# Patient Record
Sex: Female | Born: 1945 | Race: Asian | Hispanic: No | Smoking: Never smoker
Health system: Southern US, Community
[De-identification: ages and names within clinical notes are randomized; demographics above are authoritative.]

## PROBLEM LIST (undated history)

## (undated) DIAGNOSIS — R338 Other retention of urine: Secondary | ICD-10-CM

## (undated) DIAGNOSIS — Z794 Long term (current) use of insulin: Secondary | ICD-10-CM

## (undated) DIAGNOSIS — I10 Essential (primary) hypertension: Secondary | ICD-10-CM

## (undated) DIAGNOSIS — E119 Type 2 diabetes mellitus without complications: Secondary | ICD-10-CM

## (undated) DIAGNOSIS — E669 Obesity, unspecified: Secondary | ICD-10-CM

## (undated) DIAGNOSIS — J302 Other seasonal allergic rhinitis: Secondary | ICD-10-CM

## (undated) DIAGNOSIS — S12690A Other displaced fracture of seventh cervical vertebra, initial encounter for closed fracture: Secondary | ICD-10-CM

## (undated) DIAGNOSIS — K5792 Diverticulitis of intestine, part unspecified, without perforation or abscess without bleeding: Secondary | ICD-10-CM

## (undated) DIAGNOSIS — J45909 Unspecified asthma, uncomplicated: Secondary | ICD-10-CM

## (undated) DIAGNOSIS — E1140 Type 2 diabetes mellitus with diabetic neuropathy, unspecified: Secondary | ICD-10-CM

## (undated) DIAGNOSIS — M1990 Unspecified osteoarthritis, unspecified site: Secondary | ICD-10-CM

## (undated) HISTORY — PX: NO PAST SURGERIES: SHX2092

---

## 1998-10-23 ENCOUNTER — Ambulatory Visit (HOSPITAL_COMMUNITY): Admission: RE | Admit: 1998-10-23 | Discharge: 1998-10-23 | Payer: Self-pay | Admitting: Internal Medicine

## 2003-10-31 ENCOUNTER — Ambulatory Visit: Payer: Self-pay | Admitting: *Deleted

## 2003-10-31 ENCOUNTER — Ambulatory Visit: Payer: Self-pay | Admitting: Nurse Practitioner

## 2004-12-03 ENCOUNTER — Ambulatory Visit: Payer: Self-pay | Admitting: Nurse Practitioner

## 2005-03-06 ENCOUNTER — Ambulatory Visit: Payer: Self-pay | Admitting: Nurse Practitioner

## 2005-03-11 ENCOUNTER — Ambulatory Visit: Payer: Self-pay | Admitting: Nurse Practitioner

## 2005-03-20 ENCOUNTER — Ambulatory Visit (HOSPITAL_COMMUNITY): Admission: RE | Admit: 2005-03-20 | Discharge: 2005-03-20 | Payer: Self-pay | Admitting: Internal Medicine

## 2005-03-25 ENCOUNTER — Ambulatory Visit: Payer: Self-pay | Admitting: Nurse Practitioner

## 2005-04-03 ENCOUNTER — Ambulatory Visit (HOSPITAL_COMMUNITY): Admission: RE | Admit: 2005-04-03 | Discharge: 2005-04-03 | Payer: Self-pay | Admitting: Family Medicine

## 2005-10-14 ENCOUNTER — Ambulatory Visit: Payer: Self-pay | Admitting: Nurse Practitioner

## 2005-11-07 ENCOUNTER — Encounter (INDEPENDENT_AMBULATORY_CARE_PROVIDER_SITE_OTHER): Payer: Self-pay | Admitting: *Deleted

## 2005-11-07 ENCOUNTER — Other Ambulatory Visit: Admission: RE | Admit: 2005-11-07 | Discharge: 2005-11-07 | Payer: Self-pay | Admitting: Nurse Practitioner

## 2005-11-07 ENCOUNTER — Ambulatory Visit: Payer: Self-pay | Admitting: Nurse Practitioner

## 2005-11-10 ENCOUNTER — Ambulatory Visit (HOSPITAL_COMMUNITY): Admission: RE | Admit: 2005-11-10 | Discharge: 2005-11-10 | Payer: Self-pay | Admitting: Internal Medicine

## 2005-11-19 ENCOUNTER — Ambulatory Visit: Payer: Self-pay | Admitting: Nurse Practitioner

## 2006-01-23 ENCOUNTER — Encounter: Admission: RE | Admit: 2006-01-23 | Discharge: 2006-01-23 | Payer: Self-pay | Admitting: Surgery

## 2006-02-02 ENCOUNTER — Ambulatory Visit (HOSPITAL_COMMUNITY): Admission: RE | Admit: 2006-02-02 | Discharge: 2006-02-02 | Payer: Self-pay | Admitting: Urology

## 2006-02-05 ENCOUNTER — Ambulatory Visit (HOSPITAL_COMMUNITY): Admission: RE | Admit: 2006-02-05 | Discharge: 2006-02-05 | Payer: Self-pay | Admitting: Urology

## 2006-05-15 ENCOUNTER — Ambulatory Visit: Payer: Self-pay | Admitting: Nurse Practitioner

## 2007-01-29 ENCOUNTER — Ambulatory Visit: Payer: Self-pay | Admitting: Nurse Practitioner

## 2007-01-29 LAB — CONVERTED CEMR LAB
ALT: 18 units/L (ref 0–35)
AST: 23 units/L (ref 0–37)
Alkaline Phosphatase: 74 units/L (ref 39–117)
BUN: 26 mg/dL — ABNORMAL HIGH (ref 6–23)
CO2: 28 meq/L (ref 19–32)
Calcium: 9.7 mg/dL (ref 8.4–10.5)
Chloride: 98 meq/L (ref 96–112)
Cholesterol: 193 mg/dL (ref 0–200)
Creatinine, Ser: 1.06 mg/dL (ref 0.40–1.20)
Eosinophils Relative: 15 % — ABNORMAL HIGH (ref 0–5)
MCV: 88.8 fL (ref 78.0–100.0)
Neutro Abs: 6 10*3/uL (ref 1.7–7.7)
Platelets: 268 10*3/uL (ref 150–400)
Total CHOL/HDL Ratio: 4
Total Protein: 9.2 g/dL — ABNORMAL HIGH (ref 6.0–8.3)
Triglycerides: 204 mg/dL — ABNORMAL HIGH (ref <150)

## 2007-05-28 ENCOUNTER — Emergency Department (HOSPITAL_COMMUNITY): Admission: EM | Admit: 2007-05-28 | Discharge: 2007-05-28 | Payer: Self-pay | Admitting: Emergency Medicine

## 2007-06-13 ENCOUNTER — Ambulatory Visit: Payer: Self-pay | Admitting: Internal Medicine

## 2007-06-13 ENCOUNTER — Inpatient Hospital Stay (HOSPITAL_COMMUNITY): Admission: EM | Admit: 2007-06-13 | Discharge: 2007-06-17 | Payer: Self-pay | Admitting: Emergency Medicine

## 2007-07-21 ENCOUNTER — Ambulatory Visit: Payer: Self-pay | Admitting: Internal Medicine

## 2007-08-22 ENCOUNTER — Emergency Department (HOSPITAL_COMMUNITY): Admission: EM | Admit: 2007-08-22 | Discharge: 2007-08-22 | Payer: Self-pay | Admitting: Emergency Medicine

## 2007-09-17 ENCOUNTER — Ambulatory Visit: Payer: Self-pay | Admitting: Internal Medicine

## 2007-09-17 DIAGNOSIS — J45909 Unspecified asthma, uncomplicated: Secondary | ICD-10-CM | POA: Insufficient documentation

## 2007-09-17 DIAGNOSIS — I10 Essential (primary) hypertension: Secondary | ICD-10-CM | POA: Insufficient documentation

## 2007-11-24 ENCOUNTER — Encounter (INDEPENDENT_AMBULATORY_CARE_PROVIDER_SITE_OTHER): Payer: Self-pay | Admitting: Internal Medicine

## 2007-11-24 ENCOUNTER — Ambulatory Visit: Payer: Self-pay | Admitting: Internal Medicine

## 2007-11-24 LAB — CONVERTED CEMR LAB
ALT: 14 units/L (ref 0–35)
Alkaline Phosphatase: 87 units/L (ref 39–117)
BUN: 25 mg/dL — ABNORMAL HIGH (ref 6–23)
CO2: 24 meq/L (ref 19–32)
Calcium: 9.5 mg/dL (ref 8.4–10.5)
Chloride: 99 meq/L (ref 96–112)
Creatinine, Ser: 0.95 mg/dL (ref 0.40–1.20)
Glucose, Bld: 277 mg/dL — ABNORMAL HIGH (ref 70–99)
Potassium: 3.9 meq/L (ref 3.5–5.3)
Sodium: 138 meq/L (ref 135–145)
Total Bilirubin: 0.4 mg/dL (ref 0.3–1.2)
Total Protein: 8.2 g/dL (ref 6.0–8.3)

## 2007-12-28 ENCOUNTER — Ambulatory Visit: Payer: Self-pay | Admitting: Internal Medicine

## 2008-03-16 ENCOUNTER — Emergency Department (HOSPITAL_COMMUNITY): Admission: EM | Admit: 2008-03-16 | Discharge: 2008-03-17 | Payer: Self-pay | Admitting: Emergency Medicine

## 2008-03-30 ENCOUNTER — Ambulatory Visit: Payer: Self-pay | Admitting: Internal Medicine

## 2008-04-26 ENCOUNTER — Ambulatory Visit: Payer: Self-pay | Admitting: Internal Medicine

## 2008-06-26 ENCOUNTER — Ambulatory Visit: Payer: Self-pay | Admitting: Internal Medicine

## 2008-06-26 ENCOUNTER — Encounter (INDEPENDENT_AMBULATORY_CARE_PROVIDER_SITE_OTHER): Payer: Self-pay | Admitting: Internal Medicine

## 2008-06-26 LAB — CONVERTED CEMR LAB
HDL: 40 mg/dL (ref >39)
Triglycerides: 415 mg/dL — ABNORMAL HIGH (ref <150)

## 2008-08-05 ENCOUNTER — Emergency Department (HOSPITAL_COMMUNITY): Admission: EM | Admit: 2008-08-05 | Discharge: 2008-08-05 | Payer: Self-pay | Admitting: Emergency Medicine

## 2008-10-06 ENCOUNTER — Encounter (INDEPENDENT_AMBULATORY_CARE_PROVIDER_SITE_OTHER): Payer: Self-pay | Admitting: Internal Medicine

## 2008-10-06 ENCOUNTER — Ambulatory Visit: Payer: Self-pay | Admitting: Internal Medicine

## 2008-10-06 LAB — CONVERTED CEMR LAB
AST: 20 units/L (ref 0–37)
Albumin: 4.1 g/dL (ref 3.5–5.2)
Chloride: 107 meq/L (ref 96–112)
Creatinine, Ser: 1.12 mg/dL (ref 0.40–1.20)
Hemoglobin: 11.9 g/dL — ABNORMAL LOW (ref 12.0–15.0)
Lymphs Abs: 2.9 10*3/uL (ref 0.7–4.0)
MCV: 90.1 fL (ref 78.0–100.0)
Neutrophils Relative %: 52 % (ref 43–77)

## 2008-11-27 ENCOUNTER — Ambulatory Visit: Payer: Self-pay | Admitting: Family Medicine

## 2009-01-04 ENCOUNTER — Ambulatory Visit: Payer: Self-pay | Admitting: Internal Medicine

## 2009-04-02 ENCOUNTER — Ambulatory Visit: Payer: Self-pay | Admitting: Internal Medicine

## 2009-05-31 ENCOUNTER — Ambulatory Visit: Payer: Self-pay | Admitting: Internal Medicine

## 2009-09-20 ENCOUNTER — Emergency Department (HOSPITAL_COMMUNITY): Admission: EM | Admit: 2009-09-20 | Discharge: 2009-09-20 | Payer: Self-pay | Admitting: Emergency Medicine

## 2009-10-01 ENCOUNTER — Emergency Department (HOSPITAL_COMMUNITY): Admission: EM | Admit: 2009-10-01 | Discharge: 2009-10-01 | Payer: Self-pay | Admitting: Emergency Medicine

## 2010-01-10 ENCOUNTER — Encounter (INDEPENDENT_AMBULATORY_CARE_PROVIDER_SITE_OTHER): Payer: Self-pay | Admitting: *Deleted

## 2010-01-10 LAB — CONVERTED CEMR LAB
AST: 20 units/L (ref 0–37)
Albumin: 4.9 g/dL (ref 3.5–5.2)
Alkaline Phosphatase: 59 units/L (ref 39–117)
BUN: 31 mg/dL — ABNORMAL HIGH (ref 6–23)
CO2: 27 meq/L (ref 19–32)
Creatinine, Ser: 1.17 mg/dL (ref 0.40–1.20)
Glucose, Bld: 114 mg/dL — ABNORMAL HIGH (ref 70–99)
Sodium: 140 meq/L (ref 135–145)
Total Bilirubin: 0.4 mg/dL (ref 0.3–1.2)
Total Protein: 8.3 g/dL (ref 6.0–8.3)

## 2010-01-27 ENCOUNTER — Encounter: Payer: Self-pay | Admitting: Internal Medicine

## 2010-03-20 ENCOUNTER — Emergency Department (HOSPITAL_COMMUNITY)
Admission: EM | Admit: 2010-03-20 | Discharge: 2010-03-20 | Disposition: A | Discharge status: Home / Self Care | Payer: Medicare Other | Attending: Emergency Medicine | Admitting: Emergency Medicine

## 2010-03-20 ENCOUNTER — Emergency Department (HOSPITAL_COMMUNITY): Payer: Medicare Other

## 2010-03-20 DIAGNOSIS — J45909 Unspecified asthma, uncomplicated: Secondary | ICD-10-CM | POA: Insufficient documentation

## 2010-03-20 DIAGNOSIS — R0789 Other chest pain: Secondary | ICD-10-CM | POA: Insufficient documentation

## 2010-03-20 DIAGNOSIS — R0989 Other specified symptoms and signs involving the circulatory and respiratory systems: Secondary | ICD-10-CM | POA: Insufficient documentation

## 2010-03-20 DIAGNOSIS — R05 Cough: Secondary | ICD-10-CM | POA: Insufficient documentation

## 2010-03-20 DIAGNOSIS — K219 Gastro-esophageal reflux disease without esophagitis: Secondary | ICD-10-CM | POA: Insufficient documentation

## 2010-03-20 DIAGNOSIS — J40 Bronchitis, not specified as acute or chronic: Secondary | ICD-10-CM | POA: Insufficient documentation

## 2010-03-20 DIAGNOSIS — E119 Type 2 diabetes mellitus without complications: Secondary | ICD-10-CM | POA: Insufficient documentation

## 2010-03-20 DIAGNOSIS — I498 Other specified cardiac arrhythmias: Secondary | ICD-10-CM | POA: Insufficient documentation

## 2010-03-20 DIAGNOSIS — R0609 Other forms of dyspnea: Secondary | ICD-10-CM | POA: Insufficient documentation

## 2010-03-20 DIAGNOSIS — R059 Cough, unspecified: Secondary | ICD-10-CM | POA: Insufficient documentation

## 2010-03-20 DIAGNOSIS — R5383 Other fatigue: Secondary | ICD-10-CM | POA: Insufficient documentation

## 2010-03-20 DIAGNOSIS — J3489 Other specified disorders of nose and nasal sinuses: Secondary | ICD-10-CM | POA: Insufficient documentation

## 2010-03-20 DIAGNOSIS — I10 Essential (primary) hypertension: Secondary | ICD-10-CM | POA: Insufficient documentation

## 2010-03-20 DIAGNOSIS — R5381 Other malaise: Secondary | ICD-10-CM | POA: Insufficient documentation

## 2010-03-20 LAB — CBC
Hemoglobin: 12.1 g/dL (ref 12.0–15.0)
MCH: 29.4 pg (ref 26.0–34.0)
MCHC: 33.9 g/dL (ref 30.0–36.0)

## 2010-03-20 LAB — POCT CARDIAC MARKERS
CKMB, poc: 1.6 ng/mL (ref 1.0–8.0)
Myoglobin, poc: 231 ng/mL (ref 12–200)

## 2010-03-20 LAB — POCT I-STAT 3, VENOUS BLOOD GAS (G3P V): pCO2, Ven: 49.9 mmHg (ref 45.0–50.0)

## 2010-03-20 LAB — BRAIN NATRIURETIC PEPTIDE: Pro B Natriuretic peptide (BNP): <30 pg/mL (ref 0.0–100.0)

## 2010-03-20 LAB — POCT I-STAT, CHEM 8
BUN: 38 mg/dL — ABNORMAL HIGH (ref 6–23)
Calcium, Ion: 1.11 mmol/L — ABNORMAL LOW (ref 1.12–1.32)
Chloride: 103 mEq/L (ref 96–112)
HCT: 38 % (ref 36.0–46.0)
Hemoglobin: 12.9 g/dL (ref 12.0–15.0)
Potassium: 4.4 mEq/L (ref 3.5–5.1)
Sodium: 139 mEq/L (ref 135–145)

## 2010-03-20 LAB — DIFFERENTIAL
Eosinophils Relative: 14 % — ABNORMAL HIGH (ref 0–5)
Monocytes Absolute: 0.7 10*3/uL (ref 0.1–1.0)
Monocytes Relative: 6 % (ref 3–12)
Neutro Abs: 4.8 10*3/uL (ref 1.7–7.7)

## 2010-03-21 LAB — BASIC METABOLIC PANEL
BUN: 19 mg/dL (ref 6–23)
Calcium: 9.3 mg/dL (ref 8.4–10.5)
Creatinine, Ser: 1.25 mg/dL — ABNORMAL HIGH (ref 0.4–1.2)
GFR calc non Af Amer: 43 mL/min — ABNORMAL LOW (ref >60)
Glucose, Bld: 171 mg/dL — ABNORMAL HIGH (ref 70–99)
Sodium: 139 mEq/L (ref 135–145)

## 2010-03-21 LAB — CBC
MCH: 30.2 pg (ref 26.0–34.0)
MCHC: 34.9 g/dL (ref 30.0–36.0)
Platelets: 209 10*3/uL (ref 150–400)
RDW: 11.9 % (ref 11.5–15.5)

## 2010-03-21 LAB — URINE CULTURE
Colony Count: 9000
Culture  Setup Time: 201109151420

## 2010-03-21 LAB — POCT I-STAT 3, VENOUS BLOOD GAS (G3P V)
Bicarbonate: 30.1 mEq/L — ABNORMAL HIGH (ref 20.0–24.0)
TCO2: 32 mmol/L (ref 0–100)
pH, Ven: 7.375 — ABNORMAL HIGH (ref 7.250–7.300)

## 2010-03-21 LAB — URINALYSIS, ROUTINE W REFLEX MICROSCOPIC
Bilirubin Urine: NEGATIVE
Glucose, UA: 100 mg/dL — AB
Hgb urine dipstick: NEGATIVE
Specific Gravity, Urine: 1.013 (ref 1.005–1.030)
Urobilinogen, UA: 0.2 mg/dL (ref 0.0–1.0)

## 2010-03-21 LAB — POCT CARDIAC MARKERS

## 2010-03-21 LAB — DIFFERENTIAL
Basophils Absolute: 0.1 10*3/uL (ref 0.0–0.1)
Basophils Relative: 1 % (ref 0–1)
Eosinophils Absolute: 1.3 10*3/uL — ABNORMAL HIGH (ref 0.0–0.7)
Neutro Abs: 5.2 10*3/uL (ref 1.7–7.7)
Neutrophils Relative %: 51 % (ref 43–77)

## 2010-03-21 LAB — LACTIC ACID, PLASMA: Lactic Acid, Venous: 1.5 mmol/L (ref 0.5–2.2)

## 2010-03-24 ENCOUNTER — Emergency Department (HOSPITAL_COMMUNITY): Payer: Medicare Other

## 2010-03-24 ENCOUNTER — Emergency Department (HOSPITAL_COMMUNITY)
Admission: EM | Admit: 2010-03-24 | Discharge: 2010-03-24 | Disposition: A | Discharge status: Home / Self Care | Payer: Medicare Other | Attending: Emergency Medicine | Admitting: Emergency Medicine

## 2010-03-24 DIAGNOSIS — J45909 Unspecified asthma, uncomplicated: Secondary | ICD-10-CM | POA: Insufficient documentation

## 2010-03-24 DIAGNOSIS — E119 Type 2 diabetes mellitus without complications: Secondary | ICD-10-CM | POA: Insufficient documentation

## 2010-03-24 DIAGNOSIS — R05 Cough: Secondary | ICD-10-CM | POA: Insufficient documentation

## 2010-03-24 DIAGNOSIS — I10 Essential (primary) hypertension: Secondary | ICD-10-CM | POA: Insufficient documentation

## 2010-03-24 DIAGNOSIS — R059 Cough, unspecified: Secondary | ICD-10-CM | POA: Insufficient documentation

## 2010-03-24 DIAGNOSIS — J40 Bronchitis, not specified as acute or chronic: Secondary | ICD-10-CM | POA: Insufficient documentation

## 2010-03-24 DIAGNOSIS — J189 Pneumonia, unspecified organism: Secondary | ICD-10-CM | POA: Insufficient documentation

## 2010-03-24 DIAGNOSIS — K219 Gastro-esophageal reflux disease without esophagitis: Secondary | ICD-10-CM | POA: Insufficient documentation

## 2010-03-24 DIAGNOSIS — R0602 Shortness of breath: Secondary | ICD-10-CM | POA: Insufficient documentation

## 2010-03-31 ENCOUNTER — Emergency Department (HOSPITAL_COMMUNITY)
Admission: EM | Admit: 2010-03-31 | Discharge: 2010-03-31 | Disposition: A | Discharge status: Home / Self Care | Payer: Medicare Other | Attending: Emergency Medicine | Admitting: Emergency Medicine

## 2010-03-31 ENCOUNTER — Emergency Department (HOSPITAL_COMMUNITY): Payer: Medicare Other

## 2010-03-31 DIAGNOSIS — R42 Dizziness and giddiness: Secondary | ICD-10-CM | POA: Insufficient documentation

## 2010-03-31 DIAGNOSIS — K219 Gastro-esophageal reflux disease without esophagitis: Secondary | ICD-10-CM | POA: Insufficient documentation

## 2010-03-31 DIAGNOSIS — R413 Other amnesia: Secondary | ICD-10-CM | POA: Insufficient documentation

## 2010-03-31 DIAGNOSIS — Z79899 Other long term (current) drug therapy: Secondary | ICD-10-CM | POA: Insufficient documentation

## 2010-03-31 DIAGNOSIS — R0602 Shortness of breath: Secondary | ICD-10-CM | POA: Insufficient documentation

## 2010-03-31 DIAGNOSIS — G8929 Other chronic pain: Secondary | ICD-10-CM | POA: Insufficient documentation

## 2010-03-31 DIAGNOSIS — F0390 Unspecified dementia without behavioral disturbance: Secondary | ICD-10-CM | POA: Insufficient documentation

## 2010-03-31 DIAGNOSIS — R05 Cough: Secondary | ICD-10-CM | POA: Insufficient documentation

## 2010-03-31 DIAGNOSIS — F29 Unspecified psychosis not due to a substance or known physiological condition: Secondary | ICD-10-CM | POA: Insufficient documentation

## 2010-03-31 DIAGNOSIS — J45909 Unspecified asthma, uncomplicated: Secondary | ICD-10-CM | POA: Insufficient documentation

## 2010-03-31 DIAGNOSIS — I10 Essential (primary) hypertension: Secondary | ICD-10-CM | POA: Insufficient documentation

## 2010-03-31 DIAGNOSIS — N289 Disorder of kidney and ureter, unspecified: Secondary | ICD-10-CM | POA: Insufficient documentation

## 2010-03-31 DIAGNOSIS — R0609 Other forms of dyspnea: Secondary | ICD-10-CM | POA: Insufficient documentation

## 2010-03-31 DIAGNOSIS — E119 Type 2 diabetes mellitus without complications: Secondary | ICD-10-CM | POA: Insufficient documentation

## 2010-03-31 DIAGNOSIS — R0989 Other specified symptoms and signs involving the circulatory and respiratory systems: Secondary | ICD-10-CM | POA: Insufficient documentation

## 2010-03-31 DIAGNOSIS — R059 Cough, unspecified: Secondary | ICD-10-CM | POA: Insufficient documentation

## 2010-03-31 LAB — BASIC METABOLIC PANEL
BUN: 21 mg/dL (ref 6–23)
CO2: 25 mEq/L (ref 19–32)
GFR calc non Af Amer: 28 mL/min — ABNORMAL LOW (ref >60)
Glucose, Bld: 390 mg/dL — ABNORMAL HIGH (ref 70–99)
Potassium: 4.4 mEq/L (ref 3.5–5.1)
Sodium: 134 mEq/L — ABNORMAL LOW (ref 135–145)

## 2010-03-31 LAB — DIFFERENTIAL
Basophils Absolute: 0.1 10*3/uL (ref 0.0–0.1)
Eosinophils Relative: 8 % — ABNORMAL HIGH (ref 0–5)
Lymphocytes Relative: 25 % (ref 12–46)
Lymphs Abs: 2.6 10*3/uL (ref 0.7–4.0)
Monocytes Absolute: 0.6 10*3/uL (ref 0.1–1.0)
Neutro Abs: 6.3 10*3/uL (ref 1.7–7.7)

## 2010-03-31 LAB — CBC
HCT: 37.8 % (ref 36.0–46.0)
Hemoglobin: 13.5 g/dL (ref 12.0–15.0)
MCV: 84.4 fL (ref 78.0–100.0)
RDW: 11.6 % (ref 11.5–15.5)
WBC: 10.6 10*3/uL — ABNORMAL HIGH (ref 4.0–10.5)

## 2010-04-14 LAB — URINALYSIS, ROUTINE W REFLEX MICROSCOPIC
Bilirubin Urine: NEGATIVE
Glucose, UA: NEGATIVE mg/dL
Hgb urine dipstick: NEGATIVE
Ketones, ur: NEGATIVE mg/dL
Protein, ur: NEGATIVE mg/dL
Urobilinogen, UA: 0.2 mg/dL (ref 0.0–1.0)

## 2010-04-14 LAB — CBC
HCT: 32.8 % — ABNORMAL LOW (ref 36.0–46.0)
Hemoglobin: 11.4 g/dL — ABNORMAL LOW (ref 12.0–15.0)
MCHC: 34.7 g/dL (ref 30.0–36.0)
MCV: 89.4 fL (ref 78.0–100.0)
RBC: 3.66 MIL/uL — ABNORMAL LOW (ref 3.87–5.11)
WBC: 7.5 10*3/uL (ref 4.0–10.5)

## 2010-04-14 LAB — POCT I-STAT, CHEM 8
BUN: 47 mg/dL — ABNORMAL HIGH (ref 6–23)
Calcium, Ion: 1.13 mmol/L (ref 1.12–1.32)
Creatinine, Ser: 2.5 mg/dL — ABNORMAL HIGH (ref 0.4–1.2)
Glucose, Bld: 182 mg/dL — ABNORMAL HIGH (ref 70–99)
Hemoglobin: 11.2 g/dL — ABNORMAL LOW (ref 12.0–15.0)
TCO2: 19 mmol/L (ref 0–100)

## 2010-04-14 LAB — URINE MICROSCOPIC-ADD ON

## 2010-04-14 LAB — CALCIUM: Calcium: 8.9 mg/dL (ref 8.4–10.5)

## 2010-04-18 LAB — POCT CARDIAC MARKERS
CKMB, poc: 1.5 ng/mL (ref 1.0–8.0)
Myoglobin, poc: 159 ng/mL (ref 12–200)
Myoglobin, poc: 210 ng/mL (ref 12–200)

## 2010-04-18 LAB — COMPREHENSIVE METABOLIC PANEL
BUN: 26 mg/dL — ABNORMAL HIGH (ref 6–23)
Calcium: 9.6 mg/dL (ref 8.4–10.5)
Glucose, Bld: 100 mg/dL — ABNORMAL HIGH (ref 70–99)
Sodium: 138 mEq/L (ref 135–145)
Total Protein: 8.5 g/dL — ABNORMAL HIGH (ref 6.0–8.3)

## 2010-04-18 LAB — POCT I-STAT, CHEM 8
BUN: 28 mg/dL — ABNORMAL HIGH (ref 6–23)
Creatinine, Ser: 1.2 mg/dL (ref 0.4–1.2)
Potassium: 3.7 mEq/L (ref 3.5–5.1)
Sodium: 141 mEq/L (ref 135–145)

## 2010-04-18 LAB — DIFFERENTIAL
Lymphocytes Relative: 33 % (ref 12–46)
Lymphs Abs: 3.4 10*3/uL (ref 0.7–4.0)
Monocytes Relative: 5 % (ref 3–12)
Neutro Abs: 5.7 10*3/uL (ref 1.7–7.7)
Neutrophils Relative %: 54 % (ref 43–77)

## 2010-04-18 LAB — CBC
Hemoglobin: 12.9 g/dL (ref 12.0–15.0)
MCHC: 35.2 g/dL (ref 30.0–36.0)
RDW: 12.6 % (ref 11.5–15.5)

## 2010-04-18 LAB — GLUCOSE, CAPILLARY

## 2010-05-13 ENCOUNTER — Observation Stay (HOSPITAL_COMMUNITY)
Admission: EM | Admit: 2010-05-13 | Discharge: 2010-05-13 | Disposition: A | Discharge status: Home / Self Care | Payer: Medicare Other | Attending: Emergency Medicine | Admitting: Emergency Medicine

## 2010-05-13 ENCOUNTER — Emergency Department (HOSPITAL_COMMUNITY): Payer: Medicare Other

## 2010-05-13 DIAGNOSIS — R7309 Other abnormal glucose: Secondary | ICD-10-CM | POA: Insufficient documentation

## 2010-05-13 DIAGNOSIS — J4489 Other specified chronic obstructive pulmonary disease: Principal | ICD-10-CM | POA: Insufficient documentation

## 2010-05-13 DIAGNOSIS — R062 Wheezing: Secondary | ICD-10-CM | POA: Insufficient documentation

## 2010-05-13 DIAGNOSIS — J449 Chronic obstructive pulmonary disease, unspecified: Secondary | ICD-10-CM | POA: Insufficient documentation

## 2010-05-13 LAB — GLUCOSE, CAPILLARY
Glucose-Capillary: 507 mg/dL — ABNORMAL HIGH (ref 70–99)
Glucose-Capillary: 468 mg/dL — ABNORMAL HIGH (ref 70–99)
Glucose-Capillary: 466 mg/dL — ABNORMAL HIGH (ref 70–99)
Glucose-Capillary: 462 mg/dL — ABNORMAL HIGH (ref 70–99)
Glucose-Capillary: 364 mg/dL — ABNORMAL HIGH (ref 70–99)
Glucose-Capillary: 233 mg/dL — ABNORMAL HIGH (ref 70–99)

## 2010-05-13 LAB — POCT I-STAT, CHEM 8
BUN: 19 mg/dL (ref 6–23)
Creatinine, Ser: 1.4 mg/dL — ABNORMAL HIGH (ref 0.4–1.2)
Glucose, Bld: 304 mg/dL — ABNORMAL HIGH (ref 70–99)
Hemoglobin: 12.6 g/dL (ref 12.0–15.0)
Potassium: 3.7 mEq/L (ref 3.5–5.1)
TCO2: 25 mmol/L (ref 0–100)

## 2010-05-13 LAB — CBC
HCT: 34.4 % — ABNORMAL LOW (ref 36.0–46.0)
MCH: 29.8 pg (ref 26.0–34.0)
MCHC: 34.6 g/dL (ref 30.0–36.0)
RDW: 12.1 % (ref 11.5–15.5)

## 2010-05-13 LAB — DIFFERENTIAL
Basophils Absolute: 0.1 10*3/uL (ref 0.0–0.1)
Eosinophils Relative: 11 % — ABNORMAL HIGH (ref 0–5)
Lymphocytes Relative: 24 % (ref 12–46)
Monocytes Absolute: 0.4 10*3/uL (ref 0.1–1.0)
Monocytes Relative: 5 % (ref 3–12)

## 2010-05-21 NOTE — H&P (Signed)
NAME:  Nichole Munoz, Nichole Munoz NO.:  [id number]   MEDICAL RECORD NO.:  [id number]          PATIENT TYPE:  INP   LOCATION:  5507                         [facility]  [facility]   PHYSICIAN:  Jana Hakim, M.D. DATE OF BIRTH:  Nov 23, 1945   DATE OF ADMISSION:  06/13/2007  DATE OF DISCHARGE:                              HISTORY & PHYSICAL   PRIMARY CARE PHYSICIAN:  [facility].   CHIEF COMPLAINT:  Coughing up blood.   HISTORY OF PRESENT ILLNESS:  This is a 65 year old female presenting to  the emergency department with complaints of cough, shortness of breath  and hemoptysis for the past 4-6 months.  The patient's son in the  bedside and assists with giving the medical history.  The patient  reports having weight loss, fevers, chills and night sweats.  The  patient denies having any previous history of tuberculosis.  The patient  has lived in the [place] since 1990 and originally is from  [place].   PAST MEDICAL HISTORY:  Significant for:  1. Diabetes type 2.  2. Gastroesophageal reflux disease.  3. Hypertension.  4. Neuropathy.   MEDICATIONS AT THIS TIME:  Include metformin, Naprosyn, omeprazole,  lorazepam, hydrochlorothiazide and butalbital/acetaminophen.   ALLERGIES:  SHE HAS NO KNOWN DRUG ALLERGIES.   SOCIAL HISTORY:  The patient lives with relatives.  She is a nonsmoker,  nondrinker.   FAMILY HISTORY:  Noncontributory.   REVIEW OF SYSTEMS:  Pertinents are mentioned above.  The patient has  also had decreased appetite.  She has not had any nausea, vomiting,  diarrhea or chest pain.  She has not had dizziness or syncope.   PHYSICAL EXAMINATION:  GENERAL:  This is a 65 year old well-nourished,  well-developed female in no acute distress.  VITAL SIGNS: Temperature of 100.4, blood pressure 154/90, heart rate 80,  respirations 16, O2 saturation 100%.  HEENT:  Normocephalic, atraumatic.  Pupils are equally round and  reactive to light.  Extraocular muscles  are intact.  There is no scleral  icterus.  Funduscopic benign.  Oropharynx is clear.  NECK: Supple.  Full range of motion.  No thyromegaly, adenopathy or  jugular venous distention.  MUSCULOSKELETAL:  Chest wall area has numerous erythematous, excoriated  tracks consistent with cultural coining.  CARDIAC:  Regular rate and  rhythm.  No murmurs, gallops or rubs.  LUNGS: Clear to auscultation bilaterally.  No rales, rhonchi or wheezes  are appreciated.  ABDOMEN:  Positive bowel sounds.  Soft, nontender and nondistended.  EXTREMITIES: Without cyanosis, clubbing or edema.  NEUROLOGIC:  Examination nonfocal.   LABORATORY STUDIES:  White blood cell count 13.0, hemoglobin 12.6,  hematocrit 35.6, platelets 244,000, neutrophils 62%, lymphocytes 21%,  monocytes 5%, eosinophils 12%.  Sodium 136, potassium 3.3, chloride 100,  carbon dioxide 26, BUN 22, creatinine 1.02 and glucose 374.  LDH 152.   Chest x-ray reveals no acute cardiopulmonary disease findings.   ASSESSMENT:  [age]year-old female being admitted with:  1. Shortness of breath.  2. Chronic cough and hemoptysis.  3. Type 2 diabetes mellitus.  4. Hypertension.   PLAN:  The patient  will be admitted to a respiratory isolation area and  be evaluated for possible tuberculosis.  A PPD will be placed and read  in 48 hours.  A TB workup has been started.  Sputum for AFB q.a.m. x3  has been ordered along with sputum for C&S and tuberculosis culture.  However, the patient does have a normal-appearing chest x-ray and the  etiology of her hemoptysis and symptoms may be secondary to other causes  such as neoplastic processes, sarcoidosis, parasitic etiologies, which  are cited because the patient does have an eosinophilia.  The patient  will continue on her hydrochlorothiazide and sliding-scale insulin  coverage has been ordered and potassium replacement therapy.  Metformin  will be held for now.  DVT and GI prophylaxis have also been  ordered and  supplemental oxygen therapy.  Further workup will ensue pending results  of the patient's studies.      Jana Hakim, M.D.  Electronically Signed     HJ/MEDQ  D:  06/13/2007  T:  06/13/2007  Job:  [id number]

## 2010-05-21 NOTE — Discharge Summary (Signed)
NAME:  Nichole Munoz, SPIEKER NO.:  [id number]   MEDICAL RECORD NO.:  [id number]          PATIENT TYPE:  INP   LOCATION:  [id number]                         FACILITY:  [facility]   PHYSICIAN:  Leana Gamer, MDDATE OF BIRTH:  1945-01-18   DATE OF ADMISSION:  06/13/2007  DATE OF DISCHARGE:  06/17/2007                               DISCHARGE SUMMARY   DISCHARGE DISPOSITION:  Home.   FINAL DISCHARGE DIAGNOSIS:  1. Reactive airway disease.  2. Hemoptysis, resolved.  3. Diabetes type 2.  4. Bronchitis.  5. Gastroesophageal reflux disease.  6. Chronic pain.   DISCHARGE MEDICATIONS:  1. Symbicort 2 puffs p.o. b.i.d.  2. Avelox 400 mg p.o. daily x2.  3. Mucinex DM 2 tabs p.o. b.i.d.  4. Potassium chloride 20 mEq p.o. daily.  5. Lorazepam 0.5 mg p.o. at bedtime.  6. Glipizide ER 10 mg p.o. daily.  7. Omeprazole 20 mg p.o. daily.  8. Hydrochlorothiazide 25 mg p.o. daily.  9. Butal/APAP 325 q.4-6 h. p.r.n.  10.Naproxen 500 mg p.o. b.i.d.  11.Advil 200 mg p.o. q.6 h. p.r.n.   CONSULTANTS:  Dr. Melvyn Novas, pulmonology.   PROCEDURES:  None.   Chest x-ray. two view, done on January 13, 2007, which shows no acute  cardiopulmonary disease.  Please note CT angiogram was done on May 28, 2007 and reviewed.  That was negative for PE, mild diffuse ectasia of  the thoracic aorta, negative for acute pulmonary findings.   PERTINENT LABORATORY STUDIES:  The patient had sputum sent for AFB, all  of which were negative x3.  Respiratory culture was sent and shows  normal oropharyngeal flora.  Blood cultures were negative for any growth  to date.   ALLERGIES:  NO KNOWN DRUG ALLERGIES.   CODE STATUS:  Full code.   HEALTH CARE Celese Banner:  [facility].   CHIEF COMPLAINT:  Hemoptysis and dyspnea.   HISTORY OF PRESENT ILLNESS:  Please see the H&P dictated by Dr. Jana Hakim for details of the HPI.  However, this is a 65 year old female  who had been having hemoptysis for the past 4-6  months, who presents  with complaints of cough and shortness of breath.   HOSPITAL COURSE:  In terms of the hemoptysis, the patient was put in  isolation and ruled out for TB with skin test and with sputum for AFB.  Pulmonology was consulted to see the patient, given the extended course  of her symptoms.  They felt that this was not a primarily respiratory  condition.  However, there are several tests are pending, including a P-  ANCA, an ANA, with the consideration for occult alveolar hemorrhage.  The patient's sed rate was checked, and it was elevated at 59.  The  patient currently has no respiratory symptoms.  She has no cough and no  further hemoptysis.  The patient was treated as a bronchitis and treated  with Symbicort as well as antibiotics, and her symptoms appeared to  abate.  However, given her elevated sed rate, it would probably be  beneficial for the patient to follow up  with pulmonology as an  outpatient, and also to follow up on the studies that have already been  done.  I am recommending that the patient follow up with Dr. Melvyn Novas in the  clinic for the further evaluation of her condition.  In terms of her  diabetes, the patient is resumed on her glipizide and should have no  difficulty controlling her blood sugars, given the fact that she is not  on any additional steroids.  Otherwise, all other medical problems  remained stable, and the patient is being discharged home in stable  condition.  Ambulation on room air is 98%, without any oxygen.  The  patient has good exercise tolerance.  She currently has no cough.  She  has no hemoptysis.  The patient is to follow up with her primary care  physician at [facility] within 1-2 weeks.  Recommended that the patient  call Dr. Melvyn Novas at [facility] at [phone].  Schedule an  appointment within 2 weeks.   DIETARY RESTRICTIONS:  The patient should be on a diabetic, low-sodium  diet.   PHYSICAL RESTRICTIONS:  Activity as  tolerated.      Leana Gamer, MD  Electronically Signed     MAM/MEDQ  D:  06/17/2007  T:  06/17/2007  Job:  [id number]

## 2010-05-21 NOTE — Consult Note (Signed)
NAME:  Nichole Munoz, Nichole Munoz NO.:  [id number]   MEDICAL RECORD NO.:  [id number]          PATIENT TYPE:  EMS   LOCATION:  MAJO                         FACILITY:  [facility]   PHYSICIAN:  Karlyn Agee, M.D. DATE OF BIRTH:  May 15, 1945   DATE OF CONSULTATION:  03/17/2008  DATE OF DISCHARGE:                                 CONSULTATION   PRIORITY EMERGENCY ROOM CONSULTATION   REASON FOR CONSULTATION:  Shortness of breath and chest pain.   PRIMARY CARE PHYSICIAN:  [facility].   IMPRESSION:  1. Persistent dry cough, postnasal drip, probably a manifestation of      allergic sinusitis.  2. Chest pain related only to coughing.  3. Patient denies difficulty breathing.  4. ACE inhibitor use possibly aggravating cough.  5. History of reactive airways disease.  6. Diabetes, type 2, fair control.  7. Hypertension, controlled.  8. History of gastroesophageal reflux disease.   RECOMMENDATIONS:  1. Discontinue lisinopril.  2. Flonase nasal spray daily.  3. Zyrtec daily.  4. Substitute Cozaar 50 mg daily for the lisinopril/HCTZ.  5. Patient may be scheduled for an outpatient stress test through the      emergency room or through her [facility].  6. We will recommend doubling the dose of her Prilosec if her cough      persists.   HISTORY OF PRESENT ILLNESS:  This 65 year old [place] lady is very  ebullient and jovial.  The history is nevertheless very difficult even  with an interpreter.  Her son was here initially, but after being  advised that she was going to be admitted her son left and interpreting  services were then done via a telephone interpreter.  Although the  Hospitalist Service was called to admit this patient for shortness of  breath, which was felt may have been due to angina equivalent, patient  complains of persistent dry cough now for 1 week associated with a  postnasal drip and does admit to a history of sinusitis.  She denies  chest pain outside of  coughing.  She denies any difficulty breathing at  rest.  She denies any nausea, diaphoresis, or syncope.  She denies any  headaches, any weight loss.  She denies any fever.   PAST MEDICAL HISTORY:  1. Hypertension.  2. Diabetes, type 2.  3. Asthma.  4. GERD.   MEDICATIONS:  Include:  1. Albuterol inhaler.  2. Lisinopril/HCTZ 20/12.5.  3. Hydrochlorothiazide 25 mg daily making a total of 37.5 mg of HCTZ.  4. Glipizide 2.5 mg daily.  5. Metformin 100 mg twice daily.   ALLERGIES:  No known drug allergies.   SOCIAL HISTORY:  Denies tobacco, alcohol, or illicit drug use.   FAMILY HISTORY:  No family history of similar problems.   REVIEW OF SYSTEMS:  Other than noted above, a 10-point review of systems  is unremarkable.   PHYSICAL EXAMINATION:  GENERAL:  A very pleasant and healthy-looking,  middle-aged, [place] lady sitting up in bed coughing frequently such  that the history and interpretation is difficult because it is  frequently interrupted by  a dry hacking cough.  VITAL SIGNS:  Her temperature is 98.8, pulse 97, respirations 20, blood  pressure 130/73, she is saturating at 93% on room air.  HEENT:  Pupils are round and equal, mucous membranes are pink and  anicteric.  Her oropharynx is not inflamed, but it is moist.  NECK:  She has no cervical lymphadenopathy or thyromegaly.  CHEST:  Clear to auscultation bilaterally.  ABDOMEN:  Obese, soft, and nontender.  EXTREMITIES:  Without edema.  She has no bone or joint deformities.  CENTRAL NERVOUS SYSTEM:  Cranial nerves II-XII are grossly intact, and  she has no focal neurologic deficit.   LABORATORY DATA:  Her CBC is reviewed and is completely normal.  Her  serum chemistry is reviewed and is likewise essentially normal.  Her BUN-  creatinine ratio is elevated at 26/1.02, and her total bilirubin is  slightly elevated at 1.6, and is otherwise unremarkable.  Her EKG shows  a normal sinus rhythm.  Her chest x-ray shows clear  lungs with no acute  abnormalities.      Karlyn Agee, M.D.  Electronically Signed     LC/MEDQ  D:  03/17/2008  T:  03/17/2008  Job:  [id number]   cc:   April Palumbo-Rasch, MD  [facility]

## 2010-05-21 NOTE — Consult Note (Signed)
NAME:  Nichole Munoz, Nichole Munoz                   ACCOUNT NO.:  [id number]   MEDICAL RECORD NO.:  [id number]          PATIENT TYPE:  INP   LOCATION:  5507                         FACILITY:  [place]   PHYSICIAN:  Legrand Como B. Melvyn Novas, MD, [place] OF BIRTH:  08-30-1945   DATE OF CONSULTATION:  DATE OF DISCHARGE:                                 CONSULTATION   REASON FOR CONSULTATION:  Hemoptysis and dyspnea.   HISTORY:  A 64 year old [place] female in this country since 1990 with  a history of smoking, which she stopped apparently about 20 years ago,  presents with 7 to 10 months history of increasing dyspnea with activity  associated with a hacking cough intermittently productive of minimal  amounts of bloody sputum.  This has already been evaluated on May 22  with a CT scan of the chest which was felt to be negative, as well as  the head scan showing no evidence of sinus disease.  She became abruptly  worse with more dyspnea and cough and hemoptysis (not quantified) and  was admitted on June 13, 2007, for evaluation with a chest x-ray that  was unchanged.  She did have a low grade fever and has been treated  empirically with Avelox with no fever since started.   The patient has had about 30-pound weight loss, reported low grade  fevers and sweats, and is presently in isolation for tuberculosis, but  note has no infiltrates on x-ray nor known exposure.  There has been no  apparent pleuritic pain.  She has had mild dysphagia.   PAST MEDICAL HISTORY:  1. Obesity with diabetes type 2.  2. Reflux disease.  3. Hypertension.  4. Neuropathy.   MEDICATIONS ON ADMISSION:  Three different forms of Naprosyn and Advil,  metformin, lorazepam, and butalbital/acetaminophen   ALLERGIES:  None known.   SOCIAL HISTORY:  She quits smoking 20 years ago.  She lives with  relatives.  There has been no alcohol history.   FAMILY HISTORY:  Negative for respiratory diseases, tuberculosis, or  atypia.   REVIEW OF  SYSTEMS:  Taken in as much detail as possible through her son  who is serving as interpreter, negative except as outlined above.   PHYSICAL EXAMINATION:  GENERAL:  This is an elderly black female who  appears to have a hopeless, helpless, affect, and attitude.  During the  interview of exam, she seemed perfectly well and did not cough single  time.  Her son coughed incessantly.  She has been continuously afebrile  since hospitalization.  Saturation is 93% on room air, there is one  reading of 84% room air on the chart.  VITAL SIGNS:  Normal  HEENT: Unremarkable.  Oropharynx is clear.  NECK:  Supple without cervical adenopathy or tenderness.  Trachea is  midline with no thyromegaly.  LUNGS:  Lung fields are perfectly clear bilaterally auscultation and  percussion with excellent air movement.  There is regular rhythm without  murmur, rub, or gallop.  ABDOMEN:  Soft and benign.  EXTREMITIES:  Warm without calf tenderness, cyanosis, clubbing, or  edema.  IMPRESSION:  Possible chronic bronchitis related to previous smoking  history or cold rhinitis.  I have noted the absence of sinusitis on  recent CT scan at rest, absence of infiltrates or bronchiectasis on the  recent CT scan of the chest.   The most striking aspect of the exam is the discrepancy between how good  this patient looks and how bad her son looked during the interview on  exam.  I suspect there are some functional issues here that the language  barrier may be hiding in terms of understanding exactly what the patient  is complaining of since the patient's son actually described many of his  own symptoms rather than her symptoms during the interview process.  She  never coughed once during the interview on exam, her lungs very  perfectly clear.   I did agree with checking AFBs to be complete.  I would also work her up  for occult alveolar hemorrhage with sed rate, ANA, and C-ANCA.  Although  note, her urinalysis does not  show significant red cells.  I had also  checked her BNP for occult heart failure in this setting.   For now, I would recommend treating her aggressively for reflux because  she does have mild dysphagia and also continue her on nebulizers for  now.  I would like to ambulate her in the hallway to see if she does  desaturate with activity after she has been cleared for tuberculosis and  can walk in the hall without a face mask.      Nichole Munoz. Melvyn Novas, MD, [facility]  Electronically Signed     MBW/MEDQ  D:  06/14/2007  T:  06/15/2007  Job:  [id number]   cc:   Nichole Munoz, M.D.

## 2010-07-20 ENCOUNTER — Emergency Department (HOSPITAL_COMMUNITY): Payer: Medicare Other

## 2010-07-20 ENCOUNTER — Inpatient Hospital Stay (HOSPITAL_COMMUNITY)
Admission: EM | Admit: 2010-07-20 | Discharge: 2010-07-24 | DRG: 392 | Disposition: A | Discharge status: Home / Self Care | Payer: Medicare Other | Attending: Family Medicine | Admitting: Family Medicine

## 2010-07-20 DIAGNOSIS — N183 Chronic kidney disease, stage 3 unspecified: Secondary | ICD-10-CM | POA: Diagnosis present

## 2010-07-20 DIAGNOSIS — R112 Nausea with vomiting, unspecified: Secondary | ICD-10-CM | POA: Diagnosis present

## 2010-07-20 DIAGNOSIS — E876 Hypokalemia: Secondary | ICD-10-CM | POA: Diagnosis present

## 2010-07-20 DIAGNOSIS — I129 Hypertensive chronic kidney disease with stage 1 through stage 4 chronic kidney disease, or unspecified chronic kidney disease: Secondary | ICD-10-CM | POA: Diagnosis present

## 2010-07-20 DIAGNOSIS — N179 Acute kidney failure, unspecified: Secondary | ICD-10-CM | POA: Diagnosis present

## 2010-07-20 DIAGNOSIS — A09 Infectious gastroenteritis and colitis, unspecified: Principal | ICD-10-CM | POA: Diagnosis present

## 2010-07-20 DIAGNOSIS — E119 Type 2 diabetes mellitus without complications: Secondary | ICD-10-CM | POA: Diagnosis present

## 2010-07-20 LAB — COMPREHENSIVE METABOLIC PANEL
BUN: 33 mg/dL — ABNORMAL HIGH (ref 6–23)
CO2: 26 mEq/L (ref 19–32)
Calcium: 9.1 mg/dL (ref 8.4–10.5)
Creatinine, Ser: 1.61 mg/dL — ABNORMAL HIGH (ref 0.50–1.10)
GFR calc Af Amer: 39 mL/min — ABNORMAL LOW (ref >60)
GFR calc non Af Amer: 32 mL/min — ABNORMAL LOW (ref >60)
Glucose, Bld: 228 mg/dL — ABNORMAL HIGH (ref 70–99)

## 2010-07-20 LAB — LACTIC ACID, PLASMA: Lactic Acid, Venous: 4.3 mmol/L — ABNORMAL HIGH (ref 0.5–2.2)

## 2010-07-20 LAB — DIFFERENTIAL
Basophils Absolute: 0 10*3/uL (ref 0.0–0.1)
Lymphs Abs: 1.1 10*3/uL (ref 0.7–4.0)
Monocytes Relative: 2 % — ABNORMAL LOW (ref 3–12)
Neutro Abs: 19.5 10*3/uL — ABNORMAL HIGH (ref 1.7–7.7)

## 2010-07-20 LAB — URINALYSIS, ROUTINE W REFLEX MICROSCOPIC
Bilirubin Urine: NEGATIVE
Glucose, UA: NEGATIVE mg/dL
Hgb urine dipstick: NEGATIVE
Ketones, ur: NEGATIVE mg/dL
Leukocytes, UA: NEGATIVE
Protein, ur: NEGATIVE mg/dL

## 2010-07-20 LAB — TROPONIN I: Troponin I: <0.3 ng/mL (ref <0.30)

## 2010-07-20 LAB — CBC
Hemoglobin: 11.9 g/dL — ABNORMAL LOW (ref 12.0–15.0)
MCH: 29.5 pg (ref 26.0–34.0)
MCHC: 34.3 g/dL (ref 30.0–36.0)

## 2010-07-20 LAB — LIPASE, BLOOD: Lipase: 27 U/L (ref 11–59)

## 2010-07-20 MED ORDER — IOHEXOL 300 MG/ML  SOLN
80.0000 mL | Freq: Once | INTRAMUSCULAR | Status: AC | PRN
  Administered 2010-07-20: 80 mL via INTRAVENOUS

## 2010-07-21 LAB — BASIC METABOLIC PANEL
CO2: 22 mEq/L (ref 19–32)
Calcium: 7 mg/dL — ABNORMAL LOW (ref 8.4–10.5)
Chloride: 107 mEq/L (ref 96–112)
Glucose, Bld: 191 mg/dL — ABNORMAL HIGH (ref 70–99)
Sodium: 137 mEq/L (ref 135–145)

## 2010-07-21 LAB — CBC
HCT: 27.9 % — ABNORMAL LOW (ref 36.0–46.0)
Hemoglobin: 9.5 g/dL — ABNORMAL LOW (ref 12.0–15.0)
Hemoglobin: 10.4 g/dL — ABNORMAL LOW (ref 12.0–15.0)
MCH: 29.6 pg (ref 26.0–34.0)
MCHC: 34.1 g/dL (ref 30.0–36.0)
MCHC: 34.2 g/dL (ref 30.0–36.0)
MCV: 86.6 fL (ref 78.0–100.0)
Platelets: 149 10*3/uL — ABNORMAL LOW (ref 150–400)
RDW: 12.1 % (ref 11.5–15.5)
RDW: 12.2 % (ref 11.5–15.5)

## 2010-07-21 LAB — TYPE AND SCREEN
ABO/RH(D): B POS
Antibody Screen: NEGATIVE

## 2010-07-21 LAB — PROTIME-INR
INR: 1.11 (ref 0.00–1.49)
Prothrombin Time: 14.5 seconds (ref 11.6–15.2)

## 2010-07-21 LAB — URINE CULTURE
Colony Count: NO GROWTH
Culture  Setup Time: 201207141951
Culture: NO GROWTH

## 2010-07-21 LAB — APTT: aPTT: 32 seconds (ref 24–37)

## 2010-07-21 LAB — CLOSTRIDIUM DIFFICILE BY PCR: Toxigenic C. Difficile by PCR: NEGATIVE

## 2010-07-22 LAB — DIFFERENTIAL
Eosinophils Absolute: 0.3 10*3/uL (ref 0.0–0.7)
Eosinophils Relative: 4 % (ref 0–5)
Lymphocytes Relative: 18 % (ref 12–46)
Lymphs Abs: 1.4 10*3/uL (ref 0.7–4.0)
Monocytes Relative: 7 % (ref 3–12)

## 2010-07-22 LAB — BASIC METABOLIC PANEL
BUN: 16 mg/dL (ref 6–23)
CO2: 23 mEq/L (ref 19–32)
Calcium: 7.5 mg/dL — ABNORMAL LOW (ref 8.4–10.5)
Chloride: 110 mEq/L (ref 96–112)
Creatinine, Ser: 1.69 mg/dL — ABNORMAL HIGH (ref 0.50–1.10)
GFR calc Af Amer: 37 mL/min — ABNORMAL LOW (ref >60)

## 2010-07-22 LAB — CBC
HCT: 29.3 % — ABNORMAL LOW (ref 36.0–46.0)
HCT: 29.5 % — ABNORMAL LOW (ref 36.0–46.0)
Hemoglobin: 10.1 g/dL — ABNORMAL LOW (ref 12.0–15.0)
MCH: 29.6 pg (ref 26.0–34.0)
MCH: 29.4 pg (ref 26.0–34.0)
MCV: 85.9 fL (ref 78.0–100.0)
MCV: 85.8 fL (ref 78.0–100.0)
Platelets: 154 10*3/uL (ref 150–400)
Platelets: 140 10*3/uL — ABNORMAL LOW (ref 150–400)
RBC: 3.41 MIL/uL — ABNORMAL LOW (ref 3.87–5.11)
RDW: 12.1 % (ref 11.5–15.5)
WBC: 8.5 10*3/uL (ref 4.0–10.5)
WBC: 7.7 10*3/uL (ref 4.0–10.5)

## 2010-07-22 LAB — OVA AND PARASITE EXAMINATION: Ova and parasites: NONE SEEN

## 2010-07-22 LAB — GLUCOSE, CAPILLARY: Glucose-Capillary: 111 mg/dL — ABNORMAL HIGH (ref 70–99)

## 2010-07-23 LAB — BASIC METABOLIC PANEL
BUN: 9 mg/dL (ref 6–23)
Calcium: 7.7 mg/dL — ABNORMAL LOW (ref 8.4–10.5)
Creatinine, Ser: 1.37 mg/dL — ABNORMAL HIGH (ref 0.50–1.10)
GFR calc non Af Amer: 39 mL/min — ABNORMAL LOW (ref >60)
Glucose, Bld: 119 mg/dL — ABNORMAL HIGH (ref 70–99)

## 2010-07-23 LAB — CBC
HCT: 29.5 % — ABNORMAL LOW (ref 36.0–46.0)
Hemoglobin: 10.2 g/dL — ABNORMAL LOW (ref 12.0–15.0)
MCH: 29.2 pg (ref 26.0–34.0)
MCHC: 34.6 g/dL (ref 30.0–36.0)
MCV: 84.5 fL (ref 78.0–100.0)
RDW: 12.2 % (ref 11.5–15.5)

## 2010-07-23 LAB — GLUCOSE, CAPILLARY: Glucose-Capillary: 117 mg/dL — ABNORMAL HIGH (ref 70–99)

## 2010-07-24 LAB — GLUCOSE, CAPILLARY
Glucose-Capillary: 156 mg/dL — ABNORMAL HIGH (ref 70–99)
Glucose-Capillary: 161 mg/dL — ABNORMAL HIGH (ref 70–99)

## 2010-07-26 LAB — CULTURE, BLOOD (ROUTINE X 2)
Culture  Setup Time: 201207141855
Culture: NO GROWTH
Culture: NO GROWTH

## 2010-07-31 LAB — STOOL CULTURE

## 2010-08-01 NOTE — Discharge Summary (Signed)
NAME:  Nichole Munoz, QUI NO.:  [id number]  MEDICAL RECORD NO.:  [id number]  LOCATION:  [id number]                         FACILITY:  [facility]  PHYSICIAN:  Kathie Dike, MD     DATE OF BIRTH:  10-Jan-1945  DATE OF ADMISSION:  07/20/2010 DATE OF DISCHARGE:                              DISCHARGE SUMMARY   PRIMARY CARE PHYSICIAN:  Dr. Redmond Pulling at [facility].  DISCHARGE DIAGNOSES: 1. Infectious colitis, improved. 2. Acute renal failure, on chronic kidney disease stage III, improved. 3. Dehydration, resolved. 4. Hypomagnesemia, hypokalemia, repleted. 5. Type 2 diabetes, stable, lactic acidosis secondary to metformin,     resolved.  DISCHARGE MEDICATIONS: 1. Gabapentin 300 mg p.o. t.i.d. 2. Glipizide XL 5 mg 2 tablets p.o. q.a.m. 3. Cetirizine 5 mg 2 tablets p.o. daily p.r.n. 4. QVAR 80 mcg 2 puffs inhaled b.i.d. 5. Amitriptyline 25 mg p.o. q.h.s. p.r.n. 6. Proventil inhaler 2 puffs inhaled q.4h. p.r.n. 7. Amlodipine 5 mg p.o. daily.  MEDICATIONS STOPPED [place]: 1. Metformin due to lactic acidosis. 2. Lisinopril secondary to renal failure.  ADMISSION HISTORY:  Nichole is a 65 year old Asian female who presents to the emergency room with nausea, vomiting and diarrhea.  The patient had reported drinking some milk after which she had the onset of her symptoms.  Her son had similar symptoms but had drank less milk and therefore, his symptoms were less severe.  She was brought to the emergency room with high fevers and diffuse abdominal pain.  She is found to have a significant leukocytosis of 21,000 and a CT scan of the abdomen and pelvis revealed right-sided colitis.  The patient was subsequently admitted for further evaluation.  For details, please refer to the history and physical per Dr. Ola Spurr on July 14.  HOSPITAL COURSE: 1. Colitis.  The patient was seen in consultation by Dr. Michail Sermon from     [facility].  It was felt that Nichole is an infectious colitis.   Her     stool cultures, stool for C. diff and ova and parasites have been     negative to date.  She was initially empirically placed on Zosyn.     Nichole was subsequently discontinued.  The patient has not had any     fevers.  Her diarrhea has since improved.  She is having minimal     stool output at Nichole time.  Abdominal pain has resolved and she is     requesting to be discharged home.  The patient will need a     screening colonoscopy as an outpatient and she is going to follow     up with Dr. Michail Sermon for Nichole. 2. Acute renal failure.  The patient was adequately hydrated with IV     fluids.  Her lisinopril was held.  We will not restart lisinopril     at Nichole time.  We will defer her to primary care physician for     Nichole. 3. Lactic acidosis.  The patient did have an elevated lactate on     admission of 4.3.  Her metformin was subsequently held and we will     recommend not to  restart Nichole and we have restarted her glipizide. 4. Hypertension since the patient is off lisinopril.  We will add some     amlodipine to her antihypertensive regimen.  CONSULTATIONS:  [facility], Dr. Michail Sermon.  DIAGNOSTIC IMAGING:  CT abdomen and pelvis on July 14 shows colitis of right colon, possibly featureless terminal ileum.  Findings related to Crohn disease.  Differential diagnosis does include ulcerative colitis and infectious colitis.  There is mild pericolic stranding with no evidence of abscess or free fluid/air.  DISCHARGE INSTRUCTIONS:  The patient should continue on a heart-healthy, low-calorie diet, conduct activity as tolerated.  She already has a followup appointment with primary care physician at [facility] on July 30.  She will be given number for Dr. Kathline Magic office to schedule an outpatient colonoscopy for screening purposes.  She is otherwise stable for discharge.  CONDITION AT TIME OF DISCHARGE:  Improved.  Plan was discussed with the patient as well as son who are in  agreement.     Kathie Dike, MD     JM/MEDQ  D:  07/24/2010  T:  07/24/2010  Job:  [id number]  cc:   Dr. Redmond Pulling at [facility]  Electronically Signed by [facility] Nathaneal Sommers  on 08/01/2010 12:53:15 AM

## 2010-08-08 NOTE — Consult Note (Signed)
NAME:  Nichole Munoz, Nichole Munoz NO.:  [id number]  MEDICAL RECORD NO.:  [id number]  LOCATION:  [id number]                         FACILITY:  [facility]  PHYSICIAN:  Lear Ng, MDDATE OF BIRTH:  [birth year]  DATE OF CONSULTATION: DATE OF DISCHARGE:                                CONSULTATION   REFERRING PHYSICIAN:  Leonel Ramsay, MD  REASON FOR CONSULTATION:  Nausea, vomiting and diarrhea.  HISTORY OF PRESENT ILLNESS:  Nichole Munoz is a pleasant 65 year old [place] female who speaks no English and her son translates for her. She was admitted on July 20, 2010, due to nausea, vomiting and profuse diarrhea.  Her son states that she drank some [place]ian milk 3 cups worth on July 17, 2010, and shortly after that prior to eating she started having profuse nausea and vomiting.  He drank one cup and had nausea, vomiting, diarrhea but got better.  She developed watery diarrhea for several days that has been unrelenting and she started having bloody diarrhea in the hospital.  She has had abdominal pain described as diffuse in nature and sharp.  It feels like a needle sticking her when her abdomen is palpated.  She has never had a colonoscopy.  Her nausea and vomiting has resolved in the hospital.  A CT scan on presentation showed colonic wall thickening in the right colon with pericolonic inflammatory changes.  There was a question of distal small bowel dilation and lack of features on CT scan.  The left colon showed extensive diverticulosis without inflammation or diverticulitis.  No evidence of abscess or free air was seen.  She has been medically managed, placed on IV Zosyn and clear liquid diet but otherwise bowel rest.  So far her C diff PCR is negative.  Stool culture and ova and parasite is pending.  PAST MEDICAL HISTORY:  Hypertension, diabetes, asthma, gastroesophageal reflux disease, history of kidney stones, history of neuropathy.  FAMILY HISTORY:   Noncontributory.  SOCIAL HISTORY:  Recently from [place].  Denies tobacco, alcohol or drugs.  CURRENT MEDICATIONS:  Zosyn, insulin, Flovent, Biotene, dose listed in hospital record.  Additional medicines listed are p.r.n.  ALLERGIES:  No known drug allergies.  PHYSICAL EXAMINATION:  VITAL SIGNS:  Temperature 98.7, pulse 99, blood pressure 125/71, repeat pulse check of 82, O2 sat 100% on room air. GENERAL:  Alert, well nourished, no acute distress. ABDOMEN:  Diffuse tenderness with minimal guarding.  Soft, nondistended, positive bowel sounds.  LABORATORY DATA:  White blood count 7.7, hemoglobin 10.1, platelet count 140, INR 1.11, BUN 16, creatinine 1.69, potassium 3.2, lipase 27. Additional labs list in hospital record and were reviewed including negative C diff PCR, stool culture and ova and parasites are pending.  IMPRESSION:  The patient is a 65 year old Asian female presenting with profuse nausea, vomiting and diarrhea that was initially watery and became bloody shortly after ingesting three cups of Asian milk.  Her son ingested a smaller quantity and also became sick but reports that he is no longer sick.  She has infectious diarrhea until proven otherwise and would recommend supportive care and await stool studies.  Continue antibiotics, IV fluids and clear liquid diet.  We will hold off on colonoscopy unless her symptoms persist and stool studies are unrevealing.  Once her symptoms clear, she will need to have a colonoscopy as an outpatient for screening purposes but may need to do that as an inpatient for the reasons stated above.     Lear Ng, MD     VCS/MEDQ  D:  07/22/2010  T:  07/22/2010  Job:  [id number]  Electronically Signed by Wilford Corner MD on 08/08/2010 01:01:44 PM

## 2010-10-02 LAB — CBC
Hemoglobin: 13.1
RBC: 4.35
RDW: 12.2
WBC: 11.8 — ABNORMAL HIGH

## 2010-10-02 LAB — POCT CARDIAC MARKERS
CKMB, poc: 1.2
CKMB, poc: 1.8
Myoglobin, poc: 261
Operator id: 291361
Troponin i, poc: <0.05

## 2010-10-02 LAB — COMPREHENSIVE METABOLIC PANEL
ALT: 18
Alkaline Phosphatase: 83
Chloride: 102
Glucose, Bld: 148 — ABNORMAL HIGH
Potassium: 3.2 — ABNORMAL LOW
Sodium: 141
Total Protein: 8.1

## 2010-10-02 LAB — URINALYSIS, ROUTINE W REFLEX MICROSCOPIC
Bilirubin Urine: NEGATIVE
Hgb urine dipstick: NEGATIVE
Ketones, ur: NEGATIVE
Specific Gravity, Urine: 1.035 — ABNORMAL HIGH
pH: 6

## 2010-10-02 LAB — DIFFERENTIAL
Basophils Relative: 1
Eosinophils Absolute: 1.9 — ABNORMAL HIGH
Monocytes Absolute: 0.7
Monocytes Relative: 6
Neutrophils Relative %: 47

## 2010-10-03 LAB — AFB CULTURE WITH SMEAR (NOT AT ARMC)
Acid Fast Smear: NONE SEEN
Acid Fast Smear: NONE SEEN

## 2010-10-03 LAB — COMPREHENSIVE METABOLIC PANEL WITH GFR
Albumin: 3.6
BUN: 22
Calcium: 8.9
Creatinine, Ser: 1.02
Total Protein: 7.8

## 2010-10-03 LAB — DIFFERENTIAL
Basophils Absolute: 0
Basophils Absolute: 0.1
Basophils Relative: 0
Basophils Relative: 1
Eosinophils Absolute: 1 — ABNORMAL HIGH
Eosinophils Absolute: 1.6 — ABNORMAL HIGH
Eosinophils Relative: 11 — ABNORMAL HIGH
Eosinophils Relative: 12 — ABNORMAL HIGH
Lymphocytes Relative: 21
Lymphocytes Relative: 27
Lymphs Abs: 2.7
Monocytes Absolute: 0.6
Monocytes Relative: 5
Neutro Abs: 8 — ABNORMAL HIGH
Neutrophils Relative %: 62

## 2010-10-03 LAB — CULTURE, BLOOD (ROUTINE X 2)
Culture: NO GROWTH
Culture: NO GROWTH

## 2010-10-03 LAB — CULTURE, RESPIRATORY W GRAM STAIN: Culture: NORMAL

## 2010-10-03 LAB — COMPREHENSIVE METABOLIC PANEL
ALT: 22
AST: 20
Alkaline Phosphatase: 83
CO2: 26
Chloride: 100
GFR calc Af Amer: >60
GFR calc non Af Amer: 55 — ABNORMAL LOW
Glucose, Bld: 374 — ABNORMAL HIGH
Potassium: 3.3 — ABNORMAL LOW
Sodium: 136
Total Bilirubin: 0.5

## 2010-10-03 LAB — URINALYSIS, ROUTINE W REFLEX MICROSCOPIC
Bilirubin Urine: NEGATIVE
Glucose, UA: >1000 — AB
Hgb urine dipstick: NEGATIVE
Ketones, ur: NEGATIVE
Leukocytes, UA: NEGATIVE
Nitrite: NEGATIVE
Protein, ur: NEGATIVE
Specific Gravity, Urine: 1.022
Urobilinogen, UA: 1
pH: 7.5

## 2010-10-03 LAB — URINE MICROSCOPIC-ADD ON

## 2010-10-03 LAB — TROPONIN I: Troponin I: 0.04

## 2010-10-03 LAB — EXPECTORATED SPUTUM ASSESSMENT W GRAM STAIN, RFLX TO RESP C

## 2010-10-03 LAB — CBC
HCT: 35.6 — ABNORMAL LOW
HCT: 37.2
Hemoglobin: 12.6
MCHC: 35.3
MCV: 87.6
Platelets: 244
Platelets: 253
RBC: 4.07
RDW: 11.5
RDW: 11.8
WBC: 13 — ABNORMAL HIGH

## 2010-10-03 LAB — ANTI-NUCLEAR AB-TITER (ANA TITER): ANA Titer 1: 1:40 {titer} — ABNORMAL HIGH

## 2010-10-03 LAB — CEA: CEA: 1.1

## 2010-10-03 LAB — HEMOGLOBIN A1C: Mean Plasma Glucose: 215

## 2010-10-03 LAB — BASIC METABOLIC PANEL
BUN: 20
GFR calc non Af Amer: 50 — ABNORMAL LOW
Glucose, Bld: 213 — ABNORMAL HIGH
Potassium: 4

## 2010-10-03 LAB — LACTATE DEHYDROGENASE: LDH: 152

## 2010-10-03 LAB — B-NATRIURETIC PEPTIDE (CONVERTED LAB): Pro B Natriuretic peptide (BNP): <30

## 2010-10-03 LAB — ANTI-NEUTROPHIL ANTIBODY

## 2010-10-17 NOTE — H&P (Signed)
NAME:  Nichole Munoz, Nichole Munoz NO.:  [id number]  MEDICAL RECORD NO.:  [id number]  LOCATION:  WLED                         FACILITY:  [facility]  PHYSICIAN:  Leonel Ramsay, MD DATE OF BIRTH:  03-24-1945  DATE OF ADMISSION:  07/20/2010 DATE OF DISCHARGE:                             HISTORY & PHYSICAL   PRIMARY CARE DOCTOR:  [facility].  CHIEF COMPLAINTS:  Nausea, vomiting, diarrhea.  HISTORY OF PRESENT ILLNESS:  This is a 65 year old woman from [place] originally with a history of hypertension, diabetes, reactive airways disease and GERD, who reports nausea, vomiting and diarrhea for 2-3 days.  The son is her Optometrist.  She speaks little [place].  She says they both drank some milk that they had bought on the July 17, 2010, and have had both of them nausea, vomiting and diarrhea.  His symptoms are resolving whereas his mother's are worsening.  She has had high fevers that he felt are burning up.  She has had diffuse abdominal pain.  The diarrhea is nonbloody, very watery type.  She has not been losing weight and has had no prior symptoms related to this.  She has no history of any gastrointestinal issues except for GERD.  PAST MEDICAL HISTORY: 1. Hypertension. 2. Diabetes. 3. Asthma and reactive airway disease. 4. GERD. 5. Prior history of a kidney stone. 6. Neuropathy.  FAMILY HISTORY:  Noncontributory.  PAST SURGICAL HISTORY:  Noncontributory.  SOCIAL HISTORY:  She is a nonsmoker, nondrinker.  Denies drugs.  She is originally from [place], but has been here 22 years.  ALLERGIES:  No known drug allergies.  MEDICATIONS: 1. Amitriptyline 25 q.h.s. 2. Proventil inhaler 2 puffs q.4h. 3. QVAR 80 mcg 2 puffs b.i.d. 4. Metformin 1000 b.i.d. 5. Cetirizine 5 mg 2 tablets daily as needed. 6. Lisinopril 10 mg once a day. 7. Glipizide XL 5 mg 2 tablets a day. 8. Gabapentin 300 mg 1 capsule three times a day.  REVIEW OF SYSTEMS:  Eleven systems reviewed and  negative except as per HPI.  PHYSICAL EXAMINATION:  VITAL SIGNS:  Temperature on arrival is 98.7, but during my exam, she felt quite warm and her temperature was 102.8; pulse of 77; blood pressure varying between 91/67 to 122/63; heart rate 60-77; respiratory rate 20; saturating 98%. GENERAL:  She is an overweight Asian female, who is lying in bed, in no acute distress.  She is having urinary incontinence and  using Depend. HEENT:  Her pupils are equal, round and reactive to light and accommodation.  Extraocular movements are intact.  Her sclerae are anicteric.  She has no pallor.  Her oropharynx, mucous membranes are dry. NECK:  Supple. HEART:  Regular. LUNGS:  Clear. ABDOMEN:  Soft, diffusely nontender.  Positive bowel sounds, some mild distention.  No focal rebound or guarding.  No hepatosplenomegaly. EXTREMITIES:  No clubbing, cyanosis, or edema.  LABORATORY DATA:  Relevant lactate 4.3.  White count 21.0 with neutrophil count of 19.5, hemoglobin 11.9, platelets 168,000.  Lipase normal at 27.  Basic panel:  Sodium 133, potassium 3.6, chloride 93, bicarb 26, BUN 33, creatinine 1.61, glucose 228.  AST, ALT and alkaline phosphatase are within normal  limits.  Urinalysis is normal.  Troponin less than 0.3.  DIAGNOSTIC STUDIES:  CT scan done with contrast reveals colitis of the right colon with some pericolonic inflammatory stranding within the fat. This seems to involve the right colon and extends into the transverse colon.  There is also diverticulosis, but no diverticulitis in the left colon.  There is no abscess or free air fluid levels.  IMPRESSION: 1. Colitis, most likely bacterial. 2. Fever, leukocytosis. 3. Diabetes. 4. Hypertension. 5. Volume depletion and acute renal failure.  PLAN: 1. We will admit the patient for IV fluid rehydration.  Started her on     Cipro and Flagyl for probable bacterial colitis. 2. We will do blood cultures, stool cultures, stool O and P and  C     difficile testing as the patient was given antibiotics 1 month ago     for pneumonia. 3. We will isolate the patient enterically. 4. For the diabetes, has held her metformin and glipizide given her     nausea, vomiting as well as her lactic     acidosis.  We will place her on sliding scale insulin. 5. Hypertension:  Her blood pressures have been running low, so we     will hold her lisinopril for now. 6. Dispo:  Patient lives at home with her son and can return there     when stable.     Leonel Ramsay, MD     DPF/MEDQ  D:  07/20/2010  T:  07/20/2010  Job:  [id number]  Electronically Signed by Gevena Barre M.D. on 10/17/2010 10:36:07 AM

## 2011-03-03 ENCOUNTER — Emergency Department (HOSPITAL_COMMUNITY): Payer: Medicare Other

## 2011-03-03 ENCOUNTER — Emergency Department (HOSPITAL_COMMUNITY)
Admission: EM | Admit: 2011-03-03 | Discharge: 2011-03-03 | Disposition: A | Discharge status: Home / Self Care | Payer: Medicare Other | Attending: Emergency Medicine | Admitting: Emergency Medicine

## 2011-03-03 ENCOUNTER — Encounter (HOSPITAL_COMMUNITY): Payer: Self-pay | Admitting: *Deleted

## 2011-03-03 DIAGNOSIS — R079 Chest pain, unspecified: Secondary | ICD-10-CM | POA: Insufficient documentation

## 2011-03-03 DIAGNOSIS — E119 Type 2 diabetes mellitus without complications: Secondary | ICD-10-CM | POA: Insufficient documentation

## 2011-03-03 DIAGNOSIS — R42 Dizziness and giddiness: Secondary | ICD-10-CM | POA: Insufficient documentation

## 2011-03-03 DIAGNOSIS — R059 Cough, unspecified: Secondary | ICD-10-CM | POA: Insufficient documentation

## 2011-03-03 DIAGNOSIS — R05 Cough: Secondary | ICD-10-CM | POA: Insufficient documentation

## 2011-03-03 DIAGNOSIS — I10 Essential (primary) hypertension: Secondary | ICD-10-CM | POA: Insufficient documentation

## 2011-03-03 DIAGNOSIS — J189 Pneumonia, unspecified organism: Secondary | ICD-10-CM

## 2011-03-03 DIAGNOSIS — Z79899 Other long term (current) drug therapy: Secondary | ICD-10-CM | POA: Insufficient documentation

## 2011-03-03 DIAGNOSIS — R509 Fever, unspecified: Secondary | ICD-10-CM | POA: Insufficient documentation

## 2011-03-03 DIAGNOSIS — R5381 Other malaise: Secondary | ICD-10-CM | POA: Insufficient documentation

## 2011-03-03 DIAGNOSIS — R062 Wheezing: Secondary | ICD-10-CM | POA: Insufficient documentation

## 2011-03-03 HISTORY — DX: Essential (primary) hypertension: I10

## 2011-03-03 MED ORDER — MOXIFLOXACIN HCL 400 MG PO TABS: 400.0000 mg | ORAL_TABLET | Freq: Every day | ORAL | Status: AC

## 2011-03-03 MED ORDER — MOXIFLOXACIN HCL 400 MG PO TABS
400.0000 mg | ORAL_TABLET | Freq: Once | ORAL | Status: AC
  Administered 2011-03-03: 400 mg via ORAL
  Filled 2011-03-03: qty 1

## 2011-03-03 NOTE — ED Notes (Signed)
Patient with onset of cough, fever, weakness, and tingling in her chest.  She also reports dizziness.

## 2011-03-03 NOTE — ED Notes (Signed)
Pt brought back from the waiting.  Alert steady gait

## 2011-03-03 NOTE — ED Provider Notes (Signed)
History     CSN: [id number]  Arrival date & time 03/03/11  1413   First MD Initiated Contact with Patient 03/03/11 1733      Chief Complaint  Patient presents with  . Weakness  . Fever  . Cough  . Dizziness    (Consider location/radiation/quality/duration/timing/severity/associated sxs/prior treatment) Patient is a 66 y.o. female presenting with weakness, fever, and cough. The history is provided by the patient.  Weakness The primary symptoms include fever. Primary symptoms do not include loss of consciousness, nausea or vomiting. The symptoms began 2 days ago. The symptoms are unchanged. The neurological symptoms are diffuse.  Additional symptoms include weakness.  Fever Primary symptoms of the febrile illness include fever, cough and wheezing. Primary symptoms do not include shortness of breath, nausea or vomiting.  The patient's medical history is significant for asthma.  Cough This is a new problem. Episode onset: 1 week ago. The problem occurs constantly. The problem has been gradually worsening. The cough is productive of sputum. Maximum temperature: subjective. Associated symptoms include chills and wheezing. Pertinent negatives include no chest pain, no ear congestion, no rhinorrhea and no shortness of breath. She has tried decongestants for the symptoms. The treatment provided no relief. She is not a smoker. Her past medical history is significant for asthma.    Past Medical History  Diagnosis Date  . Diabetes mellitus   . Hypertension     History reviewed. No pertinent past surgical history.  No family history on file.  History  Substance Use Topics  . Smoking status: Former Research scientist (life sciences)  . Smokeless tobacco: Not on file  . Alcohol Use: No    OB History    Grav Para Term Preterm Abortions TAB SAB Ect Mult Living                  Review of Systems  Constitutional: Positive for fever and chills.  HENT: Negative for rhinorrhea.   Respiratory: Positive for cough  and wheezing. Negative for shortness of breath.   Cardiovascular: Negative for chest pain.  Gastrointestinal: Negative for nausea and vomiting.  Neurological: Positive for weakness. Negative for loss of consciousness.  All other systems reviewed and are negative.    Allergies  Review of patient's allergies indicates no known allergies.  Home Medications   Current Outpatient Rx  Name Route Sig Dispense Refill  . ALBUTEROL SULFATE HFA 108 (90 BASE) MCG/ACT IN AERS Inhalation Inhale 2 puffs into the lungs every 4 (four) hours as needed. As needed for shortness of breath.    . BECLOMETHASONE DIPROPIONATE 80 MCG/ACT IN AERS Inhalation Inhale 2-4 puffs into the lungs every 4 (four) hours as needed. As needed for asthma.    . CETIRIZINE HCL 10 MG PO TABS Oral Take 5 mg by mouth daily.    Marland Kitchen GLIPIZIDE ER 10 MG PO TB24 Oral Take 20 mg by mouth every morning.    Marland Kitchen LISINOPRIL 10 MG PO TABS Oral Take 10 mg by mouth at bedtime.    Marland Kitchen METFORMIN HCL 1000 MG PO TABS Oral Take 1,000 mg by mouth 2 (two) times daily with a meal.    . MUCINEX FAST-MAX COLD & SINUS PO Oral Take 2 tablets by mouth every 6 (six) hours as needed. As needed for congestion.      BP 138/69  Pulse 101  Temp(Src) 98.1 F (36.7 C) (Oral)  Resp 16  SpO2 98%  Physical Exam  Nursing note and vitals reviewed. Constitutional: She is oriented to person,  place, and time. She appears well-developed and well-nourished. No distress.  HENT:  Head: Normocephalic and atraumatic.  Mouth/Throat: Oropharynx is clear and moist.  Eyes: EOM are normal. Pupils are equal, round, and reactive to light.  Cardiovascular: Normal rate, regular rhythm, normal heart sounds and intact distal pulses.  Exam reveals no friction rub.   No murmur heard. Pulmonary/Chest: Effort normal. She has wheezes. She has no rales. She exhibits no tenderness.       This was over the chest consistent with coining  Abdominal: Soft. Bowel sounds are normal. She exhibits  no distension. There is no tenderness. There is no rebound and no guarding.  Musculoskeletal: Normal range of motion. She exhibits no tenderness.       No edema  Neurological: She is alert and oriented to person, place, and time. No cranial nerve deficit.  Skin: Skin is warm and dry. No rash noted.  Psychiatric: She has a normal mood and affect. Her behavior is normal.    ED Course  Procedures (including critical care time)  Labs Reviewed - No data to display Dg Chest 2 View  03/03/2011  *RADIOLOGY REPORT*  Clinical Data: Chest pain  CHEST - 2 VIEW  Comparison: 07/20/2010  Findings: Heart size is normal.  Mediastinal shadows are normal. Vascularity is normal.  No effusions.  I question if there is mild patchy density in the right upper lobe at the left base.  This is not definite.  No significant bony finding.  IMPRESSION: No active disease no definite active disease.  Question early / mild patchy infiltrates in the right upper lobe and left lower lobe.  Original Report Authenticated By: Jules Schick, M.D.     No diagnosis found.    MDM   Patient with productive cough and generalized aches. She is wheezing on exam but has a history of asthma and is using inhaler at home. Chest x-ray shows signs of new pneumonia. Will treat patient with Avelox. Satting normally 90% on room air. She is tolerating fluids without any vomiting. She is well hydrated on exam. Will discharge home with antibiotics to follow up with her regular doctor.        Blanchie Dessert, MD 03/03/11 [phone]

## 2011-03-03 NOTE — ED Notes (Signed)
Illness for one week  With  Coughing and some bi-lateral shoulder pain.  No distress

## 2011-03-17 ENCOUNTER — Emergency Department (HOSPITAL_COMMUNITY)
Admission: EM | Admit: 2011-03-17 | Discharge: 2011-03-18 | Disposition: A | Discharge status: Home / Self Care | Payer: Medicare Other | Attending: Emergency Medicine | Admitting: Emergency Medicine

## 2011-03-17 ENCOUNTER — Emergency Department (HOSPITAL_COMMUNITY): Payer: Medicare Other

## 2011-03-17 DIAGNOSIS — I498 Other specified cardiac arrhythmias: Secondary | ICD-10-CM | POA: Insufficient documentation

## 2011-03-17 DIAGNOSIS — R05 Cough: Secondary | ICD-10-CM | POA: Insufficient documentation

## 2011-03-17 DIAGNOSIS — R5383 Other fatigue: Secondary | ICD-10-CM | POA: Insufficient documentation

## 2011-03-17 DIAGNOSIS — R071 Chest pain on breathing: Secondary | ICD-10-CM | POA: Insufficient documentation

## 2011-03-17 DIAGNOSIS — J45909 Unspecified asthma, uncomplicated: Secondary | ICD-10-CM | POA: Insufficient documentation

## 2011-03-17 DIAGNOSIS — D72829 Elevated white blood cell count, unspecified: Secondary | ICD-10-CM | POA: Insufficient documentation

## 2011-03-17 DIAGNOSIS — R5381 Other malaise: Secondary | ICD-10-CM | POA: Insufficient documentation

## 2011-03-17 DIAGNOSIS — R059 Cough, unspecified: Secondary | ICD-10-CM | POA: Insufficient documentation

## 2011-03-17 DIAGNOSIS — R0602 Shortness of breath: Secondary | ICD-10-CM | POA: Insufficient documentation

## 2011-03-17 LAB — BASIC METABOLIC PANEL
CO2: 24 mEq/L (ref 19–32)
Chloride: 104 mEq/L (ref 96–112)
GFR calc Af Amer: 56 mL/min — ABNORMAL LOW (ref >90)
Sodium: 140 mEq/L (ref 135–145)

## 2011-03-17 LAB — POCT I-STAT TROPONIN I: Troponin i, poc: 0 ng/mL (ref 0.00–0.08)

## 2011-03-17 LAB — CBC
Platelets: 228 10*3/uL (ref 150–400)
RDW: 13 % (ref 11.5–15.5)
WBC: 14.4 10*3/uL — ABNORMAL HIGH (ref 4.0–10.5)

## 2011-03-17 LAB — URINALYSIS, ROUTINE W REFLEX MICROSCOPIC
Glucose, UA: NEGATIVE mg/dL
Nitrite: NEGATIVE
Protein, ur: NEGATIVE mg/dL
pH: 5.5 (ref 5.0–8.0)

## 2011-03-17 LAB — DIFFERENTIAL
Basophils Absolute: 0.1 10*3/uL (ref 0.0–0.1)
Lymphocytes Relative: 15 % (ref 12–46)
Neutro Abs: 10.7 10*3/uL — ABNORMAL HIGH (ref 1.7–7.7)
Neutrophils Relative %: 74 % (ref 43–77)

## 2011-03-17 LAB — URINE MICROSCOPIC-ADD ON

## 2011-03-17 MED ORDER — IPRATROPIUM BROMIDE 0.02 % IN SOLN
0.5000 mg | Freq: Once | RESPIRATORY_TRACT | Status: AC
  Administered 2011-03-17: 0.5 mg via RESPIRATORY_TRACT

## 2011-03-17 MED ORDER — HYDROCOD POLST-CHLORPHEN POLST 10-8 MG/5ML PO LQCR
5.0000 mL | Freq: Once | ORAL | Status: AC
  Administered 2011-03-17: 5 mL via ORAL
  Filled 2011-03-17: qty 5

## 2011-03-17 MED ORDER — IPRATROPIUM BROMIDE 0.02 % IN SOLN
RESPIRATORY_TRACT | Status: AC
  Administered 2011-03-17: 14:00:00
  Filled 2011-03-17: qty 2.5

## 2011-03-17 MED ORDER — ALBUTEROL SULFATE (5 MG/ML) 0.5% IN NEBU
2.5000 mg | INHALATION_SOLUTION | Freq: Once | RESPIRATORY_TRACT | Status: AC
  Administered 2011-03-17: 2.5 mg via RESPIRATORY_TRACT

## 2011-03-17 MED ORDER — IPRATROPIUM BROMIDE 0.02 % IN SOLN
RESPIRATORY_TRACT | Status: AC
  Administered 2011-03-17: 0.5 mg via RESPIRATORY_TRACT
  Filled 2011-03-17: qty 2.5

## 2011-03-17 MED ORDER — ALBUTEROL SULFATE HFA 108 (90 BASE) MCG/ACT IN AERS: 1.0000 | INHALATION_SPRAY | Freq: Four times a day (QID) | RESPIRATORY_TRACT | Status: DC | PRN

## 2011-03-17 MED ORDER — HYDROCODONE-ACETAMINOPHEN 7.5-500 MG/15ML PO SOLN: 15.0000 mL | Freq: Four times a day (QID) | ORAL | Status: AC | PRN

## 2011-03-17 MED ORDER — SODIUM CHLORIDE 0.9 % IV BOLUS (SEPSIS)
1000.0000 mL | Freq: Once | INTRAVENOUS | Status: AC
Start: 2011-03-17 — End: 2011-03-17
  Administered 2011-03-17: 1000 mL via INTRAVENOUS

## 2011-03-17 MED ORDER — ALBUTEROL SULFATE (5 MG/ML) 0.5% IN NEBU
5.0000 mg | INHALATION_SOLUTION | Freq: Once | RESPIRATORY_TRACT | Status: AC
  Administered 2011-03-17: 5 mg via RESPIRATORY_TRACT

## 2011-03-17 MED ORDER — PREDNISONE 20 MG PO TABS
60.0000 mg | ORAL_TABLET | Freq: Once | ORAL | Status: AC
  Administered 2011-03-17: 60 mg via ORAL
  Filled 2011-03-17: qty 3

## 2011-03-17 MED ORDER — ALBUTEROL (5 MG/ML) CONTINUOUS INHALATION SOLN
INHALATION_SOLUTION | RESPIRATORY_TRACT | Status: AC
  Filled 2011-03-17: qty 20

## 2011-03-17 MED ORDER — ALBUTEROL (5 MG/ML) CONTINUOUS INHALATION SOLN
10.0000 mg | INHALATION_SOLUTION | RESPIRATORY_TRACT | Status: DC
  Administered 2011-03-17: 10 mg via RESPIRATORY_TRACT

## 2011-03-17 MED ORDER — PREDNISONE (PAK) 10 MG PO TABS: 10.0000 mg | ORAL_TABLET | Freq: Every day | ORAL | Status: AC

## 2011-03-17 MED ORDER — ALBUTEROL SULFATE (5 MG/ML) 0.5% IN NEBU
INHALATION_SOLUTION | RESPIRATORY_TRACT | Status: AC
  Administered 2011-03-17: 2.5 mg via RESPIRATORY_TRACT
  Filled 2011-03-17: qty 0.5

## 2011-03-17 MED ORDER — ALBUTEROL SULFATE (5 MG/ML) 0.5% IN NEBU
INHALATION_SOLUTION | RESPIRATORY_TRACT | Status: AC
  Administered 2011-03-17: 5 mg via RESPIRATORY_TRACT
  Filled 2011-03-17: qty 1

## 2011-03-17 NOTE — ED Notes (Signed)
Ambulated with pulse ox attached - sats remain 97-98% RA; no resp distress noted with ambulation; slight expiratory wheezing noted before, during and after ambulation; however, pt reports feeling "better" - MD aware of same - plan is to d/c pt to home - pt in agreement

## 2011-03-17 NOTE — Discharge Instructions (Signed)
Asthma Attack Prevention HOW CAN ASTHMA BE PREVENTED? Currently, there is no way to prevent asthma from starting. However, you can take steps to control the disease and prevent its symptoms after you have been diagnosed. Learn about your asthma and how to control it. Take an active role to control your asthma by working with your caregiver to create and follow an asthma action plan. An asthma action plan guides you in taking your medicines properly, avoiding factors that make your asthma worse, tracking your level of asthma control, responding to worsening asthma, and seeking emergency care when needed. To track your asthma, keep records of your symptoms, check your peak flow number using a peak flow meter (handheld device that shows how well air moves out of your lungs), and get regular asthma checkups.  Other ways to prevent asthma attacks include:  Use medicines as your caregiver directs.   Identify and avoid things that make your asthma worse (as much as you can).   Keep track of your asthma symptoms and level of control.   Get regular checkups for your asthma.   With your caregiver, write a detailed plan for taking medicines and managing an asthma attack. Then be sure to follow your action plan. Asthma is an ongoing condition that needs regular monitoring and treatment.   Identify and avoid asthma triggers. A number of outdoor allergens and irritants (pollen, mold, cold air, air pollution) can trigger asthma attacks. Find out what causes or makes your asthma worse, and take steps to avoid those triggers (see below).   Monitor your breathing. Learn to recognize warning signs of an attack, such as slight coughing, wheezing or shortness of breath. However, your lung function may already decrease before you notice any signs or symptoms, so regularly measure and record your peak airflow with a home peak flow meter.   Identify and treat attacks early. If you act quickly, you're less likely to have  a severe attack. You will also need less medicine to control your symptoms. When your peak flow measurements decrease and alert you to an upcoming attack, take your medicine as instructed, and immediately stop any activity that may have triggered the attack. If your symptoms do not improve, get medical help.   Pay attention to increasing quick-relief inhaler use. If you find yourself relying on your quick-relief inhaler (such as albuterol), your asthma is not under control. See your caregiver about adjusting your treatment.  IDENTIFY AND CONTROL FACTORS THAT MAKE YOUR ASTHMA WORSE A number of common things can set off or make your asthma symptoms worse (asthma triggers). Keep track of your asthma symptoms for several weeks, detailing all the environmental and emotional factors that are linked with your asthma. When you have an asthma attack, go back to your asthma diary to see which factor, or combination of factors, might have contributed to it. Once you know what these factors are, you can take steps to control many of them.  Allergies: If you have allergies and asthma, it is important to take asthma prevention steps at home. Asthma attacks (worsening of asthma symptoms) can be triggered by allergies, which can cause temporary increased inflammation of your airways. Minimizing contact with the substance to which you are allergic will help prevent an asthma attack. Animal Dander:   Some people are allergic to the flakes of skin or dried saliva from animals with fur or feathers. Keep these pets out of your home.   If you can't keep a pet outdoors, keep the   pet out of your bedroom and other sleeping areas at all times, and keep the door closed.   Remove carpets and furniture covered with cloth from your home. If that is not possible, keep the pet away from fabric-covered furniture and carpets.  Dust Mites:  Many people with asthma are allergic to dust mites. Dust mites are tiny bugs that are found in  every home, in mattresses, pillows, carpets, fabric-covered furniture, bedcovers, clothes, stuffed toys, fabric, and other fabric-covered items.   Cover your mattress in a special dust-proof cover.   Cover your pillow in a special dust-proof cover, or wash the pillow each week in hot water. Water must be hotter than 130 F to kill dust mites. Cold or warm water used with detergent and bleach can also be effective.   Wash the sheets and blankets on your bed each week in hot water.   Try not to sleep or lie on cloth-covered cushions.   Call ahead when traveling and ask for a smoke-free hotel room. Bring your own bedding and pillows, in case the hotel only supplies feather pillows and down comforters, which may contain dust mites and cause asthma symptoms.   Remove carpets from your bedroom and those laid on concrete, if you can.   Keep stuffed toys out of the bed, or wash the toys weekly in hot water or cooler water with detergent and bleach.  Cockroaches:  Many people with asthma are allergic to the droppings and remains of cockroaches.   Keep food and garbage in closed containers. Never leave food out.   Use poison baits, traps, powders, gels, or paste (for example, boric acid).   If a spray is used to kill cockroaches, stay out of the room until the odor goes away.  Indoor Mold:  Fix leaky faucets, pipes, or other sources of water that have mold around them.   Clean moldy surfaces with a cleaner that has bleach in it.  Pollen and Outdoor Mold:  When pollen or mold spore counts are high, try to keep your windows closed.   Stay indoors with windows closed from late morning to afternoon, if you can. Pollen and some mold spore counts are highest at that time.   Ask your caregiver whether you need to take or increase anti-inflammatory medicine before your allergy season starts.  Irritants:   Tobacco smoke is an irritant. If you smoke, ask your caregiver how you can quit. Ask family  members to quit smoking, too. Do not allow smoking in your home or car.   If possible, do not use a wood-burning stove, kerosene heater, or fireplace. Minimize exposure to all sources of smoke, including incense, candles, fires, and fireworks.   Try to stay away from strong odors and sprays, such as perfume, talcum powder, hair spray, and paints.   Decrease humidity in your home and use an indoor air cleaning device. Reduce indoor humidity to below 60 percent. Dehumidifiers or central air conditioners can do this.   Try to have someone else vacuum for you once or twice a week, if you can. Stay out of rooms while they are being vacuumed and for a short while afterward.   If you vacuum, use a dust mask from a hardware store, a double-layered or microfilter vacuum cleaner bag, or a vacuum cleaner with a HEPA filter.   Sulfites in foods and beverages can be irritants. Do not drink beer or wine, or eat dried fruit, processed potatoes, or shrimp if they cause asthma   symptoms.   Cold air can trigger an asthma attack. Cover your nose and mouth with a scarf on cold or windy days.   Several health conditions can make asthma more difficult to manage, including runny nose, sinus infections, reflux disease, psychological stress, and sleep apnea. Your caregiver will treat these conditions, as well.   Avoid close contact with people who have a cold or the flu, since your asthma symptoms may get worse if you catch the infection from them. Wash your hands thoroughly after touching items that may have been handled by people with a respiratory infection.   Get a flu shot every year to protect against the flu virus, which often makes asthma worse for days or weeks. Also get a pneumonia shot once every five to 10 years.  Drugs:  Aspirin and other painkillers can cause asthma attacks. 10% to 20% of people with asthma have sensitivity to aspirin or a group of painkillers called non-steroidal anti-inflammatory drugs  (NSAIDS), such as ibuprofen and naproxen. These drugs are used to treat pain and reduce fevers. Asthma attacks caused by any of these medicines can be severe and even fatal. These drugs must be avoided in people who have known aspirin sensitive asthma. Products with acetaminophen are considered safe for people who have asthma. It is important that people with aspirin sensitivity read labels of all over-the-counter drugs used to treat pain, colds, coughs, and fever.   Beta blockers and ACE inhibitors are other drugs which you should discuss with your caregiver, in relation to your asthma.  ALLERGY SKIN TESTING  Ask your asthma caregiver about allergy skin testing or blood testing (RAST test) to identify the allergens to which you are sensitive. If you are found to have allergies, allergy shots (immunotherapy) for asthma may help prevent future allergies and asthma. With allergy shots, small doses of allergens (substances to which you are allergic) are injected under your skin on a regular schedule. Over a period of time, your body may become used to the allergen and less responsive with asthma symptoms. You can also take measures to minimize your exposure to those allergens. EXERCISE  If you have exercise-induced asthma, or are planning vigorous exercise, or exercise in cold, humid, or dry environments, prevent exercise-induced asthma by following your caregiver's advice regarding asthma treatment before exercising. Document Released: 12/11/2008 Document Revised: 12/12/2010 Document Reviewed: 12/11/2008 [facility] Patient Information 2012 [place].  B?nh Suy?n ? Ng??i L?n (Asthma, Adult) B?nh suy?n l do ???ng d?n khng kh t?i ph?i b? thu h?p. B?nh ny c th? pht sinh do ph?n, b?i, lng th, ??t x?p, m?t s? th?c ph?m, nhi?m trng ???ng h h?p, ti?p xc v?i khi, t?p th? d?c, tnh tr?ng c?ng th?ng tinh th?n ho?c cc ch?t gy d? ?ng khc (nh?ng th? gy ph?n ?ng d? ?ng ho?c d? ?ng). Nh?ng c?n suy?n  l?p l?i l ph? bi?n. H??NG D?N CH?M [place] T?I NH  U?ng thu?c c k ??n theo yu c?u c?a bc s?Hessie Diener ti?p xc v?i ph?n, b?i, lng ??ng v?t, ??t x?p, khi v nh?ng th? khc c th? gy ra nh?ng c?n hen suy?n ? nh v t?i n?i lm vi?c.   B?n c th? t b? nh?ng c?n suy?n h?n n?u b?n gi?m b?t b?i trong nh. My ht b?i khng kh b?ng t?nh ?i?n c th? gip b?n.   Thay g?i ho?c n?m b?ng nh?ng ch?t li?u t gy d? ?ng h?n c th? gip cho b?n.   Bo cho Bc  s? bi?t k? ho?ch x? tr c?n suy?n t?i nh bao g?m c? vi?c s? d?ng d?ng c? ?o thng kh t?i ?a gip ?nh gi m?c ?? n?ng c?a c?n suy?n. K? ho?ch x? tr c th? gip gi?m ??n m?c t?i thi?u hay ch?m d?t c?n suy?n m khng c?n nh? ??n s? tr? gip v? y t?.   N?u khng b? h?n ch? s? d?ng n??c, hy u?ng 8 ??n 10 ly n??c m?i ngy.   Lun lun c k? ho?ch chu?n b? nh? ??n s? tr? gip v? y t? bao g?m c? vi?c g?i ?i?n tho?i cho Bc s?, ??n trung tm c?p c?u khu v?c, v g?i ?i?n tho?i s? 911 cho nh?ng c?n suy?n tr?m tr?ng.   Trao ??i v?i bc s? c?a b?n v? sinh ho?t t?p th? d?c ??u ??n m b?n c th? lm.   N?u nguyn nhn gy suy?n l lng th, c th? b?n s? ph?i ng?ng nui ??ng v?t trong nh.  HY THAM V?N V?I CHUYN GIA Y T? N?U:  B?n th? kh kh ho?c th? g?p ngay c? khi ? u?ng thu?c ?? phng ng?a nh?ng c?n suy?n.   B?n b? ?au c?, ?au ng?c ho?c ??m tr? nn ??c l?i.   ??m c?a b?n chuy?n t? mu trong ho?c tr?ng sang vng, xanh, xm ho?c c mu.   B?n c b?t k? tr?c tr?c no c th? lin quan ??n lo?i thu?c b?n ?ang u?ng (v d? nh? pht ban, ng?a, s?ng ho?c kh th?).  HY NGAY L?P T?C THAM V?N V?I CHUYN GIA Y T? N?U:  Lo?i thu?c b?n th??ng dng khng lm b?n h?t th? kh kh, ho?c b?n ngy cng b? ho ho?c th? g?p nhi?u h?n.   B?n ngy cng kh th?.   B?n b? s?t.  HY CH?C CH?N R?NG B?N:  Hi?u r nh?ng h??ng d?n khi xu?t vi?n.   S? theo di tnh tr?ng b?nh c?a b?n.   S? ??n khm b?nh ngay l?p t?c nh? ? ???c h??ng d?n.  Document Released:  12/23/2004 Document Revised: 12/12/2010 [facility] Patient Information 2012 [place].B?nh Suy?n ? Ng??i L?n (Asthma, Adult) B?nh suy?n l do ???ng d?n khng kh t?i ph?i b? thu h?p. B?nh ny c th? pht sinh do ph?n, b?i, lng th, ??t x?p, m?t s? th?c ph?m, nhi?m trng ???ng h h?p, ti?p xc v?i khi, t?p th? d?c, tnh tr?ng c?ng th?ng tinh th?n ho?c cc ch?t gy d? ?ng khc (nh?ng th? gy ph?n ?ng d? ?ng ho?c d? ?ng). Nh?ng c?n suy?n l?p l?i l ph? bi?n. H??NG D?N CH?M [place] T?I NH  U?ng thu?c c k ??n theo yu c?u c?a bc s?Hessie Diener ti?p xc v?i ph?n, b?i, lng ??ng v?t, ??t x?p, khi v nh?ng th? khc c th? gy ra nh?ng c?n hen suy?n ? nh v t?i n?i lm vi?c.   B?n c th? t b? nh?ng c?n suy?n h?n n?u b?n gi?m b?t b?i trong nh. My ht b?i khng kh b?ng t?nh ?i?n c th? gip b?n.   Thay g?i ho?c n?m b?ng nh?ng ch?t li?u t gy d? ?ng h?n c th? gip cho b?n.   Bo cho Bc s? bi?t k? ho?ch x? tr c?n suy?n t?i nh bao g?m c? vi?c s? d?ng d?ng c? ?o thng kh t?i ?a gip ?nh gi m?c ?? n?ng c?a c?n suy?n. K? ho?ch x? tr c th? gip gi?m ??n m?c t?i thi?u hay ch?m d?t c?n suy?n m khng c?n nh? ??n  s? tr? gip v? y t?.   N?u khng b? h?n ch? s? d?ng n??c, hy u?ng 8 ??n 10 ly n??c m?i ngy.   Lun lun c k? ho?ch chu?n b? nh? ??n s? tr? gip v? y t? bao g?m c? vi?c g?i ?i?n tho?i cho Bc s?, ??n trung tm c?p c?u khu v?c, v g?i ?i?n tho?i s? 911 cho nh?ng c?n suy?n tr?m tr?ng.   Trao ??i v?i bc s? c?a b?n v? sinh ho?t t?p th? d?c ??u ??n m b?n c th? lm.   N?u nguyn nhn gy suy?n l lng th, c th? b?n s? ph?i ng?ng nui ??ng v?t trong nh.  HY THAM V?N V?I CHUYN GIA Y T? N?U:  B?n th? kh kh ho?c th? g?p ngay c? khi ? u?ng thu?c ?? phng ng?a nh?ng c?n suy?n.   B?n b? ?au c?, ?au ng?c ho?c ??m tr? nn ??c l?i.   ??m c?a b?n chuy?n t? mu trong ho?c tr?ng sang vng, xanh, xm ho?c c mu.   B?n c b?t k? tr?c tr?c no c th? lin quan ??n lo?i thu?c b?n ?ang  u?ng (v d? nh? pht ban, ng?a, s?ng ho?c kh th?).  HY NGAY L?P T?C THAM V?N V?I CHUYN GIA Y T? N?U:  Lo?i thu?c b?n th??ng dng khng lm b?n h?t th? kh kh, ho?c b?n ngy cng b? ho ho?c th? g?p nhi?u h?n.   B?n ngy cng kh th?.   B?n b? s?t.  HY CH?C CH?N R?NG B?N:  Hi?u r nh?ng h??ng d?n khi xu?t vi?n.   S? theo di tnh tr?ng b?nh c?a b?n.   S? ??n khm b?nh ngay l?p t?c nh? ? ???c h??ng d?n.  Document Released: 12/23/2004 Document Revised: 12/12/2010 [facility] Patient Information 2012 [place].

## 2011-03-17 NOTE — ED Notes (Signed)
Pt ambulated to bathroom. Pt is wheezing and breathless and had to be returned to rm after using restroom in a wheelchair.

## 2011-03-17 NOTE — ED Provider Notes (Signed)
History     CSN: [id number]  Arrival date & time 03/17/11  1351   First MD Initiated Contact with Patient 03/17/11 1605      Chief Complaint  Patient presents with  . Shortness of Breath    (Consider location/radiation/quality/duration/timing/severity/associated sxs/prior treatment) The history is provided by the patient and a relative. The history is limited by a language barrier. A language interpreter was used.    66 year old female who was recently diagnosed with pneumonia 2 weeks ago is presenting to the ED with a chief complaints of shortness of breath.  Recently treated with Avelox.  The history was obtained through patient's son who is available at bedside. The patient initially felt significant improvement after Avelox. She did finish her medication and has been finished for the past 10 days. For the past 3 or 4 days she has been having increase cough productive of white sputum, and wheezing. Patient felt fatigue, and is having pleuritic chest pain.  As fever, headache, nausea, vomiting, diarrhea, abdominal pain, rash. She has history of diabetes. She is a former smoker. She has been taken all of her medications as prescribed.   Past Medical History  Diagnosis Date  . Diabetes mellitus   . Hypertension     No past surgical history on file.  No family history on file.  History  Substance Use Topics  . Smoking status: Former Research scientist (life sciences)  . Smokeless tobacco: Not on file  . Alcohol Use: No    OB History    Grav Para Term Preterm Abortions TAB SAB Ect Mult Living                  Review of Systems  All other systems reviewed and are negative.    Allergies  Review of patient's allergies indicates no known allergies.  Home Medications   Current Outpatient Rx  Name Route Sig Dispense Refill  . ALBUTEROL SULFATE HFA 108 (90 BASE) MCG/ACT IN AERS Inhalation Inhale 2 puffs into the lungs every 4 (four) hours as needed. As needed for shortness of breath.    .  BECLOMETHASONE DIPROPIONATE 80 MCG/ACT IN AERS Inhalation Inhale 2-4 puffs into the lungs every 4 (four) hours as needed. As needed for asthma.    . CETIRIZINE HCL 10 MG PO TABS Oral Take 5 mg by mouth daily.    Marland Kitchen GLIPIZIDE ER 10 MG PO TB24 Oral Take 20 mg by mouth every morning.    Marland Kitchen LISINOPRIL 10 MG PO TABS Oral Take 10 mg by mouth at bedtime.    Marland Kitchen METFORMIN HCL 1000 MG PO TABS Oral Take 1,000 mg by mouth 2 (two) times daily with a meal.    . MUCINEX FAST-MAX COLD & SINUS PO Oral Take 2 tablets by mouth every 6 (six) hours as needed. As needed for congestion.      BP 138/100  Pulse 120  Resp 24  SpO2 95%  Physical Exam  Nursing note and vitals reviewed. Constitutional: She appears well-developed and well-nourished. No distress.       Awake, alert, nontoxic appearance  HENT:  Head: Atraumatic.  Eyes: Conjunctivae are normal. Right eye exhibits no discharge. Left eye exhibits no discharge.  Neck: Neck supple.  Cardiovascular: Normal rate and regular rhythm.   Pulmonary/Chest: Effort normal. No accessory muscle usage. No respiratory distress. She has decreased breath sounds. She has wheezes. She has rhonchi. She has no rales. She exhibits no tenderness.  Abdominal: Soft. There is no tenderness. There is no rebound.  Musculoskeletal: She exhibits no tenderness.       ROM appears intact, no obvious focal weakness.  No pedal edema.    Neurological:       Mental status and motor strength appears intact  Skin: No rash noted.  Psychiatric: She has a normal mood and affect.    ED Course  Procedures (including critical care time)  Labs Reviewed - No data to display No results found.   No diagnosis found.   Date: 03/17/2011  Rate: 106  Rhythm: sinus tachycardia  QRS Axis: normal  Intervals: normal  ST/T Wave abnormalities: normal  Conduction Disutrbances:none  Narrative Interpretation:   Old EKG Reviewed: unchanged  Results for orders placed during the hospital encounter of  03/17/11  CBC      Component Value Range   WBC 14.4 (*) 4.0 - 10.5 (K/uL)   RBC 3.74 (*) 3.87 - 5.11 (MIL/uL)   Hemoglobin 11.0 (*) 12.0 - 15.0 (g/dL)   HCT 32.0 (*) 36.0 - 46.0 (%)   MCV 85.6  78.0 - 100.0 (fL)   MCH 29.4  26.0 - 34.0 (pg)   MCHC 34.4  30.0 - 36.0 (g/dL)   RDW 13.0  11.5 - 15.5 (%)   Platelets 228  150 - 400 (K/uL)  DIFFERENTIAL      Component Value Range   Neutrophils Relative 74  43 - 77 (%)   Neutro Abs 10.7 (*) 1.7 - 7.7 (K/uL)   Lymphocytes Relative 15  12 - 46 (%)   Lymphs Abs 2.2  0.7 - 4.0 (K/uL)   Monocytes Relative 5  3 - 12 (%)   Monocytes Absolute 0.7  0.1 - 1.0 (K/uL)   Eosinophils Relative 5  0 - 5 (%)   Eosinophils Absolute 0.8 (*) 0.0 - 0.7 (K/uL)   Basophils Relative 1  0 - 1 (%)   Basophils Absolute 0.1  0.0 - 0.1 (K/uL)  BASIC METABOLIC PANEL      Component Value Range   Sodium 140  135 - 145 (mEq/L)   Potassium 3.7  3.5 - 5.1 (mEq/L)   Chloride 104  96 - 112 (mEq/L)   CO2 24  19 - 32 (mEq/L)   Glucose, Bld 65 (*) 70 - 99 (mg/dL)   BUN 15  6 - 23 (mg/dL)   Creatinine, Ser 1.15 (*) 0.50 - 1.10 (mg/dL)   Calcium 8.7  8.4 - 10.5 (mg/dL)   GFR calc non Af Amer 48 (*) >90 (mL/min)   GFR calc Af Amer 56 (*) >90 (mL/min)  URINALYSIS, ROUTINE W REFLEX MICROSCOPIC      Component Value Range   Color, Urine YELLOW  YELLOW    APPearance CLEAR  CLEAR    Specific Gravity, Urine 1.011  1.005 - 1.030    pH 5.5  5.0 - 8.0    Glucose, UA NEGATIVE  NEGATIVE (mg/dL)   Hgb urine dipstick NEGATIVE  NEGATIVE    Bilirubin Urine NEGATIVE  NEGATIVE    Ketones, ur NEGATIVE  NEGATIVE (mg/dL)   Protein, ur NEGATIVE  NEGATIVE (mg/dL)   Urobilinogen, UA 0.2  0.0 - 1.0 (mg/dL)   Nitrite NEGATIVE  NEGATIVE    Leukocytes, UA TRACE (*) NEGATIVE   POCT I-STAT TROPONIN I      Component Value Range   Troponin i, poc 0.00  0.00 - 0.08 (ng/mL)   Comment 3           URINE MICROSCOPIC-ADD ON      Component Value Range  Squamous Epithelial / LPF RARE  RARE    WBC,  UA 0-2  <3 (WBC/hpf)   Bacteria, UA RARE  RARE    Casts HYALINE CASTS (*) NEGATIVE    Dg Chest 2 View  03/17/2011  *RADIOLOGY REPORT*  Clinical Data: Cough, generalized weakness, former smoking history  CHEST - 2 VIEW  Comparison: Chest x-ray of 03/03/2011  Findings: No active infiltrate or effusion is seen.  Mild cardiomegaly is stable.  No acute bony abnormality is seen.  IMPRESSION: Stable mild cardiomegaly.  No active lung disease.  Original Report Authenticated By: Joretta Bachelor, M.D.   Dg Chest 2 View  03/03/2011  *RADIOLOGY REPORT*  Clinical Data: Chest pain  CHEST - 2 VIEW  Comparison: 07/20/2010  Findings: Heart size is normal.  Mediastinal shadows are normal. Vascularity is normal.  No effusions.  I question if there is mild patchy density in the right upper lobe at the left base.  This is not definite.  No significant bony finding.  IMPRESSION: No active disease no definite active disease.  Question early / mild patchy infiltrates in the right upper lobe and left lower lobe.  Original Report Authenticated By: Jules Schick, M.D.      MDM  Increased shortness of breath and wheezes, with recent diagnosis of community acquired pneumonia. Patient has history of asthma and used proventil at home.  She denies nausea vomiting or diarrhea. She appears to be well hydrated. Her chest pain worsened with cough. Repeat chest x-ray, obtained diagnostic lab work, due to tachycardia.  Also did appear raw and Atrovent breathing treatment due to increased wheezing. Cough medication given. IV fluid given for tachycardia. Discussed care with my attending.    4:35 PM No significant changes on ECG.  Peak flow improves with breathing treatment. Repeat chest x-ray shows no evidence of pneumonia. Her EKG shows no changes, and her troponin is negative.   She has an elevated white count of 14.4 no left shift. She has not been taking any prednisone.  Will check a UA.      7:36 PM A negative urine.  She  continues to endorse wheezing.  Will be moved to the CDU under asthma protocol, for symptomatic improvement prior to discharge.  Will give cough medication for persistent cough.  Low suspicion for PE.    8:15 PM Currently receiving hr long nebs.  Will continue to monitor.    10:27 PM States she felt much better after breathing treatments. Tachycardic, likely secondary to albuterol treatment.  Early in no acute respiratory distress. Decreased wheezes on lung examination. Rest fluids, able to tolerate by mouth. I plan to discharge patient with steroid taper course, and albuterol nebs.  Has rescue inhaler at home.  [facility], which I encouraged her to follow up promptly.  11:53 PM Patient felt subjectively better. She maintains oxygenation while ambulating. Discharge instruction given  Domenic Moras, PA-C 03/17/11 2353

## 2011-03-17 NOTE — ED Notes (Signed)
Up to restroom without event

## 2011-03-17 NOTE — ED Notes (Signed)
Respiratory therapist notified of order for initiation of asthma respiratory protocol; pt audibly wheezing, resp slightly labored at 36/min; nonprod cough notified; family at bedside

## 2011-03-17 NOTE — ED Notes (Signed)
Pt currently on continuous nebs

## 2011-03-17 NOTE — ED Notes (Signed)
Wheezing x 3 days worse this am

## 2011-03-17 NOTE — ED Provider Notes (Signed)
CDU admission note  66 year old female with history of asthma presents with shortness of breath and occasional chest pain. Chest pain occurs only with coughing no. Patient has obvious wheezing on presentation and is improving with albuterol treatments. She did have negative troponin, normal EKG, and normal chest x-ray. Of note patient had recently been treated with Avelox for community-acquired pneumonia. She was given prednisone here and will be transferred to the CDU for further treatment of her obstructive lung disease.  CV: Mild tachycardia between 101 10, regular rhythm, no murmurs rubs or gallops, no peripheral edema Pulm: Diffuse wheezing throughout. No crackles, rales, rhonchi.  A: 66 yo F with asthma exacerbation.    Chauncy Passy, MD 03/17/11 [phone]

## 2011-03-19 NOTE — ED Provider Notes (Addendum)
66 y.o.femalewho presents with shortness of breath  CV: tachy with RR, no m/r/g, no pitting edema of the lower extremities Pulm:Diffuse wheezing GI: SNTND, + BS, no guarding or rebound Skin: no rashes noted MSK: moves all 4 extremities symmetrically, no deformities or injuries noted Neuro: CN 2-12 intact, no abnormalities of strength or sensation, A and O x 3  Will admit to CDU for continued breathing treatments.  Medical screening examination/treatment/procedure(s) were conducted as a shared visit with non-physician practitioner(s) and myself.  I personally evaluated the patient during the encounter       Chauncy Passy, MD 03/19/11 [place], MD 03/19/11 [phone]

## 2011-04-04 ENCOUNTER — Emergency Department (HOSPITAL_COMMUNITY)
Admission: EM | Admit: 2011-04-04 | Discharge: 2011-04-04 | Disposition: A | Discharge status: Home / Self Care | Payer: Medicare Other | Attending: Emergency Medicine | Admitting: Emergency Medicine

## 2011-04-04 ENCOUNTER — Emergency Department (HOSPITAL_COMMUNITY): Payer: Medicare Other

## 2011-04-04 ENCOUNTER — Encounter (HOSPITAL_COMMUNITY): Payer: Self-pay | Admitting: *Deleted

## 2011-04-04 DIAGNOSIS — I10 Essential (primary) hypertension: Secondary | ICD-10-CM | POA: Insufficient documentation

## 2011-04-04 DIAGNOSIS — R109 Unspecified abdominal pain: Secondary | ICD-10-CM | POA: Insufficient documentation

## 2011-04-04 DIAGNOSIS — K5732 Diverticulitis of large intestine without perforation or abscess without bleeding: Secondary | ICD-10-CM | POA: Insufficient documentation

## 2011-04-04 DIAGNOSIS — K5792 Diverticulitis of intestine, part unspecified, without perforation or abscess without bleeding: Secondary | ICD-10-CM

## 2011-04-04 DIAGNOSIS — E119 Type 2 diabetes mellitus without complications: Secondary | ICD-10-CM | POA: Insufficient documentation

## 2011-04-04 LAB — URINALYSIS, ROUTINE W REFLEX MICROSCOPIC
Glucose, UA: NEGATIVE mg/dL
Hgb urine dipstick: NEGATIVE
Protein, ur: NEGATIVE mg/dL
pH: 6 (ref 5.0–8.0)

## 2011-04-04 LAB — CBC
Hemoglobin: 11.8 g/dL — ABNORMAL LOW (ref 12.0–15.0)
MCH: 29.7 pg (ref 26.0–34.0)
Platelets: 238 10*3/uL (ref 150–400)
RBC: 3.97 MIL/uL (ref 3.87–5.11)
WBC: 12.6 10*3/uL — ABNORMAL HIGH (ref 4.0–10.5)

## 2011-04-04 LAB — BASIC METABOLIC PANEL
CO2: 26 mEq/L (ref 19–32)
Chloride: 104 mEq/L (ref 96–112)
Glucose, Bld: 118 mg/dL — ABNORMAL HIGH (ref 70–99)
Sodium: 140 mEq/L (ref 135–145)

## 2011-04-04 MED ORDER — ONDANSETRON HCL 4 MG/2ML IJ SOLN
4.0000 mg | Freq: Once | INTRAMUSCULAR | Status: AC
  Administered 2011-04-04: 4 mg via INTRAVENOUS
  Filled 2011-04-04: qty 2

## 2011-04-04 MED ORDER — CIPROFLOXACIN HCL 500 MG PO TABS: 500.0000 mg | ORAL_TABLET | Freq: Two times a day (BID) | ORAL | Status: AC

## 2011-04-04 MED ORDER — IOHEXOL 300 MG/ML  SOLN: 80.0000 mL | Freq: Once | INTRAMUSCULAR | Status: DC | PRN

## 2011-04-04 MED ORDER — METRONIDAZOLE 500 MG PO TABS: 500.0000 mg | ORAL_TABLET | Freq: Three times a day (TID) | ORAL | Status: AC

## 2011-04-04 MED ORDER — OXYCODONE-ACETAMINOPHEN 5-325 MG PO TABS
2.0000 | ORAL_TABLET | Freq: Once | ORAL | Status: AC
  Administered 2011-04-04: 2 via ORAL
  Filled 2011-04-04: qty 2

## 2011-04-04 MED ORDER — ONDANSETRON HCL 4 MG PO TABS: 4.0000 mg | ORAL_TABLET | Freq: Four times a day (QID) | ORAL | Status: AC

## 2011-04-04 MED ORDER — OXYCODONE-ACETAMINOPHEN 5-325 MG PO TABS: 1.0000 | ORAL_TABLET | Freq: Four times a day (QID) | ORAL | Status: AC | PRN

## 2011-04-04 MED ORDER — ONDANSETRON 4 MG PO TBDP
4.0000 mg | ORAL_TABLET | Freq: Once | ORAL | Status: AC
  Administered 2011-04-04: 4 mg via ORAL
  Filled 2011-04-04: qty 1

## 2011-04-04 NOTE — Discharge Instructions (Signed)
Vim Ti Th?a (Diverticulitis)  Ti th?a l m?t ti nh? ho?c ti trn ??i trng. Vim ti th?a l s? hi?n di?n c?a chi nang trn ??i trng. Vim ti th?a l s? kch ?ng (vim) ho?c nhi?m trng c?a chi nang.  NGUYN NHN ??i trng v cc ti th?a l n?i tr ng? c?a cc m?m b?nh (vi trng). N?u cc m?nh v?n th?c ?n lm t?c ngh?n nh?ng l? thng nh? c?a ti th?a, vi trng bn trong c th? sinh si v lm t?ng p l?c ti th?a. ?i?u ny d?n ??n nhi?m trng v vim nhi?m v ???c g?i l vim ti th?a. TRI?U CH?NG  ?au v nh?y c?m ?au ? b?ng. Th??ng hay b? ?au ? bn tri b?ng. Tuy nhin, n c th? b? ? ch? khc.   S?t.   ??y h?i.   C?m th?y kh ch?u trong d? dy (bu?n nn).   i (nn m?a).   Phn khng bnh th??ng.  CH?N ?ON B?nh s? v th?m khm lm sng s? gip ch? ra ch?n ?on vim ti th?a. Do c nhi?u nguyn nhn gy ra ?au b?ng, c th? c?n thm cc xt nghi?m khc. Cc xt nghi?m c th? bao g?m:  Xt nghi?m mu.   Xt nghi?m n??c ti?u.   Ch?p X quang b?ng.   Ch?p CT b?ng.  ?i khi c?n ph?u thu?t ?? xc ??nh xem c ph?i do vim ti th?a hay cc tnh tr?ng b?nh l khc gy ra cc tri?u ch?ng c?a b?n. ?I?U TR? ?a s? tr??ng h?p, b?nh nhn c th? ???c ?i?u tr? m khng c?n ph?u thu?t. Vi?c ?i?u tr? bao g?m:  ?? cho ru?t ngh? ng?i b?ng cch h?n ch? ch? dng cc th?c ?n l?ng trong vi ngy. Khi ?? h?n, b?n s? c?n ?n m?t ch? ?? ?n t ch?t x?.   Cc lo?i d?ch truy?n b?ng ???ng t?nh m?ch n?u b? m?t n??c.   Khng sinh ?i?u tr? nhi?m trng.   Thu?c gi?m ?au v ch?ng bu?n nn n?u c?n thi?t.   B?n c th? c?n ph?u thu?t n?u n? chi nang b? vim.  H??NG D?N CH?M [place] T?I NH  Th? ch? ?? ?n u?ng b?ng ch?t l?ng trong (canh, ch ho?c n??c trong mi?n l theo s? ch? d?n c?a chuyn gia ch?m [place] y t? c?a b?n). Sau ?, b?n c th? d?n d?n b?t ??u ch? ?? ?n t ch?t x? ty theo s? ph?n ?ng c?a c? th?. Ch? ?? ?n t ch?t x? l ch? ?? ?n c t h?n 10 gam ch?t x?. Ch?n cc lo?i th?c ph?m d??i ?y ?? lm gi?m  ch?t x? trong ch? ?? ?n:   Bnh m tr?ng, ng? c?c, g?o v m ?ng.   Tri cy v rau ???c n?u chn ho?c tri cy t??i v rau qu? m?m m khng c v?.   Th?t b m?m ???c nghi?n ho?c n?u chn k?, gi?m bng, th?t b, th?t c?u, th?t l?n, gia c?m.   Tr?ng v h?i s?n.   Sau khi cc tri?u ch?ng vim ti th?a ? ???c c?i thi?n, bc s? c?a b?n c th? chuy?n b?n sang ch? ?? ?n nhi?u ch?t x?. Ch? ?? ?n nhi?u ch?t x? bao g?m 14 gam ch?t x? cho m?i 1.000 calo tiu th?. ??i v?i m?t ch? ?? ?n 2000 calo tiu chu?n, b?n s? c?n 28 gam ch?t x?. Th?c hi?n theo cc h??ng d?n ch? ?? ?n ny ?? gip b?n t?ng c??ng ch?t x? trong ch? ?? ?  n c?a mnh. ?i?u quan tr?ng l t? t? t?ng l??ng ch?t x? trong ch? ?? ?n c?a b?n ?? trnh kh, to bn v ??y h?i.   Ch?n bnh m nguyn h?t, ng? c?c, m ?ng v g?o nu.   Ch?n tri cy v rau t??i cn v?. Khng n?u rau qu chn v n?u rau cng lu th cng m?t nhi?u ch?t x?.   Ch?n nhi?u lo?i c? qu?, h?t, ??u H Lan kh v ??u l?ng.   Hy tm nh?ng s?n ph?m th?c ph?m c trn 3 gam ch?t x? m?i kh?u ph?n trn nhn Thnh Ph?n Dinh D??ng.   S? d?ng t?t c? thu?c theo ch? d?n c?a chuyn gia ch?m [place] y t?.   N?u chuyn gia ch?m [place] y t? c?a b?n h?n khm l?i, ?i?u quan tr?ng l b?n ph?i khm l?i. Vi?c khng ?i khm l?i c th? d?n ??n ch?n th??ng, ?au, tn t?t v?nh vi?n (mn tnh). N?u c b?t k? v?n ?? no v?i vi?c gi? cu?c h?n, hy g?i ?i?n ?? s?p x?p l?i cu?c h?n.  ?I KHM B?NH N?U:  ?au khng ?? nh? mong ??i.   Kh chuy?n ch? ?? ?n ngo?i tr? th?c ?n l?ng ton b?.   Nhu ??ng ru?t khng tr? v? bnh th??ng.  NH?P VI?N NGAY L?P T?C N?U:  C?n ?au tr? nn nghim tr?ng h?n.   Nhi?t ?? ?o ? mi?ng trn 102 F (38.9 C), khng gi?m sau khi dng thu?c.   i m?a lin t?c.   Mu l?n trong phn (?? t??i hay phn ?en s?t nh? h?c n).   N?u cc tri?u ch?ng m ? ??a b?n ?i khm Bc s? tr? nn x?u h?n hay khng ?? h?n.  HY CH?C CH?N R?NG B?N:  Hi?u r nh?ng h??ng d?n khi xu?t vi?n.   S?  theo di tnh tr?ng b?nh c?a b?n.   S? ??n khm b?nh ngay l?p t?c nh? ? ???c h??ng d?n.  Document Released: 10/02/2004 Document Revised: 12/12/2010 [facility] Patient Information 2012 [place].

## 2011-04-04 NOTE — ED Notes (Signed)
Pt received to [facility] 9 with c/o LLQ abdominal pain onset 2 days ago. Pt denies any N/V/D. Pt is A/A/Ox4 , skin is warm and dry, respiration is even and unlabored. Family is at the bedside

## 2011-04-04 NOTE — ED Provider Notes (Signed)
Medical screening examination/treatment/procedure(s) were conducted as a shared visit with non-physician practitioner(s) and myself.  I personally evaluated the patient during the encounter  Few days left-sided abdominal pain with moderate tenderness to both the left lower quadrant and left upper quadrant to my examination without rebound or associated symptoms the CT scan ordered to rule out diverticulitis.  Nichole Relic, MD 04/04/11 2242

## 2011-04-04 NOTE — ED Provider Notes (Signed)
History     CSN: [id number]  Arrival date & time 04/04/11  [id number]   First MD Initiated Contact with Patient 04/04/11 1041      Chief Complaint  Patient presents with  . Abdominal Pain    (Consider location/radiation/quality/duration/timing/severity/associated sxs/prior treatment) HPI  Pt presents to the ED with her son. She is non spanish speaking. She is here with complaints of left upper abdominal pain to palpation over the past 3 days. Her stomach does not hurt with movement or with resting but it hurts very bad when someone touches it. She denies recent injury or fall. She denies a history of the same. Her bowel movements have been regular. Her last bowel movement was this morning and it was normal. She denies nausea and vomiting. Denies pain at rest. Denies fevers, chills weakness, LOC, chest pain, SOB, wheezing, headaches or any other associated symptoms. Pt has multiple red marks to chest. Son informs me that it is a cultural treatment used to treat pain or fevers. The patient does not wince when I touch red marks.   Past Medical History  Diagnosis Date  . Diabetes mellitus   . Hypertension     History reviewed. No pertinent past surgical history.  History reviewed. No pertinent family history.  History  Substance Use Topics  . Smoking status: Former Research scientist (life sciences)  . Smokeless tobacco: Not on file  . Alcohol Use: No    OB History    Grav Para Term Preterm Abortions TAB SAB Ect Mult Living                  Review of Systems  All other systems reviewed and are negative.    Allergies  Review of patient's allergies indicates no known allergies.  Home Medications   Current Outpatient Rx  Name Route Sig Dispense Refill  . BECLOMETHASONE DIPROPIONATE 80 MCG/ACT IN AERS Inhalation Inhale 2-4 puffs into the lungs every 4 (four) hours as needed. As needed for asthma.    Marland Kitchen GABAPENTIN 300 MG PO CAPS Oral Take 300 mg by mouth 3 (three) times daily.    Marland Kitchen GLIPIZIDE ER 10 MG PO  TB24 Oral Take 20 mg by mouth every morning.    . IBUPROFEN 200 MG PO TABS Oral Take 200 mg by mouth every 6 (six) hours as needed. Pain.    Marland Kitchen LISINOPRIL 10 MG PO TABS Oral Take 10 mg by mouth at bedtime.    Marland Kitchen METFORMIN HCL 1000 MG PO TABS Oral Take 1,000 mg by mouth 2 (two) times daily with a meal.    . ALBUTEROL SULFATE HFA 108 (90 BASE) MCG/ACT IN AERS Inhalation Inhale 2 puffs into the lungs every 4 (four) hours as needed. As needed for shortness of breath.    . CETIRIZINE HCL 10 MG PO TABS Oral Take 5 mg by mouth daily.    Marland Kitchen CIPROFLOXACIN HCL 500 MG PO TABS Oral Take 1 tablet (500 mg total) by mouth 2 (two) times daily. 24 tablet 0  . METRONIDAZOLE 500 MG PO TABS Oral Take 1 tablet (500 mg total) by mouth 3 (three) times daily. 36 tablet 0  . ONDANSETRON HCL 4 MG PO TABS Oral Take 1 tablet (4 mg total) by mouth every 6 (six) hours. 12 tablet 0  . OXYCODONE-ACETAMINOPHEN 5-325 MG PO TABS Oral Take 1 tablet by mouth every 6 (six) hours as needed for pain. 16 tablet 0    BP 101/88  Pulse 88  Temp(Src) 98.3 F (36.8 C) (  Oral)  Resp 16  SpO2 99%  Physical Exam  Nursing note and vitals reviewed. Constitutional: She appears well-developed and well-nourished. No distress.  HENT:  Head: Normocephalic and atraumatic.  Eyes: Pupils are equal, round, and reactive to light.  Neck: Normal range of motion. Neck supple.  Cardiovascular: Normal rate and regular rhythm.   Pulmonary/Chest: Effort normal.  Abdominal: Soft. She exhibits no distension and no mass. There is tenderness (LUQ/left flank pain to palpation. No pain at rest or with movement). There is no rebound and no guarding.  Neurological: She is alert.  Skin: Skin is warm and dry.    ED Course  Procedures (including critical care time)  Labs Reviewed  CBC - Abnormal; Notable for the following:    WBC 12.6 (*)    Hemoglobin 11.8 (*)    HCT 34.4 (*)    All other components within normal limits  BASIC METABOLIC PANEL - Abnormal;  Notable for the following:    Glucose, Bld 118 (*)    Creatinine, Ser 1.24 (*)    GFR calc non Af Amer 44 (*)    GFR calc Af Amer 51 (*)    All other components within normal limits  URINALYSIS, ROUTINE W REFLEX MICROSCOPIC   Ct Abdomen Pelvis W Contrast  04/04/2011  *RADIOLOGY REPORT*  Clinical Data: Left abdominal pain  CT ABDOMEN AND PELVIS WITH CONTRAST  Technique:  Multidetector CT imaging of the abdomen and pelvis was performed following the standard protocol during bolus administration of intravenous contrast.  Contrast:  80 ml Omnipaque-300 IV  Comparison: [facility] CT abdomen pelvis dated 07/20/2010  Findings: Lung bases are essentially clear.  Focal fat deposition along the falciform ligament (series 2/image 22).  Spleen, pancreas, and adrenal glands are within normal limits.  Gallbladder is unremarkable.  No intrahepatic or extrahepatic ductal dilatation.  3 mm nonobstructing right lower pole calculus (series 2/image 39). Left kidney is within normal limits.  No hydronephrosis.  No evidence of bowel obstruction.  Normal appendix.  Colonic diverticulosis with inflammatory changes in the left abdomen (series 2/image 46), suggesting descending colonic diverticulitis.  No drainable fluid collection or abscess.  No free air.  Atherosclerotic calcifications of the abdominal aorta and branch vessels.  No abdominopelvic ascites.  No suspicious abdominopelvic lymphadenopathy.  Uterus and bilateral ovaries are unremarkable.  Bladder is mildly thick-walled but underdistended.  Mild degenerative changes of the visualized thoracolumbar spine. Grade 1 spondylolisthesis at L5-S1.  IMPRESSION: Descending colonic diverticulitis.  No drainable fluid collection or abscess.  No free air.  Original Report Authenticated By: Julian Hy, M.D.   Dg Abd Acute W/chest  04/04/2011  *RADIOLOGY REPORT*  Clinical Data: Left-sided abdominal pain and flank pain.  ACUTE ABDOMEN SERIES (ABDOMEN 2 VIEW & CHEST 1 VIEW)   Comparison: 07/20/2010 abdominal CT.  Findings: Low volume chest with basilar atelectasis.  Thoracic aortic tortuosity.  No free air underneath the hemidiaphragms. Nonobstructive bowel gas pattern.  Gas distended loop of small bowel in the left central abdomen.  No pathologic air fluid levels. Stool and bowel gas extends to the rectosigmoid.  Mild levoconvex lumbar scoliosis.  IMPRESSION:  1.  Low volume chest. 2.  Nonobstructive bowel gas pattern.  Original Report Authenticated By: Dereck Ligas, M.D.     1. Diverticulitis       MDM  Pt has been evaluated by Dr. Stevie Kern who would like CT abd/pelvis order to R/O diverticulitis.    Ct SCAN SHOWS DIVERTICULITIS.  Pt Rx for Cipro and  Flagyl x 12 days. Also, Percocets and Zofran. Pt given follow-up referral to [facility].  I have discussed the patient with my supervising attending who is aware of my work-up and plan. Pt has been advised of the symptoms that warrant their return to the ED. Patient has voiced understanding and has agreed to follow-up with the PCP or specialist.      Linus Mako, [place] 04/04/11 [place], [place] 04/04/11 [id number]

## 2011-04-04 NOTE — ED Notes (Signed)
Family at bedside for interpretation. Pt reports left sided abd pain x 3 days. Denies n/v/d. Reports increased urination at night.

## 2011-06-13 ENCOUNTER — Emergency Department (HOSPITAL_COMMUNITY)
Admission: EM | Admit: 2011-06-13 | Discharge: 2011-06-13 | Disposition: A | Discharge status: Home / Self Care | Payer: Medicare Other | Attending: Emergency Medicine | Admitting: Emergency Medicine

## 2011-06-13 ENCOUNTER — Encounter (HOSPITAL_COMMUNITY): Payer: Self-pay | Admitting: Emergency Medicine

## 2011-06-13 DIAGNOSIS — I10 Essential (primary) hypertension: Secondary | ICD-10-CM | POA: Insufficient documentation

## 2011-06-13 DIAGNOSIS — E119 Type 2 diabetes mellitus without complications: Secondary | ICD-10-CM | POA: Insufficient documentation

## 2011-06-13 DIAGNOSIS — R220 Localized swelling, mass and lump, head: Secondary | ICD-10-CM

## 2011-06-13 DIAGNOSIS — K047 Periapical abscess without sinus: Secondary | ICD-10-CM | POA: Insufficient documentation

## 2011-06-13 MED ORDER — HYDROCODONE-ACETAMINOPHEN 5-500 MG PO TABS: 1.0000 | ORAL_TABLET | Freq: Four times a day (QID) | ORAL | Status: AC | PRN

## 2011-06-13 MED ORDER — HYDROCODONE-ACETAMINOPHEN 5-325 MG PO TABS
1.0000 | ORAL_TABLET | Freq: Once | ORAL | Status: AC
  Administered 2011-06-13: 1 via ORAL
  Filled 2011-06-13: qty 1

## 2011-06-13 MED ORDER — PENICILLIN V POTASSIUM 500 MG PO TABS: 500.0000 mg | ORAL_TABLET | Freq: Four times a day (QID) | ORAL | Status: AC

## 2011-06-13 NOTE — ED Provider Notes (Signed)
History     CSN: [id number]  Arrival date & time 06/13/11  1730   First MD Initiated Contact with Patient 06/13/11 1827      Chief Complaint  Patient presents with  . 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 She denies any nausea vomiting, fever chills, or shortness of breath. She denies any sensation of neck swelling or difficulty breathing. She denies any changes in her hearing or vision. She calls her illness severe. Her pain level right now is 8/10. When chewing or moving her jaw is 10 out of 10. She's been taking nothing for her discomfort. On arrival temperature 99.7F, pulse 1:15, respirations 20, blood pressure 154/77, oxygen saturation 96% on room air  Past Medical History  Diagnosis Date  . Diabetes mellitus   . Hypertension     History reviewed. No pertinent past surgical history.  History reviewed. No pertinent family history.  History  Substance Use Topics  . Smoking status: Former Research scientist (life sciences)  . Smokeless tobacco: Not on file  . Alcohol Use: No    OB History    Grav Para Term Preterm Abortions TAB SAB Ect Mult Living                  Review of Systems Constitutional: Negative for fever and chills.  HENT: Negative for ear pain, sore throat and trouble swallowing.   Dental: POS for tooth pain.  Eyes: Negative for pain and visual disturbance.  Respiratory: Negative for cough and shortness of breath.   Cardiovascular: Negative for chest pain and leg swelling.  Gastrointestinal: Negative for nausea, vomiting, abdominal pain and diarrhea.  Genitourinary:  Negative for dysuria, urgency and frequency.  Musculoskeletal: Negative for back pain and joint swelling.  Skin: Negative for rash and wound.  Neurological: Negative for dizziness, syncope, speech difficulty, weakness and numbness.      Allergies  Review of patient's allergies indicates no known allergies.  Home Medications   Current Outpatient Rx  Name Route Sig Dispense Refill  . ALBUTEROL SULFATE HFA 108 (90 BASE) MCG/ACT IN AERS Inhalation Inhale 2 puffs into the lungs every 4 (four) hours as needed. As needed for shortness of breath.    . BECLOMETHASONE DIPROPIONATE 80 MCG/ACT IN AERS Inhalation Inhale 2-4 puffs into the lungs every 4 (four) hours as needed. As needed for asthma.    . CETIRIZINE HCL 10 MG PO TABS Oral Take 5 mg by mouth daily.    Marland Kitchen GLIPIZIDE ER 10 MG PO TB24 Oral Take 20 mg by mouth every morning.    . IBUPROFEN 200 MG PO TABS Oral Take 200 mg by mouth every 6 (six) hours as needed. Pain.    Marland Kitchen LISINOPRIL 10 MG PO TABS Oral Take 10 mg by mouth at bedtime.    Marland Kitchen METFORMIN HCL 1000 MG PO TABS Oral Take 1,000 mg by mouth 2 (two) times daily with a meal.    . HYDROCODONE-ACETAMINOPHEN 5-500 MG PO TABS Oral Take 1 tablet by mouth every 6 (six) hours as needed for pain. 15 tablet 0  . PENICILLIN V POTASSIUM 500 MG PO TABS Oral Take 1 tablet (500 mg total) by  mouth 4 (four) times daily. 40 tablet 0    BP 154/77  Pulse 115  Temp(Src) 99.5 F (37.5 C) (Oral)  Resp 20  SpO2 96%  Physical Exam Consitutional: Pt in no acute distress.   Head: Normocephalic and atraumatic.  Eyes: Extraocular motion intact, no scleral icterus Teeth: many previous extractions.  Poor dentitions.  Scattered caries.  Very tender to percusion R upper canine, with various areas of caries.  Moderate facial swelling from mandible to inferior peri-orbital.  Normal posterior pharyngeal structures otherwise, No local lymphadenopathy.  Neck: Supple without meningismus, mass, or overt JVD Respiratory:  Effort normal and breath sounds normal. No respiratory distress. CV: Heart regular rate and rhythm, no obvious murmurs.  Pulses +2 and symmetric Abdomen: Soft, non-tender, non-distended MSK: Extremities are atraumatic without deformity, ROM intact Skin: Warm, dry, intact Neuro: Alert and oriented, no motor deficit noted.  Psychiatric: Mood and affect are normal    ED Course  Procedures (including critical care time)  Labs Reviewed - No data to display No results found.   1. Tooth abscess   2. Right facial swelling       MDM  Patient was likely periapical abscess of the tooth described above. There is no obvious gingival involvement or abscess however there is swelling of the face as described above under physical exam. There's appears to be no airway complaints or involvement at this time and no involvement of the posterior pharynx and the tonsillar pillars or the structures of the inner ear and the middle ear. Patient is nontoxic and afebrile here. Patient to be discharged home on penicillin VK with close followup with dentistry on Monday. Vicodin here and RX vicodin.    The patient does report that she has a dentist and can followup. We'll also give low-dose opioid pain medications with strict use precautions. PT DC home stable.  Discussed with pt the clinical impression, treatment in the ED, and follow up plan.  We alslo discussed the indications for returning to the ED, which include shortness or breath, confusion, fever, new weakness or numbness, chest pain, or any other concerning symptom.  The pt understood the treatment and plan, is stable, and is able to leave the ED.    Nile Dear MD 06/13/2011   8:19 PM            Edwin Cap, MD 06/13/11 2020

## 2011-06-13 NOTE — ED Notes (Signed)
2 days ago started having tooth ache, and last night noticed- swelling to R cheek; pt moaning at triage; pt's daughter translating- pt speaks [place]

## 2011-06-13 NOTE — Discharge Instructions (Signed)
Take your antibiotics, and pain medication as directed as needed.  Monitor the pt after taking the pain medication  (Vicodin).  See the dentist on Monday.  You will not get better until the dentist sees you.  Follow up with your providers as dicussed in the ED today and as written above.  See your doctor immediately--or return to the ED--with any new or troubling symptoms including fevers, weakness, new chest pain, shortness or breath, numbness, or any other concerning symptom.   ?au R?ng (Dental Pain) B?n ? ???c Bc s? khm v b? ?au r?ng. ?au r?ng c th? do su r?ng gy ra, su r?ng lm dy th?n kinh c?a r?ng ti?p xc v?i khng kh v nhi?t ?? nng hay l?nh, do ? gy ?au. ?au r?ng c th? l do nhi?m trng ho?c p xe (cn ???c g?i l s?ng hay ung nh?t) xung quanh r?ng c?a b?n, c?ng th??ng do su r?ng gy ra. Nhi?m trng hay p xe quanh r?ng lm cho b?n b? ?au r?ng. CH?N ?ON Bc s? c th? ch?n ?on ?au r?ng th??ng b?ng cch khm r?ng. ?I?U TR?  N?u ?au r?ng do nhi?m trng, c th? dng thu?c khng sinh (thu?c gi?t vi trng) v thu?c gi?m ?au theo toa c?a Bc s? ?? ?i?u tr?. Hy u?ng thu?c theo h??ng d?n.   Ch? dng cc thu?c ???c bn khng c?n ??n thu?c c?a Bc s? ho?c theo toa c?a Bc s? ?? gi?m ?au, kh ch?u, hay s?t theo nh? h??ng d?n c?a Bc s?[place] d b?nh ?au r?ng ngy hm nay l do nhi?m trng hay do b?nh l v? r?ng, hy nn ??n Nha s? khm cng s?m cng t?t ?? ???c ch?m [place] thm.  ?I KHM B?NH N?U: Khm v ?i?u tr? ?au r?ng cho b?n hm nay ch? l x? tr c?p c?u. ?y khng ph?i l bi?n php thay th? ch?m [place] s?c kh?e ho?c ch?m [place] r?ng ??y ??. N?u b?nh c?a b?n ngy cng n?ng h?n hay cc tri?u ch?ng (v?n ??) m?i xu?t hi?n, v b?n khng th? thu x?p ?? ti khm ngay v?i Nha s? c?a b?n, hy g?i ?i?n tho?i ho?c tr? l?i phng khm ny. TM ??N CH?M [place] T? NGAY L?P T?C N?U:  B?n b? s?t.   ?? v ph n? vng m?t, hm, hay c?.   Khng th? m? mi?ng ???c.   ?au d? d?i khng gi?m v?i thu?c  gi?m ?au.  HY CH?C CH?N R?NG B?N:  Hi?u r nh?ng h??ng d?n khi xu?t vi?n.   S? theo di tnh tr?ng b?nh c?a b?n.   S? ??n khm b?nh ngay l?p t?c nh? ? ???c h??ng d?n.  Document Released: 12/23/2004 Document Revised: 12/12/2010 [facility] Patient Information 2012 [place].Abscessed Tooth A tooth abscess is a collection of infected fluid (pus) from a bacterial infection in the inner part of the tooth (pulp). It usually occurs at the end of the tooth's root.  CAUSES   A very bad cavity (extensive tooth decay).   Trauma to the tooth, such as a broken or chipped tooth, that allows bacteria to enter into the pulp.  SYMPTOMS  Severe pain in and around the infected tooth.   Swelling and redness around the abscessed tooth or in the mouth or face.   Tenderness.   Pus drainage.   Bad breath.   Bitter taste in the mouth.   Difficulty swallowing.   Difficulty opening the mouth.   Feeling sick to your stomach (  nauseous).   Vomiting.   Chills.   Swollen neck glands.  DIAGNOSIS  A medical and dental history will be taken.   An examination will be performed by tapping on the abscessed tooth.   X-rays may be taken of the tooth to identify the abscess.  TREATMENT The goal of treatment is to eliminate the infection.   You may be prescribed antibiotic medicine to stop the infection from spreading.   A root canal may be performed to save the tooth. If the tooth cannot be saved, it may be pulled (extracted) and the abscess may be drained.  HOME CARE INSTRUCTIONS  Only take over-the-counter or prescription medicines for pain, fever, or discomfort as directed by your caregiver.   Do not drive after taking pain medicine (narcotics).   Rinse your mouth (gargle) often with salt water ( tsp salt in 8 oz of warm water) to relieve pain or swelling.   Do not apply heat to the outside of your face.   Return to your dentist for further treatment as directed.  SEEK IMMEDIATE  DENTAL CARE IF:  You have a temperature by mouth above 102 F (38.9 C), not controlled by medicine.   You have chills or a very bad headache.   You have problems breathing or swallowing.   Your have trouble opening your mouth.   You develop swelling in the neck or around the eye.   Your pain is not helped by medicine.   Your pain is getting worse instead of better.  Document Released: 12/23/2004 Document Revised: 12/12/2010 Document Reviewed: 04/02/2010 [facility] Patient Information 2012 [place], [place].

## 2011-06-16 NOTE — ED Provider Notes (Signed)
I have personally seen and examined the patient and discussed plan of care with the resident.I have reviewed the appropriate documentation on PMH/FH/Soc. History.  I have reviewed the documentation of the resident and agree.  Pt no distress, no trismus noted, stable for d/c   Sharyon Cable, MD 06/16/11 1253

## 2011-09-20 ENCOUNTER — Emergency Department (HOSPITAL_COMMUNITY)
Admission: EM | Admit: 2011-09-20 | Discharge: 2011-09-20 | Disposition: A | Discharge status: Home / Self Care | Payer: Medicare Other | Attending: Emergency Medicine | Admitting: Emergency Medicine

## 2011-09-20 ENCOUNTER — Emergency Department (HOSPITAL_COMMUNITY): Payer: Medicare Other

## 2011-09-20 ENCOUNTER — Encounter (HOSPITAL_COMMUNITY): Payer: Self-pay | Admitting: Family Medicine

## 2011-09-20 DIAGNOSIS — I10 Essential (primary) hypertension: Secondary | ICD-10-CM | POA: Insufficient documentation

## 2011-09-20 DIAGNOSIS — E119 Type 2 diabetes mellitus without complications: Secondary | ICD-10-CM | POA: Insufficient documentation

## 2011-09-20 DIAGNOSIS — Z87891 Personal history of nicotine dependence: Secondary | ICD-10-CM | POA: Insufficient documentation

## 2011-09-20 DIAGNOSIS — J45901 Unspecified asthma with (acute) exacerbation: Secondary | ICD-10-CM | POA: Insufficient documentation

## 2011-09-20 LAB — CBC
HCT: 36.1 % (ref 36.0–46.0)
Hemoglobin: 12.3 g/dL (ref 12.0–15.0)
MCH: 29.5 pg (ref 26.0–34.0)
MCV: 86.6 fL (ref 78.0–100.0)
RBC: 4.17 MIL/uL (ref 3.87–5.11)
WBC: 9.7 10*3/uL (ref 4.0–10.5)

## 2011-09-20 LAB — BASIC METABOLIC PANEL
BUN: 20 mg/dL (ref 6–23)
CO2: 28 mEq/L (ref 19–32)
Calcium: 9.1 mg/dL (ref 8.4–10.5)
Chloride: 102 mEq/L (ref 96–112)
Creatinine, Ser: 1.1 mg/dL (ref 0.50–1.10)
Glucose, Bld: 231 mg/dL — ABNORMAL HIGH (ref 70–99)

## 2011-09-20 MED ORDER — PREDNISONE 20 MG PO TABS
60.0000 mg | ORAL_TABLET | Freq: Once | ORAL | Status: AC
  Administered 2011-09-20: 60 mg via ORAL
  Filled 2011-09-20: qty 3

## 2011-09-20 MED ORDER — IPRATROPIUM BROMIDE 0.02 % IN SOLN
0.5000 mg | Freq: Once | RESPIRATORY_TRACT | Status: AC
  Administered 2011-09-20: 0.5 mg via RESPIRATORY_TRACT
  Filled 2011-09-20: qty 2.5

## 2011-09-20 MED ORDER — ALBUTEROL SULFATE (5 MG/ML) 0.5% IN NEBU
5.0000 mg | INHALATION_SOLUTION | Freq: Once | RESPIRATORY_TRACT | Status: AC
  Administered 2011-09-20: 5 mg via RESPIRATORY_TRACT
  Filled 2011-09-20: qty 1

## 2011-09-20 MED ORDER — PREDNISONE 50 MG PO TABS: 50.0000 mg | ORAL_TABLET | Freq: Every day | ORAL | Status: DC

## 2011-09-20 NOTE — ED Notes (Signed)
Per pt family sts patient has had productive cough x 1 week and has been around someone that is sick.

## 2011-09-20 NOTE — ED Notes (Signed)
RT called for neb treatment

## 2011-09-20 NOTE — ED Provider Notes (Signed)
History  Scribed for Threasa Beards, MD, the patient was seen in room TR04C/TR04C. This chart was scribed by Truddie Coco. The patient's care started at 11:10 AM   CSN: [id number]  Arrival date & time 09/20/11  [id number]   First MD Initiated Contact with Patient 09/20/11 [phone]      Chief Complaint  Patient presents with  . Cough     The history is provided by a relative. A language interpreter was used (family member).   Nichole Munoz is a 66 y.o. female who presents to the Emergency Department complaining of an intermittent cough that started about six days ago.  Her relative reports that she had a fever of 100 six days ago with the onset of cough.  She has experienced vomiting associated with cough.  Pt has h/o diabetes, HTN, and asthma.  Pt has used her inhaler over the past week three to four times a day with little relief of cough.  Family member states that she has not recently checked blood sugar levels.  There are no other associated systemic symptoms, there are no other alleviating or modifying factors.   Past Medical History  Diagnosis Date  . Diabetes mellitus   . Hypertension     History reviewed. No pertinent past surgical history.  History reviewed. No pertinent family history.  History  Substance Use Topics  . Smoking status: Former Research scientist (life sciences)  . Smokeless tobacco: Not on file  . Alcohol Use: No    OB History    Grav Para Term Preterm Abortions TAB SAB Ect Mult Living                  Review of Systems  Constitutional: Positive for fever.  Respiratory: Positive for cough and wheezing.   Cardiovascular: Negative for leg swelling.  Gastrointestinal: Positive for vomiting.  All other systems reviewed and are negative.    Allergies  Review of patient's allergies indicates no known allergies.  Home Medications   Current Outpatient Rx  Name Route Sig Dispense Refill  . ALBUTEROL SULFATE HFA 108 (90 BASE) MCG/ACT IN AERS Inhalation Inhale 2 puffs into the lungs  every 4 (four) hours as needed. As needed for shortness of breath.    . AMLODIPINE BESYLATE 10 MG PO TABS Oral Take 10 mg by mouth daily.    . BECLOMETHASONE DIPROPIONATE 80 MCG/ACT IN AERS Inhalation Inhale 2-4 puffs into the lungs every 4 (four) hours as needed. As needed for asthma.    . CETIRIZINE HCL 10 MG PO TABS Oral Take 5 mg by mouth daily.    Marland Kitchen GABAPENTIN 300 MG PO CAPS Oral Take 300 mg by mouth 3 (three) times daily.    Marland Kitchen GLIPIZIDE ER 10 MG PO TB24 Oral Take 20 mg by mouth every morning.    . IBUPROFEN 200 MG PO TABS Oral Take 200 mg by mouth every 6 (six) hours as needed. Pain.    Marland Kitchen LISINOPRIL 10 MG PO TABS Oral Take 10 mg by mouth at bedtime.    Marland Kitchen METFORMIN HCL 1000 MG PO TABS Oral Take 1,000 mg by mouth 2 (two) times daily with a meal.    . PREDNISONE 50 MG PO TABS Oral Take 1 tablet (50 mg total) by mouth daily. 4 tablet 0    BP 151/107  Pulse 87  Temp 98.9 F (37.2 C) (Oral)  Resp 20  SpO2 97%  Physical Exam  Nursing note and vitals reviewed. Constitutional: She is oriented to person, place, and  time. She appears well-developed and well-nourished. No distress.  HENT:  Head: Normocephalic and atraumatic.  Mouth/Throat: Oropharynx is clear and moist.  Eyes: EOM are normal. Pupils are equal, round, and reactive to light.  Neck: Neck supple. No tracheal deviation present.  Cardiovascular: Normal rate.   Pulmonary/Chest: Effort normal. No respiratory distress.       Bilateral course expiratory wheezing.  Intermittent coughing upon exam.    Musculoskeletal: Normal range of motion. She exhibits no edema.  Neurological: She is alert and oriented to person, place, and time.  Skin: Skin is warm and dry.  Psychiatric: She has a normal mood and affect. Her behavior is normal.  Extremities- no cyanosis, clubbing, edema  ED Course  Procedures  DIAGNOSTIC STUDIES: Oxygen Saturation is 97% on room air, normal by my interpretation.    COORDINATION OF CARE:  8:28 Ordered: DG  Chest 2 View  11:16 Ordered: CBC; Basic metabolic panel   Labs Reviewed  BASIC METABOLIC PANEL - Abnormal; Notable for the following:    Glucose, Bld 231 (*)     GFR calc non Af Amer 51 (*)     GFR calc Af Amer 59 (*)     All other components within normal limits  CBC  LAB REPORT - SCANNED   Dg Chest 2 View  09/20/2011  *RADIOLOGY REPORT*  Clinical Data: Cough.  Short of breath.  CHEST - 2 VIEW  Comparison: 03/17/2011  Findings: Lungs are under aerated and grossly clear.  Upper normal heart size allowing for low lung volumes.  Right paratracheal density likely reflects compression of the mediastinum with low volumes and ectatic vasculature.  No pneumothorax.  No pleural effusion.  Epicardial fat pad at the apex is noted on the lateral view.  IMPRESSION: No active cardiopulmonary disease.   Original Report Authenticated By: Jamas Lav, M.D.      1. Asthma exacerbation       MDM  Pt presenting with c/o cough x 1 week- productive of sputum.  She has a hx of asthma.  CXR shows no acute abnormality (CXR images reviewed by me), she was treated in the ED with breathing treatment which did help her wheezing.  Also started on prednisone.  Discharged with strict return precautions.  Pt agreeable with plan.  I personally performed the services described in this documentation, which was scribed in my presence. The recorded information has been reviewed and considered.         Threasa Beards, MD 09/21/11 [phone]

## 2011-10-27 ENCOUNTER — Observation Stay (HOSPITAL_COMMUNITY)
Admission: EM | Admit: 2011-10-27 | Discharge: 2011-10-28 | Disposition: A | Discharge status: Home / Self Care | Payer: Medicare Other | Attending: Emergency Medicine | Admitting: Emergency Medicine

## 2011-10-27 ENCOUNTER — Encounter (HOSPITAL_COMMUNITY): Payer: Self-pay | Admitting: Emergency Medicine

## 2011-10-27 ENCOUNTER — Emergency Department (INDEPENDENT_AMBULATORY_CARE_PROVIDER_SITE_OTHER)
Admission: EM | Admit: 2011-10-27 | Discharge: 2011-10-27 | Disposition: A | Discharge status: Other Acute Inpatient Hospital | Payer: Medicare Other | Source: Home / Self Care | Attending: Emergency Medicine | Admitting: Emergency Medicine

## 2011-10-27 ENCOUNTER — Encounter (HOSPITAL_COMMUNITY): Payer: Self-pay

## 2011-10-27 DIAGNOSIS — R739 Hyperglycemia, unspecified: Secondary | ICD-10-CM

## 2011-10-27 DIAGNOSIS — I10 Essential (primary) hypertension: Secondary | ICD-10-CM | POA: Insufficient documentation

## 2011-10-27 DIAGNOSIS — R51 Headache: Secondary | ICD-10-CM | POA: Insufficient documentation

## 2011-10-27 DIAGNOSIS — E119 Type 2 diabetes mellitus without complications: Principal | ICD-10-CM | POA: Insufficient documentation

## 2011-10-27 DIAGNOSIS — N289 Disorder of kidney and ureter, unspecified: Secondary | ICD-10-CM

## 2011-10-27 HISTORY — DX: Unspecified asthma, uncomplicated: J45.909

## 2011-10-27 LAB — CBC WITH DIFFERENTIAL/PLATELET
Eosinophils Absolute: 0.4 10*3/uL (ref 0.0–0.7)
Eosinophils Relative: 4 % (ref 0–5)
Hemoglobin: 13.6 g/dL (ref 12.0–15.0)
Lymphs Abs: 3.9 10*3/uL (ref 0.7–4.0)
MCH: 30.1 pg (ref 26.0–34.0)
MCHC: 36.3 g/dL — ABNORMAL HIGH (ref 30.0–36.0)
MCV: 83 fL (ref 78.0–100.0)
Monocytes Relative: 5 % (ref 3–12)
Platelets: 149 10*3/uL — ABNORMAL LOW (ref 150–400)
RBC: 4.52 MIL/uL (ref 3.87–5.11)

## 2011-10-27 LAB — COMPREHENSIVE METABOLIC PANEL
BUN: 26 mg/dL — ABNORMAL HIGH (ref 6–23)
Calcium: 10 mg/dL (ref 8.4–10.5)
GFR calc Af Amer: 62 mL/min — ABNORMAL LOW (ref >90)
Glucose, Bld: 337 mg/dL — ABNORMAL HIGH (ref 70–99)
Total Protein: 8.3 g/dL (ref 6.0–8.3)

## 2011-10-27 LAB — URINALYSIS, ROUTINE W REFLEX MICROSCOPIC
Leukocytes, UA: NEGATIVE
Nitrite: NEGATIVE
Specific Gravity, Urine: 1.028 (ref 1.005–1.030)
pH: 6.5 (ref 5.0–8.0)

## 2011-10-27 LAB — POCT I-STAT, CHEM 8
BUN: 28 mg/dL — ABNORMAL HIGH (ref 6–23)
Calcium, Ion: 1.24 mmol/L (ref 1.13–1.30)
Creatinine, Ser: 1.4 mg/dL — ABNORMAL HIGH (ref 0.50–1.10)
Glucose, Bld: 513 mg/dL — ABNORMAL HIGH (ref 70–99)
Sodium: 133 mEq/L — ABNORMAL LOW (ref 135–145)
TCO2: 28 mmol/L (ref 0–100)

## 2011-10-27 LAB — GLUCOSE, CAPILLARY
Glucose-Capillary: 160 mg/dL — ABNORMAL HIGH (ref 70–99)
Glucose-Capillary: 147 mg/dL — ABNORMAL HIGH (ref 70–99)

## 2011-10-27 LAB — POCT URINALYSIS DIP (DEVICE)
Bilirubin Urine: NEGATIVE
Hgb urine dipstick: NEGATIVE
Nitrite: NEGATIVE
Protein, ur: NEGATIVE mg/dL
pH: 7 (ref 5.0–8.0)

## 2011-10-27 MED ORDER — IBUPROFEN 400 MG PO TABS
400.0000 mg | ORAL_TABLET | Freq: Once | ORAL | Status: AC
  Administered 2011-10-27: 400 mg via ORAL
  Filled 2011-10-27: qty 1

## 2011-10-27 MED ORDER — LISINOPRIL 10 MG PO TABS: 10.0000 mg | ORAL_TABLET | Freq: Every day | ORAL | Status: DC

## 2011-10-27 MED ORDER — SODIUM CHLORIDE 0.9 % IV BOLUS (SEPSIS)
1000.0000 mL | Freq: Once | INTRAVENOUS | Status: AC
  Administered 2011-10-27: 1000 mL via INTRAVENOUS

## 2011-10-27 MED ORDER — ALBUTEROL SULFATE HFA 108 (90 BASE) MCG/ACT IN AERS
2.0000 | INHALATION_SPRAY | RESPIRATORY_TRACT | Status: DC | PRN
  Filled 2011-10-27: qty 6.7

## 2011-10-27 MED ORDER — SODIUM CHLORIDE 0.9 % IV SOLN
1000.0000 mL | Freq: Once | INTRAVENOUS | Status: AC
  Administered 2011-10-27: 1000 mL via INTRAVENOUS

## 2011-10-27 MED ORDER — SODIUM CHLORIDE 0.9 % IV SOLN: 1000.0000 mL | INTRAVENOUS | Status: DC

## 2011-10-27 MED ORDER — CETIRIZINE HCL 10 MG PO TABS: 5.0000 mg | ORAL_TABLET | Freq: Every day | ORAL | Status: DC

## 2011-10-27 MED ORDER — GLIPIZIDE ER 10 MG PO TB24: 20.0000 mg | ORAL_TABLET | Freq: Every morning | ORAL | Status: DC

## 2011-10-27 MED ORDER — BECLOMETHASONE DIPROPIONATE 80 MCG/ACT IN AERS: 2.0000 | INHALATION_SPRAY | RESPIRATORY_TRACT | Status: DC | PRN

## 2011-10-27 MED ORDER — INSULIN ASPART 100 UNIT/ML ~~LOC~~ SOLN
SUBCUTANEOUS | Status: AC
  Filled 2011-10-27: qty 1

## 2011-10-27 MED ORDER — GABAPENTIN 300 MG PO CAPS: 300.0000 mg | ORAL_CAPSULE | Freq: Three times a day (TID) | ORAL | Status: DC

## 2011-10-27 MED ORDER — INSULIN ASPART 100 UNIT/ML ~~LOC~~ SOLN
10.0000 [IU] | Freq: Once | SUBCUTANEOUS | Status: AC
  Administered 2011-10-27: 10 [IU] via SUBCUTANEOUS

## 2011-10-27 MED ORDER — AMLODIPINE BESYLATE 10 MG PO TABS: 10.0000 mg | ORAL_TABLET | Freq: Every day | ORAL | Status: DC

## 2011-10-27 MED ORDER — METFORMIN HCL 1000 MG PO TABS: 1000.0000 mg | ORAL_TABLET | Freq: Two times a day (BID) | ORAL | Status: DC

## 2011-10-27 NOTE — ED Provider Notes (Signed)
A work were A to look at the History     CSN: [id number]  Arrival date & time 10/27/11  1341   First MD Initiated Contact with Patient 10/27/11 1419      No chief complaint on file.   (Consider location/radiation/quality/duration/timing/severity/associated sxs/prior treatment) HPI Comments: Patient is brought in by a relative describes she's been off her medicines for several days as she was unable to obtain any refills as her clinic closed ([facility])l her prescriptions" " and she's been feeling very dizzy and tired for the last few days.". No chest pain, nausea vomiting or abdominal pain.  is a  The history is provided by the patient.    Past Medical History  Diagnosis Date  . Diabetes mellitus   . Hypertension     No past surgical history on file.  No family history on file.  History  Substance Use Topics  . Smoking status: Former Research scientist (life sciences)  . Smokeless tobacco: Not on file  . Alcohol Use: No    OB History    Grav Para Term Preterm Abortions TAB SAB Ect Mult Living                  Review of Systems  Constitutional: Positive for diaphoresis, appetite change and fatigue. Negative for fever and unexpected weight change.  Respiratory: Negative for cough and shortness of breath.   Cardiovascular: Negative for chest pain.  Gastrointestinal: Negative for nausea, abdominal pain and diarrhea.  Skin: Negative for color change, pallor, rash and wound.  Neurological: Positive for dizziness. Negative for weakness and headaches.    Allergies  Review of patient's allergies indicates no known allergies.  Home Medications   Current Outpatient Rx  Name Route Sig Dispense Refill  . ALBUTEROL SULFATE HFA 108 (90 BASE) MCG/ACT IN AERS Inhalation Inhale 2 puffs into the lungs every 4 (four) hours as needed. As needed for shortness of breath.    . AMLODIPINE BESYLATE 10 MG PO TABS Oral Take 10 mg by mouth daily.    . BECLOMETHASONE DIPROPIONATE 80 MCG/ACT IN AERS Inhalation  Inhale 2-4 puffs into the lungs every 4 (four) hours as needed. As needed for asthma.    . CETIRIZINE HCL 10 MG PO TABS Oral Take 5 mg by mouth daily.    Marland Kitchen GABAPENTIN 300 MG PO CAPS Oral Take 300 mg by mouth 3 (three) times daily.    Marland Kitchen GLIPIZIDE ER 10 MG PO TB24 Oral Take 20 mg by mouth every morning.    . IBUPROFEN 200 MG PO TABS Oral Take 200 mg by mouth every 6 (six) hours as needed. Pain.    Marland Kitchen LISINOPRIL 10 MG PO TABS Oral Take 10 mg by mouth at bedtime.    Marland Kitchen METFORMIN HCL 1000 MG PO TABS Oral Take 1,000 mg by mouth 2 (two) times daily with a meal.    . PREDNISONE 50 MG PO TABS Oral Take 1 tablet (50 mg total) by mouth daily. 4 tablet 0    BP 129/82  Pulse 89  Temp 98.7 F (37.1 C) (Oral)  Resp 18  SpO2 100%  Physical Exam  Nursing note and vitals reviewed. Constitutional: She appears well-developed and well-nourished.  Pulmonary/Chest: Effort normal.  Neurological: She is alert. No cranial nerve deficit.    ED Course  Procedures (including critical care time)  Labs Reviewed  POCT I-STAT, CHEM 8 - Abnormal; Notable for the following:    Sodium 133 (*)     Chloride 94 (*)  BUN 28 (*)     Creatinine, Ser 1.40 (*)     Glucose, Bld 513 (*)     All other components within normal limits  POCT URINALYSIS DIP (DEVICE) - Abnormal; Notable for the following:    Glucose, UA 500 (*)     All other components within normal limits   No results found.   1. Diabetes   2. Renal insufficiency       MDM  Diabetes exacerbation, new onset renal failure. Patient has been given 10 units of rapid insulin and transferred to the emergency department for further management and hyperglycemia control. Patient is hemodynamically stable but symptomatic.       Rosana Hoes, MD 10/27/11 1600

## 2011-10-27 NOTE — ED Provider Notes (Signed)
I saw and evaluated the patient, reviewed the resident's note and I agree with the findings and plan.  Pt with hyperglycemia, poorly controlled since closing of [facility]. Not in DKA, will move to CDU for hyperglycemia protocol.   Charles B. Karle Starch, MD 10/27/11 [id number]

## 2011-10-27 NOTE — ED Notes (Signed)
Pt began feeling dizzy this morning; pt was seen Urgent Care, CBG there was 513, pt was given 10 units of rapid insulin then transferred to ED.  CBG 474 on arrival to ED, pt complaining of being dizzy and having a headache; denies any N/V.  Pt was a pt of [facility] and took last dose of medications this morning, needs refills on prescriptions.

## 2011-10-27 NOTE — ED Provider Notes (Signed)
History     CSN: [id number]  Arrival date & time 10/27/11  1631   First MD Initiated Contact with Patient 10/27/11 1755      Chief Complaint  Patient presents with  . Hyperglycemia    (Consider location/radiation/quality/duration/timing/severity/associated sxs/prior treatment) HPI Comments: Pt is DM and ran out of her medicine yesterday. She has not seen a doctor in about three months. She was trying to find a PCP today to refill her meds when she developed her daily headache. She checked into urgent care where her CBG was noted to be elevated so she was sent here for further eval.   Patient is a 66 y.o. female presenting with headaches. The history is provided by the patient and a relative. The history is limited by a language barrier. A language interpreter was used (the son).  Headache  This is a recurrent problem. The current episode started 3 to 5 hours ago. The problem occurs constantly. The problem has not changed since onset.Associated with: having run out of medicine. The pain is located in the bilateral region. The pain is moderate. The pain radiates to the upper back. Pertinent negatives include no anorexia, no fever, no malaise/fatigue, no chest pressure, no near-syncope, no palpitations, no shortness of breath, no nausea and no vomiting. She has tried aspirin for the symptoms. The treatment provided no relief.    Past Medical History  Diagnosis Date  . Diabetes mellitus   . Hypertension   . Asthma     History reviewed. No pertinent past surgical history.  No family history on file.  History  Substance Use Topics  . Smoking status: Former Research scientist (life sciences)  . Smokeless tobacco: Not on file  . Alcohol Use: No    OB History    Grav Para Term Preterm Abortions TAB SAB Ect Mult Living                  Review of Systems  Constitutional: Negative for fever and malaise/fatigue.  Respiratory: Negative for cough and shortness of breath.   Cardiovascular: Negative for chest  pain, palpitations and near-syncope.  Gastrointestinal: Negative for nausea, vomiting, abdominal pain, diarrhea and anorexia.  Neurological: Positive for headaches.  All other systems reviewed and are negative.    Allergies  Review of patient's allergies indicates no known allergies.  Home Medications   Current Outpatient Rx  Name Route Sig Dispense Refill  . ALBUTEROL SULFATE HFA 108 (90 BASE) MCG/ACT IN AERS Inhalation Inhale 2 puffs into the lungs every 4 (four) hours as needed. As needed for shortness of breath.    . AMLODIPINE BESYLATE 10 MG PO TABS Oral Take 10 mg by mouth daily.    . BECLOMETHASONE DIPROPIONATE 80 MCG/ACT IN AERS Inhalation Inhale 2-4 puffs into the lungs every 4 (four) hours as needed. As needed for asthma.    . CETIRIZINE HCL 10 MG PO TABS Oral Take 5 mg by mouth daily.    Marland Kitchen GABAPENTIN 300 MG PO CAPS Oral Take 300 mg by mouth 3 (three) times daily.    Marland Kitchen GLIPIZIDE ER 10 MG PO TB24 Oral Take 20 mg by mouth every morning.    . IBUPROFEN 200 MG PO TABS Oral Take 200 mg by mouth every 6 (six) hours as needed. Pain.    Marland Kitchen LISINOPRIL 10 MG PO TABS Oral Take 10 mg by mouth at bedtime.    Marland Kitchen METFORMIN HCL 1000 MG PO TABS Oral Take 1,000 mg by mouth 2 (two) times daily with a  meal.    . PREDNISONE 50 MG PO TABS Oral Take 1 tablet (50 mg total) by mouth daily. 4 tablet 0    BP 107/64  Pulse 85  Temp 98.4 F (36.9 C)  Resp 19  SpO2 95%  Physical Exam  Nursing note and vitals reviewed. Constitutional: She is oriented to person, place, and time. She appears well-developed and well-nourished. No distress.  HENT:  Head: Normocephalic and atraumatic.  Eyes: EOM are normal. Pupils are equal, round, and reactive to light.  Neck: Normal range of motion.  Cardiovascular: Normal rate and normal heart sounds.   Pulmonary/Chest: Effort normal and breath sounds normal. No respiratory distress.  Abdominal: Soft. She exhibits no distension. There is no tenderness.    Musculoskeletal: Normal range of motion.  Neurological: She is alert and oriented to person, place, and time. No cranial nerve deficit or sensory deficit. She exhibits normal muscle tone. Coordination normal. GCS eye subscore is 4. GCS verbal subscore is 5. GCS motor subscore is 6.  Skin: Skin is warm and dry.    ED Course  Procedures (including critical care time)  Labs Reviewed  GLUCOSE, CAPILLARY - Abnormal; Notable for the following:    Glucose-Capillary 474 (*)     All other components within normal limits  CBC WITH DIFFERENTIAL - Abnormal; Notable for the following:    MCHC 36.3 (*)     Platelets 149 (*)     All other components within normal limits  COMPREHENSIVE METABOLIC PANEL - Abnormal; Notable for the following:    Sodium 134 (*)     Chloride 94 (*)     Glucose, Bld 337 (*)     BUN 26 (*)     GFR calc non Af Amer 54 (*)     GFR calc Af Amer 62 (*)     All other components within normal limits  URINALYSIS, ROUTINE W REFLEX MICROSCOPIC - Abnormal; Notable for the following:    Glucose, UA >1000 (*)     All other components within normal limits  GLUCOSE, CAPILLARY - Abnormal; Notable for the following:    Glucose-Capillary 228 (*)     All other components within normal limits  GLUCOSE, CAPILLARY - Abnormal; Notable for the following:    Glucose-Capillary 160 (*)     All other components within normal limits  GLUCOSE, CAPILLARY - Abnormal; Notable for the following:    Glucose-Capillary 147 (*)     All other components within normal limits  URINE MICROSCOPIC-ADD ON   No results found.   1. Hyperglycemia       MDM  5:55 PM Pt seen and examined. Will look for DKA and urinary infection as patient has been having increased frequency. Doubt serious cause to headache as it has been going on for about a month. Patient appears to have been taking motrin for this per med list, so will give a dose. Will also hydrate which may help headache as patient may be  dehydrated.  7:19 PM Pt does not apppear acidotic. Will place in CDU for hyperglycemia. Pt will need Rx refills for all of her medications.      Tania Ade, MD 10/27/11 [phone]

## 2011-10-27 NOTE — ED Notes (Signed)
Not feeling well, feet swollen; DM

## 2011-10-27 NOTE — ED Provider Notes (Signed)
The patient is moved to CDU under hyperglycemia protocol. She has no complaints and rests comfortably. Blood sugar responds to hydration. She is eating and drinking and continues to feel well.   Constitutional:  Well appearing Cardiac:  RRR Lungs: Clear Neuro: A&O x 3, no weakness.  Will discharge home with Rx's for all medications.   Leotis Shames, PA-C 10/27/11 2342

## 2011-10-27 NOTE — ED Notes (Addendum)
Patient from [facility] via shuttle for decreased Na, elevated BUN & Cr and CBG 513. Patient was given 10 units of regular insulin and sent to [facility] for further evaluation/treatment. Report received from [place], [place].

## 2011-10-28 NOTE — ED Provider Notes (Signed)
Medical screening examination/treatment/procedure(s) were conducted as a shared visit with non-physician practitioner(s) and myself.  I personally evaluated the patient during the encounter   Di Jasmer B. Karle Starch, MD 10/28/11 [phone]

## 2012-01-22 ENCOUNTER — Emergency Department (HOSPITAL_COMMUNITY)
Admission: EM | Admit: 2012-01-22 | Discharge: 2012-01-22 | Disposition: A | Discharge status: Home / Self Care | Payer: Medicare Other | Attending: Emergency Medicine | Admitting: Emergency Medicine

## 2012-01-22 ENCOUNTER — Encounter (HOSPITAL_COMMUNITY): Payer: Self-pay | Admitting: Emergency Medicine

## 2012-01-22 DIAGNOSIS — Z87891 Personal history of nicotine dependence: Secondary | ICD-10-CM | POA: Insufficient documentation

## 2012-01-22 DIAGNOSIS — E1169 Type 2 diabetes mellitus with other specified complication: Secondary | ICD-10-CM | POA: Insufficient documentation

## 2012-01-22 DIAGNOSIS — I10 Essential (primary) hypertension: Secondary | ICD-10-CM | POA: Insufficient documentation

## 2012-01-22 DIAGNOSIS — Z79899 Other long term (current) drug therapy: Secondary | ICD-10-CM | POA: Insufficient documentation

## 2012-01-22 DIAGNOSIS — E1165 Type 2 diabetes mellitus with hyperglycemia: Secondary | ICD-10-CM

## 2012-01-22 DIAGNOSIS — J45909 Unspecified asthma, uncomplicated: Secondary | ICD-10-CM | POA: Insufficient documentation

## 2012-01-22 LAB — URINE MICROSCOPIC-ADD ON

## 2012-01-22 LAB — COMPREHENSIVE METABOLIC PANEL
ALT: 25 U/L (ref 0–35)
AST: 25 U/L (ref 0–37)
Albumin: 4 g/dL (ref 3.5–5.2)
Alkaline Phosphatase: 85 U/L (ref 39–117)
BUN: 37 mg/dL — ABNORMAL HIGH (ref 6–23)
CO2: 26 mEq/L (ref 19–32)
Calcium: 9.8 mg/dL (ref 8.4–10.5)
Chloride: 97 mEq/L (ref 96–112)
Creatinine, Ser: 1.18 mg/dL — ABNORMAL HIGH (ref 0.50–1.10)
GFR calc Af Amer: 54 mL/min — ABNORMAL LOW (ref >90)
GFR calc non Af Amer: 47 mL/min — ABNORMAL LOW (ref >90)
Glucose, Bld: 301 mg/dL — ABNORMAL HIGH (ref 70–99)
Potassium: 4 mEq/L (ref 3.5–5.1)
Sodium: 136 mEq/L (ref 135–145)
Total Bilirubin: 0.4 mg/dL (ref 0.3–1.2)
Total Protein: 8.5 g/dL — ABNORMAL HIGH (ref 6.0–8.3)

## 2012-01-22 LAB — URINALYSIS, ROUTINE W REFLEX MICROSCOPIC
Bilirubin Urine: NEGATIVE
Glucose, UA: >1000 mg/dL — AB
Hgb urine dipstick: NEGATIVE
Ketones, ur: NEGATIVE mg/dL
Leukocytes, UA: NEGATIVE
Nitrite: NEGATIVE
Protein, ur: 30 mg/dL — AB
Specific Gravity, Urine: 1.024 (ref 1.005–1.030)
Urobilinogen, UA: 1 mg/dL (ref 0.0–1.0)
pH: 6.5 (ref 5.0–8.0)

## 2012-01-22 LAB — GLUCOSE, CAPILLARY: Glucose-Capillary: 234 mg/dL — ABNORMAL HIGH (ref 70–99)

## 2012-01-22 LAB — CBC WITH DIFFERENTIAL/PLATELET
Basophils Absolute: 0.1 10*3/uL (ref 0.0–0.1)
Basophils Relative: 1 % (ref 0–1)
Eosinophils Absolute: 0.6 10*3/uL (ref 0.0–0.7)
Eosinophils Relative: 7 % — ABNORMAL HIGH (ref 0–5)
HCT: 37.2 % (ref 36.0–46.0)
Hemoglobin: 13.5 g/dL (ref 12.0–15.0)
Lymphocytes Relative: 42 % (ref 12–46)
Lymphs Abs: 3.7 10*3/uL (ref 0.7–4.0)
MCH: 30.3 pg (ref 26.0–34.0)
MCHC: 36.3 g/dL — ABNORMAL HIGH (ref 30.0–36.0)
MCV: 83.6 fL (ref 78.0–100.0)
Monocytes Absolute: 0.5 10*3/uL (ref 0.1–1.0)
Monocytes Relative: 5 % (ref 3–12)
Neutro Abs: 4 10*3/uL (ref 1.7–7.7)
Neutrophils Relative %: 45 % (ref 43–77)
Platelets: 234 10*3/uL (ref 150–400)
RBC: 4.45 MIL/uL (ref 3.87–5.11)
RDW: 11.9 % (ref 11.5–15.5)
WBC: 8.9 10*3/uL (ref 4.0–10.5)

## 2012-01-22 MED ORDER — METFORMIN HCL 1000 MG PO TABS: 1000.0000 mg | ORAL_TABLET | Freq: Two times a day (BID) | ORAL | Status: DC

## 2012-01-22 MED ORDER — ONDANSETRON HCL 4 MG/2ML IJ SOLN
4.0000 mg | Freq: Once | INTRAMUSCULAR | Status: AC
  Administered 2012-01-22: 4 mg via INTRAVENOUS
  Filled 2012-01-22: qty 2

## 2012-01-22 MED ORDER — ALBUTEROL SULFATE HFA 108 (90 BASE) MCG/ACT IN AERS: 2.0000 | INHALATION_SPRAY | RESPIRATORY_TRACT | Status: DC | PRN

## 2012-01-22 MED ORDER — GABAPENTIN 300 MG PO CAPS: 300.0000 mg | ORAL_CAPSULE | Freq: Three times a day (TID) | ORAL | Status: DC

## 2012-01-22 MED ORDER — SODIUM CHLORIDE 0.9 % IV BOLUS (SEPSIS)
1000.0000 mL | Freq: Once | INTRAVENOUS | Status: AC
  Administered 2012-01-22: 1000 mL via INTRAVENOUS

## 2012-01-22 MED ORDER — AMLODIPINE BESYLATE 10 MG PO TABS: 10.0000 mg | ORAL_TABLET | Freq: Every day | ORAL | Status: DC

## 2012-01-22 MED ORDER — GLIPIZIDE ER 10 MG PO TB24: 20.0000 mg | ORAL_TABLET | Freq: Every morning | ORAL | Status: DC

## 2012-01-22 MED ORDER — LISINOPRIL 10 MG PO TABS: 10.0000 mg | ORAL_TABLET | Freq: Every day | ORAL | Status: DC

## 2012-01-22 MED ORDER — BECLOMETHASONE DIPROPIONATE 80 MCG/ACT IN AERS: 2.0000 | INHALATION_SPRAY | RESPIRATORY_TRACT | Status: DC | PRN

## 2012-01-22 NOTE — ED Notes (Signed)
Has been thirsty and  Voiding a lot x 1 week

## 2012-01-22 NOTE — ED Notes (Signed)
Out of sugar meds x 1 month

## 2012-01-22 NOTE — ED Notes (Signed)
Was brought to er for sugar being high states her son  Pt does not speak english

## 2012-01-28 NOTE — ED Provider Notes (Signed)
History    67 year old female with hyperglycemia. Patient also endorses polyuria and polydipsia for the past week. No abdominal pain. No nausea or vomiting. No fevers or chills. Patient has been out of her diabetic medications for approximately one month.  No confusion. Currently no outpt follow-up. History obtained from son translating.   CSN: [id number]  Arrival date & time 01/22/12  1437   First MD Initiated Contact with Patient 01/22/12 1607      Chief Complaint  Patient presents with  . Hyperglycemia    (Consider location/radiation/quality/duration/timing/severity/associated sxs/prior treatment) HPI  Past Medical History  Diagnosis Date  . Diabetes mellitus   . Hypertension   . Asthma     History reviewed. No pertinent past surgical history.  History reviewed. No pertinent family history.  History  Substance Use Topics  . Smoking status: Former Research scientist (life sciences)  . Smokeless tobacco: Not on file  . Alcohol Use: No    OB History    Grav Para Term Preterm Abortions TAB SAB Ect Mult Living                  Review of Systems  All systems reviewed and negative, other than as noted in HPI.   Allergies  Review of patient's allergies indicates no known allergies.  Home Medications   Current Outpatient Rx  Name  Route  Sig  Dispense  Refill  . CETIRIZINE HCL 10 MG PO TABS   Oral   Take 0.5 tablets (5 mg total) by mouth daily.   30 tablet   1   . IBUPROFEN 200 MG PO TABS   Oral   Take 200 mg by mouth every 6 (six) hours as needed. Pain.         . ALBUTEROL SULFATE HFA 108 (90 BASE) MCG/ACT IN AERS   Inhalation   Inhale 2 puffs into the lungs every 4 (four) hours as needed. As needed for shortness of breath.   1 Inhaler   5   . AMLODIPINE BESYLATE 10 MG PO TABS   Oral   Take 1 tablet (10 mg total) by mouth daily.   30 tablet   5   . BECLOMETHASONE DIPROPIONATE 80 MCG/ACT IN AERS   Inhalation   Inhale 2-4 puffs into the lungs every 4 (four) hours as  needed. As needed for asthma.   1 Inhaler   5   . GABAPENTIN 300 MG PO CAPS   Oral   Take 1 capsule (300 mg total) by mouth 3 (three) times daily.   90 capsule   5   . GLIPIZIDE ER 10 MG PO TB24   Oral   Take 2 tablets (20 mg total) by mouth every morning.   60 tablet   5   . LISINOPRIL 10 MG PO TABS   Oral   Take 1 tablet (10 mg total) by mouth at bedtime.   30 tablet   5   . METFORMIN HCL 1000 MG PO TABS   Oral   Take 1 tablet (1,000 mg total) by mouth 2 (two) times daily with a meal.   60 tablet   5     BP 127/81  Pulse 85  Temp 98.3 F (36.8 C) (Oral)  Resp 18  SpO2 96%  Physical Exam  Nursing note and vitals reviewed. Constitutional: She appears well-developed and well-nourished. No distress.  HENT:  Head: Normocephalic and atraumatic.  Eyes: Conjunctivae normal are normal. Right eye exhibits no discharge. Left eye exhibits no discharge.  Neck: Neck supple.  Cardiovascular: Normal rate, regular rhythm and normal heart sounds.  Exam reveals no gallop and no friction rub.   No murmur heard. Pulmonary/Chest: Effort normal and breath sounds normal. No respiratory distress.  Abdominal: Soft. She exhibits no distension. There is no tenderness.  Musculoskeletal: She exhibits no edema and no tenderness.  Neurological: She is alert.  Skin: Skin is warm and dry.  Psychiatric: She has a normal mood and affect. Her behavior is normal. Thought content normal.    ED Course  Procedures (including critical care time)  Labs Reviewed  CBC WITH DIFFERENTIAL - Abnormal; Notable for the following:    MCHC 36.3 (*)     Eosinophils Relative 7 (*)     All other components within normal limits  COMPREHENSIVE METABOLIC PANEL - Abnormal; Notable for the following:    Glucose, Bld 301 (*)     BUN 37 (*)     Creatinine, Ser 1.18 (*)     Total Protein 8.5 (*)     GFR calc non Af Amer 47 (*)     GFR calc Af Amer 54 (*)     All other components within normal limits    GLUCOSE, CAPILLARY - Abnormal; Notable for the following:    Glucose-Capillary 373 (*)     All other components within normal limits  URINALYSIS, ROUTINE W REFLEX MICROSCOPIC - Abnormal; Notable for the following:    Glucose, UA >1000 (*)     Protein, ur 30 (*)     All other components within normal limits  URINE MICROSCOPIC-ADD ON - Abnormal; Notable for the following:    Squamous Epithelial / LPF FEW (*)     All other components within normal limits  GLUCOSE, CAPILLARY - Abnormal; Notable for the following:    Glucose-Capillary 234 (*)     All other components within normal limits  LAB REPORT - SCANNED   No results found.   1. Poorly controlled diabetes mellitus       MDM  67 year old female with hyperglycemia. No anion gap. Bicarbonate is normal. Improved with fluids. Hyperglycemia likely secondary to medication noncompliance. Patient has been out of her medications. Prescriptions were provided. She was provided with a resource list peripheral outpatient followup.        Virgel Manifold, MD 01/28/12 [phone]

## 2012-02-05 ENCOUNTER — Encounter (HOSPITAL_COMMUNITY): Payer: Self-pay

## 2012-02-05 ENCOUNTER — Emergency Department (HOSPITAL_COMMUNITY)
Admission: EM | Admit: 2012-02-05 | Discharge: 2012-02-05 | Disposition: A | Discharge status: Home / Self Care | Payer: Medicare Other | Attending: Emergency Medicine | Admitting: Emergency Medicine

## 2012-02-05 ENCOUNTER — Other Ambulatory Visit: Payer: Self-pay

## 2012-02-05 ENCOUNTER — Emergency Department (HOSPITAL_COMMUNITY): Payer: Medicare Other

## 2012-02-05 DIAGNOSIS — I10 Essential (primary) hypertension: Secondary | ICD-10-CM | POA: Insufficient documentation

## 2012-02-05 DIAGNOSIS — Z79899 Other long term (current) drug therapy: Secondary | ICD-10-CM | POA: Insufficient documentation

## 2012-02-05 DIAGNOSIS — R509 Fever, unspecified: Secondary | ICD-10-CM | POA: Insufficient documentation

## 2012-02-05 DIAGNOSIS — IMO0002 Reserved for concepts with insufficient information to code with codable children: Secondary | ICD-10-CM | POA: Insufficient documentation

## 2012-02-05 DIAGNOSIS — Z87891 Personal history of nicotine dependence: Secondary | ICD-10-CM | POA: Insufficient documentation

## 2012-02-05 DIAGNOSIS — J111 Influenza due to unidentified influenza virus with other respiratory manifestations: Secondary | ICD-10-CM | POA: Insufficient documentation

## 2012-02-05 DIAGNOSIS — IMO0001 Reserved for inherently not codable concepts without codable children: Secondary | ICD-10-CM | POA: Insufficient documentation

## 2012-02-05 DIAGNOSIS — R1110 Vomiting, unspecified: Secondary | ICD-10-CM | POA: Insufficient documentation

## 2012-02-05 DIAGNOSIS — J45909 Unspecified asthma, uncomplicated: Secondary | ICD-10-CM | POA: Insufficient documentation

## 2012-02-05 DIAGNOSIS — R000 Tachycardia, unspecified: Secondary | ICD-10-CM | POA: Insufficient documentation

## 2012-02-05 DIAGNOSIS — I129 Hypertensive chronic kidney disease with stage 1 through stage 4 chronic kidney disease, or unspecified chronic kidney disease: Secondary | ICD-10-CM | POA: Insufficient documentation

## 2012-02-05 DIAGNOSIS — R51 Headache: Secondary | ICD-10-CM | POA: Insufficient documentation

## 2012-02-05 LAB — POCT I-STAT, CHEM 8
BUN: 18 mg/dL (ref 6–23)
Calcium, Ion: 1.19 mmol/L (ref 1.13–1.30)
Creatinine, Ser: 1.4 mg/dL — ABNORMAL HIGH (ref 0.50–1.10)
TCO2: 29 mmol/L (ref 0–100)

## 2012-02-05 LAB — CBC WITH DIFFERENTIAL/PLATELET
Eosinophils Relative: 2 % (ref 0–5)
HCT: 38.3 % (ref 36.0–46.0)
Lymphocytes Relative: 22 % (ref 12–46)
Lymphs Abs: 2.2 10*3/uL (ref 0.7–4.0)
MCV: 85.7 fL (ref 78.0–100.0)
Monocytes Absolute: 1.1 10*3/uL — ABNORMAL HIGH (ref 0.1–1.0)
RBC: 4.47 MIL/uL (ref 3.87–5.11)
RDW: 12 % (ref 11.5–15.5)
WBC: 10 10*3/uL (ref 4.0–10.5)

## 2012-02-05 MED ORDER — ACETAMINOPHEN 500 MG PO TABS
1000.0000 mg | ORAL_TABLET | Freq: Once | ORAL | Status: AC
  Administered 2012-02-05: 1000 mg via ORAL
  Filled 2012-02-05: qty 2

## 2012-02-05 MED ORDER — SODIUM CHLORIDE 0.9 % IV BOLUS (SEPSIS)
1000.0000 mL | Freq: Once | INTRAVENOUS | Status: AC
  Administered 2012-02-05: 1000 mL via INTRAVENOUS

## 2012-02-05 NOTE — ED Notes (Signed)
Voiced understanding of instructions given. States that she has a headache and has Tylenol at home

## 2012-02-05 NOTE — ED Provider Notes (Signed)
History     CSN: [id number]  Arrival date & time 02/05/12  1100   First MD Initiated Contact with Patient 02/05/12 1312      Chief Complaint  Patient presents with  . Generalized Body Aches  . Headache  . Fever    (Consider location/radiation/quality/duration/timing/severity/associated sxs/prior treatment) HPI History is obtained from language line using [place] interpreter the patient speaks no [place]. Complains of frontal headache cough subjective fever one episode of posttussive vomiting onset today. Patient has recently been taking care of her son who has been diagnosed with influenza. Patient denies shortness of breath. Denies other complaint. No treatment prior to coming here. Nothing makes symptoms better or worse Past Medical History  Diagnosis Date  . Diabetes mellitus   . Hypertension   . Asthma     History reviewed. No pertinent past surgical history.  History reviewed. No pertinent family history.  History  Substance Use Topics  . Smoking status: Former Research scientist (life sciences)  . Smokeless tobacco: Not on file  . Alcohol Use: No    OB History    Grav Para Term Preterm Abortions TAB SAB Ect Mult Living                  Review of Systems  Constitutional: Positive for fever.  Respiratory: Positive for cough.   Cardiovascular: Negative.   Gastrointestinal: Negative.   Musculoskeletal: Positive for myalgias.  Skin: Negative.   Neurological: Positive for headaches.  Hematological: Negative.   Psychiatric/Behavioral: Negative.   All other systems reviewed and are negative.    Allergies  Review of patient's allergies indicates no known allergies.  Home Medications   Current Outpatient Rx  Name  Route  Sig  Dispense  Refill  . ALBUTEROL SULFATE HFA 108 (90 BASE) MCG/ACT IN AERS   Inhalation   Inhale 2 puffs into the lungs every 4 (four) hours as needed. As needed for shortness of breath.   1 Inhaler   5   . AMLODIPINE BESYLATE 10 MG PO TABS   Oral   Take 1  tablet (10 mg total) by mouth daily.   30 tablet   5   . BECLOMETHASONE DIPROPIONATE 80 MCG/ACT IN AERS   Inhalation   Inhale 2-4 puffs into the lungs every 4 (four) hours as needed. As needed for asthma.   1 Inhaler   5   . CETIRIZINE HCL 10 MG PO TABS   Oral   Take 0.5 tablets (5 mg total) by mouth daily.   30 tablet   1   . GABAPENTIN 300 MG PO CAPS   Oral   Take 1 capsule (300 mg total) by mouth 3 (three) times daily.   90 capsule   5   . GLIPIZIDE ER 10 MG PO TB24   Oral   Take 2 tablets (20 mg total) by mouth every morning.   60 tablet   5   . IBUPROFEN 200 MG PO TABS   Oral   Take 200 mg by mouth every 6 (six) hours as needed. Pain.         Marland Kitchen LISINOPRIL 10 MG PO TABS   Oral   Take 1 tablet (10 mg total) by mouth at bedtime.   30 tablet   5   . METFORMIN HCL 1000 MG PO TABS   Oral   Take 1 tablet (1,000 mg total) by mouth 2 (two) times daily with a meal.   60 tablet   5     BP  136/76  Pulse 114  Temp 100 F (37.8 C) (Oral)  Resp 24  SpO2 95%  Physical Exam  Nursing note and vitals reviewed. Constitutional: She appears well-developed and well-nourished.  HENT:  Head: Normocephalic and atraumatic.  Eyes: Conjunctivae normal are normal. Pupils are equal, round, and reactive to light.  Neck: Neck supple. No tracheal deviation present. No thyromegaly present.  Cardiovascular: Regular rhythm.   No murmur heard.      Mildly tachycardic  Pulmonary/Chest: Effort normal and breath sounds normal.  Abdominal: Soft. Bowel sounds are normal. She exhibits no distension. There is no tenderness.  Musculoskeletal: Normal range of motion. She exhibits no edema and no tenderness.  Neurological: She is alert. Coordination normal.  Skin: Skin is warm and dry. No rash noted.  Psychiatric: She has a normal mood and affect.    ED Course  Procedures (including critical care time)  Labs Reviewed - No data to display No results found.   No diagnosis  found.  Physical after treatment with intravenous fluids and Tylenol. Results for orders placed during the hospital encounter of 02/05/12  CBC WITH DIFFERENTIAL      Component Value Range   WBC 10.0  4.0 - 10.5 K/uL   RBC 4.47  3.87 - 5.11 MIL/uL   Hemoglobin 13.3  12.0 - 15.0 g/dL   HCT 38.3  36.0 - 46.0 %   MCV 85.7  78.0 - 100.0 fL   MCH 29.8  26.0 - 34.0 pg   MCHC 34.7  30.0 - 36.0 g/dL   RDW 12.0  11.5 - 15.5 %   Platelets 209  150 - 400 K/uL   Neutrophils Relative 65  43 - 77 %   Neutro Abs 6.5  1.7 - 7.7 K/uL   Lymphocytes Relative 22  12 - 46 %   Lymphs Abs 2.2  0.7 - 4.0 K/uL   Monocytes Relative 11  3 - 12 %   Monocytes Absolute 1.1 (*) 0.1 - 1.0 K/uL   Eosinophils Relative 2  0 - 5 %   Eosinophils Absolute 0.2  0.0 - 0.7 K/uL   Basophils Relative 1  0 - 1 %   Basophils Absolute 0.1  0.0 - 0.1 K/uL  POCT I-STAT, CHEM 8      Component Value Range   Sodium 136  135 - 145 mEq/L   Potassium 4.2  3.5 - 5.1 mEq/L   Chloride 97  96 - 112 mEq/L   BUN 18  6 - 23 mg/dL   Creatinine, Ser 1.40 (*) 0.50 - 1.10 mg/dL   Glucose, Bld 286 (*) 70 - 99 mg/dL   Calcium, Ion 1.19  1.13 - 1.30 mmol/L   TCO2 29  0 - 100 mmol/L   Hemoglobin 13.9  12.0 - 15.0 g/dL   HCT 41.0  36.0 - 46.0 %   Dg Chest 2 View  02/05/2012  *RADIOLOGY REPORT*  Clinical Data: Cough and fever.  CHEST - 2 VIEW  Comparison:  09/20/2011  Findings:  The heart size and mediastinal contours are within normal limits.  Both lungs are clear.  The visualized skeletal structures are unremarkable.  IMPRESSION: No active cardiopulmonary disease.   Original Report Authenticated By: Earle Gell, M.D.     MDM  Plan followup with primary care Dr. if not improved in one week Patient likely with influenza, as she's been cared for her son is currently hospitalized with influenza. Plan encourage oral fluids, Tylenol, see PMD if not improved in one  Diagnosis #1 influenza #2 renal insufficiency chronic 3  hyperglycemia        Orlie Dakin, MD 02/05/12 1535

## 2012-02-05 NOTE — ED Notes (Signed)
Patient speaks very little [place]. Patient c/o headache, body aches, fever x 3 days. Patient has been around flu symptoms within her family.

## 2012-03-31 ENCOUNTER — Ambulatory Visit: Payer: Medicare Other | Admitting: *Deleted

## 2012-12-03 ENCOUNTER — Encounter (HOSPITAL_COMMUNITY): Payer: Self-pay | Admitting: Emergency Medicine

## 2012-12-03 ENCOUNTER — Emergency Department (HOSPITAL_COMMUNITY)
Admission: EM | Admit: 2012-12-03 | Discharge: 2012-12-03 | Disposition: A | Discharge status: Home / Self Care | Payer: Medicare Other | Attending: Emergency Medicine | Admitting: Emergency Medicine

## 2012-12-03 DIAGNOSIS — R42 Dizziness and giddiness: Secondary | ICD-10-CM | POA: Insufficient documentation

## 2012-12-03 DIAGNOSIS — Z79899 Other long term (current) drug therapy: Secondary | ICD-10-CM | POA: Insufficient documentation

## 2012-12-03 DIAGNOSIS — J45909 Unspecified asthma, uncomplicated: Secondary | ICD-10-CM | POA: Insufficient documentation

## 2012-12-03 DIAGNOSIS — E119 Type 2 diabetes mellitus without complications: Secondary | ICD-10-CM | POA: Insufficient documentation

## 2012-12-03 DIAGNOSIS — R739 Hyperglycemia, unspecified: Secondary | ICD-10-CM

## 2012-12-03 DIAGNOSIS — I10 Essential (primary) hypertension: Secondary | ICD-10-CM | POA: Insufficient documentation

## 2012-12-03 DIAGNOSIS — Z87891 Personal history of nicotine dependence: Secondary | ICD-10-CM | POA: Insufficient documentation

## 2012-12-03 DIAGNOSIS — R5381 Other malaise: Secondary | ICD-10-CM | POA: Insufficient documentation

## 2012-12-03 DIAGNOSIS — IMO0002 Reserved for concepts with insufficient information to code with codable children: Secondary | ICD-10-CM | POA: Insufficient documentation

## 2012-12-03 LAB — BASIC METABOLIC PANEL
GFR calc Af Amer: 63 mL/min — ABNORMAL LOW (ref >90)
GFR calc non Af Amer: 55 mL/min — ABNORMAL LOW (ref >90)
Glucose, Bld: 413 mg/dL — ABNORMAL HIGH (ref 70–99)
Potassium: 4.7 mEq/L (ref 3.5–5.1)
Sodium: 137 mEq/L (ref 135–145)

## 2012-12-03 LAB — CBC WITH DIFFERENTIAL/PLATELET
Basophils Absolute: 0.1 10*3/uL (ref 0.0–0.1)
Basophils Relative: 1 % (ref 0–1)
Eosinophils Relative: 9 % — ABNORMAL HIGH (ref 0–5)
HCT: 37.2 % (ref 36.0–46.0)
MCHC: 34.9 g/dL (ref 30.0–36.0)
MCV: 85.3 fL (ref 78.0–100.0)
Monocytes Absolute: 0.4 10*3/uL (ref 0.1–1.0)
RDW: 12 % (ref 11.5–15.5)

## 2012-12-03 LAB — URINALYSIS, ROUTINE W REFLEX MICROSCOPIC
Hgb urine dipstick: NEGATIVE
Leukocytes, UA: NEGATIVE
Nitrite: NEGATIVE
Specific Gravity, Urine: 1.025 (ref 1.005–1.030)
Urobilinogen, UA: 0.2 mg/dL (ref 0.0–1.0)

## 2012-12-03 LAB — URINE MICROSCOPIC-ADD ON

## 2012-12-03 LAB — COMPREHENSIVE METABOLIC PANEL
AST: 32 U/L (ref 0–37)
CO2: 29 mEq/L (ref 19–32)
Calcium: 9.6 mg/dL (ref 8.4–10.5)
Creatinine, Ser: 1.1 mg/dL (ref 0.50–1.10)
GFR calc non Af Amer: 50 mL/min — ABNORMAL LOW (ref >90)

## 2012-12-03 LAB — GLUCOSE, CAPILLARY: Glucose-Capillary: 349 mg/dL — ABNORMAL HIGH (ref 70–99)

## 2012-12-03 MED ORDER — INSULIN ASPART 100 UNIT/ML ~~LOC~~ SOLN
8.0000 [IU] | Freq: Once | SUBCUTANEOUS | Status: AC
  Administered 2012-12-03: 8 [IU] via SUBCUTANEOUS
  Filled 2012-12-03: qty 1

## 2012-12-03 MED ORDER — METFORMIN HCL 1000 MG PO TABS: 1000.0000 mg | ORAL_TABLET | Freq: Two times a day (BID) | ORAL | Status: DC

## 2012-12-03 MED ORDER — SODIUM CHLORIDE 0.9 % IV SOLN
INTRAVENOUS | Status: DC
  Administered 2012-12-03: 13:00:00 via INTRAVENOUS

## 2012-12-03 MED ORDER — SODIUM CHLORIDE 0.9 % IV BOLUS (SEPSIS)
1000.0000 mL | Freq: Once | INTRAVENOUS | Status: AC
  Administered 2012-12-03: 1000 mL via INTRAVENOUS

## 2012-12-03 MED ORDER — GLIPIZIDE ER 10 MG PO TB24: 20.0000 mg | ORAL_TABLET | Freq: Every morning | ORAL | Status: DC

## 2012-12-03 NOTE — ED Notes (Signed)
Per pt's son pt has been having generalized weakness and dizziness x 3 months. Pt doesn't speak english, son at bedside to translate. Per son pt has run out of diabetes medication and supplies about 3 months ago and has not been able to schedule an appointment with her PCP.

## 2012-12-03 NOTE — ED Provider Notes (Signed)
CSN: [id number]     Arrival date & time 12/03/12  1103 History   First MD Initiated Contact with Patient 12/03/12 1133     Chief Complaint  Patient presents with  . Weakness  . Dizziness  . Hyperglycemia   (Consider location/radiation/quality/duration/timing/severity/associated sxs/prior Treatment) Patient is a 67 y.o. female presenting with weakness and hyperglycemia. The history is provided by the patient.  Weakness  Hyperglycemia  patient here complaining of generalized weakness x3 months worse times one month after she stopped taking her diabetes medications. Patient notes polyuria and polydipsia. Denies any chest pain shortness of breath. Denies abdominal pain. No fever or chills. No cough or congestion. Has not seen her Dr. for this. Has not been following a diabetic diet. Symptoms are persistent.  Past Medical History  Diagnosis Date  . Diabetes mellitus   . Hypertension   . Asthma    History reviewed. No pertinent past surgical history. No family history on file. History  Substance Use Topics  . Smoking status: Former Research scientist (life sciences)  . Smokeless tobacco: Not on file  . Alcohol Use: No   OB History   Grav Para Term Preterm Abortions TAB SAB Ect Mult Living                 Review of Systems  Neurological: Positive for weakness.  All other systems reviewed and are negative.    Allergies  Other  Home Medications   Current Outpatient Rx  Name  Route  Sig  Dispense  Refill  . albuterol (PROVENTIL HFA;VENTOLIN HFA) 108 (90 BASE) MCG/ACT inhaler   Inhalation   Inhale 2 puffs into the lungs every 4 (four) hours as needed. As needed for shortness of breath.   1 Inhaler   5   . amLODipine (NORVASC) 10 MG tablet   Oral   Take 1 tablet (10 mg total) by mouth daily.   30 tablet   5   . beclomethasone (QVAR) 80 MCG/ACT inhaler   Inhalation   Inhale 2-4 puffs into the lungs every 4 (four) hours as needed. As needed for asthma.   1 Inhaler   5   . cetirizine (ZYRTEC)  10 MG tablet   Oral   Take 0.5 tablets (5 mg total) by mouth daily.   30 tablet   1   . gabapentin (NEURONTIN) 300 MG capsule   Oral   Take 1 capsule (300 mg total) by mouth 3 (three) times daily.   90 capsule   5   . glipiZIDE (GLUCOTROL XL) 10 MG 24 hr tablet   Oral   Take 2 tablets (20 mg total) by mouth every morning.   60 tablet   5   . ibuprofen (ADVIL,MOTRIN) 200 MG tablet   Oral   Take 200 mg by mouth every 6 (six) hours as needed. Pain.         Marland Kitchen lisinopril (PRINIVIL,ZESTRIL) 10 MG tablet   Oral   Take 1 tablet (10 mg total) by mouth at bedtime.   30 tablet   5   . metFORMIN (GLUCOPHAGE) 1000 MG tablet   Oral   Take 1 tablet (1,000 mg total) by mouth 2 (two) times daily with a meal.   60 tablet   5    BP 146/75  Pulse 97  Temp(Src) 98 F (36.7 C) (Oral)  Resp 16  SpO2 96% Physical Exam  Nursing note and vitals reviewed. Constitutional: She is oriented to person, place, and time. She appears well-developed and well-nourished.  Non-toxic appearance. No distress.  HENT:  Head: Normocephalic and atraumatic.  Eyes: Conjunctivae, EOM and lids are normal. Pupils are equal, round, and reactive to light.  Neck: Normal range of motion. Neck supple. No tracheal deviation present. No mass present.  Cardiovascular: Normal rate, regular rhythm and normal heart sounds.  Exam reveals no gallop.   No murmur heard. Pulmonary/Chest: Effort normal and breath sounds normal. No stridor. No respiratory distress. She has no decreased breath sounds. She has no wheezes. She has no rhonchi. She has no rales.  Abdominal: Soft. Normal appearance and bowel sounds are normal. She exhibits no distension. There is no tenderness. There is no rebound and no CVA tenderness.  Musculoskeletal: Normal range of motion. She exhibits no edema and no tenderness.  Neurological: She is alert and oriented to person, place, and time. She has normal strength. No cranial nerve deficit or sensory  deficit. GCS eye subscore is 4. GCS verbal subscore is 5. GCS motor subscore is 6.  Skin: Skin is warm and dry. No abrasion and no rash noted.  Psychiatric: She has a normal mood and affect. Her speech is normal and behavior is normal.    ED Course  Procedures (including critical care time) Labs Review Labs Reviewed  GLUCOSE, CAPILLARY - Abnormal; Notable for the following:    Glucose-Capillary 414 (*)    All other components within normal limits  CBC WITH DIFFERENTIAL  COMPREHENSIVE METABOLIC PANEL  URINALYSIS, ROUTINE W REFLEX MICROSCOPIC   Imaging Review No results found.  EKG Interpretation   None       MDM  No diagnosis found. Patient given IV fluids and insulin here. Repeat basic metabolic panel shows improved potassium as well as her hyperglycemia. Will prescribe patient and her diabetes meds and she will see her Dr.    Leota Jacobsen, MD 12/03/12 [phone]

## 2013-02-08 ENCOUNTER — Encounter (INDEPENDENT_AMBULATORY_CARE_PROVIDER_SITE_OTHER): Payer: Self-pay | Admitting: Ophthalmology

## 2013-04-12 ENCOUNTER — Encounter (HOSPITAL_COMMUNITY): Payer: Self-pay | Admitting: Emergency Medicine

## 2013-04-12 ENCOUNTER — Inpatient Hospital Stay (HOSPITAL_COMMUNITY)
Admission: EM | Admit: 2013-04-12 | Discharge: 2013-04-15 | DRG: 638 | Disposition: A | Discharge status: Home / Self Care | Payer: Medicare Other | Attending: Internal Medicine | Admitting: Internal Medicine

## 2013-04-12 DIAGNOSIS — N179 Acute kidney failure, unspecified: Secondary | ICD-10-CM

## 2013-04-12 DIAGNOSIS — R7989 Other specified abnormal findings of blood chemistry: Secondary | ICD-10-CM

## 2013-04-12 DIAGNOSIS — Z9119 Patient's noncompliance with other medical treatment and regimen: Secondary | ICD-10-CM

## 2013-04-12 DIAGNOSIS — I10 Essential (primary) hypertension: Secondary | ICD-10-CM | POA: Diagnosis present

## 2013-04-12 DIAGNOSIS — E1149 Type 2 diabetes mellitus with other diabetic neurological complication: Secondary | ICD-10-CM

## 2013-04-12 DIAGNOSIS — E861 Hypovolemia: Secondary | ICD-10-CM | POA: Diagnosis present

## 2013-04-12 DIAGNOSIS — J45909 Unspecified asthma, uncomplicated: Secondary | ICD-10-CM | POA: Diagnosis present

## 2013-04-12 DIAGNOSIS — E871 Hypo-osmolality and hyponatremia: Secondary | ICD-10-CM

## 2013-04-12 DIAGNOSIS — Z91199 Patient's noncompliance with other medical treatment and regimen due to unspecified reason: Secondary | ICD-10-CM

## 2013-04-12 DIAGNOSIS — N390 Urinary tract infection, site not specified: Secondary | ICD-10-CM | POA: Diagnosis present

## 2013-04-12 DIAGNOSIS — R945 Abnormal results of liver function studies: Secondary | ICD-10-CM

## 2013-04-12 DIAGNOSIS — Z87891 Personal history of nicotine dependence: Secondary | ICD-10-CM

## 2013-04-12 DIAGNOSIS — Z79899 Other long term (current) drug therapy: Secondary | ICD-10-CM

## 2013-04-12 DIAGNOSIS — E1100 Type 2 diabetes mellitus with hyperosmolarity without nonketotic hyperglycemic-hyperosmolar coma (NKHHC): Secondary | ICD-10-CM | POA: Insufficient documentation

## 2013-04-12 DIAGNOSIS — E1140 Type 2 diabetes mellitus with diabetic neuropathy, unspecified: Secondary | ICD-10-CM | POA: Diagnosis present

## 2013-04-12 DIAGNOSIS — R112 Nausea with vomiting, unspecified: Secondary | ICD-10-CM | POA: Diagnosis present

## 2013-04-12 DIAGNOSIS — E1310 Other specified diabetes mellitus with ketoacidosis without coma: Principal | ICD-10-CM

## 2013-04-12 DIAGNOSIS — E1110 Type 2 diabetes mellitus with ketoacidosis without coma: Secondary | ICD-10-CM | POA: Diagnosis present

## 2013-04-12 DIAGNOSIS — E1142 Type 2 diabetes mellitus with diabetic polyneuropathy: Secondary | ICD-10-CM

## 2013-04-12 DIAGNOSIS — R3989 Other symptoms and signs involving the genitourinary system: Secondary | ICD-10-CM | POA: Diagnosis present

## 2013-04-12 HISTORY — DX: Type 2 diabetes mellitus with diabetic neuropathy, unspecified: E11.40

## 2013-04-12 HISTORY — DX: Other seasonal allergic rhinitis: J30.2

## 2013-04-12 HISTORY — DX: Obesity, unspecified: E66.9

## 2013-04-12 LAB — COMPREHENSIVE METABOLIC PANEL WITH GFR
Albumin: 4.4 g/dL (ref 3.5–5.2)
Alkaline Phosphatase: 103 U/L (ref 39–117)
Potassium: 4.2 meq/L (ref 3.7–5.3)
Total Protein: 8.9 g/dL — ABNORMAL HIGH (ref 6.0–8.3)

## 2013-04-12 LAB — CBC WITH DIFFERENTIAL/PLATELET
Basophils Absolute: 0.1 K/uL (ref 0.0–0.1)
Basophils Relative: 1 % (ref 0–1)
Eosinophils Absolute: 0.4 10*3/uL (ref 0.0–0.7)
Eosinophils Relative: 2 % (ref 0–5)
HCT: 41.2 % (ref 36.0–46.0)
Hemoglobin: 15 g/dL (ref 12.0–15.0)
Lymphocytes Relative: 31 % (ref 12–46)
Lymphs Abs: 5.9 10*3/uL — ABNORMAL HIGH (ref 0.7–4.0)
MCH: 30.4 pg (ref 26.0–34.0)
MCHC: 36.4 g/dL — ABNORMAL HIGH (ref 30.0–36.0)
MCV: 83.4 fL (ref 78.0–100.0)
Monocytes Absolute: 1.3 10*3/uL — ABNORMAL HIGH (ref 0.1–1.0)
Monocytes Relative: 7 % (ref 3–12)
Neutro Abs: 11.3 10*3/uL — ABNORMAL HIGH (ref 1.7–7.7)
Neutrophils Relative %: 60 % (ref 43–77)
Platelets: 234 10*3/uL (ref 150–400)
RBC: 4.94 MIL/uL (ref 3.87–5.11)
RDW: 11.7 % (ref 11.5–15.5)
WBC: 18.9 10*3/uL — ABNORMAL HIGH (ref 4.0–10.5)

## 2013-04-12 LAB — CBG MONITORING, ED
Glucose-Capillary: 593 mg/dL (ref 70–99)
Glucose-Capillary: 464 mg/dL — ABNORMAL HIGH (ref 70–99)
Glucose-Capillary: 436 mg/dL — ABNORMAL HIGH (ref 70–99)

## 2013-04-12 LAB — URINALYSIS, ROUTINE W REFLEX MICROSCOPIC
Bilirubin Urine: NEGATIVE
Glucose, UA: >1000 mg/dL — AB
Ketones, ur: NEGATIVE mg/dL
Nitrite: POSITIVE — AB
Protein, ur: 30 mg/dL — AB
Specific Gravity, Urine: 1.024 (ref 1.005–1.030)
Urobilinogen, UA: 0.2 mg/dL (ref 0.0–1.0)
pH: 6.5 (ref 5.0–8.0)

## 2013-04-12 LAB — URINE MICROSCOPIC-ADD ON

## 2013-04-12 LAB — COMPREHENSIVE METABOLIC PANEL
ALT: 54 U/L — ABNORMAL HIGH (ref 0–35)
AST: 40 U/L — ABNORMAL HIGH (ref 0–37)
BUN: 40 mg/dL — ABNORMAL HIGH (ref 6–23)
CO2: 22 mEq/L (ref 19–32)
Calcium: 10.6 mg/dL — ABNORMAL HIGH (ref 8.4–10.5)
Chloride: 82 mEq/L — ABNORMAL LOW (ref 96–112)
Creatinine, Ser: 1.15 mg/dL — ABNORMAL HIGH (ref 0.50–1.10)
GFR calc Af Amer: 55 mL/min — ABNORMAL LOW (ref >90)
GFR calc non Af Amer: 48 mL/min — ABNORMAL LOW (ref >90)
Glucose, Bld: 692 mg/dL (ref 70–99)
Sodium: 124 mEq/L — ABNORMAL LOW (ref 137–147)
Total Bilirubin: 0.9 mg/dL (ref 0.3–1.2)

## 2013-04-12 MED ORDER — SALINE SPRAY 0.65 % NA SOLN
1.0000 | NASAL | Status: DC | PRN
  Filled 2013-04-12: qty 44

## 2013-04-12 MED ORDER — POTASSIUM CHLORIDE 10 MEQ/100ML IV SOLN: 10.0000 meq | INTRAVENOUS | Status: AC

## 2013-04-12 MED ORDER — SODIUM CHLORIDE 0.9 % IV SOLN
INTRAVENOUS | Status: DC
  Filled 2013-04-12: qty 1

## 2013-04-12 MED ORDER — SODIUM CHLORIDE 0.9 % IV SOLN
1000.0000 mL | INTRAVENOUS | Status: DC
  Administered 2013-04-12: 1000 mL via INTRAVENOUS

## 2013-04-12 MED ORDER — ACETAMINOPHEN 325 MG PO TABS: 650.0000 mg | ORAL_TABLET | Freq: Four times a day (QID) | ORAL | Status: DC | PRN

## 2013-04-12 MED ORDER — FAMOTIDINE IN NACL 20-0.9 MG/50ML-% IV SOLN
20.0000 mg | INTRAVENOUS | Status: DC
  Administered 2013-04-13: 20 mg via INTRAVENOUS
  Filled 2013-04-12 (×3): qty 50

## 2013-04-12 MED ORDER — DEXTROSE 5 % IV SOLN
1.0000 g | INTRAVENOUS | Status: DC
  Administered 2013-04-13 – 2013-04-14 (×2): 1 g via INTRAVENOUS
  Filled 2013-04-12 (×4): qty 10

## 2013-04-12 MED ORDER — HEPARIN SODIUM (PORCINE) 5000 UNIT/ML IJ SOLN
5000.0000 [IU] | Freq: Three times a day (TID) | INTRAMUSCULAR | Status: DC
  Administered 2013-04-13 – 2013-04-15 (×6): 5000 [IU] via SUBCUTANEOUS
  Filled 2013-04-12 (×11): qty 1

## 2013-04-12 MED ORDER — DEXTROSE-NACL 5-0.45 % IV SOLN
INTRAVENOUS | Status: DC
  Administered 2013-04-13: 03:00:00 via INTRAVENOUS

## 2013-04-12 MED ORDER — ONDANSETRON HCL 4 MG/2ML IJ SOLN
4.0000 mg | Freq: Once | INTRAMUSCULAR | Status: AC
  Administered 2013-04-12: 4 mg via INTRAVENOUS
  Filled 2013-04-12: qty 2

## 2013-04-12 MED ORDER — SODIUM CHLORIDE 0.9 % IV BOLUS (SEPSIS)
1000.0000 mL | Freq: Once | INTRAVENOUS | Status: AC
  Administered 2013-04-12: 1000 mL via INTRAVENOUS

## 2013-04-12 MED ORDER — SODIUM CHLORIDE 0.9 % IV SOLN
1000.0000 mL | Freq: Once | INTRAVENOUS | Status: AC
  Administered 2013-04-12 (×2): 1000 mL via INTRAVENOUS

## 2013-04-12 MED ORDER — SODIUM CHLORIDE 0.9 % IV SOLN
1000.0000 mL | Freq: Once | INTRAVENOUS | Status: AC
  Administered 2013-04-12: 1000 mL via INTRAVENOUS

## 2013-04-12 MED ORDER — HYDRALAZINE HCL 20 MG/ML IJ SOLN
5.0000 mg | Freq: Four times a day (QID) | INTRAMUSCULAR | Status: DC | PRN
  Filled 2013-04-12: qty 0.25

## 2013-04-12 MED ORDER — ALBUTEROL SULFATE HFA 108 (90 BASE) MCG/ACT IN AERS: 2.0000 | INHALATION_SPRAY | Freq: Four times a day (QID) | RESPIRATORY_TRACT | Status: DC | PRN

## 2013-04-12 MED ORDER — SODIUM CHLORIDE 0.9 % IV SOLN: INTRAVENOUS | Status: DC

## 2013-04-12 MED ORDER — BENZONATATE 100 MG PO CAPS: 100.0000 mg | ORAL_CAPSULE | Freq: Three times a day (TID) | ORAL | Status: DC | PRN

## 2013-04-12 MED ORDER — DEXTROSE-NACL 5-0.45 % IV SOLN: INTRAVENOUS | Status: DC

## 2013-04-12 MED ORDER — INSULIN REGULAR HUMAN 100 UNIT/ML IJ SOLN
INTRAMUSCULAR | Status: DC
  Administered 2013-04-12: 4 [IU]/h via INTRAVENOUS
  Filled 2013-04-12: qty 1

## 2013-04-12 MED ORDER — FLUTICASONE PROPIONATE HFA 44 MCG/ACT IN AERO
1.0000 | INHALATION_SPRAY | Freq: Two times a day (BID) | RESPIRATORY_TRACT | Status: DC
  Administered 2013-04-13 – 2013-04-15 (×5): 1 via RESPIRATORY_TRACT
  Filled 2013-04-12: qty 10.6

## 2013-04-12 MED ORDER — MORPHINE SULFATE 2 MG/ML IJ SOLN: 2.0000 mg | INTRAMUSCULAR | Status: DC | PRN

## 2013-04-12 MED ORDER — DEXTROSE 50 % IV SOLN: 25.0000 mL | INTRAVENOUS | Status: DC | PRN

## 2013-04-12 MED ORDER — DEXTROSE 5 % IV SOLN
1.0000 g | Freq: Once | INTRAVENOUS | Status: AC
  Administered 2013-04-12: 1 g via INTRAVENOUS
  Filled 2013-04-12: qty 10

## 2013-04-12 MED ORDER — ALBUTEROL SULFATE (2.5 MG/3ML) 0.083% IN NEBU: 2.5000 mg | INHALATION_SOLUTION | Freq: Four times a day (QID) | RESPIRATORY_TRACT | Status: DC | PRN

## 2013-04-12 MED ORDER — ONDANSETRON HCL 4 MG/2ML IJ SOLN: 4.0000 mg | Freq: Four times a day (QID) | INTRAMUSCULAR | Status: DC | PRN

## 2013-04-12 NOTE — ED Provider Notes (Addendum)
CSN: [id number]     Arrival date & time 04/12/13  2009 History   First MD Initiated Contact with Patient 04/12/13 2040     Chief Complaint  Patient presents with  . Emesis  . Fatigue  . Hyperglycemia     (Consider location/radiation/quality/duration/timing/severity/associated sxs/prior Treatment) Patient is a 68 y.o. female presenting with vomiting and hyperglycemia. The history is provided by the patient and a relative. The history is limited by a language barrier.  Emesis Severity:  Severe Duration:  2 days Timing:  Constant Quality:  Stomach contents Feeding tolerance: none. Progression:  Unchanged Context: not post-tussive and not self-induced   Relieved by:  None tried Worsened by:  Nothing tried Associated symptoms: diarrhea   Associated symptoms: no abdominal pain and no fever   Risk factors: diabetes   Risk factors: no alcohol use   Hyperglycemia Associated symptoms: vomiting   Associated symptoms: no abdominal pain     Past Medical History  Diagnosis Date  . Diabetes mellitus   . Hypertension   . Asthma    History reviewed. No pertinent past surgical history. History reviewed. No pertinent family history. History  Substance Use Topics  . Smoking status: Former Research scientist (life sciences)  . Smokeless tobacco: Not on file  . Alcohol Use: No   OB History   Grav Para Term Preterm Abortions TAB SAB Ect Mult Living                 Review of Systems  Unable to perform ROS: Acuity of condition  Gastrointestinal: Positive for vomiting and diarrhea. Negative for abdominal pain.      Allergies  Other  Home Medications   Current Outpatient Rx  Name  Route  Sig  Dispense  Refill  . albuterol (PROVENTIL HFA;VENTOLIN HFA) 108 (90 BASE) MCG/ACT inhaler   Inhalation   Inhale 2 puffs into the lungs every 6 (six) hours as needed for wheezing or shortness of breath.         . beclomethasone (QVAR) 80 MCG/ACT inhaler   Inhalation   Inhale 2-4 puffs into the lungs every 4  (four) hours as needed (breathing). As needed for asthma.         . cetirizine (ZYRTEC) 10 MG tablet   Oral   Take 0.5 tablets (5 mg total) by mouth daily.   30 tablet   1   . gabapentin (NEURONTIN) 300 MG capsule   Oral   Take 1 capsule (300 mg total) by mouth 3 (three) times daily.   90 capsule   5   . glipiZIDE (GLUCOTROL XL) 10 MG 24 hr tablet   Oral   Take 2 tablets (20 mg total) by mouth every morning.   60 tablet   0   . ibuprofen (ADVIL,MOTRIN) 200 MG tablet   Oral   Take 200 mg by mouth every 6 (six) hours as needed. Pain.         Marland Kitchen lisinopril (PRINIVIL,ZESTRIL) 10 MG tablet   Oral   Take 1 tablet (10 mg total) by mouth at bedtime.   30 tablet   5   . metFORMIN (GLUCOPHAGE) 1000 MG tablet   Oral   Take 1 tablet (1,000 mg total) by mouth 2 (two) times daily with a meal.   60 tablet   0    BP 131/72  Pulse 93  Temp(Src) 98.4 F (36.9 C) (Oral)  Resp 19  Ht 5\' 6"  (1.676 m)  Wt 130 lb (58.968 kg)  BMI 20.99 kg/m2  SpO2 98% Physical Exam  Nursing note and vitals reviewed. Constitutional: She appears well-developed and well-nourished. She appears listless. She is cooperative.  Non-toxic appearance. She has a sickly appearance. She appears ill. No distress.  HENT:  Head: Normocephalic and atraumatic.  Mouth/Throat: Uvula is midline. Mucous membranes are not pale and dry. No posterior oropharyngeal edema or posterior oropharyngeal erythema.  Eyes: Conjunctivae are normal. No scleral icterus.  Neck: Normal range of motion. Neck supple.  Cardiovascular: Regular rhythm and intact distal pulses.  Tachycardia present.   Pulmonary/Chest: Tachypnea noted. No respiratory distress. She has no wheezes.  Abdominal: Soft. She exhibits no distension. There is no tenderness. There is no rebound and no guarding.  Neurological: She appears listless.  Skin: Skin is warm. No rash noted. She is diaphoretic.    ED Course  Procedures (including critical care  time)  CRITICAL CARE Performed by: Donzetta Matters. Total critical care time: 30 min Critical care time was exclusive of separately billable procedures and treating other patients. Critical care was necessary to treat or prevent imminent or life-threatening deterioration. Critical care was time spent personally by me on the following activities: development of treatment plan with patient and/or surrogate as well as nursing, discussions with consultants, evaluation of patient's response to treatment, examination of patient, obtaining history from patient or surrogate, ordering and performing treatments and interventions, ordering and review of laboratory studies, ordering and review of radiographic studies, pulse oximetry and re-evaluation of patient's condition.   Labs Review Labs Reviewed  CBC WITH DIFFERENTIAL - Abnormal; Notable for the following:    WBC 18.9 (*)    MCHC 36.4 (*)    Neutro Abs 11.3 (*)    Lymphs Abs 5.9 (*)    Monocytes Absolute 1.3 (*)    All other components within normal limits  COMPREHENSIVE METABOLIC PANEL - Abnormal; Notable for the following:    Sodium 124 (*)    Chloride 82 (*)    Glucose, Bld 692 (*)    BUN 40 (*)    Creatinine, Ser 1.15 (*)    Calcium 10.6 (*)    Total Protein 8.9 (*)    AST 40 (*)    ALT 54 (*)    GFR calc non Af Amer 48 (*)    GFR calc Af Amer 55 (*)    All other components within normal limits  URINALYSIS, ROUTINE W REFLEX MICROSCOPIC - Abnormal; Notable for the following:    APPearance CLOUDY (*)    Glucose, UA >1000 (*)    Hgb urine dipstick SMALL (*)    Protein, ur 30 (*)    Nitrite POSITIVE (*)    Leukocytes, UA SMALL (*)    All other components within normal limits  URINE MICROSCOPIC-ADD ON - Abnormal; Notable for the following:    Bacteria, UA MANY (*)    All other components within normal limits  CBG MONITORING, ED - Abnormal; Notable for the following:    Glucose-Capillary >600 (*)    All other components within  normal limits  CBG MONITORING, ED - Abnormal; Notable for the following:    Glucose-Capillary 593 (*)    All other components within normal limits  CBG MONITORING, ED - Abnormal; Notable for the following:    Glucose-Capillary 464 (*)    All other components within normal limits  URINE CULTURE   Imaging Review No results found.   EKG Interpretation   Date/Time:  Tuesday April 12 2013 20:16:11 EDT Ventricular Rate:  108 PR Interval:  126 QRS Duration: 91 QT Interval:  338 QTC Calculation: 453 R Axis:     Text Interpretation:  Sinus tachycardia Probable left ventricular  hypertrophy lateral ST depresssion, non specific, consider repol abn No  significant change since last tracing Confirmed by [facility]  MD, MICHEAL  ([id number]) on 04/12/2013 8:48:05 PM     RA sat is 93% and I interpret to be adequate  10:57 PM Nausea seems improved,   Filed Vitals:   04/12/13 2230  BP: 131/72  Pulse: 93  Temp:   Resp: 19   HR is improved, no longer tachycardic.  Will discuss with hospitalist for admission to tele.   10:57 PM Discussed with Dr. Caryn Section.  No ketones in urine, but + nitrites, thus Dr. Caryn Section would like to treat as DKA due to anion gap of 20, asks for urine culture and dose of abx and admit to step down which has been done.    MDM   Final diagnoses:  Hyperosmolar non-ketotic state in patient with type 2 diabetes mellitus  Nausea and vomiting in adult    Pt with h/o DM, refuses to take insulin, just oral pills, has been weak and now vomiting for the past 2 days.  Pt is clinically dehydrated, tachycardic, not hypotensive.  Seems listless but is communicative and cooperative.  Pt has bicarb of 22, has an anion gap of 20.  Will need IV insulin and IVF's and likely admission.      Saddie Benders. Dorna Mai, MD 04/12/13 [place] Dorna Mai, MD 04/12/13 2258

## 2013-04-12 NOTE — ED Notes (Signed)
Patient c/o nausea/vomiting, dizziness, and weakness.

## 2013-04-12 NOTE — ED Notes (Addendum)
cbg greater than critical high greater than 600.Marland KitchenMarland KitchenMarland KitchenMarland Kitchen

## 2013-04-12 NOTE — ED Notes (Signed)
Patient accompanied by her son, who is translating for her. Patient with vomiting that started yesterday. Son states patient has also been very weak.

## 2013-04-12 NOTE — H&P (Signed)
[facility] History and Physical  Nichole Munoz [id number] DOB: 21-Jan-1945 DOA: 04/12/2013  Referring physician: ED physician, Dr. Dorna Mai PCP: [place]   Chief Complaint: Nausea, vomiting, and generalized weakness.  HPI: Nichole Munoz is a 68 y.o. female with a history of type 2 diabetes mellitus, asthma, and hypertension, who presents with nausea, vomiting, and generalized weakness/fatigue. The history is being provided by her son, [name], due to the language barrier. Accordingly, over the past 2-3 days, the patient has had generalized weakness and lightheadedness. For 2 days, she has vomited at least 2-3 times. The gastric contents were yellow in color without bright red blood or coffee grounds consistency. Nevertheless, because of thirst, she attempted to drink a lot, but her liquids consisted primarily of regular [facility]. She denies abdominal pain, diarrhea, chest pain, or chest congestion, but she does acknowledge pain or burning with urination. She has also had some intermittent shortness of breath with activity and a nonproductive cough. She denies lower extremity swelling. She has had subjective fever and chills. She or her son does not monitor her blood glucose at home because they do not have a glucometer.  In the emergency department, she is afebrile and mildly tachycardic. Her blood pressure is within normal limits. Her lab data are significant for WBC of 18.9, sodium of 124, BUN of 40, creatinine of 1.15, AST of 40, ALT of 54, and glucose of 692. Her CO2 is 22, but her anion gap is 20. Her urinalysis is positive for nitrite, and many bacteria, and 3-6 WBCs. She is being admitted for further evaluation and management.     Review of Systems:  As above in history present illness. In addition, her review of systems is positive for fatigue, burning and numbness in her feet, sneezing and nasal congestion from seasonal allergies. Review of systems otherwise is  negative.   Past Medical History  Diagnosis Date  . Diabetes mellitus   . Hypertension   . Asthma   . Diabetic neuropathy   . Seasonal allergies   . Obesity    History reviewed. No pertinent past surgical history. Social History: She is widowed. She lives in [place] with her children. She is unemployed. She denies alcohol, tobacco, and illicit drug use, although she has a remote history of tobacco use. Generally, she is independent of her ADLs.  Allergies  Allergen Reactions  . Other     Chlorox.  Cannot tolerate smell.     Family history: Both her parents died of old age without any significant medical problems.   Prior to Admission medications   Medication Sig Start Date End Date Taking? Authorizing Provider  albuterol (PROVENTIL HFA;VENTOLIN HFA) 108 (90 BASE) MCG/ACT inhaler Inhale 2 puffs into the lungs every 6 (six) hours as needed for wheezing or shortness of breath.   Yes Historical Provider, MD  beclomethasone (QVAR) 80 MCG/ACT inhaler Inhale 2-4 puffs into the lungs every 4 (four) hours as needed (breathing). As needed for asthma. 01/22/12  Yes Virgel Manifold, MD  cetirizine (ZYRTEC) 10 MG tablet Take 0.5 tablets (5 mg total) by mouth daily. 10/27/11  Yes Shari A Upstill, PA-C  gabapentin (NEURONTIN) 300 MG capsule Take 1 capsule (300 mg total) by mouth 3 (three) times daily. 01/22/12  Yes Virgel Manifold, MD  glipiZIDE (GLUCOTROL XL) 10 MG 24 hr tablet Take 2 tablets (20 mg total) by mouth every morning. 12/03/12  Yes Leota Jacobsen, MD  ibuprofen (ADVIL,MOTRIN) 200 MG tablet Take 200  mg by mouth every 6 (six) hours as needed. Pain.   Yes Historical Provider, MD  lisinopril (PRINIVIL,ZESTRIL) 10 MG tablet Take 1 tablet (10 mg total) by mouth at bedtime. 01/22/12  Yes Virgel Manifold, MD  metFORMIN (GLUCOPHAGE) 1000 MG tablet Take 1 tablet (1,000 mg total) by mouth 2 (two) times daily with a meal. 12/03/12  Yes Leota Jacobsen, MD   Physical Exam: Filed Vitals:   04/12/13  2230  BP: 131/72  Pulse: 93  Temp:   Resp: 19    BP 131/72  Pulse 93  Temp(Src) 98.4 F (36.9 C) (Oral)  Resp 19  Ht 5\' 6"  (1.676 m)  Wt 58.968 kg (130 lb)  BMI 20.99 kg/m2  SpO2 98%  General:  [place] 68 year old woman laying in bed, in no acute distress. She appears ill. Eyes: PERRL, normal lids, irises & conjunctiva ENT: oropharynx mucous membranes are very dry. Several missing teeth. No posterior exudates or erythema. Neck: no LAD, masses or thyromegaly Cardiovascular: S1, S2, with borderline tachycardia.  No LE edema. pedal pulses palpable.  Telemetry: SR, no arrhythmias  Respiratory: CTA bilaterally, no w/r/r. Normal respiratory effort. Abdomen:  positive bowel sounds, mildly obese,soft, nontender, nondistended. No masses palpated. Skin: no rash or induration seen on limited exam Musculoskeletal: grossly normal tone BUE/BLE Psychiatric: grossly normal mood and affect, speech fluent and appropriate Neurologic: grossly non-focal. cranial nerves II through XII are grossly intact.          Labs on Admission:  Basic Metabolic Panel:  Recent Labs Lab 04/12/13 2025  NA 124*  K 4.2  CL 82*  CO2 22  GLUCOSE 692*  BUN 40*  CREATININE 1.15*  CALCIUM 10.6*   Liver Function Tests:  Recent Labs Lab 04/12/13 2025  AST 40*  ALT 54*  ALKPHOS 103  BILITOT 0.9  PROT 8.9*  ALBUMIN 4.4   No results found for this basename: LIPASE, AMYLASE,  in the last 168 hours No results found for this basename: AMMONIA,  in the last 168 hours CBC:  Recent Labs Lab 04/12/13 2025  WBC 18.9*  NEUTROABS 11.3*  HGB 15.0  HCT 41.2  MCV 83.4  PLT 234   Cardiac Enzymes: No results found for this basename: CKTOTAL, CKMB, CKMBINDEX, TROPONINI,  in the last 168 hours  BNP (last 3 results) No results found for this basename: PROBNP,  in the last 8760 hours CBG:  Recent Labs Lab 04/12/13 2020 04/12/13 2102 04/12/13 2225 04/12/13 2327  GLUCAP >600* 593* 464* 436*     Radiological Exams on Admission: No results found.  EKG: Independently reviewed. Sinus tachycardia with a heart rate of 108 beats per minute and nonspecific ST abnormalities.   Assessment/Plan Principal Problem:   DKA, type 2 Active Problems:   UTI (urinary tract infection)   Nausea & vomiting   Hyponatremia   ARF (acute renal failure)   HYPERTENSION   ASTHMA   Diabetic neuropathy   1. DKA in a patient with type 2 diabetes mellitus. Per her son, the patient has refused insulin therapy in the past. She is on glipizide and metformin chronically. She does not check her blood sugars at home because she does not have a glucometer. Her son says that the pharmacy would not give him one with a prescription(??). Nevertheless, she presents in DKA with a venous glucose of almost 700 and an anion gap of 20. The patient probably needs long-term therapy with insulin. She was started on an insulin drip and given 2 L  of IV fluids in the emergency department. We'll continue the insulin drip, IV fluids, BMETs, and capillary blood glucose monitoring via the Glucomander protocol. We'll order a hemoglobin A1c. 2. Nausea and vomiting, likely secondary to DKA and possibly urinary tract infection. She will be treated with as needed Zofran. Will add H2 blocker with Pepcid IV daily. 3. Urinary tract infection. Rocephin was given in the emergency department. This will be continued. Urine culture to ordered and pending. 4. Acute renal failure secondary to dehydration/prerenal azotemia in the setting of ACE inhibitor therapy. Will treat with vigorous IV fluids and hold lisinopril. 5. Hyponatremia, secondary to hyperglycemia and hypovolemia. Treat with normal saline infusion. 6. Hypertension. She is treated chronically with lisinopril. It will be held while she is n.p.o. When necessary hydralazine has been ordered for systolic blood pressure of 170 or above. 7. Asthma. Currently stable. Continue when necessary  bronchodilators. 8. Leukocytosis, likely secondary to DKA and possibly UTI. Continue to follow.   Code Status: Full code  Family Communication: Discuss with son  Disposition Plan: Discharge to home in a few days when medically appropriate   Time spent:One hour   [facility] Pager [phone]

## 2013-04-13 ENCOUNTER — Encounter (HOSPITAL_COMMUNITY): Payer: Self-pay

## 2013-04-13 DIAGNOSIS — R112 Nausea with vomiting, unspecified: Secondary | ICD-10-CM

## 2013-04-13 LAB — GLUCOSE, CAPILLARY
GLUCOSE-CAPILLARY: 124 mg/dL — AB (ref 70–99)
GLUCOSE-CAPILLARY: 158 mg/dL — AB (ref 70–99)
GLUCOSE-CAPILLARY: 170 mg/dL — AB (ref 70–99)
GLUCOSE-CAPILLARY: 214 mg/dL — AB (ref 70–99)
GLUCOSE-CAPILLARY: 333 mg/dL — AB (ref 70–99)
Glucose-Capillary: 260 mg/dL — ABNORMAL HIGH (ref 70–99)
Glucose-Capillary: 184 mg/dL — ABNORMAL HIGH (ref 70–99)
Glucose-Capillary: 153 mg/dL — ABNORMAL HIGH (ref 70–99)
Glucose-Capillary: 157 mg/dL — ABNORMAL HIGH (ref 70–99)
Glucose-Capillary: 167 mg/dL — ABNORMAL HIGH (ref 70–99)
Glucose-Capillary: 316 mg/dL — ABNORMAL HIGH (ref 70–99)
Glucose-Capillary: 365 mg/dL — ABNORMAL HIGH (ref 70–99)
Glucose-Capillary: 457 mg/dL — ABNORMAL HIGH (ref 70–99)

## 2013-04-13 LAB — HEPATIC FUNCTION PANEL
ALT: 37 U/L — ABNORMAL HIGH (ref 0–35)
AST: 33 U/L (ref 0–37)
Albumin: 3 g/dL — ABNORMAL LOW (ref 3.5–5.2)
Alkaline Phosphatase: 72 U/L (ref 39–117)
Bilirubin, Direct: <0.2 mg/dL (ref 0.0–0.3)
TOTAL PROTEIN: 6.7 g/dL (ref 6.0–8.3)
Total Bilirubin: 0.9 mg/dL (ref 0.3–1.2)

## 2013-04-13 LAB — BASIC METABOLIC PANEL
BUN: 31 mg/dL — ABNORMAL HIGH (ref 6–23)
BUN: 30 mg/dL — ABNORMAL HIGH (ref 6–23)
BUN: 28 mg/dL — ABNORMAL HIGH (ref 6–23)
BUN: 33 mg/dL — AB (ref 6–23)
CALCIUM: 8.7 mg/dL (ref 8.4–10.5)
CHLORIDE: 97 meq/L (ref 96–112)
CHLORIDE: 99 meq/L (ref 96–112)
CHLORIDE: 100 meq/L (ref 96–112)
CO2: 20 mEq/L (ref 19–32)
CO2: 22 meq/L (ref 19–32)
CO2: 25 mEq/L (ref 19–32)
CO2: 25 meq/L (ref 19–32)
CREATININE: 1.03 mg/dL (ref 0.50–1.10)
Calcium: 8.3 mg/dL — ABNORMAL LOW (ref 8.4–10.5)
Calcium: 8.4 mg/dL (ref 8.4–10.5)
Calcium: 8.4 mg/dL (ref 8.4–10.5)
Chloride: 95 mEq/L — ABNORMAL LOW (ref 96–112)
Creatinine, Ser: 1.04 mg/dL (ref 0.50–1.10)
Creatinine, Ser: 1.07 mg/dL (ref 0.50–1.10)
Creatinine, Ser: 1.1 mg/dL (ref 0.50–1.10)
GFR calc Af Amer: 63 mL/min — ABNORMAL LOW (ref >90)
GFR calc Af Amer: 60 mL/min — ABNORMAL LOW (ref >90)
GFR calc Af Amer: 58 mL/min — ABNORMAL LOW (ref >90)
GFR calc non Af Amer: 52 mL/min — ABNORMAL LOW (ref >90)
GFR calc non Af Amer: 54 mL/min — ABNORMAL LOW (ref >90)
GFR calc non Af Amer: 55 mL/min — ABNORMAL LOW (ref >90)
GFR, EST AFRICAN AMERICAN: 63 mL/min — AB (ref >90)
GFR, EST NON AFRICAN AMERICAN: 50 mL/min — AB (ref >90)
GLUCOSE: 142 mg/dL — AB (ref 70–99)
GLUCOSE: 163 mg/dL — AB (ref 70–99)
GLUCOSE: 436 mg/dL — AB (ref 70–99)
Glucose, Bld: 276 mg/dL — ABNORMAL HIGH (ref 70–99)
POTASSIUM: 4.3 meq/L (ref 3.7–5.3)
POTASSIUM: 4.4 meq/L (ref 3.7–5.3)
Potassium: 4 mEq/L (ref 3.7–5.3)
Potassium: 4 mEq/L (ref 3.7–5.3)
SODIUM: 136 meq/L — AB (ref 137–147)
Sodium: 132 mEq/L — ABNORMAL LOW (ref 137–147)
Sodium: 134 mEq/L — ABNORMAL LOW (ref 137–147)
Sodium: 137 mEq/L (ref 137–147)

## 2013-04-13 LAB — HEMOGLOBIN A1C
Hgb A1c MFr Bld: 10.5 % — ABNORMAL HIGH (ref <5.7)
MEAN PLASMA GLUCOSE: 255 mg/dL — AB (ref <117)

## 2013-04-13 LAB — LIPASE, BLOOD: Lipase: 38 U/L (ref 11–59)

## 2013-04-13 LAB — MRSA PCR SCREENING: MRSA by PCR: NEGATIVE

## 2013-04-13 MED ORDER — INSULIN GLARGINE 100 UNIT/ML ~~LOC~~ SOLN
25.0000 [IU] | Freq: Every day | SUBCUTANEOUS | Status: DC
  Administered 2013-04-13: 25 [IU] via SUBCUTANEOUS
  Filled 2013-04-13 (×2): qty 0.25

## 2013-04-13 MED ORDER — INSULIN ASPART 100 UNIT/ML ~~LOC~~ SOLN
0.0000 [IU] | Freq: Three times a day (TID) | SUBCUTANEOUS | Status: DC
  Administered 2013-04-13: 11 [IU] via SUBCUTANEOUS
  Administered 2013-04-13: 15 [IU] via SUBCUTANEOUS
  Administered 2013-04-14: 5 [IU] via SUBCUTANEOUS
  Administered 2013-04-14: 11 [IU] via SUBCUTANEOUS
  Administered 2013-04-14: 8 [IU] via SUBCUTANEOUS
  Administered 2013-04-15: 15 [IU] via SUBCUTANEOUS
  Administered 2013-04-15: 8 [IU] via SUBCUTANEOUS

## 2013-04-13 MED ORDER — SODIUM CHLORIDE 0.9 % IV SOLN: INTRAVENOUS | Status: DC

## 2013-04-13 MED ORDER — UNABLE TO FIND: Status: DC

## 2013-04-13 NOTE — Progress Notes (Signed)
CARE MANAGEMENT NOTE 04/13/2013  Patient:  Nichole Munoz, Nichole Munoz   Account Number:  [id number]  Date Initiated:  04/13/2013  Documentation initiated by:  Tequlia Gonsalves  Subjective/Objective Assessment:   pt with hx of diabetes present with hx of n&v glucose greater than  600.     Action/Plan:   plan to return home once stable   Anticipated DC Date:  04/16/2013   Anticipated DC Plan:  HOME/SELF CARE  In-house referral  NA      DC Planning Services  NA      [facility] Choice  NA   Choice offered to / List presented to:  NA   DME arranged  NA      DME agency  NA     [place] arranged  NA      [place] agency  NA   Status of service:  In process, will continue to follow Medicare Important Message given?  NA - LOS <3 / Initial given by admissions (If response is "NO", the following Medicare IM given date fields will be blank) Date Medicare IM given:   Date Additional Medicare IM given:    Discharge Disposition:    Per UR Regulation:  Reviewed for med. necessity/level of care/duration of stay  If discussed at [place] of Stay Meetings, dates discussed:    Comments:  [id number]/Sneha Willig Eldridge Dace, [place], [place] [phone] Chart Reviewed for discharge and hospital needs. Discharge needs at time of review: None present will follow for needs. Review of patient progress due on [id number].

## 2013-04-13 NOTE — Progress Notes (Signed)
Inpatient Diabetes Program Recommendations  AACE/ADA: New Consensus Statement on Inpatient Glycemic Control (2013)  Target Ranges:  Prepandial:   less than 140 mg/dL      Peak postprandial:   less than 180 mg/dL (1-2 hours)      Critically ill patients:  140 - 180 mg/dL   Reason for Visit: Diabetes Consult  Diabetes history: DM2 Outpatient Diabetes medications: glipizide 10 mg bid, metformin 1000 mg bid Current orders for Inpatient glycemic control: Lantus 25 units QD, Novolog moderate tidwc  Admitted for DKA. GlucoStabilizer then transitioned to Lantus and Novolog. Does not check blood sugars at home. Will need prescription for meter, strips and lancets. Encouraged son and daughter to view diabetes videos on pt ed channel. Gave "Where do I begin" and ADA Diabetes spanish educational materials to family. Pt drinks sodas, does not exercise regularly. Much room for improvement with diet and exercise. Discussed HgbA1C results, diet, exercise and how they affect blood sugars. Stressed importance of f/u with PCP to manage DM. Pt has been hesitant to start insulin. Pt's son will likely give injections. Taught daughter use of insulin pen and she was able to return demonstration. Answered questions and family would benefit from attending OP Diabetes Education. Will order same.  Inpatient Diabetes Program Recommendations Correction (SSI): Increase Novolog to resistant tidwc and hs Insulin - Meal Coverage: Add Novolog 4 units tidwc for meal coverage insulin HgbA1C: 10.5% - uncontrolled  Note: Continue to titrate Lantus and Novolog until blood sugars <180 mg/dL. Diabetes Coordinator to f/u for questions.  Thank you. Lorenda Peck, RD, LDN, CDE Inpatient Diabetes Coordinator [phone]

## 2013-04-13 NOTE — Progress Notes (Signed)
MD text paged about patients elevated HS blood sugar of 457.

## 2013-04-13 NOTE — Progress Notes (Signed)
Handoff report called to [place], Therapist, sports.  Patient transferred via wheelchair.  She is alert and oriented.  Family at her side.

## 2013-04-13 NOTE — Progress Notes (Signed)
[facility] PROGRESS NOTE  Nichole Munoz [id number] DOB: 11-20-1945 DOA: 04/12/2013 PCP: [facility]  Assessment/Plan: 1. DKA -uncontrolled DM, poorly compliant with meds and wishes not to be on Insulin -anion gap corrected -start Lantus, and stop insulin gtt, SSI -change IVF to NS -advance diet -hbaic 10.5 -Insulin teaching for SON and DM coordinator consult  2. Possible UTI -continue ceftriaxone, FU urine Cx  3. Hyponatremia -due to 1 and dehydration improving  4. Noncompliance -counseled  5. Asthma  -stable, nebs PRN  DVT proph: hep SQ  Code Status: Full Code Family Communication: d/w son at bedside Disposition Plan: transfer to med-surg   Antibiotics:  ceftriaxone   HPI/Subjective: Feels better, still some nausea  Objective: Filed Vitals:   04/13/13 1206  BP: 119/64  Pulse: 88  Temp:   Resp: 22    Intake/Output Summary (Last 24 hours) at 04/13/13 1346 Last data filed at 04/13/13 1000  Gross per 24 hour  Intake 2978.7 ml  Output    300 ml  Net 2678.7 ml   Filed Weights   04/12/13 2012  Weight: 58.968 kg (130 lb)    Exam:   General:  [name] speaking in her native language, no distress  Cardiovascular: S!S2/RRR  Respiratory: CTAB  Abdomen: soft, Nt, BS present  Musculoskeletal: no edema c/c  Data Reviewed: Basic Metabolic Panel:  Recent Labs Lab 04/12/13 2025 04/12/13 2342 04/13/13 0126 04/13/13 0315 04/13/13 0540  NA 124* 132* 134* 136* 137  K 4.2 4.3 4.0 4.0 4.4  CL 82* 95* 97 99 100  CO2 22 20 22 25 25   GLUCOSE 692* 436* 276* 142* 163*  BUN 40* 33* 31* 30* 28*  CREATININE 1.15* 1.04 1.03 1.07 1.10  CALCIUM 10.6* 8.4 8.4 8.7 8.3*   Liver Function Tests:  Recent Labs Lab 04/12/13 2025 04/13/13 0540  AST 40* 33  ALT 54* 37*  ALKPHOS 103 72  BILITOT 0.9 0.9  PROT 8.9* 6.7  ALBUMIN 4.4 3.0*    Recent Labs Lab 04/13/13 0540  LIPASE 38   No results found for this basename:  AMMONIA,  in the last 168 hours CBC:  Recent Labs Lab 04/12/13 2025  WBC 18.9*  NEUTROABS 11.3*  HGB 15.0  HCT 41.2  MCV 83.4  PLT 234   Cardiac Enzymes: No results found for this basename: CKTOTAL, CKMB, CKMBINDEX, TROPONINI,  in the last 168 hours BNP (last 3 results) No results found for this basename: PROBNP,  in the last 8760 hours CBG:  Recent Labs Lab 04/13/13 0640 04/13/13 0744 04/13/13 0854 04/13/13 0955 04/13/13 1139  GLUCAP 170* 158* 167* 214* 316*    Recent Results (from the past 240 hour(s))  MRSA PCR SCREENING     Status: None   Collection Time    04/13/13 12:02 AM      Result Value Ref Range Status   MRSA by PCR NEGATIVE  NEGATIVE Final   Comment:            The GeneXpert MRSA Assay (FDA     approved for NASAL specimens     only), is one component of a     comprehensive MRSA colonization     surveillance program. It is not     intended to diagnose MRSA     infection nor to guide or     monitor treatment for     MRSA infections.     Studies: No results found.  Scheduled Meds: . cefTRIAXone (ROCEPHIN)  IV  1 g Intravenous Q24H  . famotidine (PEPCID) IV  20 mg Intravenous Q24H  . fluticasone  1 puff Inhalation BID  . heparin  5,000 Units Subcutaneous 3 times per day  . insulin aspart  0-15 Units Subcutaneous TID WC  . insulin glargine  25 Units Subcutaneous Daily   Continuous Infusions: . sodium chloride    . sodium chloride 75 mL/hr (04/13/13 1115)  . dextrose 5 % and 0.45% NaCl 100 mL/hr at 04/13/13 0230  . insulin (NOVOLIN-R) infusion 4.6 Units/hr (04/13/13 1000)   Antibiotics Given (last 72 hours)   None      Principal Problem:   DKA, type 2 Active Problems:   HYPERTENSION   ASTHMA   UTI (urinary tract infection)   Nausea & vomiting   ARF (acute renal failure)   Hyponatremia   Diabetic neuropathy   Elevated LFTs    Time spent: 3min    Nichole Munoz  [facility] Pager [phone]. If 7PM-7AM, please contact  night-coverage at [email or web address], password [facility] 04/13/2013, 1:46 PM  LOS: 1 day

## 2013-04-14 LAB — BASIC METABOLIC PANEL
BUN: 33 mg/dL — ABNORMAL HIGH (ref 6–23)
CO2: 23 mEq/L (ref 19–32)
Calcium: 8 mg/dL — ABNORMAL LOW (ref 8.4–10.5)
Chloride: 97 mEq/L (ref 96–112)
Creatinine, Ser: 1.12 mg/dL — ABNORMAL HIGH (ref 0.50–1.10)
GFR calc Af Amer: 57 mL/min — ABNORMAL LOW (ref >90)
GFR calc non Af Amer: 49 mL/min — ABNORMAL LOW (ref >90)
Glucose, Bld: 466 mg/dL — ABNORMAL HIGH (ref 70–99)
Potassium: 4.3 mEq/L (ref 3.7–5.3)
Sodium: 130 mEq/L — ABNORMAL LOW (ref 137–147)

## 2013-04-14 LAB — GLUCOSE, CAPILLARY
GLUCOSE-CAPILLARY: 306 mg/dL — AB (ref 70–99)
GLUCOSE-CAPILLARY: 254 mg/dL — AB (ref 70–99)
Glucose-Capillary: 243 mg/dL — ABNORMAL HIGH (ref 70–99)
Glucose-Capillary: 269 mg/dL — ABNORMAL HIGH (ref 70–99)

## 2013-04-14 MED ORDER — INSULIN ASPART 100 UNIT/ML ~~LOC~~ SOLN
0.0000 [IU] | Freq: Every day | SUBCUTANEOUS | Status: DC
Start: 2013-04-14 — End: 2013-04-15
  Administered 2013-04-14: 3 [IU] via SUBCUTANEOUS

## 2013-04-14 MED ORDER — INSULIN ASPART 100 UNIT/ML ~~LOC~~ SOLN
10.0000 [IU] | Freq: Once | SUBCUTANEOUS | Status: AC
  Administered 2013-04-14: 10 [IU] via SUBCUTANEOUS

## 2013-04-14 MED ORDER — INSULIN GLARGINE 100 UNIT/ML ~~LOC~~ SOLN
10.0000 [IU] | Freq: Once | SUBCUTANEOUS | Status: AC
  Administered 2013-04-14: 10 [IU] via SUBCUTANEOUS
  Filled 2013-04-14: qty 0.1

## 2013-04-14 MED ORDER — INSULIN GLARGINE 100 UNIT/ML ~~LOC~~ SOLN
25.0000 [IU] | Freq: Two times a day (BID) | SUBCUTANEOUS | Status: DC
  Administered 2013-04-14: 25 [IU] via SUBCUTANEOUS

## 2013-04-14 MED ORDER — INSULIN ASPART 100 UNIT/ML ~~LOC~~ SOLN: 0.0000 [IU] | Freq: Three times a day (TID) | SUBCUTANEOUS | Status: DC

## 2013-04-14 MED ORDER — INSULIN ASPART 100 UNIT/ML ~~LOC~~ SOLN
6.0000 [IU] | Freq: Three times a day (TID) | SUBCUTANEOUS | Status: DC
  Administered 2013-04-14 – 2013-04-15 (×4): 6 [IU] via SUBCUTANEOUS

## 2013-04-14 MED ORDER — INSULIN GLARGINE 100 UNIT/ML ~~LOC~~ SOLN
35.0000 [IU] | Freq: Every day | SUBCUTANEOUS | Status: DC
  Filled 2013-04-14: qty 0.35

## 2013-04-14 MED ORDER — INSULIN GLARGINE 100 UNIT/ML ~~LOC~~ SOLN
10.0000 [IU] | Freq: Every day | SUBCUTANEOUS | Status: DC
  Filled 2013-04-14: qty 0.1

## 2013-04-14 NOTE — Progress Notes (Signed)
[facility] PROGRESS NOTE  Nichole Munoz [id number] DOB: March 22, 1945 DOA: 04/12/2013 PCP: [facility]  Assessment/Plan: 1. DKA -uncontrolled DM, poorly compliant with meds and family/son to administer Insulin -anion gap corrected off Insulin gtt 4/8 -increase Lantus to 35Units, add meal coverage, appreciate Dm coordinator help -cut down IVF -hbaic 10.5 -Insulin teaching for SON   2. Possible UTI -continue ceftriaxone, FU urine Cx  3. Hyponatremia -due to 1 and dehydration improving  4. Noncompliance -counseled  5. Asthma  -stable, nebs PRN  DVT proph: hep SQ  Code Status: Full Code Family Communication: d/w daughter at bedside Disposition Plan: home tomorrow   Antibiotics:  ceftriaxone   HPI/Subjective: Feels better, no complaints, no further N/V Daughter learning Insulin admin  Objective: Filed Vitals:   04/14/13 1014  BP: 138/89  Pulse: 85  Temp: 98.2 F (36.8 C)  Resp: 16    Intake/Output Summary (Last 24 hours) at 04/14/13 1128 Last data filed at 04/14/13 1110  Gross per 24 hour  Intake 646.25 ml  Output    175 ml  Net 471.25 ml   Filed Weights   04/12/13 2012  Weight: 58.968 kg (130 lb)    Exam:   General:  [name] speaking in her native language, no distress  Cardiovascular: S!S2/RRR  Respiratory: CTAB  Abdomen: soft, Nt, BS present  Musculoskeletal: no edema c/c  Data Reviewed: Basic Metabolic Panel:  Recent Labs Lab 04/12/13 2342 04/13/13 0126 04/13/13 0315 04/13/13 0540 04/14/13 0007  NA 132* 134* 136* 137 130*  K 4.3 4.0 4.0 4.4 4.3  CL 95* 97 99 100 97  CO2 20 22 25 25 23   GLUCOSE 436* 276* 142* 163* 466*  BUN 33* 31* 30* 28* 33*  CREATININE 1.04 1.03 1.07 1.10 1.12*  CALCIUM 8.4 8.4 8.7 8.3* 8.0*   Liver Function Tests:  Recent Labs Lab 04/12/13 2025 04/13/13 0540  AST 40* 33  ALT 54* 37*  ALKPHOS 103 72  BILITOT 0.9 0.9  PROT 8.9* 6.7  ALBUMIN 4.4 3.0*    Recent  Labs Lab 04/13/13 0540  LIPASE 38   No results found for this basename: AMMONIA,  in the last 168 hours CBC:  Recent Labs Lab 04/12/13 2025  WBC 18.9*  NEUTROABS 11.3*  HGB 15.0  HCT 41.2  MCV 83.4  PLT 234   Cardiac Enzymes: No results found for this basename: CKTOTAL, CKMB, CKMBINDEX, TROPONINI,  in the last 168 hours BNP (last 3 results) No results found for this basename: PROBNP,  in the last 8760 hours CBG:  Recent Labs Lab 04/13/13 0955 04/13/13 1139 04/13/13 1624 04/13/13 2248 04/14/13 0802  GLUCAP 214* 316* 365* 457* 243*    Recent Results (from the past 240 hour(s))  URINE CULTURE     Status: None   Collection Time    04/12/13 10:17 PM      Result Value Ref Range Status   Specimen Description URINE, CLEAN CATCH   Final   Special Requests Normal   Final   Culture  Setup Time     Final   Value: 04/13/2013 04:03     Performed at [place] PENDING   Incomplete   Culture     Final   Value: Culture reincubated for better growth     Performed at [facility]   Report Status PENDING   Incomplete  MRSA PCR SCREENING     Status: None   Collection Time  04/13/13 12:02 AM      Result Value Ref Range Status   MRSA by PCR NEGATIVE  NEGATIVE Final   Comment:            The GeneXpert MRSA Assay (FDA     approved for NASAL specimens     only), is one component of a     comprehensive MRSA colonization     surveillance program. It is not     intended to diagnose MRSA     infection nor to guide or     monitor treatment for     MRSA infections.     Studies: No results found.  Scheduled Meds: . cefTRIAXone (ROCEPHIN)  IV  1 g Intravenous Q24H  . famotidine (PEPCID) IV  20 mg Intravenous Q24H  . fluticasone  1 puff Inhalation BID  . heparin  5,000 Units Subcutaneous 3 times per day  . insulin aspart  0-15 Units Subcutaneous TID WC  . insulin aspart  0-5 Units Subcutaneous QHS  . insulin aspart  6 Units Subcutaneous TID  WC  . insulin glargine  10 Units Subcutaneous Once  . [START ON 04/15/2013] insulin glargine  35 Units Subcutaneous Daily   Continuous Infusions: . sodium chloride 75 mL/hr (04/13/13 1115)  . insulin (NOVOLIN-R) infusion Stopped (04/13/13 1100)   Antibiotics Given (last 72 hours)   Date/Time Action Medication Dose Rate   04/13/13 2216 Given   cefTRIAXone (ROCEPHIN) 1 g in dextrose 5 % 50 mL IVPB 1 g 100 mL/hr      Principal Problem:   DKA, type 2 Active Problems:   HYPERTENSION   ASTHMA   UTI (urinary tract infection)   Nausea & vomiting   ARF (acute renal failure)   Hyponatremia   Diabetic neuropathy   Elevated LFTs    Time spent: 70min    Darrow Barreiro  [facility] Pager [phone]. If 7PM-7AM, please contact night-coverage at [email or web address], password [facility] 04/14/2013, 11:28 AM  LOS: 2 days

## 2013-04-14 NOTE — Progress Notes (Signed)
Inpatient Diabetes Program Recommendations  AACE/ADA: New Consensus Statement on Inpatient Glycemic Control (2013)  Target Ranges:  Prepandial:   less than 140 mg/dL      Peak postprandial:   less than 180 mg/dL (1-2 hours)      Critically ill patients:  140 - 180 mg/dL  Results for NOUNReem, Roopnarine (MRN [id number]) as of 04/14/2013 11:16  Ref. Range 04/13/2013 09:55 04/13/2013 11:39 04/13/2013 16:24 04/13/2013 22:48 04/14/2013 08:02  Glucose-Capillary Latest Range: 70-99 mg/dL 214 (H) 316 (H) 365 (H) 457 (H) 243 (H)   This coordinator spoke with Dr. Broadus John concerning insulin adjustments this morning.   Recommend: increase Lantus to 35 units this morning. Add Novolog 6 units TID for meal coverage.  Will reassess tomorrow 4/10 and make further recommendations for home regimen. . Thank you  Raoul Pitch BSN, RN,CDE Inpatient Diabetes Coordinator [phone] (team pager)

## 2013-04-15 LAB — BASIC METABOLIC PANEL
BUN: 31 mg/dL — ABNORMAL HIGH (ref 6–23)
CALCIUM: 8.7 mg/dL (ref 8.4–10.5)
CO2: 24 mEq/L (ref 19–32)
Chloride: 104 mEq/L (ref 96–112)
Creatinine, Ser: 1.02 mg/dL (ref 0.50–1.10)
GFR calc non Af Amer: 55 mL/min — ABNORMAL LOW (ref >90)
GFR, EST AFRICAN AMERICAN: 64 mL/min — AB (ref >90)
Glucose, Bld: 283 mg/dL — ABNORMAL HIGH (ref 70–99)
Potassium: 4.1 mEq/L (ref 3.7–5.3)
SODIUM: 139 meq/L (ref 137–147)

## 2013-04-15 LAB — GLUCOSE, CAPILLARY
Glucose-Capillary: 277 mg/dL — ABNORMAL HIGH (ref 70–99)
Glucose-Capillary: 404 mg/dL — ABNORMAL HIGH (ref 70–99)

## 2013-04-15 MED ORDER — GLIPIZIDE ER 10 MG PO TB24: 10.0000 mg | ORAL_TABLET | Freq: Every morning | ORAL | Status: DC

## 2013-04-15 MED ORDER — INSULIN GLARGINE 100 UNIT/ML ~~LOC~~ SOLN
45.0000 [IU] | Freq: Every day | SUBCUTANEOUS | Status: DC
  Administered 2013-04-15: 45 [IU] via SUBCUTANEOUS
  Filled 2013-04-15: qty 0.45

## 2013-04-15 MED ORDER — INSULIN GLARGINE 100 UNIT/ML SOLOSTAR PEN: 45.0000 [IU] | PEN_INJECTOR | Freq: Every morning | SUBCUTANEOUS | Status: DC

## 2013-04-15 MED ORDER — UNABLE TO FIND: Status: DC

## 2013-04-15 NOTE — Progress Notes (Signed)
I explained and demonstrated to her son on how to administer Lantus with a FlexPen.  The son preformed a return demonstration and stated he understood how to administer it.

## 2013-04-15 NOTE — Discharge Summary (Signed)
Patient discharged home with son.  IV removed.  Reviewed discharged summary and instructions with patient and her son, both understood the instructions.

## 2013-04-15 NOTE — Progress Notes (Addendum)
Inpatient Diabetes Program Recommendations  AACE/ADA: New Consensus Statement on Inpatient Glycemic Control (2013)  Target Ranges:  Prepandial:   less than 140 mg/dL      Peak postprandial:   less than 180 mg/dL (1-2 hours)      Critically ill patients:  140 - 180 mg/dL  Results for NOUNKiomara, Colchado (MRN [id number]) as of 04/15/2013 07:54  Ref. Range 04/14/2013 12:14 04/14/2013 18:54 04/14/2013 21:05 04/15/2013 07:26  Glucose-Capillary Latest Range: 70-99 mg/dL 306 (H) 254 (H) 269 (H) 277 (H)   Note: Fasting CBG 277 this morning.  This coordinator will speak with patient to make a plan for home regimen. Following... ADD: spoke with patient, son and other family members present about regimen for home.  Patient not excited about insulin at home but she is willing.  Pt has Medicare and Medicaid so Lantus SoloStar pens are covered.  Pt and family have been taught how to use the pen.  Recommend Lantus 45 and Glipizide 5 mg BID for discharge.  Ultimately patient may need rapid acting insulin with meals instead of Glipizide but PCP can address at follow-up.  Basal/bolus regimen is probably more than she is willing to do at this point and adherence is a concern.  Thank you  Raoul Pitch BSN, RN,CDE Inpatient Diabetes Coordinator [phone] (team pager)

## 2013-04-16 LAB — URINE CULTURE: Special Requests: NORMAL

## 2013-05-11 NOTE — Discharge Summary (Signed)
Physician Discharge Summary  Nichole Munoz [id number] DOB: 1945/06/15 DOA: 04/12/2013  PCP: [facility]  Admit date: 04/12/2013 Discharge date: 04/15/2013  Time spent: 45 minutes  Recommendations for Outpatient Follow-up:  1. PCP in 1 week  Discharge Diagnoses:  Principal Problem:   DKA, type 2 Active Problems:   HYPERTENSION   ASTHMA   UTI (urinary tract infection)   Nausea & vomiting   ARF (acute renal failure)   Hyponatremia   Diabetic neuropathy   Elevated LFTs   Discharge Condition:stable  Diet recommendation: diabetic  Filed Weights   04/12/13 2012  Weight: 58.968 kg (130 lb)    History of present illness:  Nichole Munoz is a 68 y.o. female with a history of type 2 diabetes mellitus, asthma, and hypertension, who presents with nausea, vomiting, and generalized weakness/fatigue. The history is being provided by her son, Nichole Munoz, due to the language barrier. Accordingly, over the past 2-3 days, the patient has had generalized weakness and lightheadedness. For 2 days, she has vomited at least 2-3 times. The gastric contents were yellow in color without bright red blood or coffee grounds consistency. Nevertheless, because of thirst, she attempted to drink a lot, but her liquids consisted primarily of regular [facility]. She denies abdominal pain, diarrhea, chest pain, or chest congestion, but she does acknowledge pain or burning with urination. She has also had some intermittent shortness of breath with activity and a nonproductive cough. She denies lower extremity swelling. She has had subjective fever and chills. She or her son does not monitor her blood glucose at home because they do not have a glucometer.  In the emergency department, she is afebrile and mildly tachycardic. Her blood pressure is within normal limits. Her lab data are significant for WBC of 18.9, sodium of 124, BUN of 40, creatinine of 1.15, AST of 40, ALT of 54, and glucose of 692. Her CO2  is 22, but her anion gap is 20. Her urinalysis is positive for nitrite, and many bacteria, and 3-6 WBCs. She is being admitted for further evaluation and management.   Hospital Course:  1. DKA -uncontrolled DM, poorly compliant with meds and diet -family/son to administer Insulin at home from now -anion gap corrected off Insulin gtt since 4/8  -increased Lantus to 35Units, add meal coverage, appreciate Dm coordinator help -due to expected challenges with compliance we decided to hold off on adding meal coverage Insulin for now, please consider this upon FU as needed  -hbaic 10.5  -Insulin teaching for SON completed  2. Possible UTI  -stopped ceftriaxone, urine Cx negative  3. Hyponatremia  -due to 1 and dehydration improved  4. Noncompliance  -counseled   5. Asthma  -stable, nebs PRN   Discharge Exam: Filed Vitals:   04/15/13 0544  BP: 154/87  Pulse: 88  Temp: 98.2 F (36.8 C)  Resp: 18    General: [name]3, no distress Cardiovascular: S1S2/RRR Respiratory: CTAB  Discharge Instructions You were cared for by a hospitalist during your hospital stay. If you have any questions about your discharge medications or the care you received while you were in the hospital after you are discharged, you can call the unit and asked to speak with the hospitalist on call if the hospitalist that took care of you is not available. Once you are discharged, your primary care physician will handle any further medical issues. Please note that NO REFILLS for any discharge medications will be authorized once you are discharged, as it  is imperative that you return to your primary care physician (or establish a relationship with a primary care physician if you do not have one) for your aftercare needs so that they can reassess your need for medications and monitor your lab values.  Discharge Orders   Future Orders Complete By Expires   Diet Carb Modified  As directed    Increase activity slowly  As  directed        Medication List    STOP taking these medications       ibuprofen 200 MG tablet  Commonly known as:  ADVIL,MOTRIN      TAKE these medications       albuterol 108 (90 BASE) MCG/ACT inhaler  Commonly known as:  PROVENTIL HFA;VENTOLIN HFA  Inhale 2 puffs into the lungs every 6 (six) hours as needed for wheezing or shortness of breath.     beclomethasone 80 MCG/ACT inhaler  Commonly known as:  QVAR  Inhale 2-4 puffs into the lungs every 4 (four) hours as needed (breathing). As needed for asthma.     cetirizine 10 MG tablet  Commonly known as:  ZYRTEC  Take 0.5 tablets (5 mg total) by mouth daily.     gabapentin 300 MG capsule  Commonly known as:  NEURONTIN  Take 1 capsule (300 mg total) by mouth 3 (three) times daily.     glipiZIDE 10 MG 24 hr tablet  Commonly known as:  GLUCOTROL XL  Take 1 tablet (10 mg total) by mouth every morning.     Insulin Glargine 100 UNIT/ML Solostar Pen  Commonly known as:  LANTUS SOLOSTAR  Inject 45 Units into the skin every morning.     lisinopril 10 MG tablet  Commonly known as:  PRINIVIL,ZESTRIL  Take 1 tablet (10 mg total) by mouth at bedtime.     metFORMIN 1000 MG tablet  Commonly known as:  GLUCOPHAGE  Take 1 tablet (1,000 mg total) by mouth 2 (two) times daily with a meal.     UNABLE TO FIND  Glucometer     UNABLE TO FIND  Insulin test strips     UNABLE TO FIND  Lancets       Allergies  Allergen Reactions  . Other     Chlorox.  Cannot tolerate smell.        Follow-up Information   Follow up with [facility]. Schedule an appointment as soon as possible for a visit in 1 week.   Contact information:   [place] [phone]        The results of significant diagnostics from this hospitalization (including imaging, microbiology, ancillary and laboratory) are listed below for reference.    Significant Diagnostic Studies: No results  found.  Microbiology: No results found for this or any previous visit (from the past 240 hour(s)).   Labs: Basic Metabolic Panel: No results found for this basename: NA, K, CL, CO2, GLUCOSE, BUN, CREATININE, CALCIUM, MG, PHOS,  in the last 168 hours Liver Function Tests: No results found for this basename: AST, ALT, ALKPHOS, BILITOT, PROT, ALBUMIN,  in the last 168 hours No results found for this basename: LIPASE, AMYLASE,  in the last 168 hours No results found for this basename: AMMONIA,  in the last 168 hours CBC: No results found for this basename: WBC, NEUTROABS, HGB, HCT, MCV, PLT,  in the last 168 hours Cardiac Enzymes: No results found for this basename: CKTOTAL, CKMB, CKMBINDEX, TROPONINI,  in the last 168 hours BNP: BNP (last 3 results) No results found for this basename: PROBNP,  in the last 8760 hours CBG: No results found for this basename: GLUCAP,  in the last 168 hours     Signed:  [facility] 05/11/2013, 4:49 PM

## 2013-11-02 ENCOUNTER — Other Ambulatory Visit: Payer: Self-pay | Admitting: Gastroenterology

## 2013-12-05 ENCOUNTER — Emergency Department (HOSPITAL_COMMUNITY): Payer: Medicare Other

## 2013-12-05 ENCOUNTER — Emergency Department (HOSPITAL_COMMUNITY)
Admission: EM | Admit: 2013-12-05 | Discharge: 2013-12-05 | Disposition: A | Discharge status: Home / Self Care | Payer: Medicare Other | Attending: Emergency Medicine | Admitting: Emergency Medicine

## 2013-12-05 ENCOUNTER — Encounter (HOSPITAL_COMMUNITY): Payer: Self-pay | Admitting: Emergency Medicine

## 2013-12-05 DIAGNOSIS — R059 Cough, unspecified: Secondary | ICD-10-CM

## 2013-12-05 DIAGNOSIS — Z87891 Personal history of nicotine dependence: Secondary | ICD-10-CM | POA: Insufficient documentation

## 2013-12-05 DIAGNOSIS — E669 Obesity, unspecified: Secondary | ICD-10-CM | POA: Insufficient documentation

## 2013-12-05 DIAGNOSIS — J40 Bronchitis, not specified as acute or chronic: Secondary | ICD-10-CM

## 2013-12-05 DIAGNOSIS — E1140 Type 2 diabetes mellitus with diabetic neuropathy, unspecified: Secondary | ICD-10-CM | POA: Insufficient documentation

## 2013-12-05 DIAGNOSIS — Z79899 Other long term (current) drug therapy: Secondary | ICD-10-CM | POA: Insufficient documentation

## 2013-12-05 DIAGNOSIS — R079 Chest pain, unspecified: Secondary | ICD-10-CM

## 2013-12-05 DIAGNOSIS — Z794 Long term (current) use of insulin: Secondary | ICD-10-CM | POA: Diagnosis not present

## 2013-12-05 DIAGNOSIS — R05 Cough: Secondary | ICD-10-CM

## 2013-12-05 DIAGNOSIS — J45909 Unspecified asthma, uncomplicated: Secondary | ICD-10-CM | POA: Diagnosis not present

## 2013-12-05 DIAGNOSIS — I10 Essential (primary) hypertension: Secondary | ICD-10-CM | POA: Insufficient documentation

## 2013-12-05 MED ORDER — AZITHROMYCIN 250 MG PO TABS: ORAL_TABLET | ORAL | Status: DC

## 2013-12-05 NOTE — ED Notes (Signed)
Initial Contact - pt resting on stretcher, family at bedside, placed to cardiac/02 monitor.  Pt reports cough prod white sputum x"9-10 days".  Pt denies fevers/chills, SOB.  Pt describes pleuritic chest pain, worse with cough and deep inspiration. Speaking full/clear sentences, rr even/un-lab.  NAD.

## 2013-12-05 NOTE — Discharge Instructions (Signed)
Use your inhaler as needed. Take antibiotics as directed.  If you were given medicines take as directed.  If you are on coumadin or contraceptives realize their levels and effectiveness is altered by many different medicines.  If you have any reaction (rash, tongues swelling, other) to the medicines stop taking and see a physician.   Please follow up as directed and return to the ER or see a physician for new or worsening symptoms.  Thank you. Filed Vitals:   12/05/13 0945  BP: 151/71  Pulse: 99  Temp: 98.1 F (36.7 C)  TempSrc: Oral  Resp: 20  SpO2: 100%

## 2013-12-05 NOTE — ED Notes (Signed)
Pt co cough for 10 days, also co chest wall pain when coughing. No cardiac hx. Productive cough, white thick sputum. No fever or chills.

## 2013-12-05 NOTE — ED Provider Notes (Signed)
CSN: [id number]     Arrival date & time 12/05/13  [id number] History   First MD Initiated Contact with Patient 12/05/13 [phone]     Chief Complaint  Patient presents with  . Cough     (Consider location/radiation/quality/duration/timing/severity/associated sxs/prior Treatment) HPI Comments: 68 year old female with history of high blood pressure, diabetes, renal failure presents with productive cough gradual worsening for 9-10 days and mild pain with coughing only. Patient has no cardiac or blood clot history. No fevers or chills. Her son had similar symptoms when this started. No current antibiotics. Patient has albuterol inhaler as needed at home.  Patient is a 68 y.o. female presenting with cough. The history is provided by the patient.  Cough Associated symptoms: no chills, no fever, no headaches, no rash and no shortness of breath     Past Medical History  Diagnosis Date  . Diabetes mellitus   . Hypertension   . Asthma   . Diabetic neuropathy   . Seasonal allergies   . Obesity    History reviewed. No pertinent past surgical history. No family history on file. History  Substance Use Topics  . Smoking status: Former Research scientist (life sciences)  . Smokeless tobacco: Never Used  . Alcohol Use: No   OB History    No data available     Review of Systems  Constitutional: Negative for fever and chills.  HENT: Positive for congestion.   Respiratory: Positive for cough. Negative for shortness of breath.   Gastrointestinal: Negative for vomiting and abdominal pain.  Musculoskeletal: Negative for back pain, neck pain and neck stiffness.  Skin: Negative for rash.  Neurological: Negative for light-headedness and headaches.      Allergies  Other  Home Medications   Prior to Admission medications   Medication Sig Start Date End Date Taking? Authorizing Provider  albuterol (PROVENTIL HFA;VENTOLIN HFA) 108 (90 BASE) MCG/ACT inhaler Inhale 2 puffs into the lungs every 6 (six) hours as needed for  wheezing or shortness of breath.    Historical Provider, MD  azithromycin (ZITHROMAX Z-PAK) 250 MG tablet 2 po day one, then 1 daily x 4 days 12/05/13   Mariea Clonts, MD  beclomethasone (QVAR) 80 MCG/ACT inhaler Inhale 2-4 puffs into the lungs every 4 (four) hours as needed (breathing). As needed for asthma. 01/22/12   Virgel Manifold, MD  cetirizine (ZYRTEC) 10 MG tablet Take 0.5 tablets (5 mg total) by mouth daily. 10/27/11   Shari A Upstill, PA-C  gabapentin (NEURONTIN) 300 MG capsule Take 1 capsule (300 mg total) by mouth 3 (three) times daily. 01/22/12   Virgel Manifold, MD  glipiZIDE (GLUCOTROL XL) 10 MG 24 hr tablet Take 1 tablet (10 mg total) by mouth every morning. 04/15/13   Domenic Polite, MD  Insulin Glargine (LANTUS SOLOSTAR) 100 UNIT/ML Solostar Pen Inject 45 Units into the skin every morning. 04/15/13   Domenic Polite, MD  lisinopril (PRINIVIL,ZESTRIL) 10 MG tablet Take 1 tablet (10 mg total) by mouth at bedtime. 01/22/12   Virgel Manifold, MD  metFORMIN (GLUCOPHAGE) 1000 MG tablet Take 1 tablet (1,000 mg total) by mouth 2 (two) times daily with a meal. 12/03/12   Leota Jacobsen, MD  UNABLE TO FIND Glucometer 04/15/13   Domenic Polite, MD  UNABLE TO FIND Insulin test strips 04/15/13   Domenic Polite, MD  UNABLE TO FIND Lancets 04/15/13   Domenic Polite, MD   BP 151/71 mmHg  Pulse 99  Temp(Src) 98.1 F (36.7 C) (Oral)  Resp 20  SpO2 100% Physical  Exam  Constitutional: She is oriented to person, place, and time. She appears well-developed and well-nourished.  HENT:  Head: Normocephalic and atraumatic.  Mild dry mucous membranes  Eyes: Conjunctivae are normal. Right eye exhibits no discharge. Left eye exhibits no discharge.  Neck: Normal range of motion. Neck supple. No tracheal deviation present.  Cardiovascular: Normal rate and regular rhythm.   Pulmonary/Chest: Effort normal. She has rales (few at bases.).  Musculoskeletal: She exhibits no edema.  Neurological: She is alert and  oriented to person, place, and time.  Skin: Skin is warm. No rash noted.  Psychiatric: She has a normal mood and affect.  Nursing note and vitals reviewed.   ED Course  Procedures (including critical care time) Labs Review Labs Reviewed - No data to display  Imaging Review Dg Chest 2 View  12/05/2013   CLINICAL DATA:  Chest pain  EXAM: CHEST  2 VIEW  COMPARISON:  02/05/2012  FINDINGS: Mild bibasilar atelectasis. No definite pneumonia. Negative for heart failure or effusion. Heart size is normal. Negative for mass lesion.  IMPRESSION: Mild bibasilar atelectasis.   Electronically Signed   By: Franchot Gallo M.D.   On: 12/05/2013 10:08     EKG Interpretation   Date/Time:  Monday December 05 2013 09:55:05 EST Ventricular Rate:  96 PR Interval:  134 QRS Duration: 78 QT Interval:  352 QTC Calculation: 445 R Axis:   37 Text Interpretation:  Sinus rhythm Baseline wander in lead(s) V6 Confirmed  by Dilraj Killgore  MD, Cammy Sanjurjo ([id number]) on 12/05/2013 9:57:49 AM Also confirmed by  Reather Converse  MD, Akon Reinoso ([id number])  on 12/05/2013 10:07:26 AM      MDM   Final diagnoses:  Bronchitis  Cough   patient clinically with bronchitis/Mucor pneumonia. Patient having significant productive cough in the room and with 9-10 days duration plan for trial of antibiotics especially with her being diabetic. EKG reviewed no acute findings. Patient only has chest discomfort with coughing. Discussed reasons to return. Chest x-ray reviewed no acute infiltrate. If you were given medicines take as directed.  If you are on coumadin or contraceptives realize their levels and effectiveness is altered by many different medicines.  If you have any reaction (rash, tongues swelling, other) to the medicines stop taking and see a physician.   Please follow up as directed and return to the ER or see a physician for new or worsening symptoms.  Thank you. Filed Vitals:   12/05/13 0945  BP: 151/71  Pulse: 99  Temp: 98.1 F (36.7 C)   TempSrc: Oral  Resp: 20  SpO2: 100%      Mariea Clonts, MD 12/05/13 1100

## 2014-01-06 DIAGNOSIS — K5792 Diverticulitis of intestine, part unspecified, without perforation or abscess without bleeding: Secondary | ICD-10-CM

## 2014-01-06 HISTORY — DX: Diverticulitis of intestine, part unspecified, without perforation or abscess without bleeding: K57.92

## 2014-01-17 ENCOUNTER — Emergency Department (HOSPITAL_COMMUNITY): Payer: Medicare Other

## 2014-01-17 ENCOUNTER — Encounter (HOSPITAL_COMMUNITY): Payer: Self-pay | Admitting: *Deleted

## 2014-01-17 ENCOUNTER — Emergency Department (HOSPITAL_COMMUNITY)
Admission: EM | Admit: 2014-01-17 | Discharge: 2014-01-17 | Disposition: A | Discharge status: Home / Self Care | Payer: Medicare Other | Attending: Emergency Medicine | Admitting: Emergency Medicine

## 2014-01-17 DIAGNOSIS — Z87891 Personal history of nicotine dependence: Secondary | ICD-10-CM | POA: Insufficient documentation

## 2014-01-17 DIAGNOSIS — Z7951 Long term (current) use of inhaled steroids: Secondary | ICD-10-CM | POA: Insufficient documentation

## 2014-01-17 DIAGNOSIS — J069 Acute upper respiratory infection, unspecified: Secondary | ICD-10-CM | POA: Insufficient documentation

## 2014-01-17 DIAGNOSIS — D72829 Elevated white blood cell count, unspecified: Secondary | ICD-10-CM | POA: Diagnosis not present

## 2014-01-17 DIAGNOSIS — R000 Tachycardia, unspecified: Secondary | ICD-10-CM | POA: Insufficient documentation

## 2014-01-17 DIAGNOSIS — R51 Headache: Secondary | ICD-10-CM

## 2014-01-17 DIAGNOSIS — R519 Headache, unspecified: Secondary | ICD-10-CM

## 2014-01-17 DIAGNOSIS — Z794 Long term (current) use of insulin: Secondary | ICD-10-CM | POA: Insufficient documentation

## 2014-01-17 DIAGNOSIS — Z79899 Other long term (current) drug therapy: Secondary | ICD-10-CM | POA: Diagnosis not present

## 2014-01-17 DIAGNOSIS — R251 Tremor, unspecified: Secondary | ICD-10-CM | POA: Insufficient documentation

## 2014-01-17 DIAGNOSIS — J45909 Unspecified asthma, uncomplicated: Secondary | ICD-10-CM | POA: Insufficient documentation

## 2014-01-17 DIAGNOSIS — E1140 Type 2 diabetes mellitus with diabetic neuropathy, unspecified: Secondary | ICD-10-CM | POA: Diagnosis not present

## 2014-01-17 DIAGNOSIS — Z792 Long term (current) use of antibiotics: Secondary | ICD-10-CM | POA: Insufficient documentation

## 2014-01-17 DIAGNOSIS — E669 Obesity, unspecified: Secondary | ICD-10-CM | POA: Diagnosis not present

## 2014-01-17 DIAGNOSIS — R42 Dizziness and giddiness: Secondary | ICD-10-CM | POA: Diagnosis present

## 2014-01-17 DIAGNOSIS — I10 Essential (primary) hypertension: Secondary | ICD-10-CM | POA: Diagnosis not present

## 2014-01-17 DIAGNOSIS — R05 Cough: Secondary | ICD-10-CM

## 2014-01-17 DIAGNOSIS — R799 Abnormal finding of blood chemistry, unspecified: Secondary | ICD-10-CM | POA: Diagnosis not present

## 2014-01-17 DIAGNOSIS — R059 Cough, unspecified: Secondary | ICD-10-CM

## 2014-01-17 LAB — BASIC METABOLIC PANEL
ANION GAP: 7 (ref 5–15)
BUN: 34 mg/dL — AB (ref 6–23)
CALCIUM: 8.1 mg/dL — AB (ref 8.4–10.5)
CHLORIDE: 100 meq/L (ref 96–112)
CO2: 27 mmol/L (ref 19–32)
CREATININE: 1.71 mg/dL — AB (ref 0.50–1.10)
GFR calc Af Amer: 34 mL/min — ABNORMAL LOW (ref >90)
GFR, EST NON AFRICAN AMERICAN: 29 mL/min — AB (ref >90)
Glucose, Bld: 124 mg/dL — ABNORMAL HIGH (ref 70–99)
POTASSIUM: 4.2 mmol/L (ref 3.5–5.1)
Sodium: 134 mmol/L — ABNORMAL LOW (ref 135–145)

## 2014-01-17 LAB — URINALYSIS, ROUTINE W REFLEX MICROSCOPIC
BILIRUBIN URINE: NEGATIVE
GLUCOSE, UA: NEGATIVE mg/dL
Hgb urine dipstick: NEGATIVE
Ketones, ur: NEGATIVE mg/dL
Leukocytes, UA: NEGATIVE
NITRITE: NEGATIVE
Protein, ur: NEGATIVE mg/dL
Specific Gravity, Urine: 1.012 (ref 1.005–1.030)
Urobilinogen, UA: 1 mg/dL (ref 0.0–1.0)
pH: 8 (ref 5.0–8.0)

## 2014-01-17 LAB — CBC WITH DIFFERENTIAL/PLATELET
Basophils Absolute: 0.1 10*3/uL (ref 0.0–0.1)
Basophils Relative: 1 % (ref 0–1)
Eosinophils Absolute: 1.4 10*3/uL — ABNORMAL HIGH (ref 0.0–0.7)
Eosinophils Relative: 8 % — ABNORMAL HIGH (ref 0–5)
HEMATOCRIT: 36.8 % (ref 36.0–46.0)
HEMOGLOBIN: 12.8 g/dL (ref 12.0–15.0)
LYMPHS ABS: 3.1 10*3/uL (ref 0.7–4.0)
LYMPHS PCT: 19 % (ref 12–46)
MCH: 31.3 pg (ref 26.0–34.0)
MCHC: 34.8 g/dL (ref 30.0–36.0)
MCV: 90 fL (ref 78.0–100.0)
MONO ABS: 0.9 10*3/uL (ref 0.1–1.0)
MONOS PCT: 6 % (ref 3–12)
NEUTROS PCT: 66 % (ref 43–77)
Neutro Abs: 10.9 10*3/uL — ABNORMAL HIGH (ref 1.7–7.7)
Platelets: 222 10*3/uL (ref 150–400)
RBC: 4.09 MIL/uL (ref 3.87–5.11)
RDW: 12.6 % (ref 11.5–15.5)
WBC: 16.4 10*3/uL — ABNORMAL HIGH (ref 4.0–10.5)

## 2014-01-17 MED ORDER — ACETAMINOPHEN 325 MG PO TABS
650.0000 mg | ORAL_TABLET | Freq: Once | ORAL | Status: AC
  Administered 2014-01-17: 650 mg via ORAL
  Filled 2014-01-17: qty 2

## 2014-01-17 MED ORDER — SODIUM CHLORIDE 0.9 % IV BOLUS (SEPSIS)
1000.0000 mL | INTRAVENOUS | Status: AC
  Administered 2014-01-17: 1000 mL via INTRAVENOUS

## 2014-01-17 MED ORDER — METOCLOPRAMIDE HCL 5 MG/ML IJ SOLN
5.0000 mg | Freq: Once | INTRAMUSCULAR | Status: AC
  Administered 2014-01-17: 5 mg via INTRAVENOUS
  Filled 2014-01-17: qty 2

## 2014-01-17 MED ORDER — DIPHENHYDRAMINE HCL 50 MG/ML IJ SOLN
12.5000 mg | Freq: Once | INTRAMUSCULAR | Status: AC
  Administered 2014-01-17: 12.5 mg via INTRAVENOUS
  Filled 2014-01-17: qty 1

## 2014-01-17 NOTE — ED Notes (Signed)
Pt from home via [facility] c/o sore throat,cough, and headache since yesterday.

## 2014-01-17 NOTE — ED Notes (Signed)
Pt's son reports pt c/o shaking/chills and sore throat x 2 days.  Reports pt's BP is elevated, has hx of HTN.

## 2014-01-17 NOTE — ED Provider Notes (Signed)
CSN: [id number]     Arrival date & time 01/17/14  1908 History   First MD Initiated Contact with Patient 01/17/14 1959     Chief Complaint  Patient presents with  . Dizziness  . Sore Throat  . Shaking     (Consider location/radiation/quality/duration/timing/severity/associated sxs/prior Treatment) Patient is a 69 y.o. female presenting with dizziness, pharyngitis, and headaches. The history is provided by the patient.  Dizziness Associated symptoms: headaches   Associated symptoms: no chest pain, no diarrhea, no nausea, no shortness of breath and no vomiting   Sore Throat Associated symptoms include headaches. Pertinent negatives include no chest pain, no abdominal pain and no shortness of breath.  Headache Pain location:  Frontal Quality:  Dull Radiates to:  Does not radiate Severity currently:  10/10 Severity at highest:  10/10 Onset quality:  Gradual Duration:  2 days Timing:  Constant Progression:  Unchanged Chronicity:  New Context comment:  URI Relieved by:  Nothing Worsened by:  Nothing tried Ineffective treatments:  None tried Associated symptoms: congestion, cough, dizziness and sore throat   Associated symptoms: no abdominal pain, no back pain, no diarrhea, no pain, no fatigue, no fever, no nausea, no neck pain and no vomiting     Past Medical History  Diagnosis Date  . Diabetes mellitus   . Hypertension   . Asthma   . Diabetic neuropathy   . Seasonal allergies   . Obesity    History reviewed. No pertinent past surgical history. No family history on file. History  Substance Use Topics  . Smoking status: Former Research scientist (life sciences)  . Smokeless tobacco: Never Used  . Alcohol Use: No   OB History    No data available     Review of Systems  Constitutional: Positive for chills. Negative for fever and fatigue.  HENT: Positive for congestion and sore throat. Negative for drooling.   Eyes: Negative for pain.  Respiratory: Positive for cough. Negative for shortness  of breath.   Cardiovascular: Negative for chest pain.  Gastrointestinal: Negative for nausea, vomiting, abdominal pain and diarrhea.  Genitourinary: Negative for dysuria and hematuria.  Musculoskeletal: Negative for back pain, gait problem and neck pain.  Skin: Negative for color change.  Neurological: Positive for dizziness and headaches.  Hematological: Negative for adenopathy.  Psychiatric/Behavioral: Negative for behavioral problems.  All other systems reviewed and are negative.     Allergies  Other  Home Medications   Prior to Admission medications   Medication Sig Start Date End Date Taking? Authorizing Provider  albuterol (PROVENTIL HFA;VENTOLIN HFA) 108 (90 BASE) MCG/ACT inhaler Inhale 2 puffs into the lungs every 6 (six) hours as needed for wheezing or shortness of breath.   Yes Historical Provider, MD  beclomethasone (QVAR) 80 MCG/ACT inhaler Inhale 2-4 puffs into the lungs every 4 (four) hours as needed (breathing). As needed for asthma. 01/22/12  Yes Virgel Manifold, MD  cetirizine (ZYRTEC) 10 MG tablet Take 0.5 tablets (5 mg total) by mouth daily. 10/27/11  Yes Shari A Upstill, PA-C  gabapentin (NEURONTIN) 300 MG capsule Take 1 capsule (300 mg total) by mouth 3 (three) times daily. Patient taking differently: Take 300 mg by mouth daily.  01/22/12  Yes Virgel Manifold, MD  glipiZIDE (GLUCOTROL XL) 10 MG 24 hr tablet Take 1 tablet (10 mg total) by mouth every morning. 04/15/13  Yes Domenic Polite, MD  glucose blood test strip 1 each by Other route 3 (three) times daily as needed for other (blood sugar.). Use as instructed  Yes Historical Provider, MD  Insulin Glargine (LANTUS SOLOSTAR) 100 UNIT/ML Solostar Pen Inject 45 Units into the skin every morning. 04/15/13  Yes Domenic Polite, MD  Lancets MISC 1 each by Does not apply route 3 (three) times daily as needed (test blood sugar.).   Yes Historical Provider, MD  lisinopril (PRINIVIL,ZESTRIL) 10 MG tablet Take 1 tablet (10 mg total)  by mouth at bedtime. 01/22/12  Yes Virgel Manifold, MD  metFORMIN (GLUCOPHAGE) 1000 MG tablet Take 1 tablet (1,000 mg total) by mouth 2 (two) times daily with a meal. 12/03/12  Yes Leota Jacobsen, MD  UNABLE TO FIND Glucometer 04/15/13  Yes Domenic Polite, MD  azithromycin (ZITHROMAX Z-PAK) 250 MG tablet 2 po day one, then 1 daily x 4 days 12/05/13   Mariea Clonts, MD  UNABLE TO FIND Insulin test strips Patient not taking: Reported on 01/17/2014 04/15/13   Domenic Polite, MD  UNABLE TO FIND Lancets Patient not taking: Reported on 01/17/2014 04/15/13   Domenic Polite, MD   BP 145/88 mmHg  Pulse 106  Temp(Src) 99.8 F (37.7 C) (Oral)  Resp 18  SpO2 99% Physical Exam  Constitutional: She is oriented to person, place, and time. She appears well-developed and well-nourished.  HENT:  Head: Normocephalic.  Mouth/Throat: Oropharynx is clear and moist. No oropharyngeal exudate.  Eyes: Conjunctivae and EOM are normal. Pupils are equal, round, and reactive to light.  Neck: Normal range of motion. Neck supple.  Cardiovascular: Normal rate, regular rhythm, normal heart sounds and intact distal pulses.  Exam reveals no gallop and no friction rub.   No murmur heard. Pulmonary/Chest: Effort normal and breath sounds normal. No respiratory distress. She has no wheezes.  Abdominal: Soft. Bowel sounds are normal. There is no tenderness. There is no rebound and no guarding.  Musculoskeletal: Normal range of motion. She exhibits no edema or tenderness.  Neurological: She is alert and oriented to person, place, and time.  alert, oriented x3 speech: normal in context and clarity memory: intact grossly cranial nerves II-XII: intact motor strength: full proximally and distally no involuntary movements or tremors sensation: intact to light touch diffusely  cerebellar: finger-to-nose and heel-to-shin intact gait: normal gait, recurrence of dizziness w/ ambulation  Skin: Skin is warm and dry.  Psychiatric: She  has a normal mood and affect. Her behavior is normal.  Nursing note and vitals reviewed.   ED Course  Procedures (including critical care time) Labs Review Labs Reviewed  CBC WITH DIFFERENTIAL - Abnormal; Notable for the following:    WBC 16.4 (*)    Neutro Abs 10.9 (*)    Eosinophils Relative 8 (*)    Eosinophils Absolute 1.4 (*)    All other components within normal limits  BASIC METABOLIC PANEL - Abnormal; Notable for the following:    Sodium 134 (*)    Glucose, Bld 124 (*)    BUN 34 (*)    Creatinine, Ser 1.71 (*)    Calcium 8.1 (*)    GFR calc non Af Amer 29 (*)    GFR calc Af Amer 34 (*)    All other components within normal limits  URINALYSIS, ROUTINE W REFLEX MICROSCOPIC    Imaging Review Dg Chest 2 View  01/17/2014   CLINICAL DATA:  Cough.  EXAM: CHEST  2 VIEW  COMPARISON:  December 05, 2013.  FINDINGS: The heart size and mediastinal contours are within normal limits. Both lungs are clear. No pneumothorax or pleural effusion is noted. The visualized skeletal structures  are unremarkable.  IMPRESSION: No acute cardiopulmonary abnormality seen.   Electronically Signed   By: Sabino Dick M.D.   On: 01/17/2014 21:29   Ct Head Wo Contrast  01/17/2014   CLINICAL DATA:  Headache since yesterday.  EXAM: CT HEAD WITHOUT CONTRAST  TECHNIQUE: Contiguous axial images were obtained from the base of the skull through the vertex without intravenous contrast.  COMPARISON:  CT scan of September 20, 2009.  FINDINGS: Bony calvarium appears intact. No mass effect or midline shift is noted. Ventricular size is within normal limits. There is no evidence of mass lesion, hemorrhage or acute infarction.  IMPRESSION: Normal head CT.   Electronically Signed   By: Sabino Dick M.D.   On: 01/17/2014 21:35     EKG Interpretation None      MDM   Final diagnoses:  Headache  Cough  Viral URI    8:47 PM 69 y.o. female w hx of DM, HTN  Who presents with multiple complaints including headache,  sore throat, cough , chills, dizziness which began 2 days ago. The patient notes gradual onset headache which is global but worse in the frontal area. The son denies any fevers. She is afebrile and mildly tachycardic here. Vital signs otherwise unremarkable. She is a normal neurologic exam. Likely a viral URI.   10:30 PM: Mildly elev Cr and leukocytosis here. She did get 1 L IVF. She notes mild improvement in her sx here. Imaging/labs otherwise non-contrib. I have discussed the diagnosis/risks/treatment options with the patient and family and believe the pt to be eligible for discharge home to follow-up with her pcp in the next 2 days. We also discussed returning to the ED immediately if new or worsening sx occur. We discussed the sx which are most concerning (e.g., worsening HA, fever, vomiting) that necessitate immediate return. Medications administered to the patient during their visit and any new prescriptions provided to the patient are listed below.  Medications given during this visit Medications  sodium chloride 0.9 % bolus 1,000 mL (1,000 mLs Intravenous New Bag/Given 01/17/14 2056)  metoCLOPramide (REGLAN) injection 5 mg (5 mg Intravenous Given 01/17/14 2056)  diphenhydrAMINE (BENADRYL) injection 12.5 mg (12.5 mg Intravenous Given 01/17/14 2056)  acetaminophen (TYLENOL) tablet 650 mg (650 mg Oral Given 01/17/14 2056)    New Prescriptions   No medications on file     Pamella Pert, MD 01/18/14 1103

## 2014-06-29 ENCOUNTER — Emergency Department (HOSPITAL_COMMUNITY): Payer: Medicare Other

## 2014-06-29 ENCOUNTER — Emergency Department (HOSPITAL_COMMUNITY)
Admission: EM | Admit: 2014-06-29 | Discharge: 2014-06-29 | Disposition: A | Discharge status: Home / Self Care | Payer: Medicare Other | Attending: Emergency Medicine | Admitting: Emergency Medicine

## 2014-06-29 ENCOUNTER — Encounter (HOSPITAL_COMMUNITY): Payer: Self-pay | Admitting: *Deleted

## 2014-06-29 DIAGNOSIS — Z794 Long term (current) use of insulin: Secondary | ICD-10-CM | POA: Diagnosis not present

## 2014-06-29 DIAGNOSIS — Z79899 Other long term (current) drug therapy: Secondary | ICD-10-CM | POA: Diagnosis not present

## 2014-06-29 DIAGNOSIS — E669 Obesity, unspecified: Secondary | ICD-10-CM | POA: Diagnosis not present

## 2014-06-29 DIAGNOSIS — R42 Dizziness and giddiness: Secondary | ICD-10-CM

## 2014-06-29 DIAGNOSIS — Z87891 Personal history of nicotine dependence: Secondary | ICD-10-CM | POA: Diagnosis not present

## 2014-06-29 DIAGNOSIS — R079 Chest pain, unspecified: Secondary | ICD-10-CM | POA: Diagnosis present

## 2014-06-29 DIAGNOSIS — Z792 Long term (current) use of antibiotics: Secondary | ICD-10-CM | POA: Diagnosis not present

## 2014-06-29 DIAGNOSIS — J45909 Unspecified asthma, uncomplicated: Secondary | ICD-10-CM | POA: Diagnosis not present

## 2014-06-29 DIAGNOSIS — E1140 Type 2 diabetes mellitus with diabetic neuropathy, unspecified: Secondary | ICD-10-CM | POA: Insufficient documentation

## 2014-06-29 DIAGNOSIS — I10 Essential (primary) hypertension: Secondary | ICD-10-CM | POA: Insufficient documentation

## 2014-06-29 DIAGNOSIS — N289 Disorder of kidney and ureter, unspecified: Secondary | ICD-10-CM

## 2014-06-29 DIAGNOSIS — H81399 Other peripheral vertigo, unspecified ear: Secondary | ICD-10-CM

## 2014-06-29 DIAGNOSIS — Z7951 Long term (current) use of inhaled steroids: Secondary | ICD-10-CM | POA: Diagnosis not present

## 2014-06-29 LAB — BASIC METABOLIC PANEL
Anion gap: 9 (ref 5–15)
BUN: 26 mg/dL — ABNORMAL HIGH (ref 6–20)
CHLORIDE: 102 mmol/L (ref 101–111)
CO2: 29 mmol/L (ref 22–32)
Calcium: 9 mg/dL (ref 8.9–10.3)
Creatinine, Ser: 1.28 mg/dL — ABNORMAL HIGH (ref 0.44–1.00)
GFR, EST AFRICAN AMERICAN: 48 mL/min — AB (ref >60)
GFR, EST NON AFRICAN AMERICAN: 41 mL/min — AB (ref >60)
GLUCOSE: 129 mg/dL — AB (ref 65–99)
POTASSIUM: 4.3 mmol/L (ref 3.5–5.1)
Sodium: 140 mmol/L (ref 135–145)

## 2014-06-29 LAB — CBC
HEMATOCRIT: 37.4 % (ref 36.0–46.0)
Hemoglobin: 12.9 g/dL (ref 12.0–15.0)
MCH: 29.6 pg (ref 26.0–34.0)
MCHC: 34.5 g/dL (ref 30.0–36.0)
MCV: 85.8 fL (ref 78.0–100.0)
Platelets: 232 10*3/uL (ref 150–400)
RBC: 4.36 MIL/uL (ref 3.87–5.11)
RDW: 11.9 % (ref 11.5–15.5)
WBC: 10.1 10*3/uL (ref 4.0–10.5)

## 2014-06-29 LAB — CBG MONITORING, ED
GLUCOSE-CAPILLARY: 114 mg/dL — AB (ref 65–99)
Glucose-Capillary: 73 mg/dL (ref 65–99)

## 2014-06-29 LAB — I-STAT TROPONIN, ED: Troponin i, poc: 0 ng/mL (ref 0.00–0.08)

## 2014-06-29 MED ORDER — LORAZEPAM 1 MG PO TABS
1.0000 mg | ORAL_TABLET | Freq: Once | ORAL | Status: AC
  Administered 2014-06-29: 1 mg via ORAL
  Filled 2014-06-29: qty 1

## 2014-06-29 MED ORDER — MECLIZINE HCL 25 MG PO TABS: 25.0000 mg | ORAL_TABLET | Freq: Three times a day (TID) | ORAL | Status: DC | PRN

## 2014-06-29 MED ORDER — MECLIZINE HCL 25 MG PO TABS
25.0000 mg | ORAL_TABLET | Freq: Once | ORAL | Status: AC
  Administered 2014-06-29: 25 mg via ORAL
  Filled 2014-06-29: qty 1

## 2014-06-29 MED ORDER — LORAZEPAM 1 MG PO TABS: 1.0000 mg | ORAL_TABLET | Freq: Three times a day (TID) | ORAL | Status: DC | PRN

## 2014-06-29 MED ORDER — ONDANSETRON 4 MG PO TBDP
8.0000 mg | ORAL_TABLET | Freq: Once | ORAL | Status: AC
  Administered 2014-06-29: 8 mg via ORAL
  Filled 2014-06-29: qty 2

## 2014-06-29 NOTE — ED Notes (Signed)
Pt is here with chest pain that radiates up to her neck and into her head since last night.  No neuro deficits noted.  Pt states when she moves her head since to side she gets lightheaded

## 2014-06-29 NOTE — ED Provider Notes (Signed)
CSN: [id number]     Arrival date & time 06/29/14  1332 History   First MD Initiated Contact with Patient 06/29/14 1649     Chief Complaint  Patient presents with  . Chest Pain  . Headache     (Consider location/radiation/quality/duration/timing/severity/associated sxs/prior Treatment) The history is provided by the patient and a relative. A language interpreter was used.  69 year old female comes in with onset last night of vague discomfort in the neck and chest, and lightheadedness when she turns her head. There is associated nausea, but no vomiting. No ear pain, hearing loss, or tinnitus. No dyspnea or diaphoresis.  Past Medical History  Diagnosis Date  . Diabetes mellitus   . Hypertension   . Asthma   . Diabetic neuropathy   . Seasonal allergies   . Obesity    History reviewed. No pertinent past surgical history. No family history on file. History  Substance Use Topics  . Smoking status: Former Research scientist (life sciences)  . Smokeless tobacco: Never Used  . Alcohol Use: No   OB History    No data available     Review of Systems  All other systems reviewed and are negative.     Allergies  Other  Home Medications   Prior to Admission medications   Medication Sig Start Date End Date Taking? Authorizing Provider  albuterol (PROVENTIL HFA;VENTOLIN HFA) 108 (90 BASE) MCG/ACT inhaler Inhale 2 puffs into the lungs every 6 (six) hours as needed for wheezing or shortness of breath.    Historical Provider, MD  azithromycin (ZITHROMAX Z-PAK) 250 MG tablet 2 po day one, then 1 daily x 4 days 12/05/13   Elnora Morrison, MD  beclomethasone (QVAR) 80 MCG/ACT inhaler Inhale 2-4 puffs into the lungs every 4 (four) hours as needed (breathing). As needed for asthma. 01/22/12   Virgel Manifold, MD  cetirizine (ZYRTEC) 10 MG tablet Take 0.5 tablets (5 mg total) by mouth daily. 10/27/11   Charlann Lange, PA-C  gabapentin (NEURONTIN) 300 MG capsule Take 1 capsule (300 mg total) by mouth 3 (three) times  daily. Patient taking differently: Take 300 mg by mouth daily.  01/22/12   Virgel Manifold, MD  glipiZIDE (GLUCOTROL XL) 10 MG 24 hr tablet Take 1 tablet (10 mg total) by mouth every morning. 04/15/13   Domenic Polite, MD  glucose blood test strip 1 each by Other route 3 (three) times daily as needed for other (blood sugar.). Use as instructed    Historical Provider, MD  Insulin Glargine (LANTUS SOLOSTAR) 100 UNIT/ML Solostar Pen Inject 45 Units into the skin every morning. 04/15/13   Domenic Polite, MD  Lancets MISC 1 each by Does not apply route 3 (three) times daily as needed (test blood sugar.).    Historical Provider, MD  lisinopril (PRINIVIL,ZESTRIL) 10 MG tablet Take 1 tablet (10 mg total) by mouth at bedtime. 01/22/12   Virgel Manifold, MD  metFORMIN (GLUCOPHAGE) 1000 MG tablet Take 1 tablet (1,000 mg total) by mouth 2 (two) times daily with a meal. 12/03/12   Lacretia Leigh, MD  UNABLE TO FIND Glucometer 04/15/13   Domenic Polite, MD  UNABLE TO FIND Insulin test strips Patient not taking: Reported on 01/17/2014 04/15/13   Domenic Polite, MD  UNABLE TO FIND Lancets Patient not taking: Reported on 01/17/2014 04/15/13   Domenic Polite, MD   BP 126/104 mmHg  Pulse 88  Temp(Src) 98.5 F (36.9 C) (Oral)  Resp 20  SpO2 99% Physical Exam  Nursing note and vitals reviewed.  69 year old female, resting comfortably and in no acute distress. Vital signs are significant for diastolic hypertension. Oxygen saturation is 99%, which is normal. Head is normocephalic and atraumatic. PERRLA, EOMI. Oropharynx is clear. There is no nystagmus. Neck is nontender and supple without adenopathy or JVD. There are no carotid bruits. Back is nontender and there is no CVA tenderness. Lungs are clear without rales, wheezes, or rhonchi. Chest is nontender. Heart has regular rate and rhythm without murmur. Abdomen is soft, flat, nontender without masses or hepatosplenomegaly and peristalsis is normoactive. Extremities have  no cyanosis or edema, full range of motion is present. Skin is warm and dry without rash. Neurologic: Mental status is normal, cranial nerves are intact, there are no motor or sensory deficits. Dizziness and nausea are reproduced by passive head movement.  ED Course  Procedures (including critical care time) Labs Review Results for orders placed or performed during the hospital encounter of 06/29/14  CBC  Result Value Ref Range   WBC 10.1 4.0 - 10.5 K/uL   RBC 4.36 3.87 - 5.11 MIL/uL   Hemoglobin 12.9 12.0 - 15.0 g/dL   HCT 37.4 36.0 - 46.0 %   MCV 85.8 78.0 - 100.0 fL   MCH 29.6 26.0 - 34.0 pg   MCHC 34.5 30.0 - 36.0 g/dL   RDW 11.9 11.5 - 15.5 %   Platelets 232 150 - 400 K/uL  Basic metabolic panel  Result Value Ref Range   Sodium 140 135 - 145 mmol/L   Potassium 4.3 3.5 - 5.1 mmol/L   Chloride 102 101 - 111 mmol/L   CO2 29 22 - 32 mmol/L   Glucose, Bld 129 (H) 65 - 99 mg/dL   BUN 26 (H) 6 - 20 mg/dL   Creatinine, Ser 1.28 (H) 0.44 - 1.00 mg/dL   Calcium 9.0 8.9 - 10.3 mg/dL   GFR calc non Af Amer 41 (L) >60 mL/min   GFR calc Af Amer 48 (L) >60 mL/min   Anion gap 9 5 - 15  I-stat troponin, ED  (not at [facility], [facility])  Result Value Ref Range   Troponin i, poc 0.00 0.00 - 0.08 ng/mL   Comment 3           Imaging Review Dg Chest 2 View  06/29/2014   CLINICAL DATA:  Right-sided chest pain and shortness of breath for 1 week  EXAM: CHEST  2 VIEW  COMPARISON:  January 17, 2014  FINDINGS: There is no edema or consolidation. The heart size and pulmonary vascularity are normal. No adenopathy. There is slight anterior wedging of the L1 vertebral body. No pneumothorax.  IMPRESSION: No edema or consolidation.   Electronically Signed   By: Lowella Grip III M.D.   On: 06/29/2014 14:23   Mr Brain Wo Contrast  06/29/2014   CLINICAL DATA:  69 year old female with chest pain and dizziness. Initial encounter.  EXAM: MRI HEAD WITHOUT CONTRAST  TECHNIQUE: Multiplanar, multiecho pulse sequences  of the brain and surrounding structures were obtained without intravenous contrast.  COMPARISON:  Head CT without contrast 01/17/2014 and earlier.  FINDINGS: Incidental cavum septum pellucidum. No ventriculomegaly. No restricted diffusion to suggest acute infarction. No midline shift, mass effect, evidence of mass lesion, extra-axial collection or acute intracranial hemorrhage. Cervicomedullary junction and pituitary are within normal limits. Major intracranial vascular flow voids are within normal limits. Minimal to mild for age nonspecific cerebral white matter T2 and FLAIR hyperintensity. No cortical encephalomalacia or chronic cerebral blood products. Deep gray  matter nuclei, brainstem and cerebellum are within normal limits for age. Visible internal auditory structures appear normal.  Mastoids are clear. Trace paranasal sinus mucosal thickening. Orbits soft tissues appear normal. Negative scalp soft tissues. Normal bone marrow signal. Negative visualized cervical spine.  IMPRESSION: 1.  No acute intracranial abnormality. 2. Mild for age nonspecific cerebral white matter signal changes, most commonly due to chronic small vessel disease.   Electronically Signed   By: Genevie Ann M.D.   On: 06/29/2014 19:59     EKG Interpretation   Date/Time:  Thursday June 29 2014 13:41:14 EDT Ventricular Rate:  92 PR Interval:  138 QRS Duration: 78 QT Interval:  370 QTC Calculation: 457 R Axis:   59 Text Interpretation:  Normal sinus rhythm Cannot rule out Anterior infarct  , age undetermined Abnormal ECG When compared with ECG of 12/05/2013, No  significant change was found Confirmed by [facility]  MD, Frandy Basnett ([id number]) on  06/29/2014 4:48:55 PM      MDM   Final diagnoses:  Dizziness  Peripheral vertigo, unspecified laterality  Renal insufficiency    Dizziness most consistent with peripheral vertigo. ECG is unchanged from baseline, troponin is normal, electrolytes are normal, and renal function is at baseline. No  red flags to suggest more serious pathology. She will be given a therapeutic trial of meclizine.  Following above-noted treatment, patient still was feeling about the same. She will be sent for MRI to rule out posterior circulation stroke. She is also given a dose of lorazepam.  Symptoms are much better after above noted treatment. MRI is unremarkable. She is discharged with prescriptions for lorazepam and meclizine is to follow-up with her PCP in 4 days.  Delora Fuel, MD [id number]

## 2014-06-29 NOTE — ED Notes (Signed)
Patient transported to MRI 

## 2014-06-29 NOTE — Discharge Instructions (Signed)
Return if you are getting worse.  Vertigo Vertigo means you feel like you or your surroundings are moving when they are not. Vertigo can be dangerous if it occurs when you are at work, driving, or performing difficult activities.  CAUSES  Vertigo occurs when there is a conflict of signals sent to your brain from the visual and sensory systems in your body. There are many different causes of vertigo, including:  Infections, especially in the inner ear.  A bad reaction to a drug or misuse of alcohol and medicines.  Withdrawal from drugs or alcohol.  Rapidly changing positions, such as lying down or rolling over in bed.  A migraine headache.  Decreased blood flow to the brain.  Increased pressure in the brain from a head injury, infection, tumor, or bleeding. SYMPTOMS  You may feel as though the world is spinning around or you are falling to the ground. Because your balance is upset, vertigo can cause nausea and vomiting. You may have involuntary eye movements (nystagmus). DIAGNOSIS  Vertigo is usually diagnosed by physical exam. If the cause of your vertigo is unknown, your caregiver may perform imaging tests, such as an MRI scan (magnetic resonance imaging). TREATMENT  Most cases of vertigo resolve on their own, without treatment. Depending on the cause, your caregiver may prescribe certain medicines. If your vertigo is related to body position issues, your caregiver may recommend movements or procedures to correct the problem. In rare cases, if your vertigo is caused by certain inner ear problems, you may need surgery. HOME CARE INSTRUCTIONS   Follow your caregiver's instructions.  Avoid driving.  Avoid operating heavy machinery.  Avoid performing any tasks that would be dangerous to you or others during a vertigo episode.  Tell your caregiver if you notice that certain medicines seem to be causing your vertigo. Some of the medicines used to treat vertigo episodes can actually  make them worse in some people. SEEK IMMEDIATE MEDICAL CARE IF:   Your medicines do not relieve your vertigo or are making it worse.  You develop problems with talking, walking, weakness, or using your arms, hands, or legs.  You develop severe headaches.  Your nausea or vomiting continues or gets worse.  You develop visual changes.  A family member notices behavioral changes.  Your condition gets worse. MAKE SURE YOU:  Understand these instructions.  Will watch your condition.  Will get help right away if you are not doing well or get worse. Document Released: 10/02/2004 Document Revised: 03/17/2011 Document Reviewed: 07/11/2010 [facility] Patient Information 2015 [place], [place]. This information is not intended to replace advice given to you by your health care provider. Make sure you discuss any questions you have with your health care provider.  Meclizine tablets or capsules What is this medicine? MECLIZINE (MEK li zeen) is an antihistamine. It is used to prevent nausea, vomiting, or dizziness caused by motion sickness. It is also used to prevent and treat vertigo (extreme dizziness or a feeling that you or your surroundings are tilting or spinning around). This medicine may be used for other purposes; ask your health care provider or pharmacist if you have questions. COMMON BRAND NAME(S): Antivert, Dramamine Less Drowsy, Medivert, Meni-D What should I tell my health care provider before I take this medicine? They need to know if you have any of these conditions: -asthma -glaucoma -prostate trouble -stomach problems -urinary problems -an unusual or allergic reaction to meclizine, other medicines, foods, dyes, or preservatives -pregnant or trying to get pregnant -  breast-feeding How should I use this medicine? Take this medicine by mouth with a glass of water. Follow the directions on the prescription label. If you are using this medicine to prevent motion sickness, take the  dose at least 1 hour before travel. If it upsets your stomach, take it with food or milk. Take your doses at regular intervals. Do not take your medicine more often than directed. Talk to your pediatrician regarding the use of this medicine in children. Special care may be needed. Overdosage: If you think you have taken too much of this medicine contact a poison control center or emergency room at once. NOTE: This medicine is only for you. Do not share this medicine with others. What if I miss a dose? If you miss a dose, take it as soon as you can. If it is almost time for your next dose, take only that dose. Do not take double or extra doses. What may interact with this medicine? -barbiturate medicines for inducing sleep or treating seizures -digoxin -medicines for anxiety or sleeping problems, like alprazolam, diazepam or temazepam -medicines for hay fever and other allergies -medicines for mental depression -medicines for movement abnormalities as in Parkinson's disease, or for stomach problems -medicines for pain -medicines that relax muscles This list may not describe all possible interactions. Give your health care provider a list of all the medicines, herbs, non-prescription drugs, or dietary supplements you use. Also tell them if you smoke, drink alcohol, or use illegal drugs. Some items may interact with your medicine. What should I watch for while using this medicine? If you are taking this medicine on a regular schedule, visit your doctor or health care professional for regular checks on your progress. You may get dizzy, drowsy or have blurred vision. Do not drive, use machinery, or do anything that needs mental alertness until you know how this medicine affects you. Do not stand or sit up quickly, especially if you are an older patient. This reduces the risk of dizzy or fainting spells. Alcohol can increase possible dizziness. Avoid alcoholic drinks. Your mouth may get dry. Chewing  sugarless gum or sucking hard candy, and drinking plenty of water may help. Contact your doctor if the problem does not go away or is severe. This medicine may cause dry eyes and blurred vision. If you wear contact lenses you may feel some discomfort. Lubricating drops may help. See your eye doctor if the problem does not go away or is severe. What side effects may I notice from receiving this medicine? Side effects that you should report to your doctor or health care professional as soon as possible: -fainting spells -fast or irregular heartbeat Side effects that usually do not require medical attention (report to your doctor or health care professional if they continue or are bothersome): -constipation -difficulty passing urine -difficulty sleeping -headache -stomach upset This list may not describe all possible side effects. Call your doctor for medical advice about side effects. You may report side effects to FDA at 1-800-FDA-1088. Where should I keep my medicine? Keep out of the reach of children. Store at room temperature between 15 and 30 degrees C (59 and 86 degrees F). Keep container tightly closed. Throw away any unused medicine after the expiration date. NOTE: This sheet is a summary. It may not cover all possible information. If you have questions about this medicine, talk to your doctor, pharmacist, or health care provider.  2015, Elsevier/Gold Standard. (2007-07-01 10:35:36)  Lorazepam tablets What is this medicine?  LORAZEPAM (lor A ze pam) is a benzodiazepine. It is used to treat anxiety. This medicine may be used for other purposes; ask your health care provider or pharmacist if you have questions. COMMON BRAND NAME(S): Ativan What should I tell my health care provider before I take this medicine? They need to know if you have any of these conditions: -alcohol or drug abuse problem -bipolar disorder, depression, psychosis or other mental health  condition -glaucoma -kidney or liver disease -lung disease or breathing difficulties -myasthenia gravis -Parkinson's disease -seizures or a history of seizures -suicidal thoughts -an unusual or allergic reaction to lorazepam, other benzodiazepines, foods, dyes, or preservatives -pregnant or trying to get pregnant -breast-feeding How should I use this medicine? Take this medicine by mouth with a glass of water. Follow the directions on the prescription label. If it upsets your stomach, take it with food or milk. Take your medicine at regular intervals. Do not take it more often than directed. Do not stop taking except on the advice of your doctor or health care professional. Talk to your pediatrician regarding the use of this medicine in children. Special care may be needed. Overdosage: If you think you have taken too much of this medicine contact a poison control center or emergency room at once. NOTE: This medicine is only for you. Do not share this medicine with others. What if I miss a dose? If you miss a dose, take it as soon as you can. If it is almost time for your next dose, take only that dose. Do not take double or extra doses. What may interact with this medicine? -barbiturate medicines for inducing sleep or treating seizures, like phenobarbital -clozapine -medicines for depression, mental problems or psychiatric disturbances -medicines for sleep -phenytoin -probenecid -theophylline -valproic acid This list may not describe all possible interactions. Give your health care provider a list of all the medicines, herbs, non-prescription drugs, or dietary supplements you use. Also tell them if you smoke, drink alcohol, or use illegal drugs. Some items may interact with your medicine. What should I watch for while using this medicine? Visit your doctor or health care professional for regular checks on your progress. Your body may become dependent on this medicine, ask your doctor or  health care professional if you still need to take it. However, if you have been taking this medicine regularly for some time, do not suddenly stop taking it. You must gradually reduce the dose or you may get severe side effects. Ask your doctor or health care professional for advice before increasing or decreasing the dose. Even after you stop taking this medicine it can still affect your body for several days. You may get drowsy or dizzy. Do not drive, use machinery, or do anything that needs mental alertness until you know how this medicine affects you. To reduce the risk of dizzy and fainting spells, do not stand or sit up quickly, especially if you are an older patient. Alcohol may increase dizziness and drowsiness. Avoid alcoholic drinks. Do not treat yourself for coughs, colds or allergies without asking your doctor or health care professional for advice. Some ingredients can increase possible side effects. What side effects may I notice from receiving this medicine? Side effects that you should report to your doctor or health care professional as soon as possible: -changes in vision -confusion -depression -mood changes, excitability or aggressive behavior -movement difficulty, staggering or jerky movements -muscle cramps -restlessness -weakness or tiredness Side effects that usually do not require medical  attention (report to your doctor or health care professional if they continue or are bothersome): -constipation or diarrhea -difficulty sleeping, nightmares -dizziness, drowsiness -headache -nausea, vomiting This list may not describe all possible side effects. Call your doctor for medical advice about side effects. You may report side effects to FDA at 1-800-FDA-1088. Where should I keep my medicine? Keep out of the reach of children. This medicine can be abused. Keep your medicine in a safe place to protect it from theft. Do not share this medicine with anyone. Selling or giving away  this medicine is dangerous and against the law. Store at room temperature between 20 and 25 degrees C (68 and 77 degrees F). Protect from light. Keep container tightly closed. Throw away any unused medicine after the expiration date. NOTE: This sheet is a summary. It may not cover all possible information. If you have questions about this medicine, talk to your doctor, pharmacist, or health care provider.  2015, [employer]vier/[employer] Standard. (2007-06-25 14:58:20)

## 2014-09-23 ENCOUNTER — Encounter (HOSPITAL_COMMUNITY): Payer: Self-pay | Admitting: Emergency Medicine

## 2014-09-23 ENCOUNTER — Emergency Department (HOSPITAL_COMMUNITY): Payer: Medicare Other

## 2014-09-23 ENCOUNTER — Inpatient Hospital Stay (HOSPITAL_COMMUNITY)
Admission: EM | Admit: 2014-09-23 | Discharge: 2014-09-25 | DRG: 872 | Disposition: A | Discharge status: Home Health | Payer: Medicare Other | Attending: Internal Medicine | Admitting: Internal Medicine

## 2014-09-23 DIAGNOSIS — R1084 Generalized abdominal pain: Secondary | ICD-10-CM | POA: Diagnosis present

## 2014-09-23 DIAGNOSIS — R112 Nausea with vomiting, unspecified: Secondary | ICD-10-CM | POA: Diagnosis present

## 2014-09-23 DIAGNOSIS — E1165 Type 2 diabetes mellitus with hyperglycemia: Secondary | ICD-10-CM | POA: Diagnosis present

## 2014-09-23 DIAGNOSIS — Z79899 Other long term (current) drug therapy: Secondary | ICD-10-CM | POA: Diagnosis not present

## 2014-09-23 DIAGNOSIS — J45909 Unspecified asthma, uncomplicated: Secondary | ICD-10-CM | POA: Diagnosis present

## 2014-09-23 DIAGNOSIS — K5792 Diverticulitis of intestine, part unspecified, without perforation or abscess without bleeding: Secondary | ICD-10-CM | POA: Diagnosis not present

## 2014-09-23 DIAGNOSIS — E1169 Type 2 diabetes mellitus with other specified complication: Secondary | ICD-10-CM | POA: Diagnosis present

## 2014-09-23 DIAGNOSIS — K5732 Diverticulitis of large intestine without perforation or abscess without bleeding: Secondary | ICD-10-CM | POA: Diagnosis present

## 2014-09-23 DIAGNOSIS — Z794 Long term (current) use of insulin: Secondary | ICD-10-CM | POA: Diagnosis not present

## 2014-09-23 DIAGNOSIS — E872 Acidosis: Secondary | ICD-10-CM | POA: Diagnosis present

## 2014-09-23 DIAGNOSIS — I129 Hypertensive chronic kidney disease with stage 1 through stage 4 chronic kidney disease, or unspecified chronic kidney disease: Secondary | ICD-10-CM | POA: Diagnosis present

## 2014-09-23 DIAGNOSIS — Z791 Long term (current) use of non-steroidal anti-inflammatories (NSAID): Secondary | ICD-10-CM

## 2014-09-23 DIAGNOSIS — Z8249 Family history of ischemic heart disease and other diseases of the circulatory system: Secondary | ICD-10-CM

## 2014-09-23 DIAGNOSIS — E1129 Type 2 diabetes mellitus with other diabetic kidney complication: Secondary | ICD-10-CM

## 2014-09-23 DIAGNOSIS — E669 Obesity, unspecified: Secondary | ICD-10-CM | POA: Diagnosis present

## 2014-09-23 DIAGNOSIS — R109 Unspecified abdominal pain: Secondary | ICD-10-CM | POA: Diagnosis not present

## 2014-09-23 DIAGNOSIS — N183 Chronic kidney disease, stage 3 unspecified: Secondary | ICD-10-CM | POA: Diagnosis present

## 2014-09-23 DIAGNOSIS — N390 Urinary tract infection, site not specified: Secondary | ICD-10-CM | POA: Diagnosis present

## 2014-09-23 DIAGNOSIS — I10 Essential (primary) hypertension: Secondary | ICD-10-CM

## 2014-09-23 DIAGNOSIS — D72829 Elevated white blood cell count, unspecified: Secondary | ICD-10-CM | POA: Diagnosis present

## 2014-09-23 DIAGNOSIS — R0682 Tachypnea, not elsewhere classified: Secondary | ICD-10-CM | POA: Diagnosis present

## 2014-09-23 DIAGNOSIS — Z87891 Personal history of nicotine dependence: Secondary | ICD-10-CM | POA: Diagnosis not present

## 2014-09-23 DIAGNOSIS — A419 Sepsis, unspecified organism: Secondary | ICD-10-CM | POA: Diagnosis present

## 2014-09-23 DIAGNOSIS — E1122 Type 2 diabetes mellitus with diabetic chronic kidney disease: Secondary | ICD-10-CM | POA: Diagnosis present

## 2014-09-23 DIAGNOSIS — E1149 Type 2 diabetes mellitus with other diabetic neurological complication: Secondary | ICD-10-CM

## 2014-09-23 DIAGNOSIS — E1140 Type 2 diabetes mellitus with diabetic neuropathy, unspecified: Secondary | ICD-10-CM | POA: Diagnosis present

## 2014-09-23 DIAGNOSIS — R197 Diarrhea, unspecified: Secondary | ICD-10-CM | POA: Diagnosis present

## 2014-09-23 DIAGNOSIS — E119 Type 2 diabetes mellitus without complications: Secondary | ICD-10-CM | POA: Diagnosis present

## 2014-09-23 DIAGNOSIS — E785 Hyperlipidemia, unspecified: Secondary | ICD-10-CM | POA: Diagnosis present

## 2014-09-23 DIAGNOSIS — N1831 Chronic kidney disease, stage 3a: Secondary | ICD-10-CM | POA: Diagnosis present

## 2014-09-23 LAB — URINALYSIS, ROUTINE W REFLEX MICROSCOPIC
Bilirubin Urine: NEGATIVE
HGB URINE DIPSTICK: NEGATIVE
KETONES UR: NEGATIVE mg/dL
LEUKOCYTES UA: NEGATIVE
Nitrite: NEGATIVE
PH: 6.5 (ref 5.0–8.0)
Protein, ur: NEGATIVE mg/dL
Specific Gravity, Urine: 1.02 (ref 1.005–1.030)
Urobilinogen, UA: 1 mg/dL (ref 0.0–1.0)

## 2014-09-23 LAB — COMPREHENSIVE METABOLIC PANEL
ALBUMIN: 4.3 g/dL (ref 3.5–5.0)
ALK PHOS: 71 U/L (ref 38–126)
ALT: 14 U/L (ref 14–54)
ALT: 12 U/L — ABNORMAL LOW (ref 14–54)
ANION GAP: 9 (ref 5–15)
AST: 22 U/L (ref 15–41)
AST: 21 U/L (ref 15–41)
Albumin: 3.7 g/dL (ref 3.5–5.0)
Alkaline Phosphatase: 66 U/L (ref 38–126)
Anion gap: 9 (ref 5–15)
BILIRUBIN TOTAL: 1.1 mg/dL (ref 0.3–1.2)
BUN: 36 mg/dL — AB (ref 6–20)
BUN: 28 mg/dL — AB (ref 6–20)
CALCIUM: 8.5 mg/dL — AB (ref 8.9–10.3)
CHLORIDE: 100 mmol/L — AB (ref 101–111)
CO2: 28 mmol/L (ref 22–32)
CO2: 26 mmol/L (ref 22–32)
CREATININE: 1.56 mg/dL — AB (ref 0.44–1.00)
Calcium: 9.2 mg/dL (ref 8.9–10.3)
Chloride: 104 mmol/L (ref 101–111)
Creatinine, Ser: 1.36 mg/dL — ABNORMAL HIGH (ref 0.44–1.00)
GFR calc Af Amer: 45 mL/min — ABNORMAL LOW (ref >60)
GFR calc non Af Amer: 33 mL/min — ABNORMAL LOW (ref >60)
GFR, EST AFRICAN AMERICAN: 38 mL/min — AB (ref >60)
GFR, EST NON AFRICAN AMERICAN: 38 mL/min — AB (ref >60)
GLUCOSE: 393 mg/dL — AB (ref 65–99)
Glucose, Bld: 158 mg/dL — ABNORMAL HIGH (ref 65–99)
POTASSIUM: 4.3 mmol/L (ref 3.5–5.1)
Potassium: 4.2 mmol/L (ref 3.5–5.1)
SODIUM: 137 mmol/L (ref 135–145)
Sodium: 139 mmol/L (ref 135–145)
TOTAL PROTEIN: 7.7 g/dL (ref 6.5–8.1)
Total Bilirubin: 0.9 mg/dL (ref 0.3–1.2)
Total Protein: 8.4 g/dL — ABNORMAL HIGH (ref 6.5–8.1)

## 2014-09-23 LAB — LIPASE, BLOOD: LIPASE: 31 U/L (ref 22–51)

## 2014-09-23 LAB — CBC WITH DIFFERENTIAL/PLATELET
BASOS ABS: 0 10*3/uL (ref 0.0–0.1)
Basophils Relative: 0 %
EOS ABS: 0.6 10*3/uL (ref 0.0–0.7)
Eosinophils Relative: 4 %
HEMATOCRIT: 31.8 % — AB (ref 36.0–46.0)
Hemoglobin: 11 g/dL — ABNORMAL LOW (ref 12.0–15.0)
LYMPHS ABS: 3.3 10*3/uL (ref 0.7–4.0)
LYMPHS PCT: 24 %
MCH: 30.5 pg (ref 26.0–34.0)
MCHC: 34.6 g/dL (ref 30.0–36.0)
MCV: 88.1 fL (ref 78.0–100.0)
MONOS PCT: 6 %
Monocytes Absolute: 0.8 10*3/uL (ref 0.1–1.0)
Neutro Abs: 9.2 10*3/uL — ABNORMAL HIGH (ref 1.7–7.7)
Neutrophils Relative %: 66 %
PLATELETS: 192 10*3/uL (ref 150–400)
RBC: 3.61 MIL/uL — AB (ref 3.87–5.11)
RDW: 12.5 % (ref 11.5–15.5)
WBC: 13.9 10*3/uL — AB (ref 4.0–10.5)

## 2014-09-23 LAB — CBC
HCT: 35.4 % — ABNORMAL LOW (ref 36.0–46.0)
Hemoglobin: 12.3 g/dL (ref 12.0–15.0)
MCH: 30.3 pg (ref 26.0–34.0)
MCHC: 34.7 g/dL (ref 30.0–36.0)
MCV: 87.2 fL (ref 78.0–100.0)
PLATELETS: 214 10*3/uL (ref 150–400)
RBC: 4.06 MIL/uL (ref 3.87–5.11)
RDW: 12.4 % (ref 11.5–15.5)
WBC: 13.8 10*3/uL — ABNORMAL HIGH (ref 4.0–10.5)

## 2014-09-23 LAB — I-STAT CG4 LACTIC ACID, ED
LACTIC ACID, VENOUS: 0.81 mmol/L (ref 0.5–2.0)
Lactic Acid, Venous: 2.26 mmol/L (ref 0.5–2.0)

## 2014-09-23 LAB — LACTIC ACID, PLASMA
LACTIC ACID, VENOUS: 1.4 mmol/L (ref 0.5–2.0)
Lactic Acid, Venous: 2.2 mmol/L (ref 0.5–2.0)

## 2014-09-23 LAB — URINE MICROSCOPIC-ADD ON

## 2014-09-23 LAB — PROTIME-INR
INR: 1.03 (ref 0.00–1.49)
PROTHROMBIN TIME: 13.7 s (ref 11.6–15.2)

## 2014-09-23 LAB — GLUCOSE, CAPILLARY: Glucose-Capillary: 129 mg/dL — ABNORMAL HIGH (ref 65–99)

## 2014-09-23 LAB — APTT: APTT: 31 s (ref 24–37)

## 2014-09-23 LAB — BRAIN NATRIURETIC PEPTIDE: B NATRIURETIC PEPTIDE 5: 18.1 pg/mL (ref 0.0–100.0)

## 2014-09-23 LAB — PROCALCITONIN

## 2014-09-23 LAB — TSH: TSH: 0.857 u[IU]/mL (ref 0.350–4.500)

## 2014-09-23 LAB — MAGNESIUM: MAGNESIUM: 1 mg/dL — AB (ref 1.7–2.4)

## 2014-09-23 LAB — PHOSPHORUS: Phosphorus: 2.8 mg/dL (ref 2.5–4.6)

## 2014-09-23 MED ORDER — INSULIN ASPART 100 UNIT/ML ~~LOC~~ SOLN: 0.0000 [IU] | Freq: Every day | SUBCUTANEOUS | Status: DC

## 2014-09-23 MED ORDER — PIPERACILLIN-TAZOBACTAM 3.375 G IVPB
3.3750 g | Freq: Three times a day (TID) | INTRAVENOUS | Status: DC
  Administered 2014-09-23 – 2014-09-25 (×5): 3.375 g via INTRAVENOUS
  Filled 2014-09-23 (×5): qty 50

## 2014-09-23 MED ORDER — MORPHINE SULFATE (PF) 2 MG/ML IV SOLN
1.0000 mg | INTRAVENOUS | Status: DC | PRN
Start: 2014-09-23 — End: 2014-09-25
  Administered 2014-09-23 – 2014-09-25 (×6): 1 mg via INTRAVENOUS
  Filled 2014-09-23 (×6): qty 1

## 2014-09-23 MED ORDER — SODIUM CHLORIDE 0.9 % IV BOLUS (SEPSIS)
1000.0000 mL | Freq: Once | INTRAVENOUS | Status: AC
  Administered 2014-09-23: 1000 mL via INTRAVENOUS

## 2014-09-23 MED ORDER — ONDANSETRON HCL 4 MG PO TABS: 4.0000 mg | ORAL_TABLET | Freq: Four times a day (QID) | ORAL | Status: DC | PRN

## 2014-09-23 MED ORDER — ACETAMINOPHEN 325 MG PO TABS
650.0000 mg | ORAL_TABLET | Freq: Four times a day (QID) | ORAL | Status: DC | PRN
  Filled 2014-09-23: qty 2

## 2014-09-23 MED ORDER — SODIUM CHLORIDE 0.9 % IV SOLN
INTRAVENOUS | Status: DC
  Administered 2014-09-23 – 2014-09-24 (×3): via INTRAVENOUS

## 2014-09-23 MED ORDER — GLIPIZIDE ER 10 MG PO TB24
10.0000 mg | ORAL_TABLET | Freq: Two times a day (BID) | ORAL | Status: DC
  Administered 2014-09-24 – 2014-09-25 (×3): 10 mg via ORAL
  Filled 2014-09-23 (×3): qty 1

## 2014-09-23 MED ORDER — INSULIN GLARGINE 100 UNIT/ML ~~LOC~~ SOLN
20.0000 [IU] | Freq: Every day | SUBCUTANEOUS | Status: DC
  Administered 2014-09-24: 20 [IU] via SUBCUTANEOUS
  Filled 2014-09-23 (×3): qty 0.2

## 2014-09-23 MED ORDER — CETYLPYRIDINIUM CHLORIDE 0.05 % MT LIQD
7.0000 mL | Freq: Two times a day (BID) | OROMUCOSAL | Status: DC
  Administered 2014-09-23 – 2014-09-25 (×4): 7 mL via OROMUCOSAL

## 2014-09-23 MED ORDER — BECLOMETHASONE DIPROPIONATE 80 MCG/ACT IN AERS: 2.0000 | INHALATION_SPRAY | RESPIRATORY_TRACT | Status: DC | PRN

## 2014-09-23 MED ORDER — GABAPENTIN 300 MG PO CAPS
300.0000 mg | ORAL_CAPSULE | Freq: Three times a day (TID) | ORAL | Status: DC
  Administered 2014-09-24 – 2014-09-25 (×4): 300 mg via ORAL
  Filled 2014-09-23 (×5): qty 1

## 2014-09-23 MED ORDER — INSULIN GLARGINE 100 UNIT/ML SOLOSTAR PEN: 20.0000 [IU] | PEN_INJECTOR | Freq: Every morning | SUBCUTANEOUS | Status: DC

## 2014-09-23 MED ORDER — PIPERACILLIN-TAZOBACTAM 3.375 G IVPB 30 MIN
3.3750 g | INTRAVENOUS | Status: AC
  Administered 2014-09-23: 3.375 g via INTRAVENOUS

## 2014-09-23 MED ORDER — ONDANSETRON HCL 4 MG/2ML IJ SOLN: 4.0000 mg | Freq: Four times a day (QID) | INTRAMUSCULAR | Status: DC | PRN

## 2014-09-23 MED ORDER — VITAMIN D 1000 UNITS PO TABS
2000.0000 [IU] | ORAL_TABLET | Freq: Every day | ORAL | Status: DC
  Administered 2014-09-24 – 2014-09-25 (×2): 2000 [IU] via ORAL
  Filled 2014-09-23 (×2): qty 2

## 2014-09-23 MED ORDER — ACETAMINOPHEN 650 MG RE SUPP: 650.0000 mg | Freq: Four times a day (QID) | RECTAL | Status: DC | PRN

## 2014-09-23 MED ORDER — SODIUM CHLORIDE 0.9 % IJ SOLN
3.0000 mL | Freq: Two times a day (BID) | INTRAMUSCULAR | Status: DC
  Administered 2014-09-23 – 2014-09-24 (×2): 3 mL via INTRAVENOUS

## 2014-09-23 MED ORDER — PIPERACILLIN-TAZOBACTAM 3.375 G IVPB 30 MIN
3.3750 g | Freq: Once | INTRAVENOUS | Status: DC
Start: 2014-09-23 — End: 2014-09-23

## 2014-09-23 MED ORDER — HYDRALAZINE HCL 20 MG/ML IJ SOLN
5.0000 mg | Freq: Four times a day (QID) | INTRAMUSCULAR | Status: DC | PRN
Start: 2014-09-23 — End: 2014-09-25

## 2014-09-23 MED ORDER — IOHEXOL 300 MG/ML  SOLN
100.0000 mL | Freq: Once | INTRAMUSCULAR | Status: AC | PRN
  Administered 2014-09-23: 80 mL via INTRAVENOUS

## 2014-09-23 MED ORDER — IOHEXOL 300 MG/ML  SOLN
50.0000 mL | Freq: Once | INTRAMUSCULAR | Status: DC | PRN
  Administered 2014-09-23: 50 mL via ORAL
  Filled 2014-09-23: qty 50

## 2014-09-23 MED ORDER — INSULIN ASPART 100 UNIT/ML ~~LOC~~ SOLN
0.0000 [IU] | Freq: Three times a day (TID) | SUBCUTANEOUS | Status: DC
  Administered 2014-09-23: 2 [IU] via SUBCUTANEOUS
  Administered 2014-09-24: 3 [IU] via SUBCUTANEOUS
  Administered 2014-09-24: 2 [IU] via SUBCUTANEOUS
  Administered 2014-09-24: 3 [IU] via SUBCUTANEOUS

## 2014-09-23 MED ORDER — BUDESONIDE 0.25 MG/2ML IN SUSP
0.2500 mg | Freq: Two times a day (BID) | RESPIRATORY_TRACT | Status: DC
  Administered 2014-09-23 – 2014-09-25 (×4): 0.25 mg via RESPIRATORY_TRACT
  Filled 2014-09-23 (×4): qty 2

## 2014-09-23 NOTE — ED Notes (Signed)
Patient not in room

## 2014-09-23 NOTE — ED Notes (Signed)
Dr Thomasene Lot was notified of patient's lab result.

## 2014-09-23 NOTE — ED Notes (Signed)
CBG 350 per EMS, pt sts she is SOB, worse when laying flat

## 2014-09-23 NOTE — ED Provider Notes (Signed)
CSN: [id number]     Arrival date & time 09/23/14  1421 History   First MD Initiated Contact with Patient 09/23/14 1454     Chief Complaint  Patient presents with  . Abdominal Pain     (Consider location/radiation/quality/duration/timing/severity/associated sxs/prior Treatment) HPI   Has a [place] female presenting today with 3 days of diarrhea. Patient has history of diabetes hypertension and obesity. 3 days ago patient ate some fish from a [place] and immediately started with diarrhea. Patient's son is translating for me in the room. Patient has been unable to keep adequate fluids or food down here and she is nauseated as well as diarrhea. Denies any fever at home. Patient diabetic and her sugars up and running around 300.    Past Medical History  Diagnosis Date  . Diabetes mellitus   . Hypertension   . Asthma   . Diabetic neuropathy   . Seasonal allergies   . Obesity    History reviewed. No pertinent past surgical history. No family history on file. Social History  Substance Use Topics  . Smoking status: Former Research scientist (life sciences)  . Smokeless tobacco: Never Used  . Alcohol Use: No   OB History    No data available     Review of Systems  Constitutional: Negative for activity change and fatigue.  HENT: Negative for congestion and drooling.   Eyes: Negative for discharge.  Respiratory: Negative for cough and chest tightness.   Cardiovascular: Negative for chest pain.  Gastrointestinal: Positive for abdominal pain and diarrhea. Negative for abdominal distention.  Genitourinary: Negative for dysuria and difficulty urinating.  Musculoskeletal: Negative for joint swelling.  Skin: Negative for rash.  Allergic/Immunologic: Negative for immunocompromised state.  Psychiatric/Behavioral: Negative for behavioral problems and agitation.      Allergies  Other  Home Medications   Prior to Admission medications   Medication Sig Start Date End Date Taking?  Authorizing Aylissa Heinemann  albuterol (PROVENTIL HFA;VENTOLIN HFA) 108 (90 BASE) MCG/ACT inhaler Inhale 2 puffs into the lungs every 6 (six) hours as needed for wheezing or shortness of breath.   Yes Historical Halea Lieb, MD  beclomethasone (QVAR) 80 MCG/ACT inhaler Inhale 2-4 puffs into the lungs every 4 (four) hours as needed (SOB).  01/22/12  Yes Virgel Manifold, MD  cetirizine (ZYRTEC) 10 MG tablet Take 0.5 tablets (5 mg total) by mouth daily. 10/27/11  Yes Charlann Lange, PA-C  Cholecalciferol (VITAMIN D) 2000 UNITS CAPS Take 2,000 Units by mouth daily.   Yes Historical Tamar Miano, MD  gabapentin (NEURONTIN) 300 MG capsule Take 1 capsule (300 mg total) by mouth 3 (three) times daily. 01/22/12  Yes Virgel Manifold, MD  glipiZIDE (GLUCOTROL XL) 10 MG 24 hr tablet Take 1 tablet (10 mg total) by mouth every morning. Patient taking differently: Take 10 mg by mouth 2 (two) times daily.  04/15/13  Yes Domenic Polite, MD  ibuprofen (ADVIL,MOTRIN) 200 MG tablet Take 200 mg by mouth every 6 (six) hours as needed for headache, mild pain or moderate pain.   Yes Historical Felita Bump, MD  Insulin Glargine (LANTUS SOLOSTAR) 100 UNIT/ML Solostar Pen Inject 45 Units into the skin every morning. Patient taking differently: Inject 20 Units into the skin every morning.  04/15/13  Yes Domenic Polite, MD  lisinopril (PRINIVIL,ZESTRIL) 10 MG tablet Take 1 tablet (10 mg total) by mouth at bedtime. 01/22/12  Yes Virgel Manifold, MD  metFORMIN (GLUCOPHAGE) 1000 MG tablet Take 1 tablet (1,000 mg total) by mouth 2 (two) times daily with a meal.  12/03/12  Yes Lacretia Leigh, MD  azithromycin (ZITHROMAX Z-PAK) 250 MG tablet 2 po day one, then 1 daily x 4 days Patient not taking: Reported on 06/29/2014 12/05/13   Elnora Morrison, MD  LORazepam (ATIVAN) 1 MG tablet Take 1 tablet (1 mg total) by mouth 3 (three) times daily as needed for anxiety (or dizziness). Patient not taking: Reported on 09/23/2014 [id number]   Delora Fuel, MD  meclizine (ANTIVERT) 25  MG tablet Take 1 tablet (25 mg total) by mouth 3 (three) times daily as needed for dizziness. Patient not taking: Reported on 09/23/2014 [id number]   Delora Fuel, MD   BP 0000000 mmHg  Pulse 119  Temp(Src) 99.3 F (37.4 C) (Oral)  Resp 18  Ht 5\' 1"  (1.549 m)  Wt 140 lb (63.504 kg)  BMI 26.47 kg/m2  SpO2 98% Physical Exam  Constitutional: She is oriented to person, place, and time. She appears well-developed and well-nourished.  HENT:  Head: Normocephalic and atraumatic.  Dry mucous membranes  Eyes: Conjunctivae are normal. Right eye exhibits no discharge.  Neck: Neck supple.  Cardiovascular: Regular rhythm and normal heart sounds.   No murmur heard.  tachycardic.  Pulmonary/Chest: Effort normal and breath sounds normal. She has no wheezes. She has no rales.  Abdominal: Soft. She exhibits no distension.  Patient has tenderness to left lower quadrant.  Musculoskeletal: Normal range of motion. She exhibits no edema.  Neurological: She is oriented to person, place, and time. No cranial nerve deficit.  Skin: Skin is warm and dry. No rash noted. She is not diaphoretic.  Psychiatric: She has a normal mood and affect. Her behavior is normal.  Nursing note and vitals reviewed.   ED Course  Procedures (including critical care time) Labs Review Labs Reviewed  CBC - Abnormal; Notable for the following:    WBC 13.8 (*)    HCT 35.4 (*)    All other components within normal limits  I-STAT CG4 LACTIC ACID, ED - Abnormal; Notable for the following:    Lactic Acid, Venous 2.26 (*)    All other components within normal limits  CULTURE, BLOOD (ROUTINE X 2)  CULTURE, BLOOD (ROUTINE X 2)  URINE CULTURE  LIPASE, BLOOD  COMPREHENSIVE METABOLIC PANEL  URINALYSIS, ROUTINE W REFLEX MICROSCOPIC (NOT AT [facility])  BRAIN NATRIURETIC PEPTIDE  I-STAT TROPOININ, ED  I-STAT CG4 LACTIC ACID, ED    Imaging Review Dg Chest Portable 1 View  09/23/2014   CLINICAL DATA:  Right lower quadrant abdominal pain,  which radiates to the left abdomen and back. Shortness of breath for 2 days.  EXAM: PORTABLE CHEST - 1 VIEW  COMPARISON:  06/29/2014  FINDINGS: Cardiomediastinal silhouette is normal for portable technique. Mediastinal contours appear intact.  There is no evidence of focal airspace consolidation, pleural effusion or pneumothorax. Lungs are hypoinflated. Exaggerated crowding of the interstitial markings is noted in the left lower lobe.  Osseous structures are without acute abnormality. Soft tissues are grossly normal.  IMPRESSION: Hypoinflation of the lungs.  Focal crowding of the interstitial markings in the left lower lobe suggestive of atelectasis. Airspace disease is felt less likely, however follow-up with PA and lateral radiograph of the chest may be considered.   Electronically Signed   By: Fidela Salisbury M.D.   On: 09/23/2014 15:10   I have personally reviewed and evaluated these images and lab results as part of my medical decision-making.   EKG Interpretation None      MDM   Final diagnoses:  None  Patient is a 69 year old female presenting with left lower quadrant pain and diarrhea for the last 3 days. Patient's has diabetes and her sugars up have been outt of control. I think this is likely diverticulitis. We will get a CAT scan because patient does not have prior history. Patient does have an elevated lactate we will treat with antibiotics and fluid.   Pateitn with diverticulitis on CT.  Lactate normalizing.  Will admit.   Courteney Julio Alm, MD 09/23/14 2320

## 2014-09-23 NOTE — Progress Notes (Signed)
ANTIBIOTIC CONSULT NOTE - INITIAL  Pharmacy Consult for Zosyn Indication: rule out sepsis  Allergies  Allergen Reactions  . Other Other (See Comments)    Chlorox.  Cannot tolerate smell. And Chicken    Patient Measurements: Height: 5\' 1"  (154.9 cm) Weight: 140 lb (63.504 kg) IBW/kg (Calculated) : 47.8  Vital Signs: Temp: 99.3 F (37.4 C) (09/17 1426) Temp Source: Oral (09/17 1426) BP: 143/69 mmHg (09/17 1426) Pulse Rate: 119 (09/17 1433) Intake/Output from previous day:   Intake/Output from this shift:    Labs:  Recent Labs  09/23/14 1440  WBC 13.8*  HGB 12.3  PLT 214  CREATININE 1.56*   Estimated Creatinine Clearance: 28.7 mL/min (by C-G formula based on Cr of 1.56). No results for input(s): VANCOTROUGH, VANCOPEAK, VANCORANDOM, GENTTROUGH, GENTPEAK, GENTRANDOM, TOBRATROUGH, TOBRAPEAK, TOBRARND, AMIKACINPEAK, AMIKACINTROU, AMIKACIN in the last 72 hours.   Microbiology: No results found for this or any previous visit (from the past 720 hour(s)).  Medical History: Past Medical History  Diagnosis Date  . Diabetes mellitus   . Hypertension   . Asthma   . Diabetic neuropathy   . Seasonal allergies   . Obesity     Medications:  Anti-infectives    Start     Dose/Rate Route Frequency Ordered Stop   09/23/14 2300  piperacillin-tazobactam (ZOSYN) IVPB 3.375 g     3.375 g 12.5 mL/hr over 240 Minutes Intravenous 3 times per day 09/23/14 1514     09/23/14 1530  piperacillin-tazobactam (ZOSYN) IVPB 3.375 g     3.375 g 100 mL/hr over 30 Minutes Intravenous STAT 09/23/14 1514 09/24/14 1530     Assessment: 69yo F from home via [facility], c/o left sided abd pain which radiates to left flank, diarrhea, and SOB. WBCs and lactate are elevated. Pharmacy is asked to dose Zosyn for sepsis. SCr elevated, CrCl 29CG, 38N.  Goal of Therapy:  Appropriate antibiotic dosing for renal function; eradication of infection  Plan:  Zosyn 3.375g IV over 30 minutes then, Zosyn 3.375g IV  Q8H infused over 4hrs. Follow up renal fxn, culture results, and clinical course.  Romeo Rabon, PharmD, pager [phone]. 09/23/2014,4:19 PM.

## 2014-09-23 NOTE — ED Notes (Signed)
From home via [facility], c/o left sided abd pain which radiates to left flank, diarrhea, no vomiting, A/O X4, ambulatory, NAD

## 2014-09-23 NOTE — ED Notes (Signed)
Bed: [id number] Expected date:  Expected time:  Means of arrival:  Comments: Ems-69 yo

## 2014-09-23 NOTE — ED Notes (Signed)
Spoke with Janett Billow, Agricultural consultant. Patient can be transported @ 1850.

## 2014-09-23 NOTE — ED Notes (Signed)
Patient transported to CT 

## 2014-09-23 NOTE — H&P (Addendum)
[facility] History and Physical  Nichole Munoz OIL:[id number] DOB: [birth year] DOA: 09/23/2014  Referring physician: ER physician: Dr. Zenovia Jarred PCP: [place]  Chief Complaint: abdominal pain   HPI:  69 year old female with past medical history of hypertension, CKD stage 3, diabetes who presented to [facility] ED with diffuse abdominal pain with associated nausea, occasional vomiting and diarrhea for past few days prior to this admission. Pt reported pain was cramp like, about 6/10 in intensity and present at rest and with movement. No associated fevers. No blood in emesis or stool. No chest pain, shortness of breath or palpitations. No lightheadedness or loss of consciousness. No flank pain. No reports of weight loss but she did have very poor po intake since the symptoms started.   In ED, BP was 132/81, HR was 119, RR 28, oxygen saturation was 97% on room air. Blood work was notable for 13.8, creatinine was 1.56, lactic acid was 2.26. UA did not reveal leukocytes or nitrites. CT abdomen demonstrated multiple diverticula in the descending colon and sigmoid colon with stranding of pericolonic fat and thickening of diverticular wall findings concerning for acute diverticulitis or focal colitis. She was started on broad spectrum antibiotics.    Assessment & Plan    Principal Problem: Sepsis secondary to acute diverticulitis / Leukocytosis/ Diarrhea - Sepsis criteria met on admission with tachycardia, tachypnea, leukocytosis and evidence of acute diverticulitis on CT abdomen. Also, lactic acid elevated at 2.26. - Sepsis work up initiated - Follow up blood culture results - Follow up procalcitonin level - Send stool for C.diff - Continue IV fluids - Keep NPO for first 24 hours - Continue pain management efforts   Active Problems: CKD (chronic kidney disease) stage 3, GFR 30-59 ml/min - Baseline creatinine is 1.7 (about 8 months ago) - Creatinine on this  admission 1.56 so around and better than baseline - Will continue to monitor - Temporarily put lisinopril and metformin on hold while being treated for acute infection   DM (diabetes mellitus), type 2, uncontrolled, with renal complications - O1V in 6153 was 10.5 indicating poor glycemic control - No recent A1c on file - Resume pt insulin regimen as per prior to this admission - Resuemd glipizide - Added SSI  Diabetic neuropathy - Continue gabapentin     Benign essential HTN - Held lisinopril while pt on abx for sepsis - Will resume on discharge - For now will use hydralazine 5 mg IV every 8 hours as needed for BP above 150/90      DVT prophylaxis:  - SCD's bilaterally     Radiological Exams on Admission: Ct Abdomen Pelvis W Contrast 09/23/2014   1. Multiple diverticula are noted descending colon and sigmoid colon. There is inflammatory process in descending colon axial image 48 stranding of pericolonic fat and thickening of diverticular wall. Findings are consistent with acute diverticulitis or focal colitis. No diverticular abscess. No definite evidence of perforation. 2. There is abnormal thickening of urinary bladder wall. Cystitis is suspected. 3. Normal appendix.  No pericecal inflammation. 4. No hydronephrosis or hydroureter. There is right nonobstructive nephrolithiasis. 5. No small bowel obstruction.   Electronically Signed   By: Lahoma Crocker M.D.   On: 09/23/2014 17:18   Dg Chest Portable 1 View 09/23/2014   Hypoinflation of the lungs.  Focal crowding of the interstitial markings in the left lower lobe suggestive of atelectasis. Airspace disease is felt less likely, however follow-up with PA and lateral radiograph of  the chest may be considered.   Electronically Signed   By: Fidela Salisbury M.D.   On: 09/23/2014 15:10    EKG: I have personally reviewed EKG. EKG shows sinus tachycardia  Code Status: Full Family Communication: Plan of care discussed with the patient and her  daughter at the bedside  Disposition Plan: Admit for further evaluation  Leisa Lenz, MD  [facility] Pager [phone]  Time spent in minutes: 75 minutes  Review of Systems:  Constitutional: Negative for fever, chills and malaise/fatigue. Negative for diaphoresis.  HENT: Negative for hearing loss, ear pain, nosebleeds, congestion, sore throat, neck pain, tinnitus and ear discharge.   Eyes: Negative for blurred vision, double vision, photophobia, pain, discharge and redness.  Respiratory: Negative for cough, hemoptysis, sputum production, shortness of breath, wheezing and stridor.   Cardiovascular: Negative for chest pain, palpitations, orthopnea, claudication and leg swelling.  Gastrointestinal: per HPI.  Genitourinary: Negative for dysuria, urgency, frequency, hematuria and flank pain.  Musculoskeletal: Negative for myalgias, back pain, joint pain and falls.  Skin: Negative for itching and rash.  Neurological: Negative for dizziness and weakness. Negative for tingling, tremors, sensory change, speech change, focal weakness, loss of consciousness and headaches.  Endo/Heme/Allergies: Negative for environmental allergies and polydipsia. Does not bruise/bleed easily.  Psychiatric/Behavioral: Negative for suicidal ideas. The patient is not nervous/anxious.      Past Medical History  Diagnosis Date  . Diabetes mellitus   . Hypertension   . Asthma   . Diabetic neuropathy   . Seasonal allergies   . Obesity    History reviewed. No pertinent past surgical history. Social History:  reports that she has quit smoking. She has never used smokeless tobacco. She reports that she does not drink alcohol or use illicit drugs.  Allergies  Allergen Reactions  . Other Other (See Comments)    Chlorox.  Cannot tolerate smell. And Chicken    Family History: Hypertension in parents    Prior to Admission medications   Medication Sig Start Date End Date Taking? Authorizing Provider   albuterol (PROVENTIL HFA;VENTOLIN HFA) 108 (90 BASE) MCG/ACT inhaler Inhale 2 puffs into the lungs every 6 (six) hours as needed for wheezing or shortness of breath.   Yes Historical Provider, MD  beclomethasone (QVAR) 80 MCG/ACT inhaler Inhale 2-4 puffs into the lungs every 4 (four) hours as needed (SOB).  01/22/12  Yes Virgel Manifold, MD  cetirizine (ZYRTEC) 10 MG tablet Take 0.5 tablets (5 mg total) by mouth daily. 10/27/11  Yes Charlann Lange, PA-C  Cholecalciferol (VITAMIN D) 2000 UNITS CAPS Take 2,000 Units by mouth daily.   Yes Historical Provider, MD  gabapentin (NEURONTIN) 300 MG capsule Take 1 capsule (300 mg total) by mouth 3 (three) times daily. 01/22/12  Yes Virgel Manifold, MD  glipiZIDE (GLUCOTROL XL) 10 MG 24 hr tablet Take 1 tablet (10 mg total) by mouth every morning. Patient taking differently: Take 10 mg by mouth 2 (two) times daily.  04/15/13  Yes Domenic Polite, MD  ibuprofen (ADVIL,MOTRIN) 200 MG tablet Take 200 mg by mouth every 6 (six) hours as needed for headache, mild pain or moderate pain.   Yes Historical Provider, MD  Insulin Glargine (LANTUS SOLOSTAR) 100 UNIT/ML Solostar Pen Inject 45 Units into the skin every morning. Patient taking differently: Inject 20 Units into the skin every morning.  04/15/13  Yes Domenic Polite, MD  lisinopril (PRINIVIL,ZESTRIL) 10 MG tablet Take 1 tablet (10 mg total) by mouth at bedtime. 01/22/12  Yes Virgel Manifold, MD  metFORMIN (GLUCOPHAGE) 1000 MG tablet Take 1 tablet (1,000 mg total) by mouth 2 (two) times daily with a meal. 12/03/12  Yes Lacretia Leigh, MD  azithromycin (ZITHROMAX Z-PAK) 250 MG tablet 2 po day one, then 1 daily x 4 days Patient not taking: Reported on 06/29/2014 12/05/13   Elnora Morrison, MD  LORazepam (ATIVAN) 1 MG tablet Take 1 tablet (1 mg total) by mouth 3 (three) times daily as needed for anxiety (or dizziness). Patient not taking: Reported on 09/23/2014 09/15/00   Delora Fuel, MD  meclizine (ANTIVERT) 25 MG tablet Take 1  tablet (25 mg total) by mouth 3 (three) times daily as needed for dizziness. Patient not taking: Reported on 09/23/2014 07/03/52   Delora Fuel, MD   Physical Exam: Filed Vitals:   09/23/14 1754 09/23/14 1858 09/23/14 1915 09/23/14 1917  BP: 162/94 132/81 149/74   Pulse:  109 104 98  Temp:  99 F (37.2 C)    TempSrc:  Oral    Resp: _0 Height:      Weight:      SpO2:  97% 100%     Physical Exam  Constitutional: Appears well-developed and well-nourished. No distress.  HENT: Normocephalic. No tonsillar erythema or exudates Eyes: Conjunctivae are normal. No scleral icterus.  Neck: Normal ROM. Neck supple. No JVD. No tracheal deviation. No thyromegaly.  CVS: RRR, S1/S2 appreciated  Pulmonary: Effort and breath sounds normal, no stridor, rhonchi, wheezes, rales.  Abdominal: Soft. BS +,  no distension, tenderness, rebound or guarding.  Musculoskeletal: Normal range of motion. No edema and no tenderness.  Lymphadenopathy: No lymphadenopathy noted, cervical, inguinal. Neuro: Alert. Normal reflexes, muscle tone coordination. No focal neurologic deficits. Skin: Skin is warm and dry. No rash noted.  No erythema. No pallor.  Psychiatric: Normal mood and affect. Behavior, judgment, thought content normal.   Labs on Admission:  Basic Metabolic Panel:  Recent Labs Lab 09/23/14 1440  NA 137  K 4.2  CL 100*  CO2 28  GLUCOSE 393*  BUN 36*  CREATININE 1.56*  CALCIUM 9.2   Liver Function Tests:  Recent Labs Lab 09/23/14 1440  AST 22  ALT 14  ALKPHOS 71  BILITOT 0.9  PROT 8.4*  ALBUMIN 4.3    Recent Labs Lab 09/23/14 1440  LIPASE 31   No results for input(s): AMMONIA in the last 168 hours. CBC:  Recent Labs Lab 09/23/14 1440  WBC 13.8*  HGB 12.3  HCT 35.4*  MCV 87.2  PLT 214   Cardiac Enzymes: No results for input(s): CKTOTAL, CKMB, CKMBINDEX, TROPONINI in the last 168 hours. BNP: Invalid input(s): POCBNP CBG: No results for input(s): GLUCAP in the  last 168 hours.  If 7PM-7AM, please contact night-coverage [email or web address] Password [email or web address] 09/23/2014, 8:02 PM

## 2014-09-23 NOTE — Progress Notes (Signed)
/  CRITICAL VALUE ALERT  Critical value received:  Lactic acid 2.2  Date of notification:  09/23/14  Time of notification:  2156  Critical value read back:Yes.    Nurse who received alert:  Virgina Norfolk  MD notified (1st page):  [place]  Time of first page:  2257  MD notified (2nd page):  Time of second page:  Responding MD:  Rachelle Hora  Time MD responded:  2206

## 2014-09-24 LAB — CBC
HEMATOCRIT: 32.2 % — AB (ref 36.0–46.0)
HEMOGLOBIN: 10.9 g/dL — AB (ref 12.0–15.0)
MCH: 29.5 pg (ref 26.0–34.0)
MCHC: 33.9 g/dL (ref 30.0–36.0)
MCV: 87.3 fL (ref 78.0–100.0)
Platelets: 178 10*3/uL (ref 150–400)
RBC: 3.69 MIL/uL — ABNORMAL LOW (ref 3.87–5.11)
RDW: 12.6 % (ref 11.5–15.5)
WBC: 13.8 10*3/uL — AB (ref 4.0–10.5)

## 2014-09-24 LAB — COMPREHENSIVE METABOLIC PANEL
ALBUMIN: 3.5 g/dL (ref 3.5–5.0)
ALT: 13 U/L — ABNORMAL LOW (ref 14–54)
ANION GAP: 9 (ref 5–15)
AST: 20 U/L (ref 15–41)
Alkaline Phosphatase: 62 U/L (ref 38–126)
BUN: 25 mg/dL — AB (ref 6–20)
CHLORIDE: 108 mmol/L (ref 101–111)
CO2: 24 mmol/L (ref 22–32)
Calcium: 8.1 mg/dL — ABNORMAL LOW (ref 8.9–10.3)
Creatinine, Ser: 1.24 mg/dL — ABNORMAL HIGH (ref 0.44–1.00)
GFR calc Af Amer: 50 mL/min — ABNORMAL LOW (ref >60)
GFR calc non Af Amer: 43 mL/min — ABNORMAL LOW (ref >60)
GLUCOSE: 123 mg/dL — AB (ref 65–99)
POTASSIUM: 4 mmol/L (ref 3.5–5.1)
SODIUM: 141 mmol/L (ref 135–145)
Total Bilirubin: 1 mg/dL (ref 0.3–1.2)
Total Protein: 7.4 g/dL (ref 6.5–8.1)

## 2014-09-24 LAB — GLUCOSE, CAPILLARY
GLUCOSE-CAPILLARY: 177 mg/dL — AB (ref 65–99)
Glucose-Capillary: 160 mg/dL — ABNORMAL HIGH (ref 65–99)
Glucose-Capillary: 145 mg/dL — ABNORMAL HIGH (ref 65–99)
Glucose-Capillary: 104 mg/dL — ABNORMAL HIGH (ref 65–99)

## 2014-09-24 LAB — LACTIC ACID, PLASMA: Lactic Acid, Venous: 1.7 mmol/L (ref 0.5–2.0)

## 2014-09-24 MED ORDER — MAGNESIUM SULFATE 2 GM/50ML IV SOLN
2.0000 g | Freq: Once | INTRAVENOUS | Status: AC
  Administered 2014-09-24: 2 g via INTRAVENOUS
  Filled 2014-09-24: qty 50

## 2014-09-24 NOTE — Progress Notes (Signed)
Patient ID: Nichole Munoz, female   DOB: 05-31-1945, 69 y.o.   MRN: [id number] [facility] PROGRESS NOTE  Nichole Munoz [id number] DOB: 02-06-1945 DOA: 09/23/2014 PCP: [place]  Brief narrative:    69 year old female with past medical history of hypertension, CKD stage 3, diabetes who presented to [facility] ED with diffuse abdominal pain with associated nausea, occasional vomiting and diarrhea for past few days prior to this admission. She was found to have acute diverticulitis as seen on abdomen CT on admission.  Anticipated discharge: 09/25/2014.  Assessment/Plan:     Principal Problem: Sepsis secondary to acute diverticulitis / Leukocytosis/ Diarrhea - Sepsis criteria met on admission with tachycardia, tachypnea, leukocytosis, lactic acidosis.  Source of infection - acute diverticulitis as seen on CT abdomen.  - Sepsis work up started on admission. Procalcitonin was WNL - Blood cultures so far negative - Urine culture re-incubated for better growth - Currently NPO but will advance to clear liquids today - Continue IV fluids for hydration - Continue antiemetics and analgesia PRN for supportive care  - Of note, stool for C.diff not sent since diarrhea spontaneously resolved.   Active Problems: CKD (chronic kidney disease) stage 3, GFR 30-59 ml/min - Baseline creatinine is 1.7 (about 8 months ago) - Creatinine on this admission 1.56 so around baseline  Hypomagnesemia - Supplemented - Possibly from GI losses   DM (diabetes mellitus), type 2, uncontrolled, with renal complications - H4K in 3524 was 10.5 indicating poor glycemic control - A1c pending on this admission - Continue glipizide - Continue Lantus 20 units at bedtime - Continue SSI  Diabetic neuropathy - Continue gabapentin  - Stable    Benign essential HTN - Held lisinopril until sepsis etiology resolves  - Use hydralazine 5 mg IV every 8 hours as needed for BP above 150/90   DVT  Prophylaxis  - SCD's bilaterally    Code Status: Full.  Family Communication:  plan of care discussed with the patient Disposition Plan: Home 9/19.  IV access:  Peripheral IV  Procedures and diagnostic studies:    Ct Abdomen Pelvis W Contrast 09/23/2014  1. Multiple diverticula are noted descending colon and sigmoid colon. There is inflammatory process in descending colon axial image 48 stranding of pericolonic fat and thickening of diverticular wall. Findings are consistent with acute diverticulitis or focal colitis. No diverticular abscess. No definite evidence of perforation. 2. There is abnormal thickening of urinary bladder wall. Cystitis is suspected. 3. Normal appendix.  No pericecal inflammation. 4. No hydronephrosis or hydroureter. There is right nonobstructive nephrolithiasis. 5. No small bowel obstruction.   Electronically Signed   By: Lahoma Crocker M.D.   On: 09/23/2014 17:18   Dg Chest Portable 1 View 09/23/2014    Hypoinflation of the lungs.  Focal crowding of the interstitial markings in the left lower lobe suggestive of atelectasis. Airspace disease is felt less likely, however follow-up with PA and lateral radiograph of the chest may be considered.   Electronically Signed   By: Fidela Salisbury M.D.   On: 09/23/2014 15:10    Medical Consultants:  None   Other Consultants:  None   IAnti-Infectives:   Zosyn 09/23/2014 -->   Leisa Lenz, MD  [facility] Pager [phone]  Time spent in minutes: 25 minutes  If 7PM-7AM, please contact night-coverage [email or web address] Password [facility] 09/24/2014, 12:24 PM   LOS: 1 day    HPI/Subjective: No acute overnight events. Patient reports feeling better, no diarrhea.   Objective:  Filed Vitals:   09/23/14 2143 09/24/14 0454 09/24/14 0900 09/24/14 1028  BP: 144/80 124/84 118/65   Pulse: 98 89 88   Temp: 98.9 F (37.2 C) 99.6 F (37.6 C) 99 F (37.2 C)   TempSrc: Oral Oral Oral   Resp: 22 22    Height: $Remove'5\' 6"'[id number]$  (1.676 m)      Weight: 65 kg (143 lb 4.8 oz) 65 kg (143 lb 4.8 oz)    SpO2: 98% 98% 99% 100%    Intake/Output Summary (Last 24 hours) at 09/24/14 1224 Last data filed at 09/24/14 0900  Gross per 24 hour  Intake 1021.25 ml  Output   1200 ml  Net -178.75 ml    Exam:   General:  Pt is alert, follows commands appropriately, not in acute distress  Cardiovascular: Regular rate and rhythm, S1/S2, no murmurs  Respiratory: Clear to auscultation bilaterally, no wheezing, no crackles, no rhonchi  Abdomen: Soft, non tender, non distended, bowel sounds present  Extremities: No edema, pulses DP and PT palpable bilaterally  Neuro: Grossly nonfocal  Data Reviewed: Basic Metabolic Panel:  Recent Labs Lab 09/23/14 1440 09/23/14 2046 09/24/14 0115  NA 137 139 141  K 4.2 4.3 4.0  CL 100* 104 108  CO2 $Re'28 26 24  'IHr$ GLUCOSE 393* 158* 123*  BUN 36* 28* 25*  CREATININE 1.56* 1.36* 1.24*  CALCIUM 9.2 8.5* 8.1*  MG  --  1.0*  --   PHOS  --  2.8  --    Liver Function Tests:  Recent Labs Lab 09/23/14 1440 09/23/14 2046 09/24/14 0115  AST $Re'22 21 20  'Gjt$ ALT 14 12* 13*  ALKPHOS 71 66 62  BILITOT 0.9 1.1 1.0  PROT 8.4* 7.7 7.4  ALBUMIN 4.3 3.7 3.5    Recent Labs Lab 09/23/14 1440  LIPASE 31   No results for input(s): AMMONIA in the last 168 hours. CBC:  Recent Labs Lab 09/23/14 1440 09/23/14 2046 09/24/14 0115  WBC 13.8* 13.9* 13.8*  NEUTROABS  --  9.2*  --   HGB 12.3 11.0* 10.9*  HCT 35.4* 31.8* 32.2*  MCV 87.2 88.1 87.3  PLT 214 192 178   Cardiac Enzymes: No results for input(s): CKTOTAL, CKMB, CKMBINDEX, TROPONINI in the last 168 hours. BNP: Invalid input(s): POCBNP CBG:  Recent Labs Lab 09/23/14 2010 09/24/14 0810 09/24/14 1104  GLUCAP 129* 160* 177*    Recent Results (from the past 240 hour(s))  Urine culture     Status: None (Preliminary result)   Collection Time: 09/23/14  2:59 PM  Result Value Ref Range Status   Specimen Description URINE, RANDOM  Final    Special Requests NONE  Final   Culture   Final    CULTURE REINCUBATED FOR BETTER GROWTH Performed at  [facility]    Report Status PENDING  Incomplete     Scheduled Meds: . antiseptic oral rinse  7 mL Mouth Rinse BID  . budesonide (PULMICORT) nebulizer solution  0.25 mg Nebulization BID  . cholecalciferol  2,000 Units Oral Daily  . gabapentin  300 mg Oral TID  . glipiZIDE  10 mg Oral BID WC  . insulin aspart  0-15 Units Subcutaneous TID WC  . insulin aspart  0-5 Units Subcutaneous QHS  . insulin glargine  20 Units Subcutaneous QHS  . piperacillin-tazobactam (ZOSYN)  IV  3.375 g Intravenous 3 times per day  . sodium chloride  3 mL Intravenous Q12H   Continuous Infusions: . sodium chloride 75 mL/hr at 09/23/14 2349

## 2014-09-24 NOTE — Progress Notes (Signed)
Utilization Review Completed.Dowell, Nichole Munoz  

## 2014-09-24 NOTE — Care Management Note (Signed)
Case Management Note  Patient Details  Name: Nichole Munoz MRN: [id number] Date of Birth: 1945-03-23  Subjective/Objective:      acute diverticulitis               Action/Plan: NCM spoke to pt and son, [name] [place] # [phone] was translator. Pt is requesting a four prong cane for home. PT eval/tx pending. Waiting final recommendations for home. Orders needed for cane.   Expected Discharge Date:                  Expected Discharge Plan:  Home/Self Care  In-House Referral:     Discharge planning Services  CM Consult   Status of Service:  In process, will continue to follow  Medicare Important Message Given:    Date Medicare IM Given:    Medicare IM give by:    Date Additional Medicare IM Given:    Additional Medicare Important Message give by:     If discussed at [place] of Stay Meetings, dates discussed:    Additional Comments:  Erenest Rasher, RN 09/24/2014, 5:35 PM

## 2014-09-25 LAB — BASIC METABOLIC PANEL
ANION GAP: 9 (ref 5–15)
BUN: 14 mg/dL (ref 6–20)
CO2: 26 mmol/L (ref 22–32)
Calcium: 8.4 mg/dL — ABNORMAL LOW (ref 8.9–10.3)
Chloride: 107 mmol/L (ref 101–111)
Creatinine, Ser: 1.37 mg/dL — ABNORMAL HIGH (ref 0.44–1.00)
GFR calc non Af Amer: 38 mL/min — ABNORMAL LOW (ref >60)
GFR, EST AFRICAN AMERICAN: 44 mL/min — AB (ref >60)
GLUCOSE: 102 mg/dL — AB (ref 65–99)
POTASSIUM: 3.8 mmol/L (ref 3.5–5.1)
Sodium: 142 mmol/L (ref 135–145)

## 2014-09-25 LAB — CBC
HEMATOCRIT: 31.7 % — AB (ref 36.0–46.0)
HEMOGLOBIN: 10.7 g/dL — AB (ref 12.0–15.0)
MCH: 29.6 pg (ref 26.0–34.0)
MCHC: 33.8 g/dL (ref 30.0–36.0)
MCV: 87.8 fL (ref 78.0–100.0)
Platelets: 183 10*3/uL (ref 150–400)
RBC: 3.61 MIL/uL — AB (ref 3.87–5.11)
RDW: 12.7 % (ref 11.5–15.5)
WBC: 10 10*3/uL (ref 4.0–10.5)

## 2014-09-25 LAB — URINE CULTURE: CULTURE: NO GROWTH

## 2014-09-25 LAB — GLUCOSE, CAPILLARY
GLUCOSE-CAPILLARY: 228 mg/dL — AB (ref 65–99)
Glucose-Capillary: 108 mg/dL — ABNORMAL HIGH (ref 65–99)

## 2014-09-25 LAB — HEMOGLOBIN A1C
HEMOGLOBIN A1C: 8 % — AB (ref 4.8–5.6)
Mean Plasma Glucose: 183 mg/dL

## 2014-09-25 LAB — MAGNESIUM: MAGNESIUM: 1.2 mg/dL — AB (ref 1.7–2.4)

## 2014-09-25 MED ORDER — MAGNESIUM SULFATE 2 GM/50ML IV SOLN
2.0000 g | Freq: Once | INTRAVENOUS | Status: AC
  Administered 2014-09-25: 2 g via INTRAVENOUS
  Filled 2014-09-25: qty 50

## 2014-09-25 MED ORDER — AMLODIPINE BESYLATE 5 MG PO TABS: 5.0000 mg | ORAL_TABLET | Freq: Every day | ORAL | Status: DC

## 2014-09-25 MED ORDER — ACETAMINOPHEN 325 MG PO TABS: 650.0000 mg | ORAL_TABLET | Freq: Four times a day (QID) | ORAL | Status: DC | PRN

## 2014-09-25 MED ORDER — METRONIDAZOLE 500 MG PO TABS: 500.0000 mg | ORAL_TABLET | Freq: Three times a day (TID) | ORAL | Status: DC

## 2014-09-25 MED ORDER — INSULIN GLARGINE 100 UNIT/ML SOLOSTAR PEN: 20.0000 [IU] | PEN_INJECTOR | Freq: Every morning | SUBCUTANEOUS | Status: DC

## 2014-09-25 MED ORDER — CIPROFLOXACIN HCL 500 MG PO TABS: 500.0000 mg | ORAL_TABLET | Freq: Two times a day (BID) | ORAL | Status: DC

## 2014-09-25 NOTE — Progress Notes (Signed)
09/25/14   1515  Reviewed discharge with patient and her son. Pt son verbalized understanding of discharge instructions.  Copy of discharge instructions given to patient's son.  Pt son was in a hurry for discharge so patient did not received cane prior to discharge. Pt's son states he will go out a buy her one.

## 2014-09-25 NOTE — Discharge Instructions (Signed)
Diverticulitis Diverticulitis is when small pockets that have formed in your colon (large intestine) become infected or swollen. HOME CARE  Follow your doctor's instructions.  Follow a special diet if told by your doctor.  When you feel better, your doctor may tell you to change your diet. You may be told to eat a lot of fiber. Fruits and vegetables are good sources of fiber. Fiber makes it easier to poop (have bowel movements).  Take supplements or probiotics as told by your doctor.  Only take medicines as told by your doctor.  Keep all follow-up visits with your doctor. GET HELP IF:  Your pain does not get better.  You have a hard time eating food.  You are not pooping like normal. GET HELP RIGHT AWAY IF:  Your pain gets worse.  Your problems do not get better.  Your problems suddenly get worse.  You have a fever.  You keep throwing up (vomiting).  You have bloody or black, tarry poop (stool). MAKE SURE YOU:   Understand these instructions.  Will watch your condition.  Will get help right away if you are not doing well or get worse. Document Released: 06/11/2007 Document Revised: 12/28/2012 Document Reviewed: 11/17/2012 [facility] Patient Information 2015 [place], [place]. This information is not intended to replace advice given to you by your health care provider. Make sure you discuss any questions you have with your health care provider. Ciprofloxacin tablets What is this medicine? CIPROFLOXACIN (sip roe FLOX a sin) is a quinolone antibiotic. It is used to treat certain kinds of bacterial infections. It will not work for colds, flu, or other viral infections. This medicine may be used for other purposes; ask your health care provider or pharmacist if you have questions. COMMON BRAND NAME(S): Cipro What should I tell my health care provider before I take this medicine? They need to know if you have any of these conditions: -bone problems -cerebral disease -joint  problems -irregular heartbeat -kidney disease -liver disease -myasthenia gravis -seizure disorder -tendon problems -an unusual or allergic reaction to ciprofloxacin, other antibiotics or medicines, foods, dyes, or preservatives -pregnant or trying to get pregnant -breast-feeding How should I use this medicine? Take this medicine by mouth with a glass of water. Follow the directions on the prescription label. Take your medicine at regular intervals. Do not take your medicine more often than directed. Take all of your medicine as directed even if you think your are better. Do not skip doses or stop your medicine early. You can take this medicine with food or on an empty stomach. It can be taken with a meal that contains dairy or calcium, but do not take it alone with a dairy product, like milk or yogurt or calcium-fortified juice. A special MedGuide will be given to you by the pharmacist with each prescription and refill. Be sure to read this information carefully each time. Talk to your pediatrician regarding the use of this medicine in children. Special care may be needed. Overdosage: If you think you have taken too much of this medicine contact a poison control center or emergency room at once. NOTE: This medicine is only for you. Do not share this medicine with others. What if I miss a dose? If you miss a dose, take it as soon as you can. If it is almost time for your next dose, take only that dose. Do not take double or extra doses. What may interact with this medicine? Do not take this medicine with any of  the following medications: -cisapride -droperidol -terfenadine -tizanidine This medicine may also interact with the following medications: -antacids -birth control pills -caffeine -cyclosporin -didanosine (ddI) buffered tablets or powder -medicines for diabetes -medicines for inflammation like ibuprofen,  naproxen -methotrexate -multivitamins -omeprazole -phenytoin -probenecid -sucralfate -theophylline -warfarin This list may not describe all possible interactions. Give your health care provider a list of all the medicines, herbs, non-prescription drugs, or dietary supplements you use. Also tell them if you smoke, drink alcohol, or use illegal drugs. Some items may interact with your medicine. What should I watch for while using this medicine? Tell your doctor or health care professional if your symptoms do not improve. Do not treat diarrhea with over the counter products. Contact your doctor if you have diarrhea that lasts more than 2 days or if it is severe and watery. You may get drowsy or dizzy. Do not drive, use machinery, or do anything that needs mental alertness until you know how this medicine affects you. Do not stand or sit up quickly, especially if you are an older patient. This reduces the risk of dizzy or fainting spells. This medicine can make you more sensitive to the sun. Keep out of the sun. If you cannot avoid being in the sun, wear protective clothing and use sunscreen. Do not use sun lamps or tanning beds/booths. Avoid antacids, aluminum, calcium, iron, magnesium, and zinc products for 6 hours before and 2 hours after taking a dose of this medicine. What side effects may I notice from receiving this medicine? Side effects that you should report to your doctor or health care professional as soon as possible: - allergic reactions like skin rash, itching or hives, swelling of the face, lips, or tongue - breathing problems - confusion, nightmares or hallucinations - feeling faint or lightheaded, falls - irregular heartbeat - joint, muscle or tendon pain or swelling - pain or trouble passing urine -persistent headache with or without blurred vision - redness, blistering, peeling or loosening of the skin, including inside the mouth - seizure - unusual pain,  numbness, tingling, or weakness Side effects that usually do not require medical attention (report to your doctor or health care professional if they continue or are bothersome): - diarrhea - nausea or stomach upset - white patches or sores in the mouth This list may not describe all possible side effects. Call your doctor for medical advice about side effects. You may report side effects to FDA at 1-800-FDA-1088. Where should I keep my medicine? Keep out of the reach of children. Store at room temperature below 30 degrees C (86 degrees F). Keep container tightly closed. Throw away any unused medicine after the expiration date. NOTE: This sheet is a summary. It may not cover all possible information. If you have questions about this medicine, talk to your doctor, pharmacist, or health care provider.  2015, Elsevier/Gold Standard. (2012-07-29 16:10:46) Metronidazole tablets or capsules What is this medicine? METRONIDAZOLE (me troe NI da zole) is an antiinfective. It is used to treat certain kinds of bacterial and protozoal infections. It will not work for colds, flu, or other viral infections. This medicine may be used for other purposes; ask your health care provider or pharmacist if you have questions. COMMON BRAND NAME(S): Flagyl What should I tell my health care provider before I take this medicine? They need to know if you have any of these conditions: -anemia or other blood disorders -disease of the nervous system -fungal or yeast infection -if you drink alcohol containing drinks -  liver disease -seizures -an unusual or allergic reaction to metronidazole, or other medicines, foods, dyes, or preservatives -pregnant or trying to get pregnant -breast-feeding How should I use this medicine? Take this medicine by mouth with a full glass of water. Follow the directions on the prescription label. Take your medicine at regular intervals. Do not take your medicine more often than directed.  Take all of your medicine as directed even if you think you are better. Do not skip doses or stop your medicine early. Talk to your pediatrician regarding the use of this medicine in children. Special care may be needed. Overdosage: If you think you have taken too much of this medicine contact a poison control center or emergency room at once. NOTE: This medicine is only for you. Do not share this medicine with others. What if I miss a dose? If you miss a dose, take it as soon as you can. If it is almost time for your next dose, take only that dose. Do not take double or extra doses. What may interact with this medicine? Do not take this medicine with any of the following medications: -alcohol or any product that contains alcohol -amprenavir oral solution -cisapride -disulfiram -dofetilide -dronedarone -paclitaxel injection -pimozide -ritonavir oral solution -sertraline oral solution -sulfamethoxazole-trimethoprim injection -thioridazine -ziprasidone This medicine may also interact with the following medications: -birth control pills -cimetidine -lithium -other medicines that prolong the QT interval (cause an abnormal heart rhythm) -phenobarbital -phenytoin -warfarin This list may not describe all possible interactions. Give your health care provider a list of all the medicines, herbs, non-prescription drugs, or dietary supplements you use. Also tell them if you smoke, drink alcohol, or use illegal drugs. Some items may interact with your medicine. What should I watch for while using this medicine? Tell your doctor or health care professional if your symptoms do not improve or if they get worse. You may get drowsy or dizzy. Do not drive, use machinery, or do anything that needs mental alertness until you know how this medicine affects you. Do not stand or sit up quickly, especially if you are an older patient. This reduces the risk of dizzy or fainting spells. Avoid alcoholic drinks  while you are taking this medicine and for three days afterward. Alcohol may make you feel dizzy, sick, or flushed. If you are being treated for a sexually transmitted disease, avoid sexual contact until you have finished your treatment. Your sexual partner may also need treatment. What side effects may I notice from receiving this medicine? Side effects that you should report to your doctor or health care professional as soon as possible: -allergic reactions like skin rash or hives, swelling of the face, lips, or tongue -confusion, clumsiness -difficulty speaking -discolored or sore mouth -dizziness -fever, infection -numbness, tingling, pain or weakness in the hands or feet -trouble passing urine or change in the amount of urine -redness, blistering, peeling or loosening of the skin, including inside the mouth -seizures -unusually weak or tired -vaginal irritation, dryness, or discharge Side effects that usually do not require medical attention (report to your doctor or health care professional if they continue or are bothersome): -diarrhea -headache -irritability -metallic taste -nausea -stomach pain or cramps -trouble sleeping This list may not describe all possible side effects. Call your doctor for medical advice about side effects. You may report side effects to FDA at 1-800-FDA-1088. Where should I keep my medicine? Keep out of the reach of children. Store at room temperature below 25 degrees C (  77 degrees F). Protect from light. Keep container tightly closed. Throw away any unused medicine after the expiration date. NOTE: This sheet is a summary. It may not cover all possible information. If you have questions about this medicine, talk to your doctor, pharmacist, or health care provider.  2015, Elsevier/Gold Standard. (2012-07-30 14:08:39)

## 2014-09-25 NOTE — Care Management Important Message (Signed)
Important Message  Patient Details  Name: Nichole Munoz MRN: [id number] Date of Birth: 02/07/1945   Medicare Important Message Given:  [facility] notification given    Camillo Flaming 09/25/2014, 11:08 AMImportant Message  Patient Details  Name: Nichole Munoz MRN: [id number] Date of Birth: 10/27/1945   Medicare Important Message Given:  Yes-second notification given    Camillo Flaming 09/25/2014, 11:08 AM

## 2014-09-25 NOTE — Progress Notes (Signed)
09/25/14  1600 Patient left prescriptions. Prescriptions were placed in an envelope and mailed to patient.

## 2014-09-25 NOTE — Progress Notes (Signed)
Pt's son at bedside selected [place] for [place]. Referral given to to in house rep with [facility]. Need HHPT and DME cane orders.

## 2014-09-25 NOTE — Evaluation (Signed)
Physical Therapy One Time Evaluation Patient Details Name: Nichole Munoz MRN: [id number] DOB: 01-Jun-1945 Today's Date: 09/25/2014   History of Present Illness  69 year old female with past medical history of hypertension, CKD stage 3, diabetes, diabetic neuropathy and admitted for sepsis secondary to acute diverticulitis / Leukocytosis/ Diarrhea  Clinical Impression  Patient evaluated by Physical Therapy with no further acute PT needs identified. All education has been completed and the patient has no further questions. Pt able to ambulate in hallway without difficulty however reports feeling weakness and unsteadiness (no unsteady gait observed however).  Son asking about 4 prong cane however do not feel 4 prong cane would be appropriate at this time.  Would recommend SPC however.  Son states pt needs assist with stairs into apartment so [facility] would be more appropriate.  Also recommend HHPT.  PT is signing off. Thank you for this referral.     Follow Up Recommendations Home health PT    Equipment Recommendations  Cane    Recommendations for Other Services       Precautions / Restrictions Precautions Precautions: None      Mobility  Bed Mobility Overal bed mobility: Modified Independent                Transfers Overall transfer level: Needs assistance Equipment used: None Transfers: Sit to/from Stand Sit to Stand: Supervision            Ambulation/Gait Ambulation/Gait assistance: Min guard;Supervision Ambulation Distance (Feet): 160 Feet Assistive device: None Gait Pattern/deviations: Decreased stride length;Narrow base of support;Step-through pattern     General Gait Details: pt pushed IV pole initially (approx 40 feet) then removed UE support and pt does well ambulating without UE support (no LOB or unsteady gait observed), reports feeling weakness and unsteadiness however  Stairs            Wheelchair Mobility    Modified Rankin (Stroke Patients Only)        Balance Overall balance assessment: No apparent balance deficits (not formally assessed)                                           Pertinent Vitals/Pain Pain Assessment: Faces Faces Pain Scale: Hurts a little bit Pain Location: abdomen Pain Descriptors / Indicators: Sore Pain Intervention(s): Limited activity within patient's tolerance;Monitored during session    Home Living Family/patient expects to be discharged to:: Private residence Living Arrangements: Children Available Help at Discharge: Family Type of Home: Apartment Home Access: Stairs to enter Entrance Stairs-Rails: None Technical brewer of Steps: flight Home Layout: One level Home Equipment: None      Prior Function Level of Independence: Needs assistance   Gait / Transfers Assistance Needed: only needs assist with stairs, son always helps her up/down stairs to steady           Hand Dominance        Extremity/Trunk Assessment               Lower Extremity Assessment: Overall WFL for tasks assessed      Cervical / Trunk Assessment: Normal  Communication   Communication: Prefers language other than English;Other (comment) (son lives with pt and assisted with interpreting)  Cognition Arousal/Alertness: Awake/alert Behavior During Therapy: WFL for tasks assessed/performed Overall Cognitive Status: Difficult to assess  General Comments      Exercises        Assessment/Plan    PT Assessment All further PT needs can be met in the next venue of care  PT Diagnosis     PT Problem List Decreased mobility;Decreased strength;Decreased knowledge of use of DME  PT Treatment Interventions     PT Goals (Current goals can be found in the Care Plan section) Acute Rehab PT Goals PT Goal Formulation: All assessment and education complete, DC therapy    Frequency     Barriers to discharge        Co-evaluation               End  of Session   Activity Tolerance: Patient tolerated treatment well Patient left: in bed;with call bell/phone within reach;with family/visitor present           Time: 1012-1030 PT Time Calculation (min) (ACUTE ONLY): 18 min   Charges:   PT Evaluation $Initial PT Evaluation Tier I: 1 Procedure     PT G Codes:        Nichole Munoz 09/25/2014, 12:36 PM Carmelia Bake, PT, DPT 09/25/2014 Pager: [phone]

## 2014-09-25 NOTE — Discharge Summary (Signed)
Physician Discharge Summary  Nichole Munoz YHO:[id number] DOB: 1945/09/13 DOA: 09/23/2014  PCP: [place] [facility] PA working with him Admit date: 09/23/2014 Discharge date: 09/25/2014  Recommendations for Outpatient Follow-up:  1. Please continue ciprofloxacin and Flagyl for 8 days on discharge for treatment of acute diverticulitis. Please follow-up with primary care physician upon completion of antibodies to make sure symptoms are controlled. 2. Please note we have held lisinopril and metformin because of slightly abnormal renal function. Creatinine is 1.37 prior to discharge. Please follow-up with primary care physician who can recheck kidney function and make recommendations when these medications can be restarted. 3. For now use Norvasc 5 mg daily for blood pressure control. 4. You may resume Lantus and glipizide for diabetes on discharge  Discharge Diagnoses:  Principal Problem:   Sepsis Active Problems:   Acute diverticulitis   Leukocytosis   Diabetic neuropathy   CKD (chronic kidney disease) stage 3, GFR 30-59 ml/min   Benign essential HTN   DM (diabetes mellitus), type 2, uncontrolled, with renal complications   Diarrhea   Hypomagnesemia    Discharge Condition: stable   Diet recommendation: as tolerated   History of present illness:  69 year old female with past medical history of hypertension, CKD stage 3, diabetes who presented to [facility] ED with diffuse abdominal pain with associated nausea, occasional vomiting and diarrhea for past few days prior to this admission. She was found to have acute diverticulitis as seen on abdomen CT on admission. Patient was started on Zosyn.   Hospital Course:   Assessment/Plan:     Principal Problem: Sepsis secondary to acute diverticulitis / Leukocytosis/ Diarrhea - Sepsis criteria met on admission with tachycardia, tachypnea, leukocytosis, lactic acidosis. Procalcitonin was WNL. Source of infection thought to be  acute diverticulitis as seen on CT abdomen on the admission. - Patient started on Zosyn on the admission. - We advanced the diet slowly and currently she tolerates regular diet. - We will change to Cipro and Flagyl on discharge for 8 more days. - Since diarrhea has spontaneously resolved stool for C. difficile was not collected.  Active Problems: CKD (chronic kidney disease) stage 3, GFR 30-59 ml/min - Baseline creatinine is 1.7 (about 8 months ago) - Creatinine 1.56 on this admission, stable  Hypomagnesemia - Supplemented.  - Magnesium was 1.2 this morning. We will supplement today with magnesium sulfate 2 g once.  DM (diabetes mellitus), type 2, uncontrolled, with renal complications - A0U in 0156 was 10.5 indicating poor glycemic control - A1c is pending prior to discharge. It will be followed up on a basis. - Because of renal insufficiency it is reasonable to continue to hold metformin.  - Patient instructed to continue glipizide and Lantus on discharge  Diabetic neuropathy - Continue gabapentin on discharge    Benign essential HTN - Lisinopril on hold because of renal insufficiency. We changed blood pressure medication to Norvasc 5 mg daily.   DVT Prophylaxis  - SCD's bilaterally in hospital.   Code Status: Full.  Family Communication: plan of care discussed with the patient and her daughter at the bedside    IV access:  Peripheral IV  Procedures and diagnostic studies:   Ct Abdomen Pelvis W Contrast 09/23/2014 1. Multiple diverticula are noted descending colon and sigmoid colon. There is inflammatory process in descending colon axial image 48 stranding of pericolonic fat and thickening of diverticular wall. Findings are consistent with acute diverticulitis or focal colitis. No diverticular abscess. No definite evidence of  perforation. 2. There is abnormal thickening of urinary bladder wall. Cystitis is suspected. 3. Normal appendix. No pericecal  inflammation. 4. No hydronephrosis or hydroureter. There is right nonobstructive nephrolithiasis. 5. No small bowel obstruction. Electronically Signed By: Natasha Mead M.D. On: 09/23/2014 17:18   Dg Chest Portable 1 View 09/23/2014 Hypoinflation of the lungs. Focal crowding of the interstitial markings in the left lower lobe suggestive of atelectasis. Airspace disease is felt less likely, however follow-up with PA and lateral radiograph of the chest may be considered. Electronically Signed By: Ted Mcalpine M.D. On: 09/23/2014 15:10    Medical Consultants:  None   Other Consultants:  None   IAnti-Infectives:   Zosyn 09/23/2014 --> 09/25/2014   Signed:  Manson Passey, MD  [facility] 09/25/2014, 10:17 AM  Pager #: [phone]  Time spent in minutes: more than 30 minutes   Discharge Exam: Filed Vitals:   09/25/14 0451  BP: 146/73  Pulse: 84  Temp: 98.2 F (36.8 C)  Resp: 18   Filed Vitals:   09/24/14 2036 09/24/14 2205 09/25/14 0450 09/25/14 0451  BP:  129/78  146/73  Pulse:  91  84  Temp:  99.1 F (37.3 C)  98.2 F (36.8 C)  TempSrc:  Oral  Oral  Resp:  20  18  Height:      Weight:   64.32 kg (141 lb 12.8 oz)   SpO2: 100% 100%  100%    General: Pt is alert, follows commands appropriately, not in acute distress Cardiovascular: Regular rate and rhythm, S1/S2 + Respiratory: Clear to auscultation bilaterally, no wheezing, no crackles, no rhonchi Abdominal: Soft, non tender, non distended, bowel sounds +, no guarding Extremities: no edema, no cyanosis, pulses palpable bilaterally DP and PT Neuro: Grossly nonfocal  Discharge Instructions  Discharge Instructions    Call MD for:  difficulty breathing, headache or visual disturbances    Complete by:  As directed      Call MD for:  persistant dizziness or light-headedness    Complete by:  As directed      Call MD for:  persistant nausea and vomiting    Complete by:  As directed       Call MD for:  severe uncontrolled pain    Complete by:  As directed      Diet - low sodium heart healthy    Complete by:  As directed      Discharge instructions    Complete by:  As directed   1. Please continue ciprofloxacin and Flagyl for 8 days on discharge for treatment of acute diverticulitis. Please follow-up with primary care physician upon completion of antibodies to make sure symptoms are controlled. 2. Please note we have held lisinopril and metformin because of slightly abnormal renal function. Creatinine is 1.37 prior to discharge. Please follow-up with primary care physician who can recheck kidney function and make recommendations when these medications can be restarted. 3. For now use Norvasc 5 mg daily for blood pressure control. 4. You may resume Lantus and glipizide for diabetes on discharge     Increase activity slowly    Complete by:  As directed             Medication List    STOP taking these medications        azithromycin 250 MG tablet  Commonly known as:  ZITHROMAX Z-PAK     lisinopril 10 MG tablet  Commonly known as:  PRINIVIL,ZESTRIL     LORazepam 1 MG tablet  Commonly known as:  ATIVAN     meclizine 25 MG tablet  Commonly known as:  ANTIVERT     metFORMIN 1000 MG tablet  Commonly known as:  GLUCOPHAGE      TAKE these medications        acetaminophen 325 MG tablet  Commonly known as:  TYLENOL  Take 2 tablets (650 mg total) by mouth every 6 (six) hours as needed for mild pain (or Fever >/= 101).     albuterol 108 (90 BASE) MCG/ACT inhaler  Commonly known as:  PROVENTIL HFA;VENTOLIN HFA  Inhale 2 puffs into the lungs every 6 (six) hours as needed for wheezing or shortness of breath.     amLODipine 5 MG tablet  Commonly known as:  NORVASC  Take 1 tablet (5 mg total) by mouth daily.     beclomethasone 80 MCG/ACT inhaler  Commonly known as:  QVAR  Inhale 2-4 puffs into the lungs every 4 (four) hours as needed (SOB).     cetirizine 10 MG  tablet  Commonly known as:  ZYRTEC  Take 0.5 tablets (5 mg total) by mouth daily.     ciprofloxacin 500 MG tablet  Commonly known as:  CIPRO  Take 1 tablet (500 mg total) by mouth 2 (two) times daily.     gabapentin 300 MG capsule  Commonly known as:  NEURONTIN  Take 1 capsule (300 mg total) by mouth 3 (three) times daily.     glipiZIDE 10 MG 24 hr tablet  Commonly known as:  GLUCOTROL XL  Take 1 tablet (10 mg total) by mouth every morning.     ibuprofen 200 MG tablet  Commonly known as:  ADVIL,MOTRIN  Take 200 mg by mouth every 6 (six) hours as needed for headache, mild pain or moderate pain.     Insulin Glargine 100 UNIT/ML Solostar Pen  Commonly known as:  LANTUS SOLOSTAR  Inject 20 Units into the skin every morning.     metroNIDAZOLE 500 MG tablet  Commonly known as:  FLAGYL  Take 1 tablet (500 mg total) by mouth 3 (three) times daily.     Vitamin D 2000 UNITS Caps  Take 2,000 Units by mouth daily.           Follow-up Information    Follow up with [facility]. Schedule an appointment as soon as possible for a visit in 1 week.   Why:  Follow up appt after recent hospitalization   Contact information:   [place]  [phone]        The results of significant diagnostics from this hospitalization (including imaging, microbiology, ancillary and laboratory) are listed below for reference.    Significant Diagnostic Studies: Ct Abdomen Pelvis W Contrast  09/23/2014   CLINICAL DATA:  Left lower quadrant pain, diarrhea  EXAM: CT ABDOMEN AND PELVIS WITH CONTRAST  TECHNIQUE: Multidetector CT imaging of the abdomen and pelvis was performed using the standard protocol following bolus administration of intravenous contrast.  CONTRAST:  78mL OMNIPAQUE IOHEXOL 300 MG/ML SOLN, 34mL OMNIPAQUE IOHEXOL 300 MG/ML SOLN  COMPARISON:  04/04/2011  FINDINGS: Sagittal images of the spine shows degenerative changes thoracolumbar  spine. Bilateral pars defect at L5 level. Enhanced liver, pancreas, spleen and adrenal glands are unremarkable. Gallbladder is contracted without evidence of calcified gallstones. Atherosclerotic calcifications of abdominal aorta and iliac arteries. No aortic aneurysm. Kidneys are symmetrical in size and enhancement. No hydronephrosis or hydroureter. There is a nonobstructive calcified  calculus in midpole of the right kidney measures 3 mm.  Moderate colonic stool. No pericecal inflammation. Normal appendix is partially visualized. The terminal ileum is unremarkable. Multiple diverticula are noted in descending colon and sigmoid colon. In axial image 47 there is stranding of pericolonic fat in descending colon. Findings are consistent with acute diverticulitis or focal colitis. No diverticular abscess.  The uterus and ovaries are unremarkable. There is thickening of urinary bladder wall. Findings are highly suspicious for cystitis. Clinical correlation is necessary.  No small bowel obstruction.  No ascites or free air.  No adenopathy.  IMPRESSION: 1. Multiple diverticula are noted descending colon and sigmoid colon. There is inflammatory process in descending colon axial image 48 stranding of pericolonic fat and thickening of diverticular wall. Findings are consistent with acute diverticulitis or focal colitis. No diverticular abscess. No definite evidence of perforation. 2. There is abnormal thickening of urinary bladder wall. Cystitis is suspected. 3. Normal appendix.  No pericecal inflammation. 4. No hydronephrosis or hydroureter. There is right nonobstructive nephrolithiasis. 5. No small bowel obstruction.   Electronically Signed   By: Lahoma Crocker M.D.   On: 09/23/2014 17:18   Dg Chest Portable 1 View  09/23/2014   CLINICAL DATA:  Right lower quadrant abdominal pain, which radiates to the left abdomen and back. Shortness of breath for 2 days.  EXAM: PORTABLE CHEST - 1 VIEW  COMPARISON:  06/29/2014  FINDINGS:  Cardiomediastinal silhouette is normal for portable technique. Mediastinal contours appear intact.  There is no evidence of focal airspace consolidation, pleural effusion or pneumothorax. Lungs are hypoinflated. Exaggerated crowding of the interstitial markings is noted in the left lower lobe.  Osseous structures are without acute abnormality. Soft tissues are grossly normal.  IMPRESSION: Hypoinflation of the lungs.  Focal crowding of the interstitial markings in the left lower lobe suggestive of atelectasis. Airspace disease is felt less likely, however follow-up with PA and lateral radiograph of the chest may be considered.   Electronically Signed   By: Fidela Salisbury M.D.   On: 09/23/2014 15:10    Microbiology: Recent Results (from the past 240 hour(s))  Urine culture     Status: None (Preliminary result)   Collection Time: 09/23/14  2:59 PM  Result Value Ref Range Status   Specimen Description URINE, RANDOM  Final   Special Requests NONE  Final   Culture   Final    CULTURE REINCUBATED FOR BETTER GROWTH Performed at [facility]    Report Status PENDING  Incomplete  Culture, blood (routine x 2)     Status: None (Preliminary result)   Collection Time: 09/23/14  4:00 PM  Result Value Ref Range Status   Specimen Description BLOOD RIGHT ANTECUBITAL  Final   Special Requests BOTTLES DRAWN AEROBIC AND ANAEROBIC 10CC EACH  Final   Culture   Final    NO GROWTH < 24 HOURS Performed at [facility]    Report Status PENDING  Incomplete  Blood Culture (routine x 2)     Status: None (Preliminary result)   Collection Time: 09/23/14  4:05 PM  Result Value Ref Range Status   Specimen Description BLOOD RIGHT ANTECUBITAL  Final   Special Requests BOTTLES DRAWN AEROBIC AND ANAEROBIC 10 CC EACH  Final   Culture   Final    NO GROWTH < 24 HOURS Performed at [facility]    Report Status PENDING  Incomplete     Labs: Basic Metabolic Panel:  Recent Labs Lab  09/23/14  1440 09/23/14 2046 09/24/14 0115 09/25/14 0441  NA 137 139 141 142  K 4.2 4.3 4.0 3.8  CL 100* 104 108 107  CO2 $Re'28 26 24 26  'uUg$ GLUCOSE 393* 158* 123* 102*  BUN 36* 28* 25* 14  CREATININE 1.56* 1.36* 1.24* 1.37*  CALCIUM 9.2 8.5* 8.1* 8.4*  MG  --  1.0*  --  1.2*  PHOS  --  2.8  --   --    Liver Function Tests:  Recent Labs Lab 09/23/14 1440 09/23/14 2046 09/24/14 0115  AST $Re'22 21 20  'Lqj$ ALT 14 12* 13*  ALKPHOS 71 66 62  BILITOT 0.9 1.1 1.0  PROT 8.4* 7.7 7.4  ALBUMIN 4.3 3.7 3.5    Recent Labs Lab 09/23/14 1440  LIPASE 31   No results for input(s): AMMONIA in the last 168 hours. CBC:  Recent Labs Lab 09/23/14 1440 09/23/14 2046 09/24/14 0115 09/25/14 0441  WBC 13.8* 13.9* 13.8* 10.0  NEUTROABS  --  9.2*  --   --   HGB 12.3 11.0* 10.9* 10.7*  HCT 35.4* 31.8* 32.2* 31.7*  MCV 87.2 88.1 87.3 87.8  PLT 214 192 178 183   Cardiac Enzymes: No results for input(s): CKTOTAL, CKMB, CKMBINDEX, TROPONINI in the last 168 hours. BNP: BNP (last 3 results)  Recent Labs  09/23/14 1450  BNP 18.1    ProBNP (last 3 results) No results for input(s): PROBNP in the last 8760 hours.  CBG:  Recent Labs Lab 09/24/14 0810 09/24/14 1104 09/24/14 1657 09/24/14 2209 09/25/14 0741  GLUCAP 160* 177* 145* 104* 108*

## 2014-09-28 LAB — CULTURE, BLOOD (ROUTINE X 2)
Culture: NO GROWTH
Culture: NO GROWTH

## 2014-09-28 MED ORDER — AMLODIPINE BESYLATE 5 MG PO TABS: 5.0000 mg | ORAL_TABLET | Freq: Every day | ORAL | Status: DC

## 2014-09-28 MED ORDER — METRONIDAZOLE 500 MG PO TABS: 500.0000 mg | ORAL_TABLET | Freq: Three times a day (TID) | ORAL | Status: DC

## 2014-09-28 MED ORDER — CIPROFLOXACIN HCL 500 MG PO TABS: 500.0000 mg | ORAL_TABLET | Freq: Two times a day (BID) | ORAL | Status: DC

## 2014-09-28 NOTE — Progress Notes (Signed)
Pt had been missing prescriptions from discharge. He called the unit yesterday about them. The Charge Nurse contacted the doctor about the missing prescriptions, but the MD did not call back. Then this morning the prescriptions were found on the units printer. The Charge nurse called [facility] and verified that this patient already had these medications filled and waiting for him at their store. The Charge nurse called the patient's only contact number and left a message that the medications were ready for pick up at [facility] pharmacy.   Othella Boyer [facility] 09/28/2014 11:59 AM

## 2015-02-05 ENCOUNTER — Encounter (HOSPITAL_COMMUNITY): Payer: Self-pay

## 2015-02-05 ENCOUNTER — Emergency Department (HOSPITAL_COMMUNITY): Payer: Medicare Other

## 2015-02-05 ENCOUNTER — Emergency Department (HOSPITAL_COMMUNITY)
Admission: EM | Admit: 2015-02-05 | Discharge: 2015-02-05 | Disposition: A | Discharge status: Home / Self Care | Payer: Medicare Other | Attending: Emergency Medicine | Admitting: Emergency Medicine

## 2015-02-05 DIAGNOSIS — Z87891 Personal history of nicotine dependence: Secondary | ICD-10-CM | POA: Insufficient documentation

## 2015-02-05 DIAGNOSIS — Z794 Long term (current) use of insulin: Secondary | ICD-10-CM | POA: Insufficient documentation

## 2015-02-05 DIAGNOSIS — I10 Essential (primary) hypertension: Secondary | ICD-10-CM | POA: Diagnosis not present

## 2015-02-05 DIAGNOSIS — R112 Nausea with vomiting, unspecified: Secondary | ICD-10-CM | POA: Insufficient documentation

## 2015-02-05 DIAGNOSIS — R1084 Generalized abdominal pain: Secondary | ICD-10-CM | POA: Insufficient documentation

## 2015-02-05 DIAGNOSIS — Z7984 Long term (current) use of oral hypoglycemic drugs: Secondary | ICD-10-CM | POA: Insufficient documentation

## 2015-02-05 DIAGNOSIS — E1140 Type 2 diabetes mellitus with diabetic neuropathy, unspecified: Secondary | ICD-10-CM | POA: Diagnosis not present

## 2015-02-05 DIAGNOSIS — R197 Diarrhea, unspecified: Secondary | ICD-10-CM | POA: Insufficient documentation

## 2015-02-05 DIAGNOSIS — Z7951 Long term (current) use of inhaled steroids: Secondary | ICD-10-CM | POA: Diagnosis not present

## 2015-02-05 DIAGNOSIS — Z79899 Other long term (current) drug therapy: Secondary | ICD-10-CM | POA: Diagnosis not present

## 2015-02-05 DIAGNOSIS — E669 Obesity, unspecified: Secondary | ICD-10-CM | POA: Insufficient documentation

## 2015-02-05 DIAGNOSIS — J45909 Unspecified asthma, uncomplicated: Secondary | ICD-10-CM | POA: Insufficient documentation

## 2015-02-05 LAB — COMPREHENSIVE METABOLIC PANEL
ALBUMIN: 3.8 g/dL (ref 3.5–5.0)
ALT: 13 U/L — ABNORMAL LOW (ref 14–54)
ANION GAP: 14 (ref 5–15)
AST: 24 U/L (ref 15–41)
Alkaline Phosphatase: 65 U/L (ref 38–126)
BILIRUBIN TOTAL: 1.3 mg/dL — AB (ref 0.3–1.2)
BUN: 29 mg/dL — ABNORMAL HIGH (ref 6–20)
CHLORIDE: 101 mmol/L (ref 101–111)
CO2: 25 mmol/L (ref 22–32)
Calcium: 9.4 mg/dL (ref 8.9–10.3)
Creatinine, Ser: 1.27 mg/dL — ABNORMAL HIGH (ref 0.44–1.00)
GFR calc Af Amer: 48 mL/min — ABNORMAL LOW (ref >60)
GFR calc non Af Amer: 42 mL/min — ABNORMAL LOW (ref >60)
GLUCOSE: 304 mg/dL — AB (ref 65–99)
POTASSIUM: 4.1 mmol/L (ref 3.5–5.1)
SODIUM: 140 mmol/L (ref 135–145)
TOTAL PROTEIN: 7.8 g/dL (ref 6.5–8.1)

## 2015-02-05 LAB — CBC
HEMATOCRIT: 40.3 % (ref 36.0–46.0)
HEMOGLOBIN: 13.9 g/dL (ref 12.0–15.0)
MCH: 30.2 pg (ref 26.0–34.0)
MCHC: 34.5 g/dL (ref 30.0–36.0)
MCV: 87.6 fL (ref 78.0–100.0)
Platelets: 274 10*3/uL (ref 150–400)
RBC: 4.6 MIL/uL (ref 3.87–5.11)
RDW: 12.1 % (ref 11.5–15.5)
WBC: 15 10*3/uL — ABNORMAL HIGH (ref 4.0–10.5)

## 2015-02-05 LAB — URINALYSIS, ROUTINE W REFLEX MICROSCOPIC
GLUCOSE, UA: 250 mg/dL — AB
Hgb urine dipstick: NEGATIVE
Ketones, ur: NEGATIVE mg/dL
NITRITE: NEGATIVE
PH: 5.5 (ref 5.0–8.0)
Protein, ur: NEGATIVE mg/dL
SPECIFIC GRAVITY, URINE: 1.024 (ref 1.005–1.030)

## 2015-02-05 LAB — URINE MICROSCOPIC-ADD ON: RBC / HPF: NONE SEEN RBC/hpf (ref 0–5)

## 2015-02-05 LAB — LIPASE, BLOOD: LIPASE: 35 U/L (ref 11–51)

## 2015-02-05 LAB — CBG MONITORING, ED: GLUCOSE-CAPILLARY: 278 mg/dL — AB (ref 65–99)

## 2015-02-05 MED ORDER — ONDANSETRON HCL 4 MG/2ML IJ SOLN
4.0000 mg | Freq: Once | INTRAMUSCULAR | Status: AC | PRN
  Administered 2015-02-05: 4 mg via INTRAVENOUS
  Filled 2015-02-05: qty 2

## 2015-02-05 MED ORDER — SODIUM CHLORIDE 0.9 % IV BOLUS (SEPSIS)
1000.0000 mL | Freq: Once | INTRAVENOUS | Status: AC
  Administered 2015-02-05: 1000 mL via INTRAVENOUS

## 2015-02-05 MED ORDER — HYOSCYAMINE SULFATE 0.125 MG SL SUBL: 0.1250 mg | SUBLINGUAL_TABLET | SUBLINGUAL | Status: DC | PRN

## 2015-02-05 MED ORDER — KETOROLAC TROMETHAMINE 30 MG/ML IJ SOLN
30.0000 mg | Freq: Once | INTRAMUSCULAR | Status: AC
  Administered 2015-02-05: 30 mg via INTRAVENOUS
  Filled 2015-02-05: qty 1

## 2015-02-05 MED ORDER — DICYCLOMINE HCL 10 MG/ML IM SOLN
20.0000 mg | Freq: Once | INTRAMUSCULAR | Status: AC
  Administered 2015-02-05: 20 mg via INTRAMUSCULAR
  Filled 2015-02-05: qty 2

## 2015-02-05 MED ORDER — ONDANSETRON 8 MG PO TBDP: ORAL_TABLET | ORAL | Status: DC

## 2015-02-05 NOTE — ED Notes (Signed)
Bed: WA17 Expected date:  Expected time:  Means of arrival:  Comments: 69 diarrhea

## 2015-02-05 NOTE — ED Notes (Signed)
Pt was given Sprite for a PO challenge. She drank it with no problem

## 2015-02-05 NOTE — ED Provider Notes (Signed)
CSN: [id number]     Arrival date & time 02/05/15  0142 History  By signing my name below, I, Helane Gunther, attest that this documentation has been prepared under the direction and in the presence of Markeita Alicia, MD. Electronically Signed: Helane Gunther, ED Scribe. 02/05/2015. 3:20 AM.    Chief Complaint  Patient presents with  . Abdominal Pain  . Diarrhea   Patient is a 70 y.o. female presenting with abdominal pain. The history is provided by a relative and the patient. No language interpreter was used.  Abdominal Pain Pain location:  Generalized Pain quality: cramping   Pain radiates to:  Does not radiate Pain severity:  Moderate Duration:  12 hours Timing:  Constant Progression:  Unchanged Chronicity:  New Context: sick contacts   Context: not trauma   Relieved by:  Nothing Worsened by:  Nothing tried Ineffective treatments:  None tried Associated symptoms: nausea   Nausea:    Severity:  Moderate   Onset quality:  Gradual   Timing:  Constant   Progression:  Unchanged Risk factors: being elderly    HPI Comments: Nichole Munoz is a 70 y.o. female former smoker with a PMHx of asthma, DM, HTN, and diabetic neuropathy brought in by ambulance, who presents to the Emergency Department complaining of abdominal pain and diarrhea (7x) onset nearly 12 hours ago. Per relative, pt's last episode of diarrhea occurred about 2 hours and 45 minutes ago. Pt reports associated nausea.  He states he has been sick with the same for the past few days.  They ate the same food and he got symptoms first and the patient developed exactly the same thing Per relative, pt is allergic to Clorox and chicken.   Past Medical History  Diagnosis Date  . Diabetes mellitus   . Hypertension   . Asthma   . Diabetic neuropathy ([place])   . Seasonal allergies   . Obesity    History reviewed. No pertinent past surgical history. History reviewed. No pertinent family history. Social History  Substance Use Topics   . Smoking status: Former Research scientist (life sciences)  . Smokeless tobacco: Never Used  . Alcohol Use: No   OB History    No data available     Review of Systems  Gastrointestinal: Positive for nausea and abdominal pain.  All other systems reviewed and are negative.   Allergies  Other  Home Medications   Prior to Admission medications   Medication Sig Start Date End Date Taking? Authorizing Provider  acetaminophen (TYLENOL) 325 MG tablet Take 2 tablets (650 mg total) by mouth every 6 (six) hours as needed for mild pain (or Fever >/= 101). 09/25/14  Yes Robbie Lis, MD  albuterol (PROVENTIL HFA;VENTOLIN HFA) 108 (90 BASE) MCG/ACT inhaler Inhale 2 puffs into the lungs every 6 (six) hours as needed for wheezing or shortness of breath.   Yes Historical Provider, MD  amLODipine (NORVASC) 5 MG tablet Take 1 tablet (5 mg total) by mouth daily. 09/28/14  Yes Robbie Lis, MD  beclomethasone (QVAR) 80 MCG/ACT inhaler Inhale 2-4 puffs into the lungs every 4 (four) hours as needed (SOB).  01/22/12  Yes Virgel Manifold, MD  cetirizine (ZYRTEC) 10 MG tablet Take 0.5 tablets (5 mg total) by mouth daily. 10/27/11  Yes Charlann Lange, PA-C  Cholecalciferol (VITAMIN D) 2000 UNITS CAPS Take 2,000 Units by mouth daily.   Yes Historical Provider, MD  gabapentin (NEURONTIN) 300 MG capsule Take 1 capsule (300 mg total) by mouth 3 (three) times daily.  01/22/12  Yes Virgel Manifold, MD  glipiZIDE (GLUCOTROL XL) 10 MG 24 hr tablet Take 1 tablet (10 mg total) by mouth every morning. Patient taking differently: Take 10 mg by mouth 2 (two) times daily.  04/15/13  Yes Domenic Polite, MD  hydrOXYzine (ATARAX/VISTARIL) 10 MG tablet Take 10 mg by mouth 2 (two) times daily as needed. For itching. 11/24/14  Yes Historical Provider, MD  ibuprofen (ADVIL,MOTRIN) 200 MG tablet Take 200 mg by mouth every 6 (six) hours as needed for headache, mild pain or moderate pain.   Yes Historical Provider, MD  Insulin Glargine (LANTUS SOLOSTAR) 100 UNIT/ML  Solostar Pen Inject 20 Units into the skin every morning. 09/25/14  Yes Robbie Lis, MD  lisinopril (PRINIVIL,ZESTRIL) 10 MG tablet Take 10 mg by mouth at bedtime. 11/24/14  Yes Historical Provider, MD  metFORMIN (GLUCOPHAGE) 1000 MG tablet Take 1,000 mg by mouth 2 (two) times daily. 11/24/14  Yes Historical Provider, MD  ciprofloxacin (CIPRO) 500 MG tablet Take 1 tablet (500 mg total) by mouth 2 (two) times daily. Patient not taking: Reported on 02/05/2015 09/28/14   Robbie Lis, MD  metroNIDAZOLE (FLAGYL) 500 MG tablet Take 1 tablet (500 mg total) by mouth 3 (three) times daily. Patient not taking: Reported on 02/05/2015 09/28/14   Robbie Lis, MD   BP 134/102 mmHg  Pulse 104  Temp(Src) 98.2 F (36.8 C) (Oral)  Resp 18  SpO2 98% Physical Exam  Constitutional: She is oriented to person, place, and time. She appears well-developed and well-nourished.  HENT:  Head: Normocephalic and atraumatic.  Mouth/Throat: Oropharynx is clear and moist. No oropharyngeal exudate.  Eyes: Conjunctivae and EOM are normal. Pupils are equal, round, and reactive to light. Right eye exhibits no discharge. Left eye exhibits no discharge.  Neck: Normal range of motion. Neck supple.  No bruits  Cardiovascular: Normal rate, regular rhythm and normal heart sounds.   RRR  Pulmonary/Chest: Effort normal and breath sounds normal. No stridor. No respiratory distress. She has no wheezes. She has no rales.  Lungs are CTA  Abdominal: Soft. She exhibits no distension and no mass. There is no tenderness. There is no rebound and no guarding.  Hyperactive bowel sounds  Musculoskeletal: Normal range of motion.  Neurological: She is alert and oriented to person, place, and time. Coordination normal.  Skin: Skin is warm and dry. No rash noted. She is not diaphoretic. No erythema.  No lesions  Psychiatric: She has a normal mood and affect.  Nursing note and vitals reviewed.   ED Course  Procedures  DIAGNOSTIC  STUDIES: Oxygen Saturation is 98% on RA, normal by my interpretation.    COORDINATION OF CARE: 2:42 AM - Discussed plans to order diagnostic studies. Pt advised of plan for treatment and pt agrees.  Labs Review Labs Reviewed  CBG MONITORING, ED - Abnormal; Notable for the following:    Glucose-Capillary 278 (*)    All other components within normal limits  LIPASE, BLOOD  COMPREHENSIVE METABOLIC PANEL  CBC  URINALYSIS, ROUTINE W REFLEX MICROSCOPIC (NOT AT [facility])    Imaging Review No results found. I have personally reviewed and evaluated these images and lab results as part of my medical decision-making.   EKG Interpretation None      MDM   Final diagnoses:  None    Results for orders placed or performed during the hospital encounter of 02/05/15  Lipase, blood  Result Value Ref Range   Lipase 35 11 - 51 U/L  Comprehensive metabolic panel  Result Value Ref Range   Sodium 140 135 - 145 mmol/L   Potassium 4.1 3.5 - 5.1 mmol/L   Chloride 101 101 - 111 mmol/L   CO2 25 22 - 32 mmol/L   Glucose, Bld 304 (H) 65 - 99 mg/dL   BUN 29 (H) 6 - 20 mg/dL   Creatinine, Ser 1.27 (H) 0.44 - 1.00 mg/dL   Calcium 9.4 8.9 - 10.3 mg/dL   Total Protein 7.8 6.5 - 8.1 g/dL   Albumin 3.8 3.5 - 5.0 g/dL   AST 24 15 - 41 U/L   ALT 13 (L) 14 - 54 U/L   Alkaline Phosphatase 65 38 - 126 U/L   Total Bilirubin 1.3 (H) 0.3 - 1.2 mg/dL   GFR calc non Af Amer 42 (L) >60 mL/min   GFR calc Af Amer 48 (L) >60 mL/min   Anion gap 14 5 - 15  CBC  Result Value Ref Range   WBC 15.0 (H) 4.0 - 10.5 K/uL   RBC 4.60 3.87 - 5.11 MIL/uL   Hemoglobin 13.9 12.0 - 15.0 g/dL   HCT 40.3 36.0 - 46.0 %   MCV 87.6 78.0 - 100.0 fL   MCH 30.2 26.0 - 34.0 pg   MCHC 34.5 30.0 - 36.0 g/dL   RDW 12.1 11.5 - 15.5 %   Platelets 274 150 - 400 K/uL  Urinalysis, Routine w reflex microscopic (not at [facility])  Result Value Ref Range   Color, Urine YELLOW YELLOW   APPearance CLEAR CLEAR   Specific Gravity, Urine 1.024  1.005 - 1.030   pH 5.5 5.0 - 8.0   Glucose, UA 250 (A) NEGATIVE mg/dL   Hgb urine dipstick NEGATIVE NEGATIVE   Bilirubin Urine SMALL (A) NEGATIVE   Ketones, ur NEGATIVE NEGATIVE mg/dL   Protein, ur NEGATIVE NEGATIVE mg/dL   Nitrite NEGATIVE NEGATIVE   Leukocytes, UA SMALL (A) NEGATIVE  Urine microscopic-add on  Result Value Ref Range   Squamous Epithelial / LPF 0-5 (A) NONE SEEN   WBC, UA 6-30 0 - 5 WBC/hpf   RBC / HPF NONE SEEN 0 - 5 RBC/hpf   Bacteria, UA FEW (A) NONE SEEN  CBG monitoring, ED  Result Value Ref Range   Glucose-Capillary 278 (H) 65 - 99 mg/dL   Dg Abd Acute W/chest  02/05/2015  CLINICAL DATA:  Diarrhea EXAM: DG ABDOMEN ACUTE W/ 1V CHEST COMPARISON:  09/23/2014 FINDINGS: Nonobstructive bowel gas pattern. No evidence of bowel perforation or pneumatosis. Small right renal calculus, positioning stable from comparison CT. Pelvic phleboliths. No concerning intra-abdominal mass effect. Normal heart size and mediastinal contours. No acute infiltrate or edema. No effusion or pneumothorax. No acute osseous findings. IMPRESSION: 1. No acute finding. 2. Right nephrolithiasis. Electronically Signed   By: Monte Fantasia M.D.   On: 02/05/2015 05:25    Medications  ondansetron (ZOFRAN) injection 4 mg (4 mg Intravenous Given 02/05/15 0241)  sodium chloride 0.9 % bolus 1,000 mL (0 mLs Intravenous Stopped 02/05/15 0447)  dicyclomine (BENTYL) injection 20 mg (20 mg Intramuscular Given 02/05/15 0327)  ketorolac (TORADOL) 30 MG/ML injection 30 mg (30 mg Intravenous Given 02/05/15 0328)    Better post medication no more nausea nor diarrhea.  PO challenged successfully.  Stable for discharge.  Symptoms are likely viral as both patient and family member live in the same home and have exactly the same conditions. Strict return precautions given  I personally performed the services described in this documentation, which was scribed  in my presence. The recorded information has been reviewed and is  accurate.     Veatrice Kells, MD 02/05/15 [phone]

## 2015-02-05 NOTE — ED Notes (Signed)
Pt made aware of the need for urine.

## 2015-02-05 NOTE — ED Notes (Signed)
Pt BIB EMS. C/o abdominal pain since 0900. Denies vomiting. Endorses nausea and had diarrhea 7x today.

## 2015-02-05 NOTE — ED Notes (Signed)
Pt aware of the need for urine. Said that she would try after some of the fluid was finished.

## 2015-06-11 ENCOUNTER — Observation Stay (HOSPITAL_COMMUNITY)
Admission: EM | Admit: 2015-06-11 | Discharge: 2015-06-14 | Disposition: A | Discharge status: Home / Self Care | Payer: Medicare Other | Attending: Internal Medicine | Admitting: Internal Medicine

## 2015-06-11 ENCOUNTER — Encounter (HOSPITAL_COMMUNITY): Payer: Self-pay | Admitting: Nurse Practitioner

## 2015-06-11 ENCOUNTER — Emergency Department (HOSPITAL_COMMUNITY): Payer: Medicare Other

## 2015-06-11 DIAGNOSIS — Z9119 Patient's noncompliance with other medical treatment and regimen: Secondary | ICD-10-CM | POA: Insufficient documentation

## 2015-06-11 DIAGNOSIS — R079 Chest pain, unspecified: Secondary | ICD-10-CM | POA: Diagnosis present

## 2015-06-11 DIAGNOSIS — Z794 Long term (current) use of insulin: Secondary | ICD-10-CM | POA: Diagnosis not present

## 2015-06-11 DIAGNOSIS — N183 Chronic kidney disease, stage 3 unspecified: Secondary | ICD-10-CM | POA: Diagnosis present

## 2015-06-11 DIAGNOSIS — E1143 Type 2 diabetes mellitus with diabetic autonomic (poly)neuropathy: Secondary | ICD-10-CM | POA: Diagnosis not present

## 2015-06-11 DIAGNOSIS — I129 Hypertensive chronic kidney disease with stage 1 through stage 4 chronic kidney disease, or unspecified chronic kidney disease: Secondary | ICD-10-CM | POA: Insufficient documentation

## 2015-06-11 DIAGNOSIS — Z87891 Personal history of nicotine dependence: Secondary | ICD-10-CM | POA: Diagnosis not present

## 2015-06-11 DIAGNOSIS — E1122 Type 2 diabetes mellitus with diabetic chronic kidney disease: Secondary | ICD-10-CM | POA: Diagnosis not present

## 2015-06-11 DIAGNOSIS — E785 Hyperlipidemia, unspecified: Secondary | ICD-10-CM | POA: Diagnosis present

## 2015-06-11 DIAGNOSIS — J4520 Mild intermittent asthma, uncomplicated: Secondary | ICD-10-CM | POA: Insufficient documentation

## 2015-06-11 DIAGNOSIS — I10 Essential (primary) hypertension: Secondary | ICD-10-CM | POA: Diagnosis present

## 2015-06-11 DIAGNOSIS — R0789 Other chest pain: Secondary | ICD-10-CM | POA: Diagnosis not present

## 2015-06-11 DIAGNOSIS — E669 Obesity, unspecified: Secondary | ICD-10-CM | POA: Insufficient documentation

## 2015-06-11 DIAGNOSIS — D631 Anemia in chronic kidney disease: Secondary | ICD-10-CM | POA: Diagnosis not present

## 2015-06-11 DIAGNOSIS — Z79899 Other long term (current) drug therapy: Secondary | ICD-10-CM | POA: Diagnosis not present

## 2015-06-11 DIAGNOSIS — N1831 Chronic kidney disease, stage 3a: Secondary | ICD-10-CM | POA: Diagnosis present

## 2015-06-11 DIAGNOSIS — E1129 Type 2 diabetes mellitus with other diabetic kidney complication: Secondary | ICD-10-CM | POA: Diagnosis present

## 2015-06-11 DIAGNOSIS — E1169 Type 2 diabetes mellitus with other specified complication: Secondary | ICD-10-CM | POA: Diagnosis present

## 2015-06-11 DIAGNOSIS — E1165 Type 2 diabetes mellitus with hyperglycemia: Secondary | ICD-10-CM | POA: Diagnosis not present

## 2015-06-11 DIAGNOSIS — E119 Type 2 diabetes mellitus without complications: Secondary | ICD-10-CM | POA: Diagnosis present

## 2015-06-11 DIAGNOSIS — J45909 Unspecified asthma, uncomplicated: Secondary | ICD-10-CM | POA: Diagnosis present

## 2015-06-11 LAB — CBC
HEMATOCRIT: 33.3 % — AB (ref 36.0–46.0)
Hemoglobin: 11.3 g/dL — ABNORMAL LOW (ref 12.0–15.0)
MCH: 28.9 pg (ref 26.0–34.0)
MCHC: 33.9 g/dL (ref 30.0–36.0)
MCV: 85.2 fL (ref 78.0–100.0)
PLATELETS: 218 10*3/uL (ref 150–400)
RBC: 3.91 MIL/uL (ref 3.87–5.11)
RDW: 12.5 % (ref 11.5–15.5)
WBC: 8.2 10*3/uL (ref 4.0–10.5)

## 2015-06-11 LAB — BASIC METABOLIC PANEL
Anion gap: 9 (ref 5–15)
BUN: 32 mg/dL — AB (ref 6–20)
CHLORIDE: 100 mmol/L — AB (ref 101–111)
CO2: 27 mmol/L (ref 22–32)
CREATININE: 1.37 mg/dL — AB (ref 0.44–1.00)
Calcium: 9.1 mg/dL (ref 8.9–10.3)
GFR calc Af Amer: 44 mL/min — ABNORMAL LOW (ref >60)
GFR calc non Af Amer: 38 mL/min — ABNORMAL LOW (ref >60)
Glucose, Bld: 495 mg/dL — ABNORMAL HIGH (ref 65–99)
POTASSIUM: 3.9 mmol/L (ref 3.5–5.1)
SODIUM: 136 mmol/L (ref 135–145)

## 2015-06-11 LAB — D-DIMER, QUANTITATIVE (NOT AT ARMC): D DIMER QUANT: 1.65 ug{FEU}/mL — AB (ref 0.00–0.50)

## 2015-06-11 LAB — I-STAT TROPONIN, ED: TROPONIN I, POC: 0.01 ng/mL (ref 0.00–0.08)

## 2015-06-11 MED ORDER — ASPIRIN 81 MG PO CHEW
324.0000 mg | CHEWABLE_TABLET | Freq: Once | ORAL | Status: AC
  Administered 2015-06-11: 324 mg via ORAL
  Filled 2015-06-11: qty 4

## 2015-06-11 MED ORDER — IOPAMIDOL (ISOVUE-370) INJECTION 76%
INTRAVENOUS | Status: AC
  Filled 2015-06-11: qty 100

## 2015-06-11 MED ORDER — SODIUM CHLORIDE 0.9 % IV BOLUS (SEPSIS)
500.0000 mL | Freq: Once | INTRAVENOUS | Status: AC
Start: 2015-06-11 — End: 2015-06-11
  Administered 2015-06-11: 500 mL via INTRAVENOUS

## 2015-06-11 MED ORDER — DIPHENHYDRAMINE HCL 50 MG/ML IJ SOLN
25.0000 mg | Freq: Once | INTRAMUSCULAR | Status: AC
  Administered 2015-06-11: 25 mg via INTRAVENOUS
  Filled 2015-06-11: qty 1

## 2015-06-11 NOTE — ED Provider Notes (Signed)
CSN: [id number]     Arrival date & time 06/11/15  1602 History   First MD Initiated Contact with Patient 06/11/15 1921     Chief Complaint  Patient presents with  . Chest Pain   Patient is a 70 y.o. female presenting with chest pain. The history is provided by the patient.  Chest Pain Pain location:  R chest and substernal area Pain quality: sharp and stabbing   Radiates to: entire chest. Pain severity:  Severe Onset quality:  Gradual Duration:  2 months Timing:  Intermittent Progression:  Waxing and waning Chronicity:  New Relieved by:  Nothing Worsened by:  Exertion and movement Ineffective treatments:  None tried Associated symptoms: nausea and shortness of breath   Associated symptoms: no abdominal pain, no altered mental status, no cough, no fever and not vomiting   Shortness of breath:    Severity:  Severe   Onset quality:  Gradual   Duration:  2 months   Timing:  Intermittent   Progression:  Worsening Risk factors: diabetes mellitus and hypertension     Past Medical History  Diagnosis Date  . Diabetes mellitus   . Hypertension   . Asthma   . Diabetic neuropathy ([place])   . Seasonal allergies   . Obesity    History reviewed. No pertinent past surgical history. History reviewed. No pertinent family history. Social History  Substance Use Topics  . Smoking status: Former Research scientist (life sciences)  . Smokeless tobacco: Never Used  . Alcohol Use: No   OB History    No data available     Review of Systems  Constitutional: Negative for fever.  HENT: Negative for congestion.   Eyes: Negative for redness.  Respiratory: Positive for shortness of breath. Negative for cough.   Cardiovascular: Positive for chest pain.  Gastrointestinal: Positive for nausea. Negative for vomiting and abdominal pain.  Genitourinary: Negative for flank pain.  Musculoskeletal: Negative for neck stiffness.  Skin: Negative for rash.  Neurological: Negative for speech difficulty.      Allergies   Other  Home Medications   Prior to Admission medications   Medication Sig Start Date End Date Taking? Authorizing Provider  acetaminophen (TYLENOL) 325 MG tablet Take 2 tablets (650 mg total) by mouth every 6 (six) hours as needed for mild pain (or Fever >/= 101). 09/25/14   Robbie Lis, MD  albuterol (PROVENTIL HFA;VENTOLIN HFA) 108 (90 BASE) MCG/ACT inhaler Inhale 2 puffs into the lungs every 6 (six) hours as needed for wheezing or shortness of breath.    Historical Provider, MD  amLODipine (NORVASC) 5 MG tablet Take 1 tablet (5 mg total) by mouth daily. 09/28/14   Robbie Lis, MD  beclomethasone (QVAR) 80 MCG/ACT inhaler Inhale 2-4 puffs into the lungs every 4 (four) hours as needed (SOB).  01/22/12   Virgel Manifold, MD  cetirizine (ZYRTEC) 10 MG tablet Take 0.5 tablets (5 mg total) by mouth daily. 10/27/11   Charlann Lange, PA-C  Cholecalciferol (VITAMIN D) 2000 UNITS CAPS Take 2,000 Units by mouth daily.    Historical Provider, MD  ciprofloxacin (CIPRO) 500 MG tablet Take 1 tablet (500 mg total) by mouth 2 (two) times daily. Patient not taking: Reported on 02/05/2015 09/28/14   Robbie Lis, MD  gabapentin (NEURONTIN) 300 MG capsule Take 1 capsule (300 mg total) by mouth 3 (three) times daily. 01/22/12   Virgel Manifold, MD  glipiZIDE (GLUCOTROL XL) 10 MG 24 hr tablet Take 1 tablet (10 mg total) by mouth every morning.  Patient taking differently: Take 10 mg by mouth 2 (two) times daily.  04/15/13   Domenic Polite, MD  hydrOXYzine (ATARAX/VISTARIL) 10 MG tablet Take 10 mg by mouth 2 (two) times daily as needed. For itching. 11/24/14   Historical Provider, MD  hyoscyamine (LEVSIN/SL) 0.125 MG SL tablet Place 1 tablet (0.125 mg total) under the tongue every 4 (four) hours as needed. 02/05/15   April Palumbo, MD  ibuprofen (ADVIL,MOTRIN) 200 MG tablet Take 200 mg by mouth every 6 (six) hours as needed for headache, mild pain or moderate pain.    Historical Provider, MD  Insulin Glargine (LANTUS  SOLOSTAR) 100 UNIT/ML Solostar Pen Inject 20 Units into the skin every morning. 09/25/14   Robbie Lis, MD  lisinopril (PRINIVIL,ZESTRIL) 10 MG tablet Take 10 mg by mouth at bedtime. 11/24/14   Historical Provider, MD  metFORMIN (GLUCOPHAGE) 1000 MG tablet Take 1,000 mg by mouth 2 (two) times daily. 11/24/14   Historical Provider, MD  metroNIDAZOLE (FLAGYL) 500 MG tablet Take 1 tablet (500 mg total) by mouth 3 (three) times daily. Patient not taking: Reported on 02/05/2015 09/28/14   Robbie Lis, MD  ondansetron [facility] ODT) 8 MG disintegrating tablet 8mg  ODT q8 hours prn nausea 02/05/15   April Palumbo, MD   BP 149/81 mmHg  Pulse 96  Temp(Src) 97.7 F (36.5 C) (Oral)  Resp 17  SpO2 96% Physical Exam  Constitutional: She is oriented to person, place, and time. She appears well-developed and well-nourished. She is cooperative. No distress.  HENT:  Head: Normocephalic and atraumatic.  Right Ear: External ear normal.  Left Ear: External ear normal.  Neck: Normal range of motion and phonation normal.  Cardiovascular: Normal rate and regular rhythm.   Pulmonary/Chest: Effort normal and breath sounds normal. No respiratory distress. She has no wheezes. She has no rales.  Chest pain is not reproducible  Abdominal: Soft. Normal appearance. She exhibits no distension. There is no tenderness. There is no rebound and no guarding.  Neurological: She is alert and oriented to person, place, and time.  Skin: Skin is warm and dry. No rash noted. She is not diaphoretic.  Psychiatric: She has a normal mood and affect.  Vitals reviewed.   ED Course  Procedures (including critical care time) Labs Review Labs Reviewed  BASIC METABOLIC PANEL - Abnormal; Notable for the following:    Chloride 100 (*)    Glucose, Bld 495 (*)    BUN 32 (*)    Creatinine, Ser 1.37 (*)    GFR calc non Af Amer 38 (*)    GFR calc Af Amer 44 (*)    All other components within normal limits  CBC - Abnormal; Notable for  the following:    Hemoglobin 11.3 (*)    HCT 33.3 (*)    All other components within normal limits  I-STAT TROPOININ, ED    Imaging Review Dg Chest 2 View  06/11/2015  CLINICAL DATA:  One month history of chest pain, shortness of breath, increased pain with inspiration, history hypertension, diabetes mellitus, asthma, former smoker EXAM: CHEST  2 VIEW COMPARISON:  02/05/2015 FINDINGS: Upper normal heart size. Atherosclerotic calcification and elongation of thoracic aorta. Mediastinal contours and pulmonary vascularity otherwise normal. Mild chronic elevation of RIGHT diaphragm with minimal RIGHT basilar atelectasis. Lungs otherwise clear. No definite infiltrate, pleural effusion, or pneumothorax. Bones demineralized. IMPRESSION: Minimal RIGHT basilar atelectasis without acute infiltrate. Electronically Signed   By: Lavonia Dana M.D.   On: 06/11/2015 17:03  I have personally reviewed and evaluated these images and lab results as part of my medical decision-making.   EKG Interpretation   Date/Time:  Monday June 11 2015 16:14:25 EDT Ventricular Rate:  98 PR Interval:  136 QRS Duration: 86 QT Interval:  346 QTC Calculation: 441 R Axis:   23 Text Interpretation:  Normal sinus rhythm Low voltage QRS Borderline ECG  No acute changes No significant change since last tracing Confirmed by  Kathrynn Humble, MD, Thelma Comp [phone]) on 06/11/2015 9:30:13 PM      MDM   Final diagnoses:  Chest pain, unspecified chest pain type    70 y.o. female with a past medical history of HTN, DM, asthma presents to the ED due to intermittent worsening chest pain for the last 2 months. Remainder of HPI, ROS, and Exam as above.   Well appearing on exam, AFVSS.   Labs reviewed by me. CBC unremarkable. BMP notable for elevated glucose. Troponin not elevated. Dimer elevated, will obtain CTA chest. CXR showed right basilar atelectasis.   CTA showed no PE.   Aspirin given.   Discussed her case with the hospitalist.  Admitted for further evaluation and management of her chest pain.   Case managed in conjunction with my attending, Dr. Kathrynn Humble.   Berenice Primas, MD 06/12/15 [id number]  Varney Biles, MD 06/13/15 [id number]

## 2015-06-11 NOTE — ED Notes (Signed)
Pt transported to CT ?

## 2015-06-11 NOTE — ED Notes (Signed)
MD at bedside. 

## 2015-06-11 NOTE — ED Notes (Signed)
Pt c/o 1 month history of CP, SOB. Pain increased with inspiration. Denies cough, fevers, n/v, dizziness. She is alert and breathing easily

## 2015-06-12 DIAGNOSIS — I10 Essential (primary) hypertension: Secondary | ICD-10-CM | POA: Diagnosis not present

## 2015-06-12 DIAGNOSIS — R079 Chest pain, unspecified: Secondary | ICD-10-CM | POA: Diagnosis not present

## 2015-06-12 DIAGNOSIS — E1122 Type 2 diabetes mellitus with diabetic chronic kidney disease: Secondary | ICD-10-CM | POA: Diagnosis not present

## 2015-06-12 DIAGNOSIS — I129 Hypertensive chronic kidney disease with stage 1 through stage 4 chronic kidney disease, or unspecified chronic kidney disease: Secondary | ICD-10-CM | POA: Diagnosis not present

## 2015-06-12 DIAGNOSIS — J4530 Mild persistent asthma, uncomplicated: Secondary | ICD-10-CM

## 2015-06-12 DIAGNOSIS — N183 Chronic kidney disease, stage 3 (moderate): Secondary | ICD-10-CM | POA: Diagnosis not present

## 2015-06-12 DIAGNOSIS — J45909 Unspecified asthma, uncomplicated: Secondary | ICD-10-CM | POA: Diagnosis present

## 2015-06-12 DIAGNOSIS — R0789 Other chest pain: Secondary | ICD-10-CM | POA: Diagnosis not present

## 2015-06-12 LAB — GLUCOSE, CAPILLARY
GLUCOSE-CAPILLARY: 362 mg/dL — AB (ref 65–99)
GLUCOSE-CAPILLARY: 266 mg/dL — AB (ref 65–99)
Glucose-Capillary: 197 mg/dL — ABNORMAL HIGH (ref 65–99)
Glucose-Capillary: 222 mg/dL — ABNORMAL HIGH (ref 65–99)
Glucose-Capillary: 297 mg/dL — ABNORMAL HIGH (ref 65–99)

## 2015-06-12 LAB — URINALYSIS, ROUTINE W REFLEX MICROSCOPIC
Bilirubin Urine: NEGATIVE
Glucose, UA: >1000 mg/dL — AB
Hgb urine dipstick: NEGATIVE
Ketones, ur: NEGATIVE mg/dL
LEUKOCYTES UA: NEGATIVE
Nitrite: NEGATIVE
PROTEIN: NEGATIVE mg/dL
Specific Gravity, Urine: 1.022 (ref 1.005–1.030)
pH: 6.5 (ref 5.0–8.0)

## 2015-06-12 LAB — LIPID PANEL
CHOL/HDL RATIO: 5.2 ratio
CHOLESTEROL: 192 mg/dL (ref 0–200)
HDL: 37 mg/dL — ABNORMAL LOW (ref >40)
LDL Cholesterol: 123 mg/dL — ABNORMAL HIGH (ref 0–99)
TRIGLYCERIDES: 160 mg/dL — AB (ref <150)
VLDL: 32 mg/dL (ref 0–40)

## 2015-06-12 LAB — CBG MONITORING, ED: Glucose-Capillary: 287 mg/dL — ABNORMAL HIGH (ref 65–99)

## 2015-06-12 LAB — URINE MICROSCOPIC-ADD ON

## 2015-06-12 LAB — TROPONIN I
Troponin I: <0.03 ng/mL (ref <0.031)
Troponin I: <0.03 ng/mL (ref <0.031)

## 2015-06-12 MED ORDER — ONDANSETRON HCL 4 MG/2ML IJ SOLN
4.0000 mg | Freq: Four times a day (QID) | INTRAMUSCULAR | Status: DC | PRN
Start: 2015-06-12 — End: 2015-06-14

## 2015-06-12 MED ORDER — INSULIN ASPART 100 UNIT/ML ~~LOC~~ SOLN
0.0000 [IU] | SUBCUTANEOUS | Status: DC
  Administered 2015-06-12: 9 [IU] via SUBCUTANEOUS
  Administered 2015-06-12: 5 [IU] via SUBCUTANEOUS
  Administered 2015-06-12: 3 [IU] via SUBCUTANEOUS
  Administered 2015-06-12 (×2): 5 [IU] via SUBCUTANEOUS
  Administered 2015-06-13: 2 [IU] via SUBCUTANEOUS
  Administered 2015-06-13: 1 [IU] via SUBCUTANEOUS
  Administered 2015-06-13: 3 [IU] via SUBCUTANEOUS
  Administered 2015-06-13: 7 [IU] via SUBCUTANEOUS
  Administered 2015-06-13: 2 [IU] via SUBCUTANEOUS
  Administered 2015-06-13: 3 [IU] via SUBCUTANEOUS
  Administered 2015-06-14 (×2): 2 [IU] via SUBCUTANEOUS
  Administered 2015-06-14: 1 [IU] via SUBCUTANEOUS
  Administered 2015-06-14: 5 [IU] via SUBCUTANEOUS
  Filled 2015-06-12: qty 1

## 2015-06-12 MED ORDER — ACETAMINOPHEN 325 MG PO TABS: 650.0000 mg | ORAL_TABLET | ORAL | Status: DC | PRN

## 2015-06-12 MED ORDER — GI COCKTAIL ~~LOC~~: 30.0000 mL | Freq: Four times a day (QID) | ORAL | Status: DC | PRN

## 2015-06-12 MED ORDER — ENOXAPARIN SODIUM 80 MG/0.8ML ~~LOC~~ SOLN
1.0000 mg/kg | Freq: Two times a day (BID) | SUBCUTANEOUS | Status: DC
  Administered 2015-06-12 – 2015-06-13 (×2): 65 mg via SUBCUTANEOUS
  Filled 2015-06-12 (×2): qty 0.8

## 2015-06-12 MED ORDER — IPRATROPIUM-ALBUTEROL 0.5-2.5 (3) MG/3ML IN SOLN: 3.0000 mL | RESPIRATORY_TRACT | Status: DC | PRN

## 2015-06-12 MED ORDER — HYDROXYZINE HCL 10 MG PO TABS: 10.0000 mg | ORAL_TABLET | Freq: Two times a day (BID) | ORAL | Status: DC | PRN

## 2015-06-12 MED ORDER — ENOXAPARIN SODIUM 80 MG/0.8ML ~~LOC~~ SOLN
1.0000 mg/kg | Freq: Once | SUBCUTANEOUS | Status: DC
  Filled 2015-06-12: qty 0.8

## 2015-06-12 MED ORDER — LORATADINE 10 MG PO TABS
10.0000 mg | ORAL_TABLET | Freq: Every day | ORAL | Status: DC
  Administered 2015-06-12 – 2015-06-14 (×3): 10 mg via ORAL
  Filled 2015-06-12 (×4): qty 1

## 2015-06-12 MED ORDER — GABAPENTIN 300 MG PO CAPS
300.0000 mg | ORAL_CAPSULE | Freq: Three times a day (TID) | ORAL | Status: DC
  Administered 2015-06-12 – 2015-06-14 (×6): 300 mg via ORAL
  Filled 2015-06-12 (×7): qty 1

## 2015-06-12 MED ORDER — ALBUTEROL SULFATE HFA 108 (90 BASE) MCG/ACT IN AERS: 2.0000 | INHALATION_SPRAY | Freq: Four times a day (QID) | RESPIRATORY_TRACT | Status: DC | PRN

## 2015-06-12 MED ORDER — INSULIN GLARGINE 100 UNIT/ML ~~LOC~~ SOLN
10.0000 [IU] | Freq: Every morning | SUBCUTANEOUS | Status: DC
  Filled 2015-06-12: qty 0.1

## 2015-06-12 MED ORDER — MORPHINE SULFATE (PF) 2 MG/ML IV SOLN: 1.0000 mg | INTRAVENOUS | Status: DC | PRN

## 2015-06-12 MED ORDER — BUDESONIDE 0.5 MG/2ML IN SUSP
0.5000 mg | Freq: Two times a day (BID) | RESPIRATORY_TRACT | Status: DC
  Administered 2015-06-12 – 2015-06-14 (×5): 0.5 mg via RESPIRATORY_TRACT
  Filled 2015-06-12 (×5): qty 2

## 2015-06-12 MED ORDER — INSULIN STARTER KIT- PEN NEEDLES (ENGLISH)
1.0000 | Freq: Once | Status: AC
  Administered 2015-06-12: 1
  Filled 2015-06-12: qty 1

## 2015-06-12 MED ORDER — IPRATROPIUM-ALBUTEROL 0.5-2.5 (3) MG/3ML IN SOLN
3.0000 mL | Freq: Four times a day (QID) | RESPIRATORY_TRACT | Status: DC
  Administered 2015-06-12 (×2): 3 mL via RESPIRATORY_TRACT
  Filled 2015-06-12 (×2): qty 3

## 2015-06-12 MED ORDER — SODIUM CHLORIDE 0.9 % IV SOLN
INTRAVENOUS | Status: DC
  Administered 2015-06-12: 02:00:00 via INTRAVENOUS

## 2015-06-12 MED ORDER — AMLODIPINE BESYLATE 5 MG PO TABS
5.0000 mg | ORAL_TABLET | Freq: Every day | ORAL | Status: DC
Start: 2015-06-12 — End: 2015-06-14
  Administered 2015-06-12 – 2015-06-14 (×3): 5 mg via ORAL
  Filled 2015-06-12 (×2): qty 1
  Filled 2015-06-12: qty 2
  Filled 2015-06-12: qty 1

## 2015-06-12 MED ORDER — VITAMIN D 1000 UNITS PO TABS
2000.0000 [IU] | ORAL_TABLET | Freq: Every day | ORAL | Status: DC
  Administered 2015-06-12 – 2015-06-14 (×3): 2000 [IU] via ORAL
  Filled 2015-06-12 (×4): qty 2

## 2015-06-12 MED ORDER — HYOSCYAMINE SULFATE 0.125 MG SL SUBL: 0.1250 mg | SUBLINGUAL_TABLET | SUBLINGUAL | Status: DC | PRN

## 2015-06-12 MED ORDER — INSULIN GLARGINE 100 UNIT/ML ~~LOC~~ SOLN
15.0000 [IU] | Freq: Every day | SUBCUTANEOUS | Status: DC
  Administered 2015-06-12 – 2015-06-13 (×2): 15 [IU] via SUBCUTANEOUS
  Filled 2015-06-12 (×3): qty 0.15

## 2015-06-12 NOTE — Progress Notes (Signed)
Pt. Arrived to unit from ED in alert and stable condition.  No s/s of distress noted. Pt. Non-English speaking. Son at bedside and refusing bed alarm. Family verbalized understanding of fall safety, prevention. Pt. Placed on tele. Call light placed within reach. RN will continue to monitor pt. For changes in condition. Brockton Mckesson, Katherine Roan

## 2015-06-12 NOTE — Progress Notes (Signed)
PROGRESS NOTE  Nichole Munoz CZY:[id number] DOB: [birth year] DOA: 06/11/2015 PCP: [facility]  HPI/Recap of past 24 hours: 69 [place] F with history of HTN, DM not controlled, obesity admitted with a month of chest pain. She continues to have discomfort, troponins are negative.   Assessment/Plan: Chest pain: Acute on chronic. Patient reports anywhere from 1 to 2 month history of chest pain symptoms. On evaluation patient found to have negative troponin with EKG showing no significant ST-T wave changes.  - Admit to a telemetry bed - Check echocardiogram - given obesity, DM, and never been evaluated, will obtain nuclear stress in AM - consider need to consult cardiology depending on study results  Elevated d-dimer: D-dimer elevated at 1.65 on admission, but patient appears to have had chronically elevated d-dimer as in the past. CT angiogram showed no acute signs for a pulmonary embolus.   Abdominal pain: Could be secondary to patient's uncontrolled diabetes - Continue to monitor  Diabetes mellitus type 2 with hyperglycemia: Blood glucose elevated at 495 on admission. Previous hemoglobin A1c was noted to be 8 back in 09/2014. - Check urinalysis - Check hemoglobin A1c, pending - Held glipizide and metformin - Lantus 10 units subcutaneous daily - CBGs every 4 hours with sensitive sliding scale insulin for now - Continue gabapentin for neuropathy - Diabetes education in a.m. - she likely needs to be on insluin, but has refused in the past  Chronic kidney disease stage III: Patient's initial creatinine 1.37 with BUN of 32 suggest a prerenal state. - Follow repeat CMP in am  Essential hypertension - Continue amlodipine - Held lisinopril   Asthma  - Continue Qvar - DuoNeb prn SOB/wheezing    Anemia: hemoglobin on admission 11.3 - Continue to monitor   Code Status: FULL   Family Communication: Family at bedside, translating   Disposition Plan: Likely  to home in 1-2 days.    Consultants:  None   Procedures:  Echo 6/6 and plan NM Stress 6/7   Antimicrobials:  None    Objective: Filed Vitals:   06/12/15 0313 06/12/15 0315 06/12/15 0827 06/12/15 1157  BP: 152/139 150/87  146/91  Pulse: 84   91  Temp: 97.8 F (36.6 C)     TempSrc: Oral     Resp: 20     Height:      Weight:  63.322 kg (139 lb 9.6 oz)    SpO2: 97% 97% 98% 100%    Intake/Output Summary (Last 24 hours) at 06/12/15 1208 Last data filed at 06/12/15 0931  Gross per 24 hour  Intake 538.33 ml  Output    450 ml  Net  88.33 ml   Filed Weights   06/12/15 0159 06/12/15 0315  Weight: 63.52 kg (140 lb 0.6 oz) 63.322 kg (139 lb 9.6 oz)    Exam: General:  Alert, oriented, calm, in no acute distress, chest is TTP Eyes: pupils round and reactive to light and accomodation, clear sclerea Neck: supple, no masses, trachea mildline  Cardiovascular: RRR, no murmurs or rubs, no peripheral edema  Respiratory: clear to auscultation bilaterally, no wheezes, no crackles  Abdomen: soft, nontender, nondistended, normal bowel tones heard  Skin: dry, no rashes  Musculoskeletal: no joint effusions, normal range of motion  Psychiatric: appropriate affect, normal speech  Neurologic: extraocular muscles intact, clear speech, moving all extremities with intact sensorium    Data Reviewed: CBC:  Recent Labs Lab 06/11/15 1629  WBC 8.2  HGB 11.3*  HCT 33.3*  MCV 85.2  PLT 191   Basic Metabolic Panel:  Recent Labs Lab 06/11/15 1629  NA 136  K 3.9  CL 100*  CO2 27  GLUCOSE 495*  BUN 32*  CREATININE 1.37*  CALCIUM 9.1   GFR: Estimated Creatinine Clearance: 35.3 mL/min (by C-G formula based on Cr of 1.37). Liver Function Tests: No results for input(s): AST, ALT, ALKPHOS, BILITOT, PROT, ALBUMIN in the last 168 hours. No results for input(s): LIPASE, AMYLASE in the last 168 hours. No results for input(s): AMMONIA in the last 168 hours. Coagulation Profile: No  results for input(s): INR, PROTIME in the last 168 hours. Cardiac Enzymes:  Recent Labs Lab 06/12/15 0131 06/12/15 0414 06/12/15 0818  TROPONINI <0.03 <0.03 <0.03   BNP (last 3 results) No results for input(s): PROBNP in the last 8760 hours. HbA1C: No results for input(s): HGBA1C in the last 72 hours. CBG:  Recent Labs Lab 06/12/15 0206 06/12/15 0621 06/12/15 0821 06/12/15 1145  GLUCAP 287* 197* 222* 362*   Lipid Profile:  Recent Labs  06/12/15 0414  CHOL 192  HDL 37*  LDLCALC 123*  TRIG 160*  CHOLHDL 5.2   Thyroid Function Tests: No results for input(s): TSH, T4TOTAL, FREET4, T3FREE, THYROIDAB in the last 72 hours. Anemia Panel: No results for input(s): VITAMINB12, FOLATE, FERRITIN, TIBC, IRON, RETICCTPCT in the last 72 hours. Urine analysis:    Component Value Date/Time   COLORURINE YELLOW 02/05/2015 0422   APPEARANCEUR CLEAR 02/05/2015 0422   LABSPEC 1.024 02/05/2015 0422   PHURINE 5.5 02/05/2015 0422   GLUCOSEU 250* 02/05/2015 0422   HGBUR NEGATIVE 02/05/2015 0422   BILIRUBINUR SMALL* 02/05/2015 0422   KETONESUR NEGATIVE 02/05/2015 0422   PROTEINUR NEGATIVE 02/05/2015 0422   UROBILINOGEN 1.0 09/23/2014 1458   NITRITE NEGATIVE 02/05/2015 0422   LEUKOCYTESUR SMALL* 02/05/2015 0422   Sepsis Labs: '@LABRCNTIP'$ (procalcitonin:4,lacticidven:4)  )No results found for this or any previous visit (from the past 240 hour(s)).    Studies: Dg Chest 2 View  06/11/2015  CLINICAL DATA:  One month history of chest pain, shortness of breath, increased pain with inspiration, history hypertension, diabetes mellitus, asthma, former smoker EXAM: CHEST  2 VIEW COMPARISON:  02/05/2015 FINDINGS: Upper normal heart size. Atherosclerotic calcification and elongation of thoracic aorta. Mediastinal contours and pulmonary vascularity otherwise normal. Mild chronic elevation of RIGHT diaphragm with minimal RIGHT basilar atelectasis. Lungs otherwise clear. No definite infiltrate,  pleural effusion, or pneumothorax. Bones demineralized. IMPRESSION: Minimal RIGHT basilar atelectasis without acute infiltrate. Electronically Signed   By: Lavonia Dana M.D.   On: 06/11/2015 17:03   Ct Angio Chest Pe W/cm &/or Wo Cm  06/11/2015  CLINICAL DATA:  Dyspnea with sharp chest pain and elevated D-dimer. EXAM: CT ANGIOGRAPHY CHEST WITH CONTRAST TECHNIQUE: Multidetector CT imaging of the chest was performed using the standard protocol during bolus administration of intravenous contrast. Multiplanar CT image reconstructions and MIPs were obtained to evaluate the vascular anatomy. CONTRAST:  59 cc Isovue 370 intravenous COMPARISON:  05/28/2007 FINDINGS: THORACIC INLET/BODY WALL: No acute abnormality. MEDIASTINUM: Cardiomegaly and probable left ventricular hypertrophy. No pericardial effusion. Non-opacified systemic arterial circulation without evidence of acute disease. Great vessels are tortuous and there is atherosclerosis. No noted coronary artery calcification. There is new hilar and mediastinal adenopathy with coarse calcifications. LUNG WINDOWS: Diffuse airway thickening. Patchy air trapping. There is no edema, consolidation, effusion, or pneumothorax. Eventration of the right diaphragm. UPPER ABDOMEN: No acute findings. OSSEOUS: No acute fracture.  No suspicious lytic or blastic lesions. Review of  the MIP images confirms the above findings. IMPRESSION: 1. No evidence of pulmonary embolism. 2. Bronchial wall thickening and patchy air trapping presumably related to patient's history of asthma. 3. Adenopathy with granulomatous type calcification that has developed since 2009 comparison. Electronically Signed   By: Monte Fantasia M.D.   On: 06/11/2015 23:32    Scheduled Meds: . amLODipine  5 mg Oral Daily  . budesonide  0.5 mg Inhalation BID  . cholecalciferol  2,000 Units Oral Daily  . enoxaparin (LOVENOX) injection  1 mg/kg Subcutaneous Q12H  . gabapentin  300 mg Oral TID  . insulin aspart  0-9  Units Subcutaneous Q4H  . insulin glargine  10 Units Subcutaneous q morning - 10a  . insulin starter kit- pen needles  1 kit Other Once  . loratadine  10 mg Oral Daily    Continuous Infusions: . sodium chloride Stopped (06/12/15 1150)       Time spent: 21 min  Codie Hainer Marry Guan, MD [facility] Pager [phone]  If 7PM-7AM, please contact night-coverage [email or web address] Password [email or web address] 06/12/2015, 12:08 PM

## 2015-06-12 NOTE — ED Notes (Signed)
CBG 287  

## 2015-06-12 NOTE — Progress Notes (Signed)
Inpatient Diabetes Program Recommendations  AACE/ADA: New Consensus Statement on Inpatient Glycemic Control (2015)  Target Ranges:  Prepandial:   less than 140 mg/dL      Peak postprandial:   less than 180 mg/dL (1-2 hours)      Critically ill patients:  140 - 180 mg/dL  Results for NOUNFrancely, Nichole Munoz (MRN [id number]) as of 06/12/2015 14:48  Ref. Range 06/12/2015 02:06 06/12/2015 06:21 06/12/2015 08:21 06/12/2015 11:45  Glucose-Capillary Latest Ref Range: 65-99 mg/dL 287 (H) 197 (H) 222 (H) 362 (H)    Review of Glycemic Control  Diabetes history: DM2 Outpatient Diabetes medications: Glipizide 10 mg BID, Metformin 1000 mg BID (home medication list has Lantus 20 units QAM but pt states she is not taking any insulin and has never taken insulin) Current orders for Inpatient glycemic control: Lantus 10 units QAM @ 10 am, Novolog 0-9 units Q4H  Inpatient Diabetes Program Recommendations: Insulin - Basal: Note patient was ordered Lantus 10 units this morning at 1:30 but patient has not received any Lantus since being admitted. Glucose is up to 362 mg/dl at 11:45 today. Recommend changing frequency of Lantus to Q24H starting now and recommend that dosage of Lantus be increased to 15 units (based on 63 kg x 0.25 units).    NOTE: Went to speak with patient regarding diabetes and her daughter is with her at this time. Patient has limited ability to understand English and her daughter has limited ablily to understand [place]. Patient's daughter reports that patient takes 2 different pills for diabetes but she does not take any insulin and has never taken any insulin. Informed patient and her family that education material will be ordered and diabetes coordinator will come back tomorrow morning and use an interpreter to communicate effectively. Ordered: Living Well with Diabetes booklet, patient education videos, and insulin starter kit. Asked Deneise Lever, RN to begin working with patient to educate about insulin and teaching about  insulin administration.  Thanks, Barnie Alderman, RN, MSN, CDE Diabetes Coordinator Inpatient Diabetes Program [phone] (Team Pager from [place] to [place]) [phone] ([facility] office) [phone] [facility] office) [phone] [facility] office)

## 2015-06-12 NOTE — H&P (Signed)
History and Physical    Nichole Munoz [id number] DOB: January 19, 1945 DOA: 06/11/2015  Referring MD/NP/PA: Dr. Berenice Primas resident PCP: [place]  Patient coming from: Home  Chief Complaint: Chest pain  HPI: Nichole Munoz is a 70 y.o. female with medical history significant of HTN, diabetes mellitus type 2 complicated by neuropathy, obesity, and asthma; who presents with complaints of chest pain. History is obtained with the assistance of the son translating as the patient speaks [place]. Patient is somewhat of a poor historian. Symptoms have been ongoing over the last 1-2 months. Pain is described as sharp, constant, and located underneath the right breast and across the chest.  She is unsure of what worsens/relieves symptoms. Associated symptoms include some reports of shortness of breath, right-sided abdominal pain, urinary frequency, numbness in lower extremities, and mild shortness of breath. Patient denies any orthopnea, headache, fever, chills, diarrhea, or lower leg swelling. Patient states that she does not like to inject herself with insulin, and therefore so only takes the oral medications. Reports checking blood sugars 2-3 times daily and complaints of numbness and tingling in her feet.   ED Course: Upon admission patient was seen to to be afebrile, respirations up to 24, blood pressure up to 160/86, pulse within normal limits, and O2 saturations maintained. Lab work revealed hemoglobin 11.3, chloride 100, CO2 27, BUN 32, creatinine 1.37, glucose 495, d-dimer 1.65, and troponin 0.01. CT angiogram of the chest showed no acute signs of a PE.   Review of Systems: As per HPI otherwise 10 point review of systems negative.   Past Medical History  Diagnosis Date  . Diabetes mellitus   . Hypertension   . Asthma   . Diabetic neuropathy ([place])   . Seasonal allergies   . Obesity     History reviewed. No pertinent past surgical history.   reports that she has quit  smoking. She has never used smokeless tobacco. She reports that she does not drink alcohol or use illicit drugs.  Allergies  Allergen Reactions  . Other Other (See Comments)    Chlorox.  Cannot tolerate smell. And Chicken    History reviewed. No pertinent family history.  Prior to Admission medications   Medication Sig Start Date End Date Taking? Authorizing Provider  acetaminophen (TYLENOL) 325 MG tablet Take 2 tablets (650 mg total) by mouth every 6 (six) hours as needed for mild pain (or Fever >/= 101). 09/25/14   Robbie Lis, MD  albuterol (PROVENTIL HFA;VENTOLIN HFA) 108 (90 BASE) MCG/ACT inhaler Inhale 2 puffs into the lungs every 6 (six) hours as needed for wheezing or shortness of breath.    Historical Provider, MD  amLODipine (NORVASC) 5 MG tablet Take 1 tablet (5 mg total) by mouth daily. 09/28/14   Robbie Lis, MD  beclomethasone (QVAR) 80 MCG/ACT inhaler Inhale 2-4 puffs into the lungs every 4 (four) hours as needed (SOB).  01/22/12   Virgel Manifold, MD  cetirizine (ZYRTEC) 10 MG tablet Take 0.5 tablets (5 mg total) by mouth daily. 10/27/11   Charlann Lange, PA-C  Cholecalciferol (VITAMIN D) 2000 UNITS CAPS Take 2,000 Units by mouth daily.    Historical Provider, MD  ciprofloxacin (CIPRO) 500 MG tablet Take 1 tablet (500 mg total) by mouth 2 (two) times daily. Patient not taking: Reported on 02/05/2015 09/28/14   Robbie Lis, MD  gabapentin (NEURONTIN) 300 MG capsule Take 1 capsule (300 mg total) by mouth 3 (three) times daily. 01/22/12   Annie Main  Kohut, MD  glipiZIDE (GLUCOTROL XL) 10 MG 24 hr tablet Take 1 tablet (10 mg total) by mouth every morning. Patient taking differently: Take 10 mg by mouth 2 (two) times daily.  04/15/13   Domenic Polite, MD  hydrOXYzine (ATARAX/VISTARIL) 10 MG tablet Take 10 mg by mouth 2 (two) times daily as needed. For itching. 11/24/14   Historical Provider, MD  hyoscyamine (LEVSIN/SL) 0.125 MG SL tablet Place 1 tablet (0.125 mg total) under the tongue  every 4 (four) hours as needed. 02/05/15   April Palumbo, MD  ibuprofen (ADVIL,MOTRIN) 200 MG tablet Take 200 mg by mouth every 6 (six) hours as needed for headache, mild pain or moderate pain.    Historical Provider, MD  Insulin Glargine (LANTUS SOLOSTAR) 100 UNIT/ML Solostar Pen Inject 20 Units into the skin every morning. 09/25/14   Robbie Lis, MD  lisinopril (PRINIVIL,ZESTRIL) 10 MG tablet Take 10 mg by mouth at bedtime. 11/24/14   Historical Provider, MD  metFORMIN (GLUCOPHAGE) 1000 MG tablet Take 1,000 mg by mouth 2 (two) times daily. 11/24/14   Historical Provider, MD  metroNIDAZOLE (FLAGYL) 500 MG tablet Take 1 tablet (500 mg total) by mouth 3 (three) times daily. Patient not taking: Reported on 02/05/2015 09/28/14   Robbie Lis, MD  ondansetron [facility] ODT) 8 MG disintegrating tablet 8mg  ODT q8 hours prn nausea 02/05/15   April Palumbo, MD    Physical Exam: Filed Vitals:   06/11/15 2200 06/11/15 2215 06/11/15 2330 06/11/15 2345  BP: 160/86 155/144  156/83  Pulse: 84 86 90 84  Temp:      TempSrc:      Resp: 18 22 21 16   SpO2: 100% 100% 100% 100%      Constitutional: Elderly female in NAD, calm, comfortable Filed Vitals:   06/11/15 2200 06/11/15 2215 06/11/15 2330 06/11/15 2345  BP: 160/86 155/144  156/83  Pulse: 84 86 90 84  Temp:      TempSrc:      Resp: 18 22 21 16   SpO2: 100% 100% 100% 100%   Eyes: PERRL, lids and conjunctivae normal ENMT: Mucous membranes are dry . Posterior pharynx clear of any exudate or lesions.Normal dentition.  Neck: normal, supple, no masses, no thyromegaly Respiratory: clear to auscultation bilaterally, no wheezing, no crackles. Normal respiratory effort. No accessory muscle use.  Cardiovascular: Regular rate and rhythm, no murmurs / rubs / gallops. No extremity edema. 2+ pedal pulses. No carotid bruits.  Abdomen: no tenderness, no masses palpated. No hepatosplenomegaly. Bowel sounds positive.  Musculoskeletal: no clubbing / cyanosis. No  joint deformity upper and lower extremities. Good ROM, no contractures. Normal muscle tone.  Skin: no rashes, lesions, ulcers. No induration Neurologic: CN 2-12 grossly intact. Sensation intact, DTR normal. Strength 5/5 in all 4.  Psychiatric: Normal judgment and insight. Alert and oriented x 3. Normal mood.     Labs on Admission: I have personally reviewed following labs and imaging studies  CBC:  Recent Labs Lab 06/11/15 1629  WBC 8.2  HGB 11.3*  HCT 33.3*  MCV 85.2  PLT 99991111   Basic Metabolic Panel:  Recent Labs Lab 06/11/15 1629  NA 136  K 3.9  CL 100*  CO2 27  GLUCOSE 495*  BUN 32*  CREATININE 1.37*  CALCIUM 9.1   GFR: CrCl cannot be calculated (Unknown ideal weight.). Liver Function Tests: No results for input(s): AST, ALT, ALKPHOS, BILITOT, PROT, ALBUMIN in the last 168 hours. No results for input(s): LIPASE, AMYLASE in the last 168  hours. No results for input(s): AMMONIA in the last 168 hours. Coagulation Profile: No results for input(s): INR, PROTIME in the last 168 hours. Cardiac Enzymes: No results for input(s): CKTOTAL, CKMB, CKMBINDEX, TROPONINI in the last 168 hours. BNP (last 3 results) No results for input(s): PROBNP in the last 8760 hours. HbA1C: No results for input(s): HGBA1C in the last 72 hours. CBG: No results for input(s): GLUCAP in the last 168 hours. Lipid Profile: No results for input(s): CHOL, HDL, LDLCALC, TRIG, CHOLHDL, LDLDIRECT in the last 72 hours. Thyroid Function Tests: No results for input(s): TSH, T4TOTAL, FREET4, T3FREE, THYROIDAB in the last 72 hours. Anemia Panel: No results for input(s): VITAMINB12, FOLATE, FERRITIN, TIBC, IRON, RETICCTPCT in the last 72 hours. Urine analysis:    Component Value Date/Time   COLORURINE YELLOW 02/05/2015 0422   APPEARANCEUR CLEAR 02/05/2015 0422   LABSPEC 1.024 02/05/2015 0422   PHURINE 5.5 02/05/2015 0422   GLUCOSEU 250* 02/05/2015 0422   HGBUR NEGATIVE 02/05/2015 0422    BILIRUBINUR SMALL* 02/05/2015 0422   KETONESUR NEGATIVE 02/05/2015 0422   PROTEINUR NEGATIVE 02/05/2015 0422   UROBILINOGEN 1.0 09/23/2014 1458   NITRITE NEGATIVE 02/05/2015 0422   LEUKOCYTESUR SMALL* 02/05/2015 0422   Sepsis Labs: No results found for this or any previous visit (from the past 240 hour(s)).   Radiological Exams on Admission: Dg Chest 2 View  06/11/2015  CLINICAL DATA:  One month history of chest pain, shortness of breath, increased pain with inspiration, history hypertension, diabetes mellitus, asthma, former smoker EXAM: CHEST  2 VIEW COMPARISON:  02/05/2015 FINDINGS: Upper normal heart size. Atherosclerotic calcification and elongation of thoracic aorta. Mediastinal contours and pulmonary vascularity otherwise normal. Mild chronic elevation of RIGHT diaphragm with minimal RIGHT basilar atelectasis. Lungs otherwise clear. No definite infiltrate, pleural effusion, or pneumothorax. Bones demineralized. IMPRESSION: Minimal RIGHT basilar atelectasis without acute infiltrate. Electronically Signed   By: Lavonia Dana M.D.   On: 06/11/2015 17:03   Ct Angio Chest Pe W/cm &/or Wo Cm  06/11/2015  CLINICAL DATA:  Dyspnea with sharp chest pain and elevated D-dimer. EXAM: CT ANGIOGRAPHY CHEST WITH CONTRAST TECHNIQUE: Multidetector CT imaging of the chest was performed using the standard protocol during bolus administration of intravenous contrast. Multiplanar CT image reconstructions and MIPs were obtained to evaluate the vascular anatomy. CONTRAST:  59 cc Isovue 370 intravenous COMPARISON:  05/28/2007 FINDINGS: THORACIC INLET/BODY WALL: No acute abnormality. MEDIASTINUM: Cardiomegaly and probable left ventricular hypertrophy. No pericardial effusion. Non-opacified systemic arterial circulation without evidence of acute disease. Great vessels are tortuous and there is atherosclerosis. No noted coronary artery calcification. There is new hilar and mediastinal adenopathy with coarse calcifications.  LUNG WINDOWS: Diffuse airway thickening. Patchy air trapping. There is no edema, consolidation, effusion, or pneumothorax. Eventration of the right diaphragm. UPPER ABDOMEN: No acute findings. OSSEOUS: No acute fracture.  No suspicious lytic or blastic lesions. Review of the MIP images confirms the above findings. IMPRESSION: 1. No evidence of pulmonary embolism. 2. Bronchial wall thickening and patchy air trapping presumably related to patient's history of asthma. 3. Adenopathy with granulomatous type calcification that has developed since 2009 comparison. Electronically Signed   By: Monte Fantasia M.D.   On: 06/11/2015 23:32    EKG: Independently reviewed. Normal sinus rhythm with low-voltage  Assessment/Plan Chest pain: Acute on chronic. Patient reports anywhere from 1 to 2 month history of chest pain symptoms. On evaluation patient found to have negative troponin with EKG showing no significant ST-T wave changes.  - Admit to  a telemetry bed - Trend cardiac enzymes - check EKG at 6 AM - Check echocardiogram - consider need to consult cardiology in a.m. for further evaluation   Elevated d-dimer: D-dimer elevated at 1.65 on admission, but patient appears to have had chronically elevated d-dimer as in the past. CT angiogram showed no acute signs for a pulmonary embolus.  - Continue to monitor   Abdominal pain: Could be secondary to patient's uncontrolled diabetes - Continue to monitor  Diabetes mellitus type 2 with hyperglycemia: Blood glucose elevated at 495 on admission. Previous hemoglobin A1c was noted to be 8 back in 09/2014. - Check urinalysis - Check hemoglobin A1c - Held glipizide and metformin - Lantus 10 units subcutaneous daily - CBGs every 4 hours with sensitive sliding scale insulin for now - Continue gabapentin for neuropathy - Diabetes education in a.m.  Chronic kidney disease stage III: Patient's initial creatinine 1.37 with BUN of 32 suggest a prerenal state. - Follow  repeat CMP in am  Essential hypertension - Continue amlodipine - Held lisinopril    Asthma  - Continue Qvar - DuoNeb prn SOB/wheezing    Anemia:  hemoglobin on admission 11.3 - Continue to monitor   DVT prophylaxis: lovenox Code Status: Full Family Communication:  Disposition Plan: Possible discharge home in a.m. Consults called:    Admission status: Telemetry  Norval Morton MD [facility] Pager [phone]  If 7PM-7AM, please contact night-coverage [email or web address] Password [facility]  06/12/2015, 12:27 AM

## 2015-06-13 ENCOUNTER — Observation Stay (HOSPITAL_COMMUNITY): Payer: Medicare Other

## 2015-06-13 ENCOUNTER — Observation Stay (HOSPITAL_BASED_OUTPATIENT_CLINIC_OR_DEPARTMENT_OTHER): Payer: Medicare Other

## 2015-06-13 DIAGNOSIS — R079 Chest pain, unspecified: Secondary | ICD-10-CM

## 2015-06-13 DIAGNOSIS — E1129 Type 2 diabetes mellitus with other diabetic kidney complication: Secondary | ICD-10-CM

## 2015-06-13 DIAGNOSIS — I10 Essential (primary) hypertension: Secondary | ICD-10-CM | POA: Diagnosis not present

## 2015-06-13 DIAGNOSIS — R0789 Other chest pain: Secondary | ICD-10-CM

## 2015-06-13 DIAGNOSIS — N183 Chronic kidney disease, stage 3 (moderate): Secondary | ICD-10-CM

## 2015-06-13 DIAGNOSIS — N058 Unspecified nephritic syndrome with other morphologic changes: Secondary | ICD-10-CM

## 2015-06-13 DIAGNOSIS — E1165 Type 2 diabetes mellitus with hyperglycemia: Secondary | ICD-10-CM

## 2015-06-13 DIAGNOSIS — J4520 Mild intermittent asthma, uncomplicated: Secondary | ICD-10-CM | POA: Diagnosis not present

## 2015-06-13 DIAGNOSIS — J45909 Unspecified asthma, uncomplicated: Secondary | ICD-10-CM

## 2015-06-13 DIAGNOSIS — E1121 Type 2 diabetes mellitus with diabetic nephropathy: Secondary | ICD-10-CM

## 2015-06-13 DIAGNOSIS — E1122 Type 2 diabetes mellitus with diabetic chronic kidney disease: Secondary | ICD-10-CM | POA: Diagnosis not present

## 2015-06-13 DIAGNOSIS — I129 Hypertensive chronic kidney disease with stage 1 through stage 4 chronic kidney disease, or unspecified chronic kidney disease: Secondary | ICD-10-CM | POA: Diagnosis not present

## 2015-06-13 DIAGNOSIS — Z794 Long term (current) use of insulin: Secondary | ICD-10-CM

## 2015-06-13 LAB — CBC WITH DIFFERENTIAL/PLATELET
BASOS PCT: 1 %
Basophils Absolute: 0.1 10*3/uL (ref 0.0–0.1)
EOS ABS: 1.1 10*3/uL — AB (ref 0.0–0.7)
Eosinophils Relative: 12 %
HCT: 33.1 % — ABNORMAL LOW (ref 36.0–46.0)
HEMOGLOBIN: 11.3 g/dL — AB (ref 12.0–15.0)
Lymphocytes Relative: 37 %
Lymphs Abs: 3.5 10*3/uL (ref 0.7–4.0)
MCH: 29 pg (ref 26.0–34.0)
MCHC: 34.1 g/dL (ref 30.0–36.0)
MCV: 85.1 fL (ref 78.0–100.0)
MONOS PCT: 6 %
Monocytes Absolute: 0.6 10*3/uL (ref 0.1–1.0)
NEUTROS PCT: 44 %
Neutro Abs: 4.1 10*3/uL (ref 1.7–7.7)
PLATELETS: 213 10*3/uL (ref 150–400)
RBC: 3.89 MIL/uL (ref 3.87–5.11)
RDW: 12.4 % (ref 11.5–15.5)
WBC: 9.3 10*3/uL (ref 4.0–10.5)

## 2015-06-13 LAB — COMPREHENSIVE METABOLIC PANEL
ALBUMIN: 3.3 g/dL — AB (ref 3.5–5.0)
ALT: 13 U/L — ABNORMAL LOW (ref 14–54)
AST: 17 U/L (ref 15–41)
Alkaline Phosphatase: 59 U/L (ref 38–126)
Anion gap: 9 (ref 5–15)
BILIRUBIN TOTAL: 0.6 mg/dL (ref 0.3–1.2)
BUN: 20 mg/dL (ref 6–20)
CALCIUM: 8.9 mg/dL (ref 8.9–10.3)
CO2: 28 mmol/L (ref 22–32)
CREATININE: 1.16 mg/dL — AB (ref 0.44–1.00)
Chloride: 104 mmol/L (ref 101–111)
GFR calc non Af Amer: 46 mL/min — ABNORMAL LOW (ref >60)
GFR, EST AFRICAN AMERICAN: 54 mL/min — AB (ref >60)
Glucose, Bld: 126 mg/dL — ABNORMAL HIGH (ref 65–99)
Potassium: 3.7 mmol/L (ref 3.5–5.1)
Sodium: 141 mmol/L (ref 135–145)
Total Protein: 7.5 g/dL (ref 6.5–8.1)

## 2015-06-13 LAB — NM MYOCAR MULTI W/SPECT W/WALL MOTION / EF
CHL CUP MPHR: 149 {beats}/min
CHL CUP NUCLEAR SRS: 7
CHL CUP NUCLEAR SSS: 12
CHL CUP RESTING HR STRESS: 88 {beats}/min
CSEPEDS: 0 s
Estimated workload: 1 METS
Exercise duration (min): 0 min
LV sys vol: 19 mL
LVDIAVOL: 45 mL (ref 46–106)
NUC STRESS TID: 1.62
Peak HR: 102 {beats}/min
Percent HR: 68 %
RATE: 0
SDS: 5

## 2015-06-13 LAB — ECHOCARDIOGRAM COMPLETE
FS: 25 % — AB (ref 28–44)
Height: 66 in
IVS/LV PW RATIO, ED: 1.02
LA ID, A-P, ES: 23 mm
LA diam end sys: 23 mm
LA vol index: 11.3 mL/m2
LADIAMINDEX: 1.35 cm/m2
LAVOL: 19.4 mL
LAVOLA4C: 18.8 mL
LV PW d: 6.64 mm — AB (ref 0.6–1.1)
LV TDI E'LATERAL: 5.98
LV dias vol index: 21 mL/m2
LV dias vol: 35 mL — AB (ref 46–106)
LV e' LATERAL: 5.98 cm/s
LVSYSVOL: 14 mL (ref 14–42)
LVSYSVOLIN: 8 mL/m2
P 1/2 time: 552 ms
Simpson's disk: 61
Stroke v: 21 ml
Weight: 2217.6 oz

## 2015-06-13 LAB — GLUCOSE, CAPILLARY
GLUCOSE-CAPILLARY: 128 mg/dL — AB (ref 65–99)
GLUCOSE-CAPILLARY: 155 mg/dL — AB (ref 65–99)
GLUCOSE-CAPILLARY: 226 mg/dL — AB (ref 65–99)
GLUCOSE-CAPILLARY: 245 mg/dL — AB (ref 65–99)
Glucose-Capillary: 174 mg/dL — ABNORMAL HIGH (ref 65–99)
Glucose-Capillary: 295 mg/dL — ABNORMAL HIGH (ref 65–99)
Glucose-Capillary: 331 mg/dL — ABNORMAL HIGH (ref 65–99)

## 2015-06-13 MED ORDER — TECHNETIUM TC 99M TETROFOSMIN IV KIT
30.0000 | PACK | Freq: Once | INTRAVENOUS | Status: AC | PRN
  Administered 2015-06-13: 30 via INTRAVENOUS

## 2015-06-13 MED ORDER — TECHNETIUM TC 99M TETROFOSMIN IV KIT
10.0000 | PACK | Freq: Once | INTRAVENOUS | Status: AC | PRN
  Administered 2015-06-13: 10 via INTRAVENOUS

## 2015-06-13 MED ORDER — IOPAMIDOL (ISOVUE-370) INJECTION 76%
80.0000 mL | Freq: Once | INTRAVENOUS | Status: AC | PRN
  Administered 2015-06-11: 80 mL via INTRAVENOUS

## 2015-06-13 MED ORDER — REGADENOSON 0.4 MG/5ML IV SOLN
INTRAVENOUS | Status: AC
  Administered 2015-06-13: 0.4 mg via INTRAVENOUS
  Filled 2015-06-13: qty 5

## 2015-06-13 MED ORDER — REGADENOSON 0.4 MG/5ML IV SOLN
0.4000 mg | Freq: Once | INTRAVENOUS | Status: AC
  Administered 2015-06-13: 0.4 mg via INTRAVENOUS
  Filled 2015-06-13: qty 5

## 2015-06-13 MED ORDER — TRAMADOL HCL 50 MG PO TABS: 50.0000 mg | ORAL_TABLET | Freq: Four times a day (QID) | ORAL | Status: DC | PRN

## 2015-06-13 MED ORDER — ENOXAPARIN SODIUM 40 MG/0.4ML ~~LOC~~ SOLN
40.0000 mg | SUBCUTANEOUS | Status: DC
  Administered 2015-06-13: 40 mg via SUBCUTANEOUS
  Filled 2015-06-13: qty 0.4

## 2015-06-13 NOTE — Progress Notes (Addendum)
Inpatient Diabetes Program Recommendations  AACE/ADA: New Consensus Statement on Inpatient Glycemic Control (2015)  Target Ranges:  Prepandial:   less than 140 mg/dL      Peak postprandial:   less than 180 mg/dL (1-2 hours)      Critically ill patients:  140 - 180 mg/dL   Results for Nichole Munoz, Nichole Munoz (MRN [id number]) as of 06/13/2015 09:06  Ref. Range 06/12/2015 08:21 06/12/2015 11:45 06/12/2015 16:51 06/12/2015 20:46 06/13/2015 00:29 06/13/2015 04:26 06/13/2015 08:54  Glucose-Capillary Latest Ref Range: 65-99 mg/dL 222 (H) 362 (H) 266 (H) 297 (H) 245 (H) 128 (H) 174 (H)   Review of Glycemic Control  Diabetes history: DM2 Outpatient Diabetes medications: Glipizide 10 mg BID, Metformin 1000 mg BID (home medication list has Lantus 20 units QAM but pt states she is not taking any insulin and has never taken insulin) Current orders for Inpatient glycemic control: Lantus 15 unitsQHS, Novolog 0-9 units Q4H  Inpatient Diabetes Program Recommendations: Insulin - Basal: Patient received Lantus 15 units at 15:56 on 06/12/15 and has received a total of Novolog 14 units for correction since receiving Lantus. Please consider increasing Lantus to 20 units QHS.  NOTE: Used translation line to communicate with patient as patient speaks Camboidan-Khmer. Patient is not able to read [place] or [place] but she states her son can read [place] and her daughter can read [place]. Provided [place] and [place] written material on diabetes and insulin injections. Spoke with patient (through translator) about diabetes and insulin. No other family is present in room with patient at this time and patient reports that all her family is at work. Patient reports that her son has diabetes and he takes insulin with an insulin pen. Patient has never taken or given insulin to herself or anyone else but she has watched her son give himself insulin injections.  Discussed  glucose and A1C goals. Informed patient that she will be discharged on insulin and she is  willing to take insulin injections. Discussed Lantus insulin and informed patient that she will likely be taking Lantus once a day at home. Discussed importance of checking CBGs and maintaining good CBG control to prevent long-term and short-term complications. Patient reports that she has a glucometer and her son also has one he uses.  Educated patient on insulin pen use at home. Reviewed contents of insulin flexpen starter kit. Reviewed all steps of insulin pen including attachment of needle, 2-unit air shot, dialing up dose, giving injection, removing needle, disposal of sharps, storage of unused insulin, disposal of insulin etc. With verbal cues ,patient is able to provide successful return demonstration.  Patient reports that she can not always remember things well but she is sure her son will be assisting her with insulin injections since he uses an insulin pen himself.  Informed patient that RNs will be asking her to administer herself insulin injections to ensure she is able to self-administer insulin shots.  Patient verbalized understanding of information discussed and he states that he has no further questions at this time related to diabetes.  Would recommend that patient be placed on simplified insulin regimen such as once a day Lantus if she will discharge on insulin.   MD to give patient Rxs for insulin pens and insulin pen needles (if she is discharged on insulin).  Addendum 06/13/15'@15'$ :43-Called patient's son(Thorn Baumgart) over the phone and talked with him regarding insulin. Patient's son states that he uses insulin himself but uses vial and syringe. Patient states that he also is familiar and  understands how to use an insulin pen. Inquired if the vial and syringe would be preferred and he stated no that the insulin pen will be fine. Informed patient's son that the bedside nursing staff will review the insulin pen with him while he is visiting his mother. Patient's son verbalized understanding of  information and states he has no further questions at this time. Called Nellie, RN and asked that bedside nursing review insulin pen with patient's son using the insulin pen teaching kit that is located in the medication room for each unit.    Thanks, Barnie Alderman, RN, MSN, CDE Diabetes Coordinator Inpatient Diabetes Program [phone] (Team Pager) [phone] ([facility] office) [phone] [facility] office) [phone] [facility] office)

## 2015-06-13 NOTE — Progress Notes (Signed)
   Lamont Holstad presented for a [place] cardiolite today.  No immediate complications.  Stress imaging pending.  Reino Bellis, NP 06/13/2015, 2:37 PM

## 2015-06-13 NOTE — Progress Notes (Addendum)
Patient ID: Nichole Munoz, female   DOB: 15-Feb-1945, 70 y.o.   MRN: [id number]    PROGRESS NOTE    Nichole Munoz  [id number] DOB: [birth year] DOA: 06/11/2015  PCP: [place]   Brief Narrative:  69 yo [place] female with history of HTN, DM uncontrolled due to medical non compliance, admitted with about one month duration of intermitted episodes of chest pain.   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 Previous hemoglobin A1c was noted to be 8 back in 09/2014. - hemoglobin A1c pending - Lantus 15 units subcutaneous daily with SSI  - Continue gabapentin for neuropathy  Chronic kidney disease stage III - Patient's initial creatinine 1.37, GFR in 40's  - Cr trending down - BMP in AM  Essential hypertension - Continue amlodipine - Held lisinopril   Asthma, mild intermittent, chronic  - Continue Qvar - DuoNeb prn SOB/wheezing  Anemia of chronic disease - no signs of bleeding - CBC in AM  DVT prophylaxis: Lovenox SQ Code Status: Full  Family Communication: Patient at bedside  Disposition Plan: Home pending ECHO and Myoview   Consultants:   None  Procedures:   ECHO  Myoview   Antimicrobials:   None  Subjective: Still with mid sternal chest pain that is ~ 7/10 in severity when present.   Objective: Filed Vitals:   06/13/15 1410 06/13/15 1426  06/13/15 1428 06/13/15 1430  BP: 152/83 170/87 117/56 121/61  Pulse:      Temp:      TempSrc:      Resp:      Height:      Weight:      SpO2:        Intake/Output Summary (Last 24 hours) at 06/13/15 1634 Last data filed at 06/13/15 0700  Gross per 24 hour  Intake    320 ml  Output    400 ml  Net    -80 ml   Filed Weights   06/12/15 0159 06/12/15 0315 06/13/15 0601  Weight: 63.52 kg (140 lb 0.6 oz) 63.322 kg (139 lb 9.6 oz) 62.869 kg (138 lb 9.6 oz)    Examination:  General exam: Appears calm and comfortable  Respiratory system: Clear to auscultation. Respiratory effort normal. Cardiovascular system: S1 & S2 heard, RRR. No JVD, rubs, gallops or clicks. No pedal edema. Gastrointestinal system: Abdomen is nondistended, soft and nontender.  Central nervous system: Alert and oriented. No focal neurological deficits. Extremities: Symmetric 5 x 5 power. Skin: No rashes, lesions or ulcers Psychiatry: Judgement and insight appear normal. Mood & affect appropriate.   Data Reviewed: I have personally reviewed following labs and imaging studies  CBC:  Recent Labs Lab 06/11/15 1629 06/13/15 0529  WBC 8.2 9.3  NEUTROABS  --  4.1  HGB 11.3* 11.3*  HCT 33.3* 33.1*  MCV 85.2 85.1  PLT 218 213  Basic Metabolic Panel:  Recent Labs Lab 06/11/15 1629 06/13/15 0529  NA 136 141  K 3.9 3.7  CL 100* 104  CO2 27 28  GLUCOSE 495* 126*  BUN 32* 20  CREATININE 1.37* 1.16*  CALCIUM 9.1 8.9   Liver Function Tests:  Recent Labs Lab 06/13/15 0529  AST 17  ALT 13*  ALKPHOS 59  BILITOT 0.6  PROT 7.5  ALBUMIN 3.3*   Cardiac Enzymes:  Recent Labs Lab 06/12/15 0131 06/12/15 0414 06/12/15 0818  TROPONINI <0.03 <0.03 <0.03   CBG:  Recent Labs Lab 06/12/15 2046 06/13/15 0029 06/13/15 0426 06/13/15 0854 06/13/15 1212  GLUCAP 297* 245* 128* 174* 155*   Lipid Profile:  Recent Labs  06/12/15 0414  CHOL 192  HDL 37*  LDLCALC 123*  TRIG 160*  CHOLHDL 5.2    Urine analysis:    Component Value Date/Time   COLORURINE YELLOW 06/12/2015 [place] 06/12/2015 1645   LABSPEC 1.022 06/12/2015 1645   PHURINE 6.5 06/12/2015 1645   GLUCOSEU >1000* 06/12/2015 1645   HGBUR NEGATIVE 06/12/2015 1645   BILIRUBINUR NEGATIVE 06/12/2015 1645   KETONESUR NEGATIVE 06/12/2015 1645   PROTEINUR NEGATIVE 06/12/2015 1645   UROBILINOGEN 1.0 09/23/2014 1458   NITRITE NEGATIVE 06/12/2015 1645   LEUKOCYTESUR NEGATIVE 06/12/2015 1645   Radiology Studies: Dg Chest 2 View 06/11/2015  Minimal RIGHT basilar atelectasis without acute infiltrate.  Ct Angio Chest Pe W/cm &/or Wo Cm 06/11/2015  1. No evidence of pulmonary embolism. 2. Bronchial wall thickening and patchy air trapping presumably related to patient's history of asthma. 3. Adenopathy with granulomatous type calcification that has developed since 2009 comparison.   Scheduled Meds: . amLODipine  5 mg Oral Daily  . budesonide  0.5 mg Inhalation BID  . cholecalciferol  2,000 Units Oral Daily  . enoxaparin (LOVENOX) injection  1 mg/kg Subcutaneous Q12H  . gabapentin  300 mg Oral TID  . insulin aspart  0-9 Units Subcutaneous Q4H  . insulin glargine  15 Units Subcutaneous QHS  . loratadine  10 mg Oral Daily   Continuous Infusions: . sodium chloride Stopped (06/12/15 1150)   Time spent: 20 minutes   Faye Ramsay, MD [facility] Pager [phone]  If 7PM-7AM, please contact night-coverage [email or web address] Password [facility] 06/13/2015, 4:34 PM

## 2015-06-13 NOTE — Progress Notes (Signed)
The patient and her son are in Nuclear Med department. The son is interpreting (waiver of right to free interpreter services is signed). Will proceed with nuclear stress test.

## 2015-06-13 NOTE — Progress Notes (Signed)
Patient was here for her stress test. Interpreter present. Patient does not know her date of birth, thinks she is around 62. Does not know who the president is and does not know what day it is. Had some difficulty with her name at first as well. Called her son at home and mobile, no answer so left voicemails on both phones to call me as patient is not able to sign the consent form. In the meantime have sent her back to her room to wait for call from her son. Also once patient back in room the nurse Nellie can look in her purse as she told the interpreter there is a phone # for her daughter in there. Angelica Ran NP aware of delay and reason.

## 2015-06-13 NOTE — Progress Notes (Signed)
Attempted to do teaching of insulin pen with pt's family member Pervis Hocking stated " I understand how to do the pen insulin as I take it myself. "  Karie Kirks, RN

## 2015-06-13 NOTE — Progress Notes (Signed)
Notified by Vaughan Basta in nuclear med to get pt's daughter's number when pt  get back on the floor as they not able to do stress test at this time pt said that daughter's number is in her bag.  Went to pt's room to obtain phone number pt.  Said that no number is in her bag motioning and saying " my son come here give you".  Notified Linda in nuclear med.  Will continue to monitor.  Karie Kirks, Therapist, sports.

## 2015-06-13 NOTE — Progress Notes (Signed)
Pt a/o, no c/o pain, vss, pt stable 

## 2015-06-13 NOTE — Progress Notes (Signed)
Echocardiogram 2D Echocardiogram has been performed.  06/13/2015 4:17 PM Maudry Mayhew, RVT, RDCS, RDMS

## 2015-06-14 DIAGNOSIS — R079 Chest pain, unspecified: Secondary | ICD-10-CM | POA: Insufficient documentation

## 2015-06-14 DIAGNOSIS — N183 Chronic kidney disease, stage 3 (moderate): Secondary | ICD-10-CM | POA: Diagnosis not present

## 2015-06-14 DIAGNOSIS — R0789 Other chest pain: Secondary | ICD-10-CM | POA: Diagnosis not present

## 2015-06-14 DIAGNOSIS — I10 Essential (primary) hypertension: Secondary | ICD-10-CM | POA: Diagnosis not present

## 2015-06-14 DIAGNOSIS — E1121 Type 2 diabetes mellitus with diabetic nephropathy: Secondary | ICD-10-CM | POA: Diagnosis not present

## 2015-06-14 LAB — CBC
HEMATOCRIT: 35.2 % — AB (ref 36.0–46.0)
Hemoglobin: 11.8 g/dL — ABNORMAL LOW (ref 12.0–15.0)
MCH: 28.8 pg (ref 26.0–34.0)
MCHC: 33.5 g/dL (ref 30.0–36.0)
MCV: 85.9 fL (ref 78.0–100.0)
Platelets: 235 10*3/uL (ref 150–400)
RBC: 4.1 MIL/uL (ref 3.87–5.11)
RDW: 12.4 % (ref 11.5–15.5)
WBC: 9.1 10*3/uL (ref 4.0–10.5)

## 2015-06-14 LAB — HEMOGLOBIN A1C
HEMOGLOBIN A1C: 10 % — AB (ref 4.8–5.6)
MEAN PLASMA GLUCOSE: 240 mg/dL

## 2015-06-14 LAB — GLUCOSE, CAPILLARY
Glucose-Capillary: 132 mg/dL — ABNORMAL HIGH (ref 65–99)
Glucose-Capillary: 184 mg/dL — ABNORMAL HIGH (ref 65–99)
Glucose-Capillary: 296 mg/dL — ABNORMAL HIGH (ref 65–99)

## 2015-06-14 LAB — BASIC METABOLIC PANEL
Anion gap: 8 (ref 5–15)
BUN: 22 mg/dL — AB (ref 6–20)
CALCIUM: 9 mg/dL (ref 8.9–10.3)
CO2: 27 mmol/L (ref 22–32)
CREATININE: 1.19 mg/dL — AB (ref 0.44–1.00)
Chloride: 102 mmol/L (ref 101–111)
GFR calc Af Amer: 52 mL/min — ABNORMAL LOW (ref >60)
GFR calc non Af Amer: 45 mL/min — ABNORMAL LOW (ref >60)
GLUCOSE: 151 mg/dL — AB (ref 65–99)
Potassium: 3.6 mmol/L (ref 3.5–5.1)
Sodium: 137 mmol/L (ref 135–145)

## 2015-06-14 MED ORDER — ALBUTEROL SULFATE HFA 108 (90 BASE) MCG/ACT IN AERS: 2.0000 | INHALATION_SPRAY | Freq: Four times a day (QID) | RESPIRATORY_TRACT | Status: DC | PRN

## 2015-06-14 MED ORDER — BECLOMETHASONE DIPROPIONATE 80 MCG/ACT IN AERS: 2.0000 | INHALATION_SPRAY | RESPIRATORY_TRACT | Status: DC | PRN

## 2015-06-14 MED ORDER — LISINOPRIL 5 MG PO TABS: 5.0000 mg | ORAL_TABLET | Freq: Every day | ORAL | Status: DC

## 2015-06-14 NOTE — Discharge Instructions (Signed)

## 2015-06-14 NOTE — Discharge Summary (Signed)
Physician Discharge Summary  Nichole Munoz [id number] DOB: 1945-05-15 DOA: 06/11/2015  PCP: [place] date: 06/11/2015 Discharge date: 06/14/2015  Recommendations for Outpatient Follow-up:  1. Pt will need to follow up with PCP in 1-2 weeks post discharge 2. Please obtain BMP to evaluate electrolytes and kidney function 3. Please also check CBC to evaluate Hg and Hct levels  4. Please address medical compliance especially with insulin 5. Please note that dose of lisinopril was lowered as BP was on low end of normal   Discharge Diagnoses:  Principal Problem:   Chest pain Active Problems:   CKD (chronic kidney disease) stage 3, GFR 30-59 ml/min   Benign essential HTN   DM (diabetes mellitus), type 2, uncontrolled, with renal complications ([place])   Asthma   Discharge Condition: Stable  Diet recommendation: Heart healthy diet discussed in details    Brief Narrative:  [name] with history of HTN, DM uncontrolled due to medical non compliance, admitted with about one month duration of intermitted episodes of chest pain.   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 Previous hemoglobin A1c was noted to be 8 back in 09/2014. - hemoglobin A1c 10 - Continue gabapentin for neuropathy - continue lantus per home regimen    Chronic kidney disease stage III - Patient's initial creatinine 1.37, GFR in 40's  - Cr trending down  Essential hypertension - Continue home regimen  - please note that dose of lisinopril was lowered as BP was on low end of normal   Asthma, mild intermittent, chronic  - DuoNeb prn SOB/wheezing  Anemia of chronic disease - no signs of bleeding  Code Status: Full  Family Communication: Patient and son at bedside  Disposition Plan: Home  Consultants:   None  Procedures:   ECHO  Myoview  Antimicrobials:   None  Discharge Exam: Filed Vitals:   06/13/15 2123 06/14/15 0404  BP: 115/59 107/62  Pulse: 87 77  Temp: 98.5 F (36.9 C) 97.4 F (36.3 C)  Resp: 15 16   Filed Vitals:   06/13/15 1809 06/13/15 2048 06/13/15 2123 06/14/15 0404  BP: 127/90  115/59 107/62  Pulse:   87 77  Temp:   98.5 F (36.9 C) 97.4 F (36.3 C)  TempSrc:   Oral Oral  Resp:   15 16  Height:      Weight:    62.2 kg (137 lb 2 oz)  SpO2:  97% 98% 98%    General: Pt is alert, follows commands appropriately, not in acute distress Cardiovascular: Regular rate and rhythm, S1/S2 +, no murmurs, no rubs, no gallops Respiratory: Clear to auscultation bilaterally, no wheezing, no crackles, no rhonchi Abdominal: Soft, non tender, non distended, bowel sounds +, no guarding   Discharge Instructions  Discharge Instructions    Diet - low sodium heart healthy    Complete by:  As directed      Increase activity slowly    Complete by:  As directed             Medication List    TAKE these medications        acetaminophen 325 MG tablet  Commonly known as:  TYLENOL  Take 2 tablets (650 mg total) by mouth every 6 (six) hours as needed for mild pain (or Fever >/= 101).     albuterol 108 (90 Base) MCG/ACT inhaler  Commonly known as:  PROVENTIL HFA;VENTOLIN HFA  Inhale 2 puffs into the lungs every 6 (six) hours as needed for wheezing or shortness of breath.     amLODipine 5 MG tablet   Commonly known as:  NORVASC  Take 1 tablet (5 mg total) by mouth daily.     beclomethasone 80 MCG/ACT inhaler  Commonly known as:  QVAR  Inhale 2-4 puffs into the lungs every 4 (four) hours as needed (SOB).     cetirizine 10 MG tablet  Commonly known as:  ZYRTEC  Take 0.5 tablets (5 mg total) by mouth daily.     gabapentin 300 MG capsule  Commonly known as:  NEURONTIN  Take 1 capsule (300 mg total) by mouth 3 (three) times daily.     glipiZIDE 10 MG 24 hr tablet  Commonly known as:  GLUCOTROL XL  Take 1 tablet (10 mg total) by mouth every morning.     hydrOXYzine 10 MG tablet  Commonly known as:  ATARAX/VISTARIL  Take 10 mg by mouth 2 (two) times daily as needed. For itching.     hyoscyamine 0.125 MG SL tablet  Commonly known as:  LEVSIN/SL  Place 1 tablet (0.125 mg total) under the tongue every 4 (four) hours as needed.     ibuprofen 200 MG tablet  Commonly known as:  ADVIL,MOTRIN  Take 200 mg by mouth every 6 (six) hours as needed for headache, mild pain or moderate pain.     Insulin Glargine 100 UNIT/ML Solostar Pen  Commonly known as:  LANTUS SOLOSTAR  Inject 20 Units into the skin every morning.     lisinopril 5 MG tablet  Commonly known as:  PRINIVIL,ZESTRIL  Take 1 tablet (5 mg total) by mouth at bedtime.     metFORMIN 1000 MG tablet  Commonly known as:  GLUCOPHAGE  Take 1,000 mg by mouth 2 (two) times daily.     ondansetron 8 MG disintegrating tablet  Commonly known as:  ZOFRAN ODT  8mg  ODT q8 hours prn nausea            Follow-up Information    Follow up with [facility] information:   [place] [id number] [phone]        The results of significant diagnostics from this hospitalization (including imaging, microbiology, ancillary and laboratory) are listed below for reference.     Microbiology: No results found for this or any previous visit (from the past 240 hour(s)).    Labs: Basic Metabolic Panel:  Recent Labs Lab 06/11/15 1629 06/13/15 0529 06/14/15 0430  NA 136 141 137  K 3.9 3.7 3.6  CL 100* 104 102  CO2 27 28 27   GLUCOSE 495* 126* 151*  BUN 32* 20 22*  CREATININE 1.37* 1.16* 1.19*  CALCIUM 9.1 8.9 9.0   Liver Function Tests:  Recent Labs Lab 06/13/15 0529  AST 17  ALT 13*  ALKPHOS 59  BILITOT 0.6  PROT 7.5  ALBUMIN 3.3*   CBC:  Recent Labs Lab 06/11/15 1629 06/13/15 0529 06/14/15 0430  WBC 8.2 9.3 9.1  NEUTROABS  --  4.1  --   HGB 11.3* 11.3* 11.8*  HCT 33.3* 33.1* 35.2*  MCV 85.2 85.1 85.9  PLT 218 213 235   Cardiac Enzymes:  Recent Labs Lab 06/12/15 0131 06/12/15 0414 06/12/15 0818  TROPONINI <0.03 <0.03 <0.03   BNP: BNP (last 3 results)  Recent Labs  09/23/14 1450  BNP 18.1   CBG:  Recent Labs Lab 06/13/15 2122 06/13/15 2149 06/14/15 0019 06/14/15 0508 06/14/15 0824  GLUCAP 295* 331* 296* 132* 184*   SIGNED: Time coordinating discharge:  30 minutes  MAGICK-Abdulla Pooley, MD  [facility] 06/14/2015, 10:29 AM Pager [phone]  If 7PM-7AM, please contact night-coverage [email or web address] Password [email or web address]

## 2015-06-14 NOTE — Progress Notes (Signed)
Discharge instruction given to pt  Interpreted by son.Verbalized understanding Wheeled to lobby by NT

## 2015-06-14 NOTE — Care Management Obs Status (Signed)
[place] NOTIFICATION   Patient Details  Name: Karielle Rasmusson MRN: [id number] Date of Birth: 1945/08/26   Medicare Observation Status Notification Given:  Yes    Marthann, Boy, RN 06/14/2015, 10:58 AM

## 2015-06-17 ENCOUNTER — Encounter (HOSPITAL_COMMUNITY): Payer: Self-pay | Admitting: Emergency Medicine

## 2015-06-17 ENCOUNTER — Emergency Department (HOSPITAL_COMMUNITY): Payer: Medicare Other

## 2015-06-17 DIAGNOSIS — I10 Essential (primary) hypertension: Secondary | ICD-10-CM | POA: Insufficient documentation

## 2015-06-17 DIAGNOSIS — J45909 Unspecified asthma, uncomplicated: Secondary | ICD-10-CM | POA: Insufficient documentation

## 2015-06-17 DIAGNOSIS — E669 Obesity, unspecified: Secondary | ICD-10-CM | POA: Diagnosis not present

## 2015-06-17 DIAGNOSIS — Y999 Unspecified external cause status: Secondary | ICD-10-CM | POA: Insufficient documentation

## 2015-06-17 DIAGNOSIS — W1839XA Other fall on same level, initial encounter: Secondary | ICD-10-CM | POA: Insufficient documentation

## 2015-06-17 DIAGNOSIS — Z794 Long term (current) use of insulin: Secondary | ICD-10-CM | POA: Diagnosis not present

## 2015-06-17 DIAGNOSIS — Z7984 Long term (current) use of oral hypoglycemic drugs: Secondary | ICD-10-CM | POA: Diagnosis not present

## 2015-06-17 DIAGNOSIS — Z79899 Other long term (current) drug therapy: Secondary | ICD-10-CM | POA: Diagnosis not present

## 2015-06-17 DIAGNOSIS — Y939 Activity, unspecified: Secondary | ICD-10-CM | POA: Insufficient documentation

## 2015-06-17 DIAGNOSIS — M79672 Pain in left foot: Secondary | ICD-10-CM | POA: Diagnosis not present

## 2015-06-17 DIAGNOSIS — Z791 Long term (current) use of non-steroidal anti-inflammatories (NSAID): Secondary | ICD-10-CM | POA: Insufficient documentation

## 2015-06-17 DIAGNOSIS — E1140 Type 2 diabetes mellitus with diabetic neuropathy, unspecified: Secondary | ICD-10-CM | POA: Diagnosis not present

## 2015-06-17 DIAGNOSIS — Y929 Unspecified place or not applicable: Secondary | ICD-10-CM | POA: Insufficient documentation

## 2015-06-17 DIAGNOSIS — Z87891 Personal history of nicotine dependence: Secondary | ICD-10-CM | POA: Insufficient documentation

## 2015-06-17 NOTE — ED Notes (Signed)
Pt was recently hospitalized for hyperglycemia. While hospitalized, pt states she fell and hit her foot on a desk. Pt states that the pain is 10/10. Pt currently has tigerbalm on the foot. There is no obvious swelling to her left foot.

## 2015-06-18 ENCOUNTER — Emergency Department (HOSPITAL_COMMUNITY)
Admission: EM | Admit: 2015-06-18 | Discharge: 2015-06-18 | Disposition: A | Discharge status: Home / Self Care | Payer: Medicare Other | Attending: Emergency Medicine | Admitting: Emergency Medicine

## 2015-06-18 DIAGNOSIS — M79672 Pain in left foot: Secondary | ICD-10-CM

## 2015-06-18 MED ORDER — HYDROCODONE-ACETAMINOPHEN 5-325 MG PO TABS: 1.0000 | ORAL_TABLET | Freq: Four times a day (QID) | ORAL | Status: DC | PRN

## 2015-06-18 NOTE — Discharge Instructions (Signed)

## 2015-06-18 NOTE — ED Provider Notes (Signed)
CSN: [id number]     Arrival date & time 06/17/15  2221 History   First MD Initiated Contact with Patient 06/18/15 0028     Chief Complaint  Patient presents with  . Fall  . Foot Injury     (Consider location/radiation/quality/duration/timing/severity/associated sxs/prior Treatment) HPI Comments: Patient presents to the emergency department with chief complaint of left foot pain.  Patient states that she bumped her foot well in the hospital 3 days ago. She states that she has had some moderate pain over the lateral aspect of her left foot. She has tried using topical creams with no relief. It is worsened with palpation and movement. There are no other associated symptoms or modifying factors.  The history is provided by the patient. No language interpreter was used.    Past Medical History  Diagnosis Date  . Diabetes mellitus   . Hypertension   . Asthma   . Diabetic neuropathy ([facility])   . Seasonal allergies   . Obesity    History reviewed. No pertinent past surgical history. No family history on file. Social History  Substance Use Topics  . Smoking status: Former Research scientist (life sciences)  . Smokeless tobacco: Never Used  . Alcohol Use: No   OB History    No data available     Review of Systems  Constitutional: Negative for fever and chills.  Respiratory: Negative for shortness of breath.   Cardiovascular: Negative for chest pain.  Gastrointestinal: Negative for nausea, vomiting, diarrhea and constipation.  Genitourinary: Negative for dysuria.  Musculoskeletal: Positive for arthralgias.  All other systems reviewed and are negative.     Allergies  Other  Home Medications   Prior to Admission medications   Medication Sig Start Date End Date Taking? Authorizing Provider  acetaminophen (TYLENOL) 325 MG tablet Take 2 tablets (650 mg total) by mouth every 6 (six) hours as needed for mild pain (or Fever >/= 101). 09/25/14   Robbie Lis, MD  albuterol (PROVENTIL HFA;VENTOLIN HFA) 108 (90  Base) MCG/ACT inhaler Inhale 2 puffs into the lungs every 6 (six) hours as needed for wheezing or shortness of breath. 06/14/15   Theodis Blaze, MD  amLODipine (NORVASC) 5 MG tablet Take 1 tablet (5 mg total) by mouth daily. 09/28/14   Robbie Lis, MD  beclomethasone (QVAR) 80 MCG/ACT inhaler Inhale 2-4 puffs into the lungs every 4 (four) hours as needed (SOB). 06/14/15   Theodis Blaze, MD  cetirizine (ZYRTEC) 10 MG tablet Take 0.5 tablets (5 mg total) by mouth daily. 10/27/11   Charlann Lange, PA-C  gabapentin (NEURONTIN) 300 MG capsule Take 1 capsule (300 mg total) by mouth 3 (three) times daily. 01/22/12   Virgel Manifold, MD  glipiZIDE (GLUCOTROL XL) 10 MG 24 hr tablet Take 1 tablet (10 mg total) by mouth every morning. Patient taking differently: Take 10 mg by mouth 2 (two) times daily.  04/15/13   Domenic Polite, MD  HYDROcodone-acetaminophen (NORCO/VICODIN) 5-325 MG tablet Take 1-2 tablets by mouth every 6 (six) hours as needed. 06/18/15   Montine Circle, PA-C  hydrOXYzine (ATARAX/VISTARIL) 10 MG tablet Take 10 mg by mouth 2 (two) times daily as needed. For itching. 11/24/14   Historical Provider, MD  hyoscyamine (LEVSIN/SL) 0.125 MG SL tablet Place 1 tablet (0.125 mg total) under the tongue every 4 (four) hours as needed. 02/05/15   April Palumbo, MD  ibuprofen (ADVIL,MOTRIN) 200 MG tablet Take 200 mg by mouth every 6 (six) hours as needed for headache, mild pain or moderate  pain.    Historical Provider, MD  Insulin Glargine (LANTUS SOLOSTAR) 100 UNIT/ML Solostar Pen Inject 20 Units into the skin every morning. 09/25/14   Robbie Lis, MD  lisinopril (PRINIVIL,ZESTRIL) 5 MG tablet Take 1 tablet (5 mg total) by mouth at bedtime. 06/14/15   Theodis Blaze, MD  metFORMIN (GLUCOPHAGE) 1000 MG tablet Take 1,000 mg by mouth 2 (two) times daily. 11/24/14   Historical Provider, MD  ondansetron (ZOFRAN ODT) 8 MG disintegrating tablet 8mg  ODT q8 hours prn nausea Patient taking differently: Take 8 mg by mouth every  8 (eight) hours as needed for nausea.  02/05/15   April Palumbo, MD   BP 141/96 mmHg  Pulse 88  Temp(Src) 98.2 F (36.8 C) (Oral)  Resp 16  SpO2 100% Physical Exam  Constitutional: She is oriented to person, place, and time. She appears well-developed and well-nourished.  HENT:  Head: Normocephalic and atraumatic.  Eyes: Conjunctivae and EOM are normal. Pupils are equal, round, and reactive to light.  Neck: Normal range of motion. Neck supple.  Cardiovascular: Normal rate, regular rhythm and intact distal pulses.  Exam reveals no gallop and no friction rub.   No murmur heard. Pulmonary/Chest: Effort normal and breath sounds normal. No respiratory distress. She has no wheezes. She has no rales. She exhibits no tenderness.  Abdominal: Soft. Bowel sounds are normal. She exhibits no distension and no mass. There is no tenderness. There is no rebound and no guarding.  Musculoskeletal: Normal range of motion. She exhibits no edema or tenderness.  Left foot mildly tender to palpation, no bony abnormality or deformity Range of motion/strength 5/5  Neurological: She is alert and oriented to person, place, and time.  Sensation intact  Skin: Skin is warm and dry.  No evidence of cellulitis, no erythema  Psychiatric: She has a normal mood and affect. Her behavior is normal. Judgment and thought content normal.  Nursing note and vitals reviewed.   ED Course  Procedures (including critical care time) Labs Review Labs Reviewed - No data to display  Imaging Review Dg Foot Complete Left  06/17/2015  CLINICAL DATA:  Patient fell 4 days ago. Left dorsal foot patent. Recent hospitalization for hyperglycemia. EXAM: LEFT FOOT - COMPLETE 3+ VIEW COMPARISON:  None. FINDINGS: No evidence of acute fracture or dislocation in the left foot. Calcification over the distal Achilles tendon may represent calcific tendinitis. No focal bone lesions or bone destruction. Bone cortex appears intact. IMPRESSION: No  acute bony abnormalities. Calcifications over the distal Achilles tendon may indicate calcific tendinitis. Electronically Signed   By: Lucienne Capers M.D.   On: 06/17/2015 23:41   I have personally reviewed and evaluated these images and lab results as part of my medical decision-making.    MDM   Final diagnoses:  Left foot pain    Patient with left foot pain. No evidence of infection. Plain films are negative. Will give postop shoe for comfort, recommend primary care/orthopedic follow-up.    Montine Circle, PA-C 06/18/15 0101  Leonard Schwartz, MD 06/20/15 [phone]

## 2015-08-10 ENCOUNTER — Ambulatory Visit (HOSPITAL_COMMUNITY)
Admission: EM | Admit: 2015-08-10 | Discharge: 2015-08-10 | Disposition: A | Discharge status: Home / Self Care | Payer: Medicare Other | Attending: Emergency Medicine | Admitting: Emergency Medicine

## 2015-08-10 ENCOUNTER — Encounter (HOSPITAL_COMMUNITY): Payer: Self-pay | Admitting: Emergency Medicine

## 2015-08-10 DIAGNOSIS — I10 Essential (primary) hypertension: Secondary | ICD-10-CM | POA: Diagnosis not present

## 2015-08-10 DIAGNOSIS — E119 Type 2 diabetes mellitus without complications: Secondary | ICD-10-CM

## 2015-08-10 DIAGNOSIS — Z794 Long term (current) use of insulin: Secondary | ICD-10-CM | POA: Diagnosis not present

## 2015-08-10 LAB — POCT I-STAT, CHEM 8
BUN: 22 mg/dL — AB (ref 6–20)
BUN: 22 mg/dL — ABNORMAL HIGH (ref 6–20)
CALCIUM ION: 1.22 mmol/L (ref 1.12–1.23)
CHLORIDE: 100 mmol/L — AB (ref 101–111)
Calcium, Ion: 1.24 mmol/L — ABNORMAL HIGH (ref 1.12–1.23)
Chloride: 100 mmol/L — ABNORMAL LOW (ref 101–111)
Creatinine, Ser: 1.1 mg/dL — ABNORMAL HIGH (ref 0.44–1.00)
Creatinine, Ser: 1.1 mg/dL — ABNORMAL HIGH (ref 0.44–1.00)
Glucose, Bld: 232 mg/dL — ABNORMAL HIGH (ref 65–99)
Glucose, Bld: 233 mg/dL — ABNORMAL HIGH (ref 65–99)
HCT: 38 % (ref 36.0–46.0)
HEMATOCRIT: 37 % (ref 36.0–46.0)
Hemoglobin: 12.6 g/dL (ref 12.0–15.0)
Hemoglobin: 12.9 g/dL (ref 12.0–15.0)
POTASSIUM: 4.4 mmol/L (ref 3.5–5.1)
Potassium: 4.4 mmol/L (ref 3.5–5.1)
SODIUM: 141 mmol/L (ref 135–145)
SODIUM: 142 mmol/L (ref 135–145)
TCO2: 30 mmol/L (ref 0–100)
TCO2: 31 mmol/L (ref 0–100)

## 2015-08-10 MED ORDER — AMLODIPINE BESYLATE 5 MG PO TABS: 5.0000 mg | ORAL_TABLET | Freq: Every day | ORAL | 1 refills | Status: DC

## 2015-08-10 MED ORDER — INSULIN GLARGINE 100 UNIT/ML SOLOSTAR PEN: 20.0000 [IU] | PEN_INJECTOR | Freq: Every morning | SUBCUTANEOUS | 1 refills | Status: DC

## 2015-08-10 MED ORDER — METFORMIN HCL 1000 MG PO TABS: 1000.0000 mg | ORAL_TABLET | Freq: Two times a day (BID) | ORAL | 1 refills | Status: DC

## 2015-08-10 MED ORDER — GLIPIZIDE ER 10 MG PO TB24: 10.0000 mg | ORAL_TABLET | Freq: Every morning | ORAL | 0 refills | Status: DC

## 2015-08-10 MED ORDER — LISINOPRIL 5 MG PO TABS
5.0000 mg | ORAL_TABLET | Freq: Every day | ORAL | 1 refills | Status: DC
Start: 2015-08-10 — End: 2016-02-04

## 2015-08-10 NOTE — ED Triage Notes (Signed)
PT's son reports that she was so weak two days ago that she could not get out of bed. PT is diabetic and on insulin. PT reports her glucose has been up and down.

## 2015-08-10 NOTE — ED Provider Notes (Signed)
[facility]    CSN: [id number] Arrival date & time: 08/10/15  1023  First Provider Contact:  First MD Initiated Contact with Patient 08/10/15 1047        History   Chief Complaint Chief Complaint  Patient presents with  . Fatigue    HPI Nichole Munoz is a 70 y.o. female.   She is a 70 year old woman here for medication refill. Her son is present and asked as interpreter.  She states she has a history of diabetes and hypertension. She has been out of most of her medications for about 2 months. She just ran out of her Lantus in the last day or 2.  She is currently out of her test strips and lancets and has been unable to check her sugar at home.  She reports a several month history of fatigue and diffuse body pain. She reports difficulty sleeping. She denies any dizziness or syncopal episodes. She does report feeling generally weak. She does have a PCP, but they have been unable to get an appointment.      Past Medical History:  Diagnosis Date  . Asthma   . Diabetes mellitus   . Diabetic neuropathy ([place])   . Hypertension   . Obesity   . Seasonal allergies     Patient Active Problem List   Diagnosis Date Noted  . Pain in the chest   . Chest pain 06/12/2015  . Asthma 06/12/2015  . Hypomagnesemia 09/24/2014  . Acute diverticulitis 09/23/2014  . CKD (chronic kidney disease) stage 3, GFR 30-59 ml/min 09/23/2014  . Leukocytosis 09/23/2014  . Sepsis ([place]) 09/23/2014  . Benign essential HTN 09/23/2014  . DM (diabetes mellitus), type 2, uncontrolled, with renal complications ([place]) 0000000  . Diarrhea 09/23/2014  . Diabetic neuropathy ([place]) 04/12/2013    History reviewed. No pertinent surgical history.  OB History    No data available       Home Medications    Prior to Admission medications   Medication Sig Start Date End Date Taking? Authorizing Provider  albuterol (PROVENTIL HFA;VENTOLIN HFA) 108 (90 Base) MCG/ACT inhaler Inhale 2 puffs into the lungs  every 6 (six) hours as needed for wheezing or shortness of breath. 06/14/15   Theodis Blaze, MD  amLODipine (NORVASC) 5 MG tablet Take 1 tablet (5 mg total) by mouth daily. 08/10/15   Melony Overly, MD  beclomethasone (QVAR) 80 MCG/ACT inhaler Inhale 2-4 puffs into the lungs every 4 (four) hours as needed (SOB). 06/14/15   Theodis Blaze, MD  cetirizine (ZYRTEC) 10 MG tablet Take 0.5 tablets (5 mg total) by mouth daily. 10/27/11   Charlann Lange, PA-C  gabapentin (NEURONTIN) 300 MG capsule Take 1 capsule (300 mg total) by mouth 3 (three) times daily. 01/22/12   Virgel Manifold, MD  glipiZIDE (GLUCOTROL XL) 10 MG 24 hr tablet Take 1 tablet (10 mg total) by mouth every morning. 08/10/15   Melony Overly, MD  HYDROcodone-acetaminophen (NORCO/VICODIN) 5-325 MG tablet Take 1-2 tablets by mouth every 6 (six) hours as needed. 06/18/15   Montine Circle, PA-C  hydrOXYzine (ATARAX/VISTARIL) 10 MG tablet Take 10 mg by mouth 2 (two) times daily as needed. For itching. 11/24/14   Historical Provider, MD  hyoscyamine (LEVSIN/SL) 0.125 MG SL tablet Place 1 tablet (0.125 mg total) under the tongue every 4 (four) hours as needed. 02/05/15   April Palumbo, MD  ibuprofen (ADVIL,MOTRIN) 200 MG tablet Take 200 mg by mouth every 6 (six) hours as needed for  headache, mild pain or moderate pain.    Historical Provider, MD  Insulin Glargine (LANTUS SOLOSTAR) 100 UNIT/ML Solostar Pen Inject 20 Units into the skin every morning. 08/10/15   Melony Overly, MD  lisinopril (PRINIVIL,ZESTRIL) 5 MG tablet Take 1 tablet (5 mg total) by mouth at bedtime. 08/10/15   Melony Overly, MD  metFORMIN (GLUCOPHAGE) 1000 MG tablet Take 1 tablet (1,000 mg total) by mouth 2 (two) times daily. 08/10/15   Melony Overly, MD  ondansetron (ZOFRAN ODT) 8 MG disintegrating tablet 8mg  ODT q8 hours prn nausea Patient taking differently: Take 8 mg by mouth every 8 (eight) hours as needed for nausea.  02/05/15   April Palumbo, MD    Family History No family history on  file.  Social History Social History  Substance Use Topics  . Smoking status: Former Research scientist (life sciences)  . Smokeless tobacco: Never Used  . Alcohol use No     Allergies   Other   Review of Systems Review of Systems  Constitutional: Positive for fatigue. Negative for appetite change, fever and unexpected weight change.  Musculoskeletal: Positive for myalgias.  Neurological: Positive for weakness. Negative for dizziness and syncope.     Physical Exam Triage Vital Signs ED Triage Vitals  Enc Vitals Group     BP 08/10/15 1047 (!) 146/108     Pulse Rate 08/10/15 1047 82     Resp 08/10/15 1047 16     Temp 08/10/15 1047 98.5 F (36.9 C)     Temp Source 08/10/15 1047 Oral     SpO2 08/10/15 1047 97 %     Weight --      Height --      Head Circumference --      Peak Flow --      Pain Score 08/10/15 1046 8     Pain Loc --      Pain Edu? --      Excl. in [place]? --    No data found.   Updated Vital Signs BP (!) 146/108 (BP Location: Right Arm)   Pulse 82   Temp 98.5 F (36.9 C) (Oral)   Resp 16   SpO2 97%   Visual Acuity Right Eye Distance:   Left Eye Distance:   Bilateral Distance:    Right Eye Near:   Left Eye Near:    Bilateral Near:     Physical Exam  Constitutional: She is oriented to person, place, and time. She appears well-developed and well-nourished. No distress.  Neck: Neck supple.  Cardiovascular: Normal rate, regular rhythm and normal heart sounds.   No murmur heard. Pulmonary/Chest: Effort normal and breath sounds normal. She has no wheezes. She has no rales.  Neurological: She is alert and oriented to person, place, and time.     UC Treatments / Results  Labs (all labs ordered are listed, but only abnormal results are displayed) Labs Reviewed  POCT I-STAT, CHEM 8 - Abnormal; Notable for the following:       Result Value   Chloride 100 (*)    BUN 22 (*)    Creatinine, Ser 1.10 (*)    Glucose, Bld 232 (*)    All other components within normal limits   POCT I-STAT, CHEM 8 - Abnormal; Notable for the following:    Chloride 100 (*)    BUN 22 (*)    Creatinine, Ser 1.10 (*)    Glucose, Bld 233 (*)    Calcium, Ion 1.24 (*)  All other components within normal limits    EKG  EKG Interpretation None       Radiology No results found.  Procedures Procedures (including critical care time)  Medications Ordered in UC Medications - No data to display   Initial Impression / Assessment and Plan / UC Course  I have reviewed the triage vital signs and the nursing notes.  Pertinent labs & imaging results that were available during my care of the patient were reviewed by me and considered in my medical decision making (see chart for details).  Clinical Course    Two-month supply of diabetic and hypertensive medications provided. Discussed importance of following up with PCP for additional monitoring. I suspect her fatigue is at least partially coming from elevated blood sugars.  Final Clinical Impressions(s) / UC Diagnoses   Final diagnoses:  Type 2 diabetes mellitus without complication, with long-term current use of insulin [facility])  Essential hypertension    New Prescriptions Current Discharge Medication List       Melony Overly, MD 08/10/15 1157

## 2015-08-10 NOTE — Discharge Instructions (Signed)
I have provided a two-month supply of her medications. I suspect her fatigue and weakness is coming from elevated blood sugars. Please make an appointment to see her primary care doctor as she needs monitoring that I cannot provide here.

## 2015-09-13 ENCOUNTER — Emergency Department (HOSPITAL_COMMUNITY)
Admission: EM | Admit: 2015-09-13 | Discharge: 2015-09-13 | Disposition: A | Discharge status: Home / Self Care | Payer: Medicare Other | Attending: Emergency Medicine | Admitting: Emergency Medicine

## 2015-09-13 ENCOUNTER — Emergency Department (HOSPITAL_COMMUNITY): Payer: Medicare Other

## 2015-09-13 ENCOUNTER — Encounter (HOSPITAL_COMMUNITY): Payer: Self-pay

## 2015-09-13 DIAGNOSIS — N183 Chronic kidney disease, stage 3 (moderate): Secondary | ICD-10-CM | POA: Diagnosis not present

## 2015-09-13 DIAGNOSIS — R079 Chest pain, unspecified: Secondary | ICD-10-CM | POA: Diagnosis not present

## 2015-09-13 DIAGNOSIS — J45909 Unspecified asthma, uncomplicated: Secondary | ICD-10-CM | POA: Diagnosis not present

## 2015-09-13 DIAGNOSIS — M25512 Pain in left shoulder: Secondary | ICD-10-CM

## 2015-09-13 DIAGNOSIS — I129 Hypertensive chronic kidney disease with stage 1 through stage 4 chronic kidney disease, or unspecified chronic kidney disease: Secondary | ICD-10-CM | POA: Diagnosis not present

## 2015-09-13 DIAGNOSIS — Z79899 Other long term (current) drug therapy: Secondary | ICD-10-CM | POA: Insufficient documentation

## 2015-09-13 DIAGNOSIS — Z87891 Personal history of nicotine dependence: Secondary | ICD-10-CM | POA: Insufficient documentation

## 2015-09-13 DIAGNOSIS — E1122 Type 2 diabetes mellitus with diabetic chronic kidney disease: Secondary | ICD-10-CM | POA: Diagnosis not present

## 2015-09-13 DIAGNOSIS — E1140 Type 2 diabetes mellitus with diabetic neuropathy, unspecified: Secondary | ICD-10-CM | POA: Insufficient documentation

## 2015-09-13 DIAGNOSIS — Z794 Long term (current) use of insulin: Secondary | ICD-10-CM | POA: Insufficient documentation

## 2015-09-13 DIAGNOSIS — Z7984 Long term (current) use of oral hypoglycemic drugs: Secondary | ICD-10-CM | POA: Diagnosis not present

## 2015-09-13 DIAGNOSIS — M25511 Pain in right shoulder: Secondary | ICD-10-CM | POA: Diagnosis not present

## 2015-09-13 LAB — CBC
HEMATOCRIT: 37.2 % (ref 36.0–46.0)
HEMOGLOBIN: 12.7 g/dL (ref 12.0–15.0)
MCH: 29.2 pg (ref 26.0–34.0)
MCHC: 34.1 g/dL (ref 30.0–36.0)
MCV: 85.5 fL (ref 78.0–100.0)
Platelets: 216 10*3/uL (ref 150–400)
RBC: 4.35 MIL/uL (ref 3.87–5.11)
RDW: 12.3 % (ref 11.5–15.5)
WBC: 9.8 10*3/uL (ref 4.0–10.5)

## 2015-09-13 LAB — BASIC METABOLIC PANEL
ANION GAP: 12 (ref 5–15)
BUN: 27 mg/dL — ABNORMAL HIGH (ref 6–20)
CHLORIDE: 102 mmol/L (ref 101–111)
CO2: 25 mmol/L (ref 22–32)
Calcium: 9.5 mg/dL (ref 8.9–10.3)
Creatinine, Ser: 1.05 mg/dL — ABNORMAL HIGH (ref 0.44–1.00)
GFR calc non Af Amer: 52 mL/min — ABNORMAL LOW (ref >60)
Glucose, Bld: 174 mg/dL — ABNORMAL HIGH (ref 65–99)
Potassium: 4.1 mmol/L (ref 3.5–5.1)
Sodium: 139 mmol/L (ref 135–145)

## 2015-09-13 LAB — I-STAT TROPONIN, ED
TROPONIN I, POC: 0 ng/mL (ref 0.00–0.08)
Troponin i, poc: 0.01 ng/mL (ref 0.00–0.08)

## 2015-09-13 MED ORDER — MELOXICAM 15 MG PO TABS
15.0000 mg | ORAL_TABLET | Freq: Once | ORAL | Status: AC
  Administered 2015-09-13: 15 mg via ORAL
  Filled 2015-09-13: qty 1

## 2015-09-13 MED ORDER — MELOXICAM 7.5 MG PO TABS: 15.0000 mg | ORAL_TABLET | Freq: Every day | ORAL | 0 refills | Status: DC

## 2015-09-13 MED ORDER — OXYCODONE-ACETAMINOPHEN 5-325 MG PO TABS
1.0000 | ORAL_TABLET | Freq: Once | ORAL | Status: AC
  Administered 2015-09-13: 1 via ORAL
  Filled 2015-09-13: qty 1

## 2015-09-13 MED ORDER — OXYCODONE-ACETAMINOPHEN 5-325 MG PO TABS: 1.0000 | ORAL_TABLET | ORAL | 0 refills | Status: DC | PRN

## 2015-09-13 NOTE — ED Provider Notes (Signed)
[facility] DEPT Provider Note   CSN: [id number] Arrival date & time: 09/13/15  0915     History   Chief Complaint Chief Complaint  Patient presents with  . Arm Pain    HPI Nichole Munoz is a 70 y.o. female.  The history is provided by the patient and medical records.    70 year old female with history of asthma, diabetes, diabetic neuropathy, hypertension, seasonal allergies, presenting to the ED for bilateral shoulder pain. Patient son is present at the bedside and functioning as interpreter as patient speaks [place]. He reports she has been having this pain constantly for the past 2 weeks. She reports some pain to her chest, worse with movement. She reports pain in her shoulders is far worse than her chest pain. She is not having any shortness of breath, nausea, vomiting, diaphoresis, dizziness, or weakness. 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 Patient son reports they've been trying to see her primary care doctor, however they were told it would be several months. She has been taking Advil at home with some mild relief. She has not had any new injury, trauma, falls. No prior shoulder surgeries in the past. She does not have any known cardiac history. She is not a smoker.  Past Medical History:  Diagnosis Date  . Asthma   . Diabetes mellitus   . Diabetic neuropathy ([place])   . Hypertension   . Obesity   . Seasonal allergies     Patient Active Problem List   Diagnosis Date Noted  . Pain in the chest   . Chest pain 06/12/2015  . Asthma 06/12/2015  . Hypomagnesemia 09/24/2014  . Acute diverticulitis 09/23/2014  . CKD (chronic kidney disease) stage 3, GFR 30-59 ml/min 09/23/2014  . Leukocytosis 09/23/2014  . Sepsis ([place]) 09/23/2014  . Benign essential HTN 09/23/2014  . DM (diabetes mellitus), type 2, uncontrolled, with renal complications ([place]) 02/54/2706  . Diarrhea 09/23/2014  . Diabetic neuropathy ([place]) 04/12/2013    History reviewed. No pertinent  surgical history.  OB History    No data available       Home Medications    Prior to Admission medications   Medication Sig Start Date End Date Taking? Authorizing Provider  albuterol (PROVENTIL HFA;VENTOLIN HFA) 108 (90 Base) MCG/ACT inhaler Inhale 2 puffs into the lungs every 6 (six) hours as needed for wheezing or shortness of breath. 06/14/15   Theodis Blaze, MD  amLODipine (NORVASC) 5 MG tablet Take 1 tablet (5 mg total) by mouth daily. 08/10/15   Melony Overly, MD  beclomethasone (QVAR) 80 MCG/ACT inhaler Inhale 2-4 puffs into the lungs every 4 (four) hours as needed (SOB). 06/14/15   Theodis Blaze, MD  cetirizine (ZYRTEC) 10 MG tablet Take 0.5 tablets (5 mg total) by mouth daily. 10/27/11   Charlann Lange, PA-C  gabapentin (NEURONTIN) 300 MG capsule Take 1 capsule (300 mg total) by mouth 3 (three) times daily. 01/22/12   Virgel Manifold, MD  glipiZIDE (GLUCOTROL XL) 10 MG 24 hr tablet Take 1 tablet (10 mg total) by mouth every morning. 08/10/15   Melony Overly, MD  HYDROcodone-acetaminophen (NORCO/VICODIN) 5-325 MG tablet Take 1-2 tablets by mouth every 6 (six) hours as needed. 06/18/15   Montine Circle, PA-C  hydrOXYzine (ATARAX/VISTARIL) 10 MG tablet Take 10 mg by mouth 2 (two) times daily as needed. For itching. 11/24/14   Historical Provider, MD  hyoscyamine (LEVSIN/SL) 0.125 MG SL tablet Place 1 tablet (0.125 mg total) under the tongue  every 4 (four) hours as needed. 02/05/15   April Palumbo, MD  ibuprofen (ADVIL,MOTRIN) 200 MG tablet Take 200 mg by mouth every 6 (six) hours as needed for headache, mild pain or moderate pain.    Historical Provider, MD  Insulin Glargine (LANTUS SOLOSTAR) 100 UNIT/ML Solostar Pen Inject 20 Units into the skin every morning. 08/10/15   Melony Overly, MD  lisinopril (PRINIVIL,ZESTRIL) 5 MG tablet Take 1 tablet (5 mg total) by mouth at bedtime. 08/10/15   Melony Overly, MD  metFORMIN (GLUCOPHAGE) 1000 MG tablet Take 1 tablet (1,000 mg total) by mouth 2 (two) times daily.  08/10/15   Melony Overly, MD  ondansetron (ZOFRAN ODT) 8 MG disintegrating tablet 8mg  ODT q8 hours prn nausea Patient taking differently: Take 8 mg by mouth every 8 (eight) hours as needed for nausea.  02/05/15   April Palumbo, MD    Family History No family history on file.  Social History Social History  Substance Use Topics  . Smoking status: Former Research scientist (life sciences)  . Smokeless tobacco: Never Used  . Alcohol use No     Allergies   Other   Review of Systems Review of Systems  Cardiovascular: Positive for chest pain.  Musculoskeletal: Positive for arthralgias.  All other systems reviewed and are negative.    Physical Exam Updated Vital Signs BP 195/84 (BP Location: Right Arm)   Pulse 82   Temp 98.1 F (36.7 C) (Oral)   Resp 18   Wt 62.1 kg   SpO2 98%   BMI 22.08 kg/m   Physical Exam  Constitutional: She is oriented to person, place, and time. She appears well-developed and well-nourished.  HENT:  Head: Normocephalic and atraumatic.  Mouth/Throat: Oropharynx is clear and moist.  Eyes: Conjunctivae and EOM are normal. Pupils are equal, round, and reactive to light.  Neck: Normal range of motion.  Cardiovascular: Normal rate, regular rhythm and normal heart sounds.   Pulmonary/Chest: Effort normal and breath sounds normal.  Abdominal: Soft. Bowel sounds are normal.  Musculoskeletal: Normal range of motion.  Both shoulders normal in appearance bilaterally without gross deformity or swelling, no skin discoloration or warmth to touch; full range of motion, some pain elicited with full flexion above head; normal grip strength bilaterally; hands are warm and well perfused No leg edema; no calf swelling or tenderness  Neurological: She is alert and oriented to person, place, and time.  Skin: Skin is warm and dry.  Psychiatric: She has a normal mood and affect.  Nursing note and vitals reviewed.    ED Treatments / Results  Labs (all labs ordered are listed, but only abnormal  results are displayed) Labs Reviewed  BASIC METABOLIC PANEL - Abnormal; Notable for the following:       Result Value   Glucose, Bld 174 (*)    BUN 27 (*)    Creatinine, Ser 1.05 (*)    GFR calc non Af Amer 52 (*)    All other components within normal limits  CBC  I-STAT TROPOININ, ED  I-STAT TROPOININ, ED    EKG  EKG Interpretation None       Radiology Dg Chest 2 View  Result Date: 09/13/2015 CLINICAL DATA:  Right and left arm pain.  Chest pain. EXAM: CHEST  2 VIEW COMPARISON:  06/11/2015 FINDINGS: The heart size and mediastinal contours are within normal limits. Both lungs are clear. The visualized skeletal structures are unremarkable. IMPRESSION: No active cardiopulmonary disease. Electronically Signed   By: Elbert Ewings  Patel   On: 09/13/2015 10:13    Procedures Procedures (including critical care time)  Medications Ordered in ED Medications  oxyCODONE-acetaminophen (PERCOCET/ROXICET) 5-325 MG per tablet 1 tablet (1 tablet Oral Given 09/13/15 1118)  meloxicam (MOBIC) tablet 15 mg (15 mg Oral Given 09/13/15 1118)     Initial Impression / Assessment and Plan / ED Course  I have reviewed the triage vital signs and the nursing notes.  Pertinent labs & imaging results that were available during my care of the patient were reviewed by me and considered in my medical decision making (see chart for details).  Clinical Course   70 year old female here with bilateral shoulder pain and some mild chest pain over the past 2 weeks. She has had prior ED visits for similar and this seems to be an ongoing issue. Son does report history of arthritis. Has been trying to see her primary care doctor for this, however was told she had to wait 3 months for an appointment. She is afebrile and nontoxic here. She has no deformities or swelling of either shoulder. There is no overlying erythema or warmth to touch. Do not clinically suspect septic joint. Her EKG is unchanged from prior. Labwork is  reassuring. Chest x-ray is clear.  Patient's pain seems atypical, however she does have some cardiac RF.  Delta troponin remains normal.  Do not suspect acute ACS, PE, dissection, or other acute cardiac event at this time.  Feel her symptoms are more related to her shoulder pain/arthritis.  Treated here with dose of percocet and mobic with improvement.  Will d/c home with same.  Given referral to orthopedics if symptoms not improving.  Discussed plan with patient and son, they both acknowledged understanding and agreed with plan of care.  Return precautions given for new or worsening symptoms.  Case discussed with attending physician, Dr. Roderic Palau, who agrees with assessment and plan of care.  Final Clinical Impressions(s) / ED Diagnoses   Final diagnoses:  Bilateral shoulder pain  Chest pain, unspecified chest pain type    New Prescriptions Discharge Medication List as of 09/13/2015  2:52 PM    START taking these medications   Details  meloxicam (MOBIC) 7.5 MG tablet Take 2 tablets (15 mg total) by mouth daily., Starting Thu 09/13/2015, Print    oxyCODONE-acetaminophen (PERCOCET/ROXICET) 5-325 MG tablet Take 1 tablet by mouth every 4 (four) hours as needed., Starting Thu 09/13/2015, Print         Larene Pickett, PA-C 09/13/15 1529    Milton Ferguson, MD 09/13/15 1537

## 2015-09-13 NOTE — ED Triage Notes (Signed)
Lt. Shoulder pain with lt. Arm pain intermittent for over 2 weeks.  She has intermittent chest pain. She denies any injuries.  She also reports that nothing relieves the pain or increases the pain.  She denies any sob, n/v/d.  Skin is warm and dry.  Pt.'s son lives with her and also interrupts for her.  No distress in triage, ECG done in triage.

## 2015-09-13 NOTE — ED Notes (Signed)
Patient transported to X-ray 

## 2015-09-13 NOTE — Discharge Instructions (Signed)
Your cardiac work-up today was normal. Take the prescribed medication as directed.  Do not take motrin, aleve, advil, etc with the mobic.  This can cause stomach upset. Follow-up with Dr. Percell Miller if you continue having issues with your shoulders. Return to the ED for new or worsening symptoms.

## 2015-10-06 ENCOUNTER — Emergency Department (HOSPITAL_COMMUNITY)
Admission: EM | Admit: 2015-10-06 | Discharge: 2015-10-06 | Disposition: A | Discharge status: Home / Self Care | Payer: Medicare Other | Attending: Emergency Medicine | Admitting: Emergency Medicine

## 2015-10-06 ENCOUNTER — Emergency Department (HOSPITAL_COMMUNITY): Payer: Medicare Other

## 2015-10-06 ENCOUNTER — Encounter (HOSPITAL_COMMUNITY): Payer: Self-pay | Admitting: Emergency Medicine

## 2015-10-06 DIAGNOSIS — E1122 Type 2 diabetes mellitus with diabetic chronic kidney disease: Secondary | ICD-10-CM | POA: Diagnosis not present

## 2015-10-06 DIAGNOSIS — Z7984 Long term (current) use of oral hypoglycemic drugs: Secondary | ICD-10-CM | POA: Insufficient documentation

## 2015-10-06 DIAGNOSIS — R531 Weakness: Secondary | ICD-10-CM | POA: Diagnosis present

## 2015-10-06 DIAGNOSIS — E1140 Type 2 diabetes mellitus with diabetic neuropathy, unspecified: Secondary | ICD-10-CM | POA: Insufficient documentation

## 2015-10-06 DIAGNOSIS — Z794 Long term (current) use of insulin: Secondary | ICD-10-CM | POA: Diagnosis not present

## 2015-10-06 DIAGNOSIS — M25512 Pain in left shoulder: Secondary | ICD-10-CM | POA: Insufficient documentation

## 2015-10-06 DIAGNOSIS — I129 Hypertensive chronic kidney disease with stage 1 through stage 4 chronic kidney disease, or unspecified chronic kidney disease: Secondary | ICD-10-CM | POA: Diagnosis not present

## 2015-10-06 DIAGNOSIS — Z87891 Personal history of nicotine dependence: Secondary | ICD-10-CM | POA: Insufficient documentation

## 2015-10-06 DIAGNOSIS — J45909 Unspecified asthma, uncomplicated: Secondary | ICD-10-CM | POA: Diagnosis not present

## 2015-10-06 DIAGNOSIS — Z79899 Other long term (current) drug therapy: Secondary | ICD-10-CM | POA: Diagnosis not present

## 2015-10-06 DIAGNOSIS — N183 Chronic kidney disease, stage 3 (moderate): Secondary | ICD-10-CM | POA: Diagnosis not present

## 2015-10-06 LAB — COMPREHENSIVE METABOLIC PANEL
ALK PHOS: 66 U/L (ref 38–126)
ALT: 10 U/L — AB (ref 14–54)
ANION GAP: 9 (ref 5–15)
AST: 21 U/L (ref 15–41)
Albumin: 3.7 g/dL (ref 3.5–5.0)
BUN: 22 mg/dL — ABNORMAL HIGH (ref 6–20)
CALCIUM: 9.1 mg/dL (ref 8.9–10.3)
CO2: 25 mmol/L (ref 22–32)
CREATININE: 1.2 mg/dL — AB (ref 0.44–1.00)
Chloride: 102 mmol/L (ref 101–111)
GFR calc Af Amer: 51 mL/min — ABNORMAL LOW (ref >60)
GFR calc non Af Amer: 44 mL/min — ABNORMAL LOW (ref >60)
GLUCOSE: 151 mg/dL — AB (ref 65–99)
Potassium: 4.4 mmol/L (ref 3.5–5.1)
Sodium: 136 mmol/L (ref 135–145)
Total Bilirubin: 0.8 mg/dL (ref 0.3–1.2)
Total Protein: 8.1 g/dL (ref 6.5–8.1)

## 2015-10-06 LAB — CBC WITH DIFFERENTIAL/PLATELET
BASOS PCT: 1 %
Basophils Absolute: 0.1 10*3/uL (ref 0.0–0.1)
Eosinophils Absolute: 1.6 10*3/uL — ABNORMAL HIGH (ref 0.0–0.7)
Eosinophils Relative: 11 %
HEMATOCRIT: 36.5 % (ref 36.0–46.0)
Hemoglobin: 11.9 g/dL — ABNORMAL LOW (ref 12.0–15.0)
Lymphocytes Relative: 29 %
Lymphs Abs: 4.4 10*3/uL — ABNORMAL HIGH (ref 0.7–4.0)
MCH: 28.8 pg (ref 26.0–34.0)
MCHC: 32.6 g/dL (ref 30.0–36.0)
MCV: 88.4 fL (ref 78.0–100.0)
MONO ABS: 0.9 10*3/uL (ref 0.1–1.0)
MONOS PCT: 6 %
NEUTROS ABS: 8 10*3/uL — AB (ref 1.7–7.7)
Neutrophils Relative %: 53 %
Platelets: 259 10*3/uL (ref 150–400)
RBC: 4.13 MIL/uL (ref 3.87–5.11)
RDW: 12.3 % (ref 11.5–15.5)
WBC: 14.8 10*3/uL — ABNORMAL HIGH (ref 4.0–10.5)

## 2015-10-06 LAB — URINALYSIS, ROUTINE W REFLEX MICROSCOPIC
BILIRUBIN URINE: NEGATIVE
GLUCOSE, UA: NEGATIVE mg/dL
HGB URINE DIPSTICK: NEGATIVE
Ketones, ur: NEGATIVE mg/dL
Leukocytes, UA: NEGATIVE
Nitrite: NEGATIVE
PH: 6.5 (ref 5.0–8.0)
Protein, ur: NEGATIVE mg/dL
SPECIFIC GRAVITY, URINE: 1.007 (ref 1.005–1.030)

## 2015-10-06 LAB — MAGNESIUM: Magnesium: 1 mg/dL — ABNORMAL LOW (ref 1.7–2.4)

## 2015-10-06 MED ORDER — SODIUM CHLORIDE 0.9 % IV BOLUS (SEPSIS)
1000.0000 mL | Freq: Once | INTRAVENOUS | Status: AC
  Administered 2015-10-06: 1000 mL via INTRAVENOUS

## 2015-10-06 MED ORDER — MAGNESIUM SULFATE IN D5W 1-5 GM/100ML-% IV SOLN
1.0000 g | INTRAVENOUS | Status: AC
  Administered 2015-10-06: 1 g via INTRAVENOUS
  Filled 2015-10-06: qty 100

## 2015-10-06 MED ORDER — MECLIZINE HCL 25 MG PO TABS
25.0000 mg | ORAL_TABLET | Freq: Once | ORAL | Status: AC
  Administered 2015-10-06: 25 mg via ORAL
  Filled 2015-10-06: qty 1

## 2015-10-06 NOTE — ED Notes (Signed)
Pt ambulated to bedside commode with steady gait.

## 2015-10-06 NOTE — ED Notes (Signed)
Patient verbalized understanding of discharge instructions and denies any further needs or questions at this time. VS stable. Patient ambulatory with steady gait. RN escorted to ED entrance in wheelchair.

## 2015-10-06 NOTE — Discharge Instructions (Signed)
As discussed, your evaluation today has been largely reassuring.  But, it is important that you monitor your condition carefully, and do not hesitate to return to the ED if you develop new, or concerning changes in your condition. ? ?Otherwise, please follow-up with your physician for appropriate ongoing care. ? ?

## 2015-10-06 NOTE — ED Provider Notes (Signed)
[facility] DEPT Provider Note   CSN: [id number] Arrival date & time: 10/06/15  1836     History   Chief Complaint No chief complaint on file.   HPI Nichole Munoz is a 70 y.o. female.  HPI  Patient presents with her son who assists with history of present illness, and serves as a Optometrist. Patient states that she prefers this arrangement. He notes that over the past month the patient has had persistent generalized discomfort, described as pins and needles throughout her body, as well as ongoing weakness. In addition, there is focal pain throughout the left shoulder. Notably, the patient was here about one month ago for similar concerns, and on discharge was supposed to follow-up with orthopedics, primary care. Primary care visit is scheduled for next week or Patient went to see orthopedics, but was deferred secondary to insurance status. She notes no other new notable changes beyond increasing dizziness, with ambulation, but no new syncope, chest pain, nausea, vomiting, diarrhea. No clear alleviating or exacerbating factors beyond activity.  Past Medical History:  Diagnosis Date  . Asthma   . Diabetes mellitus   . Diabetic neuropathy ([place])   . Hypertension   . Obesity   . Seasonal allergies     Patient Active Problem List   Diagnosis Date Noted  . Pain in the chest   . Chest pain 06/12/2015  . Asthma 06/12/2015  . Hypomagnesemia 09/24/2014  . Acute diverticulitis 09/23/2014  . CKD (chronic kidney disease) stage 3, GFR 30-59 ml/min 09/23/2014  . Leukocytosis 09/23/2014  . Sepsis ([place]) 09/23/2014  . Benign essential HTN 09/23/2014  . DM (diabetes mellitus), type 2, uncontrolled, with renal complications ([place]) 89/38/1017  . Diarrhea 09/23/2014  . Diabetic neuropathy ([place]) 04/12/2013    History reviewed. No pertinent surgical history.  OB History    No data available       Home Medications    Prior to Admission medications   Medication Sig Start Date End  Date Taking? Authorizing Provider  albuterol (PROVENTIL HFA;VENTOLIN HFA) 108 (90 Base) MCG/ACT inhaler Inhale 2 puffs into the lungs every 6 (six) hours as needed for wheezing or shortness of breath. 06/14/15   Theodis Blaze, MD  amLODipine (NORVASC) 5 MG tablet Take 1 tablet (5 mg total) by mouth daily. 08/10/15   Melony Overly, MD  beclomethasone (QVAR) 80 MCG/ACT inhaler Inhale 2-4 puffs into the lungs every 4 (four) hours as needed (SOB). 06/14/15   Theodis Blaze, MD  cetirizine (ZYRTEC) 10 MG tablet Take 0.5 tablets (5 mg total) by mouth daily. 10/27/11   Charlann Lange, PA-C  gabapentin (NEURONTIN) 300 MG capsule Take 1 capsule (300 mg total) by mouth 3 (three) times daily. 01/22/12   Virgel Manifold, MD  glipiZIDE (GLUCOTROL XL) 10 MG 24 hr tablet Take 1 tablet (10 mg total) by mouth every morning. 08/10/15   Melony Overly, MD  HYDROcodone-acetaminophen (NORCO/VICODIN) 5-325 MG tablet Take 1-2 tablets by mouth every 6 (six) hours as needed. Patient not taking: Reported on 09/13/2015 06/18/15   Montine Circle, PA-C  hydrOXYzine (ATARAX/VISTARIL) 10 MG tablet Take 10 mg by mouth 2 (two) times daily as needed. For itching. 11/24/14   Historical Provider, MD  Insulin Glargine (LANTUS SOLOSTAR) 100 UNIT/ML Solostar Pen Inject 20 Units into the skin every morning. Patient taking differently: Inject 5-10 Units into the skin 2 (two) times daily.  08/10/15   Melony Overly, MD  lisinopril (PRINIVIL,ZESTRIL) 5 MG tablet Take 1 tablet (5 mg  total) by mouth at bedtime. 08/10/15   Melony Overly, MD  meloxicam (MOBIC) 7.5 MG tablet Take 2 tablets (15 mg total) by mouth daily. 09/13/15   Larene Pickett, PA-C  metFORMIN (GLUCOPHAGE) 1000 MG tablet Take 1 tablet (1,000 mg total) by mouth 2 (two) times daily. 08/10/15   Melony Overly, MD  ondansetron (ZOFRAN ODT) 8 MG disintegrating tablet 8mg  ODT q8 hours prn nausea Patient taking differently: Take 8 mg by mouth every 8 (eight) hours as needed for nausea.  02/05/15   April Palumbo, MD    oxyCODONE-acetaminophen (PERCOCET/ROXICET) 5-325 MG tablet Take 1 tablet by mouth every 4 (four) hours as needed. 09/13/15   Larene Pickett, PA-C    Family History History reviewed. No pertinent family history.  Social History Social History  Substance Use Topics  . Smoking status: Former Research scientist (life sciences)  . Smokeless tobacco: Never Used  . Alcohol use No     Allergies   Other   Review of Systems Review of Systems  Constitutional:       Per HPI, otherwise negative  HENT:       Per HPI, otherwise negative  Respiratory:       Per HPI, otherwise negative  Cardiovascular:       Per HPI, otherwise negative  Gastrointestinal: Negative for vomiting.  Endocrine:       Negative aside from HPI  Genitourinary:       Neg aside from HPI   Musculoskeletal:       Per HPI, otherwise negative  Skin: Negative.   Neurological: Positive for weakness and numbness. Negative for syncope.     Physical Exam Updated Vital Signs BP 143/76 (BP Location: Right Arm)   Pulse 101   Temp 99.2 F (37.3 C) (Oral)   Resp 18   SpO2 98%   Physical Exam  Constitutional: She is oriented to person, place, and time. She appears well-developed and well-nourished.  HENT:  Head: Normocephalic and atraumatic.  Mouth/Throat: Oropharynx is clear and moist.  Eyes: Conjunctivae and EOM are normal. Pupils are equal, round, and reactive to light.  Neck: Normal range of motion.  Cardiovascular: Normal rate, regular rhythm and normal heart sounds.   Pulmonary/Chest: Effort normal and breath sounds normal.  Abdominal: Soft. Bowel sounds are normal.  Musculoskeletal: Normal range of motion.  Both shoulders normal in appearance bilaterally without gross deformity or swelling, no skin discoloration or warmth to touch; full range of motion, some pain elicited with ROM eval; hands are warm and well perfused  Neurological: She is alert and oriented to person, place, and time.  Skin: Skin is warm and dry.  Psychiatric: She  has a normal mood and affect.  Nursing note and vitals reviewed.    ED Treatments / Results  Labs (all labs ordered are listed, but only abnormal results are displayed) Labs Reviewed  COMPREHENSIVE METABOLIC PANEL - Abnormal; Notable for the following:       Result Value   Glucose, Bld 151 (*)    BUN 22 (*)    Creatinine, Ser 1.20 (*)    ALT 10 (*)    GFR calc non Af Amer 44 (*)    GFR calc Af Amer 51 (*)    All other components within normal limits  CBC WITH DIFFERENTIAL/PLATELET - Abnormal; Notable for the following:    WBC 14.8 (*)    Hemoglobin 11.9 (*)    Neutro Abs 8.0 (*)    Lymphs Abs 4.4 (*)  Eosinophils Absolute 1.6 (*)    All other components within normal limits  MAGNESIUM - Abnormal; Notable for the following:    Magnesium 1.0 (*)    All other components within normal limits  URINALYSIS, ROUTINE W REFLEX MICROSCOPIC (NOT AT [facility])    Chart review notable for visit with similar concerns almost 1 month ago. Results of that time were reassuring, including serial negative troponin, nonischemic EKG, unremarkable x-rays.   Procedures Procedures (including critical care time)  Medications Ordered in ED Medications  sodium chloride 0.9 % bolus 1,000 mL (not administered)  meclizine (ANTIVERT) tablet 25 mg (not administered)     Initial Impression / Assessment and Plan / ED Course  I have reviewed the triage vital signs and the nursing notes.  Pertinent labs & imaging results that were available during my care of the patient were reviewed by me and considered in my medical decision making (see chart for details).  Clinical Course    11:00 PM Patient appears calm, states that she feels somewhat better. She and her son are aware of all findings, including hypomagnesemia. You're also aware of the need to follow-up with primary care as scheduled next week. In addition, the patient will follow up with orthopedics, a different group, to facilitate care in spite  of her insurance status.   Final Clinical Impressions(s) / ED Diagnoses  Female with ongoing weakness presents with concern of this, as well as ongoing left shoulder pain. Here she is awake and alert, hemodynamically stable, in no distress, neurologically intact. Evaluation here notable for mild leukocytosis, and hypomagnesemia. Patient received fluid resuscitation, magnesium repletion, with some improvement in her condition. Patient progress for discharge with close outpatient follow-up, and orthopedics evaluation of her shoulder.    Carmin Muskrat, MD 10/06/15 [phone]

## 2015-10-06 NOTE — ED Notes (Signed)
Pt transported to xray 

## 2015-10-06 NOTE — ED Triage Notes (Signed)
Here for left shoulder pain and body aches for several weeks. It improves with the medication prescribed in the ED before, but the body aches return when it wears off. Reports the symptoms are not better or worse. No distress in triage. Denies SOB, N/V, or CP.

## 2015-10-19 ENCOUNTER — Other Ambulatory Visit (HOSPITAL_COMMUNITY): Payer: Self-pay | Admitting: Internal Medicine

## 2015-10-19 ENCOUNTER — Ambulatory Visit (HOSPITAL_COMMUNITY)
Admission: RE | Admit: 2015-10-19 | Discharge: 2015-10-19 | Disposition: A | Discharge status: Home / Self Care | Payer: Medicare Other | Source: Ambulatory Visit | Attending: Internal Medicine | Admitting: Internal Medicine

## 2015-10-19 DIAGNOSIS — M25512 Pain in left shoulder: Secondary | ICD-10-CM

## 2015-12-12 ENCOUNTER — Emergency Department (HOSPITAL_COMMUNITY)
Admission: EM | Admit: 2015-12-12 | Discharge: 2015-12-12 | Disposition: A | Discharge status: Home / Self Care | Payer: Medicare Other | Attending: Emergency Medicine | Admitting: Emergency Medicine

## 2015-12-12 ENCOUNTER — Encounter (HOSPITAL_COMMUNITY): Payer: Self-pay | Admitting: Emergency Medicine

## 2015-12-12 ENCOUNTER — Emergency Department (HOSPITAL_COMMUNITY): Payer: Medicare Other

## 2015-12-12 DIAGNOSIS — M19012 Primary osteoarthritis, left shoulder: Secondary | ICD-10-CM | POA: Insufficient documentation

## 2015-12-12 DIAGNOSIS — N183 Chronic kidney disease, stage 3 (moderate): Secondary | ICD-10-CM | POA: Insufficient documentation

## 2015-12-12 DIAGNOSIS — Z87891 Personal history of nicotine dependence: Secondary | ICD-10-CM | POA: Diagnosis not present

## 2015-12-12 DIAGNOSIS — Z79899 Other long term (current) drug therapy: Secondary | ICD-10-CM | POA: Diagnosis not present

## 2015-12-12 DIAGNOSIS — E1140 Type 2 diabetes mellitus with diabetic neuropathy, unspecified: Secondary | ICD-10-CM | POA: Insufficient documentation

## 2015-12-12 DIAGNOSIS — I129 Hypertensive chronic kidney disease with stage 1 through stage 4 chronic kidney disease, or unspecified chronic kidney disease: Secondary | ICD-10-CM | POA: Diagnosis not present

## 2015-12-12 DIAGNOSIS — J45909 Unspecified asthma, uncomplicated: Secondary | ICD-10-CM | POA: Diagnosis not present

## 2015-12-12 DIAGNOSIS — M1990 Unspecified osteoarthritis, unspecified site: Secondary | ICD-10-CM

## 2015-12-12 DIAGNOSIS — Z7984 Long term (current) use of oral hypoglycemic drugs: Secondary | ICD-10-CM | POA: Diagnosis not present

## 2015-12-12 DIAGNOSIS — M25512 Pain in left shoulder: Secondary | ICD-10-CM | POA: Diagnosis present

## 2015-12-12 LAB — CBC WITH DIFFERENTIAL/PLATELET
Basophils Absolute: 0.1 10*3/uL (ref 0.0–0.1)
Basophils Relative: 0 %
Eosinophils Absolute: 1 10*3/uL — ABNORMAL HIGH (ref 0.0–0.7)
Eosinophils Relative: 8 %
HEMATOCRIT: 35.9 % — AB (ref 36.0–46.0)
Hemoglobin: 12.5 g/dL (ref 12.0–15.0)
LYMPHS PCT: 29 %
Lymphs Abs: 3.6 10*3/uL (ref 0.7–4.0)
MCH: 29.9 pg (ref 26.0–34.0)
MCHC: 34.8 g/dL (ref 30.0–36.0)
MCV: 85.9 fL (ref 78.0–100.0)
MONO ABS: 0.5 10*3/uL (ref 0.1–1.0)
MONOS PCT: 4 %
NEUTROS ABS: 7.5 10*3/uL (ref 1.7–7.7)
Neutrophils Relative %: 59 %
Platelets: 205 10*3/uL (ref 150–400)
RBC: 4.18 MIL/uL (ref 3.87–5.11)
RDW: 12.6 % (ref 11.5–15.5)
WBC: 12.6 10*3/uL — ABNORMAL HIGH (ref 4.0–10.5)

## 2015-12-12 LAB — BASIC METABOLIC PANEL
ANION GAP: 9 (ref 5–15)
BUN: 35 mg/dL — ABNORMAL HIGH (ref 6–20)
CALCIUM: 9.1 mg/dL (ref 8.9–10.3)
CHLORIDE: 103 mmol/L (ref 101–111)
CO2: 28 mmol/L (ref 22–32)
CREATININE: 1.2 mg/dL — AB (ref 0.44–1.00)
GFR calc Af Amer: 51 mL/min — ABNORMAL LOW (ref >60)
GFR calc non Af Amer: 44 mL/min — ABNORMAL LOW (ref >60)
GLUCOSE: 164 mg/dL — AB (ref 65–99)
Potassium: 4.3 mmol/L (ref 3.5–5.1)
Sodium: 140 mmol/L (ref 135–145)

## 2015-12-12 MED ORDER — ONDANSETRON HCL 4 MG/2ML IJ SOLN
4.0000 mg | Freq: Once | INTRAMUSCULAR | Status: AC
  Administered 2015-12-12: 4 mg via INTRAVENOUS
  Filled 2015-12-12: qty 2

## 2015-12-12 MED ORDER — HYDROMORPHONE HCL 1 MG/ML IJ SOLN
1.0000 mg | Freq: Once | INTRAMUSCULAR | Status: AC
  Administered 2015-12-12: 1 mg via INTRAVENOUS
  Filled 2015-12-12: qty 1

## 2015-12-12 MED ORDER — TRAMADOL HCL 50 MG PO TABS: 50.0000 mg | ORAL_TABLET | Freq: Four times a day (QID) | ORAL | 0 refills | Status: DC | PRN

## 2015-12-12 NOTE — ED Notes (Signed)
Discharge instructions, follow up care, and rx x1 reviewed with patient. Patient verbalized understanding. 

## 2015-12-12 NOTE — Discharge Instructions (Signed)
Follow-up with your doctor next week for recheck 

## 2015-12-12 NOTE — ED Triage Notes (Signed)
Per pt, states left neck pain radiating to left arm and down left side of body-states going on for 2 months-was seen by PCP and given pain meds-meds control pain but it comes back once the meds wear off-no diagnosis given-

## 2015-12-12 NOTE — ED Provider Notes (Signed)
[facility] DEPT Provider Note   CSN: [id number] Arrival date & time: 12/12/15  4034     History   Chief Complaint Chief Complaint  Patient presents with  . Arm Pain    HPI Nichole Munoz is a 70 y.o. female.  Patient presents with left shoulder pain,  neck pain and left lower leg pain. This has been a chronic problem for her.   The history is provided by the patient. No language interpreter was used.  Arm Pain  This is a recurrent problem. The current episode started 12 to 24 hours ago. The problem occurs constantly. The problem has not changed since onset.Pertinent negatives include no chest pain, no abdominal pain and no headaches. Nothing aggravates the symptoms. Nothing relieves the symptoms. She has tried nothing for the symptoms.    Past Medical History:  Diagnosis Date  . Asthma   . Diabetes mellitus   . Diabetic neuropathy ([place])   . Hypertension   . Obesity   . Seasonal allergies     Patient Active Problem List   Diagnosis Date Noted  . Pain in the chest   . Chest pain 06/12/2015  . Asthma 06/12/2015  . Hypomagnesemia 09/24/2014  . Acute diverticulitis 09/23/2014  . CKD (chronic kidney disease) stage 3, GFR 30-59 ml/min 09/23/2014  . Leukocytosis 09/23/2014  . Sepsis ([place]) 09/23/2014  . Benign essential HTN 09/23/2014  . DM (diabetes mellitus), type 2, uncontrolled, with renal complications ([place]) 74/25/9563  . Diarrhea 09/23/2014  . Diabetic neuropathy ([place]) 04/12/2013    History reviewed. No pertinent surgical history.  OB History    No data available       Home Medications    Prior to Admission medications   Medication Sig Start Date End Date Taking? Authorizing Provider  albuterol (PROVENTIL HFA;VENTOLIN HFA) 108 (90 Base) MCG/ACT inhaler Inhale 2 puffs into the lungs every 6 (six) hours as needed for wheezing or shortness of breath. 06/14/15  Yes Theodis Blaze, MD  beclomethasone (QVAR) 80 MCG/ACT inhaler Inhale 2-4 puffs into the lungs every  4 (four) hours as needed (SOB). 06/14/15  Yes Theodis Blaze, MD  cetirizine (ZYRTEC) 10 MG tablet Take 0.5 tablets (5 mg total) by mouth daily. Patient taking differently: Take 10 mg by mouth daily.  10/27/11  Yes Shari Upstill, PA-C  gabapentin (NEURONTIN) 300 MG capsule Take 1 capsule (300 mg total) by mouth 3 (three) times daily. Patient taking differently: Take 300 mg by mouth 2 (two) times daily.  01/22/12  Yes Virgel Manifold, MD  glipiZIDE (GLUCOTROL XL) 10 MG 24 hr tablet Take 1 tablet (10 mg total) by mouth every morning. 08/10/15  Yes Melony Overly, MD  Insulin Glargine (LANTUS SOLOSTAR) 100 UNIT/ML Solostar Pen Inject 20 Units into the skin every morning. Patient taking differently: Inject 20 Units into the skin at bedtime.  08/10/15  Yes Melony Overly, MD  lisinopril (PRINIVIL,ZESTRIL) 5 MG tablet Take 1 tablet (5 mg total) by mouth at bedtime. 08/10/15  Yes Melony Overly, MD  metFORMIN (GLUCOPHAGE) 1000 MG tablet Take 1 tablet (1,000 mg total) by mouth 2 (two) times daily. 08/10/15  Yes Melony Overly, MD  amLODipine (NORVASC) 5 MG tablet Take 1 tablet (5 mg total) by mouth daily. 08/10/15   Melony Overly, MD  HYDROcodone-acetaminophen (NORCO/VICODIN) 5-325 MG tablet Take 1-2 tablets by mouth every 6 (six) hours as needed. Patient not taking: Reported on 09/13/2015 06/18/15   Montine Circle, PA-C  meloxicam [facility]) 7.5  MG tablet Take 2 tablets (15 mg total) by mouth daily. Patient not taking: Reported on 12/12/2015 09/13/15   Larene Pickett, PA-C  ondansetron [facility] ODT) 8 MG disintegrating tablet 8mg  ODT q8 hours prn nausea Patient not taking: Reported on 12/12/2015 02/05/15   April Palumbo, MD  oxyCODONE-acetaminophen (PERCOCET/ROXICET) 5-325 MG tablet Take 1 tablet by mouth every 4 (four) hours as needed. Patient not taking: Reported on 12/12/2015 09/13/15   Larene Pickett, PA-C  traMADol (ULTRAM) 50 MG tablet Take 1 tablet (50 mg total) by mouth every 6 (six) hours as needed. 12/12/15   Milton Ferguson, MD     Family History No family history on file.  Social History Social History  Substance Use Topics  . Smoking status: Former Research scientist (life sciences)  . Smokeless tobacco: Never Used  . Alcohol use No     Allergies   Other   Review of Systems Review of Systems  Constitutional: Negative for appetite change and fatigue.  HENT: Negative for congestion, ear discharge and sinus pressure.   Eyes: Negative for discharge.  Respiratory: Negative for cough.   Cardiovascular: Negative for chest pain.  Gastrointestinal: Negative for abdominal pain and diarrhea.  Genitourinary: Negative for frequency and hematuria.  Musculoskeletal: Negative for back pain.       Pain in left shoulder left arm left leg posterior neck  Skin: Negative for rash.  Neurological: Negative for seizures and headaches.  Psychiatric/Behavioral: Negative for hallucinations.     Physical Exam Updated Vital Signs BP (!) 139/52   Pulse 85   Temp 98.9 F (37.2 C) (Oral)   Resp 16   SpO2 93%   Physical Exam  Constitutional: She is oriented to person, place, and time. She appears well-developed.  HENT:  Head: Normocephalic.  Eyes: Conjunctivae and EOM are normal. No scleral icterus.  Neck: Neck supple. No thyromegaly present.  Cardiovascular: Normal rate and regular rhythm.  Exam reveals no gallop and no friction rub.   No murmur heard. Pulmonary/Chest: No stridor. She has no wheezes. She has no rales. She exhibits no tenderness.  Abdominal: She exhibits no distension. There is no tenderness. There is no rebound.  Musculoskeletal: Normal range of motion. She exhibits no edema.  Tender posterior neck minor. Tenderness to left shoulder.  Lymphadenopathy:    She has no cervical adenopathy.  Neurological: She is oriented to person, place, and time. She exhibits normal muscle tone. Coordination normal.  Skin: No rash noted. No erythema.  Psychiatric: She has a normal mood and affect. Her behavior is normal.     ED  Treatments / Results  Labs (all labs ordered are listed, but only abnormal results are displayed) Labs Reviewed  CBC WITH DIFFERENTIAL/PLATELET - Abnormal; Notable for the following:       Result Value   WBC 12.6 (*)    HCT 35.9 (*)    Eosinophils Absolute 1.0 (*)    All other components within normal limits  BASIC METABOLIC PANEL - Abnormal; Notable for the following:    Glucose, Bld 164 (*)    BUN 35 (*)    Creatinine, Ser 1.20 (*)    GFR calc non Af Amer 44 (*)    GFR calc Af Amer 51 (*)    All other components within normal limits    EKG  EKG Interpretation None       Radiology Dg Cervical Spine Complete  Result Date: 12/12/2015 CLINICAL DATA:  Left neck pain radiating into the arm. EXAM: CERVICAL SPINE -  COMPLETE 4+ VIEW COMPARISON:  None. FINDINGS: There is no evidence of cervical spine fracture or prevertebral soft tissue swelling. Alignment is normal. No evidence of bone lesion or endplate erosion. Mild disc narrowing and ridging at C4-5. Left uncovertebral spur causes mild C4-5 foraminal stenosis. Cervical carotid atherosclerosis. IMPRESSION: C4-5 mild disc degeneration and left foraminal narrowing. Electronically Signed   By: Monte Fantasia M.D.   On: 12/12/2015 10:10   Dg Shoulder Left  Result Date: 12/12/2015 CLINICAL DATA:  Neck pain. EXAM: LEFT SHOULDER - 2+ VIEW COMPARISON:  10/19/2015 . FINDINGS: Degenerative changes acromioclavicular glenohumeral joints. No acute bony or joint abnormality identified. IMPRESSION: Degenerative changes left shoulder.  No acute abnormality . Electronically Signed   By: Marcello Moores  Register   On: 12/12/2015 10:10    Procedures Procedures (including critical care time)  Medications Ordered in ED Medications  ondansetron (ZOFRAN) injection 4 mg (4 mg Intravenous Given 12/12/15 1015)  HYDROmorphone (DILAUDID) injection 1 mg (1 mg Intravenous Given 12/12/15 1015)     Initial Impression / Assessment and Plan / ED Course  I have  reviewed the triage vital signs and the nursing notes.  Pertinent labs & imaging results that were available during my care of the patient were reviewed by me and considered in my medical decision making (see chart for details).  Clinical Course     X-rays shoulder and neck arthritis. Patient will be sent home with Ultram will follow-up with PCP  Final Clinical Impressions(s) / ED Diagnoses   Final diagnoses:  Arthritis    New Prescriptions New Prescriptions   TRAMADOL (ULTRAM) 50 MG TABLET    Take 1 tablet (50 mg total) by mouth every 6 (six) hours as needed.     Milton Ferguson, MD 12/12/15 [phone]

## 2016-02-04 ENCOUNTER — Emergency Department (HOSPITAL_COMMUNITY)
Admission: EM | Admit: 2016-02-04 | Discharge: 2016-02-04 | Disposition: A | Discharge status: Home / Self Care | Payer: Medicare Other | Attending: Emergency Medicine | Admitting: Emergency Medicine

## 2016-02-04 ENCOUNTER — Emergency Department (HOSPITAL_COMMUNITY): Payer: Medicare Other

## 2016-02-04 ENCOUNTER — Encounter (HOSPITAL_COMMUNITY): Payer: Self-pay | Admitting: Emergency Medicine

## 2016-02-04 DIAGNOSIS — N183 Chronic kidney disease, stage 3 (moderate): Secondary | ICD-10-CM | POA: Diagnosis not present

## 2016-02-04 DIAGNOSIS — R42 Dizziness and giddiness: Secondary | ICD-10-CM | POA: Insufficient documentation

## 2016-02-04 DIAGNOSIS — Z87891 Personal history of nicotine dependence: Secondary | ICD-10-CM | POA: Diagnosis not present

## 2016-02-04 DIAGNOSIS — J45909 Unspecified asthma, uncomplicated: Secondary | ICD-10-CM | POA: Diagnosis not present

## 2016-02-04 DIAGNOSIS — I129 Hypertensive chronic kidney disease with stage 1 through stage 4 chronic kidney disease, or unspecified chronic kidney disease: Secondary | ICD-10-CM | POA: Insufficient documentation

## 2016-02-04 DIAGNOSIS — E1140 Type 2 diabetes mellitus with diabetic neuropathy, unspecified: Secondary | ICD-10-CM | POA: Insufficient documentation

## 2016-02-04 DIAGNOSIS — R739 Hyperglycemia, unspecified: Secondary | ICD-10-CM

## 2016-02-04 DIAGNOSIS — E1122 Type 2 diabetes mellitus with diabetic chronic kidney disease: Secondary | ICD-10-CM | POA: Insufficient documentation

## 2016-02-04 DIAGNOSIS — Z794 Long term (current) use of insulin: Secondary | ICD-10-CM | POA: Insufficient documentation

## 2016-02-04 DIAGNOSIS — Z79899 Other long term (current) drug therapy: Secondary | ICD-10-CM | POA: Diagnosis not present

## 2016-02-04 DIAGNOSIS — E1165 Type 2 diabetes mellitus with hyperglycemia: Secondary | ICD-10-CM | POA: Insufficient documentation

## 2016-02-04 LAB — BASIC METABOLIC PANEL
Anion gap: 11 (ref 5–15)
BUN: 28 mg/dL — AB (ref 6–20)
CHLORIDE: 92 mmol/L — AB (ref 101–111)
CO2: 29 mmol/L (ref 22–32)
CREATININE: 1.37 mg/dL — AB (ref 0.44–1.00)
Calcium: 9.4 mg/dL (ref 8.9–10.3)
GFR calc non Af Amer: 38 mL/min — ABNORMAL LOW (ref >60)
GFR, EST AFRICAN AMERICAN: 44 mL/min — AB (ref >60)
Glucose, Bld: 510 mg/dL (ref 65–99)
Potassium: 4.5 mmol/L (ref 3.5–5.1)
SODIUM: 132 mmol/L — AB (ref 135–145)

## 2016-02-04 LAB — URINALYSIS, ROUTINE W REFLEX MICROSCOPIC
BACTERIA UA: NONE SEEN
Bilirubin Urine: NEGATIVE
Glucose, UA: >=500 mg/dL — AB
Hgb urine dipstick: NEGATIVE
KETONES UR: NEGATIVE mg/dL
LEUKOCYTES UA: NEGATIVE
Nitrite: NEGATIVE
Protein, ur: NEGATIVE mg/dL
Specific Gravity, Urine: 1.021 (ref 1.005–1.030)
pH: 7 (ref 5.0–8.0)

## 2016-02-04 LAB — CBG MONITORING, ED
GLUCOSE-CAPILLARY: 459 mg/dL — AB (ref 65–99)
GLUCOSE-CAPILLARY: 410 mg/dL — AB (ref 65–99)
GLUCOSE-CAPILLARY: 211 mg/dL — AB (ref 65–99)
GLUCOSE-CAPILLARY: 305 mg/dL — AB (ref 65–99)
GLUCOSE-CAPILLARY: 134 mg/dL — AB (ref 65–99)
Glucose-Capillary: 553 mg/dL (ref 65–99)

## 2016-02-04 LAB — CBC WITH DIFFERENTIAL/PLATELET
BASOS PCT: 1 %
Basophils Absolute: 0.1 10*3/uL (ref 0.0–0.1)
EOS ABS: 0.1 10*3/uL (ref 0.0–0.7)
Eosinophils Relative: 2 %
HCT: 35.7 % — ABNORMAL LOW (ref 36.0–46.0)
HEMOGLOBIN: 11.7 g/dL — AB (ref 12.0–15.0)
LYMPHS ABS: 1 10*3/uL (ref 0.7–4.0)
Lymphocytes Relative: 17 %
MCH: 29 pg (ref 26.0–34.0)
MCHC: 32.8 g/dL (ref 30.0–36.0)
MCV: 88.4 fL (ref 78.0–100.0)
MONO ABS: 0.3 10*3/uL (ref 0.1–1.0)
MONOS PCT: 5 %
NEUTROS PCT: 75 %
Neutro Abs: 4.5 10*3/uL (ref 1.7–7.7)
Platelets: 122 10*3/uL — ABNORMAL LOW (ref 150–400)
RBC: 4.04 MIL/uL (ref 3.87–5.11)
RDW: 13.9 % (ref 11.5–15.5)
WBC: 6 10*3/uL (ref 4.0–10.5)

## 2016-02-04 LAB — I-STAT TROPONIN, ED: TROPONIN I, POC: 0 ng/mL (ref 0.00–0.08)

## 2016-02-04 MED ORDER — SODIUM CHLORIDE 0.9 % IV SOLN: INTRAVENOUS | Status: DC

## 2016-02-04 MED ORDER — SODIUM CHLORIDE 0.9 % IV SOLN
999.0000 mL/h | INTRAVENOUS | Status: AC
Start: 2016-02-04 — End: 2016-02-04
  Administered 2016-02-04: 999 mL/h via INTRAVENOUS

## 2016-02-04 MED ORDER — SODIUM CHLORIDE 0.9 % IV BOLUS (SEPSIS)
1000.0000 mL | Freq: Once | INTRAVENOUS | Status: DC
Start: 2016-02-04 — End: 2016-02-04
  Administered 2016-02-04: 1000 mL via INTRAVENOUS

## 2016-02-04 MED ORDER — SODIUM CHLORIDE 0.9 % IV BOLUS (SEPSIS)
1000.0000 mL | Freq: Once | INTRAVENOUS | Status: AC
  Administered 2016-02-04: 1000 mL via INTRAVENOUS

## 2016-02-04 MED ORDER — SODIUM CHLORIDE 0.9 % IV SOLN
INTRAVENOUS | Status: DC
  Administered 2016-02-04: 4 [IU]/h via INTRAVENOUS
  Filled 2016-02-04: qty 2.5

## 2016-02-04 NOTE — ED Notes (Signed)
Primary RN made aware of patient's glucose.

## 2016-02-04 NOTE — Discharge Instructions (Signed)
You must purchase supplies for your diabetes and check your sugars carefully. Recommend 22 units of insulin in the morning and evening.

## 2016-02-04 NOTE — ED Triage Notes (Signed)
Pt's family has had flu like symptoms x 1 week. Pt has had abdominal pain, dizziness, weakness, and feeling of impending doom that began today. Pt has hx of DM and read "high" on the meter for PTAR which means over . Ptar did not get a cardiac rhythm for pt. Pt has complaints of dizziness and increased urination. Pt told EMS this had been going on for 2 days. Pt told EDP this has been going on for 2 weeks. Pt is not monitoring her blood sugar at home.

## 2016-02-04 NOTE — ED Notes (Signed)
Per Dr.Cook stop Insulin Drip at this time due to pt blood glucose being 134.

## 2016-02-04 NOTE — ED Notes (Signed)
Pt family reports understanding of discharge information. No questions at time of discharge

## 2016-02-04 NOTE — ED Provider Notes (Signed)
[facility] DEPT Provider Note   CSN: [id number] Arrival date & time: 02/04/16  1510     History   Chief Complaint Chief Complaint  Patient presents with  . Hyperglycemia   History is obtained using a translator   HPI Nichole Munoz is a 71 y.o. female.  HPI Patient presents to the emergency room for evaluation of dizziness and weakness. He shouldn't had flu symptoms last week. Patient felt weak and dizzy early this morning. Went to stand up and felt like she was going to pass out. Patient fell back onto the bed. Patient has aches and pains all over but during my exam she denied any trouble with abdominal pain or chest pain. She is having increasing urinary frequency. She has a history of diabetes but has not been able to measure her blood sugar. She last took her insulin last evening. Past Medical History:  Diagnosis Date  . Asthma   . Diabetes mellitus   . Diabetic neuropathy ([place])   . Hypertension   . Obesity   . Seasonal allergies     Patient Active Problem List   Diagnosis Date Noted  . Pain in the chest   . Chest pain 06/12/2015  . Asthma 06/12/2015  . Hypomagnesemia 09/24/2014  . Acute diverticulitis 09/23/2014  . CKD (chronic kidney disease) stage 3, GFR 30-59 ml/min 09/23/2014  . Leukocytosis 09/23/2014  . Sepsis ([place]) 09/23/2014  . Benign essential HTN 09/23/2014  . DM (diabetes mellitus), type 2, uncontrolled, with renal complications ([place]) 62/83/1517  . Diarrhea 09/23/2014  . Diabetic neuropathy ([place]) 04/12/2013    History reviewed. No pertinent surgical history.  OB History    No data available       Home Medications    Prior to Admission medications   Medication Sig Start Date End Date Taking? Authorizing Provider  albuterol (PROVENTIL HFA;VENTOLIN HFA) 108 (90 Base) MCG/ACT inhaler Inhale 2 puffs into the lungs every 6 (six) hours as needed for wheezing or shortness of breath. 06/14/15  Yes Theodis Blaze, MD  amLODipine (NORVASC) 5 MG tablet Take  1 tablet (5 mg total) by mouth daily. 08/10/15  Yes Melony Overly, MD  beclomethasone (QVAR) 80 MCG/ACT inhaler Inhale 2-4 puffs into the lungs every 4 (four) hours as needed (SOB). 06/14/15  Yes Theodis Blaze, MD  cetirizine (ZYRTEC) 10 MG tablet Take 0.5 tablets (5 mg total) by mouth daily. Patient taking differently: Take 10 mg by mouth at bedtime.  10/27/11  Yes Shari Upstill, PA-C  gabapentin (NEURONTIN) 300 MG capsule Take 1 capsule (300 mg total) by mouth 3 (three) times daily. Patient taking differently: Take 300 mg by mouth 2 (two) times daily.  01/22/12  Yes Virgel Manifold, MD  glipiZIDE (GLUCOTROL XL) 10 MG 24 hr tablet Take 1 tablet (10 mg total) by mouth every morning. 08/10/15  Yes Melony Overly, MD  ibuprofen (ADVIL,MOTRIN) 200 MG tablet Take 400 mg by mouth every 4 (four) hours as needed for fever, headache, mild pain, moderate pain or cramping.   Yes Historical Provider, MD  Insulin Glargine (LANTUS SOLOSTAR) 100 UNIT/ML Solostar Pen Inject 20 Units into the skin every morning. Patient taking differently: Inject 20 Units into the skin 2 (two) times daily.  08/10/15  Yes Melony Overly, MD  lisinopril (PRINIVIL,ZESTRIL) 10 MG tablet Take 10 mg by mouth at bedtime. 01/21/16  Yes Historical Provider, MD  metFORMIN (GLUCOPHAGE) 850 MG tablet Take 850 mg by mouth 2 (two) times daily with a meal.  01/21/16  Yes Historical Provider, MD  Vitamin D, Ergocalciferol, (DRISDOL) 50000 units CAPS capsule Take 50,000 Units by mouth once a week. 01/05/16  Yes Historical Provider, MD  ondansetron (ZOFRAN ODT) 8 MG disintegrating tablet 8mg  ODT q8 hours prn nausea Patient not taking: Reported on 12/12/2015 02/05/15   April Palumbo, MD  traMADol (ULTRAM) 50 MG tablet Take 1 tablet (50 mg total) by mouth every 6 (six) hours as needed. Patient not taking: Reported on 02/04/2016 12/12/15   Milton Ferguson, MD    Family History No family history on file.  Social History Social History  Substance Use Topics  . Smoking  status: Former Research scientist (life sciences)  . Smokeless tobacco: Never Used  . Alcohol use No     Allergies   Other   Review of Systems Review of Systems  All other systems reviewed and are negative.    Physical Exam Updated Vital Signs BP 98/71   Pulse 95   Temp 99.3 F (37.4 C) (Oral)   Resp 23   Ht 5\' 7"  (1.702 m)   Wt 63.5 kg   SpO2 95%   BMI 21.93 kg/m   Physical Exam  Constitutional: No distress.  HENT:  Head: Normocephalic and atraumatic.  Right Ear: External ear normal.  Left Ear: External ear normal.  Eyes: Conjunctivae are normal. Right eye exhibits no discharge. Left eye exhibits no discharge. No scleral icterus.  Neck: Neck supple. No tracheal deviation present.  Cardiovascular: Normal rate, regular rhythm and intact distal pulses.   Pulmonary/Chest: Effort normal and breath sounds normal. No stridor. No respiratory distress. She has no wheezes. She has no rales.  Abdominal: Soft. Bowel sounds are normal. She exhibits no distension and no mass. There is no tenderness. There is no rebound and no guarding. No hernia.  Musculoskeletal: She exhibits no edema or tenderness.  Neurological: She is alert. She has normal strength. No cranial nerve deficit (no facial droop, extraocular movements intact, no slurred speech) or sensory deficit. She exhibits normal muscle tone. She displays no seizure activity. Coordination normal.  Equal grip strength bilaterally, equal plantar strength bilaterally, no face will droop  Skin: Skin is warm and dry. No rash noted. She is not diaphoretic.  Psychiatric: She has a normal mood and affect.  Nursing note and vitals reviewed.    ED Treatments / Results  Labs (all labs ordered are listed, but only abnormal results are displayed) Labs Reviewed  CBC WITH DIFFERENTIAL/PLATELET - Abnormal; Notable for the following:       Result Value   Hemoglobin 11.7 (*)    HCT 35.7 (*)    Platelets 122 (*)    All other components within normal limits  BASIC  METABOLIC PANEL - Abnormal; Notable for the following:    Sodium 132 (*)    Chloride 92 (*)    Glucose, Bld 510 (*)    BUN 28 (*)    Creatinine, Ser 1.37 (*)    GFR calc non Af Amer 38 (*)    GFR calc Af Amer 44 (*)    All other components within normal limits  URINALYSIS, ROUTINE W REFLEX MICROSCOPIC - Abnormal; Notable for the following:    Color, Urine STRAW (*)    Glucose, UA >=500 (*)    Squamous Epithelial / LPF 0-5 (*)    All other components within normal limits  CBG MONITORING, ED - Abnormal; Notable for the following:    Glucose-Capillary 553 (*)    All other components within normal limits  CBG MONITORING, ED - Abnormal; Notable for the following:    Glucose-Capillary 459 (*)    All other components within normal limits  I-STAT TROPOININ, ED    EKG  EKG Interpretation  Date/Time:  Monday February 04 2016 15:27:16 EST Ventricular Rate:  99 PR Interval:    QRS Duration: 92 QT Interval:  345 QTC Calculation: 443 R Axis:   29 Text Interpretation:  Sinus rhythm Since last tracing rate faster Confirmed by Oasis Goehring  MD-J, Bradon Fester ([id number]) on 02/04/2016 3:32:45 PM       Radiology Ct Head Wo Contrast  Result Date: 02/04/2016 CLINICAL DATA:  Flu symptoms for 1 week. Now with dizziness weakness and a sense of impending doom. EXAM: CT HEAD WITHOUT CONTRAST TECHNIQUE: Contiguous axial images were obtained from the base of the skull through the vertex without intravenous contrast. COMPARISON:  Head CT scan 01/17/2014. FINDINGS: Brain: Mild atrophy and chronic microvascular ischemic change are seen. Cavum septum pellucidum is noted. No evidence of acute abnormality including hemorrhage, infarct, mass lesion, mass effect, midline shift or abnormal extra-axial fluid collection is identified. No hydrocephalus or pneumocephalus. Vascular:  Atherosclerosis is noted. Skull: Intact. Sinuses/Orbits: Negative. Other: None. IMPRESSION: No acute abnormality. Mild atrophy and chronic microvascular  change. Atherosclerosis. Electronically Signed   By: Inge Rise M.D.   On: 02/04/2016 16:11    Procedures Procedures (including critical care time)  Medications Ordered in ED Medications  0.9 %  sodium chloride infusion (999 mL/hr Intravenous New Bag/Given 02/04/16 1711)  insulin regular (NOVOLIN R,HUMULIN R) 250 Units in sodium chloride 0.9 % 250 mL (1 Units/mL) infusion (4 Units/hr Intravenous New Bag/Given 02/04/16 1708)     Initial Impression / Assessment and Plan / ED Course  I have reviewed the triage vital signs and the nursing notes.  Pertinent labs & imaging results that were available during my care of the patient were reviewed by me and considered in my medical decision making (see chart for details).   patient's laboratory tests are reassuring. She has no focal neurologic deficits on exam. CT scan does not show any evidence of stroke or hemorrhage. I doubt TIA or stroke. I suspect her dizziness and lightheadedness may be related to dehydration and hyperglycemia. The patient has not been checking her blood sugars. We'll start her on insulin to see if we can correct her hyperglycemia to make her feel better.  She does not have DKA. I doubt she will require hospitalization.  Final Clinical Impressions(s) / ED Diagnoses   Final diagnoses:  Hyperglycemia    New Prescriptions New Prescriptions   No medications on file     Dorie Rank, MD 02/04/16 1757

## 2016-02-04 NOTE — ED Notes (Signed)
Pt assisted to restroom without any difficulties. Pt still reports dizziness with ambulation. MD to be notified.

## 2016-02-04 NOTE — ED Notes (Signed)
Dr.Cook made aware of pt dizziness and no further orders given at this time. He advised pt will attempt to be discharged due to her decreasing blood glucose levels.

## 2016-04-25 ENCOUNTER — Encounter (HOSPITAL_COMMUNITY): Payer: Self-pay | Admitting: Emergency Medicine

## 2016-04-25 ENCOUNTER — Emergency Department (HOSPITAL_COMMUNITY): Payer: No Typology Code available for payment source

## 2016-04-25 ENCOUNTER — Inpatient Hospital Stay (HOSPITAL_COMMUNITY)
Admission: EM | Admit: 2016-04-25 | Discharge: 2016-04-27 | DRG: 641 | Disposition: A | Discharge status: Home Health | Payer: No Typology Code available for payment source | Attending: Internal Medicine | Admitting: Internal Medicine

## 2016-04-25 DIAGNOSIS — E872 Acidosis, unspecified: Secondary | ICD-10-CM | POA: Diagnosis present

## 2016-04-25 DIAGNOSIS — E1122 Type 2 diabetes mellitus with diabetic chronic kidney disease: Secondary | ICD-10-CM | POA: Diagnosis present

## 2016-04-25 DIAGNOSIS — R932 Abnormal findings on diagnostic imaging of liver and biliary tract: Secondary | ICD-10-CM | POA: Diagnosis present

## 2016-04-25 DIAGNOSIS — S62102A Fracture of unspecified carpal bone, left wrist, initial encounter for closed fracture: Secondary | ICD-10-CM | POA: Diagnosis present

## 2016-04-25 DIAGNOSIS — I129 Hypertensive chronic kidney disease with stage 1 through stage 4 chronic kidney disease, or unspecified chronic kidney disease: Secondary | ICD-10-CM | POA: Diagnosis present

## 2016-04-25 DIAGNOSIS — N179 Acute kidney failure, unspecified: Secondary | ICD-10-CM | POA: Diagnosis present

## 2016-04-25 DIAGNOSIS — H578 Other specified disorders of eye and adnexa: Secondary | ICD-10-CM | POA: Diagnosis present

## 2016-04-25 DIAGNOSIS — S12600A Unspecified displaced fracture of seventh cervical vertebra, initial encounter for closed fracture: Secondary | ICD-10-CM | POA: Diagnosis present

## 2016-04-25 DIAGNOSIS — S52602A Unspecified fracture of lower end of left ulna, initial encounter for closed fracture: Secondary | ICD-10-CM | POA: Diagnosis present

## 2016-04-25 DIAGNOSIS — E875 Hyperkalemia: Secondary | ICD-10-CM | POA: Diagnosis not present

## 2016-04-25 DIAGNOSIS — E1165 Type 2 diabetes mellitus with hyperglycemia: Secondary | ICD-10-CM | POA: Diagnosis present

## 2016-04-25 DIAGNOSIS — N200 Calculus of kidney: Secondary | ICD-10-CM | POA: Diagnosis present

## 2016-04-25 DIAGNOSIS — Y92410 Unspecified street and highway as the place of occurrence of the external cause: Secondary | ICD-10-CM

## 2016-04-25 DIAGNOSIS — I950 Idiopathic hypotension: Secondary | ICD-10-CM | POA: Diagnosis present

## 2016-04-25 DIAGNOSIS — N189 Chronic kidney disease, unspecified: Secondary | ICD-10-CM | POA: Diagnosis present

## 2016-04-25 DIAGNOSIS — S12601A Unspecified nondisplaced fracture of seventh cervical vertebra, initial encounter for closed fracture: Secondary | ICD-10-CM | POA: Diagnosis present

## 2016-04-25 DIAGNOSIS — S0003XA Contusion of scalp, initial encounter: Secondary | ICD-10-CM

## 2016-04-25 DIAGNOSIS — T1490XA Injury, unspecified, initial encounter: Secondary | ICD-10-CM

## 2016-04-25 DIAGNOSIS — N390 Urinary tract infection, site not specified: Secondary | ICD-10-CM | POA: Diagnosis present

## 2016-04-25 DIAGNOSIS — N1831 Chronic kidney disease, stage 3a: Secondary | ICD-10-CM | POA: Diagnosis present

## 2016-04-25 DIAGNOSIS — S52502A Unspecified fracture of the lower end of left radius, initial encounter for closed fracture: Secondary | ICD-10-CM | POA: Diagnosis present

## 2016-04-25 DIAGNOSIS — J45909 Unspecified asthma, uncomplicated: Secondary | ICD-10-CM | POA: Diagnosis present

## 2016-04-25 DIAGNOSIS — D6489 Other specified anemias: Secondary | ICD-10-CM | POA: Diagnosis present

## 2016-04-25 DIAGNOSIS — I959 Hypotension, unspecified: Secondary | ICD-10-CM | POA: Diagnosis present

## 2016-04-25 HISTORY — DX: Unspecified osteoarthritis, unspecified site: M19.90

## 2016-04-25 LAB — CBC WITH DIFFERENTIAL/PLATELET
BASOS ABS: 0.2 10*3/uL — AB (ref 0.0–0.1)
BASOS PCT: 1 %
EOS ABS: 1.5 10*3/uL — AB (ref 0.0–0.7)
Eosinophils Relative: 10 %
HCT: 36.9 % (ref 36.0–46.0)
HEMOGLOBIN: 12.4 g/dL (ref 12.0–15.0)
LYMPHS PCT: 45 %
Lymphs Abs: 6.9 10*3/uL — ABNORMAL HIGH (ref 0.7–4.0)
MCH: 28.9 pg (ref 26.0–34.0)
MCHC: 33.6 g/dL (ref 30.0–36.0)
MCV: 86 fL (ref 78.0–100.0)
Monocytes Absolute: 0.6 10*3/uL (ref 0.1–1.0)
Monocytes Relative: 4 %
NEUTROS PCT: 40 %
Neutro Abs: 6.1 10*3/uL (ref 1.7–7.7)
PLATELETS: 212 10*3/uL (ref 150–400)
RBC: 4.29 MIL/uL (ref 3.87–5.11)
RDW: 12.7 % (ref 11.5–15.5)
WBC: 15.3 10*3/uL — AB (ref 4.0–10.5)

## 2016-04-25 LAB — I-STAT CHEM 8, ED
BUN: 34 mg/dL — ABNORMAL HIGH (ref 6–20)
BUN: 31 mg/dL — AB (ref 6–20)
CALCIUM ION: 0.92 mmol/L — AB (ref 1.15–1.40)
CALCIUM ION: 1 mmol/L — AB (ref 1.15–1.40)
CHLORIDE: 107 mmol/L (ref 101–111)
CHLORIDE: 108 mmol/L (ref 101–111)
Creatinine, Ser: 1.7 mg/dL — ABNORMAL HIGH (ref 0.44–1.00)
Creatinine, Ser: 1.6 mg/dL — ABNORMAL HIGH (ref 0.44–1.00)
GLUCOSE: 302 mg/dL — AB (ref 65–99)
GLUCOSE: 264 mg/dL — AB (ref 65–99)
HCT: 28 % — ABNORMAL LOW (ref 36.0–46.0)
HCT: 30 % — ABNORMAL LOW (ref 36.0–46.0)
Hemoglobin: 9.5 g/dL — ABNORMAL LOW (ref 12.0–15.0)
Hemoglobin: 10.2 g/dL — ABNORMAL LOW (ref 12.0–15.0)
POTASSIUM: 6 mmol/L — AB (ref 3.5–5.1)
Potassium: 5.7 mmol/L — ABNORMAL HIGH (ref 3.5–5.1)
SODIUM: 136 mmol/L (ref 135–145)
Sodium: 137 mmol/L (ref 135–145)
TCO2: 20 mmol/L (ref 0–100)
TCO2: 21 mmol/L (ref 0–100)

## 2016-04-25 LAB — BASIC METABOLIC PANEL
ANION GAP: 15 (ref 5–15)
ANION GAP: 9 (ref 5–15)
BUN: 32 mg/dL — ABNORMAL HIGH (ref 6–20)
BUN: 29 mg/dL — AB (ref 6–20)
CALCIUM: 9 mg/dL (ref 8.9–10.3)
CO2: 19 mmol/L — AB (ref 22–32)
CO2: 20 mmol/L — AB (ref 22–32)
Calcium: 7.6 mg/dL — ABNORMAL LOW (ref 8.9–10.3)
Chloride: 101 mmol/L (ref 101–111)
Chloride: 106 mmol/L (ref 101–111)
Creatinine, Ser: 1.84 mg/dL — ABNORMAL HIGH (ref 0.44–1.00)
Creatinine, Ser: 1.68 mg/dL — ABNORMAL HIGH (ref 0.44–1.00)
GFR calc Af Amer: 34 mL/min — ABNORMAL LOW (ref >60)
GFR calc non Af Amer: 26 mL/min — ABNORMAL LOW (ref >60)
GFR calc non Af Amer: 30 mL/min — ABNORMAL LOW (ref >60)
GFR, EST AFRICAN AMERICAN: 31 mL/min — AB (ref >60)
GLUCOSE: 257 mg/dL — AB (ref 65–99)
Glucose, Bld: 363 mg/dL — ABNORMAL HIGH (ref 65–99)
POTASSIUM: 5.8 mmol/L — AB (ref 3.5–5.1)
Potassium: 4.8 mmol/L (ref 3.5–5.1)
SODIUM: 135 mmol/L (ref 135–145)
Sodium: 135 mmol/L (ref 135–145)

## 2016-04-25 LAB — I-STAT TROPONIN, ED: TROPONIN I, POC: 0.04 ng/mL (ref 0.00–0.08)

## 2016-04-25 LAB — URINALYSIS, ROUTINE W REFLEX MICROSCOPIC
BILIRUBIN URINE: NEGATIVE
Glucose, UA: >=500 mg/dL — AB
KETONES UR: NEGATIVE mg/dL
Nitrite: NEGATIVE
PROTEIN: NEGATIVE mg/dL
SQUAMOUS EPITHELIAL / LPF: NONE SEEN
Specific Gravity, Urine: 1.009 (ref 1.005–1.030)
pH: 5 (ref 5.0–8.0)

## 2016-04-25 LAB — I-STAT CG4 LACTIC ACID, ED
LACTIC ACID, VENOUS: 3.65 mmol/L — AB (ref 0.5–1.9)
Lactic Acid, Venous: 3.76 mmol/L (ref 0.5–1.9)
Lactic Acid, Venous: 3.23 mmol/L (ref 0.5–1.9)

## 2016-04-25 LAB — GLUCOSE, CAPILLARY: Glucose-Capillary: 170 mg/dL — ABNORMAL HIGH (ref 65–99)

## 2016-04-25 LAB — ETHANOL

## 2016-04-25 LAB — CBG MONITORING, ED
GLUCOSE-CAPILLARY: 352 mg/dL — AB (ref 65–99)
GLUCOSE-CAPILLARY: 214 mg/dL — AB (ref 65–99)

## 2016-04-25 LAB — CK: Total CK: 612 U/L — ABNORMAL HIGH (ref 38–234)

## 2016-04-25 LAB — MAGNESIUM: Magnesium: 1 mg/dL — ABNORMAL LOW (ref 1.7–2.4)

## 2016-04-25 MED ORDER — LORATADINE 10 MG PO TABS
10.0000 mg | ORAL_TABLET | Freq: Every day | ORAL | Status: DC
  Administered 2016-04-25 – 2016-04-27 (×3): 10 mg via ORAL
  Filled 2016-04-25 (×3): qty 1

## 2016-04-25 MED ORDER — HYDROCODONE-ACETAMINOPHEN 5-325 MG PO TABS
1.0000 | ORAL_TABLET | ORAL | Status: DC | PRN
  Administered 2016-04-26 – 2016-04-27 (×7): 2 via ORAL
  Filled 2016-04-25 (×7): qty 2

## 2016-04-25 MED ORDER — DEXTROSE 5 % IV SOLN
1.0000 g | Freq: Once | INTRAVENOUS | Status: AC
  Administered 2016-04-25: 1 g via INTRAVENOUS
  Filled 2016-04-25: qty 10

## 2016-04-25 MED ORDER — IOPAMIDOL (ISOVUE-300) INJECTION 61%
INTRAVENOUS | Status: AC
  Filled 2016-04-25: qty 75

## 2016-04-25 MED ORDER — SODIUM CHLORIDE 0.9 % IV BOLUS (SEPSIS)
1000.0000 mL | Freq: Once | INTRAVENOUS | Status: AC
  Administered 2016-04-25: 1000 mL via INTRAVENOUS

## 2016-04-25 MED ORDER — ONDANSETRON HCL 4 MG PO TABS: 4.0000 mg | ORAL_TABLET | Freq: Four times a day (QID) | ORAL | Status: DC | PRN

## 2016-04-25 MED ORDER — INSULIN ASPART 100 UNIT/ML ~~LOC~~ SOLN
0.0000 [IU] | Freq: Three times a day (TID) | SUBCUTANEOUS | Status: DC
  Administered 2016-04-26 (×2): 3 [IU] via SUBCUTANEOUS
  Administered 2016-04-26: 5 [IU] via SUBCUTANEOUS
  Administered 2016-04-27 (×3): 2 [IU] via SUBCUTANEOUS

## 2016-04-25 MED ORDER — INSULIN ASPART 100 UNIT/ML ~~LOC~~ SOLN
5.0000 [IU] | SUBCUTANEOUS | Status: AC
  Administered 2016-04-25: 5 [IU] via SUBCUTANEOUS
  Filled 2016-04-25: qty 1

## 2016-04-25 MED ORDER — INSULIN ASPART 100 UNIT/ML ~~LOC~~ SOLN: 0.0000 [IU] | Freq: Every day | SUBCUTANEOUS | Status: DC

## 2016-04-25 MED ORDER — ACETAMINOPHEN 325 MG PO TABS
650.0000 mg | ORAL_TABLET | Freq: Four times a day (QID) | ORAL | Status: DC | PRN
  Administered 2016-04-27: 650 mg via ORAL
  Filled 2016-04-25: qty 2

## 2016-04-25 MED ORDER — MORPHINE SULFATE (PF) 4 MG/ML IV SOLN
2.0000 mg | INTRAVENOUS | Status: DC | PRN
  Administered 2016-04-25 – 2016-04-27 (×6): 2 mg via INTRAVENOUS
  Filled 2016-04-25 (×6): qty 1

## 2016-04-25 MED ORDER — ASPIRIN EC 81 MG PO TBEC
81.0000 mg | DELAYED_RELEASE_TABLET | Freq: Every day | ORAL | Status: DC
  Administered 2016-04-25 – 2016-04-27 (×3): 81 mg via ORAL
  Filled 2016-04-25 (×3): qty 1

## 2016-04-25 MED ORDER — ACETAMINOPHEN 650 MG RE SUPP: 650.0000 mg | Freq: Four times a day (QID) | RECTAL | Status: DC | PRN

## 2016-04-25 MED ORDER — ONDANSETRON HCL 4 MG/2ML IJ SOLN
4.0000 mg | Freq: Once | INTRAMUSCULAR | Status: AC
  Administered 2016-04-25: 4 mg via INTRAVENOUS
  Filled 2016-04-25: qty 2

## 2016-04-25 MED ORDER — MORPHINE SULFATE (PF) 4 MG/ML IV SOLN
4.0000 mg | Freq: Once | INTRAVENOUS | Status: AC
  Administered 2016-04-25: 4 mg via INTRAVENOUS
  Filled 2016-04-25: qty 1

## 2016-04-25 MED ORDER — CALCIUM GLUCONATE 10 % IV SOLN
1.0000 g | Freq: Once | INTRAVENOUS | Status: AC
  Administered 2016-04-25: 1 g via INTRAVENOUS
  Filled 2016-04-25: qty 10

## 2016-04-25 MED ORDER — MAGNESIUM SULFATE 2 GM/50ML IV SOLN
2.0000 g | Freq: Once | INTRAVENOUS | Status: AC
  Administered 2016-04-25: 2 g via INTRAVENOUS
  Filled 2016-04-25: qty 50

## 2016-04-25 MED ORDER — MORPHINE SULFATE (PF) 4 MG/ML IV SOLN: 3.0000 mg | INTRAVENOUS | Status: DC | PRN

## 2016-04-25 MED ORDER — IPRATROPIUM-ALBUTEROL 0.5-2.5 (3) MG/3ML IN SOLN: 3.0000 mL | RESPIRATORY_TRACT | Status: DC | PRN

## 2016-04-25 MED ORDER — SODIUM CHLORIDE 0.9 % IV SOLN
INTRAVENOUS | Status: DC
  Administered 2016-04-25 – 2016-04-27 (×4): via INTRAVENOUS

## 2016-04-25 MED ORDER — DEXTROSE 5 % IV SOLN: 1.0000 g | INTRAVENOUS | Status: DC

## 2016-04-25 MED ORDER — SODIUM CHLORIDE 0.9 % IV BOLUS (SEPSIS): 500.0000 mL | Freq: Once | INTRAVENOUS | Status: DC

## 2016-04-25 MED ORDER — ONDANSETRON HCL 4 MG/2ML IJ SOLN: 4.0000 mg | Freq: Four times a day (QID) | INTRAMUSCULAR | Status: DC | PRN

## 2016-04-25 MED ORDER — GABAPENTIN 300 MG PO CAPS
300.0000 mg | ORAL_CAPSULE | Freq: Two times a day (BID) | ORAL | Status: DC
  Administered 2016-04-25 – 2016-04-27 (×4): 300 mg via ORAL
  Filled 2016-04-25 (×4): qty 1

## 2016-04-25 NOTE — ED Notes (Signed)
MD Delo discussing patient over phone with Dr. Hulen Skains.

## 2016-04-25 NOTE — Progress Notes (Signed)
Orthopedic Tech Progress Note Patient Details:  Nichole Munoz 1945/05/12 [id number]  Ortho Devices Type of Ortho Device: Ace wrap, Arm sling, Sugartong splint Ortho Device/Splint Location: lue Ortho Device/Splint Interventions: Application   Thomasa Heidler 04/25/2016, 2:04 PM

## 2016-04-25 NOTE — H&P (Signed)
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 described. CT of the maxillofacial bones: No acute bony abnormality is noted. Facial swelling due to the known scalp hematoma and extension inferiorly is noted. CT of the cervical spine: Undisplaced fracture involving the left facet at C7. The fracture line extends through the vertebral foramen on the left this is best visualized on the orthogonal axial views. No other fractures are seen. Electronically Signed   By: Inez Catalina M.D.   On: 04/25/2016 12:01    Review of Systems  Constitutional: Negative.   Eyes: Positive for pain (periorbital hematoma).    Blood pressure 119/64, pulse (!) 104, temperature  97.5 F (36.4 C), temperature source Oral, resp. rate 19, height '5\' 6"'$  (1.676 m), weight 68 kg (150 lb), SpO2 99 %. Physical Exam  Vitals reviewed. Constitutional: She is oriented to person, place, and time. She appears well-developed and well-nourished.  HENT:  Head:    Cardiovascular: Normal rate, regular rhythm and normal heart sounds.   Respiratory: Effort normal and breath sounds normal.  Say chest feels tight.  No crepitance, mildly tender in sternum, no lateral tenderness  GI: Soft. Bowel  sounds are normal. There is no tenderness. There is no rebound and no guarding.  Negative FAST, Not distended.  Not tender  Musculoskeletal: She exhibits edema (left wrist) and tenderness (left wrist).       Left wrist: She exhibits decreased range of motion, tenderness, bony tenderness, swelling and deformity.       Arms: Neurological: She is alert and oriented to person, place, and time.     Assessment/Plan [facility] Left C-7 facet fracture, collar only per neurosurgery - Left wrist fracture dislocation - outpatient follow-up per Hand Surgery    Seen by Dr. Hulen Skains - recommended contrast CT scan.  No acute injuries in chest. Some metabolic/ medical issues - may be admitted by [place] will recheck in AM prior to discharge.    Tristine Langi K. 04/25/2016, 5:51 PM   Procedures

## 2016-04-25 NOTE — ED Notes (Signed)
Pt transported to CT by transport. Remains on monitor.
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
## 2016-04-25 NOTE — ED Notes (Signed)
CG-4 of 3.23 reported to Dr. Hulen Skains

## 2016-04-25 NOTE — ED Notes (Signed)
Ortho tech notified for need of splint.

## 2016-04-25 NOTE — ED Notes (Signed)
Pt BP suddenly reading hypotensive in the 60's. MD Delo made aware. Verified with manual BP. Bolus of NS infusing at this time.

## 2016-04-25 NOTE — ED Notes (Signed)
Pt transporting to xray for wrist films.
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
## 2016-04-25 NOTE — ED Notes (Signed)
Water provided

## 2016-04-25 NOTE — ED Notes (Signed)
Attempted report 

## 2016-04-26 DIAGNOSIS — R932 Abnormal findings on diagnostic imaging of liver and biliary tract: Secondary | ICD-10-CM | POA: Diagnosis present

## 2016-04-26 DIAGNOSIS — Y92410 Unspecified street and highway as the place of occurrence of the external cause: Secondary | ICD-10-CM | POA: Diagnosis not present

## 2016-04-26 DIAGNOSIS — S62102A Fracture of unspecified carpal bone, left wrist, initial encounter for closed fracture: Secondary | ICD-10-CM | POA: Diagnosis not present

## 2016-04-26 DIAGNOSIS — J45909 Unspecified asthma, uncomplicated: Secondary | ICD-10-CM | POA: Diagnosis present

## 2016-04-26 DIAGNOSIS — E1122 Type 2 diabetes mellitus with diabetic chronic kidney disease: Secondary | ICD-10-CM | POA: Diagnosis present

## 2016-04-26 DIAGNOSIS — N179 Acute kidney failure, unspecified: Secondary | ICD-10-CM | POA: Diagnosis present

## 2016-04-26 DIAGNOSIS — D6489 Other specified anemias: Secondary | ICD-10-CM | POA: Diagnosis present

## 2016-04-26 DIAGNOSIS — N390 Urinary tract infection, site not specified: Secondary | ICD-10-CM | POA: Diagnosis present

## 2016-04-26 DIAGNOSIS — S52602A Unspecified fracture of lower end of left ulna, initial encounter for closed fracture: Secondary | ICD-10-CM | POA: Diagnosis present

## 2016-04-26 DIAGNOSIS — S0003XA Contusion of scalp, initial encounter: Secondary | ICD-10-CM | POA: Diagnosis present

## 2016-04-26 DIAGNOSIS — H578 Other specified disorders of eye and adnexa: Secondary | ICD-10-CM | POA: Diagnosis present

## 2016-04-26 DIAGNOSIS — I129 Hypertensive chronic kidney disease with stage 1 through stage 4 chronic kidney disease, or unspecified chronic kidney disease: Secondary | ICD-10-CM | POA: Diagnosis present

## 2016-04-26 DIAGNOSIS — S12601A Unspecified nondisplaced fracture of seventh cervical vertebra, initial encounter for closed fracture: Secondary | ICD-10-CM | POA: Diagnosis present

## 2016-04-26 DIAGNOSIS — N189 Chronic kidney disease, unspecified: Secondary | ICD-10-CM

## 2016-04-26 DIAGNOSIS — E872 Acidosis, unspecified: Secondary | ICD-10-CM | POA: Diagnosis present

## 2016-04-26 DIAGNOSIS — E1165 Type 2 diabetes mellitus with hyperglycemia: Secondary | ICD-10-CM | POA: Diagnosis present

## 2016-04-26 DIAGNOSIS — N1831 Chronic kidney disease, stage 3a: Secondary | ICD-10-CM | POA: Diagnosis present

## 2016-04-26 DIAGNOSIS — S52502A Unspecified fracture of the lower end of left radius, initial encounter for closed fracture: Secondary | ICD-10-CM | POA: Diagnosis present

## 2016-04-26 DIAGNOSIS — I950 Idiopathic hypotension: Secondary | ICD-10-CM | POA: Diagnosis present

## 2016-04-26 DIAGNOSIS — S12600A Unspecified displaced fracture of seventh cervical vertebra, initial encounter for closed fracture: Secondary | ICD-10-CM | POA: Diagnosis present

## 2016-04-26 DIAGNOSIS — N200 Calculus of kidney: Secondary | ICD-10-CM | POA: Diagnosis present

## 2016-04-26 DIAGNOSIS — I959 Hypotension, unspecified: Secondary | ICD-10-CM | POA: Diagnosis present

## 2016-04-26 DIAGNOSIS — E875 Hyperkalemia: Secondary | ICD-10-CM | POA: Diagnosis not present

## 2016-04-26 HISTORY — DX: Unspecified displaced fracture of seventh cervical vertebra, initial encounter for closed fracture: S12.600A

## 2016-04-26 HISTORY — DX: Fracture of unspecified carpal bone, left wrist, initial encounter for closed fracture: S62.102A

## 2016-04-26 LAB — GLUCOSE, CAPILLARY
Glucose-Capillary: 230 mg/dL — ABNORMAL HIGH (ref 65–99)
Glucose-Capillary: 208 mg/dL — ABNORMAL HIGH (ref 65–99)
Glucose-Capillary: 263 mg/dL — ABNORMAL HIGH (ref 65–99)
Glucose-Capillary: 189 mg/dL — ABNORMAL HIGH (ref 65–99)

## 2016-04-26 LAB — COMPREHENSIVE METABOLIC PANEL
ALT: 16 U/L (ref 14–54)
ANION GAP: 8 (ref 5–15)
AST: 31 U/L (ref 15–41)
Albumin: 3.2 g/dL — ABNORMAL LOW (ref 3.5–5.0)
Alkaline Phosphatase: 51 U/L (ref 38–126)
BUN: 23 mg/dL — ABNORMAL HIGH (ref 6–20)
CHLORIDE: 108 mmol/L (ref 101–111)
CO2: 21 mmol/L — AB (ref 22–32)
CREATININE: 1.49 mg/dL — AB (ref 0.44–1.00)
Calcium: 7.8 mg/dL — ABNORMAL LOW (ref 8.9–10.3)
GFR, EST AFRICAN AMERICAN: 40 mL/min — AB (ref >60)
GFR, EST NON AFRICAN AMERICAN: 34 mL/min — AB (ref >60)
Glucose, Bld: 135 mg/dL — ABNORMAL HIGH (ref 65–99)
POTASSIUM: 4.5 mmol/L (ref 3.5–5.1)
SODIUM: 137 mmol/L (ref 135–145)
Total Bilirubin: 1.1 mg/dL (ref 0.3–1.2)
Total Protein: 6.2 g/dL — ABNORMAL LOW (ref 6.5–8.1)

## 2016-04-26 LAB — MAGNESIUM: Magnesium: 1.3 mg/dL — ABNORMAL LOW (ref 1.7–2.4)

## 2016-04-26 LAB — CBC
HCT: 27.2 % — ABNORMAL LOW (ref 36.0–46.0)
Hemoglobin: 9.2 g/dL — ABNORMAL LOW (ref 12.0–15.0)
MCH: 29.1 pg (ref 26.0–34.0)
MCHC: 33.8 g/dL (ref 30.0–36.0)
MCV: 86.1 fL (ref 78.0–100.0)
Platelets: 176 10*3/uL (ref 150–400)
RBC: 3.16 MIL/uL — ABNORMAL LOW (ref 3.87–5.11)
RDW: 12.6 % (ref 11.5–15.5)
WBC: 11.1 10*3/uL — ABNORMAL HIGH (ref 4.0–10.5)

## 2016-04-26 LAB — TROPONIN I: Troponin I: <0.03 ng/mL (ref <0.03)

## 2016-04-26 LAB — LACTIC ACID, PLASMA
Lactic Acid, Venous: 2.4 mmol/L (ref 0.5–1.9)
Lactic Acid, Venous: 1.5 mmol/L (ref 0.5–1.9)

## 2016-04-26 MED ORDER — HYDROCODONE-ACETAMINOPHEN 5-325 MG PO TABS: 1.0000 | ORAL_TABLET | ORAL | 0 refills | Status: DC | PRN

## 2016-04-26 MED ORDER — CEFPODOXIME PROXETIL 100 MG PO TABS: 100.0000 mg | ORAL_TABLET | Freq: Two times a day (BID) | ORAL | 0 refills | Status: AC

## 2016-04-26 MED ORDER — MAGNESIUM SULFATE IN D5W 1-5 GM/100ML-% IV SOLN
1.0000 g | Freq: Once | INTRAVENOUS | Status: AC
  Administered 2016-04-26: 1 g via INTRAVENOUS
  Filled 2016-04-26: qty 100

## 2016-04-26 MED ORDER — SENNA 8.6 MG PO TABS: 1.0000 | ORAL_TABLET | Freq: Every day | ORAL | 0 refills | Status: DC

## 2016-04-26 MED ORDER — CEFPODOXIME PROXETIL 200 MG PO TABS
100.0000 mg | ORAL_TABLET | Freq: Two times a day (BID) | ORAL | Status: DC
  Administered 2016-04-26 – 2016-04-27 (×3): 100 mg via ORAL
  Filled 2016-04-26 (×4): qty 1

## 2016-04-26 MED ORDER — INSULIN GLARGINE 100 UNIT/ML ~~LOC~~ SOLN
8.0000 [IU] | Freq: Every day | SUBCUTANEOUS | Status: DC
  Administered 2016-04-26: 8 [IU] via SUBCUTANEOUS
  Filled 2016-04-26 (×2): qty 0.08

## 2016-04-26 MED ORDER — POLYETHYLENE GLYCOL 3350 17 G PO PACK: 17.0000 g | PACK | Freq: Every day | ORAL | 0 refills | Status: DC | PRN

## 2016-04-26 NOTE — Progress Notes (Addendum)
Foam pad, protective dressing applied to coccyx, per patient report of tenderness to site. No erythema/skin intact noted upon assessment. Patient OOB to [facility] with moderate assist X 2. Patient returned to bed repositioned to right site with pillow support. PRN pain medication given for discomfort. Patient son remains at bedside.
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
## 2016-04-26 NOTE — Progress Notes (Signed)
Received patient from ED accompanied by some family members.  Patient noted with aspen collar and left arm sling. VS stable with tachy pulse rate and pain at 4/10 at left wrist. Patient now resting on bed.  Will monitor closely.

## 2016-04-26 NOTE — Discharge Instructions (Signed)
Colles Fracture Colles fracture is a type of broken wrist. It means that your radius bone is broken or cracked. The radius is the bone of your forearm on the same side as your thumb. The forearm is the part of your arm that is between your elbow and your wrist. Your forearm is made up of two bones. The other bone is called the ulna. Often, when the radius is broken, the ulna may also be broken. A cast or splint is used to protect and prevent your injured bone from moving as it heals. What are the causes? Common causes of this type of fracture include:  A hard, direct hit to the wrist.  Falling on an outstretched hand.  Trauma, such as a car accident or a fall. What increases the risk? You may be at higher risk for this type of fracture if:  You participate in contact sports and high-risk sports, such as skiing, biking, and ice skating.  You smoke.  You drink more than three alcoholic beverages per day.  You have low or lowered bone density (osteoporosis or osteopenia).  You are a young child or an older adult.  You are a woman who has gone through menopause.  You have a history of previous fractures.  You are not getting enough calcium or vitamin D. What are the signs or symptoms? Symptoms of Colles fracture may include tenderness, bruising, and inflammation. Additionally, your wrist may hang in an odd position, appear deformed, and be difficult to move. How is this diagnosed? Diagnosis may include:  Physical exam.  X-ray. How is this treated? Treatment depends on many factors, including the severity of the fracture, your age, and your activity level. Treatment for Colles fracture can be nonsurgical or surgical. Nonsurgical Treatment  A cast or a splint may be applied to your wrist if the bone is in a good position. If the fracture is not in a good position, it may be necessary for your health care provider to realign it before applying a cast or a splint. Usually, a cast  or a splint will be worn for several weeks. Surgical Treatment  Sometimes, if the fractured bone is severely displaced, surgery is required to help hold it together as it heals. Depending on the fracture, there are a number of options for holding the bone in place while it heals, such as a cast and metal pins. Follow these instructions at home: If you have a cast:   Do not stick anything inside the cast to scratch your skin. Doing that increases your risk of infection.  Check the skin around the cast every day. Report any concerns to your health care provider. You may put lotion on dry skin around the edges of the cast. Do not apply lotion to the skin underneath the cast. If you have a splint:   Wear it as directed by your health care provider. Remove it only as directed by your health care provider.  Loosen the splint if your fingers become numb and tingle, or if they turn cold and blue. Bathing   Cover the cast or splint with a watertight plastic bag to protect it from water while you bathe or shower. Do not let the cast or splint get wet. Managing pain, stiffness, and swelling   If directed, apply ice to the injured area:  Put ice in a plastic bag.  Place a towel between your skin and the bag.  Leave the ice on for 20 minutes, 2-3 times  a day.  Move your fingers often to avoid stiffness and to lessen swelling.  Raise the injured area above the level of your heart while you are sitting or lying down. Driving   Do not drive or operate heavy machinery while taking pain medicine.  Do not drive while wearing a cast or splint on a hand that you use for driving. Activity   Return to your normal activities as directed by your health care provider. Ask your health care provider what activities are safe for you.  Perform range-of-motion exercises only as directed by your health care provider. Safety   Do not use your injured limb to support your body weight until your health care  provider says that you can. General instructions   Do not put pressure on any part of the cast or splint until it is fully hardened. This may take several hours.  Keep the cast or splint clean and dry.  Do not use any tobacco products, including cigarettes, chewing tobacco, or electronic cigarettes. Tobacco can delay bone healing. If you need help quitting, ask your health care provider.  Take medicines only as directed by your health care provider.  Keep all follow-up visits as directed by your health care provider. This is important. Contact a health care provider if:  Your cast or splint becomes wet or damaged or suddenly feels too tight.  You have a fever.  You have chills.  You have continued severe pain or more swelling than you did before the cast or splint was put on your wrist. Get help right away if:  Your hand or fingernails on the injured arm turn blue or gray, or they feel cold or numb.  You have decreased feeling in the fingers of your injured arm. This information is not intended to replace advice given to you by your health care provider. Make sure you discuss any questions you have with your health care provider. Document Released: 01/08/2006 Document Revised: 05/31/2015 Document Reviewed: 08/08/2013 Elsevier Interactive Patient Education  2017 [employer]   Cervical Collar A cervical collar is a device that supports your chin and the back of your head. It is used after a severe neck injury to protect your head and neck. It does this by restricting the movement of the top part of your spine, which is located in your neck. A cervical collar may be used when you have:  A fractured neck.  Ligament damage.  A spinal cord injury. What instructions should I follow?  Wear the collar for as long as your health care provider instructs.  Follow your health care provider's instructions about how to put on and take off your collar.  Do not make your collar so  tight that you feel pain or it is hard for you to breathe.  Do not remove the collar unless your health care provider says it is okay. Ask your health care provider if you can remove the collar for showering or eating or to apply ice.  Do not drive a car until your health care provider says it is okay.  Keep all follow-up visits as directed by your health care provider. This is important. Any delay in getting necessary care can keep your injury from healing properly.  Apply ice to the injured area:  Put ice in a plastic bag.  Place a towel between your skin and the bag.  Leave the ice on for 20 minutes, 2-3 times per day for the first 2 days. This information  is not intended to replace advice given to you by your health care provider. Make sure you discuss any questions you have with your health care provider. Document Released: 09/15/2003 Document Revised: 05/03/2015 Document Reviewed: 08/01/2013 Elsevier Interactive Patient Education  2017 [employer].

## 2016-04-27 DIAGNOSIS — R338 Other retention of urine: Secondary | ICD-10-CM

## 2016-04-27 DIAGNOSIS — S62102A Fracture of unspecified carpal bone, left wrist, initial encounter for closed fracture: Secondary | ICD-10-CM

## 2016-04-27 HISTORY — DX: Other retention of urine: R33.8

## 2016-04-27 LAB — URINE CULTURE

## 2016-04-27 LAB — CBC
HEMATOCRIT: 26 % — AB (ref 36.0–46.0)
HEMOGLOBIN: 8.7 g/dL — AB (ref 12.0–15.0)
MCH: 29.6 pg (ref 26.0–34.0)
MCHC: 33.5 g/dL (ref 30.0–36.0)
MCV: 88.4 fL (ref 78.0–100.0)
Platelets: 154 10*3/uL (ref 150–400)
RBC: 2.94 MIL/uL — ABNORMAL LOW (ref 3.87–5.11)
RDW: 13.3 % (ref 11.5–15.5)
WBC: 12.3 10*3/uL — ABNORMAL HIGH (ref 4.0–10.5)

## 2016-04-27 LAB — BASIC METABOLIC PANEL
Anion gap: 9 (ref 5–15)
BUN: 17 mg/dL (ref 6–20)
CALCIUM: 8.2 mg/dL — AB (ref 8.9–10.3)
CO2: 22 mmol/L (ref 22–32)
CREATININE: 1.43 mg/dL — AB (ref 0.44–1.00)
Chloride: 106 mmol/L (ref 101–111)
GFR calc non Af Amer: 36 mL/min — ABNORMAL LOW (ref >60)
GFR, EST AFRICAN AMERICAN: 42 mL/min — AB (ref >60)
Glucose, Bld: 186 mg/dL — ABNORMAL HIGH (ref 65–99)
Potassium: 5 mmol/L (ref 3.5–5.1)
Sodium: 137 mmol/L (ref 135–145)

## 2016-04-27 LAB — TROPONIN I: Troponin I: <0.03 ng/mL (ref <0.03)

## 2016-04-27 LAB — CALCIUM, IONIZED: Calcium, Ionized, Serum: 4.6 mg/dL (ref 4.5–5.6)

## 2016-04-27 LAB — GLUCOSE, CAPILLARY
GLUCOSE-CAPILLARY: 200 mg/dL — AB (ref 65–99)
Glucose-Capillary: 199 mg/dL — ABNORMAL HIGH (ref 65–99)
Glucose-Capillary: 198 mg/dL — ABNORMAL HIGH (ref 65–99)
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
## 2016-04-27 NOTE — Care Management Obs Status (Signed)
[place] NOTIFICATION   Patient Details  Name: Nichole Munoz MRN: [id number] Date of Birth: 1945/01/17   Medicare Observation Status Notification Given:  No    Prapti Grussing, Antony Haste, RN 04/27/2016, 11:04 AM

## 2016-04-27 NOTE — Care Management CC44 (Signed)
Condition Code 44 Documentation Completed  Patient Details  Name: Nichole Munoz MRN: [id number] Date of Birth: 27-Dec-1945   Condition Code 44 given:  Yes Patient signature on Condition Code 44 notice:   (Son at bedside refused to sign) Documentation of 2 MD's agreement:   (pt status changed without consult of MDs; RNCM became aware during chart review) Code 44 added to claim:  Yes    Danyeal Akens, Antony Haste, RN 04/27/2016, 11:04 AM

## 2016-04-27 NOTE — Care Management Obs Status (Signed)
[place] NOTIFICATION   Patient Details  Name: Nichole Munoz MRN: [id number] Date of Birth: 11-07-1945   Medicare Observation Status Notification Given:  No    Keeshawn Fakhouri, Antony Haste, RN 04/27/2016, 11:24 AM
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
## 2016-04-27 NOTE — Discharge Summary (Signed)
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       ALLERGIES:  Allergies  Allergen Reactions  . Other Other (See Comments)    Chicken causes itching/  clorox, Bleach causes shortness of breath     Discharge Medication List as of 04/27/2016  5:58 PM    START taking these medications   Details  cefpodoxime (VANTIN) 100 MG tablet Take 1 tablet (100 mg total) by mouth every 12 (twelve) hours., Starting Sat 04/26/2016, Until Thu 05/01/2016, Print    HYDROcodone-acetaminophen (NORCO/VICODIN) 5-325 MG tablet Take 1-2 tablets by mouth every 4 (four) hours as needed for moderate pain., Starting Sat 04/26/2016, Print    polyethylene glycol (MIRALAX / GLYCOLAX) packet Take 17 g by mouth daily as needed for moderate constipation., Starting Sat 04/26/2016, Print    senna (SENOKOT) 8.6 MG TABS tablet Take 1 tablet (8.6 mg total) by mouth daily., Starting Sat 04/26/2016, Print      CONTINUE these medications which have NOT CHANGED   Details  albuterol (PROVENTIL HFA;VENTOLIN HFA) 108 (90 Base) MCG/ACT inhaler Inhale 1 puff into the lungs 2 (two) times daily. , Historical Med    aspirin EC 81 MG tablet Take 81 mg by mouth daily., Historical Med      cetirizine (ZYRTEC) 10 MG tablet Take 10 mg by mouth daily., Historical Med    gabapentin (NEURONTIN) 300 MG capsule Take 300 mg by mouth 2 (two) times daily. , Historical Med    glipiZIDE (GLUCOTROL XL) 10 MG 24 hr tablet Take 10 mg by mouth 2 (two) times daily with a meal. , Historical Med    insulin glargine (LANTUS) 100 unit/mL SOPN Inject 24 Units into the skin 2 (two) times daily., Historical Med    lisinopril (PRINIVIL,ZESTRIL) 10 MG tablet Take 10 mg by mouth at bedtime., Historical Med    metFORMIN (GLUCOPHAGE) 850 MG tablet Take 850 mg by mouth 2 (two) times daily with a meal., Historical Med    Multiple Vitamin (MULTIVITAMIN WITH MINERALS) TABS tablet Take 1 tablet by mouth daily., Historical Med    Vitamin D, Ergocalciferol, (DRISDOL) 50000 units CAPS  capsule Take 50,000 Units by mouth every Tuesday. , Historical Med      STOP taking these medications     ibuprofen (ADVIL,MOTRIN) 200 MG tablet      meloxicam (MOBIC) 7.5 MG tablet          Follow-up Information    [facility]. Schedule an appointment as soon as possible for a visit in 1 week(s).   Why:  to undergo blood work to check kidney and potassium levels Contact information: [place] [address] [phone]        THOMPSON, DAVID A., MD Follow up.   Specialty:  Orthopedic Surgery Why:  Dr. Biagio Borg office will call you on Monday to arrange your appointment Contact information: [address] [place] [address] [phone]        CABBELL,KYLE L, MD. Schedule an appointment as soon as possible for a visit in 1 week(s).   Specialty:  Neurosurgery Why:  for cervical spine fracture Contact information: [address] [place]  [address] [place] Follow up.   Why:  awaiting DME recommendations from PT (04/27/2016) Contact information: [address]  [address] [place] Follow up.   Why:  PT/OT has been ordered by your MD and will be provided by above agency. A representative will be in touch with you within 24-48 hours of discharge to make arrangements for your initial visit.  Contact information: [address] [place] [address] [phone]           TOTAL DISCHARGE TIME: 13 minutes  [facility] Pager [phone]  04/28/2016, 12:35 PM

## 2016-04-27 NOTE — Progress Notes (Signed)
Discharge instructions gone over with 2 sons and patient. Home medications gone over. Prescriptions given. Follow up  Appointments to be made. Signs and symptoms of a worsening condition gone over. Reasons to call the doctor discussed. [facility] will be providing equipment and PT/OT to patient. [facility] is aware of patient's discharge this evening.  Diet, bowel regimen, and care of fractures has been discussed. Patient wanted sons to act as the interpretor when she needed something further explained. Patient and family verbalized understanding of instructions.
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
## 2016-04-28 ENCOUNTER — Inpatient Hospital Stay (HOSPITAL_COMMUNITY)
Admission: EM | Admit: 2016-04-28 | Discharge: 2016-05-03 | DRG: 981 | Disposition: A | Discharge status: Home Health | Payer: Medicare Other | Attending: Family Medicine | Admitting: Family Medicine

## 2016-04-28 ENCOUNTER — Emergency Department (HOSPITAL_COMMUNITY): Payer: Medicare Other

## 2016-04-28 ENCOUNTER — Encounter (HOSPITAL_COMMUNITY): Payer: Self-pay | Admitting: *Deleted

## 2016-04-28 DIAGNOSIS — S12690A Other displaced fracture of seventh cervical vertebra, initial encounter for closed fracture: Secondary | ICD-10-CM

## 2016-04-28 DIAGNOSIS — R0902 Hypoxemia: Secondary | ICD-10-CM

## 2016-04-28 DIAGNOSIS — S129XXA Fracture of neck, unspecified, initial encounter: Secondary | ICD-10-CM

## 2016-04-28 DIAGNOSIS — N132 Hydronephrosis with renal and ureteral calculous obstruction: Secondary | ICD-10-CM | POA: Diagnosis present

## 2016-04-28 DIAGNOSIS — K7200 Acute and subacute hepatic failure without coma: Secondary | ICD-10-CM

## 2016-04-28 DIAGNOSIS — N10 Acute tubulo-interstitial nephritis: Secondary | ICD-10-CM

## 2016-04-28 DIAGNOSIS — N179 Acute kidney failure, unspecified: Secondary | ICD-10-CM | POA: Diagnosis not present

## 2016-04-28 DIAGNOSIS — N1831 Chronic kidney disease, stage 3a: Secondary | ICD-10-CM | POA: Diagnosis present

## 2016-04-28 DIAGNOSIS — N183 Chronic kidney disease, stage 3 unspecified: Secondary | ICD-10-CM | POA: Diagnosis present

## 2016-04-28 DIAGNOSIS — S12690S Other displaced fracture of seventh cervical vertebra, sequela: Secondary | ICD-10-CM | POA: Diagnosis not present

## 2016-04-28 DIAGNOSIS — N319 Neuromuscular dysfunction of bladder, unspecified: Secondary | ICD-10-CM | POA: Diagnosis present

## 2016-04-28 DIAGNOSIS — S52502A Unspecified fracture of the lower end of left radius, initial encounter for closed fracture: Secondary | ICD-10-CM

## 2016-04-28 DIAGNOSIS — R338 Other retention of urine: Secondary | ICD-10-CM | POA: Diagnosis not present

## 2016-04-28 DIAGNOSIS — S62109A Fracture of unspecified carpal bone, unspecified wrist, initial encounter for closed fracture: Secondary | ICD-10-CM

## 2016-04-28 DIAGNOSIS — D649 Anemia, unspecified: Secondary | ICD-10-CM | POA: Diagnosis present

## 2016-04-28 DIAGNOSIS — R140 Abdominal distension (gaseous): Secondary | ICD-10-CM

## 2016-04-28 DIAGNOSIS — K5903 Drug induced constipation: Secondary | ICD-10-CM | POA: Diagnosis present

## 2016-04-28 DIAGNOSIS — Z7982 Long term (current) use of aspirin: Secondary | ICD-10-CM

## 2016-04-28 DIAGNOSIS — S52572A Other intraarticular fracture of lower end of left radius, initial encounter for closed fracture: Secondary | ICD-10-CM | POA: Diagnosis present

## 2016-04-28 DIAGNOSIS — Z781 Physical restraint status: Secondary | ICD-10-CM

## 2016-04-28 DIAGNOSIS — Z794 Long term (current) use of insulin: Secondary | ICD-10-CM

## 2016-04-28 DIAGNOSIS — K7682 Hepatic encephalopathy: Secondary | ICD-10-CM

## 2016-04-28 DIAGNOSIS — Z419 Encounter for procedure for purposes other than remedying health state, unspecified: Secondary | ICD-10-CM

## 2016-04-28 DIAGNOSIS — T402X5A Adverse effect of other opioids, initial encounter: Secondary | ICD-10-CM | POA: Diagnosis present

## 2016-04-28 DIAGNOSIS — J45909 Unspecified asthma, uncomplicated: Secondary | ICD-10-CM | POA: Diagnosis present

## 2016-04-28 DIAGNOSIS — G9341 Metabolic encephalopathy: Secondary | ICD-10-CM

## 2016-04-28 DIAGNOSIS — R410 Disorientation, unspecified: Secondary | ICD-10-CM

## 2016-04-28 DIAGNOSIS — E1122 Type 2 diabetes mellitus with diabetic chronic kidney disease: Secondary | ICD-10-CM | POA: Diagnosis present

## 2016-04-28 DIAGNOSIS — Z87891 Personal history of nicotine dependence: Secondary | ICD-10-CM

## 2016-04-28 DIAGNOSIS — E1140 Type 2 diabetes mellitus with diabetic neuropathy, unspecified: Secondary | ICD-10-CM | POA: Diagnosis present

## 2016-04-28 DIAGNOSIS — I10 Essential (primary) hypertension: Secondary | ICD-10-CM | POA: Diagnosis not present

## 2016-04-28 DIAGNOSIS — S52692A Other fracture of lower end of left ulna, initial encounter for closed fracture: Secondary | ICD-10-CM | POA: Diagnosis present

## 2016-04-28 DIAGNOSIS — I129 Hypertensive chronic kidney disease with stage 1 through stage 4 chronic kidney disease, or unspecified chronic kidney disease: Secondary | ICD-10-CM | POA: Diagnosis present

## 2016-04-28 HISTORY — DX: Other retention of urine: R33.8

## 2016-04-28 LAB — CBC WITH DIFFERENTIAL/PLATELET
BASOS PCT: 1 %
Basophils Absolute: 0.1 10*3/uL (ref 0.0–0.1)
Eosinophils Absolute: 0.6 10*3/uL (ref 0.0–0.7)
Eosinophils Relative: 6 %
HEMATOCRIT: 26.1 % — AB (ref 36.0–46.0)
HEMOGLOBIN: 8.7 g/dL — AB (ref 12.0–15.0)
LYMPHS ABS: 2 10*3/uL (ref 0.7–4.0)
Lymphocytes Relative: 20 %
MCH: 29.3 pg (ref 26.0–34.0)
MCHC: 33.3 g/dL (ref 30.0–36.0)
MCV: 87.9 fL (ref 78.0–100.0)
MONOS PCT: 7 %
Monocytes Absolute: 0.7 10*3/uL (ref 0.1–1.0)
NEUTROS PCT: 66 %
Neutro Abs: 6.7 10*3/uL (ref 1.7–7.7)
Platelets: 183 10*3/uL (ref 150–400)
RBC: 2.97 MIL/uL — ABNORMAL LOW (ref 3.87–5.11)
RDW: 12.9 % (ref 11.5–15.5)
WBC: 10 10*3/uL (ref 4.0–10.5)

## 2016-04-28 LAB — LACTIC ACID, PLASMA: LACTIC ACID, VENOUS: 0.9 mmol/L (ref 0.5–1.9)

## 2016-04-28 LAB — COMPREHENSIVE METABOLIC PANEL
ALT: 14 U/L (ref 14–54)
ANION GAP: 8 (ref 5–15)
AST: 24 U/L (ref 15–41)
Albumin: 3.3 g/dL — ABNORMAL LOW (ref 3.5–5.0)
Alkaline Phosphatase: 59 U/L (ref 38–126)
BUN: 22 mg/dL — ABNORMAL HIGH (ref 6–20)
CHLORIDE: 108 mmol/L (ref 101–111)
CO2: 22 mmol/L (ref 22–32)
Calcium: 8.9 mg/dL (ref 8.9–10.3)
Creatinine, Ser: 1.73 mg/dL — ABNORMAL HIGH (ref 0.44–1.00)
GFR calc non Af Amer: 29 mL/min — ABNORMAL LOW (ref >60)
GFR, EST AFRICAN AMERICAN: 33 mL/min — AB (ref >60)
GLUCOSE: 291 mg/dL — AB (ref 65–99)
Potassium: 4.9 mmol/L (ref 3.5–5.1)
Sodium: 138 mmol/L (ref 135–145)
Total Bilirubin: 1 mg/dL (ref 0.3–1.2)
Total Protein: 7.3 g/dL (ref 6.5–8.1)

## 2016-04-28 LAB — URINALYSIS, ROUTINE W REFLEX MICROSCOPIC
BILIRUBIN URINE: NEGATIVE
GLUCOSE, UA: 50 mg/dL — AB
Hgb urine dipstick: NEGATIVE
Ketones, ur: NEGATIVE mg/dL
Leukocytes, UA: NEGATIVE
NITRITE: NEGATIVE
PH: 5 (ref 5.0–8.0)
Protein, ur: NEGATIVE mg/dL
SPECIFIC GRAVITY, URINE: 1.013 (ref 1.005–1.030)

## 2016-04-28 LAB — AMMONIA: Ammonia: 23 umol/L (ref 9–35)

## 2016-04-28 LAB — PATHOLOGIST SMEAR REVIEW

## 2016-04-28 LAB — GLUCOSE, CAPILLARY
Glucose-Capillary: 214 mg/dL — ABNORMAL HIGH (ref 65–99)
Glucose-Capillary: 176 mg/dL — ABNORMAL HIGH (ref 65–99)

## 2016-04-28 LAB — CK: CK TOTAL: 385 U/L — AB (ref 38–234)

## 2016-04-28 LAB — I-STAT CG4 LACTIC ACID, ED: Lactic Acid, Venous: 1.01 mmol/L (ref 0.5–1.9)

## 2016-04-28 MED ORDER — HEPARIN SODIUM (PORCINE) 5000 UNIT/ML IJ SOLN
5000.0000 [IU] | Freq: Three times a day (TID) | INTRAMUSCULAR | Status: AC
  Administered 2016-04-28 – 2016-04-30 (×7): 5000 [IU] via SUBCUTANEOUS
  Filled 2016-04-28 (×7): qty 1

## 2016-04-28 MED ORDER — DICLOFENAC SODIUM 1 % TD GEL
4.0000 g | Freq: Four times a day (QID) | TRANSDERMAL | Status: DC | PRN
  Filled 2016-04-28: qty 100

## 2016-04-28 MED ORDER — ASPIRIN EC 81 MG PO TBEC
81.0000 mg | DELAYED_RELEASE_TABLET | Freq: Every day | ORAL | Status: DC
Start: 2016-04-28 — End: 2016-05-03
  Administered 2016-04-28 – 2016-05-03 (×6): 81 mg via ORAL
  Filled 2016-04-28 (×6): qty 1

## 2016-04-28 MED ORDER — ONDANSETRON HCL 4 MG PO TABS
4.0000 mg | ORAL_TABLET | Freq: Four times a day (QID) | ORAL | Status: DC | PRN
Start: 2016-04-28 — End: 2016-05-03

## 2016-04-28 MED ORDER — SODIUM CHLORIDE 0.9 % IV BOLUS (SEPSIS)
1000.0000 mL | Freq: Once | INTRAVENOUS | Status: AC
  Administered 2016-04-28: 1000 mL via INTRAVENOUS

## 2016-04-28 MED ORDER — OXYCODONE HCL 5 MG PO TABS
5.0000 mg | ORAL_TABLET | Freq: Four times a day (QID) | ORAL | Status: DC | PRN
  Administered 2016-04-28 – 2016-04-30 (×6): 5 mg via ORAL
  Filled 2016-04-28 (×7): qty 1

## 2016-04-28 MED ORDER — DEXTROSE 5 % IV SOLN
1.0000 g | INTRAVENOUS | Status: DC
  Administered 2016-04-29 – 2016-05-01 (×3): 1 g via INTRAVENOUS
  Filled 2016-04-28 (×3): qty 10

## 2016-04-28 MED ORDER — INSULIN ASPART 100 UNIT/ML ~~LOC~~ SOLN
0.0000 [IU] | Freq: Every day | SUBCUTANEOUS | Status: DC
  Administered 2016-04-29: 3 [IU] via SUBCUTANEOUS

## 2016-04-28 MED ORDER — PHENAZOPYRIDINE HCL 100 MG PO TABS
100.0000 mg | ORAL_TABLET | Freq: Three times a day (TID) | ORAL | Status: DC | PRN
  Administered 2016-04-28: 100 mg via ORAL
  Filled 2016-04-28: qty 1

## 2016-04-28 MED ORDER — DEXTROSE 5 % IV SOLN
1.0000 g | Freq: Once | INTRAVENOUS | Status: AC
  Administered 2016-04-28: 1 g via INTRAVENOUS
  Filled 2016-04-28: qty 10

## 2016-04-28 MED ORDER — SODIUM CHLORIDE 0.9 % IV SOLN
INTRAVENOUS | Status: AC
  Administered 2016-04-28 – 2016-04-30 (×3): via INTRAVENOUS

## 2016-04-28 MED ORDER — INSULIN ASPART 100 UNIT/ML ~~LOC~~ SOLN
0.0000 [IU] | Freq: Three times a day (TID) | SUBCUTANEOUS | Status: DC
  Administered 2016-04-28: 3 [IU] via SUBCUTANEOUS
  Administered 2016-04-29: 2 [IU] via SUBCUTANEOUS
  Administered 2016-04-29: 3 [IU] via SUBCUTANEOUS
  Administered 2016-04-29 – 2016-04-30 (×3): 2 [IU] via SUBCUTANEOUS
  Administered 2016-04-30: 3 [IU] via SUBCUTANEOUS
  Administered 2016-05-01: 1 [IU] via SUBCUTANEOUS
  Administered 2016-05-02: 5 [IU] via SUBCUTANEOUS
  Administered 2016-05-02 (×2): 2 [IU] via SUBCUTANEOUS
  Administered 2016-05-03: 1 [IU] via SUBCUTANEOUS
  Administered 2016-05-03: 7 [IU] via SUBCUTANEOUS

## 2016-04-28 MED ORDER — LISINOPRIL 10 MG PO TABS
10.0000 mg | ORAL_TABLET | Freq: Every day | ORAL | Status: DC
  Administered 2016-04-28 – 2016-05-02 (×5): 10 mg via ORAL
  Filled 2016-04-28 (×5): qty 1

## 2016-04-28 MED ORDER — HYDRALAZINE HCL 20 MG/ML IJ SOLN: 5.0000 mg | INTRAMUSCULAR | Status: DC | PRN

## 2016-04-28 MED ORDER — ONDANSETRON HCL 4 MG/2ML IJ SOLN: 4.0000 mg | Freq: Four times a day (QID) | INTRAMUSCULAR | Status: DC | PRN

## 2016-04-28 MED ORDER — INSULIN GLARGINE 100 UNIT/ML ~~LOC~~ SOLN
20.0000 [IU] | Freq: Two times a day (BID) | SUBCUTANEOUS | Status: DC
  Administered 2016-04-28 – 2016-04-29 (×2): 20 [IU] via SUBCUTANEOUS
  Filled 2016-04-28 (×3): qty 0.2

## 2016-04-28 MED ORDER — ACETAMINOPHEN 650 MG RE SUPP
650.0000 mg | Freq: Four times a day (QID) | RECTAL | Status: DC | PRN
Start: 2016-04-28 — End: 2016-05-03

## 2016-04-28 MED ORDER — ALBUTEROL SULFATE (2.5 MG/3ML) 0.083% IN NEBU: 3.0000 mL | INHALATION_SOLUTION | Freq: Four times a day (QID) | RESPIRATORY_TRACT | Status: DC | PRN

## 2016-04-28 MED ORDER — ACETAMINOPHEN 500 MG PO TABS
1000.0000 mg | ORAL_TABLET | Freq: Four times a day (QID) | ORAL | Status: DC | PRN
Start: 2016-04-28 — End: 2016-05-03
  Administered 2016-04-29 – 2016-05-03 (×4): 1000 mg via ORAL
  Filled 2016-04-28 (×4): qty 2

## 2016-04-28 MED ORDER — GABAPENTIN 300 MG PO CAPS
300.0000 mg | ORAL_CAPSULE | Freq: Two times a day (BID) | ORAL | Status: DC
Start: 2016-04-28 — End: 2016-05-03
  Administered 2016-04-28 – 2016-05-03 (×10): 300 mg via ORAL
  Filled 2016-04-28 (×10): qty 1

## 2016-04-28 NOTE — ED Notes (Signed)
Patient transported to X-ray 

## 2016-04-28 NOTE — ED Notes (Signed)
Patient transported to CT 
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
## 2016-04-28 NOTE — ED Notes (Signed)
Attempted report to [facility]W. 
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
## 2016-04-28 NOTE — ED Notes (Signed)
Placed pt on bed pan.
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
## 2016-04-28 NOTE — Progress Notes (Signed)
3 RNs attempted to insert foley cathter with no success md paged

## 2016-04-29 ENCOUNTER — Encounter (HOSPITAL_COMMUNITY): Payer: Self-pay | Admitting: General Practice

## 2016-04-29 ENCOUNTER — Other Ambulatory Visit: Payer: Self-pay | Admitting: Orthopedic Surgery

## 2016-04-29 ENCOUNTER — Observation Stay (HOSPITAL_COMMUNITY): Payer: Medicare Other

## 2016-04-29 DIAGNOSIS — S62102D Fracture of unspecified carpal bone, left wrist, subsequent encounter for fracture with routine healing: Secondary | ICD-10-CM | POA: Diagnosis not present

## 2016-04-29 DIAGNOSIS — E1140 Type 2 diabetes mellitus with diabetic neuropathy, unspecified: Secondary | ICD-10-CM | POA: Diagnosis present

## 2016-04-29 DIAGNOSIS — R410 Disorientation, unspecified: Secondary | ICD-10-CM | POA: Diagnosis not present

## 2016-04-29 DIAGNOSIS — S52572A Other intraarticular fracture of lower end of left radius, initial encounter for closed fracture: Secondary | ICD-10-CM | POA: Diagnosis present

## 2016-04-29 DIAGNOSIS — J45909 Unspecified asthma, uncomplicated: Secondary | ICD-10-CM | POA: Diagnosis present

## 2016-04-29 DIAGNOSIS — D649 Anemia, unspecified: Secondary | ICD-10-CM | POA: Diagnosis present

## 2016-04-29 DIAGNOSIS — G9340 Encephalopathy, unspecified: Secondary | ICD-10-CM

## 2016-04-29 DIAGNOSIS — N179 Acute kidney failure, unspecified: Principal | ICD-10-CM

## 2016-04-29 DIAGNOSIS — R338 Other retention of urine: Secondary | ICD-10-CM | POA: Diagnosis not present

## 2016-04-29 DIAGNOSIS — K7200 Acute and subacute hepatic failure without coma: Secondary | ICD-10-CM | POA: Diagnosis not present

## 2016-04-29 DIAGNOSIS — N183 Chronic kidney disease, stage 3 (moderate): Secondary | ICD-10-CM | POA: Diagnosis present

## 2016-04-29 DIAGNOSIS — N10 Acute tubulo-interstitial nephritis: Secondary | ICD-10-CM | POA: Diagnosis not present

## 2016-04-29 DIAGNOSIS — G9341 Metabolic encephalopathy: Secondary | ICD-10-CM | POA: Diagnosis not present

## 2016-04-29 DIAGNOSIS — E1149 Type 2 diabetes mellitus with other diabetic neurological complication: Secondary | ICD-10-CM | POA: Diagnosis not present

## 2016-04-29 DIAGNOSIS — K5903 Drug induced constipation: Secondary | ICD-10-CM | POA: Diagnosis present

## 2016-04-29 DIAGNOSIS — I10 Essential (primary) hypertension: Secondary | ICD-10-CM | POA: Diagnosis not present

## 2016-04-29 DIAGNOSIS — S52692A Other fracture of lower end of left ulna, initial encounter for closed fracture: Secondary | ICD-10-CM | POA: Diagnosis not present

## 2016-04-29 DIAGNOSIS — Z87891 Personal history of nicotine dependence: Secondary | ICD-10-CM | POA: Diagnosis not present

## 2016-04-29 DIAGNOSIS — Z781 Physical restraint status: Secondary | ICD-10-CM | POA: Diagnosis not present

## 2016-04-29 DIAGNOSIS — R140 Abdominal distension (gaseous): Secondary | ICD-10-CM | POA: Diagnosis present

## 2016-04-29 DIAGNOSIS — Z7982 Long term (current) use of aspirin: Secondary | ICD-10-CM | POA: Diagnosis not present

## 2016-04-29 DIAGNOSIS — T402X5A Adverse effect of other opioids, initial encounter: Secondary | ICD-10-CM | POA: Diagnosis present

## 2016-04-29 DIAGNOSIS — S12690A Other displaced fracture of seventh cervical vertebra, initial encounter for closed fracture: Secondary | ICD-10-CM | POA: Diagnosis not present

## 2016-04-29 DIAGNOSIS — Z794 Long term (current) use of insulin: Secondary | ICD-10-CM | POA: Diagnosis not present

## 2016-04-29 DIAGNOSIS — I129 Hypertensive chronic kidney disease with stage 1 through stage 4 chronic kidney disease, or unspecified chronic kidney disease: Secondary | ICD-10-CM | POA: Diagnosis present

## 2016-04-29 DIAGNOSIS — S12600D Unspecified displaced fracture of seventh cervical vertebra, subsequent encounter for fracture with routine healing: Secondary | ICD-10-CM | POA: Diagnosis not present

## 2016-04-29 DIAGNOSIS — E1122 Type 2 diabetes mellitus with diabetic chronic kidney disease: Secondary | ICD-10-CM | POA: Diagnosis present

## 2016-04-29 LAB — CBC
HEMATOCRIT: 24.3 % — AB (ref 36.0–46.0)
Hemoglobin: 7.9 g/dL — ABNORMAL LOW (ref 12.0–15.0)
MCH: 28.4 pg (ref 26.0–34.0)
MCHC: 32.5 g/dL (ref 30.0–36.0)
MCV: 87.4 fL (ref 78.0–100.0)
PLATELETS: 178 10*3/uL (ref 150–400)
RBC: 2.78 MIL/uL — ABNORMAL LOW (ref 3.87–5.11)
RDW: 12.9 % (ref 11.5–15.5)
WBC: 7 10*3/uL (ref 4.0–10.5)

## 2016-04-29 LAB — COMPREHENSIVE METABOLIC PANEL
ALBUMIN: 2.7 g/dL — AB (ref 3.5–5.0)
ALT: 12 U/L — ABNORMAL LOW (ref 14–54)
AST: 18 U/L (ref 15–41)
Alkaline Phosphatase: 50 U/L (ref 38–126)
Anion gap: 7 (ref 5–15)
BUN: 17 mg/dL (ref 6–20)
CHLORIDE: 111 mmol/L (ref 101–111)
CO2: 24 mmol/L (ref 22–32)
CREATININE: 1.32 mg/dL — AB (ref 0.44–1.00)
Calcium: 8.5 mg/dL — ABNORMAL LOW (ref 8.9–10.3)
GFR calc Af Amer: 46 mL/min — ABNORMAL LOW (ref >60)
GFR, EST NON AFRICAN AMERICAN: 40 mL/min — AB (ref >60)
GLUCOSE: 164 mg/dL — AB (ref 65–99)
POTASSIUM: 4.3 mmol/L (ref 3.5–5.1)
Sodium: 142 mmol/L (ref 135–145)
Total Bilirubin: 0.9 mg/dL (ref 0.3–1.2)
Total Protein: 5.9 g/dL — ABNORMAL LOW (ref 6.5–8.1)

## 2016-04-29 LAB — GLUCOSE, CAPILLARY
GLUCOSE-CAPILLARY: 154 mg/dL — AB (ref 65–99)
GLUCOSE-CAPILLARY: 235 mg/dL — AB (ref 65–99)
Glucose-Capillary: 161 mg/dL — ABNORMAL HIGH (ref 65–99)
Glucose-Capillary: 253 mg/dL — ABNORMAL HIGH (ref 65–99)

## 2016-04-29 MED ORDER — INSULIN GLARGINE 100 UNIT/ML ~~LOC~~ SOLN
15.0000 [IU] | Freq: Two times a day (BID) | SUBCUTANEOUS | Status: DC
  Administered 2016-04-29 – 2016-05-03 (×8): 15 [IU] via SUBCUTANEOUS
  Filled 2016-04-29 (×9): qty 0.15

## 2016-04-29 NOTE — Progress Notes (Signed)
OT Cancellation Note  Patient Details Name: Nichole Munoz MRN: [id number] DOB: [birth year]   Cancelled Treatment:    Reason Eval/Treat Not Completed: Fatigue/lethargy limiting ability to participate. Patient declined to participate in all ADL this session despite encouragement and education concerning importance and benefits of mobility and participation with self-care tasks. Pt's son reports that pt has just received medication and is lethargic and pt was falling asleep while attempting to talk with therapist. Will check back as able for OT evaluation.  Norman Herrlich, MS OTR/L  Pager: [phone]   Norman Herrlich 04/29/2016, 3:18 PM

## 2016-04-29 NOTE — Care Management Obs Status (Signed)
[facility] NOTIFICATION   Patient Details  Name: Nichole Munoz MRN: [id number] Date of Birth: 1945-08-27   Medicare Observation Status Notification Given:  Yes    Sharin Mons, RN 04/29/2016, 5:02 PM

## 2016-04-29 NOTE — Discharge Instructions (Addendum)
PLEASE FOLLOW UP WITH UROLOGY OFFICE IN 1 WEEK.  PLEASE FOLLOW UP WITH ORTHOPEDICS IN 2 WEEKS.  SEE YOUR PRIMARY CARE PROVIDER EARLY NEXT WEEK FOR RECHECK. PLEASE SEEK MEDICAL CARE OR RETURN FOR ANY NEW PROBLEMS.  INSTRUCTIONS ATTACHED FOR HOME FOLEY CATH CARE.     Discharge Instructions for Left Wrist   You have a dressing with a plaster splint incorporated in it. Move your fingers as much as possible, making a full fist and fully opening the fist. Elevate your hand to reduce pain & swelling of the digits.  Ice over the operative site may be helpful to reduce pain & swelling.  DO NOT USE HEAT. Pain medicine has been prescribed for you.  Use your medicine as needed over the first 48 hours, and then you can begin to taper your use.  You may use Tylenol in place of your prescribed pain medication, but not IN ADDITION to it. Leave the dressing in place until you return to our office.  You may shower, but keep the bandage clean & dry.   Please call [phone] during normal business hours or [phone] after hours for any problems. Including the following:  - excessive redness of the incisions - drainage for more than 4 days - fever of more than 101.5 F  *Please note that pain medications will not be refilled after hours or on weekends.
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
## 2016-04-30 DIAGNOSIS — R410 Disorientation, unspecified: Secondary | ICD-10-CM

## 2016-04-30 DIAGNOSIS — K7200 Acute and subacute hepatic failure without coma: Secondary | ICD-10-CM

## 2016-04-30 DIAGNOSIS — N183 Chronic kidney disease, stage 3 (moderate): Secondary | ICD-10-CM

## 2016-04-30 DIAGNOSIS — E1149 Type 2 diabetes mellitus with other diabetic neurological complication: Secondary | ICD-10-CM

## 2016-04-30 DIAGNOSIS — R140 Abdominal distension (gaseous): Secondary | ICD-10-CM

## 2016-04-30 DIAGNOSIS — I10 Essential (primary) hypertension: Secondary | ICD-10-CM

## 2016-04-30 DIAGNOSIS — N10 Acute tubulo-interstitial nephritis: Secondary | ICD-10-CM

## 2016-04-30 DIAGNOSIS — R338 Other retention of urine: Secondary | ICD-10-CM

## 2016-04-30 LAB — BASIC METABOLIC PANEL
Anion gap: 8 (ref 5–15)
BUN: 10 mg/dL (ref 6–20)
CO2: 20 mmol/L — ABNORMAL LOW (ref 22–32)
CREATININE: 1.01 mg/dL — AB (ref 0.44–1.00)
Calcium: 7.2 mg/dL — ABNORMAL LOW (ref 8.9–10.3)
Chloride: 114 mmol/L — ABNORMAL HIGH (ref 101–111)
GFR calc Af Amer: >60 mL/min (ref >60)
GFR, EST NON AFRICAN AMERICAN: 55 mL/min — AB (ref >60)
Glucose, Bld: 138 mg/dL — ABNORMAL HIGH (ref 65–99)
POTASSIUM: 3.6 mmol/L (ref 3.5–5.1)
Sodium: 142 mmol/L (ref 135–145)

## 2016-04-30 LAB — GLUCOSE, CAPILLARY
GLUCOSE-CAPILLARY: 188 mg/dL — AB (ref 65–99)
GLUCOSE-CAPILLARY: 154 mg/dL — AB (ref 65–99)
Glucose-Capillary: 158 mg/dL — ABNORMAL HIGH (ref 65–99)
Glucose-Capillary: 227 mg/dL — ABNORMAL HIGH (ref 65–99)

## 2016-04-30 LAB — CBC
HCT: 22.1 % — ABNORMAL LOW (ref 36.0–46.0)
Hemoglobin: 7.6 g/dL — ABNORMAL LOW (ref 12.0–15.0)
MCH: 30 pg (ref 26.0–34.0)
MCHC: 34.4 g/dL (ref 30.0–36.0)
MCV: 87.4 fL (ref 78.0–100.0)
PLATELETS: 168 10*3/uL (ref 150–400)
RBC: 2.53 MIL/uL — ABNORMAL LOW (ref 3.87–5.11)
RDW: 13 % (ref 11.5–15.5)
WBC: 4.1 10*3/uL (ref 4.0–10.5)

## 2016-04-30 LAB — URINE CULTURE: CULTURE: NO GROWTH

## 2016-04-30 MED ORDER — CEFAZOLIN (ANCEF) 1 G IV SOLR: 2.0000 g | INTRAVENOUS | Status: DC

## 2016-04-30 MED ORDER — HYDROMORPHONE HCL 1 MG/ML IJ SOLN
0.5000 mg | Freq: Once | INTRAMUSCULAR | Status: AC
  Administered 2016-04-30: 0.5 mg via INTRAVENOUS

## 2016-04-30 MED ORDER — CEFAZOLIN SODIUM-DEXTROSE 2-4 GM/100ML-% IV SOLN
2.0000 g | Freq: Once | INTRAVENOUS | Status: AC
  Administered 2016-05-01: 2 g via INTRAVENOUS
  Filled 2016-04-30: qty 100

## 2016-04-30 MED ORDER — OXYCODONE HCL 5 MG PO TABS
10.0000 mg | ORAL_TABLET | ORAL | Status: DC | PRN
  Administered 2016-04-30 – 2016-05-03 (×12): 10 mg via ORAL
  Filled 2016-04-30 (×12): qty 2

## 2016-04-30 MED ORDER — HYDROMORPHONE HCL 1 MG/ML IJ SOLN
INTRAMUSCULAR | Status: AC
  Filled 2016-04-30: qty 1
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
## 2016-04-30 NOTE — Progress Notes (Signed)
Inpatient Diabetes Program Recommendations  AACE/ADA: New Consensus Statement on Inpatient Glycemic Control (2015)  Target Ranges:  Prepandial:   less than 140 mg/dL      Peak postprandial:   less than 180 mg/dL (1-2 hours)      Critically ill patients:  140 - 180 mg/dL   Lab Results  Component Value Date   GLUCAP 227 (H) 04/30/2016   HGBA1C 10.0 (H) 06/13/2015    Review of Glycemic Control:Results for Nichole Munoz, Nichole Munoz (MRN [id number]) as of 04/30/2016 13:12  Ref. Range 04/29/2016 17:00 04/29/2016 21:58 04/30/2016 08:16 04/30/2016 11:56  Glucose-Capillary Latest Ref Range: 65 - 99 mg/dL 161 (H) 253 (H) 158 (H) 227 (H)    Diabetes history: Type 2 Outpatient Diabetes medications:  Metformin 850 mg bid, Lantus 24 units bid, Glipizide 10 mg q AM Current orders for Inpatient glycemic control:  Lantus 15 units bid, Novolog sensitive tid with meals and HS  Inpatient Diabetes Program Recommendations:     Consider adding Novolog meal coverage 3 units tid with meals.  Also consider increasing Novolog correction to q 4 hours once NPO.   Thanks, Adah Perl, RN, BC-ADM Inpatient Diabetes Coordinator Pager [phone] (8a-5p)
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
## 2016-05-01 ENCOUNTER — Encounter (HOSPITAL_COMMUNITY)
Admission: EM | Disposition: A | Discharge status: Home Health | Payer: Self-pay | Source: Home / Self Care | Attending: Family Medicine

## 2016-05-01 ENCOUNTER — Inpatient Hospital Stay (HOSPITAL_COMMUNITY): Payer: Medicare Other

## 2016-05-01 ENCOUNTER — Inpatient Hospital Stay (HOSPITAL_COMMUNITY): Payer: Medicare Other | Admitting: Anesthesiology

## 2016-05-01 ENCOUNTER — Encounter (HOSPITAL_COMMUNITY): Payer: Self-pay | Admitting: Surgery

## 2016-05-01 HISTORY — PX: OPEN REDUCTION INTERNAL FIXATION (ORIF) DISTAL RADIAL FRACTURE: SHX5989

## 2016-05-01 LAB — GLUCOSE, CAPILLARY
GLUCOSE-CAPILLARY: 123 mg/dL — AB (ref 65–99)
Glucose-Capillary: 117 mg/dL — ABNORMAL HIGH (ref 65–99)
Glucose-Capillary: 135 mg/dL — ABNORMAL HIGH (ref 65–99)
Glucose-Capillary: 187 mg/dL — ABNORMAL HIGH (ref 65–99)

## 2016-05-01 LAB — SURGICAL PCR SCREEN
MRSA, PCR: NEGATIVE
Staphylococcus aureus: NEGATIVE

## 2016-05-01 SURGERY — OPEN REDUCTION INTERNAL FIXATION (ORIF) DISTAL RADIUS FRACTURE
Anesthesia: General | Laterality: Left

## 2016-05-01 MED ORDER — PHENYLEPHRINE 40 MCG/ML (10ML) SYRINGE FOR IV PUSH (FOR BLOOD PRESSURE SUPPORT)
PREFILLED_SYRINGE | INTRAVENOUS | Status: DC | PRN
  Administered 2016-05-01: 120 ug via INTRAVENOUS
  Administered 2016-05-01 (×4): 80 ug via INTRAVENOUS

## 2016-05-01 MED ORDER — BUPIVACAINE HCL (PF) 0.25 % IJ SOLN
INTRAMUSCULAR | Status: DC | PRN
  Administered 2016-05-01: 10 mL

## 2016-05-01 MED ORDER — ONDANSETRON HCL 4 MG/2ML IJ SOLN
INTRAMUSCULAR | Status: DC | PRN
  Administered 2016-05-01: 4 mg via INTRAVENOUS

## 2016-05-01 MED ORDER — LIDOCAINE 2% (20 MG/ML) 5 ML SYRINGE
INTRAMUSCULAR | Status: DC | PRN
  Administered 2016-05-01: 60 mg via INTRAVENOUS

## 2016-05-01 MED ORDER — HYDROMORPHONE HCL 1 MG/ML IJ SOLN
INTRAMUSCULAR | Status: AC
Start: 2016-05-01 — End: 2016-05-01
  Administered 2016-05-01: 0.5 mg via INTRAVENOUS
  Filled 2016-05-01: qty 0.5

## 2016-05-01 MED ORDER — HYDROMORPHONE HCL 1 MG/ML IJ SOLN
0.2500 mg | INTRAMUSCULAR | Status: DC | PRN
  Administered 2016-05-01 (×2): 0.5 mg via INTRAVENOUS

## 2016-05-01 MED ORDER — LACTATED RINGERS IV SOLN
INTRAVENOUS | Status: DC
  Administered 2016-05-01 (×4): via INTRAVENOUS

## 2016-05-01 MED ORDER — 0.9 % SODIUM CHLORIDE (POUR BTL) OPTIME
TOPICAL | Status: DC | PRN
  Administered 2016-05-01: 1000 mL

## 2016-05-01 MED ORDER — FENTANYL CITRATE (PF) 100 MCG/2ML IJ SOLN
INTRAMUSCULAR | Status: DC | PRN
  Administered 2016-05-01: 25 ug via INTRAVENOUS
  Administered 2016-05-01: 100 ug via INTRAVENOUS

## 2016-05-01 MED ORDER — HYDROMORPHONE HCL 1 MG/ML IJ SOLN
0.5000 mg | INTRAMUSCULAR | Status: DC | PRN
  Administered 2016-05-01 – 2016-05-02 (×3): 0.5 mg via INTRAVENOUS
  Filled 2016-05-01 (×4): qty 1

## 2016-05-01 MED ORDER — PROMETHAZINE HCL 25 MG/ML IJ SOLN
6.2500 mg | INTRAMUSCULAR | Status: DC | PRN
  Administered 2016-05-01: 12.5 mg via INTRAVENOUS

## 2016-05-01 MED ORDER — MEPERIDINE HCL 25 MG/ML IJ SOLN: 6.2500 mg | INTRAMUSCULAR | Status: DC | PRN

## 2016-05-01 MED ORDER — PROMETHAZINE HCL 25 MG/ML IJ SOLN
INTRAMUSCULAR | Status: AC
  Administered 2016-05-01: 12.5 mg via INTRAVENOUS
  Filled 2016-05-01: qty 1

## 2016-05-01 MED ORDER — HYDROMORPHONE HCL 1 MG/ML IJ SOLN
INTRAMUSCULAR | Status: AC
  Administered 2016-05-01: 0.5 mg via INTRAVENOUS
  Filled 2016-05-01: qty 0.5

## 2016-05-01 MED ORDER — TAMSULOSIN HCL 0.4 MG PO CAPS
0.4000 mg | ORAL_CAPSULE | Freq: Every day | ORAL | Status: DC
  Administered 2016-05-01 – 2016-05-02 (×2): 0.4 mg via ORAL
  Filled 2016-05-01 (×3): qty 1

## 2016-05-01 MED ORDER — BISACODYL 10 MG RE SUPP: 10.0000 mg | Freq: Every day | RECTAL | Status: DC | PRN

## 2016-05-01 MED ORDER — BUPIVACAINE HCL (PF) 0.25 % IJ SOLN
INTRAMUSCULAR | Status: AC
  Filled 2016-05-01: qty 30

## 2016-05-01 MED ORDER — ONDANSETRON HCL 4 MG/2ML IJ SOLN
INTRAMUSCULAR | Status: AC
  Filled 2016-05-01: qty 2

## 2016-05-01 MED ORDER — LIDOCAINE HCL 1 % IJ SOLN
INTRAMUSCULAR | Status: AC
  Filled 2016-05-01: qty 40

## 2016-05-01 MED ORDER — PHENYLEPHRINE 40 MCG/ML (10ML) SYRINGE FOR IV PUSH (FOR BLOOD PRESSURE SUPPORT)
PREFILLED_SYRINGE | INTRAVENOUS | Status: AC
  Filled 2016-05-01: qty 10

## 2016-05-01 MED ORDER — SENNOSIDES-DOCUSATE SODIUM 8.6-50 MG PO TABS
1.0000 | ORAL_TABLET | Freq: Two times a day (BID) | ORAL | Status: DC
  Administered 2016-05-01 – 2016-05-03 (×5): 1 via ORAL
  Filled 2016-05-01 (×5): qty 1

## 2016-05-01 MED ORDER — PROPOFOL 10 MG/ML IV BOLUS
INTRAVENOUS | Status: DC | PRN
  Administered 2016-05-01: 160 mg via INTRAVENOUS

## 2016-05-01 MED ORDER — LACTATED RINGERS IV SOLN: INTRAVENOUS | Status: DC

## 2016-05-01 MED ORDER — FENTANYL CITRATE (PF) 250 MCG/5ML IJ SOLN
INTRAMUSCULAR | Status: AC
  Filled 2016-05-01: qty 5

## 2016-05-01 MED ORDER — PROPOFOL 10 MG/ML IV BOLUS
INTRAVENOUS | Status: AC
  Filled 2016-05-01: qty 20

## 2016-05-01 SURGICAL SUPPLY — 72 items
BANDAGE COBAN STERILE 2 (GAUZE/BANDAGES/DRESSINGS) IMPLANT
BIT DRILL 2.2 SS TIBIAL (BIT) ×2 IMPLANT
BLADE SURG 15 STRL LF DISP TIS (BLADE) ×1 IMPLANT
BLADE SURG 15 STRL SS (BLADE) ×3
BNDG CMPR 9X4 STRL LF SNTH (GAUZE/BANDAGES/DRESSINGS) ×1
BNDG COHESIVE 4X5 TAN STRL (GAUZE/BANDAGES/DRESSINGS) ×3 IMPLANT
BNDG ESMARK 4X9 LF (GAUZE/BANDAGES/DRESSINGS) ×3 IMPLANT
BNDG GAUZE ELAST 4 BULKY (GAUZE/BANDAGES/DRESSINGS) ×4 IMPLANT
BRUSH SCRUB EZ PLAIN DRY (MISCELLANEOUS) IMPLANT
BRUSH SCRUB SURG 4.25 DISP (MISCELLANEOUS) ×2 IMPLANT
CANISTER SUCT 3000ML PPV (MISCELLANEOUS) ×3 IMPLANT
CHLORAPREP W/TINT 26ML (MISCELLANEOUS) ×5 IMPLANT
CORDS BIPOLAR (ELECTRODE) ×3 IMPLANT
COVER BACK TABLE 60X90IN (DRAPES) ×1 IMPLANT
COVER SURGICAL LIGHT HANDLE (MISCELLANEOUS) ×3 IMPLANT
CUFF TOURNIQUET SINGLE 18IN (TOURNIQUET CUFF) IMPLANT
CUFF TOURNIQUET SINGLE 24IN (TOURNIQUET CUFF) IMPLANT
DRAPE C-ARM 42X72 X-RAY (DRAPES) ×3 IMPLANT
DRAPE SURG 17X23 STRL (DRAPES) ×3 IMPLANT
DRSG ADAPTIC 3X8 NADH LF (GAUZE/BANDAGES/DRESSINGS) ×3 IMPLANT
DRSG EMULSION OIL 3X3 NADH (GAUZE/BANDAGES/DRESSINGS) ×2 IMPLANT
GAUZE SPONGE 4X4 12PLY STRL (GAUZE/BANDAGES/DRESSINGS) ×3 IMPLANT
GLOVE BIO SURGEON STRL SZ 6.5 (GLOVE) ×1 IMPLANT
GLOVE BIO SURGEON STRL SZ7.5 (GLOVE) ×5 IMPLANT
GLOVE BIO SURGEONS STRL SZ 6.5 (GLOVE) ×1
GLOVE BIOGEL PI IND STRL 6.5 (GLOVE) IMPLANT
GLOVE BIOGEL PI IND STRL 8 (GLOVE) ×1 IMPLANT
GLOVE BIOGEL PI INDICATOR 6.5 (GLOVE) ×4
GLOVE BIOGEL PI INDICATOR 8 (GLOVE) ×2
GLOVE SURG SS PI 7.0 STRL IVOR (GLOVE) ×2 IMPLANT
GOWN STRL REUS W/ TWL LRG LVL3 (GOWN DISPOSABLE) IMPLANT
GOWN STRL REUS W/ TWL XL LVL3 (GOWN DISPOSABLE) ×1 IMPLANT
GOWN STRL REUS W/TWL LRG LVL3 (GOWN DISPOSABLE) ×6
GOWN STRL REUS W/TWL XL LVL3 (GOWN DISPOSABLE) ×3
K-WIRE 1.6 (WIRE) ×9
K-WIRE FX5X1.6XNS BN SS (WIRE) ×3
KIT BASIN OR (CUSTOM PROCEDURE TRAY) ×3 IMPLANT
KWIRE FX5X1.6XNS BN SS (WIRE) IMPLANT
NDL HYPO 25X1 1.5 SAFETY (NEEDLE) IMPLANT
NEEDLE HYPO 22GX1.5 SAFETY (NEEDLE) ×3 IMPLANT
NEEDLE HYPO 25X1 1.5 SAFETY (NEEDLE) IMPLANT
NS IRRIG 1000ML POUR BTL (IV SOLUTION) ×3 IMPLANT
PACK ORTHO EXTREMITY (CUSTOM PROCEDURE TRAY) ×3 IMPLANT
PAD CAST 4YDX4 CTTN HI CHSV (CAST SUPPLIES) ×1 IMPLANT
PADDING CAST ABS 4INX4YD NS (CAST SUPPLIES) ×2
PADDING CAST ABS COTTON 4X4 ST (CAST SUPPLIES) IMPLANT
PADDING CAST COTTON 4X4 STRL (CAST SUPPLIES) ×3
PEG LOCKING SMOOTH 2.2X13 (Peg) ×2 IMPLANT
PEG LOCKING SMOOTH 2.2X16 (Screw) ×2 IMPLANT
PEG LOCKING SMOOTH 2.2X20 (Screw) ×6 IMPLANT
PEG LOCKING SMOOTH 2.2X22 (Screw) ×6 IMPLANT
PENCIL BUTTON HOLSTER BLD 10FT (ELECTRODE) IMPLANT
PLATE DVR DORSAL L CROSSLOCK (Plate) ×2 IMPLANT
PLATE NARROW DVR LEFT (Plate) ×2 IMPLANT
RUBBERBAND STERILE (MISCELLANEOUS) IMPLANT
SCREW  LP NL 2.7X13MM (Screw) ×4 IMPLANT
SCREW LOCK 12X2.7X 3 LD (Screw) IMPLANT
SCREW LOCK 14X2.7X 3 LD TPR (Screw) IMPLANT
SCREW LOCKING 2.7X12MM (Screw) ×6 IMPLANT
SCREW LOCKING 2.7X13MM (Screw) ×6 IMPLANT
SCREW LOCKING 2.7X14 (Screw) ×12 IMPLANT
SCREW LOCKING 2.7X15MM (Screw) ×2 IMPLANT
SCREW LP NL 2.7X13MM (Screw) IMPLANT
SCREW MULTI DIRECTIONAL 2.7X18 (Screw) ×2 IMPLANT
SUT VIC AB 2-0 CT3 27 (SUTURE) ×3 IMPLANT
SUT VICRYL 4-0 PS2 18IN ABS (SUTURE) IMPLANT
SUT VICRYL RAPIDE 4/0 PS 2 (SUTURE) ×3 IMPLANT
SYR 10ML LL (SYRINGE) IMPLANT
TOWEL OR 17X24 6PK STRL BLUE (TOWEL DISPOSABLE) ×3 IMPLANT
TUBE CONNECTING 12'X1/4 (SUCTIONS) ×1
TUBE CONNECTING 12X1/4 (SUCTIONS) ×2 IMPLANT
UNDERPAD 30X30 (UNDERPADS AND DIAPERS) ×3 IMPLANT

## 2016-05-01 NOTE — Progress Notes (Signed)
Patient is trying to get out of bed and pull things out. MD has been notified. Awaiting new orders

## 2016-05-01 NOTE — Progress Notes (Signed)
Spoke with  Gaspar Skeeters PA, about patient Desatting from removing oxygen and  that Dr. Wynetta Emery was also  notified that medical restraints where ordered at this for safety and a chest x ray was also ordered. Continue to monitor and notify Clair Gulling

## 2016-05-01 NOTE — Progress Notes (Signed)
PT Cancellation Note  Patient Details Name: Nichole Munoz MRN: [id number] DOB: 12-07-1945   Cancelled Treatment:    Reason Eval/Treat Not Completed: Patient at procedure or test/unavailable.  Will see pt later as able. 05/01/2016  Donnella Sham, PT [phone]  (pager)   [name] 05/01/2016, 2:11 PM

## 2016-05-01 NOTE — Progress Notes (Signed)
Notified Dr. Wynetta Emery that patient is pulling off oxygen and Desatting to 70's, Patient also becomes agitated when RN request that patient does not remove O2. Will notify ortho.

## 2016-05-01 NOTE — Transfer of Care (Signed)
Immediate Anesthesia Transfer of Care Note  Patient: Nichole Munoz  Procedure(s) Performed: Procedure(s): OPEN REDUCTION INTERNAL FIXATION (ORIF) DISTAL RADIAL FRACTURE AND ULNA  FRACTURE (Left)  Patient Location: PACU  Anesthesia Type:General  Level of Consciousness: awake  Airway & Oxygen Therapy: Patient Spontanous Breathing and Patient connected to nasal cannula oxygen  Post-op Assessment: Report given to RN, Post -op Vital signs reviewed and stable and Patient moving all extremities  Post vital signs: Reviewed and stable  Last Vitals:  Vitals:   05/01/16 0222 05/01/16 0538  BP: (!) 158/75 (!) 163/84  Pulse: 91 (!) 101  Resp: 18 18  Temp:  37.2 C    Last Pain:  Vitals:   05/01/16 1229  TempSrc:   PainSc: 2          Complications: No apparent anesthesia complications

## 2016-05-01 NOTE — Anesthesia Preprocedure Evaluation (Addendum)
Anesthesia Evaluation  Patient identified by MRN, date of birth, ID band Patient awake    Reviewed: Allergy & Precautions, NPO status , Patient's Chart, lab work & pertinent test results  Airway Mallampati: II  TM Distance: >3 FB Neck ROM: Full    Dental  (+) Dental Advisory Given, Missing,    Pulmonary asthma ,    breath sounds clear to auscultation       Cardiovascular hypertension, Pt. on medications  Rhythm:Regular Rate:Normal     Neuro/Psych PSYCHIATRIC DISORDERS negative neurological ROS     GI/Hepatic negative GI ROS, Neg liver ROS,   Endo/Other  diabetes, Type 2, Oral Hypoglycemic Agents, Insulin Dependent  Renal/GU Renal disease  negative genitourinary   Musculoskeletal  (+) Arthritis , Osteoarthritis,    Abdominal   Peds negative pediatric ROS (+)  Hematology negative hematology ROS (+)   Anesthesia Other Findings   Reproductive/Obstetrics negative OB ROS                           Lab Results  Component Value Date   WBC 4.1 04/30/2016   HGB 7.6 (L) 04/30/2016   HCT 22.1 (L) 04/30/2016   MCV 87.4 04/30/2016   PLT 168 04/30/2016   Lab Results  Component Value Date   CREATININE 1.01 (H) 04/30/2016   BUN 10 04/30/2016   NA 142 04/30/2016   K 3.6 04/30/2016   CL 114 (H) 04/30/2016   CO2 20 (L) 04/30/2016   Lab Results  Component Value Date   INR 1.03 09/23/2014   INR 1.11 07/21/2010   04/2016 EKG: sinus tachycardia.  Echo: - Left ventricle: The cavity size was normal. Systolic function was   normal. The estimated ejection fraction was in the range of 55%   to 60%. Wall motion was normal; there were no regional wall   motion abnormalities. There was fusion of early and atrial   contributions to ventricular filling. - Aortic valve: A bicuspid morphology cannot be excluded; mildly   thickened, moderately calcified leaflets. There was mild   regurgitation. - Mitral  valve: There was trivial regurgitation.   Anesthesia Physical Anesthesia Plan  ASA: III  Anesthesia Plan: General   Post-op Pain Management:    Induction: Intravenous  Airway Management Planned: LMA  Additional Equipment:   Intra-op Plan:   Post-operative Plan: Extubation in OR  Informed Consent: I have reviewed the patients History and Physical, chart, labs and discussed the procedure including the risks, benefits and alternatives for the proposed anesthesia with the patient or authorized representative who has indicated his/her understanding and acceptance.   Dental advisory given  Plan Discussed with: CRNA  Anesthesia Plan Comments: (Pt declines nerve block. )       Anesthesia Quick Evaluation
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
## 2016-05-01 NOTE — Anesthesia Procedure Notes (Signed)
Procedure Name: LMA Insertion Date/Time: 05/01/2016 2:50 PM Performed by: Trixie Deis A Pre-anesthesia Checklist: Patient identified, Emergency Drugs available, Suction available and Patient being monitored Patient Re-evaluated:Patient Re-evaluated prior to inductionOxygen Delivery Method: Circle System Utilized Preoxygenation: Pre-oxygenation with 100% oxygen Intubation Type: IV induction Ventilation: Mask ventilation without difficulty LMA: LMA inserted LMA Size: 4.0 Number of attempts: 1 Airway Equipment and Method: Bite block Placement Confirmation: positive ETCO2 Tube secured with: Tape Dental Injury: Teeth and Oropharynx as per pre-operative assessment

## 2016-05-01 NOTE — Progress Notes (Signed)
Pt reported not having BM since last Friday. MD notified.

## 2016-05-01 NOTE — Op Note (Signed)
04/28/2016 - 05/01/2016  4:59 PM  PATIENT:  Nichole Munoz  71 y.o. female  PRE-OPERATIVE DIAGNOSIS:  Displaced left distal radius and ulna fractures  POST-OPERATIVE DIAGNOSIS:  Same  PROCEDURE:   1. ORIF L distal radius intra-articular fracture (2 intra-artic frags)    2. ORIF L distal ulna fx  SURGEON: Rayvon Char. Grandville Silos, MD  PHYSICIAN ASSISTANT: Morley Kos, OPA-C  ANESTHESIA:  general  SPECIMENS:  None  DRAINS: None  EBL:  less than 50 mL  PREOPERATIVE INDICATIONS:  [name] is a  71 y.o. female with a comminuted L intra-articular distal radius fracture and a displaced comminuted distal ulna fracture  The risks benefits and alternatives were discussed with the patient preoperatively including but not limited to the risks of infection, bleeding, nerve injury, cardiopulmonary complications, the need for revision surgery, among others, and the patient verbalized understanding and consented to proceed.  OPERATIVE IMPLANTS: Biomet DVR Crosslock plate/screws/ pegs and distal ulna plate/screws  OPERATIVE PROCEDURE: After receiving prophylactic antibiotics, the patient was escorted to the operative theatre and placed in a supine position. General anesthesia was administered.  A surgical "time-out" was performed during which the planned procedure, proposed operative site, and the correct patient identity were compared to the operative consent and agreement confirmed by the circulating nurse according to current facility policy. Following application of a tourniquet to the operative extremity, the exposed skin was pre-scrubbed with a Hibiclens scrub brush and then was prepped with Chloraprep and draped in the usual sterile fashion. The limb was exsanguinated with an Esmarch bandage and the tourniquet inflated to approximately 190mmHg higher than systolic BP.   A sinusoidal-shaped incision was marked and made over the FCR axis and the distal forearm. The skin was incised sharply with scalpel,  subcutaneous tissues with blunt and spreading dissection. The FCR axis was exploited deeply. The pronator quadratus was reflected in an L-shaped ulnarly, lthough it was fairly shredded. The fracture was inspected and provisionally reduced.  This was confirmed fluoroscopically. The appropriately sized plate was selected and found to fit well. It was placed in its provisional alignment of the radius and this was confirmed fluoroscopically.  It was secured to the radius with a screw through the slotted hole.  Additional adjustments were made as necessary, and the distal holes were all drilled and filled.  Peg/screw length distally was selected on the shorter side of measurements to minimize the risk for dorsal cortical penetration. The remainder of the proximal holes were drilled and filled.    Attention was then shifted to the distal ulna.  There was a comminuted head fracture which was displaced, with the head nearly 100% off of the neck.  It was decided that due to the degree of displacement and its effect on the distal radial ulnar joint, we would attempt to improve the alignment and stability.  A separate ulnar incision was made over the subcutaneous border, dissecting with blunt, spreading, sharp dissection down to the bone where subperiosteal dissection was performed in the interval between the FCU and ECU.  With the ulna thus expose, working through both incisions, the fracture fragments were grossly reduced and the distal ulna plate was applied and secured, with one of the distal screws being polyaxial.  This appeared to provide for more anatomic alignment of the distal ulna and reasonably good stability.  Final images were obtained and the DRUJ was examined for stability. It was found to be sufficiently stable. He wound was then copiously irrigated.  Attention was then  shifted to the distal ulna.  There was a comminuted head fracture which was displaced, with the head nearly 100% off of the neck.  It was  decided that due to the degree of displacement and its effect on the distal radial ulnar joint, we would attempt to improve the alignment and stability.  A separate ulnar incision was made over the subcutaneous border, dissecting with blunt, spreading, sharp dissection down to the bone where subperiosteal dissection was performed in the interval between the FCU and ECU.  With the ulna thus expose, working through both incisions, the fracture fragments were grossly reduced and the distal ulna plate was applied and secured, with one of the distal screws being polyaxial.  This appeared to provide for more anatomic alignment of the distal ulna and reasonably good stability.  On the ulnar side, 2-0 Vicryl suture was used to reapproximate the fascial layer.  In addition, the dorsal cutaneous branch of the ulnar nerve was again identified and found to have remained intact during the ulnar-sided procedure.  The skin ulnarly was then reapproximated with 2-0 Vicryl deep dermal buried sutures and running 4-0 Vicryl Rapide horizontal mattress suture in the skin.  Tourniquet was released and additional hemostasis obtained and the skin of the radial approach was closed with 2-0 Vicryl deep dermal buried sutures followed by running 4-0 Vicryl Rapide horizontal mattress suture in the skin. A sugar tong splint was applied, with the forearm in neutral.  She was awakened and taken to the recovery room in stable condition, breathing spontaneously.  DISPOSITION: The patient will be transferred to the floor with typical post-op instructions, returning in 10-15 days for reevaluation with new x-rays of the affected wrist out of the splint to include an inclined lateral and then transition to therapy to have a custom [place] splint constructed and begin rehabilitation.

## 2016-05-01 NOTE — Progress Notes (Signed)
Interpreter line used.  Interpreter # [phone]

## 2016-05-01 NOTE — Progress Notes (Signed)
05/01/2016 6:38 PM  RN reporting patient pulling off oxygen and desatting to 70s.  Just came back from OR procedure.  RN asking to restrain patient for safety.  Will order stat CXR, safety restraints.    Murvin Natal, MD

## 2016-05-02 ENCOUNTER — Encounter (HOSPITAL_COMMUNITY): Payer: Self-pay | Admitting: Orthopedic Surgery

## 2016-05-02 LAB — GLUCOSE, CAPILLARY
GLUCOSE-CAPILLARY: 196 mg/dL — AB (ref 65–99)
Glucose-Capillary: 181 mg/dL — ABNORMAL HIGH (ref 65–99)
Glucose-Capillary: 280 mg/dL — ABNORMAL HIGH (ref 65–99)
Glucose-Capillary: 164 mg/dL — ABNORMAL HIGH (ref 65–99)

## 2016-05-02 LAB — CBC
HEMATOCRIT: 27.2 % — AB (ref 36.0–46.0)
Hemoglobin: 9.1 g/dL — ABNORMAL LOW (ref 12.0–15.0)
MCH: 29.2 pg (ref 26.0–34.0)
MCHC: 33.5 g/dL (ref 30.0–36.0)
MCV: 87.2 fL (ref 78.0–100.0)
Platelets: 258 10*3/uL (ref 150–400)
RBC: 3.12 MIL/uL — ABNORMAL LOW (ref 3.87–5.11)
RDW: 13.5 % (ref 11.5–15.5)
WBC: 11.7 10*3/uL — ABNORMAL HIGH (ref 4.0–10.5)

## 2016-05-02 LAB — BASIC METABOLIC PANEL
Anion gap: 9 (ref 5–15)
BUN: 12 mg/dL (ref 6–20)
CALCIUM: 8.2 mg/dL — AB (ref 8.9–10.3)
CO2: 28 mmol/L (ref 22–32)
Chloride: 97 mmol/L — ABNORMAL LOW (ref 101–111)
Creatinine, Ser: 1.17 mg/dL — ABNORMAL HIGH (ref 0.44–1.00)
GFR calc Af Amer: 53 mL/min — ABNORMAL LOW (ref >60)
GFR, EST NON AFRICAN AMERICAN: 46 mL/min — AB (ref >60)
GLUCOSE: 170 mg/dL — AB (ref 65–99)
POTASSIUM: 3.9 mmol/L (ref 3.5–5.1)
Sodium: 134 mmol/L — ABNORMAL LOW (ref 135–145)

## 2016-05-02 MED ORDER — INSULIN ASPART 100 UNIT/ML ~~LOC~~ SOLN
5.0000 [IU] | Freq: Three times a day (TID) | SUBCUTANEOUS | Status: DC
  Administered 2016-05-03 (×2): 5 [IU] via SUBCUTANEOUS

## 2016-05-02 MED ORDER — BISACODYL 10 MG RE SUPP
10.0000 mg | Freq: Once | RECTAL | Status: AC
  Administered 2016-05-02: 10 mg via RECTAL
  Filled 2016-05-02: qty 1

## 2016-05-02 MED ORDER — POLYETHYLENE GLYCOL 3350 17 G PO PACK
17.0000 g | PACK | Freq: Two times a day (BID) | ORAL | Status: DC
  Administered 2016-05-02 – 2016-05-03 (×3): 17 g via ORAL
  Filled 2016-05-02 (×3): qty 1

## 2016-05-02 MED ORDER — HEPARIN SODIUM (PORCINE) 5000 UNIT/ML IJ SOLN
5000.0000 [IU] | Freq: Three times a day (TID) | INTRAMUSCULAR | Status: DC
  Administered 2016-05-02 – 2016-05-03 (×4): 5000 [IU] via SUBCUTANEOUS
  Filled 2016-05-02 (×4): qty 1

## 2016-05-02 NOTE — Care Management Important Message (Signed)
Important Message  Patient Details  Name: Nichole Munoz MRN: [id number] Date of Birth: Jun 07, 1945   Medicare Important Message Given:  Yes    Nathen May 05/02/2016, 2:34 PM
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
## 2016-05-02 NOTE — Progress Notes (Signed)
PT Cancellation Note  Patient Details Name: Nichole Munoz MRN: [id number] DOB: 1945-01-31   Cancelled Treatment:    Reason Eval/Treat Not Completed: Fatigue/lethargy limiting ability to participate; pt briefly arouses to voice, also  unable to keep her son awake for more than a few seconds to continue attempts and to translate; attempt again as schedule allows   [facility] 05/02/2016, 10:50 AM

## 2016-05-02 NOTE — Progress Notes (Signed)
Pt on voiding trial, foley removed at 1118, pt has not voided since. Bladder scan indicated 790 ml, MD notified.

## 2016-05-02 NOTE — Anesthesia Postprocedure Evaluation (Addendum)
Anesthesia Post Note  Patient: Nichole Munoz  Procedure(s) Performed: Procedure(s) (LRB): OPEN REDUCTION INTERNAL FIXATION (ORIF) DISTAL RADIAL FRACTURE AND ULNA  FRACTURE (Left)  Patient location during evaluation: PACU Anesthesia Type: General Level of consciousness: awake and alert Pain management: pain level controlled Vital Signs Assessment: post-procedure vital signs reviewed and stable Respiratory status: spontaneous breathing, nonlabored ventilation, respiratory function stable and patient connected to nasal cannula oxygen Cardiovascular status: blood pressure returned to baseline and stable Postop Assessment: no signs of nausea or vomiting Anesthetic complications: no        Last Vitals:  Vitals:   05/01/16 2157 05/02/16 0546  BP: (!) 158/91 135/73  Pulse: (!) 110 (!) 118  Resp: 16 18  Temp: 36.5 C 37.8 C    Last Pain:  Vitals:   05/02/16 0648  TempSrc:   PainSc: Asleep   Pain Goal: Patients Stated Pain Goal: 2 (05/02/16 0546)               Effie Berkshire
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
## 2016-05-02 NOTE — Progress Notes (Signed)
OT Cancellation Note  Patient Details Name: Nichole Munoz MRN: [id number] DOB: Mar 21, 1945   Cancelled Treatment:    Reason Eval/Treat Not Completed: Patient declined, no reason specified. Son reports pt attempting to eat breakfast and currently in a lot of pain; requesting OT check back this afternoon. Will follow up as time allows.  Binnie Kand M.S., OTR/L Pager: [phone]  05/02/2016, 9:12 AM

## 2016-05-03 DIAGNOSIS — S12600D Unspecified displaced fracture of seventh cervical vertebra, subsequent encounter for fracture with routine healing: Secondary | ICD-10-CM

## 2016-05-03 DIAGNOSIS — S62102D Fracture of unspecified carpal bone, left wrist, subsequent encounter for fracture with routine healing: Secondary | ICD-10-CM

## 2016-05-03 LAB — GLUCOSE, CAPILLARY
GLUCOSE-CAPILLARY: 141 mg/dL — AB (ref 65–99)
Glucose-Capillary: 313 mg/dL — ABNORMAL HIGH (ref 65–99)

## 2016-05-03 MED ORDER — ACETAMINOPHEN 325 MG PO TABS: 650.0000 mg | ORAL_TABLET | Freq: Four times a day (QID) | ORAL | 0 refills | Status: AC

## 2016-05-03 MED ORDER — TAMSULOSIN HCL 0.4 MG PO CAPS: 0.4000 mg | ORAL_CAPSULE | Freq: Every day | ORAL | 0 refills | Status: AC

## 2016-05-03 MED ORDER — POLYETHYLENE GLYCOL 3350 17 G PO PACK: 17.0000 g | PACK | Freq: Every day | ORAL | 0 refills | Status: AC

## 2016-05-03 MED ORDER — OXYCODONE HCL 5 MG PO TABS
5.0000 mg | ORAL_TABLET | Freq: Four times a day (QID) | ORAL | 0 refills | Status: AC | PRN
Start: 2016-05-03 — End: 2016-05-10

## 2016-05-03 MED ORDER — SENNOSIDES-DOCUSATE SODIUM 8.6-50 MG PO TABS: 1.0000 | ORAL_TABLET | Freq: Every evening | ORAL | 0 refills | Status: DC | PRN

## 2016-05-03 MED ORDER — INSULIN GLARGINE 100 UNIT/ML SOLOSTAR PEN: 15.0000 [IU] | PEN_INJECTOR | Freq: Two times a day (BID) | SUBCUTANEOUS | 1 refills | Status: DC

## 2016-05-03 NOTE — Care Management Note (Signed)
Case Management Note  Patient Details  Name: Nichole Munoz MRN: [id number] Date of Birth: 1945/02/08  Subjective/Objective:                 Patient with order to DC to home with [facility] and shower stool. Spoke to son, would like to use [facility] again, referral made for DME and HH.    Action/Plan:  DC to home w [place] and DME.  Expected Discharge Date:  05/03/16               Expected Discharge Plan:  [place]  In-House Referral:     Discharge planning Services  CM Consult  Post Acute Care Choice:  Home Health, Durable Medical Equipment Choice offered to:     DME Arranged:  Shower stool DME Agency:  [place]:  RN, PT, OT, Nurse's Aide [facility] Agency:  [place]  Status of Service:  Completed, signed off  If discussed at [place] of Stay Meetings, dates discussed:    Additional Comments:  Carles Collet, RN 05/03/2016, 2:15 PM

## 2016-05-03 NOTE — Progress Notes (Signed)
Pt unable to urinate, bladder scan indicated 668 ml, In and Out cath completed at bedside by Henry Russel, RN and Odessa Fleming, RN.  Pt output 800, procedure tolerated well.
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
## 2016-05-03 NOTE — Progress Notes (Addendum)
In and out catheterization completed as ordered for urinary retention. Procedure performed by Lorenso Courier, RN and Anderson Malta NT. Pt tolerated procedure well, 1000 ml of clear yellow urine collected.

## 2016-05-03 NOTE — Progress Notes (Signed)
Nichole Munoz to be D/C'd  per MD order.  Discussed with the patient & Son who is the interpretor and all questions fully answered.  VSS, Skin clean, dry and intact without evidence of skin break down, no evidence of skin tears noted. IV catheter discontinued intact. Site without signs and symptoms of complications. Dressing and pressure applied.  An After Visit Summary was printed and given to the patient. Patient received prescription.  D/c education completed with patient/family including follow up instructions, medication list, d/c activities limitations if indicated, with other d/c instructions as indicated by MD - patient able to verbalize understanding, all questions fully answered.   Patient instructed to return to ED, call 911, or call MD for any changes in condition.   Patient escorted via [place], and D/C home via private auto.  Milas Hock 05/03/2016 4:26 PM

## 2016-05-05 ENCOUNTER — Encounter (HOSPITAL_COMMUNITY): Payer: Self-pay | Admitting: Orthopedic Surgery

## 2016-05-15 ENCOUNTER — Encounter: Payer: Self-pay | Admitting: *Deleted

## 2016-05-15 ENCOUNTER — Ambulatory Visit: Payer: No Typology Code available for payment source | Attending: Orthopedic Surgery | Admitting: *Deleted

## 2016-05-15 DIAGNOSIS — R278 Other lack of coordination: Secondary | ICD-10-CM | POA: Insufficient documentation

## 2016-05-15 DIAGNOSIS — R600 Localized edema: Secondary | ICD-10-CM

## 2016-05-15 DIAGNOSIS — M6281 Muscle weakness (generalized): Secondary | ICD-10-CM | POA: Diagnosis present

## 2016-05-15 DIAGNOSIS — M25532 Pain in left wrist: Secondary | ICD-10-CM | POA: Insufficient documentation

## 2016-05-15 DIAGNOSIS — M79602 Pain in left arm: Secondary | ICD-10-CM | POA: Insufficient documentation
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
## 2016-05-15 NOTE — Patient Instructions (Addendum)
WEARING SCHEDULE:  Wear splint at ALL times. Do not remove splint. Call if it needs to be adjusted. It is positioned this way for proper healing to the bones.  PURPOSE:  To prevent movement and for protection until your injury/broken bones can heal  CARE OF SPLINT:  Keep splint away from heat sources including: stove, radiator or furnace, or a car in sunlight. The splint can melt and will no longer fit you properly  Keep away from pets and children  Clean the splint with rubbing alcohol 1-2 times per day.   Then dry hand/arm completely before replacing splint. (When cleaning hand/arm, keep it immobilized in same position until splint is replaced)  PRECAUTIONS/POTENTIAL PROBLEMS: *If you notice or experience increased pain, swelling, numbness, or a lingering reddened area from the splint: Contact your therapist immediately by calling [phone]. You must wear the splint for protection, but we will get you scheduled for adjustments as quickly as possible.  (If only straps or hooks need to be replaced and NO adjustments to the splint need to be made, just call the office ahead and let them know you are coming in)  If you have any medical concerns or signs of infection, please call your doctor immediately

## 2016-05-22 ENCOUNTER — Ambulatory Visit: Payer: No Typology Code available for payment source | Admitting: Occupational Therapy

## 2016-06-06 NOTE — Addendum Note (Signed)
Addendum  created 06/06/16 1259 by Effie Berkshire, MD   Sign clinical note

## 2016-07-05 ENCOUNTER — Emergency Department (HOSPITAL_COMMUNITY): Payer: Medicare Other

## 2016-07-05 ENCOUNTER — Encounter (HOSPITAL_COMMUNITY): Payer: Self-pay

## 2016-07-05 ENCOUNTER — Emergency Department (HOSPITAL_COMMUNITY)
Admission: EM | Admit: 2016-07-05 | Discharge: 2016-07-05 | Disposition: A | Discharge status: Home / Self Care | Payer: Medicare Other | Attending: Emergency Medicine | Admitting: Emergency Medicine

## 2016-07-05 DIAGNOSIS — Y939 Activity, unspecified: Secondary | ICD-10-CM | POA: Diagnosis not present

## 2016-07-05 DIAGNOSIS — S63502A Unspecified sprain of left wrist, initial encounter: Secondary | ICD-10-CM

## 2016-07-05 DIAGNOSIS — E1140 Type 2 diabetes mellitus with diabetic neuropathy, unspecified: Secondary | ICD-10-CM | POA: Insufficient documentation

## 2016-07-05 DIAGNOSIS — Z79899 Other long term (current) drug therapy: Secondary | ICD-10-CM | POA: Insufficient documentation

## 2016-07-05 DIAGNOSIS — N183 Chronic kidney disease, stage 3 (moderate): Secondary | ICD-10-CM | POA: Insufficient documentation

## 2016-07-05 DIAGNOSIS — Y929 Unspecified place or not applicable: Secondary | ICD-10-CM | POA: Insufficient documentation

## 2016-07-05 DIAGNOSIS — S199XXA Unspecified injury of neck, initial encounter: Secondary | ICD-10-CM | POA: Diagnosis present

## 2016-07-05 DIAGNOSIS — Y999 Unspecified external cause status: Secondary | ICD-10-CM | POA: Diagnosis not present

## 2016-07-05 DIAGNOSIS — E1165 Type 2 diabetes mellitus with hyperglycemia: Secondary | ICD-10-CM

## 2016-07-05 DIAGNOSIS — S161XXA Strain of muscle, fascia and tendon at neck level, initial encounter: Secondary | ICD-10-CM

## 2016-07-05 DIAGNOSIS — Z7982 Long term (current) use of aspirin: Secondary | ICD-10-CM | POA: Insufficient documentation

## 2016-07-05 DIAGNOSIS — J45909 Unspecified asthma, uncomplicated: Secondary | ICD-10-CM | POA: Diagnosis not present

## 2016-07-05 DIAGNOSIS — S39012A Strain of muscle, fascia and tendon of lower back, initial encounter: Secondary | ICD-10-CM | POA: Diagnosis not present

## 2016-07-05 DIAGNOSIS — W19XXXA Unspecified fall, initial encounter: Secondary | ICD-10-CM

## 2016-07-05 DIAGNOSIS — I129 Hypertensive chronic kidney disease with stage 1 through stage 4 chronic kidney disease, or unspecified chronic kidney disease: Secondary | ICD-10-CM | POA: Diagnosis not present

## 2016-07-05 DIAGNOSIS — E1122 Type 2 diabetes mellitus with diabetic chronic kidney disease: Secondary | ICD-10-CM | POA: Insufficient documentation

## 2016-07-05 DIAGNOSIS — E860 Dehydration: Secondary | ICD-10-CM

## 2016-07-05 LAB — COMPREHENSIVE METABOLIC PANEL
ALBUMIN: 3.4 g/dL — AB (ref 3.5–5.0)
ALK PHOS: 76 U/L (ref 38–126)
ALT: 14 U/L (ref 14–54)
ANION GAP: 10 (ref 5–15)
AST: 22 U/L (ref 15–41)
BILIRUBIN TOTAL: 0.4 mg/dL (ref 0.3–1.2)
BUN: 53 mg/dL — AB (ref 6–20)
CALCIUM: 9.3 mg/dL (ref 8.9–10.3)
CO2: 22 mmol/L (ref 22–32)
Chloride: 100 mmol/L — ABNORMAL LOW (ref 101–111)
Creatinine, Ser: 1.56 mg/dL — ABNORMAL HIGH (ref 0.44–1.00)
GFR calc Af Amer: 37 mL/min — ABNORMAL LOW (ref >60)
GFR calc non Af Amer: 32 mL/min — ABNORMAL LOW (ref >60)
GLUCOSE: 546 mg/dL — AB (ref 65–99)
Potassium: 5.5 mmol/L — ABNORMAL HIGH (ref 3.5–5.1)
Sodium: 132 mmol/L — ABNORMAL LOW (ref 135–145)
TOTAL PROTEIN: 6.9 g/dL (ref 6.5–8.1)

## 2016-07-05 LAB — CBC WITH DIFFERENTIAL/PLATELET
BASOS PCT: 0 %
Basophils Absolute: 0 10*3/uL (ref 0.0–0.1)
Eosinophils Absolute: 0 10*3/uL (ref 0.0–0.7)
Eosinophils Relative: 0 %
HEMATOCRIT: 36.6 % (ref 36.0–46.0)
HEMOGLOBIN: 12.8 g/dL (ref 12.0–15.0)
LYMPHS ABS: 1.9 10*3/uL (ref 0.7–4.0)
LYMPHS PCT: 15 %
MCH: 29.6 pg (ref 26.0–34.0)
MCHC: 35 g/dL (ref 30.0–36.0)
MCV: 84.5 fL (ref 78.0–100.0)
MONOS PCT: 3 %
Monocytes Absolute: 0.3 10*3/uL (ref 0.1–1.0)
NEUTROS ABS: 10.6 10*3/uL — AB (ref 1.7–7.7)
NEUTROS PCT: 82 %
Platelets: 197 10*3/uL (ref 150–400)
RBC: 4.33 MIL/uL (ref 3.87–5.11)
RDW: 12.3 % (ref 11.5–15.5)
WBC: 12.8 10*3/uL — ABNORMAL HIGH (ref 4.0–10.5)

## 2016-07-05 LAB — URINALYSIS, ROUTINE W REFLEX MICROSCOPIC
Bilirubin Urine: NEGATIVE
Glucose, UA: >=500 mg/dL — AB
Hgb urine dipstick: NEGATIVE
Ketones, ur: NEGATIVE mg/dL
NITRITE: NEGATIVE
PH: 5 (ref 5.0–8.0)
Protein, ur: NEGATIVE mg/dL
SPECIFIC GRAVITY, URINE: 1.018 (ref 1.005–1.030)

## 2016-07-05 LAB — CBG MONITORING, ED
GLUCOSE-CAPILLARY: 228 mg/dL — AB (ref 65–99)
Glucose-Capillary: 589 mg/dL (ref 65–99)
Glucose-Capillary: 318 mg/dL — ABNORMAL HIGH (ref 65–99)

## 2016-07-05 LAB — TROPONIN I: Troponin I: 0.03 ng/mL (ref <0.03)

## 2016-07-05 MED ORDER — HYDROCODONE-ACETAMINOPHEN 5-325 MG PO TABS: 1.0000 | ORAL_TABLET | ORAL | 0 refills | Status: DC | PRN

## 2016-07-05 MED ORDER — SODIUM CHLORIDE 0.9 % IV SOLN
INTRAVENOUS | Status: DC
  Administered 2016-07-05: 17:00:00 via INTRAVENOUS

## 2016-07-05 MED ORDER — SODIUM CHLORIDE 0.9 % IV BOLUS (SEPSIS)
1000.0000 mL | Freq: Once | INTRAVENOUS | Status: AC
  Administered 2016-07-05: 1000 mL via INTRAVENOUS

## 2016-07-05 MED ORDER — INSULIN ASPART 100 UNIT/ML ~~LOC~~ SOLN
10.0000 [IU] | Freq: Once | SUBCUTANEOUS | Status: AC
  Administered 2016-07-05: 10 [IU] via INTRAVENOUS
  Filled 2016-07-05: qty 1

## 2016-07-05 NOTE — ED Notes (Signed)
Pt in Xray.  Will get ortho statics when pt returns to rooms

## 2016-07-05 NOTE — Discharge Instructions (Signed)
Increase Lantus from 26 units to 30 units twice a day.  Call you doctor on Monday for further diabetic medication instructions.
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
## 2016-07-05 NOTE — ED Notes (Signed)
Pt walked with one hand assist steady gait.

## 2016-07-05 NOTE — ED Notes (Signed)
Bed: HD39 Expected date: 07/05/16 Expected time: 2:24 PM Means of arrival: Ambulance Comments: Fall, hyperglycemia

## 2016-07-05 NOTE — ED Notes (Signed)
Patient transported to X-ray 
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
## 2016-07-18 ENCOUNTER — Encounter (HOSPITAL_COMMUNITY): Payer: Self-pay | Admitting: Internal Medicine

## 2016-07-18 ENCOUNTER — Inpatient Hospital Stay (HOSPITAL_COMMUNITY)
Admission: EM | Admit: 2016-07-18 | Discharge: 2016-07-21 | DRG: 872 | Disposition: A | Discharge status: Home / Self Care | Payer: Medicare Other | Attending: Internal Medicine | Admitting: Internal Medicine

## 2016-07-18 ENCOUNTER — Emergency Department (HOSPITAL_COMMUNITY): Payer: Medicare Other

## 2016-07-18 DIAGNOSIS — E1165 Type 2 diabetes mellitus with hyperglycemia: Secondary | ICD-10-CM

## 2016-07-18 DIAGNOSIS — N390 Urinary tract infection, site not specified: Secondary | ICD-10-CM | POA: Diagnosis present

## 2016-07-18 DIAGNOSIS — I129 Hypertensive chronic kidney disease with stage 1 through stage 4 chronic kidney disease, or unspecified chronic kidney disease: Secondary | ICD-10-CM | POA: Diagnosis present

## 2016-07-18 DIAGNOSIS — S12600D Unspecified displaced fracture of seventh cervical vertebra, subsequent encounter for fracture with routine healing: Secondary | ICD-10-CM

## 2016-07-18 DIAGNOSIS — Z794 Long term (current) use of insulin: Secondary | ICD-10-CM

## 2016-07-18 DIAGNOSIS — E1122 Type 2 diabetes mellitus with diabetic chronic kidney disease: Secondary | ICD-10-CM | POA: Diagnosis present

## 2016-07-18 DIAGNOSIS — E1169 Type 2 diabetes mellitus with other specified complication: Secondary | ICD-10-CM | POA: Diagnosis present

## 2016-07-18 DIAGNOSIS — Z221 Carrier of other intestinal infectious diseases: Secondary | ICD-10-CM

## 2016-07-18 DIAGNOSIS — I10 Essential (primary) hypertension: Secondary | ICD-10-CM | POA: Diagnosis present

## 2016-07-18 DIAGNOSIS — K5000 Crohn's disease of small intestine without complications: Secondary | ICD-10-CM

## 2016-07-18 DIAGNOSIS — R112 Nausea with vomiting, unspecified: Secondary | ICD-10-CM | POA: Diagnosis present

## 2016-07-18 DIAGNOSIS — A419 Sepsis, unspecified organism: Secondary | ICD-10-CM | POA: Diagnosis not present

## 2016-07-18 DIAGNOSIS — S52602D Unspecified fracture of lower end of left ulna, subsequent encounter for closed fracture with routine healing: Secondary | ICD-10-CM

## 2016-07-18 DIAGNOSIS — R7989 Other specified abnormal findings of blood chemistry: Secondary | ICD-10-CM | POA: Diagnosis present

## 2016-07-18 DIAGNOSIS — E119 Type 2 diabetes mellitus without complications: Secondary | ICD-10-CM | POA: Diagnosis present

## 2016-07-18 DIAGNOSIS — N183 Chronic kidney disease, stage 3 unspecified: Secondary | ICD-10-CM | POA: Diagnosis present

## 2016-07-18 DIAGNOSIS — R42 Dizziness and giddiness: Secondary | ICD-10-CM | POA: Diagnosis not present

## 2016-07-18 DIAGNOSIS — M549 Dorsalgia, unspecified: Secondary | ICD-10-CM

## 2016-07-18 DIAGNOSIS — Z7982 Long term (current) use of aspirin: Secondary | ICD-10-CM

## 2016-07-18 DIAGNOSIS — E785 Hyperlipidemia, unspecified: Secondary | ICD-10-CM | POA: Diagnosis present

## 2016-07-18 DIAGNOSIS — Z79899 Other long term (current) drug therapy: Secondary | ICD-10-CM

## 2016-07-18 DIAGNOSIS — E1129 Type 2 diabetes mellitus with other diabetic kidney complication: Secondary | ICD-10-CM | POA: Diagnosis present

## 2016-07-18 DIAGNOSIS — K649 Unspecified hemorrhoids: Secondary | ICD-10-CM | POA: Diagnosis present

## 2016-07-18 DIAGNOSIS — A044 Other intestinal Escherichia coli infections: Secondary | ICD-10-CM

## 2016-07-18 DIAGNOSIS — S52572D Other intraarticular fracture of lower end of left radius, subsequent encounter for closed fracture with routine healing: Secondary | ICD-10-CM

## 2016-07-18 DIAGNOSIS — R509 Fever, unspecified: Secondary | ICD-10-CM

## 2016-07-18 DIAGNOSIS — J45909 Unspecified asthma, uncomplicated: Secondary | ICD-10-CM | POA: Diagnosis present

## 2016-07-18 DIAGNOSIS — N1831 Chronic kidney disease, stage 3a: Secondary | ICD-10-CM | POA: Diagnosis present

## 2016-07-18 DIAGNOSIS — R945 Abnormal results of liver function studies: Secondary | ICD-10-CM

## 2016-07-18 DIAGNOSIS — E1140 Type 2 diabetes mellitus with diabetic neuropathy, unspecified: Secondary | ICD-10-CM | POA: Diagnosis present

## 2016-07-18 LAB — COMPREHENSIVE METABOLIC PANEL
ALK PHOS: 163 U/L — AB (ref 38–126)
ALT: 181 U/L — AB (ref 14–54)
AST: 285 U/L — ABNORMAL HIGH (ref 15–41)
Albumin: 3.5 g/dL (ref 3.5–5.0)
Anion gap: 13 (ref 5–15)
BILIRUBIN TOTAL: 1 mg/dL (ref 0.3–1.2)
BUN: 23 mg/dL — ABNORMAL HIGH (ref 6–20)
CALCIUM: 8.7 mg/dL — AB (ref 8.9–10.3)
CO2: 23 mmol/L (ref 22–32)
CREATININE: 1.12 mg/dL — AB (ref 0.44–1.00)
Chloride: 97 mmol/L — ABNORMAL LOW (ref 101–111)
GFR, EST AFRICAN AMERICAN: 55 mL/min — AB (ref >60)
GFR, EST NON AFRICAN AMERICAN: 48 mL/min — AB (ref >60)
Glucose, Bld: 258 mg/dL — ABNORMAL HIGH (ref 65–99)
Potassium: 4.3 mmol/L (ref 3.5–5.1)
SODIUM: 133 mmol/L — AB (ref 135–145)
TOTAL PROTEIN: 7.5 g/dL (ref 6.5–8.1)

## 2016-07-18 LAB — CBC
HCT: 37.5 % (ref 36.0–46.0)
Hemoglobin: 12.9 g/dL (ref 12.0–15.0)
MCH: 29.3 pg (ref 26.0–34.0)
MCHC: 34.4 g/dL (ref 30.0–36.0)
MCV: 85 fL (ref 78.0–100.0)
PLATELETS: 166 10*3/uL (ref 150–400)
RBC: 4.41 MIL/uL (ref 3.87–5.11)
RDW: 12.2 % (ref 11.5–15.5)
WBC: 11.1 10*3/uL — ABNORMAL HIGH (ref 4.0–10.5)

## 2016-07-18 LAB — URINALYSIS, ROUTINE W REFLEX MICROSCOPIC
Bilirubin Urine: NEGATIVE
Ketones, ur: NEGATIVE mg/dL
NITRITE: NEGATIVE
Protein, ur: 30 mg/dL — AB
SPECIFIC GRAVITY, URINE: 1.013 (ref 1.005–1.030)
pH: 7 (ref 5.0–8.0)

## 2016-07-18 LAB — LIPASE, BLOOD: Lipase: 36 U/L (ref 11–51)

## 2016-07-18 LAB — I-STAT CG4 LACTIC ACID, ED: Lactic Acid, Venous: 1.65 mmol/L (ref 0.5–1.9)

## 2016-07-18 MED ORDER — ONDANSETRON HCL 4 MG/2ML IJ SOLN
4.0000 mg | Freq: Once | INTRAMUSCULAR | Status: AC | PRN
  Administered 2016-07-18: 4 mg via INTRAVENOUS
  Filled 2016-07-18: qty 2

## 2016-07-18 MED ORDER — SODIUM CHLORIDE 0.9 % IV BOLUS (SEPSIS)
1000.0000 mL | Freq: Once | INTRAVENOUS | Status: AC
  Administered 2016-07-18: 1000 mL via INTRAVENOUS

## 2016-07-18 MED ORDER — SODIUM CHLORIDE 0.9 % IV BOLUS (SEPSIS)
250.0000 mL | Freq: Once | INTRAVENOUS | Status: AC
  Administered 2016-07-18: 250 mL via INTRAVENOUS

## 2016-07-18 NOTE — ED Notes (Signed)
Nurse drawing labs. 
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
## 2016-07-18 NOTE — ED Triage Notes (Signed)
Per [facility]: Pt to ED from home c/o sudden onset N/V at 1500 today. Pt's family member reports she may have had some bad fruit around noon. Pt endorses lightheadedness along with episodes of N/V - reports "too many to count." Pt denies abd pain, fevers/chills. 18g. RAC, given 4mg  zofran ODT en route - pt still nauseated. EMS VS: 168/77, P 112 ST on monitor, 96% RA, R 16, CBG 270. Patient A&O x 4, denies dizziness at this time.

## 2016-07-18 NOTE — ED Notes (Signed)
Patient transported to US 

## 2016-07-19 ENCOUNTER — Encounter (HOSPITAL_COMMUNITY): Payer: Self-pay | Admitting: Internal Medicine

## 2016-07-19 ENCOUNTER — Observation Stay (HOSPITAL_COMMUNITY): Payer: Medicare Other

## 2016-07-19 DIAGNOSIS — I129 Hypertensive chronic kidney disease with stage 1 through stage 4 chronic kidney disease, or unspecified chronic kidney disease: Secondary | ICD-10-CM | POA: Diagnosis present

## 2016-07-19 DIAGNOSIS — E1165 Type 2 diabetes mellitus with hyperglycemia: Secondary | ICD-10-CM | POA: Diagnosis not present

## 2016-07-19 DIAGNOSIS — K50019 Crohn's disease of small intestine with unspecified complications: Secondary | ICD-10-CM | POA: Diagnosis not present

## 2016-07-19 DIAGNOSIS — Z794 Long term (current) use of insulin: Secondary | ICD-10-CM | POA: Diagnosis not present

## 2016-07-19 DIAGNOSIS — A0472 Enterocolitis due to Clostridium difficile, not specified as recurrent: Secondary | ICD-10-CM | POA: Diagnosis not present

## 2016-07-19 DIAGNOSIS — A044 Other intestinal Escherichia coli infections: Secondary | ICD-10-CM | POA: Diagnosis present

## 2016-07-19 DIAGNOSIS — S12600D Unspecified displaced fracture of seventh cervical vertebra, subsequent encounter for fracture with routine healing: Secondary | ICD-10-CM | POA: Diagnosis not present

## 2016-07-19 DIAGNOSIS — N183 Chronic kidney disease, stage 3 (moderate): Secondary | ICD-10-CM | POA: Diagnosis present

## 2016-07-19 DIAGNOSIS — Z221 Carrier of other intestinal infectious diseases: Secondary | ICD-10-CM | POA: Diagnosis not present

## 2016-07-19 DIAGNOSIS — S52602D Unspecified fracture of lower end of left ulna, subsequent encounter for closed fracture with routine healing: Secondary | ICD-10-CM | POA: Diagnosis not present

## 2016-07-19 DIAGNOSIS — E1149 Type 2 diabetes mellitus with other diabetic neurological complication: Secondary | ICD-10-CM | POA: Diagnosis not present

## 2016-07-19 DIAGNOSIS — R112 Nausea with vomiting, unspecified: Secondary | ICD-10-CM | POA: Diagnosis present

## 2016-07-19 DIAGNOSIS — Z7982 Long term (current) use of aspirin: Secondary | ICD-10-CM | POA: Diagnosis not present

## 2016-07-19 DIAGNOSIS — Z79899 Other long term (current) drug therapy: Secondary | ICD-10-CM | POA: Diagnosis not present

## 2016-07-19 DIAGNOSIS — R42 Dizziness and giddiness: Secondary | ICD-10-CM | POA: Diagnosis present

## 2016-07-19 DIAGNOSIS — R7989 Other specified abnormal findings of blood chemistry: Secondary | ICD-10-CM | POA: Diagnosis not present

## 2016-07-19 DIAGNOSIS — E1140 Type 2 diabetes mellitus with diabetic neuropathy, unspecified: Secondary | ICD-10-CM | POA: Diagnosis present

## 2016-07-19 DIAGNOSIS — A419 Sepsis, unspecified organism: Secondary | ICD-10-CM | POA: Diagnosis present

## 2016-07-19 DIAGNOSIS — K649 Unspecified hemorrhoids: Secondary | ICD-10-CM | POA: Diagnosis present

## 2016-07-19 DIAGNOSIS — E1121 Type 2 diabetes mellitus with diabetic nephropathy: Secondary | ICD-10-CM | POA: Diagnosis not present

## 2016-07-19 DIAGNOSIS — N390 Urinary tract infection, site not specified: Secondary | ICD-10-CM | POA: Diagnosis present

## 2016-07-19 DIAGNOSIS — A058 Other specified bacterial foodborne intoxications: Secondary | ICD-10-CM | POA: Diagnosis not present

## 2016-07-19 DIAGNOSIS — J45909 Unspecified asthma, uncomplicated: Secondary | ICD-10-CM | POA: Diagnosis present

## 2016-07-19 DIAGNOSIS — E1122 Type 2 diabetes mellitus with diabetic chronic kidney disease: Secondary | ICD-10-CM | POA: Diagnosis present

## 2016-07-19 DIAGNOSIS — S52572D Other intraarticular fracture of lower end of left radius, subsequent encounter for closed fracture with routine healing: Secondary | ICD-10-CM | POA: Diagnosis not present

## 2016-07-19 LAB — HEPATIC FUNCTION PANEL
ALT: 146 U/L — ABNORMAL HIGH (ref 14–54)
AST: 173 U/L — AB (ref 15–41)
Albumin: 3 g/dL — ABNORMAL LOW (ref 3.5–5.0)
Alkaline Phosphatase: 148 U/L — ABNORMAL HIGH (ref 38–126)
BILIRUBIN DIRECT: 0.4 mg/dL (ref 0.1–0.5)
BILIRUBIN INDIRECT: 1.3 mg/dL — AB (ref 0.3–0.9)
BILIRUBIN TOTAL: 1.7 mg/dL — AB (ref 0.3–1.2)
Total Protein: 6.3 g/dL — ABNORMAL LOW (ref 6.5–8.1)

## 2016-07-19 LAB — CBC
HEMATOCRIT: 35.8 % — AB (ref 36.0–46.0)
Hemoglobin: 12 g/dL (ref 12.0–15.0)
MCH: 28.6 pg (ref 26.0–34.0)
MCHC: 33.5 g/dL (ref 30.0–36.0)
MCV: 85.2 fL (ref 78.0–100.0)
PLATELETS: 149 10*3/uL — AB (ref 150–400)
RBC: 4.2 MIL/uL (ref 3.87–5.11)
RDW: 12.3 % (ref 11.5–15.5)
WBC: 7.7 10*3/uL (ref 4.0–10.5)

## 2016-07-19 LAB — C DIFFICILE QUICK SCREEN W PCR REFLEX
C Diff antigen: POSITIVE — AB
C Diff toxin: NEGATIVE

## 2016-07-19 LAB — GLUCOSE, CAPILLARY
GLUCOSE-CAPILLARY: 174 mg/dL — AB (ref 65–99)
Glucose-Capillary: 185 mg/dL — ABNORMAL HIGH (ref 65–99)
Glucose-Capillary: 313 mg/dL — ABNORMAL HIGH (ref 65–99)
Glucose-Capillary: 246 mg/dL — ABNORMAL HIGH (ref 65–99)
Glucose-Capillary: 298 mg/dL — ABNORMAL HIGH (ref 65–99)

## 2016-07-19 LAB — BASIC METABOLIC PANEL
Anion gap: 10 (ref 5–15)
BUN: 17 mg/dL (ref 6–20)
CHLORIDE: 103 mmol/L (ref 101–111)
CO2: 24 mmol/L (ref 22–32)
CREATININE: 1.2 mg/dL — AB (ref 0.44–1.00)
Calcium: 8.3 mg/dL — ABNORMAL LOW (ref 8.9–10.3)
GFR calc Af Amer: 51 mL/min — ABNORMAL LOW (ref >60)
GFR calc non Af Amer: 44 mL/min — ABNORMAL LOW (ref >60)
GLUCOSE: 166 mg/dL — AB (ref 65–99)
POTASSIUM: 3.6 mmol/L (ref 3.5–5.1)
SODIUM: 137 mmol/L (ref 135–145)

## 2016-07-19 LAB — COMPREHENSIVE METABOLIC PANEL
ALBUMIN: 2.8 g/dL — AB (ref 3.5–5.0)
ALK PHOS: 138 U/L — AB (ref 38–126)
ALT: 120 U/L — ABNORMAL HIGH (ref 14–54)
ANION GAP: 9 (ref 5–15)
AST: 111 U/L — AB (ref 15–41)
BUN: 16 mg/dL (ref 6–20)
CALCIUM: 8.1 mg/dL — AB (ref 8.9–10.3)
CO2: 22 mmol/L (ref 22–32)
Chloride: 105 mmol/L (ref 101–111)
Creatinine, Ser: 1.71 mg/dL — ABNORMAL HIGH (ref 0.44–1.00)
GFR calc Af Amer: 33 mL/min — ABNORMAL LOW (ref >60)
GFR calc non Af Amer: 29 mL/min — ABNORMAL LOW (ref >60)
GLUCOSE: 328 mg/dL — AB (ref 65–99)
Potassium: 3.5 mmol/L (ref 3.5–5.1)
SODIUM: 136 mmol/L (ref 135–145)
Total Bilirubin: 1.4 mg/dL — ABNORMAL HIGH (ref 0.3–1.2)
Total Protein: 6.1 g/dL — ABNORMAL LOW (ref 6.5–8.1)

## 2016-07-19 LAB — ACETAMINOPHEN LEVEL: Acetaminophen (Tylenol), Serum: <10 ug/mL — ABNORMAL LOW (ref 10–30)

## 2016-07-19 LAB — PROTIME-INR
INR: 0.97
PROTHROMBIN TIME: 12.9 s (ref 11.4–15.2)

## 2016-07-19 LAB — CLOSTRIDIUM DIFFICILE BY PCR: Toxigenic C. Difficile by PCR: NEGATIVE

## 2016-07-19 LAB — LACTIC ACID, PLASMA: LACTIC ACID, VENOUS: 1.5 mmol/L (ref 0.5–1.9)

## 2016-07-19 MED ORDER — HYDRALAZINE HCL 20 MG/ML IJ SOLN
10.0000 mg | INTRAMUSCULAR | Status: DC | PRN
  Administered 2016-07-21: 10 mg via INTRAVENOUS
  Filled 2016-07-19: qty 1

## 2016-07-19 MED ORDER — GABAPENTIN 300 MG PO CAPS
300.0000 mg | ORAL_CAPSULE | Freq: Two times a day (BID) | ORAL | Status: DC
  Administered 2016-07-19 – 2016-07-21 (×5): 300 mg via ORAL
  Filled 2016-07-19 (×5): qty 1

## 2016-07-19 MED ORDER — ACETAMINOPHEN 325 MG PO TABS
650.0000 mg | ORAL_TABLET | Freq: Four times a day (QID) | ORAL | Status: DC | PRN
  Administered 2016-07-21: 650 mg via ORAL
  Filled 2016-07-19: qty 2

## 2016-07-19 MED ORDER — ONDANSETRON HCL 4 MG/2ML IJ SOLN: 4.0000 mg | Freq: Four times a day (QID) | INTRAMUSCULAR | Status: DC | PRN

## 2016-07-19 MED ORDER — ALBUTEROL SULFATE (2.5 MG/3ML) 0.083% IN NEBU: 3.0000 mL | INHALATION_SOLUTION | Freq: Four times a day (QID) | RESPIRATORY_TRACT | Status: DC | PRN

## 2016-07-19 MED ORDER — INSULIN ASPART 100 UNIT/ML ~~LOC~~ SOLN
0.0000 [IU] | SUBCUTANEOUS | Status: DC
  Administered 2016-07-19 (×2): 2 [IU] via SUBCUTANEOUS

## 2016-07-19 MED ORDER — ORAL CARE MOUTH RINSE
15.0000 mL | Freq: Two times a day (BID) | OROMUCOSAL | Status: DC
  Administered 2016-07-19 – 2016-07-21 (×6): 15 mL via OROMUCOSAL

## 2016-07-19 MED ORDER — BOOST / RESOURCE BREEZE PO LIQD
1.0000 | Freq: Three times a day (TID) | ORAL | Status: DC
  Administered 2016-07-19 – 2016-07-21 (×7): 1 via ORAL

## 2016-07-19 MED ORDER — INSULIN GLARGINE 100 UNIT/ML ~~LOC~~ SOLN
20.0000 [IU] | Freq: Two times a day (BID) | SUBCUTANEOUS | Status: DC
  Administered 2016-07-19 – 2016-07-20 (×3): 20 [IU] via SUBCUTANEOUS
  Filled 2016-07-19 (×3): qty 0.2

## 2016-07-19 MED ORDER — PIPERACILLIN-TAZOBACTAM 3.375 G IVPB 30 MIN
3.3750 g | Freq: Once | INTRAVENOUS | Status: AC
  Administered 2016-07-19: 3.375 g via INTRAVENOUS
  Filled 2016-07-19: qty 50

## 2016-07-19 MED ORDER — INSULIN ASPART 100 UNIT/ML ~~LOC~~ SOLN
0.0000 [IU] | Freq: Three times a day (TID) | SUBCUTANEOUS | Status: DC
  Administered 2016-07-19: 3 [IU] via SUBCUTANEOUS
  Administered 2016-07-19: 7 [IU] via SUBCUTANEOUS
  Administered 2016-07-20: 9 [IU] via SUBCUTANEOUS
  Administered 2016-07-20: 3 [IU] via SUBCUTANEOUS
  Administered 2016-07-20: 7 [IU] via SUBCUTANEOUS
  Administered 2016-07-21: 9 [IU] via SUBCUTANEOUS
  Administered 2016-07-21: 2 [IU] via SUBCUTANEOUS

## 2016-07-19 MED ORDER — METRONIDAZOLE IN NACL 5-0.79 MG/ML-% IV SOLN
500.0000 mg | Freq: Three times a day (TID) | INTRAVENOUS | Status: DC
  Administered 2016-07-19 – 2016-07-21 (×7): 500 mg via INTRAVENOUS
  Filled 2016-07-19 (×7): qty 100

## 2016-07-19 MED ORDER — PIPERACILLIN-TAZOBACTAM 3.375 G IVPB
3.3750 g | Freq: Three times a day (TID) | INTRAVENOUS | Status: DC
  Administered 2016-07-19: 3.375 g via INTRAVENOUS
  Filled 2016-07-19 (×2): qty 50

## 2016-07-19 MED ORDER — CIPROFLOXACIN IN D5W 400 MG/200ML IV SOLN
400.0000 mg | Freq: Two times a day (BID) | INTRAVENOUS | Status: DC
  Administered 2016-07-19 – 2016-07-21 (×5): 400 mg via INTRAVENOUS
  Filled 2016-07-19 (×5): qty 200

## 2016-07-19 MED ORDER — ACETAMINOPHEN 650 MG RE SUPP: 650.0000 mg | Freq: Four times a day (QID) | RECTAL | Status: DC | PRN

## 2016-07-19 MED ORDER — ONDANSETRON HCL 4 MG PO TABS: 4.0000 mg | ORAL_TABLET | Freq: Four times a day (QID) | ORAL | Status: DC | PRN

## 2016-07-19 MED ORDER — SODIUM CHLORIDE 0.9 % IV SOLN
INTRAVENOUS | Status: AC
  Administered 2016-07-19: 03:00:00 via INTRAVENOUS
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
## 2016-07-19 NOTE — ED Notes (Signed)
Patient transported to CT 
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
## 2016-07-19 NOTE — Progress Notes (Signed)
Pharmacy Antibiotic Note  Nichole Munoz is a 71 y.o. female admitted on 07/18/2016 with intra-abdominal infection.  Pharmacy has been consulted for Zosyn dosing.  Tmax of 100.5, WBC 11.1. SCr 1.12, CrCl ~2ml/min  Plan: Start Zosyn 3.375 gm IV q8h (4 hour infusion) Monitor clinical picture, renal function F/U C&S, abx deescalation / LOT   Temp (24hrs), Avg:100.5 F (38.1 C), Min:100.5 F (38.1 C), Max:100.5 F (38.1 C)   Recent Labs Lab 07/18/16 1907 07/18/16 2224  WBC 11.1*  --   CREATININE 1.12*  --   LATICACIDVEN  --  1.65    Estimated Creatinine Clearance: 40.1 mL/min (A) (by C-G formula based on SCr of 1.12 mg/dL (H)).    Allergies  Allergen Reactions  . Other Other (See Comments)    Chicken causes itching/  clorox, Bleach causes shortness of breath  . No Known Allergies     Previous listing of unable to tolerate SMELL of Clorox and Chicken REACTION NOT AN ALLERGY    Thank you for allowing pharmacy to be a part of this patient's care.  Elenor Quinones, PharmD, BCPS Clinical Pharmacist Pager [phone] 07/19/2016 12:33 AM

## 2016-07-20 DIAGNOSIS — A0472 Enterocolitis due to Clostridium difficile, not specified as recurrent: Secondary | ICD-10-CM

## 2016-07-20 LAB — COMPREHENSIVE METABOLIC PANEL
ALBUMIN: 2.9 g/dL — AB (ref 3.5–5.0)
ALBUMIN: 3.5 g/dL (ref 3.5–5.0)
ALK PHOS: 158 U/L — AB (ref 38–126)
ALT: 103 U/L — AB (ref 14–54)
ALT: 103 U/L — ABNORMAL HIGH (ref 14–54)
AST: 67 U/L — AB (ref 15–41)
AST: 54 U/L — AB (ref 15–41)
Alkaline Phosphatase: 136 U/L — ABNORMAL HIGH (ref 38–126)
Anion gap: 7 (ref 5–15)
Anion gap: 10 (ref 5–15)
BILIRUBIN TOTAL: 0.8 mg/dL (ref 0.3–1.2)
BILIRUBIN TOTAL: 0.6 mg/dL (ref 0.3–1.2)
BUN: 16 mg/dL (ref 6–20)
BUN: 13 mg/dL (ref 6–20)
CALCIUM: 9.1 mg/dL (ref 8.9–10.3)
CO2: 24 mmol/L (ref 22–32)
CO2: 24 mmol/L (ref 22–32)
Calcium: 8.4 mg/dL — ABNORMAL LOW (ref 8.9–10.3)
Chloride: 107 mmol/L (ref 101–111)
Chloride: 105 mmol/L (ref 101–111)
Creatinine, Ser: 1.27 mg/dL — ABNORMAL HIGH (ref 0.44–1.00)
Creatinine, Ser: 1.2 mg/dL — ABNORMAL HIGH (ref 0.44–1.00)
GFR calc Af Amer: 48 mL/min — ABNORMAL LOW (ref >60)
GFR calc Af Amer: 51 mL/min — ABNORMAL LOW (ref >60)
GFR calc non Af Amer: 41 mL/min — ABNORMAL LOW (ref >60)
GFR calc non Af Amer: 44 mL/min — ABNORMAL LOW (ref >60)
GLUCOSE: 274 mg/dL — AB (ref 65–99)
GLUCOSE: 296 mg/dL — AB (ref 65–99)
POTASSIUM: 3.9 mmol/L (ref 3.5–5.1)
Potassium: 3.8 mmol/L (ref 3.5–5.1)
Sodium: 138 mmol/L (ref 135–145)
Sodium: 139 mmol/L (ref 135–145)
TOTAL PROTEIN: 6.2 g/dL — AB (ref 6.5–8.1)
TOTAL PROTEIN: 7.4 g/dL (ref 6.5–8.1)

## 2016-07-20 LAB — GASTROINTESTINAL PANEL BY PCR, STOOL (REPLACES STOOL CULTURE)
Adenovirus F40/41: NOT DETECTED
Astrovirus: NOT DETECTED
CRYPTOSPORIDIUM: NOT DETECTED
Campylobacter species: NOT DETECTED
Cyclospora cayetanensis: NOT DETECTED
ENTEROAGGREGATIVE E COLI (EAEC): NOT DETECTED
Entamoeba histolytica: NOT DETECTED
Enteropathogenic E coli (EPEC): DETECTED — AB
Enterotoxigenic E coli (ETEC): DETECTED — AB
GIARDIA LAMBLIA: NOT DETECTED
Norovirus GI/GII: NOT DETECTED
Plesimonas shigelloides: NOT DETECTED
ROTAVIRUS A: NOT DETECTED
SALMONELLA SPECIES: NOT DETECTED
SHIGELLA/ENTEROINVASIVE E COLI (EIEC): NOT DETECTED
Sapovirus (I, II, IV, and V): NOT DETECTED
Shiga like toxin producing E coli (STEC): NOT DETECTED
Vibrio cholerae: NOT DETECTED
Vibrio species: NOT DETECTED
YERSINIA ENTEROCOLITICA: NOT DETECTED

## 2016-07-20 LAB — GLUCOSE, CAPILLARY
Glucose-Capillary: 203 mg/dL — ABNORMAL HIGH (ref 65–99)
Glucose-Capillary: 350 mg/dL — ABNORMAL HIGH (ref 65–99)
Glucose-Capillary: 351 mg/dL — ABNORMAL HIGH (ref 65–99)
Glucose-Capillary: 128 mg/dL — ABNORMAL HIGH (ref 65–99)

## 2016-07-20 LAB — CBC
HCT: 34.4 % — ABNORMAL LOW (ref 36.0–46.0)
Hemoglobin: 11.5 g/dL — ABNORMAL LOW (ref 12.0–15.0)
MCH: 28.5 pg (ref 26.0–34.0)
MCHC: 33.4 g/dL (ref 30.0–36.0)
MCV: 85.4 fL (ref 78.0–100.0)
PLATELETS: 163 10*3/uL (ref 150–400)
RBC: 4.03 MIL/uL (ref 3.87–5.11)
RDW: 12.4 % (ref 11.5–15.5)
WBC: 6.7 10*3/uL (ref 4.0–10.5)

## 2016-07-20 MED ORDER — SODIUM CHLORIDE 0.9 % IV SOLN: 250.0000 mL | INTRAVENOUS | Status: DC | PRN

## 2016-07-20 MED ORDER — HYDROCORTISONE 2.5 % RE CREA
TOPICAL_CREAM | Freq: Three times a day (TID) | RECTAL | Status: DC
  Administered 2016-07-20 – 2016-07-21 (×3): via RECTAL
  Filled 2016-07-20: qty 28.35

## 2016-07-20 MED ORDER — INSULIN GLARGINE 100 UNIT/ML ~~LOC~~ SOLN
26.0000 [IU] | Freq: Two times a day (BID) | SUBCUTANEOUS | Status: DC
  Filled 2016-07-20: qty 0.26

## 2016-07-20 MED ORDER — SODIUM CHLORIDE 0.9% FLUSH: 3.0000 mL | INTRAVENOUS | Status: DC | PRN

## 2016-07-20 MED ORDER — GLIPIZIDE ER 10 MG PO TB24
10.0000 mg | ORAL_TABLET | Freq: Every day | ORAL | Status: DC
  Administered 2016-07-21: 10 mg via ORAL
  Filled 2016-07-20: qty 1

## 2016-07-20 MED ORDER — SODIUM CHLORIDE 0.9% FLUSH
3.0000 mL | Freq: Two times a day (BID) | INTRAVENOUS | Status: DC
  Administered 2016-07-20: 3 mL via INTRAVENOUS

## 2016-07-20 MED ORDER — INSULIN GLARGINE 100 UNIT/ML ~~LOC~~ SOLN
30.0000 [IU] | Freq: Two times a day (BID) | SUBCUTANEOUS | Status: DC
  Administered 2016-07-20 – 2016-07-21 (×2): 30 [IU] via SUBCUTANEOUS
  Filled 2016-07-20 (×3): qty 0.3
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
## 2016-07-20 NOTE — Progress Notes (Signed)
[facility] lab called. Stated patient positive for enteropathogenic e coli, toxigenic e coli. MD notified. "[id number]. Lab called, GI panel, pt. positive for enteropathogenic e. coli and toxigenic e. Coli."

## 2016-07-21 DIAGNOSIS — N183 Chronic kidney disease, stage 3 (moderate): Secondary | ICD-10-CM

## 2016-07-21 DIAGNOSIS — E1149 Type 2 diabetes mellitus with other diabetic neurological complication: Secondary | ICD-10-CM

## 2016-07-21 DIAGNOSIS — K50019 Crohn's disease of small intestine with unspecified complications: Secondary | ICD-10-CM

## 2016-07-21 DIAGNOSIS — A044 Other intestinal Escherichia coli infections: Secondary | ICD-10-CM

## 2016-07-21 DIAGNOSIS — R7989 Other specified abnormal findings of blood chemistry: Secondary | ICD-10-CM

## 2016-07-21 DIAGNOSIS — A058 Other specified bacterial foodborne intoxications: Secondary | ICD-10-CM

## 2016-07-21 DIAGNOSIS — Z221 Carrier of other intestinal infectious diseases: Secondary | ICD-10-CM

## 2016-07-21 DIAGNOSIS — K5000 Crohn's disease of small intestine without complications: Secondary | ICD-10-CM

## 2016-07-21 LAB — HEPATITIS PANEL, ACUTE
HCV Ab: 0.1 s/co ratio (ref 0.0–0.9)
HEP B C IGM: NEGATIVE
Hep A IgM: NEGATIVE
Hepatitis B Surface Ag: NEGATIVE

## 2016-07-21 LAB — CBC
HCT: 37.5 % (ref 36.0–46.0)
HEMOGLOBIN: 12.6 g/dL (ref 12.0–15.0)
MCH: 28.9 pg (ref 26.0–34.0)
MCHC: 33.6 g/dL (ref 30.0–36.0)
MCV: 86 fL (ref 78.0–100.0)
Platelets: 182 10*3/uL (ref 150–400)
RBC: 4.36 MIL/uL (ref 3.87–5.11)
RDW: 12.6 % (ref 11.5–15.5)
WBC: 9.1 10*3/uL (ref 4.0–10.5)

## 2016-07-21 LAB — GLUCOSE, CAPILLARY
GLUCOSE-CAPILLARY: 188 mg/dL — AB (ref 65–99)
GLUCOSE-CAPILLARY: 398 mg/dL — AB (ref 65–99)

## 2016-07-21 MED ORDER — METRONIDAZOLE 500 MG PO TABS: 500.0000 mg | ORAL_TABLET | Freq: Three times a day (TID) | ORAL | 0 refills | Status: DC

## 2016-07-21 MED ORDER — HYDROCORTISONE 2.5 % RE CREA: TOPICAL_CREAM | Freq: Three times a day (TID) | RECTAL | 0 refills | Status: DC

## 2016-07-21 MED ORDER — INSULIN GLARGINE 100 UNIT/ML SOLOSTAR PEN: 34.0000 [IU] | PEN_INJECTOR | Freq: Two times a day (BID) | SUBCUTANEOUS | 1 refills | Status: DC

## 2016-07-21 MED ORDER — CIPROFLOXACIN HCL 500 MG PO TABS
500.0000 mg | ORAL_TABLET | Freq: Two times a day (BID) | ORAL | 0 refills | Status: AC
Start: 2016-07-21 — End: 2016-07-24

## 2016-07-21 MED ORDER — GABAPENTIN 300 MG PO CAPS: 300.0000 mg | ORAL_CAPSULE | Freq: Three times a day (TID) | ORAL | 5 refills | Status: DC

## 2016-07-21 NOTE — Progress Notes (Signed)
Inpatient Diabetes Program Recommendations  AACE/ADA: New Consensus Statement on Inpatient Glycemic Control (2015)  Target Ranges:  Prepandial:   less than 140 mg/dL      Peak postprandial:   less than 180 mg/dL (1-2 hours)      Critically ill patients:  140 - 180 mg/dL   Results for [name], [name] (MRN [id number]) as of 07/21/2016 12:48  Ref. Range 07/20/2016 08:19 07/20/2016 12:30 07/20/2016 17:31 07/20/2016 22:01 07/21/2016 08:09 07/21/2016 12:42  Glucose-Capillary Latest Ref Range: 65 - 99 mg/dL 203 (H) 350 (H) 351 (H) 128 (H) 188 (H) 398 (H)   Review of Glycemic Control  Diabetes history: DM 2 Outpatient Diabetes medications: Glipizide 10 mg BID,  Lantus 15 units BID, Metformin 850 mg BID Current orders for Inpatient glycemic control: Lantus 30 units BID, Novolog Sensitive Correction 0-9 units tid, Glipizide 10 mg Daily  Inpatient Diabetes Program Recommendations:    Glucose increased significantly in the 300's after meals. Consider Novolog 5 units tid meal coverage if patient consumes at least 50% of meals.  Thanks,  Tama Headings RN, MSN, [facility] Inpatient Diabetes Coordinator Team Pager [phone] (8a-5p)

## 2016-07-21 NOTE — Progress Notes (Signed)
Nichole Munoz to be D/C'd Home per MD order.  Discussed with the patient and all questions fully answered.  VSS, Skin clean, dry and intact without evidence of skin break down, no evidence of skin tears noted. IV catheter discontinued intact. Site without signs and symptoms of complications. Dressing and pressure applied.  An After Visit Summary was printed and given to the patient. Patient received prescription.  D/c education completed with patient/family including follow up instructions, medication list, d/c activities limitations if indicated, with other d/c instructions as indicated by MD - patient able to verbalize understanding, all questions fully answered.   Patient instructed to return to ED, call 911, or call MD for any changes in condition.   Patient escorted via [place], and D/C home via private auto.  Christoper Fabian [name] 07/21/2016 4:19 PM

## 2016-07-21 NOTE — Discharge Summary (Signed)
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   Shigella/Enteroinvasive E coli (EIEC) NOT DETECTED NOT DETECTED Final   Cryptosporidium NOT DETECTED NOT DETECTED Final  Cyclospora cayetanensis NOT DETECTED NOT DETECTED Final   Entamoeba histolytica NOT DETECTED NOT DETECTED Final   Giardia lamblia NOT DETECTED NOT DETECTED Final   Adenovirus F40/41 NOT DETECTED NOT DETECTED Final   Astrovirus NOT DETECTED NOT DETECTED Final   Norovirus GI/GII NOT DETECTED NOT DETECTED Final   Rotavirus A NOT DETECTED NOT DETECTED Final   Sapovirus (I, II, IV, and V) NOT DETECTED NOT DETECTED Final  C difficile quick scan w PCR reflex     Status: Abnormal   Collection Time: 07/19/16  3:30 PM  Result Value Ref Range Status   C Diff antigen POSITIVE (A) NEGATIVE Final   C Diff toxin NEGATIVE NEGATIVE Final   C Diff interpretation Results are indeterminate. See PCR results.   Final  Clostridium Difficile by PCR     Status: None   Collection Time: 07/19/16  3:30 PM  Result Value Ref Range Status   Toxigenic C Difficile by pcr NEGATIVE NEGATIVE Final    Comment: Patient is colonized with non toxigenic C. difficile. May not need treatment unless significant symptoms are present.     Time coordinating discharge: Over 30 minutes  SIGNED:   Debbe Odea, MD  [facility] 07/21/2016, 2:02 PM Pager   If 7PM-7AM, please contact night-coverage [email or web address] Password [email or web address]

## 2016-07-21 NOTE — Discharge Instructions (Signed)
Continue to use Hydrocortisone 1 % cream for your hemorrhoids.   Please take all your medications with you for your next visit with your Primary MD. Please request your Primary MD to go over all hospital test results at the follow up. Please ask your Primary MD to get all Hospital records sent to his/her office.  If you experience worsening of your admission symptoms, develop shortness of breath, chest pain, suicidal or homicidal thoughts or a life threatening emergency, you must seek medical attention immediately by calling 911 or calling your MD.  Nichole Munoz must read the complete instructions/literature along with all the possible adverse reactions/side effects for all the medicines you take including new medications that have been prescribed to you. Take new medicines after you have completely understood and accpet all the possible adverse reactions/side effects.   Do not drive when taking pain medications or sedatives.    Do not take more than prescribed Pain, Sleep and Anxiety Medications  If you have smoked or chewed Tobacco in the last 2 yrs please stop. Stop any regular alcohol and or recreational drug use.  Wear Seat belts while driving.

## 2016-07-23 LAB — CULTURE, BLOOD (ROUTINE X 2)
CULTURE: NO GROWTH
Culture: NO GROWTH
Special Requests: ADEQUATE
Special Requests: ADEQUATE

## 2016-07-23 LAB — ANTINUCLEAR ANTIBODIES, IFA: ANA Ab, IFA: NEGATIVE

## 2016-09-16 ENCOUNTER — Other Ambulatory Visit: Payer: Self-pay

## 2016-09-16 ENCOUNTER — Emergency Department (HOSPITAL_COMMUNITY): Payer: Medicare Other

## 2016-09-16 ENCOUNTER — Encounter (HOSPITAL_COMMUNITY): Payer: Self-pay | Admitting: Emergency Medicine

## 2016-09-16 ENCOUNTER — Emergency Department (HOSPITAL_COMMUNITY)
Admission: EM | Admit: 2016-09-16 | Discharge: 2016-09-16 | Disposition: A | Discharge status: Home / Self Care | Payer: Medicare Other | Attending: Emergency Medicine | Admitting: Emergency Medicine

## 2016-09-16 DIAGNOSIS — M79602 Pain in left arm: Secondary | ICD-10-CM | POA: Diagnosis not present

## 2016-09-16 DIAGNOSIS — Y999 Unspecified external cause status: Secondary | ICD-10-CM | POA: Insufficient documentation

## 2016-09-16 DIAGNOSIS — R220 Localized swelling, mass and lump, head: Secondary | ICD-10-CM | POA: Insufficient documentation

## 2016-09-16 DIAGNOSIS — E1122 Type 2 diabetes mellitus with diabetic chronic kidney disease: Secondary | ICD-10-CM | POA: Diagnosis not present

## 2016-09-16 DIAGNOSIS — N183 Chronic kidney disease, stage 3 (moderate): Secondary | ICD-10-CM | POA: Diagnosis not present

## 2016-09-16 DIAGNOSIS — I129 Hypertensive chronic kidney disease with stage 1 through stage 4 chronic kidney disease, or unspecified chronic kidney disease: Secondary | ICD-10-CM | POA: Insufficient documentation

## 2016-09-16 DIAGNOSIS — Z79899 Other long term (current) drug therapy: Secondary | ICD-10-CM | POA: Diagnosis not present

## 2016-09-16 DIAGNOSIS — Y939 Activity, unspecified: Secondary | ICD-10-CM | POA: Insufficient documentation

## 2016-09-16 DIAGNOSIS — W010XXA Fall on same level from slipping, tripping and stumbling without subsequent striking against object, initial encounter: Secondary | ICD-10-CM | POA: Insufficient documentation

## 2016-09-16 DIAGNOSIS — Z7984 Long term (current) use of oral hypoglycemic drugs: Secondary | ICD-10-CM | POA: Insufficient documentation

## 2016-09-16 DIAGNOSIS — W19XXXA Unspecified fall, initial encounter: Secondary | ICD-10-CM

## 2016-09-16 DIAGNOSIS — Y929 Unspecified place or not applicable: Secondary | ICD-10-CM | POA: Insufficient documentation

## 2016-09-16 DIAGNOSIS — J45909 Unspecified asthma, uncomplicated: Secondary | ICD-10-CM | POA: Diagnosis not present

## 2016-09-16 DIAGNOSIS — R071 Chest pain on breathing: Secondary | ICD-10-CM | POA: Diagnosis not present

## 2016-09-16 MED ORDER — TRAMADOL HCL 50 MG PO TABS: 50.0000 mg | ORAL_TABLET | Freq: Four times a day (QID) | ORAL | 0 refills | Status: AC | PRN

## 2016-09-16 MED ORDER — ACETAMINOPHEN 325 MG PO TABS
650.0000 mg | ORAL_TABLET | Freq: Once | ORAL | Status: AC
  Administered 2016-09-16: 650 mg via ORAL
  Filled 2016-09-16: qty 2

## 2016-09-16 MED ORDER — ACETAMINOPHEN 325 MG PO TABS: 650.0000 mg | ORAL_TABLET | ORAL | 0 refills | Status: AC | PRN

## 2016-09-16 MED ORDER — TRAMADOL HCL 50 MG PO TABS
50.0000 mg | ORAL_TABLET | Freq: Once | ORAL | Status: AC
  Administered 2016-09-16: 50 mg via ORAL
  Filled 2016-09-16: qty 1
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
## 2016-09-16 NOTE — ED Notes (Signed)
Bed: WA03 Expected date:  Expected time:  Means of arrival:  Comments: 

## 2016-09-16 NOTE — ED Triage Notes (Signed)
Patient fell last night and landed on chair that fell onto floor. Patient c/o central chest pain that is worse with moving and coughing. Patient also has bruising, swelling and pain to left lateral arm/

## 2016-09-16 NOTE — Discharge Instructions (Signed)
Your imaging in the Emergency Department did not show any evidence of fractures in your head, neck, chest, and left arm. Please follow up with your primary care provider after leaving the hospital to ensure that your pain is resolving over the next few days. You were given some oral pain medications. Please take the Tylenol first and use tramadol sparingly, as this medication can make you more prone to falls if taken unnecessarily.

## 2016-09-16 NOTE — ED Notes (Signed)
Pt fell on chair chest first, then hit left forearm and left side of head under left eye.

## 2016-09-17 ENCOUNTER — Encounter: Payer: Self-pay | Admitting: Occupational Therapy

## 2016-09-17 NOTE — Therapy (Signed)
[facility] [address] [place], [place], [address] Phone: [phone]   Fax:  [phone]  Patient Details  Name: Laurine Kuyper MRN: [id number] Date of Birth: [birth year] Referring Provider:  No ref. provider found  OCCUPATIONAL THERAPY DISCHARGE SUMMARY  Visits from Start of Care: 1   Plan:                                                    Patient goals were not met. Patient is being discharged due to not returning since the last visit.  ?????      Mariah Milling , OTR/L 09/17/2016, 3:06 PM  [facility] [address] [place], [place], [address] Phone: [phone]   Fax:  [phone]

## 2017-01-24 ENCOUNTER — Emergency Department (HOSPITAL_COMMUNITY): Payer: Medicare Other

## 2017-01-24 ENCOUNTER — Other Ambulatory Visit: Payer: Self-pay

## 2017-01-24 ENCOUNTER — Inpatient Hospital Stay (HOSPITAL_COMMUNITY)
Admission: EM | Admit: 2017-01-24 | Discharge: 2017-01-26 | DRG: 683 | Disposition: A | Discharge status: Home / Self Care | Payer: Medicare Other | Attending: Internal Medicine | Admitting: Internal Medicine

## 2017-01-24 ENCOUNTER — Encounter (HOSPITAL_COMMUNITY): Payer: Self-pay

## 2017-01-24 DIAGNOSIS — N179 Acute kidney failure, unspecified: Secondary | ICD-10-CM | POA: Diagnosis not present

## 2017-01-24 DIAGNOSIS — E876 Hypokalemia: Secondary | ICD-10-CM | POA: Diagnosis present

## 2017-01-24 DIAGNOSIS — N183 Chronic kidney disease, stage 3 unspecified: Secondary | ICD-10-CM | POA: Diagnosis present

## 2017-01-24 DIAGNOSIS — E785 Hyperlipidemia, unspecified: Secondary | ICD-10-CM | POA: Diagnosis present

## 2017-01-24 DIAGNOSIS — W1839XA Other fall on same level, initial encounter: Secondary | ICD-10-CM | POA: Diagnosis present

## 2017-01-24 DIAGNOSIS — N390 Urinary tract infection, site not specified: Secondary | ICD-10-CM | POA: Diagnosis present

## 2017-01-24 DIAGNOSIS — E1140 Type 2 diabetes mellitus with diabetic neuropathy, unspecified: Secondary | ICD-10-CM | POA: Diagnosis present

## 2017-01-24 DIAGNOSIS — E860 Dehydration: Secondary | ICD-10-CM | POA: Diagnosis present

## 2017-01-24 DIAGNOSIS — Z794 Long term (current) use of insulin: Secondary | ICD-10-CM

## 2017-01-24 DIAGNOSIS — E1122 Type 2 diabetes mellitus with diabetic chronic kidney disease: Secondary | ICD-10-CM | POA: Diagnosis present

## 2017-01-24 DIAGNOSIS — R42 Dizziness and giddiness: Secondary | ICD-10-CM | POA: Diagnosis not present

## 2017-01-24 DIAGNOSIS — R739 Hyperglycemia, unspecified: Secondary | ICD-10-CM

## 2017-01-24 DIAGNOSIS — K5792 Diverticulitis of intestine, part unspecified, without perforation or abscess without bleeding: Secondary | ICD-10-CM | POA: Diagnosis present

## 2017-01-24 DIAGNOSIS — N1831 Chronic kidney disease, stage 3a: Secondary | ICD-10-CM | POA: Diagnosis present

## 2017-01-24 DIAGNOSIS — IMO0002 Reserved for concepts with insufficient information to code with codable children: Secondary | ICD-10-CM | POA: Diagnosis present

## 2017-01-24 DIAGNOSIS — E1165 Type 2 diabetes mellitus with hyperglycemia: Secondary | ICD-10-CM | POA: Diagnosis present

## 2017-01-24 DIAGNOSIS — Z91018 Allergy to other foods: Secondary | ICD-10-CM | POA: Diagnosis not present

## 2017-01-24 DIAGNOSIS — R8290 Unspecified abnormal findings in urine: Secondary | ICD-10-CM | POA: Diagnosis present

## 2017-01-24 DIAGNOSIS — J45909 Unspecified asthma, uncomplicated: Secondary | ICD-10-CM

## 2017-01-24 DIAGNOSIS — J302 Other seasonal allergic rhinitis: Secondary | ICD-10-CM | POA: Diagnosis present

## 2017-01-24 DIAGNOSIS — E1169 Type 2 diabetes mellitus with other specified complication: Secondary | ICD-10-CM | POA: Diagnosis present

## 2017-01-24 DIAGNOSIS — E119 Type 2 diabetes mellitus without complications: Secondary | ICD-10-CM | POA: Diagnosis present

## 2017-01-24 DIAGNOSIS — Z833 Family history of diabetes mellitus: Secondary | ICD-10-CM | POA: Diagnosis not present

## 2017-01-24 DIAGNOSIS — I10 Essential (primary) hypertension: Secondary | ICD-10-CM | POA: Diagnosis present

## 2017-01-24 DIAGNOSIS — Z888 Allergy status to other drugs, medicaments and biological substances status: Secondary | ICD-10-CM | POA: Diagnosis not present

## 2017-01-24 DIAGNOSIS — E1151 Type 2 diabetes mellitus with diabetic peripheral angiopathy without gangrene: Secondary | ICD-10-CM | POA: Diagnosis present

## 2017-01-24 DIAGNOSIS — I129 Hypertensive chronic kidney disease with stage 1 through stage 4 chronic kidney disease, or unspecified chronic kidney disease: Secondary | ICD-10-CM | POA: Diagnosis present

## 2017-01-24 DIAGNOSIS — K5732 Diverticulitis of large intestine without perforation or abscess without bleeding: Secondary | ICD-10-CM | POA: Diagnosis present

## 2017-01-24 DIAGNOSIS — B9620 Unspecified Escherichia coli [E. coli] as the cause of diseases classified elsewhere: Secondary | ICD-10-CM | POA: Diagnosis present

## 2017-01-24 DIAGNOSIS — Z79899 Other long term (current) drug therapy: Secondary | ICD-10-CM

## 2017-01-24 DIAGNOSIS — E1129 Type 2 diabetes mellitus with other diabetic kidney complication: Secondary | ICD-10-CM

## 2017-01-24 HISTORY — DX: Type 2 diabetes mellitus without complications: Z79.4

## 2017-01-24 HISTORY — DX: Type 2 diabetes mellitus without complications: E11.9

## 2017-01-24 HISTORY — DX: Diverticulitis of intestine, part unspecified, without perforation or abscess without bleeding: K57.92

## 2017-01-24 LAB — GLUCOSE, CAPILLARY: GLUCOSE-CAPILLARY: 164 mg/dL — AB (ref 65–99)

## 2017-01-24 LAB — CBC WITH DIFFERENTIAL/PLATELET
Basophils Absolute: 0.1 10*3/uL (ref 0.0–0.1)
Basophils Relative: 1 %
Eosinophils Absolute: 0.2 10*3/uL (ref 0.0–0.7)
Eosinophils Relative: 2 %
HEMATOCRIT: 35.2 % — AB (ref 36.0–46.0)
HEMOGLOBIN: 12.7 g/dL (ref 12.0–15.0)
LYMPHS ABS: 2.2 10*3/uL (ref 0.7–4.0)
LYMPHS PCT: 20 %
MCH: 30.8 pg (ref 26.0–34.0)
MCHC: 36.1 g/dL — ABNORMAL HIGH (ref 30.0–36.0)
MCV: 85.2 fL (ref 78.0–100.0)
Monocytes Absolute: 0.5 10*3/uL (ref 0.1–1.0)
Monocytes Relative: 5 %
NEUTROS PCT: 72 %
Neutro Abs: 8.3 10*3/uL — ABNORMAL HIGH (ref 1.7–7.7)
Platelets: 262 10*3/uL (ref 150–400)
RBC: 4.13 MIL/uL (ref 3.87–5.11)
RDW: 12.1 % (ref 11.5–15.5)
WBC: 11.3 10*3/uL — AB (ref 4.0–10.5)

## 2017-01-24 LAB — COMPREHENSIVE METABOLIC PANEL
ALK PHOS: 75 U/L (ref 38–126)
ALT: 9 U/L — AB (ref 14–54)
AST: 13 U/L — ABNORMAL LOW (ref 15–41)
Albumin: 3.5 g/dL (ref 3.5–5.0)
Anion gap: 12 (ref 5–15)
BUN: 40 mg/dL — ABNORMAL HIGH (ref 6–20)
CALCIUM: 9.2 mg/dL (ref 8.9–10.3)
CO2: 22 mmol/L (ref 22–32)
CREATININE: 1.51 mg/dL — AB (ref 0.44–1.00)
Chloride: 96 mmol/L — ABNORMAL LOW (ref 101–111)
GFR, EST AFRICAN AMERICAN: 39 mL/min — AB (ref >60)
GFR, EST NON AFRICAN AMERICAN: 33 mL/min — AB (ref >60)
Glucose, Bld: 427 mg/dL — ABNORMAL HIGH (ref 65–99)
Potassium: 4.7 mmol/L (ref 3.5–5.1)
Sodium: 130 mmol/L — ABNORMAL LOW (ref 135–145)
Total Bilirubin: 1.2 mg/dL (ref 0.3–1.2)
Total Protein: 8 g/dL (ref 6.5–8.1)

## 2017-01-24 LAB — HEMOGLOBIN A1C
HEMOGLOBIN A1C: 10.5 % — AB (ref 4.8–5.6)
Mean Plasma Glucose: 254.65 mg/dL

## 2017-01-24 LAB — URINALYSIS, ROUTINE W REFLEX MICROSCOPIC
Bilirubin Urine: NEGATIVE
Hgb urine dipstick: NEGATIVE
Ketones, ur: 5 mg/dL — AB
Nitrite: POSITIVE — AB
PROTEIN: NEGATIVE mg/dL
Specific Gravity, Urine: 1.015 (ref 1.005–1.030)
Squamous Epithelial / LPF: NONE SEEN
pH: 6 (ref 5.0–8.0)

## 2017-01-24 LAB — CBG MONITORING, ED
Glucose-Capillary: 423 mg/dL — ABNORMAL HIGH (ref 65–99)
Glucose-Capillary: 259 mg/dL — ABNORMAL HIGH (ref 65–99)

## 2017-01-24 LAB — MAGNESIUM: Magnesium: 1 mg/dL — ABNORMAL LOW (ref 1.7–2.4)

## 2017-01-24 LAB — LIPASE, BLOOD: LIPASE: 32 U/L (ref 11–51)

## 2017-01-24 MED ORDER — CIPROFLOXACIN IN D5W 400 MG/200ML IV SOLN
400.0000 mg | Freq: Once | INTRAVENOUS | Status: AC
  Administered 2017-01-24: 400 mg via INTRAVENOUS
  Filled 2017-01-24: qty 200

## 2017-01-24 MED ORDER — HYDRALAZINE HCL 20 MG/ML IJ SOLN: 10.0000 mg | Freq: Four times a day (QID) | INTRAMUSCULAR | Status: DC | PRN

## 2017-01-24 MED ORDER — INSULIN ASPART 100 UNIT/ML ~~LOC~~ SOLN
0.0000 [IU] | SUBCUTANEOUS | Status: DC
  Administered 2017-01-24: 2 [IU] via SUBCUTANEOUS
  Administered 2017-01-24: 5 [IU] via SUBCUTANEOUS
  Administered 2017-01-25: 2 [IU] via SUBCUTANEOUS

## 2017-01-24 MED ORDER — SODIUM CHLORIDE 0.9 % IV BOLUS (SEPSIS)
500.0000 mL | Freq: Once | INTRAVENOUS | Status: AC
  Administered 2017-01-24: 500 mL via INTRAVENOUS

## 2017-01-24 MED ORDER — ACETAMINOPHEN 325 MG PO TABS
650.0000 mg | ORAL_TABLET | Freq: Four times a day (QID) | ORAL | Status: DC | PRN
Start: 2017-01-24 — End: 2017-01-26
  Administered 2017-01-25: 650 mg via ORAL
  Filled 2017-01-24 (×2): qty 2

## 2017-01-24 MED ORDER — PROMETHAZINE HCL 25 MG/ML IJ SOLN: 6.2500 mg | Freq: Four times a day (QID) | INTRAMUSCULAR | Status: DC | PRN

## 2017-01-24 MED ORDER — MORPHINE SULFATE (PF) 4 MG/ML IV SOLN
1.0000 mg | INTRAVENOUS | Status: DC | PRN
  Administered 2017-01-25: 2 mg via INTRAVENOUS
  Administered 2017-01-25: 1 mg via INTRAVENOUS
  Filled 2017-01-24 (×2): qty 1

## 2017-01-24 MED ORDER — SODIUM CHLORIDE 0.9% FLUSH
3.0000 mL | Freq: Two times a day (BID) | INTRAVENOUS | Status: DC
  Administered 2017-01-24 – 2017-01-26 (×4): 3 mL via INTRAVENOUS

## 2017-01-24 MED ORDER — ONDANSETRON HCL 4 MG/2ML IJ SOLN: 4.0000 mg | Freq: Four times a day (QID) | INTRAMUSCULAR | Status: DC | PRN

## 2017-01-24 MED ORDER — INSULIN ASPART 100 UNIT/ML IV SOLN
10.0000 [IU] | Freq: Once | INTRAVENOUS | Status: DC
  Filled 2017-01-24: qty 0.1

## 2017-01-24 MED ORDER — INSULIN ASPART 100 UNIT/ML ~~LOC~~ SOLN
10.0000 [IU] | Freq: Once | SUBCUTANEOUS | Status: AC
  Administered 2017-01-24: 10 [IU] via INTRAVENOUS

## 2017-01-24 MED ORDER — ENOXAPARIN SODIUM 40 MG/0.4ML ~~LOC~~ SOLN
40.0000 mg | SUBCUTANEOUS | Status: DC
  Administered 2017-01-24 – 2017-01-25 (×2): 40 mg via SUBCUTANEOUS
  Filled 2017-01-24 (×2): qty 0.4

## 2017-01-24 MED ORDER — INSULIN ASPART 100 UNIT/ML ~~LOC~~ SOLN
SUBCUTANEOUS | Status: AC
  Filled 2017-01-24: qty 1

## 2017-01-24 MED ORDER — ACETAMINOPHEN 650 MG RE SUPP: 650.0000 mg | Freq: Four times a day (QID) | RECTAL | Status: DC | PRN

## 2017-01-24 MED ORDER — CIPROFLOXACIN IN D5W 400 MG/200ML IV SOLN
400.0000 mg | Freq: Two times a day (BID) | INTRAVENOUS | Status: DC
  Administered 2017-01-24 – 2017-01-26 (×3): 400 mg via INTRAVENOUS
  Filled 2017-01-24 (×4): qty 200

## 2017-01-24 MED ORDER — ALBUTEROL SULFATE (2.5 MG/3ML) 0.083% IN NEBU
3.0000 mL | INHALATION_SOLUTION | Freq: Four times a day (QID) | RESPIRATORY_TRACT | Status: DC | PRN
  Administered 2017-01-25 – 2017-01-26 (×3): 3 mL via RESPIRATORY_TRACT
  Filled 2017-01-24 (×3): qty 3

## 2017-01-24 MED ORDER — IOPAMIDOL (ISOVUE-300) INJECTION 61%
INTRAVENOUS | Status: AC
  Administered 2017-01-24: 75 mL
  Filled 2017-01-24: qty 75

## 2017-01-24 MED ORDER — METRONIDAZOLE IN NACL 5-0.79 MG/ML-% IV SOLN
500.0000 mg | Freq: Once | INTRAVENOUS | Status: AC
Start: 2017-01-24 — End: 2017-01-24
  Administered 2017-01-24: 500 mg via INTRAVENOUS
  Filled 2017-01-24: qty 100

## 2017-01-24 MED ORDER — INSULIN GLARGINE 100 UNIT/ML ~~LOC~~ SOLN
30.0000 [IU] | Freq: Two times a day (BID) | SUBCUTANEOUS | Status: DC
  Administered 2017-01-24 – 2017-01-26 (×5): 30 [IU] via SUBCUTANEOUS
  Filled 2017-01-24 (×6): qty 0.3

## 2017-01-24 MED ORDER — METRONIDAZOLE IN NACL 5-0.79 MG/ML-% IV SOLN
500.0000 mg | Freq: Three times a day (TID) | INTRAVENOUS | Status: DC
  Administered 2017-01-24 – 2017-01-26 (×6): 500 mg via INTRAVENOUS
  Filled 2017-01-24 (×7): qty 100

## 2017-01-24 MED ORDER — SODIUM CHLORIDE 0.9 % IV SOLN
INTRAVENOUS | Status: DC
  Administered 2017-01-24 – 2017-01-26 (×3): via INTRAVENOUS
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
## 2017-01-24 NOTE — ED Notes (Signed)
Attempted to call report
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
## 2017-01-24 NOTE — ED Notes (Signed)
Patient transported to CT 
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
## 2017-01-25 DIAGNOSIS — K5792 Diverticulitis of intestine, part unspecified, without perforation or abscess without bleeding: Secondary | ICD-10-CM

## 2017-01-25 LAB — COMPREHENSIVE METABOLIC PANEL
ALT: 10 U/L — ABNORMAL LOW (ref 14–54)
ANION GAP: 12 (ref 5–15)
AST: 15 U/L (ref 15–41)
Albumin: 3.3 g/dL — ABNORMAL LOW (ref 3.5–5.0)
Alkaline Phosphatase: 68 U/L (ref 38–126)
BUN: 26 mg/dL — ABNORMAL HIGH (ref 6–20)
CHLORIDE: 104 mmol/L (ref 101–111)
CO2: 22 mmol/L (ref 22–32)
CREATININE: 1.28 mg/dL — AB (ref 0.44–1.00)
Calcium: 9 mg/dL (ref 8.9–10.3)
GFR, EST AFRICAN AMERICAN: 47 mL/min — AB (ref >60)
GFR, EST NON AFRICAN AMERICAN: 41 mL/min — AB (ref >60)
Glucose, Bld: 138 mg/dL — ABNORMAL HIGH (ref 65–99)
POTASSIUM: 3.3 mmol/L — AB (ref 3.5–5.1)
SODIUM: 138 mmol/L (ref 135–145)
Total Bilirubin: 0.7 mg/dL (ref 0.3–1.2)
Total Protein: 8.3 g/dL — ABNORMAL HIGH (ref 6.5–8.1)

## 2017-01-25 LAB — CBC
HCT: 33.9 % — ABNORMAL LOW (ref 36.0–46.0)
Hemoglobin: 11.6 g/dL — ABNORMAL LOW (ref 12.0–15.0)
MCH: 29.6 pg (ref 26.0–34.0)
MCHC: 34.2 g/dL (ref 30.0–36.0)
MCV: 86.5 fL (ref 78.0–100.0)
PLATELETS: 285 10*3/uL (ref 150–400)
RBC: 3.92 MIL/uL (ref 3.87–5.11)
RDW: 12.5 % (ref 11.5–15.5)
WBC: 9.5 10*3/uL (ref 4.0–10.5)

## 2017-01-25 LAB — GLUCOSE, CAPILLARY
GLUCOSE-CAPILLARY: 158 mg/dL — AB (ref 65–99)
GLUCOSE-CAPILLARY: 283 mg/dL — AB (ref 65–99)
GLUCOSE-CAPILLARY: 99 mg/dL (ref 65–99)
Glucose-Capillary: 296 mg/dL — ABNORMAL HIGH (ref 65–99)
Glucose-Capillary: 318 mg/dL — ABNORMAL HIGH (ref 65–99)

## 2017-01-25 MED ORDER — POTASSIUM CHLORIDE CRYS ER 20 MEQ PO TBCR
40.0000 meq | EXTENDED_RELEASE_TABLET | Freq: Once | ORAL | Status: AC
  Administered 2017-01-25: 40 meq via ORAL
  Filled 2017-01-25: qty 2

## 2017-01-25 MED ORDER — INSULIN ASPART 100 UNIT/ML ~~LOC~~ SOLN
0.0000 [IU] | Freq: Three times a day (TID) | SUBCUTANEOUS | Status: DC
  Administered 2017-01-25: 5 [IU] via SUBCUTANEOUS
  Administered 2017-01-25: 7 [IU] via SUBCUTANEOUS
  Administered 2017-01-26: 3 [IU] via SUBCUTANEOUS

## 2017-01-25 MED ORDER — MAGNESIUM SULFATE 2 GM/50ML IV SOLN
2.0000 g | Freq: Once | INTRAVENOUS | Status: AC
  Administered 2017-01-25: 2 g via INTRAVENOUS
  Filled 2017-01-25: qty 50

## 2017-01-25 NOTE — Progress Notes (Addendum)
PROGRESS NOTE    Nichole Munoz  UXL:[id number] DOB: [birth year] DOA: 01/24/2017 PCP: [facility], [facility]     Brief Narrative:  Nichole Munoz is a 72 y.o. [place] female with medical history significant for asthma secondary to seasonal allergies, diverticulitis in 2016, hypertension, diabetes on insulin and arthritis. Patient has been experiencing nausea, vomiting and diarrhea for 4 days.  Son explains that multiple family members with similar symptoms over the past week.  In addition to significant abdominal pain, she began reporting more generalized weakness and fatigue with associated dizziness this am so EMS was called to the home.  Patient had significant left lower quadrant abdominal pain with guarding to palpation on exam.  CT the abdomen and pelvis feels acute diverticulitis in the descending colon with a possible evolving area in the proximal sigmoid colon.  No free air or abscess formation.  Patient was admitted for further treatment with IV antibiotics.  Assessment & Plan:   Principal Problem:   Acute diverticulitis of intestine Active Problems:   CKD (chronic kidney disease) stage 3, GFR 30-59 ml/min (HCC)   DM (diabetes mellitus), type 2, uncontrolled, with renal complications (HCC)   Hypomagnesemia   Acute lower UTI   Acute kidney injury ([place])   Diabetes mellitus type 2, uncontrolled ([place])   Hypertension   Asthma due to seasonal allergies   Acute diverticulitis -Continue IV Cipro, Flagyl -Patient reported having a normal colonoscopy 2 years ago.  She did have an episode of diverticulitis back in September 2016.   Acute kidney injury on CKD stage 3  -Associated with dehydration from nausea, vomiting, diarrhea -Baseline creatinine 1.2 -Improved with IV fluids  Uncontrolled type 2 diabetes with hyperglycemia -Son manages patient's insulin, states that he gives her Lantus 30-40 units every 6 hours.  I clarified with him multiple times that she was getting long  acting insulin multiple times daily and not Novolog or Humalog.  He states that he is a diabetic himself and has been giving her the long-acting Lantus every 6 hours due to blood sugars in the 300-400s.  And also states that patient oftentimes refuses blood sugar checks at home  -Ha1c 10.5 -Diabetic coordinator consulted for education of patient and family  UTI POA -Urine culture with >100,000 GNR -Antibiotics as above   Hypokalemia Hypomagnesemia -Replace, trend  Essential hypertension -Hold lisinopril in setting of acute kidney injury -Blood pressure stable   DVT prophylaxis: Lovenox Code Status: Full Family Communication: Son at bedside Disposition Plan: Pending improvement, return back home   Consultants:   None  Procedures:   None  Antimicrobials:  Anti-infectives (From admission, onward)   Start     Dose/Rate Route Frequency Ordered Stop   01/25/17 0000  ciprofloxacin (CIPRO) IVPB 400 mg     400 mg 200 mL/hr over 60 Minutes Intravenous Every 12 hours 01/24/17 1433     01/24/17 2000  metroNIDAZOLE (FLAGYL) IVPB 500 mg     500 mg 100 mL/hr over 60 Minutes Intravenous Every 8 hours 01/24/17 1433     01/24/17 1245  ciprofloxacin (CIPRO) IVPB 400 mg     400 mg 200 mL/hr over 60 Minutes Intravenous  Once 01/24/17 1235 01/24/17 1437   01/24/17 1245  metroNIDAZOLE (FLAGYL) IVPB 500 mg     500 mg 100 mL/hr over 60 Minutes Intravenous  Once 01/24/17 1235 01/24/17 1437        Subjective: Son is at bedside and translates.  Patient states that she feels better.  No further vomiting or diarrhea.  She continues to have left lower abdominal pain which has improved.  She wants to eat more.  Objective: Vitals:   01/24/17 1354 01/24/17 1549 01/24/17 2051 01/25/17 0445  BP: 137/76 117/66 (!) 141/90 107/86  Pulse: 94 85 88 77  Resp: 20   19  Temp: 98.6 F (37 C) 98.4 F (36.9 C) 98.5 F (36.9 C) 97.8 F (36.6 C)  TempSrc: Oral Oral Oral Oral  SpO2: 96%  99% 99%    Weight:  59.9 kg (132 lb 0.9 oz)    Height:  5\' 6"  (1.676 m)      Intake/Output Summary (Last 24 hours) at 01/25/2017 1351 Last data filed at 01/25/2017 1148 Gross per 24 hour  Intake 1250 ml  Output 1925 ml  Net -675 ml   Filed Weights   01/24/17 0933 01/24/17 1549  Weight: 60.8 kg (134 lb) 59.9 kg (132 lb 0.9 oz)    Examination:  General exam: Appears calm and comfortable  Respiratory system: Clear to auscultation. Respiratory effort normal. Cardiovascular system: S1 & S2 heard, RRR. No JVD, murmurs, rubs, gallops or clicks. No pedal edema. Gastrointestinal system: Abdomen is nondistended, soft and TTP LLQ. No organomegaly or masses felt. Normal bowel sounds heard. Central nervous system: Alert and oriented. No focal neurological deficits. Extremities: Symmetric 5 x 5 power. Skin: No rashes, lesions or ulcers Psychiatry: Judgement and insight appear normal. Mood & affect appropriate.   Data Reviewed: I have personally reviewed following labs and imaging studies  CBC: Recent Labs  Lab 01/24/17 0956 01/25/17 0836  WBC 11.3* 9.5  NEUTROABS 8.3*  --   HGB 12.7 11.6*  HCT 35.2* 33.9*  MCV 85.2 86.5  PLT 262 563   Basic Metabolic Panel: Recent Labs  Lab 01/24/17 0956 01/24/17 1536 01/25/17 0836  NA 130*  --  138  K 4.7  --  3.3*  CL 96*  --  104  CO2 22  --  22  GLUCOSE 427*  --  138*  BUN 40*  --  26*  CREATININE 1.51*  --  1.28*  CALCIUM 9.2  --  9.0  MG  --  1.0*  --    GFR: Estimated Creatinine Clearance: 37.2 mL/min (A) (by C-G formula based on SCr of 1.28 mg/dL (H)). Liver Function Tests: Recent Labs  Lab 01/24/17 0956 01/25/17 0836  AST 13* 15  ALT 9* 10*  ALKPHOS 75 68  BILITOT 1.2 0.7  PROT 8.0 8.3*  ALBUMIN 3.5 3.3*   Recent Labs  Lab 01/24/17 0956  LIPASE 32   No results for input(s): AMMONIA in the last 168 hours. Coagulation Profile: No results for input(s): INR, PROTIME in the last 168 hours. Cardiac Enzymes: No results for  input(s): CKTOTAL, CKMB, CKMBINDEX, TROPONINI in the last 168 hours. BNP (last 3 results) No results for input(s): PROBNP in the last 8760 hours. HbA1C: Recent Labs    01/24/17 1536  HGBA1C 10.5*   CBG: Recent Labs  Lab 01/24/17 1500 01/24/17 2009 01/25/17 0014 01/25/17 0345 01/25/17 1115  GLUCAP 259* 164* 158* 99 296*   Lipid Profile: No results for input(s): CHOL, HDL, LDLCALC, TRIG, CHOLHDL, LDLDIRECT in the last 72 hours. Thyroid Function Tests: No results for input(s): TSH, T4TOTAL, FREET4, T3FREE, THYROIDAB in the last 72 hours. Anemia Panel: No results for input(s): VITAMINB12, FOLATE, FERRITIN, TIBC, IRON, RETICCTPCT in the last 72 hours. Sepsis Labs: No results for input(s): PROCALCITON, LATICACIDVEN in the last 168 hours.  Recent Results (from the past 240 hour(s))  Urine culture     Status: Abnormal (Preliminary result)   Collection Time: 01/24/17 12:25 PM  Result Value Ref Range Status   Specimen Description URINE, CLEAN CATCH  Final   Special Requests NONE  Final   Culture >=100,000 COLONIES/mL GRAM NEGATIVE RODS (A)  Final   Report Status PENDING  Incomplete       Radiology Studies: Ct Head Wo Contrast  Result Date: 01/24/2017 CLINICAL DATA:  Fatigue and dizziness. EXAM: CT HEAD WITHOUT CONTRAST TECHNIQUE: Contiguous axial images were obtained from the base of the skull through the vertex without intravenous contrast. COMPARISON:  September 16, 2016 FINDINGS: Brain: There is age related volume loss. There is a cavum septum pellucidum, an anatomic variant. There is no intracranial mass, hemorrhage, extra-axial fluid collection, or midline shift. 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 There is mild small vessel disease in the centra semiovale bilaterally. Elsewhere gray-white compartments appear normal. No acute infarct is evident. Vascular: There is no appreciable hyperdense vessel. There is  calcification in each carotid siphon region as well as in the distal left and right vertebral arteries. Skull: Bony calvarium appears intact. Sinuses/Orbits: There is mucosal thickening in several ethmoid air cells bilaterally. Other visualized paranasal sinuses are clear. Orbits appear symmetric bilaterally. Other: Mastoid air cells are clear. IMPRESSION: Atrophy with mild periventricular small vessel disease. 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 No acute infarct evident. No mass or hemorrhage. There are foci of arterial vascular calcification. There is mucosal thickening in several ethmoid air cells. Electronically Signed   By: Lowella Grip III M.D.   On: 01/24/2017 11:58   Ct Abdomen Pelvis W Contrast  Result Date: 01/24/2017 CLINICAL DATA:  72 year old female with history of nausea, vomiting and diarrhea with abdominal pain for the past couple of days. EXAM: CT ABDOMEN AND PELVIS WITH CONTRAST TECHNIQUE: Multidetector CT imaging of the abdomen and pelvis was performed using the standard protocol following bolus administration of intravenous contrast. CONTRAST:  43mL ISOVUE-300 IOPAMIDOL (ISOVUE-300) INJECTION 61% COMPARISON:  CT the abdomen and pelvis 07/19/2016. FINDINGS: Lower chest: Unremarkable. Hepatobiliary: No cystic or solid hepatic lesions. No intra or extrahepatic biliary ductal dilatation. Gallbladder is normal in appearance. Pancreas: No pancreatic mass. No pancreatic ductal dilatation. No pancreatic or peripancreatic fluid or inflammatory changes. Spleen: Unremarkable. Adrenals/Urinary Tract: 3 mm nonobstructive calculus in the lower pole collecting system of the right kidney. Subcentimeter low-attenuation lesion in the upper pole of the right kidney, too small to characterize, but statistically likely a tiny cyst. Left kidney and adrenal glands are otherwise normal in appearance. No hydroureteronephrosis. Urinary bladder is normal in appearance.  Stomach/Bowel: Normal appearance of the stomach. No pathologic dilatation of small bowel or colon. Numerous colonic diverticulae are noted, and in the region of the distal descending colon there is extensive mural thickening as well as extensive surrounding inflammatory changes suggestive of an acute diverticulitis. No discrete diverticular abscess or signs of frank perforation or confidently identified at this time. In addition, there is pronounced mural thickening in the proximal sigmoid colon somewhat mass-like, best appreciated on axial image 73 of series 3, where there is also some subtle surrounding inflammatory changes. The appendix is not confidently identified and may be surgically absent. Regardless, there are no inflammatory changes noted adjacent to the cecum to suggest the presence of an acute appendicitis at this time. Vascular/Lymphatic: Aortic atherosclerosis, without evidence  of aneurysm or dissection in the abdominal or pelvic vasculature. No lymphadenopathy noted in the abdomen or pelvis. Reproductive: Uterus and ovaries are unremarkable in appearance. Other: No significant volume of ascites.  No pneumoperitoneum. 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 There are no aggressive appearing lytic or blastic lesions noted in the visualized portions of the skeleton. IMPRESSION: 1. Findings are compatible with acute diverticulitis. The most obvious focus of acute inflammation is in the distal descending colon. However, there is an additional area of potential inflammation in the proximal sigmoid colon where there is also profound mural thickening. The possibility of 2 regions of acute diverticulitis should be considered. Additionally, given the mass-like mural thickening in these regions, further evaluation with nonemergent colonoscopy should be considered after resolution of the patient's acute presentation to exclude the possibility of underlying  neoplasm. 2. Aortic atherosclerosis. 3. Additional incidental findings, as above. Aortic Atherosclerosis (ICD10-I70.0). Electronically Signed   By: Vinnie Langton M.D.   On: 01/24/2017 12:30      Scheduled Meds: . enoxaparin (LOVENOX) injection  40 mg Subcutaneous Q24H  . insulin aspart  0-9 Units Subcutaneous TID WC  . insulin glargine  30 Units Subcutaneous BID  . sodium chloride flush  3 mL Intravenous Q12H   Continuous Infusions: . sodium chloride 100 mL/hr at 01/25/17 0415  . ciprofloxacin Stopped (01/25/17 1303)  . metronidazole 500 mg (01/25/17 1310)     LOS: 1 day    Time spent: 40 minutes   Dessa Phi, DO [facility] [email or web address] Password [email or web address] 01/25/2017, 1:51 PM

## 2017-01-26 LAB — BASIC METABOLIC PANEL
ANION GAP: 9 (ref 5–15)
BUN: 13 mg/dL (ref 6–20)
CHLORIDE: 108 mmol/L (ref 101–111)
CO2: 24 mmol/L (ref 22–32)
Calcium: 8.8 mg/dL — ABNORMAL LOW (ref 8.9–10.3)
Creatinine, Ser: 1.15 mg/dL — ABNORMAL HIGH (ref 0.44–1.00)
GFR calc Af Amer: 54 mL/min — ABNORMAL LOW (ref >60)
GFR calc non Af Amer: 46 mL/min — ABNORMAL LOW (ref >60)
GLUCOSE: 96 mg/dL (ref 65–99)
Potassium: 4.3 mmol/L (ref 3.5–5.1)
Sodium: 141 mmol/L (ref 135–145)

## 2017-01-26 LAB — GLUCOSE, CAPILLARY
Glucose-Capillary: 194 mg/dL — ABNORMAL HIGH (ref 65–99)
Glucose-Capillary: 231 mg/dL — ABNORMAL HIGH (ref 65–99)
Glucose-Capillary: 90 mg/dL (ref 65–99)

## 2017-01-26 LAB — CBC
HEMATOCRIT: 32 % — AB (ref 36.0–46.0)
HEMOGLOBIN: 11 g/dL — AB (ref 12.0–15.0)
MCH: 29.7 pg (ref 26.0–34.0)
MCHC: 34.4 g/dL (ref 30.0–36.0)
MCV: 86.5 fL (ref 78.0–100.0)
Platelets: 267 10*3/uL (ref 150–400)
RBC: 3.7 MIL/uL — AB (ref 3.87–5.11)
RDW: 12.4 % (ref 11.5–15.5)
WBC: 8.6 10*3/uL (ref 4.0–10.5)

## 2017-01-26 LAB — URINE CULTURE: Culture: >=100000 — AB

## 2017-01-26 LAB — MAGNESIUM: Magnesium: 1.2 mg/dL — ABNORMAL LOW (ref 1.7–2.4)

## 2017-01-26 MED ORDER — HYDROCERIN EX CREA
TOPICAL_CREAM | Freq: Two times a day (BID) | CUTANEOUS | Status: DC
  Administered 2017-01-26: 13:00:00 via TOPICAL
  Filled 2017-01-26: qty 113

## 2017-01-26 MED ORDER — CEFPODOXIME PROXETIL 200 MG PO TABS
200.0000 mg | ORAL_TABLET | Freq: Two times a day (BID) | ORAL | Status: DC
  Filled 2017-01-26 (×2): qty 1

## 2017-01-26 MED ORDER — METRONIDAZOLE 500 MG PO TABS: 500.0000 mg | ORAL_TABLET | Freq: Three times a day (TID) | ORAL | Status: DC

## 2017-01-26 MED ORDER — POLYVINYL ALCOHOL 1.4 % OP SOLN
1.0000 [drp] | Freq: Three times a day (TID) | OPHTHALMIC | Status: DC | PRN
  Filled 2017-01-26: qty 15

## 2017-01-26 MED ORDER — BLOOD GLUCOSE METER KIT: PACK | 0 refills | Status: DC

## 2017-01-26 MED ORDER — INSULIN ASPART 100 UNIT/ML FLEXPEN: PEN_INJECTOR | SUBCUTANEOUS | 0 refills | Status: DC

## 2017-01-26 MED ORDER — METRONIDAZOLE 500 MG PO TABS: 500.0000 mg | ORAL_TABLET | Freq: Three times a day (TID) | ORAL | 0 refills | Status: AC

## 2017-01-26 MED ORDER — CEFPODOXIME PROXETIL 200 MG PO TABS: 200.0000 mg | ORAL_TABLET | Freq: Two times a day (BID) | ORAL | 0 refills | Status: AC

## 2017-01-26 MED ORDER — DEXTROSE 5 % IV SOLN
1.0000 g | INTRAVENOUS | Status: DC
  Filled 2017-01-26: qty 10

## 2017-01-26 MED ORDER — INSULIN PEN NEEDLE 31G X 5 MM MISC: 0 refills | Status: DC

## 2017-01-26 MED ORDER — HYPROMELLOSE (GONIOSCOPIC) 2.5 % OP SOLN
1.0000 [drp] | Freq: Three times a day (TID) | OPHTHALMIC | Status: DC | PRN
  Filled 2017-01-26: qty 15

## 2017-01-26 MED ORDER — INSULIN GLARGINE 100 UNIT/ML SOLOSTAR PEN: 30.0000 [IU] | PEN_INJECTOR | Freq: Two times a day (BID) | SUBCUTANEOUS | 0 refills | Status: DC

## 2017-01-26 MED ORDER — MAGNESIUM SULFATE 2 GM/50ML IV SOLN
2.0000 g | Freq: Once | INTRAVENOUS | Status: AC
  Administered 2017-01-26: 2 g via INTRAVENOUS
  Filled 2017-01-26: qty 50

## 2017-01-26 NOTE — Discharge Instructions (Signed)
·   Check your blood sugar 4 times daily: before breakfast, before lunch, before dinner, before sleep. You may also check throughout the day if you feel that your blood sugar is too low or too high.   Lantus is your long-acting insulin. This should be used at the same time each day. Start with 30 units twice daily.   Novolog is your short-acting correction insulin. Check your blood sugar prior to your meal. Then depending on your blood sugar, inject insulin as follows: BG 121 - 150: 1 units  BG 151 - 200: 2 units  BG 201 - 250: 3 units  BG 251 - 300: 5 units  BG 301 - 350: 7 units  BG 351 - 400: 9 units   Keep a journal of your blood sugar results each day. Bring to your primary care physician.

## 2017-01-26 NOTE — Progress Notes (Signed)
PROGRESS NOTE    Korynn Kenedy  ZHG:[id number] DOB: 01/21/1945 DOA: 01/24/2017 PCP: Inc, [facility]     Brief Narrative:  Nichole Munoz is a 72 y.o. [place] female with medical history significant for asthma secondary to seasonal allergies, diverticulitis in 2016, hypertension, diabetes on insulin and arthritis. Patient has been experiencing nausea, vomiting and diarrhea for 4 days.  Son explains that multiple family members with similar symptoms over the past week.  In addition to significant abdominal pain, she began reporting more generalized weakness and fatigue with associated dizziness this am so EMS was called to the home.  Patient had significant left lower quadrant abdominal pain with guarding to palpation on exam.  CT the abdomen and pelvis feels acute diverticulitis in the descending colon with a possible evolving area in the proximal sigmoid colon.  No free air or abscess formation.  Patient was admitted for further treatment with IV antibiotics.  Assessment & Plan:   Principal Problem:   Acute diverticulitis of intestine Active Problems:   CKD (chronic kidney disease) stage 3, GFR 30-59 ml/min (HCC)   DM (diabetes mellitus), type 2, uncontrolled, with renal complications (HCC)   Hypomagnesemia   Acute lower UTI   Acute kidney injury ([place])   Diabetes mellitus type 2, uncontrolled ([place])   Hypertension   Asthma due to seasonal allergies   Acute diverticulitis -Continue IV Cipro, Flagyl -Patient reported having a normal colonoscopy 2 years ago.  She did have an episode of diverticulitis back in September 2016.   Acute kidney injury on CKD stage 3  -Associated with dehydration from nausea, vomiting, diarrhea -Baseline creatinine 1.2 -Resolved with IV fluids  Uncontrolled type 2 diabetes with hyperglycemia -Son manages patient's insulin, states that he gives her Lantus 30-40 units every 6 hours.  I clarified with him multiple times that she was getting long  acting insulin multiple times daily and not Novolog or Humalog.  He states that he is a diabetic himself and has been giving her the long-acting Lantus every 6 hours due to blood sugars in the 300-400s.  And also states that patient oftentimes refuses blood sugar checks at home  -Ha1c 10.5 -Diabetic coordinator consulted for education of patient and family  UTI POA -Urine culture with >100,000 E Coli, await sensitivity  -Antibiotics as above   Essential hypertension -Hold lisinopril in setting of acute kidney injury -Blood pressure stable   DVT prophylaxis: Lovenox Code Status: Full Family Communication: Son at bedside Disposition Plan: Pending improvement, return back home 1/22 if stable and urine culture result available    Consultants:   None  Procedures:   None  Antimicrobials:  Anti-infectives (From admission, onward)   Start     Dose/Rate Route Frequency Ordered Stop   01/25/17 0000  ciprofloxacin (CIPRO) IVPB 400 mg     400 mg 200 mL/hr over 60 Minutes Intravenous Every 12 hours 01/24/17 1433     01/24/17 2000  metroNIDAZOLE (FLAGYL) IVPB 500 mg     500 mg 100 mL/hr over 60 Minutes Intravenous Every 8 hours 01/24/17 1433     01/24/17 1245  ciprofloxacin (CIPRO) IVPB 400 mg     400 mg 200 mL/hr over 60 Minutes Intravenous  Once 01/24/17 1235 01/24/17 1437   01/24/17 1245  metroNIDAZOLE (FLAGYL) IVPB 500 mg     500 mg 100 mL/hr over 60 Minutes Intravenous  Once 01/24/17 1235 01/24/17 1437       Subjective: Son is at bedside and translates.  Patient states that she feels better.  Had a small normal bowel movement today.  No further nausea, vomiting.  Tolerating meals.  She states that she her left lower quadrant abdominal pain has improved although persists.  Denies dysuria.  No fevers.  Objective: Vitals:   01/25/17 0445 01/25/17 1355 01/25/17 1940 01/26/17 0428  BP: 107/86 (!) 148/87 (!) 141/68 (!) 154/81  Pulse: 77 97 92 82  Resp: 19 17 18    Temp: 97.8 F  (36.6 C) 98.7 F (37.1 C) 98.5 F (36.9 C) 99 F (37.2 C)  TempSrc: Oral Oral Oral Oral  SpO2: 99% 99% 100% 100%  Weight:      Height:        Intake/Output Summary (Last 24 hours) at 01/26/2017 0920 Last data filed at 01/26/2017 0700 Gross per 24 hour  Intake 5153.33 ml  Output 3725 ml  Net 1428.33 ml   Filed Weights   01/24/17 0933 01/24/17 1549  Weight: 60.8 kg (134 lb) 59.9 kg (132 lb 0.9 oz)    Examination:  General exam: Appears calm and comfortable  Respiratory system: Clear to auscultation. Respiratory effort normal. Cardiovascular system: S1 & S2 heard, RRR. No JVD, murmurs, rubs, gallops or clicks. No pedal edema. Gastrointestinal system: Abdomen is nondistended, soft and TTP LLQ. No organomegaly or masses felt. Normal bowel sounds heard. Central nervous system: Alert and oriented. No focal neurological deficits. Extremities: Symmetric 5 x 5 power. Skin: No rashes, lesions or ulcers Psychiatry: Judgement and insight appear normal. Mood & affect appropriate.   Data Reviewed: I have personally reviewed following labs and imaging studies  CBC: Recent Labs  Lab 01/24/17 0956 01/25/17 0836 01/26/17 0729  WBC 11.3* 9.5 8.6  NEUTROABS 8.3*  --   --   HGB 12.7 11.6* 11.0*  HCT 35.2* 33.9* 32.0*  MCV 85.2 86.5 86.5  PLT 262 285 537   Basic Metabolic Panel: Recent Labs  Lab 01/24/17 0956 01/24/17 1536 01/25/17 0836 01/26/17 0729  NA 130*  --  138 141  K 4.7  --  3.3* 4.3  CL 96*  --  104 108  CO2 22  --  22 24  GLUCOSE 427*  --  138* 96  BUN 40*  --  26* 13  CREATININE 1.51*  --  1.28* 1.15*  CALCIUM 9.2  --  9.0 8.8*  MG  --  1.0*  --   --    GFR: Estimated Creatinine Clearance: 41.4 mL/min (A) (by C-G formula based on SCr of 1.15 mg/dL (H)). Liver Function Tests: Recent Labs  Lab 01/24/17 0956 01/25/17 0836  AST 13* 15  ALT 9* 10*  ALKPHOS 75 68  BILITOT 1.2 0.7  PROT 8.0 8.3*  ALBUMIN 3.5 3.3*   Recent Labs  Lab 01/24/17 0956  LIPASE  32   No results for input(s): AMMONIA in the last 168 hours. Coagulation Profile: No results for input(s): INR, PROTIME in the last 168 hours. Cardiac Enzymes: No results for input(s): CKTOTAL, CKMB, CKMBINDEX, TROPONINI in the last 168 hours. BNP (last 3 results) No results for input(s): PROBNP in the last 8760 hours. HbA1C: Recent Labs    01/24/17 1536  HGBA1C 10.5*   CBG: Recent Labs  Lab 01/25/17 1115 01/25/17 1625 01/25/17 2123 01/26/17 0024 01/26/17 0758  GLUCAP 296* 318* 283* 194* 90   Lipid Profile: No results for input(s): CHOL, HDL, LDLCALC, TRIG, CHOLHDL, LDLDIRECT in the last 72 hours. Thyroid Function Tests: No results for input(s): TSH, T4TOTAL, FREET4, T3FREE, THYROIDAB  in the last 72 hours. Anemia Panel: No results for input(s): VITAMINB12, FOLATE, FERRITIN, TIBC, IRON, RETICCTPCT in the last 72 hours. Sepsis Labs: No results for input(s): PROCALCITON, LATICACIDVEN in the last 168 hours.  Recent Results (from the past 240 hour(s))  Urine culture     Status: Abnormal (Preliminary result)   Collection Time: 01/24/17 12:25 PM  Result Value Ref Range Status   Specimen Description URINE, CLEAN CATCH  Final   Special Requests NONE  Final   Culture (A)  Final    >=100,000 COLONIES/mL ESCHERICHIA COLI SUSCEPTIBILITIES TO FOLLOW    Report Status PENDING  Incomplete       Radiology Studies: Ct Head Wo Contrast  Result Date: 01/24/2017 CLINICAL DATA:  Fatigue and dizziness. EXAM: CT HEAD WITHOUT CONTRAST TECHNIQUE: Contiguous axial images were obtained from the base of the skull through the vertex without intravenous contrast. COMPARISON:  September 16, 2016 FINDINGS: Brain: There is age related volume loss. There is a cavum septum pellucidum, an anatomic variant. There is no intracranial mass, hemorrhage, extra-axial fluid collection, or midline shift. 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 There  is mild small vessel disease in the centra semiovale bilaterally. Elsewhere gray-white compartments appear normal. No acute infarct is evident. Vascular: There is no appreciable hyperdense vessel. There is calcification in each carotid siphon region as well as in the distal left and right vertebral arteries. Skull: Bony calvarium appears intact. Sinuses/Orbits: There is mucosal thickening in several ethmoid air cells bilaterally. Other visualized paranasal sinuses are clear. Orbits appear symmetric bilaterally. Other: Mastoid air cells are clear. IMPRESSION: Atrophy with mild periventricular small vessel disease. 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 No acute infarct evident. No mass or hemorrhage. There are foci of arterial vascular calcification. There is mucosal thickening in several ethmoid air cells. Electronically Signed   By: Lowella Grip III M.D.   On: 01/24/2017 11:58   Ct Abdomen Pelvis W Contrast  Result Date: 01/24/2017 CLINICAL DATA:  72 year old female with history of nausea, vomiting and diarrhea with abdominal pain for the past couple of days. EXAM: CT ABDOMEN AND PELVIS WITH CONTRAST TECHNIQUE: Multidetector CT imaging of the abdomen and pelvis was performed using the standard protocol following bolus administration of intravenous contrast. CONTRAST:  22mL ISOVUE-300 IOPAMIDOL (ISOVUE-300) INJECTION 61% COMPARISON:  CT the abdomen and pelvis 07/19/2016. FINDINGS: Lower chest: Unremarkable. Hepatobiliary: No cystic or solid hepatic lesions. No intra or extrahepatic biliary ductal dilatation. Gallbladder is normal in appearance. Pancreas: No pancreatic mass. No pancreatic ductal dilatation. No pancreatic or peripancreatic fluid or inflammatory changes. Spleen: Unremarkable. Adrenals/Urinary Tract: 3 mm nonobstructive calculus in the lower pole collecting system of the right kidney. Subcentimeter low-attenuation lesion in the upper pole of the right kidney, too  small to characterize, but statistically likely a tiny cyst. Left kidney and adrenal glands are otherwise normal in appearance. No hydroureteronephrosis. Urinary bladder is normal in appearance. Stomach/Bowel: Normal appearance of the stomach. No pathologic dilatation of small bowel or colon. Numerous colonic diverticulae are noted, and in the region of the distal descending colon there is extensive mural thickening as well as extensive surrounding inflammatory changes suggestive of an acute diverticulitis. No discrete diverticular abscess or signs of frank perforation or confidently identified at this time. In addition, there is pronounced mural thickening in the proximal sigmoid colon somewhat mass-like, best appreciated on axial image 73 of series 3, where there is also some  subtle surrounding inflammatory changes. The appendix is not confidently identified and may be surgically absent. Regardless, there are no inflammatory changes noted adjacent to the cecum to suggest the presence of an acute appendicitis at this time. Vascular/Lymphatic: Aortic atherosclerosis, without evidence of aneurysm or dissection in the abdominal or pelvic vasculature. No lymphadenopathy noted in the abdomen or pelvis. Reproductive: Uterus and ovaries are unremarkable in appearance. Other: No significant volume of ascites.  No pneumoperitoneum. 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 There are no aggressive appearing lytic or blastic lesions noted in the visualized portions of the skeleton. IMPRESSION: 1. Findings are compatible with acute diverticulitis. The most obvious focus of acute inflammation is in the distal descending colon. However, there is an additional area of potential inflammation in the proximal sigmoid colon where there is also profound mural thickening. The possibility of 2 regions of acute diverticulitis should be considered. Additionally, given the mass-like mural  thickening in these regions, further evaluation with nonemergent colonoscopy should be considered after resolution of the patient's acute presentation to exclude the possibility of underlying neoplasm. 2. Aortic atherosclerosis. 3. Additional incidental findings, as above. Aortic Atherosclerosis (ICD10-I70.0). Electronically Signed   By: Vinnie Langton M.D.   On: 01/24/2017 12:30      Scheduled Meds: . enoxaparin (LOVENOX) injection  40 mg Subcutaneous Q24H  . insulin aspart  0-9 Units Subcutaneous TID WC  . insulin glargine  30 Units Subcutaneous BID  . sodium chloride flush  3 mL Intravenous Q12H   Continuous Infusions: . ciprofloxacin Stopped (01/26/17 0115)  . metronidazole Stopped (01/26/17 0606)     LOS: 2 days    Time spent: 30 minutes   Dessa Phi, DO [facility] [email or web address] Password [email or web address] 01/26/2017, 9:20 AM

## 2017-01-26 NOTE — Progress Notes (Signed)
Discharge instructions given to patient and son. Educated on medication changes and sliding scales for proper administration. Patient son verbalized understanding. Patient left unit via wheelchair with nursing staff and son in stable condition.

## 2017-01-26 NOTE — Progress Notes (Signed)
Inpatient Diabetes Program Recommendations  AACE/ADA: New Consensus Statement on Inpatient Glycemic Control (2015)  Target Ranges:  Prepandial:   less than 140 mg/dL      Peak postprandial:   less than 180 mg/dL (1-2 hours)      Critically ill patients:  140 - 180 mg/dL   Spoke with patient and son about diabetes and home regimen for diabetes control. Son reports giving patient 30 units of lantus in the am and then "seeing what glucose is in the evening and maybe giving an additional 10-12 units." Son reports giving patient this adjusted dose maybe 4 times a week. Patient has not seen her PCP in a year due to son's busy schedule. Paatient has follow up appointment next month on the 13 th with PCP. Spoke with them about patient's Current A1c level. Son reports he is also a DM and his glucose is 500. Son reports he's a Diabetic because " he likes sweets"   Discussed glucose and A1C goals. Discussed importance of checking CBGs and maintaining good CBG control to prevent long-term and short-term complications. Explained how hyperglycemia leads to damage within blood vessels which lead to the common complications seen with uncontrolled diabetes. Stressed to the patient the importance of improving glycemic control to prevent further complications from uncontrolled diabetes.   Discussed impact of nutrition, exercise, stress, sickness, and medications on diabetes control. Discussed carbohydrates, carbohydrate goals per day and meal, along with portion sizes. Discussed with son how the patietn's current medications are ordered (Lantus BID). Patient and son verbalized understanding of information discussed and have no further questions at this time related to diabetes.   Thanks,  Tama Headings RN, MSN, [facility] Inpatient Diabetes Coordinator Team Pager [phone] (8a-5p)

## 2017-01-26 NOTE — Discharge Summary (Addendum)
Physician Discharge Summary  Nichole Munoz DHR:[id number] DOB: 11/16/1945 DOA: 01/24/2017  PCP: [facility]  Admit date: 01/24/2017 Discharge date: 01/26/2017  Admitted From: Home Disposition:  Home   Recommendations for Outpatient Follow-up:  1. Follow up with PCP in 1 week 2. May require repeat colonoscopy once acute diverticulitis resolves, follow-up with primary care physician. 3. Please obtain Mg in 1 week   Discharge Condition: Stable CODE STATUS: Full  Diet recommendation: Carb modified   Brief/Interim Summary: Nichole Munoz a 72 y.o. [place]fem[place]ewith medical history significant forasthma secondary to seasonal allergies, diverticulitis in 2016, hypertension, diabetes on insulin and arthritis. Patient has been experiencing nausea,vomiting and diarrhea for 4 days. Son explains that multiple family members with similar symptoms over the past week. In addition to significant abdominal pain,she began reporting more generalized weakness and fatigue with associated dizziness this amso EMS was called to the home. Patient had significant left lower quadrant abdominal pain with guarding to palpation on exam. CT the abdomen and pelvis feels acute diverticulitis in the descending colon with a possible evolving area in the proximal sigmoid colon. No free air or abscess formation.  Patient was admitted for further treatment with IV antibiotics.  She was initially treated with IV Cipro, Flagyl.  Her abdominal pain, nausea, vomiting, diarrhea slowly improved.  Creatinine returned back to her baseline with IV fluids.  Diet was advanced without issue.  Her and her family met with diabetic coordinator to discuss uncontrolled type 2 diabetes with hyperglycemia.  Urine culture resulted with E. coli and her antibiotic was de-escalated to oral Vantin, Flagyl prior to discharge to cover diverticulitis as well as UTI.  Discharge Diagnoses:  Principal Problem:   Acute  diverticulitis of intestine Active Problems:   CKD (chronic kidney disease) stage 3, GFR 30-59 ml/min (HCC)   DM (diabetes mellitus), type 2, uncontrolled, with renal complications (HCC)   Hypomagnesemia   Acute lower UTI   Acute kidney injury ([place])   Diabetes mellitus type 2, uncontrolled ([place])   Hypertension   Asthma due to seasonal allergies  Acute diverticulitis -Initially treated with IV Cipro, Flagyl --> PO Vantin, Flagyl on discharge  -Patient reported having a normal colonoscopy 2 years ago.  She did have an episode of diverticulitis back in September 2016.   Acute kidney injury on CKD stage 3  -Associated with dehydration from nausea, vomiting, diarrhea -Baseline creatinine 1.2 -Resolved with IV fluids  Uncontrolled type 2 diabetes with hyperglycemia -Son manages patient's insulin, states that he gives her Lantus 30-40 units every 6 hours.  I clarified with him multiple times that she was getting long acting insulin multiple times daily and not Novolog or Humalog.  He states that he is a diabetic himself and has been giving her the long-acting Lantus every 6 hours due to blood sugars in the 300-400s.  And also states that patient oftentimes refuses blood sugar checks at home  -Ha1c 10.5 -Diabetic coordinator met with patient and family for education  E Coli UTI POA -Antibiotic was escalated to [facility]  Essential hypertension -Hold lisinopril in setting of acute kidney injury.  May resume as outpatient. -Blood pressure stable  Hypomagnesemia Replace, trend   Discharge Instructions  Discharge Instructions    Call MD for:  difficulty breathing, headache or visual disturbances   Complete by:  As directed    Call MD for:  extreme fatigue   Complete by:  As directed    Call MD for:  hives   Complete  by:  As directed    Call MD for:  persistant dizziness or light-headedness   Complete by:  As directed    Call MD for:  persistant nausea and vomiting   Complete by:   As directed    Call MD for:  severe uncontrolled pain   Complete by:  As directed    Call MD for:  temperature >100.4   Complete by:  As directed    Diet Carb Modified   Complete by:  As directed    Discharge instructions   Complete by:  As directed    You were cared for by a hospitalist during your hospital stay. If you have any questions about your discharge medications or the care you received while you were in the hospital after you are discharged, you can call the unit and asked to speak with the hospitalist on call if the hospitalist that took care of you is not available. Once you are discharged, your primary care physician will handle any further medical issues. Please note that NO REFILLS for any discharge medications will be authorized once you are discharged, as it is imperative that you return to your primary care physician (or establish a relationship with a primary care physician if you do not have one) for your aftercare needs so that they can reassess your need for medications and monitor your lab values.   Increase activity slowly   Complete by:  As directed      Allergies as of 01/26/2017      Reactions   Other Other (See Comments)   Chicken causes itching/  clorox, Bleach causes shortness of breath   No Known Allergies    Previous listing of unable to tolerate SMELL of Clorox and Chicken REACTION NOT AN ALLERGY       Medication List    STOP taking these medications   HYDROcodone-acetaminophen 5-325 MG tablet Commonly known as:  NORCO/VICODIN     TAKE these medications   albuterol 108 (90 Base) MCG/ACT inhaler Commonly known as:  PROVENTIL HFA;VENTOLIN HFA Inhale 2 puffs into the lungs every 6 (six) hours as needed for wheezing or shortness of breath.   blood glucose meter kit and supplies Dispense based on patient and insurance preference. Use up to four times daily as directed. (FOR ICD-10 E10.9, E11.9).   cefpodoxime 200 MG tablet Commonly known as:   VANTIN Take 1 tablet (200 mg total) by mouth every 12 (twelve) hours for 4 days.   cetirizine 10 MG tablet Commonly known as:  ZYRTEC Take 0.5 tablets (5 mg total) by mouth daily.   gabapentin 300 MG capsule Commonly known as:  NEURONTIN Take 1 capsule (300 mg total) by mouth 3 (three) times daily.   glipiZIDE 10 MG 24 hr tablet Commonly known as:  GLUCOTROL XL Take 1 tablet (10 mg total) by mouth every morning. What changed:  when to take this   hydrocortisone 2.5 % rectal cream Commonly known as:  ANUSOL-HC Place rectally 3 (three) times daily.   insulin aspart 100 UNIT/ML FlexPen Commonly known as:  NOVOLOG FLEXPEN Sliding scale: 121 - 150: 1 units, 151 - 200: 2 units, 201 - 250: 3 units, 251 - 300: 5 units, 301 - 350: 7 units, 351 - 400: 9 units   Insulin Glargine 100 UNIT/ML Solostar Pen Commonly known as:  LANTUS SOLOSTAR Inject 30 Units into the skin 2 (two) times daily. What changed:  how much to take   Insulin Pen Needle 31G X  5 MM Misc Use up to 4 times daily with insulin   lisinopril 10 MG tablet Commonly known as:  PRINIVIL,ZESTRIL Take 10 mg by mouth 2 (two) times daily.   metFORMIN 850 MG tablet Commonly known as:  GLUCOPHAGE Take 850 mg by mouth 2 (two) times daily with a meal.   metroNIDAZOLE 500 MG tablet Commonly known as:  FLAGYL Take 1 tablet (500 mg total) by mouth every 8 (eight) hours for 4 days.   multivitamin with minerals Tabs tablet Take 1 tablet by mouth once a week.   Vitamin D (Ergocalciferol) 50000 units Caps capsule Commonly known as:  DRISDOL Take 50,000 Units by mouth every Tuesday.      [place], [facility]. Schedule an appointment as soon as possible for a visit in 1 week(s).   Contact information: [place] [id number] [phone]          Allergies  Allergen Reactions  . Other Other (See Comments)    Chicken causes itching/  clorox, Bleach causes  shortness of breath  . No Known Allergies     Previous listing of unable to tolerate SMELL of Clorox and Chicken REACTION NOT AN ALLERGY     Consultations:  None   Procedures/Studies: Ct Head Wo Contrast  Result Date: 01/24/2017 CLINICAL DATA:  Fatigue and dizziness. EXAM: CT HEAD WITHOUT CONTRAST TECHNIQUE: Contiguous axial images were obtained from the base of the skull through the vertex without intravenous contrast. COMPARISON:  September 16, 2016 FINDINGS: Brain: There is age related volume loss. There is a cavum septum pellucidum, an anatomic variant. There is no intracranial mass, hemorrhage, extra-axial fluid collection, or midline shift. 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 There is mild small vessel disease in the centra semiovale bilaterally. Elsewhere gray-white compartments appear normal. No acute infarct is evident. Vascular: There is no appreciable hyperdense vessel. There is calcification in each carotid siphon region as well as in the distal left and right vertebral arteries. Skull: Bony calvarium appears intact. Sinuses/Orbits: There is mucosal thickening in several ethmoid air cells bilaterally. Other visualized paranasal sinuses are clear. Orbits appear symmetric bilaterally. Other: Mastoid air cells are clear. IMPRESSION: Atrophy with mild periventricular small vessel disease. 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 No acute infarct evident. No mass or hemorrhage. There are foci of arterial vascular calcification. There is mucosal thickening in several ethmoid air cells. Electronically Signed   By: Lowella Grip III M.D.   On: 01/24/2017 11:58   Ct Abdomen Pelvis W Contrast  Result Date: 01/24/2017 CLINICAL DATA:  72 year old female with history of nausea, vomiting and diarrhea with abdominal pain for the past couple of days. EXAM: CT ABDOMEN AND PELVIS WITH CONTRAST TECHNIQUE:  Multidetector CT imaging of the abdomen and pelvis was performed using the standard protocol following bolus administration of intravenous contrast. CONTRAST:  29m ISOVUE-300 IOPAMIDOL (ISOVUE-300) INJECTION 61% COMPARISON:  CT the abdomen and pelvis 07/19/2016. FINDINGS: Lower chest: Unremarkable. Hepatobiliary: No cystic or solid hepatic lesions. No intra or extrahepatic biliary ductal dilatation. Gallbladder is normal in appearance. Pancreas: No pancreatic mass. No pancreatic ductal dilatation. No pancreatic or peripancreatic fluid or inflammatory changes. Spleen: Unremarkable. Adrenals/Urinary Tract: 3 mm nonobstructive calculus in the lower pole collecting system of the right kidney. Subcentimeter low-attenuation lesion in the upper pole of the right kidney, too small to characterize, but statistically likely a  tiny cyst. Left kidney and adrenal glands are otherwise normal in appearance. No hydroureteronephrosis. Urinary bladder is normal in appearance. Stomach/Bowel: Normal appearance of the stomach. No pathologic dilatation of small bowel or colon. Numerous colonic diverticulae are noted, and in the region of the distal descending colon there is extensive mural thickening as well as extensive surrounding inflammatory changes suggestive of an acute diverticulitis. No discrete diverticular abscess or signs of frank perforation or confidently identified at this time. In addition, there is pronounced mural thickening in the proximal sigmoid colon somewhat mass-like, best appreciated on axial image 73 of series 3, where there is also some subtle surrounding inflammatory changes. The appendix is not confidently identified and may be surgically absent. Regardless, there are no inflammatory changes noted adjacent to the cecum to suggest the presence of an acute appendicitis at this time. Vascular/Lymphatic: Aortic atherosclerosis, without evidence of aneurysm or dissection in the abdominal or pelvic vasculature. No  lymphadenopathy noted in the abdomen or pelvis. Reproductive: Uterus and ovaries are unremarkable in appearance. Other: No significant volume of ascites.  No pneumoperitoneum. 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 There are no aggressive appearing lytic or blastic lesions noted in the visualized portions of the skeleton. IMPRESSION: 1. Findings are compatible with acute diverticulitis. The most obvious focus of acute inflammation is in the distal descending colon. However, there is an additional area of potential inflammation in the proximal sigmoid colon where there is also profound mural thickening. The possibility of 2 regions of acute diverticulitis should be considered. Additionally, given the mass-like mural thickening in these regions, further evaluation with nonemergent colonoscopy should be considered after resolution of the patient's acute presentation to exclude the possibility of underlying neoplasm. 2. Aortic atherosclerosis. 3. Additional incidental findings, as above. Aortic Atherosclerosis (ICD10-I70.0). Electronically Signed   By: Vinnie Langton M.D.   On: 01/24/2017 12:30      Discharge Exam: Vitals:   01/25/17 1940 01/26/17 0428  BP: (!) 141/68 (!) 154/81  Pulse: 92 82  Resp: 18   Temp: 98.5 F (36.9 C) 99 F (37.2 C)  SpO2: 100% 100%   Vitals:   01/25/17 0445 01/25/17 1355 01/25/17 1940 01/26/17 0428  BP: 107/86 (!) 148/87 (!) 141/68 (!) 154/81  Pulse: 77 97 92 82  Resp: '19 17 18   '$ Temp: 97.8 F (36.6 C) 98.7 F (37.1 C) 98.5 F (36.9 C) 99 F (37.2 C)  TempSrc: Oral Oral Oral Oral  SpO2: 99% 99% 100% 100%  Weight:      Height:        General: Pt is alert, awake, not in acute distress Cardiovascular: RRR, S1/S2 +, no rubs, no gallops Respiratory: CTA bilaterally, no wheezing, no rhonchi Abdominal: Soft, mildly TTP LLQ, ND, bowel sounds + Extremities: no edema, no cyanosis    The results of significant  diagnostics from this hospitalization (including imaging, microbiology, ancillary and laboratory) are listed below for reference.     Microbiology: Recent Results (from the past 240 hour(s))  Urine culture     Status: Abnormal   Collection Time: 01/24/17 12:25 PM  Result Value Ref Range Status   Specimen Description URINE, CLEAN CATCH  Final   Special Requests NONE  Final   Culture >=100,000 COLONIES/mL ESCHERICHIA COLI (A)  Final   Report Status 01/26/2017 FINAL  Final   Organism ID, Bacteria ESCHERICHIA COLI (A)  Final      Susceptibility   Escherichia coli - MIC*  AMPICILLIN >=32 RESISTANT Resistant     CEFAZOLIN <=4 SENSITIVE Sensitive     CEFTRIAXONE <=1 SENSITIVE Sensitive     CIPROFLOXACIN >=4 RESISTANT Resistant     GENTAMICIN <=1 SENSITIVE Sensitive     IMIPENEM <=0.25 SENSITIVE Sensitive     NITROFURANTOIN <=16 SENSITIVE Sensitive     TRIMETH/SULFA <=20 SENSITIVE Sensitive     AMPICILLIN/SULBACTAM 16 INTERMEDIATE Intermediate     PIP/TAZO <=4 SENSITIVE Sensitive     Extended ESBL NEGATIVE Sensitive     * >=100,000 COLONIES/mL ESCHERICHIA COLI     Labs: BNP (last 3 results) No results for input(s): BNP in the last 8760 hours. Basic Metabolic Panel: Recent Labs  Lab 01/24/17 0956 01/24/17 1536 01/25/17 0836 01/26/17 0729  NA 130*  --  138 141  K 4.7  --  3.3* 4.3  CL 96*  --  104 108  CO2 22  --  22 24  GLUCOSE 427*  --  138* 96  BUN 40*  --  26* 13  CREATININE 1.51*  --  1.28* 1.15*  CALCIUM 9.2  --  9.0 8.8*  MG  --  1.0*  --  1.2*   Liver Function Tests: Recent Labs  Lab 01/24/17 0956 01/25/17 0836  AST 13* 15  ALT 9* 10*  ALKPHOS 75 68  BILITOT 1.2 0.7  PROT 8.0 8.3*  ALBUMIN 3.5 3.3*   Recent Labs  Lab 01/24/17 0956  LIPASE 32   No results for input(s): AMMONIA in the last 168 hours. CBC: Recent Labs  Lab 01/24/17 0956 01/25/17 0836 01/26/17 0729  WBC 11.3* 9.5 8.6  NEUTROABS 8.3*  --   --   HGB 12.7 11.6* 11.0*  HCT 35.2*  33.9* 32.0*  MCV 85.2 86.5 86.5  PLT 262 285 267   Cardiac Enzymes: No results for input(s): CKTOTAL, CKMB, CKMBINDEX, TROPONINI in the last 168 hours. BNP: Invalid input(s): POCBNP CBG: Recent Labs  Lab 01/25/17 1625 01/25/17 2123 01/26/17 0024 01/26/17 0758 01/26/17 1210  GLUCAP 318* 283* 194* 90 231*   D-Dimer No results for input(s): DDIMER in the last 72 hours. Hgb A1c Recent Labs    01/24/17 1536  HGBA1C 10.5*   Lipid Profile No results for input(s): CHOL, HDL, LDLCALC, TRIG, CHOLHDL, LDLDIRECT in the last 72 hours. Thyroid function studies No results for input(s): TSH, T4TOTAL, T3FREE, THYROIDAB in the last 72 hours.  Invalid input(s): FREET3 Anemia work up No results for input(s): VITAMINB12, FOLATE, FERRITIN, TIBC, IRON, RETICCTPCT in the last 72 hours. Urinalysis    Component Value Date/Time   COLORURINE YELLOW 01/24/2017 0956   APPEARANCEUR CLEAR 01/24/2017 0956   LABSPEC 1.015 01/24/2017 0956   PHURINE 6.0 01/24/2017 0956   GLUCOSEU >=500 (A) 01/24/2017 0956   HGBUR NEGATIVE 01/24/2017 0956   BILIRUBINUR NEGATIVE 01/24/2017 0956   KETONESUR 5 (A) 01/24/2017 0956   PROTEINUR NEGATIVE 01/24/2017 0956   UROBILINOGEN 1.0 09/23/2014 1458   NITRITE POSITIVE (A) 01/24/2017 0956   LEUKOCYTESUR TRACE (A) 01/24/2017 0956   Sepsis Labs Invalid input(s): PROCALCITONIN,  WBC,  LACTICIDVEN Microbiology Recent Results (from the past 240 hour(s))  Urine culture     Status: Abnormal   Collection Time: 01/24/17 12:25 PM  Result Value Ref Range Status   Specimen Description URINE, CLEAN CATCH  Final   Special Requests NONE  Final   Culture >=100,000 COLONIES/mL ESCHERICHIA COLI (A)  Final   Report Status 01/26/2017 FINAL  Final   Organism ID, Bacteria ESCHERICHIA COLI (A)  Final  Susceptibility   Escherichia coli - MIC*    AMPICILLIN >=32 RESISTANT Resistant     CEFAZOLIN <=4 SENSITIVE Sensitive     CEFTRIAXONE <=1 SENSITIVE Sensitive     CIPROFLOXACIN  >=4 RESISTANT Resistant     GENTAMICIN <=1 SENSITIVE Sensitive     IMIPENEM <=0.25 SENSITIVE Sensitive     NITROFURANTOIN <=16 SENSITIVE Sensitive     TRIMETH/SULFA <=20 SENSITIVE Sensitive     AMPICILLIN/SULBACTAM 16 INTERMEDIATE Intermediate     PIP/TAZO <=4 SENSITIVE Sensitive     Extended ESBL NEGATIVE Sensitive     * >=100,000 COLONIES/mL ESCHERICHIA COLI     Time coordinating discharge: 40 minutes  SIGNED:  Dessa Phi, DO [facility] Pager [phone]  If 7PM-7AM, please contact night-coverage [email or web address] Password [email or web address] 01/26/2017, 1:00 PM

## 2017-03-24 ENCOUNTER — Emergency Department (HOSPITAL_COMMUNITY)
Admission: EM | Admit: 2017-03-24 | Discharge: 2017-03-24 | Disposition: A | Discharge status: Home / Self Care | Payer: Medicare Other | Attending: Emergency Medicine | Admitting: Emergency Medicine

## 2017-03-24 ENCOUNTER — Encounter (HOSPITAL_COMMUNITY): Payer: Self-pay | Admitting: *Deleted

## 2017-03-24 ENCOUNTER — Other Ambulatory Visit: Payer: Self-pay

## 2017-03-24 ENCOUNTER — Emergency Department (HOSPITAL_COMMUNITY): Payer: Medicare Other

## 2017-03-24 DIAGNOSIS — I129 Hypertensive chronic kidney disease with stage 1 through stage 4 chronic kidney disease, or unspecified chronic kidney disease: Secondary | ICD-10-CM | POA: Insufficient documentation

## 2017-03-24 DIAGNOSIS — N183 Chronic kidney disease, stage 3 (moderate): Secondary | ICD-10-CM | POA: Insufficient documentation

## 2017-03-24 DIAGNOSIS — E1122 Type 2 diabetes mellitus with diabetic chronic kidney disease: Secondary | ICD-10-CM | POA: Diagnosis not present

## 2017-03-24 DIAGNOSIS — Z79899 Other long term (current) drug therapy: Secondary | ICD-10-CM | POA: Diagnosis not present

## 2017-03-24 DIAGNOSIS — M25512 Pain in left shoulder: Secondary | ICD-10-CM | POA: Diagnosis not present

## 2017-03-24 DIAGNOSIS — J45909 Unspecified asthma, uncomplicated: Secondary | ICD-10-CM | POA: Insufficient documentation

## 2017-03-24 DIAGNOSIS — E1140 Type 2 diabetes mellitus with diabetic neuropathy, unspecified: Secondary | ICD-10-CM | POA: Diagnosis not present

## 2017-03-24 DIAGNOSIS — Z794 Long term (current) use of insulin: Secondary | ICD-10-CM | POA: Diagnosis not present

## 2017-03-24 MED ORDER — TRAMADOL HCL 50 MG PO TABS: 50.0000 mg | ORAL_TABLET | Freq: Four times a day (QID) | ORAL | 0 refills | Status: DC | PRN

## 2017-03-24 MED ORDER — OXYCODONE-ACETAMINOPHEN 5-325 MG PO TABS
1.0000 | ORAL_TABLET | ORAL | Status: DC | PRN
  Administered 2017-03-24: 1 via ORAL
  Filled 2017-03-24: qty 1

## 2017-03-24 MED ORDER — METHOCARBAMOL 500 MG PO TABS: 500.0000 mg | ORAL_TABLET | Freq: Two times a day (BID) | ORAL | 0 refills | Status: DC | PRN

## 2017-03-24 NOTE — Discharge Instructions (Signed)
It was my pleasure taking care of you today!   Robaxin is your muscle relaxer to take as needed.  Tylenol as needed for mild to moderate pain. Tramadol is to take only as needed for severe pain - This can make you very drowsy - please do not drink alcohol, operate heavy machinery or drive on this medication.   Please follow up with the orthopedic doctor listed for further evaluation of shoulder pain.   Return to ER for chest pain, shortness of breath, new or worsening symptoms, any additional concerns.

## 2017-03-24 NOTE — ED Triage Notes (Signed)
Pt reports having left shoulder pain x 3 days. Denies injury.

## 2017-03-24 NOTE — ED Notes (Signed)
Patient Alert and oriented to baseline. Stable and ambulatory to baseline. Patient verbalized understanding of the discharge instructions.  Patient belongings were taken by the patient.   
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
## 2017-06-01 ENCOUNTER — Emergency Department (HOSPITAL_COMMUNITY)
Admission: EM | Admit: 2017-06-01 | Discharge: 2017-06-01 | Discharge status: Absent without leave | Payer: Medicare Other

## 2017-08-07 ENCOUNTER — Encounter (HOSPITAL_COMMUNITY): Payer: Self-pay

## 2017-08-07 ENCOUNTER — Other Ambulatory Visit: Payer: Self-pay

## 2017-08-07 ENCOUNTER — Emergency Department (HOSPITAL_COMMUNITY)
Admission: EM | Admit: 2017-08-07 | Discharge: 2017-08-08 | Disposition: A | Discharge status: Home / Self Care | Payer: Medicare Other | Attending: Emergency Medicine | Admitting: Emergency Medicine

## 2017-08-07 DIAGNOSIS — E1100 Type 2 diabetes mellitus with hyperosmolarity without nonketotic hyperglycemic-hyperosmolar coma (NKHHC): Secondary | ICD-10-CM | POA: Insufficient documentation

## 2017-08-07 DIAGNOSIS — E1140 Type 2 diabetes mellitus with diabetic neuropathy, unspecified: Secondary | ICD-10-CM | POA: Insufficient documentation

## 2017-08-07 DIAGNOSIS — I129 Hypertensive chronic kidney disease with stage 1 through stage 4 chronic kidney disease, or unspecified chronic kidney disease: Secondary | ICD-10-CM | POA: Diagnosis not present

## 2017-08-07 DIAGNOSIS — J45909 Unspecified asthma, uncomplicated: Secondary | ICD-10-CM | POA: Insufficient documentation

## 2017-08-07 DIAGNOSIS — R0602 Shortness of breath: Secondary | ICD-10-CM | POA: Diagnosis not present

## 2017-08-07 DIAGNOSIS — Z794 Long term (current) use of insulin: Secondary | ICD-10-CM | POA: Diagnosis not present

## 2017-08-07 DIAGNOSIS — N183 Chronic kidney disease, stage 3 (moderate): Secondary | ICD-10-CM | POA: Insufficient documentation

## 2017-08-07 DIAGNOSIS — R631 Polydipsia: Secondary | ICD-10-CM | POA: Diagnosis not present

## 2017-08-07 DIAGNOSIS — R55 Syncope and collapse: Secondary | ICD-10-CM | POA: Diagnosis not present

## 2017-08-07 DIAGNOSIS — R5383 Other fatigue: Secondary | ICD-10-CM | POA: Diagnosis not present

## 2017-08-07 DIAGNOSIS — R42 Dizziness and giddiness: Secondary | ICD-10-CM | POA: Diagnosis not present

## 2017-08-07 DIAGNOSIS — R358 Other polyuria: Secondary | ICD-10-CM | POA: Insufficient documentation

## 2017-08-07 DIAGNOSIS — R739 Hyperglycemia, unspecified: Secondary | ICD-10-CM

## 2017-08-07 DIAGNOSIS — E1122 Type 2 diabetes mellitus with diabetic chronic kidney disease: Secondary | ICD-10-CM | POA: Insufficient documentation

## 2017-08-07 DIAGNOSIS — Z79899 Other long term (current) drug therapy: Secondary | ICD-10-CM | POA: Diagnosis not present

## 2017-08-07 DIAGNOSIS — R638 Other symptoms and signs concerning food and fluid intake: Secondary | ICD-10-CM | POA: Diagnosis not present

## 2017-08-07 LAB — BASIC METABOLIC PANEL
ANION GAP: 11 (ref 5–15)
BUN: 22 mg/dL (ref 8–23)
CALCIUM: 9.2 mg/dL (ref 8.9–10.3)
CO2: 29 mmol/L (ref 22–32)
Chloride: 95 mmol/L — ABNORMAL LOW (ref 98–111)
Creatinine, Ser: 1.14 mg/dL — ABNORMAL HIGH (ref 0.44–1.00)
GFR calc Af Amer: 54 mL/min — ABNORMAL LOW (ref >60)
GFR, EST NON AFRICAN AMERICAN: 47 mL/min — AB (ref >60)
GLUCOSE: 600 mg/dL — AB (ref 70–99)
POTASSIUM: 4.7 mmol/L (ref 3.5–5.1)
Sodium: 135 mmol/L (ref 135–145)

## 2017-08-07 LAB — URINALYSIS, ROUTINE W REFLEX MICROSCOPIC
BILIRUBIN URINE: NEGATIVE
Glucose, UA: >=500 mg/dL — AB
Hgb urine dipstick: NEGATIVE
KETONES UR: NEGATIVE mg/dL
Nitrite: NEGATIVE
PH: 8 (ref 5.0–8.0)
Protein, ur: NEGATIVE mg/dL
Specific Gravity, Urine: 1.021 (ref 1.005–1.030)

## 2017-08-07 LAB — CBG MONITORING, ED
GLUCOSE-CAPILLARY: 562 mg/dL — AB (ref 70–99)
GLUCOSE-CAPILLARY: 369 mg/dL — AB (ref 70–99)
Glucose-Capillary: 304 mg/dL — ABNORMAL HIGH (ref 70–99)
Glucose-Capillary: >600 mg/dL (ref 70–99)

## 2017-08-07 LAB — CBC WITH DIFFERENTIAL/PLATELET
Basophils Absolute: 0 10*3/uL (ref 0.0–0.1)
Basophils Relative: 1 %
EOS PCT: 8 %
Eosinophils Absolute: 0.6 10*3/uL (ref 0.0–0.7)
HCT: 35.8 % — ABNORMAL LOW (ref 36.0–46.0)
Hemoglobin: 12.6 g/dL (ref 12.0–15.0)
LYMPHS ABS: 2.6 10*3/uL (ref 0.7–4.0)
LYMPHS PCT: 36 %
MCH: 30.2 pg (ref 26.0–34.0)
MCHC: 35.2 g/dL (ref 30.0–36.0)
MCV: 85.9 fL (ref 78.0–100.0)
MONO ABS: 0.2 10*3/uL (ref 0.1–1.0)
MONOS PCT: 3 %
Neutro Abs: 3.8 10*3/uL (ref 1.7–7.7)
Neutrophils Relative %: 52 %
PLATELETS: 199 10*3/uL (ref 150–400)
RBC: 4.17 MIL/uL (ref 3.87–5.11)
RDW: 11.7 % (ref 11.5–15.5)
WBC: 7.2 10*3/uL (ref 4.0–10.5)

## 2017-08-07 LAB — MAGNESIUM: MAGNESIUM: 1.5 mg/dL — AB (ref 1.7–2.4)

## 2017-08-07 MED ORDER — SODIUM CHLORIDE 0.9 % IV SOLN
INTRAVENOUS | Status: DC
  Administered 2017-08-07: 5 [IU]/h via INTRAVENOUS
  Administered 2017-08-08: 4.9 [IU]/h via INTRAVENOUS
  Filled 2017-08-07: qty 1

## 2017-08-07 MED ORDER — SODIUM CHLORIDE 0.9 % IV BOLUS
1000.0000 mL | Freq: Once | INTRAVENOUS | Status: AC
  Administered 2017-08-07: 1000 mL via INTRAVENOUS

## 2017-08-07 MED ORDER — MAGNESIUM SULFATE IN D5W 1-5 GM/100ML-% IV SOLN
1.0000 g | Freq: Once | INTRAVENOUS | Status: AC
  Administered 2017-08-07: 1 g via INTRAVENOUS
  Filled 2017-08-07: qty 100

## 2017-08-07 MED ORDER — DEXTROSE-NACL 5-0.45 % IV SOLN: INTRAVENOUS | Status: DC

## 2017-08-07 MED ORDER — MAGNESIUM SULFATE 50 % IJ SOLN: 1.0000 g | Freq: Once | INTRAMUSCULAR | Status: DC

## 2017-08-07 MED ORDER — SODIUM CHLORIDE 0.9 % IV SOLN
INTRAVENOUS | Status: DC
  Administered 2017-08-07 (×2): via INTRAVENOUS

## 2017-08-07 NOTE — ED Notes (Signed)
Bed: BF38 Expected date:  Expected time:  Means of arrival:  Comments: 72 yo hyperglycemia

## 2017-08-07 NOTE — ED Provider Notes (Signed)
Pt signed out to me by B. Rona Ravens, PA-C.  Please see previous notes for further history.  In brief, patient presenting with hyperglycemia for the past several weeks, symptoms of polyuria, polydipsia.  Today, patient's blood glucose was over 600, is normally between 200 and 300.  Currently patient is on glucose stabilizer, glucose is improving but still in the 300s.  Will reassess at midnight to see if patient needs admission for further glucose management or whether she is stable to go home.  On reevaluation, glucose improved to 245.  Discussed with patient and son.  Discussed continued treatment with her home medication of insulin.  Discussed importance of cessation of adding sugar to drink such as coffee.  Discussed importance of close follow-up with primary care.  Patient to return if blood sugars elevated again.  At this time, patient proceed for discharge.  Return precautions given.  Patient and son state they understand and agree to plan.     Franchot Heidelberg, PA-C 08/08/17 0217    Shanon Rosser, MD 08/08/17 [phone]
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
## 2017-08-07 NOTE — ED Triage Notes (Signed)
Pt arrives via EMS-- CBG reading high >600 at home. C/o weakness and "shakiness. A/Ox4. 574mL NaCl given en route.

## 2017-08-08 DIAGNOSIS — E1100 Type 2 diabetes mellitus with hyperosmolarity without nonketotic hyperglycemic-hyperosmolar coma (NKHHC): Secondary | ICD-10-CM | POA: Diagnosis not present

## 2017-08-08 LAB — CBG MONITORING, ED: GLUCOSE-CAPILLARY: 245 mg/dL — AB (ref 70–99)

## 2017-08-08 NOTE — Discharge Instructions (Addendum)
Take insulin as prescribed. Stop eating sugar.  Follow up with a primary care doctor on Monday or as soon as possible.  Return to the ER if your blood sugars increase again, or with any new or concerning symptoms.

## 2017-10-28 ENCOUNTER — Ambulatory Visit: Payer: Medicare Other | Admitting: Nurse Practitioner

## 2018-02-16 ENCOUNTER — Ambulatory Visit: Payer: Medicare Other | Admitting: Nurse Practitioner

## 2018-02-18 ENCOUNTER — Emergency Department (HOSPITAL_COMMUNITY): Payer: Medicare Other

## 2018-02-18 ENCOUNTER — Other Ambulatory Visit: Payer: Self-pay

## 2018-02-18 ENCOUNTER — Inpatient Hospital Stay (HOSPITAL_COMMUNITY)
Admission: EM | Admit: 2018-02-18 | Discharge: 2018-02-21 | DRG: 392 | Disposition: A | Discharge status: Home / Self Care | Payer: Medicare Other | Attending: Family Medicine | Admitting: Family Medicine

## 2018-02-18 DIAGNOSIS — Z87440 Personal history of urinary (tract) infections: Secondary | ICD-10-CM

## 2018-02-18 DIAGNOSIS — N1330 Unspecified hydronephrosis: Secondary | ICD-10-CM | POA: Diagnosis present

## 2018-02-18 DIAGNOSIS — K5732 Diverticulitis of large intestine without perforation or abscess without bleeding: Principal | ICD-10-CM | POA: Diagnosis present

## 2018-02-18 DIAGNOSIS — N183 Chronic kidney disease, stage 3 unspecified: Secondary | ICD-10-CM | POA: Diagnosis present

## 2018-02-18 DIAGNOSIS — K7460 Unspecified cirrhosis of liver: Secondary | ICD-10-CM | POA: Diagnosis present

## 2018-02-18 DIAGNOSIS — E1169 Type 2 diabetes mellitus with other specified complication: Secondary | ICD-10-CM | POA: Diagnosis present

## 2018-02-18 DIAGNOSIS — N132 Hydronephrosis with renal and ureteral calculous obstruction: Secondary | ICD-10-CM | POA: Diagnosis present

## 2018-02-18 DIAGNOSIS — R739 Hyperglycemia, unspecified: Secondary | ICD-10-CM

## 2018-02-18 DIAGNOSIS — Z79891 Long term (current) use of opiate analgesic: Secondary | ICD-10-CM | POA: Diagnosis not present

## 2018-02-18 DIAGNOSIS — E785 Hyperlipidemia, unspecified: Secondary | ICD-10-CM | POA: Diagnosis present

## 2018-02-18 DIAGNOSIS — K5792 Diverticulitis of intestine, part unspecified, without perforation or abscess without bleeding: Secondary | ICD-10-CM | POA: Diagnosis not present

## 2018-02-18 DIAGNOSIS — E871 Hypo-osmolality and hyponatremia: Secondary | ICD-10-CM | POA: Diagnosis present

## 2018-02-18 DIAGNOSIS — E1140 Type 2 diabetes mellitus with diabetic neuropathy, unspecified: Secondary | ICD-10-CM | POA: Diagnosis present

## 2018-02-18 DIAGNOSIS — Z79899 Other long term (current) drug therapy: Secondary | ICD-10-CM | POA: Diagnosis not present

## 2018-02-18 DIAGNOSIS — E1122 Type 2 diabetes mellitus with diabetic chronic kidney disease: Secondary | ICD-10-CM | POA: Diagnosis present

## 2018-02-18 DIAGNOSIS — J45909 Unspecified asthma, uncomplicated: Secondary | ICD-10-CM | POA: Diagnosis present

## 2018-02-18 DIAGNOSIS — E1129 Type 2 diabetes mellitus with other diabetic kidney complication: Secondary | ICD-10-CM | POA: Diagnosis present

## 2018-02-18 DIAGNOSIS — E1165 Type 2 diabetes mellitus with hyperglycemia: Secondary | ICD-10-CM | POA: Diagnosis present

## 2018-02-18 DIAGNOSIS — I129 Hypertensive chronic kidney disease with stage 1 through stage 4 chronic kidney disease, or unspecified chronic kidney disease: Secondary | ICD-10-CM | POA: Diagnosis present

## 2018-02-18 DIAGNOSIS — N200 Calculus of kidney: Secondary | ICD-10-CM

## 2018-02-18 DIAGNOSIS — E119 Type 2 diabetes mellitus without complications: Secondary | ICD-10-CM | POA: Diagnosis present

## 2018-02-18 DIAGNOSIS — Z833 Family history of diabetes mellitus: Secondary | ICD-10-CM | POA: Diagnosis not present

## 2018-02-18 DIAGNOSIS — N1831 Chronic kidney disease, stage 3a: Secondary | ICD-10-CM | POA: Diagnosis present

## 2018-02-18 DIAGNOSIS — I10 Essential (primary) hypertension: Secondary | ICD-10-CM | POA: Diagnosis present

## 2018-02-18 DIAGNOSIS — Z794 Long term (current) use of insulin: Secondary | ICD-10-CM

## 2018-02-18 LAB — CBC WITH DIFFERENTIAL/PLATELET
Abs Immature Granulocytes: 0.05 10*3/uL (ref 0.00–0.07)
Basophils Absolute: 0 10*3/uL (ref 0.0–0.1)
Basophils Relative: 0 %
EOS PCT: 4 %
Eosinophils Absolute: 0.4 10*3/uL (ref 0.0–0.5)
HEMATOCRIT: 35.5 % — AB (ref 36.0–46.0)
HEMOGLOBIN: 11.9 g/dL — AB (ref 12.0–15.0)
Immature Granulocytes: 1 %
LYMPHS ABS: 3 10*3/uL (ref 0.7–4.0)
LYMPHS PCT: 30 %
MCH: 29.9 pg (ref 26.0–34.0)
MCHC: 33.5 g/dL (ref 30.0–36.0)
MCV: 89.2 fL (ref 80.0–100.0)
MONOS PCT: 5 %
Monocytes Absolute: 0.5 10*3/uL (ref 0.1–1.0)
Neutro Abs: 6.1 10*3/uL (ref 1.7–7.7)
Neutrophils Relative %: 60 %
Platelets: 197 10*3/uL (ref 150–400)
RBC: 3.98 MIL/uL (ref 3.87–5.11)
RDW: 11.4 % — ABNORMAL LOW (ref 11.5–15.5)
WBC: 10.1 10*3/uL (ref 4.0–10.5)
nRBC: 0 % (ref 0.0–0.2)

## 2018-02-18 LAB — TROPONIN I: Troponin I: <0.03 ng/mL (ref <0.03)

## 2018-02-18 LAB — URINALYSIS, ROUTINE W REFLEX MICROSCOPIC
Bilirubin Urine: NEGATIVE
Glucose, UA: >=500 mg/dL — AB
Hgb urine dipstick: NEGATIVE
Ketones, ur: NEGATIVE mg/dL
LEUKOCYTE UA: NEGATIVE
Nitrite: NEGATIVE
Protein, ur: NEGATIVE mg/dL
Specific Gravity, Urine: 1.016 (ref 1.005–1.030)
pH: 7 (ref 5.0–8.0)

## 2018-02-18 LAB — CBG MONITORING, ED
Glucose-Capillary: 402 mg/dL — ABNORMAL HIGH (ref 70–99)
Glucose-Capillary: 246 mg/dL — ABNORMAL HIGH (ref 70–99)
Glucose-Capillary: 249 mg/dL — ABNORMAL HIGH (ref 70–99)

## 2018-02-18 LAB — COMPREHENSIVE METABOLIC PANEL
ALK PHOS: 75 U/L (ref 38–126)
ALT: 11 U/L (ref 0–44)
ANION GAP: 7 (ref 5–15)
AST: 15 U/L (ref 15–41)
Albumin: 3.5 g/dL (ref 3.5–5.0)
BILIRUBIN TOTAL: 0.3 mg/dL (ref 0.3–1.2)
BUN: 25 mg/dL — ABNORMAL HIGH (ref 8–23)
CALCIUM: 8.6 mg/dL — AB (ref 8.9–10.3)
CO2: 27 mmol/L (ref 22–32)
Chloride: 97 mmol/L — ABNORMAL LOW (ref 98–111)
Creatinine, Ser: 1.24 mg/dL — ABNORMAL HIGH (ref 0.44–1.00)
GFR calc non Af Amer: 43 mL/min — ABNORMAL LOW (ref >60)
GFR, EST AFRICAN AMERICAN: 50 mL/min — AB (ref >60)
GLUCOSE: 477 mg/dL — AB (ref 70–99)
Potassium: 4.9 mmol/L (ref 3.5–5.1)
Sodium: 131 mmol/L — ABNORMAL LOW (ref 135–145)
TOTAL PROTEIN: 7.1 g/dL (ref 6.5–8.1)

## 2018-02-18 LAB — LIPASE, BLOOD: Lipase: 48 U/L (ref 11–51)

## 2018-02-18 MED ORDER — SODIUM CHLORIDE 0.9 % IV SOLN
2.0000 g | Freq: Once | INTRAVENOUS | Status: AC
  Administered 2018-02-18: 2 g via INTRAVENOUS
  Filled 2018-02-18: qty 20

## 2018-02-18 MED ORDER — METRONIDAZOLE IN NACL 5-0.79 MG/ML-% IV SOLN
500.0000 mg | Freq: Once | INTRAVENOUS | Status: AC
  Administered 2018-02-18: 500 mg via INTRAVENOUS
  Filled 2018-02-18: qty 100

## 2018-02-18 MED ORDER — SODIUM CHLORIDE 0.9 % IV BOLUS
1000.0000 mL | Freq: Once | INTRAVENOUS | Status: AC
  Administered 2018-02-18: 1000 mL via INTRAVENOUS

## 2018-02-18 MED ORDER — SODIUM CHLORIDE (PF) 0.9 % IJ SOLN
INTRAMUSCULAR | Status: AC
  Administered 2018-02-18: 20:00:00
  Filled 2018-02-18: qty 50

## 2018-02-18 MED ORDER — IOHEXOL 300 MG/ML  SOLN
75.0000 mL | Freq: Once | INTRAMUSCULAR | Status: AC | PRN
  Administered 2018-02-18: 75 mL via INTRAVENOUS
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
## 2018-02-18 NOTE — ED Notes (Signed)
ED Provider at bedside. 

## 2018-02-18 NOTE — ED Notes (Signed)
Patient transported to X-ray 
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
## 2018-02-19 ENCOUNTER — Encounter (HOSPITAL_COMMUNITY): Payer: Self-pay

## 2018-02-19 DIAGNOSIS — K5792 Diverticulitis of intestine, part unspecified, without perforation or abscess without bleeding: Secondary | ICD-10-CM

## 2018-02-19 LAB — GLUCOSE, CAPILLARY
GLUCOSE-CAPILLARY: 265 mg/dL — AB (ref 70–99)
Glucose-Capillary: 161 mg/dL — ABNORMAL HIGH (ref 70–99)
Glucose-Capillary: 178 mg/dL — ABNORMAL HIGH (ref 70–99)
Glucose-Capillary: 75 mg/dL (ref 70–99)
Glucose-Capillary: 113 mg/dL — ABNORMAL HIGH (ref 70–99)

## 2018-02-19 LAB — COMPREHENSIVE METABOLIC PANEL
ALT: 10 U/L (ref 0–44)
AST: 12 U/L — ABNORMAL LOW (ref 15–41)
Albumin: 2.9 g/dL — ABNORMAL LOW (ref 3.5–5.0)
Alkaline Phosphatase: 57 U/L (ref 38–126)
Anion gap: 8 (ref 5–15)
BUN: 19 mg/dL (ref 8–23)
CO2: 24 mmol/L (ref 22–32)
CREATININE: 1.05 mg/dL — AB (ref 0.44–1.00)
Calcium: 8 mg/dL — ABNORMAL LOW (ref 8.9–10.3)
Chloride: 103 mmol/L (ref 98–111)
GFR calc Af Amer: >60 mL/min (ref >60)
GFR, EST NON AFRICAN AMERICAN: 53 mL/min — AB (ref >60)
Glucose, Bld: 266 mg/dL — ABNORMAL HIGH (ref 70–99)
Potassium: 4.2 mmol/L (ref 3.5–5.1)
Sodium: 135 mmol/L (ref 135–145)
Total Bilirubin: 0.7 mg/dL (ref 0.3–1.2)
Total Protein: 6.4 g/dL — ABNORMAL LOW (ref 6.5–8.1)

## 2018-02-19 LAB — CBC
HCT: 32.3 % — ABNORMAL LOW (ref 36.0–46.0)
Hemoglobin: 10.6 g/dL — ABNORMAL LOW (ref 12.0–15.0)
MCH: 30 pg (ref 26.0–34.0)
MCHC: 32.8 g/dL (ref 30.0–36.0)
MCV: 91.5 fL (ref 80.0–100.0)
Platelets: 186 10*3/uL (ref 150–400)
RBC: 3.53 MIL/uL — ABNORMAL LOW (ref 3.87–5.11)
RDW: 11.5 % (ref 11.5–15.5)
WBC: 11.3 10*3/uL — ABNORMAL HIGH (ref 4.0–10.5)
nRBC: 0 % (ref 0.0–0.2)

## 2018-02-19 MED ORDER — INSULIN ASPART 100 UNIT/ML ~~LOC~~ SOLN
0.0000 [IU] | Freq: Every day | SUBCUTANEOUS | Status: DC
  Administered 2018-02-19: 3 [IU] via SUBCUTANEOUS

## 2018-02-19 MED ORDER — GABAPENTIN 300 MG PO CAPS
300.0000 mg | ORAL_CAPSULE | Freq: Three times a day (TID) | ORAL | Status: DC
  Administered 2018-02-19 – 2018-02-21 (×7): 300 mg via ORAL
  Filled 2018-02-19 (×7): qty 1

## 2018-02-19 MED ORDER — ALBUTEROL SULFATE (2.5 MG/3ML) 0.083% IN NEBU
2.5000 mg | INHALATION_SOLUTION | RESPIRATORY_TRACT | Status: DC | PRN
  Administered 2018-02-20: 2.5 mg via RESPIRATORY_TRACT
  Filled 2018-02-19: qty 3

## 2018-02-19 MED ORDER — LORATADINE 10 MG PO TABS
10.0000 mg | ORAL_TABLET | Freq: Every day | ORAL | Status: DC
  Administered 2018-02-19 – 2018-02-21 (×3): 10 mg via ORAL
  Filled 2018-02-19 (×3): qty 1

## 2018-02-19 MED ORDER — INSULIN ASPART 100 UNIT/ML ~~LOC~~ SOLN
0.0000 [IU] | Freq: Three times a day (TID) | SUBCUTANEOUS | Status: DC
  Administered 2018-02-19 – 2018-02-21 (×3): 3 [IU] via SUBCUTANEOUS

## 2018-02-19 MED ORDER — ALBUTEROL SULFATE HFA 108 (90 BASE) MCG/ACT IN AERS: 2.0000 | INHALATION_SPRAY | Freq: Four times a day (QID) | RESPIRATORY_TRACT | Status: DC | PRN

## 2018-02-19 MED ORDER — METRONIDAZOLE IN NACL 5-0.79 MG/ML-% IV SOLN
500.0000 mg | Freq: Three times a day (TID) | INTRAVENOUS | Status: DC
  Administered 2018-02-19 – 2018-02-21 (×7): 500 mg via INTRAVENOUS
  Filled 2018-02-19 (×7): qty 100

## 2018-02-19 MED ORDER — LISINOPRIL 10 MG PO TABS
10.0000 mg | ORAL_TABLET | Freq: Two times a day (BID) | ORAL | Status: DC
  Administered 2018-02-19 (×2): 10 mg via ORAL
  Filled 2018-02-19 (×2): qty 1

## 2018-02-19 MED ORDER — SODIUM CHLORIDE 0.9 % IV SOLN
2.0000 g | INTRAVENOUS | Status: DC
  Administered 2018-02-19 – 2018-02-20 (×2): 2 g via INTRAVENOUS
  Filled 2018-02-19 (×3): qty 20

## 2018-02-19 MED ORDER — ONDANSETRON HCL 4 MG PO TABS: 4.0000 mg | ORAL_TABLET | Freq: Four times a day (QID) | ORAL | Status: DC | PRN

## 2018-02-19 MED ORDER — HEPARIN SODIUM (PORCINE) 5000 UNIT/ML IJ SOLN
5000.0000 [IU] | Freq: Three times a day (TID) | INTRAMUSCULAR | Status: DC
  Administered 2018-02-19 – 2018-02-21 (×8): 5000 [IU] via SUBCUTANEOUS
  Filled 2018-02-19 (×8): qty 1

## 2018-02-19 MED ORDER — ONDANSETRON HCL 4 MG/2ML IJ SOLN: 4.0000 mg | Freq: Four times a day (QID) | INTRAMUSCULAR | Status: DC | PRN

## 2018-02-19 MED ORDER — GLIPIZIDE ER 5 MG PO TB24
10.0000 mg | ORAL_TABLET | Freq: Every day | ORAL | Status: DC
  Administered 2018-02-19 – 2018-02-21 (×3): 10 mg via ORAL
  Filled 2018-02-19 (×3): qty 2

## 2018-02-19 MED ORDER — SODIUM CHLORIDE 0.9 % IV SOLN
INTRAVENOUS | Status: DC
  Administered 2018-02-19 – 2018-02-20 (×4): via INTRAVENOUS

## 2018-02-19 MED ORDER — METFORMIN HCL 850 MG PO TABS
850.0000 mg | ORAL_TABLET | Freq: Two times a day (BID) | ORAL | Status: DC
  Administered 2018-02-19 – 2018-02-21 (×5): 850 mg via ORAL
  Filled 2018-02-19 (×5): qty 1

## 2018-02-19 MED ORDER — INSULIN GLARGINE 100 UNIT/ML ~~LOC~~ SOLN
30.0000 [IU] | Freq: Two times a day (BID) | SUBCUTANEOUS | Status: DC
  Administered 2018-02-19 – 2018-02-21 (×6): 30 [IU] via SUBCUTANEOUS
  Filled 2018-02-19 (×6): qty 0.3
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
## 2018-02-20 LAB — BASIC METABOLIC PANEL
Anion gap: 9 (ref 5–15)
BUN: 17 mg/dL (ref 8–23)
CO2: 22 mmol/L (ref 22–32)
Calcium: 8.1 mg/dL — ABNORMAL LOW (ref 8.9–10.3)
Chloride: 110 mmol/L (ref 98–111)
Creatinine, Ser: 1.19 mg/dL — ABNORMAL HIGH (ref 0.44–1.00)
GFR calc Af Amer: 52 mL/min — ABNORMAL LOW (ref >60)
GFR calc non Af Amer: 45 mL/min — ABNORMAL LOW (ref >60)
Glucose, Bld: 75 mg/dL (ref 70–99)
Potassium: 4 mmol/L (ref 3.5–5.1)
Sodium: 141 mmol/L (ref 135–145)

## 2018-02-20 LAB — GLUCOSE, CAPILLARY
GLUCOSE-CAPILLARY: 86 mg/dL (ref 70–99)
Glucose-Capillary: 84 mg/dL (ref 70–99)
Glucose-Capillary: 78 mg/dL (ref 70–99)
Glucose-Capillary: 230 mg/dL — ABNORMAL HIGH (ref 70–99)

## 2018-02-20 LAB — CBC WITH DIFFERENTIAL/PLATELET
Abs Immature Granulocytes: 0.02 10*3/uL (ref 0.00–0.07)
Basophils Absolute: 0.1 10*3/uL (ref 0.0–0.1)
Basophils Relative: 1 %
Eosinophils Absolute: 0.4 10*3/uL (ref 0.0–0.5)
Eosinophils Relative: 5 %
HEMATOCRIT: 33.2 % — AB (ref 36.0–46.0)
Hemoglobin: 10.8 g/dL — ABNORMAL LOW (ref 12.0–15.0)
Immature Granulocytes: 0 %
Lymphocytes Relative: 34 %
Lymphs Abs: 2.9 10*3/uL (ref 0.7–4.0)
MCH: 29.3 pg (ref 26.0–34.0)
MCHC: 32.5 g/dL (ref 30.0–36.0)
MCV: 90 fL (ref 80.0–100.0)
Monocytes Absolute: 0.5 10*3/uL (ref 0.1–1.0)
Monocytes Relative: 6 %
Neutro Abs: 4.7 10*3/uL (ref 1.7–7.7)
Neutrophils Relative %: 54 %
Platelets: 188 10*3/uL (ref 150–400)
RBC: 3.69 MIL/uL — ABNORMAL LOW (ref 3.87–5.11)
RDW: 11.7 % (ref 11.5–15.5)
WBC: 8.5 10*3/uL (ref 4.0–10.5)
nRBC: 0 % (ref 0.0–0.2)

## 2018-02-20 NOTE — Plan of Care (Signed)
Pt is stable . No signs of acute distress.
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
## 2018-02-21 LAB — CBC WITH DIFFERENTIAL/PLATELET
Abs Immature Granulocytes: 0.05 10*3/uL (ref 0.00–0.07)
Basophils Absolute: 0 10*3/uL (ref 0.0–0.1)
Basophils Relative: 1 %
Eosinophils Absolute: 0.5 10*3/uL (ref 0.0–0.5)
Eosinophils Relative: 7 %
HCT: 32.8 % — ABNORMAL LOW (ref 36.0–46.0)
Hemoglobin: 10.8 g/dL — ABNORMAL LOW (ref 12.0–15.0)
Immature Granulocytes: 1 %
LYMPHS PCT: 39 %
Lymphs Abs: 2.6 10*3/uL (ref 0.7–4.0)
MCH: 30 pg (ref 26.0–34.0)
MCHC: 32.9 g/dL (ref 30.0–36.0)
MCV: 91.1 fL (ref 80.0–100.0)
Monocytes Absolute: 0.5 10*3/uL (ref 0.1–1.0)
Monocytes Relative: 7 %
NEUTROS ABS: 3 10*3/uL (ref 1.7–7.7)
Neutrophils Relative %: 45 %
Platelets: 190 10*3/uL (ref 150–400)
RBC: 3.6 MIL/uL — ABNORMAL LOW (ref 3.87–5.11)
RDW: 11.7 % (ref 11.5–15.5)
WBC: 6.6 10*3/uL (ref 4.0–10.5)
nRBC: 0 % (ref 0.0–0.2)

## 2018-02-21 LAB — GLUCOSE, CAPILLARY: Glucose-Capillary: 167 mg/dL — ABNORMAL HIGH (ref 70–99)

## 2018-02-21 LAB — URINE CULTURE: Culture: >=100000 — AB

## 2018-02-21 LAB — BASIC METABOLIC PANEL
Anion gap: 8 (ref 5–15)
BUN: 14 mg/dL (ref 8–23)
CO2: 23 mmol/L (ref 22–32)
CREATININE: 1.11 mg/dL — AB (ref 0.44–1.00)
Calcium: 7.9 mg/dL — ABNORMAL LOW (ref 8.9–10.3)
Chloride: 109 mmol/L (ref 98–111)
GFR calc Af Amer: 57 mL/min — ABNORMAL LOW (ref >60)
GFR calc non Af Amer: 49 mL/min — ABNORMAL LOW (ref >60)
Glucose, Bld: 201 mg/dL — ABNORMAL HIGH (ref 70–99)
Potassium: 4 mmol/L (ref 3.5–5.1)
Sodium: 140 mmol/L (ref 135–145)

## 2018-02-21 MED ORDER — AMOXICILLIN-POT CLAVULANATE 875-125 MG PO TABS
1.0000 | ORAL_TABLET | Freq: Two times a day (BID) | ORAL | Status: DC
  Administered 2018-02-21: 1 via ORAL
  Filled 2018-02-21: qty 1

## 2018-02-21 MED ORDER — AMOXICILLIN-POT CLAVULANATE 875-125 MG PO TABS: 1.0000 | ORAL_TABLET | Freq: Two times a day (BID) | ORAL | 0 refills | Status: DC

## 2018-02-21 MED ORDER — ALBUTEROL SULFATE HFA 108 (90 BASE) MCG/ACT IN AERS: 2.0000 | INHALATION_SPRAY | Freq: Four times a day (QID) | RESPIRATORY_TRACT | 3 refills | Status: AC | PRN

## 2018-02-21 MED ORDER — INSULIN GLARGINE 100 UNIT/ML SOLOSTAR PEN: 30.0000 [IU] | PEN_INJECTOR | Freq: Two times a day (BID) | SUBCUTANEOUS | 0 refills | Status: DC

## 2018-02-21 MED ORDER — GABAPENTIN 300 MG PO CAPS: 300.0000 mg | ORAL_CAPSULE | Freq: Three times a day (TID) | ORAL | 3 refills | Status: DC

## 2018-02-21 NOTE — Progress Notes (Signed)
Patient discharged to home w/ son. Given all belongings, instructions. Patient and son verbalized understanding of taking antibiotics and monitoring blood sugar. Instructed to follow up w/ pcp. Escorted to pov via w/c.
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
## 2018-02-21 NOTE — Plan of Care (Signed)
  Problem: Clinical Measurements: Goal: Ability to maintain clinical measurements within normal limits will improve Outcome: Progressing   Problem: Activity: Goal: Risk for activity intolerance will decrease Outcome: Progressing   Problem: Nutrition: Goal: Adequate nutrition will be maintained Outcome: Progressing   Problem: Safety: Goal: Ability to remain free from injury will improve Outcome: Progressing   

## 2018-04-26 IMAGING — DX DG CHEST 2V
2 series · 2 of 2 positions shown · non-contrast
Comparison: 06/11/2015

CLINICAL DATA: Right and left arm pain.  Chest pain.

EXAM:
CHEST  2 VIEW

[chest pa]
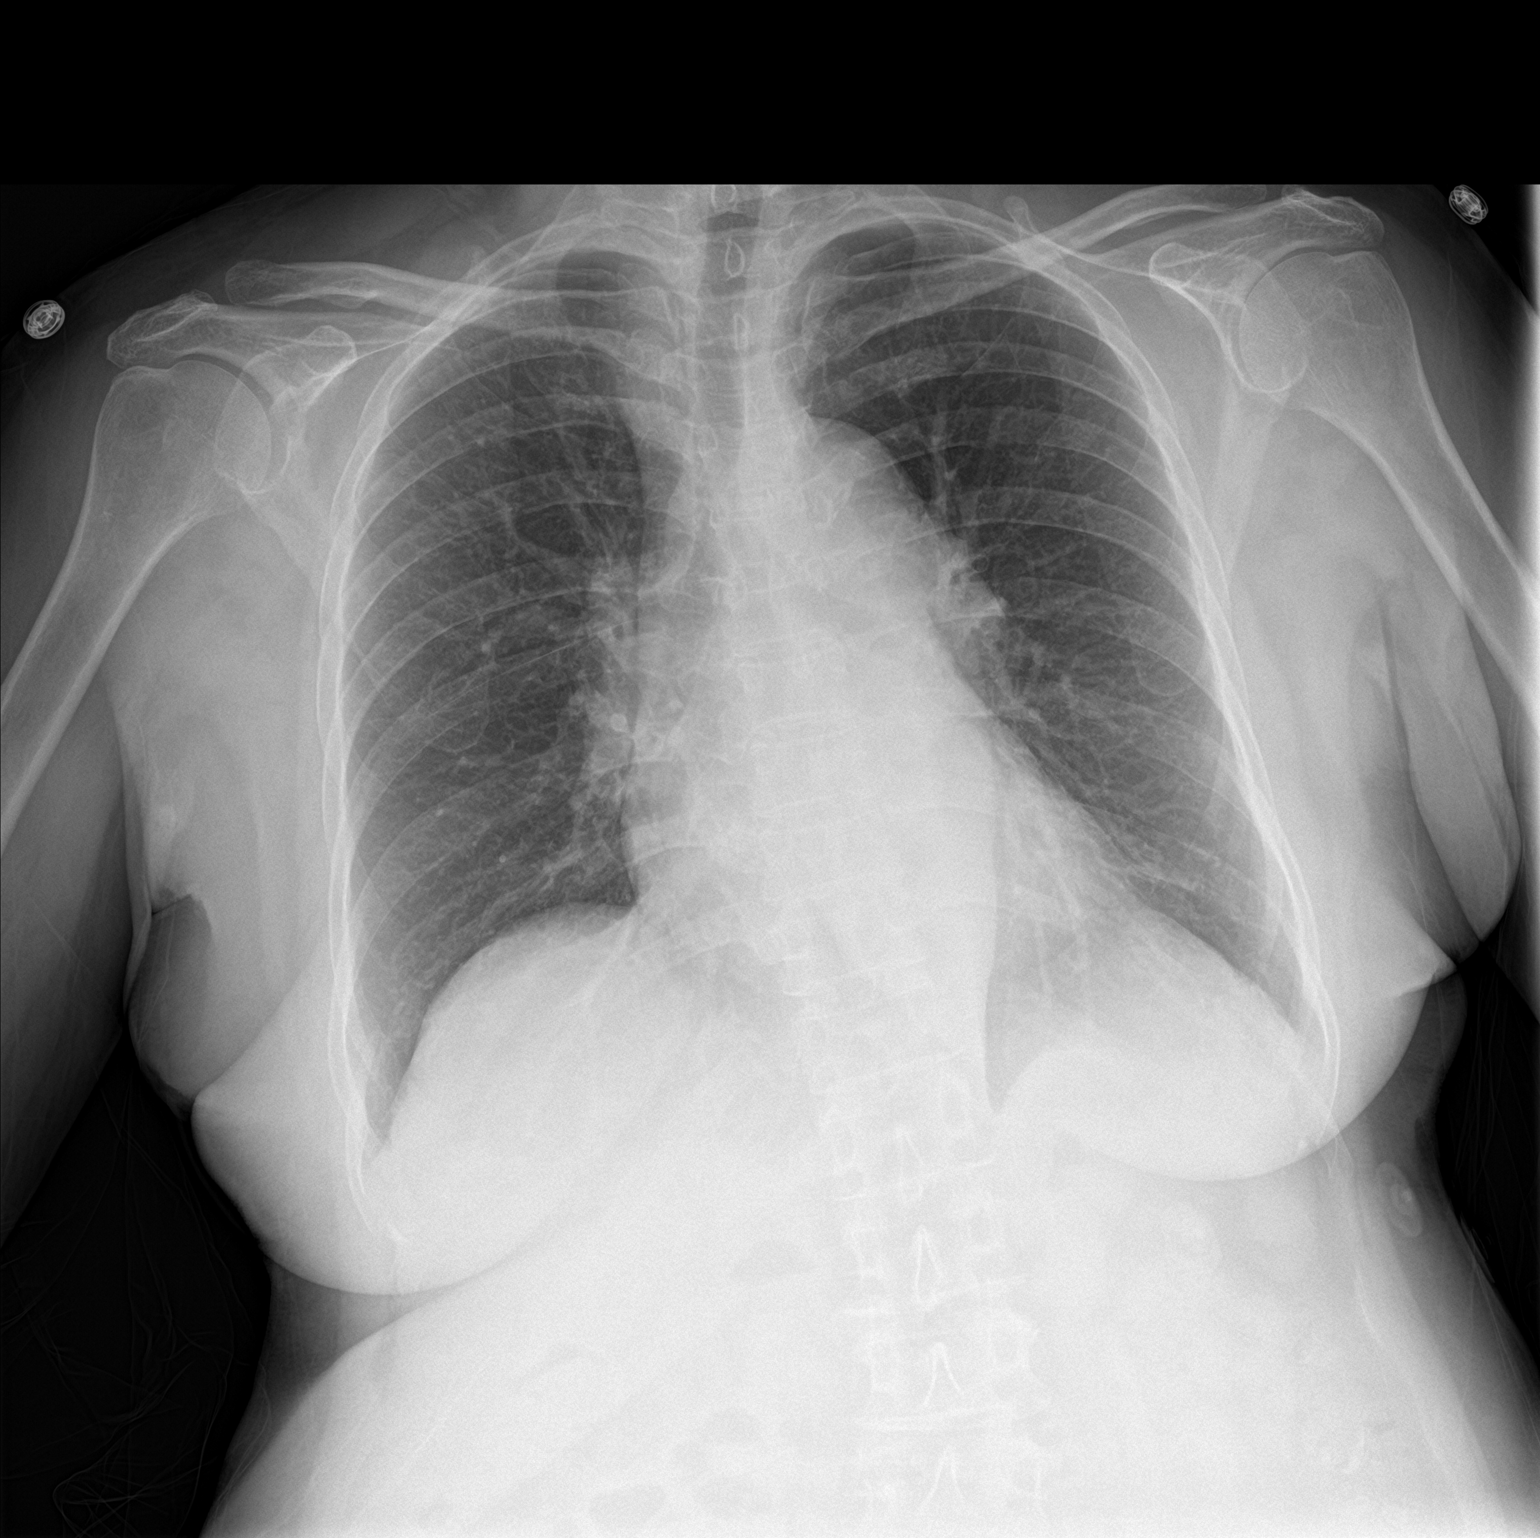

[chest lat]
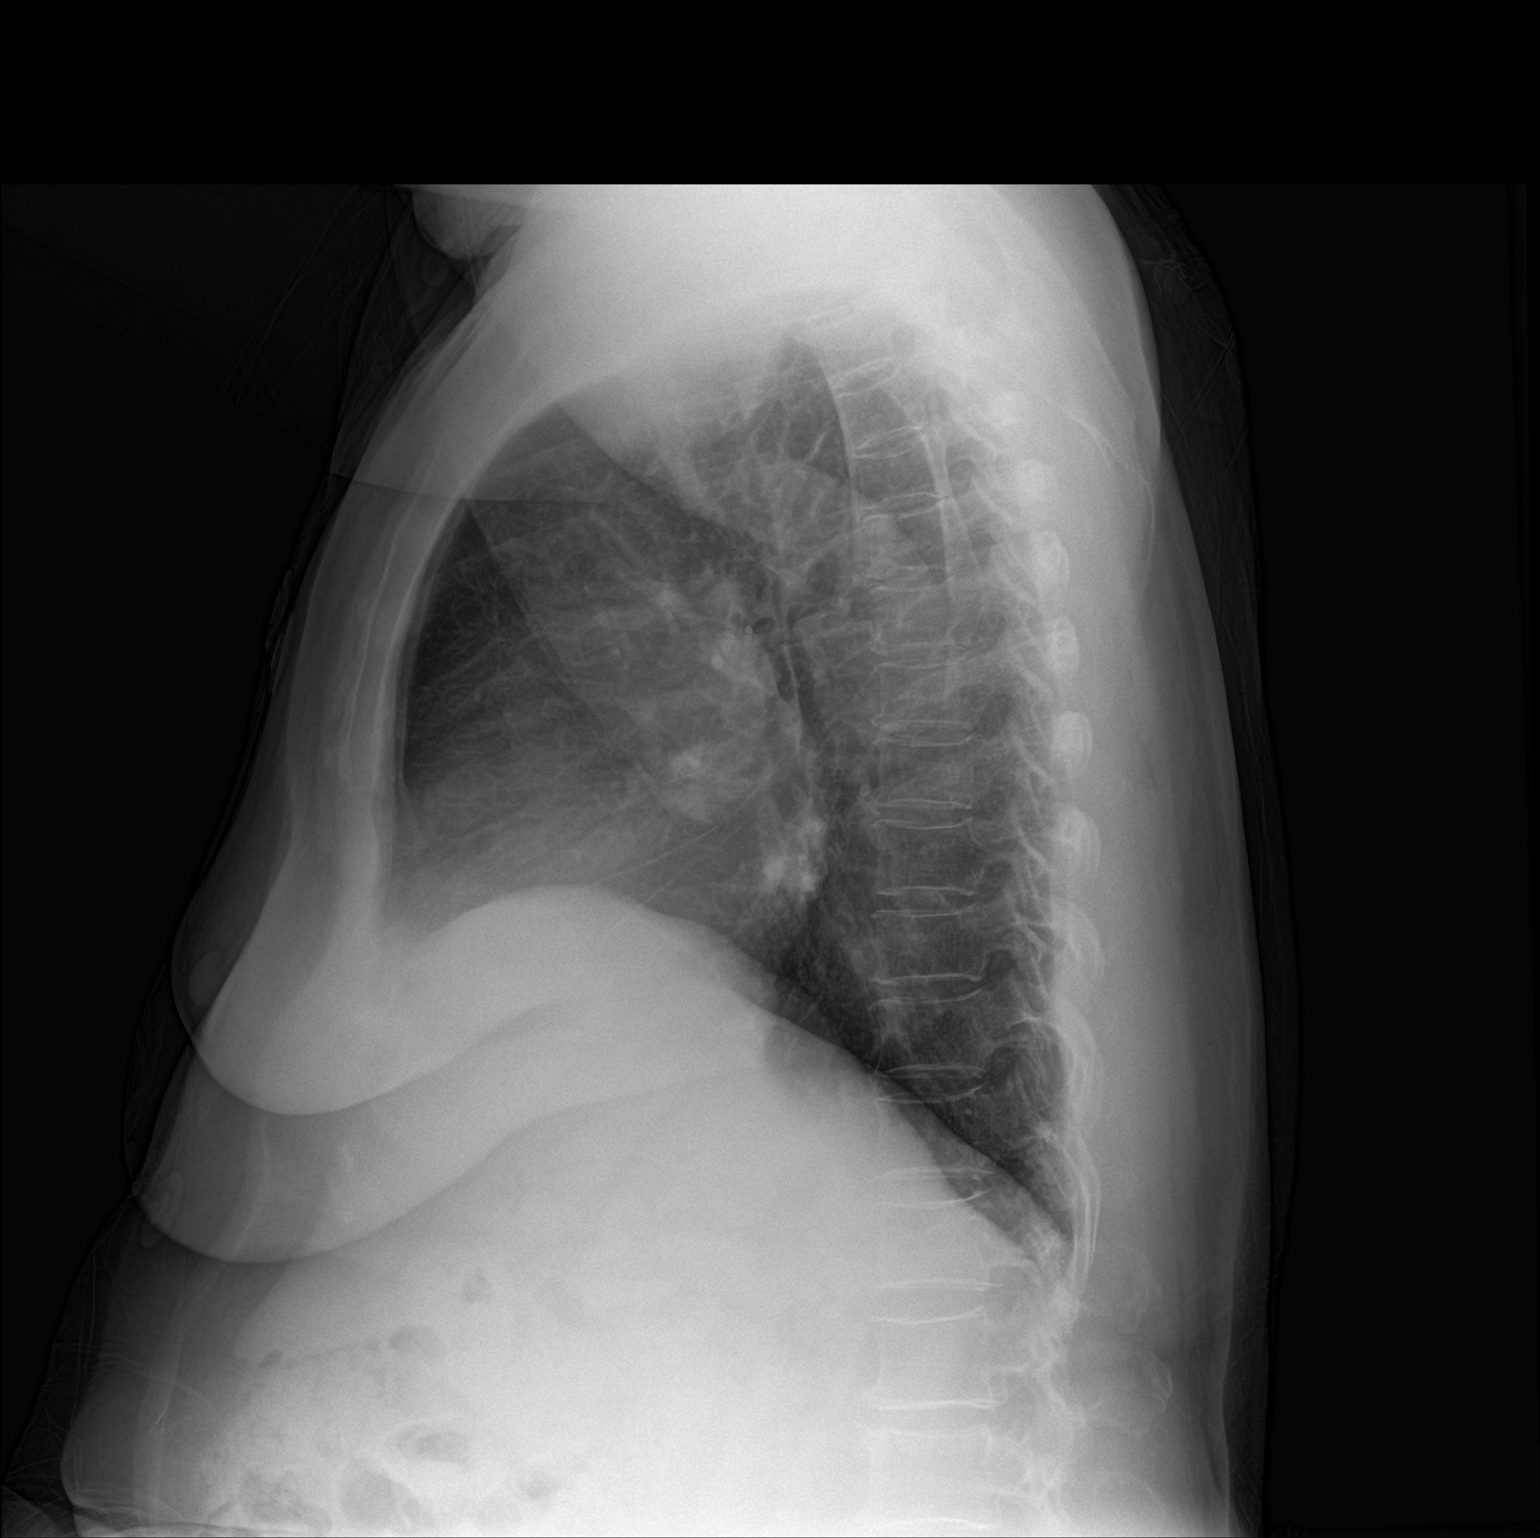

[2 of 2 positions shown; findings below may reference images not displayed]

FINDINGS: The heart size and mediastinal contours are within normal limits.
Both lungs are clear. The visualized skeletal structures are
unremarkable.
IMPRESSION: No active cardiopulmonary disease.

## 2019-01-22 ENCOUNTER — Encounter (HOSPITAL_COMMUNITY): Payer: Self-pay | Admitting: Emergency Medicine

## 2019-01-22 ENCOUNTER — Emergency Department (HOSPITAL_COMMUNITY)
Admission: EM | Admit: 2019-01-22 | Discharge: 2019-01-22 | Disposition: A | Discharge status: Home / Self Care | Payer: Medicare Other | Attending: Emergency Medicine | Admitting: Emergency Medicine

## 2019-01-22 ENCOUNTER — Emergency Department (HOSPITAL_COMMUNITY): Payer: Medicare Other

## 2019-01-22 DIAGNOSIS — J45909 Unspecified asthma, uncomplicated: Secondary | ICD-10-CM | POA: Diagnosis not present

## 2019-01-22 DIAGNOSIS — I129 Hypertensive chronic kidney disease with stage 1 through stage 4 chronic kidney disease, or unspecified chronic kidney disease: Secondary | ICD-10-CM | POA: Diagnosis not present

## 2019-01-22 DIAGNOSIS — M25512 Pain in left shoulder: Secondary | ICD-10-CM | POA: Insufficient documentation

## 2019-01-22 DIAGNOSIS — W010XXA Fall on same level from slipping, tripping and stumbling without subsequent striking against object, initial encounter: Secondary | ICD-10-CM | POA: Insufficient documentation

## 2019-01-22 DIAGNOSIS — Z79899 Other long term (current) drug therapy: Secondary | ICD-10-CM | POA: Insufficient documentation

## 2019-01-22 DIAGNOSIS — Z794 Long term (current) use of insulin: Secondary | ICD-10-CM | POA: Diagnosis not present

## 2019-01-22 DIAGNOSIS — Y939 Activity, unspecified: Secondary | ICD-10-CM | POA: Insufficient documentation

## 2019-01-22 DIAGNOSIS — Y92009 Unspecified place in unspecified non-institutional (private) residence as the place of occurrence of the external cause: Secondary | ICD-10-CM | POA: Insufficient documentation

## 2019-01-22 DIAGNOSIS — N183 Chronic kidney disease, stage 3 unspecified: Secondary | ICD-10-CM | POA: Diagnosis not present

## 2019-01-22 DIAGNOSIS — E1122 Type 2 diabetes mellitus with diabetic chronic kidney disease: Secondary | ICD-10-CM | POA: Diagnosis not present

## 2019-01-22 DIAGNOSIS — Y999 Unspecified external cause status: Secondary | ICD-10-CM | POA: Diagnosis not present

## 2019-01-22 MED ORDER — GABAPENTIN 300 MG PO CAPS: 300.0000 mg | ORAL_CAPSULE | Freq: Three times a day (TID) | ORAL | 0 refills | Status: DC

## 2019-01-22 MED ORDER — ACETAMINOPHEN 325 MG PO TABS
650.0000 mg | ORAL_TABLET | Freq: Once | ORAL | Status: AC
  Administered 2019-01-22: 650 mg via ORAL
  Filled 2019-01-22: qty 2

## 2019-01-22 MED ORDER — HYDROCODONE-ACETAMINOPHEN 5-325 MG PO TABS: 1.0000 | ORAL_TABLET | Freq: Four times a day (QID) | ORAL | 0 refills | Status: DC | PRN
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
## 2019-01-22 NOTE — Discharge Instructions (Signed)
Take the medications as prescribed.  Follow-up with an orthopedic doctor for further evaluation of your shoulder pain.
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
## 2019-02-19 ENCOUNTER — Encounter (HOSPITAL_COMMUNITY): Payer: Self-pay | Admitting: Emergency Medicine

## 2019-02-19 ENCOUNTER — Other Ambulatory Visit: Payer: Self-pay

## 2019-02-19 ENCOUNTER — Emergency Department (HOSPITAL_COMMUNITY): Payer: Medicare Other

## 2019-02-19 ENCOUNTER — Emergency Department (HOSPITAL_COMMUNITY)
Admission: EM | Admit: 2019-02-19 | Discharge: 2019-02-19 | Disposition: A | Discharge status: Home / Self Care | Payer: Medicare Other | Attending: Emergency Medicine | Admitting: Emergency Medicine

## 2019-02-19 DIAGNOSIS — Z79899 Other long term (current) drug therapy: Secondary | ICD-10-CM | POA: Insufficient documentation

## 2019-02-19 DIAGNOSIS — R0789 Other chest pain: Secondary | ICD-10-CM | POA: Diagnosis not present

## 2019-02-19 DIAGNOSIS — J45909 Unspecified asthma, uncomplicated: Secondary | ICD-10-CM | POA: Insufficient documentation

## 2019-02-19 DIAGNOSIS — Z794 Long term (current) use of insulin: Secondary | ICD-10-CM | POA: Insufficient documentation

## 2019-02-19 DIAGNOSIS — M25512 Pain in left shoulder: Secondary | ICD-10-CM | POA: Diagnosis not present

## 2019-02-19 DIAGNOSIS — E1122 Type 2 diabetes mellitus with diabetic chronic kidney disease: Secondary | ICD-10-CM | POA: Insufficient documentation

## 2019-02-19 DIAGNOSIS — N183 Chronic kidney disease, stage 3 unspecified: Secondary | ICD-10-CM | POA: Insufficient documentation

## 2019-02-19 DIAGNOSIS — G8929 Other chronic pain: Secondary | ICD-10-CM

## 2019-02-19 DIAGNOSIS — I129 Hypertensive chronic kidney disease with stage 1 through stage 4 chronic kidney disease, or unspecified chronic kidney disease: Secondary | ICD-10-CM | POA: Diagnosis not present

## 2019-02-19 DIAGNOSIS — M79605 Pain in left leg: Secondary | ICD-10-CM | POA: Insufficient documentation

## 2019-02-19 LAB — CBC
HCT: 37.4 % (ref 36.0–46.0)
Hemoglobin: 12.6 g/dL (ref 12.0–15.0)
MCH: 30.6 pg (ref 26.0–34.0)
MCHC: 33.7 g/dL (ref 30.0–36.0)
MCV: 90.8 fL (ref 80.0–100.0)
Platelets: 250 10*3/uL (ref 150–400)
RBC: 4.12 MIL/uL (ref 3.87–5.11)
RDW: 11.7 % (ref 11.5–15.5)
WBC: 7.8 10*3/uL (ref 4.0–10.5)
nRBC: 0 % (ref 0.0–0.2)

## 2019-02-19 LAB — BASIC METABOLIC PANEL
Anion gap: 13 (ref 5–15)
BUN: 30 mg/dL — ABNORMAL HIGH (ref 8–23)
CO2: 26 mmol/L (ref 22–32)
Calcium: 9.4 mg/dL (ref 8.9–10.3)
Chloride: 102 mmol/L (ref 98–111)
Creatinine, Ser: 1.09 mg/dL — ABNORMAL HIGH (ref 0.44–1.00)
GFR calc Af Amer: 58 mL/min — ABNORMAL LOW (ref >60)
GFR calc non Af Amer: 50 mL/min — ABNORMAL LOW (ref >60)
Glucose, Bld: 173 mg/dL — ABNORMAL HIGH (ref 70–99)
Potassium: 4.5 mmol/L (ref 3.5–5.1)
Sodium: 141 mmol/L (ref 135–145)

## 2019-02-19 LAB — TROPONIN I (HIGH SENSITIVITY)
Troponin I (High Sensitivity): 10 ng/L (ref <18)
Troponin I (High Sensitivity): 14 ng/L (ref <18)

## 2019-02-19 MED ORDER — ACETAMINOPHEN 325 MG PO TABS
650.0000 mg | ORAL_TABLET | Freq: Once | ORAL | Status: AC
  Administered 2019-02-19: 650 mg via ORAL
  Filled 2019-02-19: qty 2

## 2019-02-19 MED ORDER — DICLOFENAC SODIUM 1 % EX GEL: 2.0000 g | Freq: Four times a day (QID) | CUTANEOUS | 0 refills | Status: DC

## 2019-02-19 MED ORDER — SODIUM CHLORIDE 0.9% FLUSH: 3.0000 mL | Freq: Once | INTRAVENOUS | Status: DC

## 2019-02-19 NOTE — ED Notes (Signed)
Message left for daughter to call-

## 2019-02-19 NOTE — Discharge Instructions (Addendum)
Use the Voltaren gel to help with your pain of her joints. Follow-up with your primary care provider and the orthopedic provider listed below for further evaluation. You can take Tylenol as well. Return to the ED if you start to have worsening chest pain, shortness of breath, injuries or falls, numbness in arms or legs.

## 2019-02-19 NOTE — ED Triage Notes (Signed)
EMS stated, her son stated, she has had chest pain, shoulder , and leg pain for the last 3 weeks.
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
## 2019-02-19 NOTE — ED Notes (Signed)
Urine collected and at bedside.

## 2019-02-19 NOTE — ED Notes (Signed)
Spoke to [name]. She knows Pt is getting discharged she is on the way to pick her up

## 2019-04-20 ENCOUNTER — Emergency Department (HOSPITAL_COMMUNITY): Payer: Medicare Other

## 2019-04-20 ENCOUNTER — Inpatient Hospital Stay (HOSPITAL_COMMUNITY)
Admission: EM | Admit: 2019-04-20 | Discharge: 2019-04-28 | DRG: 061 | Disposition: A | Discharge status: Home Health | Payer: Medicare Other | Attending: Internal Medicine | Admitting: Internal Medicine

## 2019-04-20 DIAGNOSIS — E1169 Type 2 diabetes mellitus with other specified complication: Secondary | ICD-10-CM

## 2019-04-20 DIAGNOSIS — R739 Hyperglycemia, unspecified: Secondary | ICD-10-CM

## 2019-04-20 DIAGNOSIS — R339 Retention of urine, unspecified: Secondary | ICD-10-CM | POA: Diagnosis present

## 2019-04-20 DIAGNOSIS — I361 Nonrheumatic tricuspid (valve) insufficiency: Secondary | ICD-10-CM | POA: Diagnosis not present

## 2019-04-20 DIAGNOSIS — M1990 Unspecified osteoarthritis, unspecified site: Secondary | ICD-10-CM | POA: Diagnosis present

## 2019-04-20 DIAGNOSIS — Z221 Carrier of other intestinal infectious diseases: Secondary | ICD-10-CM | POA: Diagnosis not present

## 2019-04-20 DIAGNOSIS — I340 Nonrheumatic mitral (valve) insufficiency: Secondary | ICD-10-CM | POA: Diagnosis not present

## 2019-04-20 DIAGNOSIS — N390 Urinary tract infection, site not specified: Secondary | ICD-10-CM | POA: Diagnosis present

## 2019-04-20 DIAGNOSIS — E861 Hypovolemia: Secondary | ICD-10-CM | POA: Diagnosis not present

## 2019-04-20 DIAGNOSIS — E7800 Pure hypercholesterolemia, unspecified: Secondary | ICD-10-CM | POA: Diagnosis not present

## 2019-04-20 DIAGNOSIS — A419 Sepsis, unspecified organism: Secondary | ICD-10-CM

## 2019-04-20 DIAGNOSIS — E119 Type 2 diabetes mellitus without complications: Secondary | ICD-10-CM

## 2019-04-20 DIAGNOSIS — A498 Other bacterial infections of unspecified site: Secondary | ICD-10-CM

## 2019-04-20 DIAGNOSIS — E1165 Type 2 diabetes mellitus with hyperglycemia: Secondary | ICD-10-CM | POA: Diagnosis present

## 2019-04-20 DIAGNOSIS — A4159 Other Gram-negative sepsis: Secondary | ICD-10-CM | POA: Diagnosis present

## 2019-04-20 DIAGNOSIS — E1142 Type 2 diabetes mellitus with diabetic polyneuropathy: Secondary | ICD-10-CM | POA: Diagnosis present

## 2019-04-20 DIAGNOSIS — R29715 NIHSS score 15: Secondary | ICD-10-CM | POA: Diagnosis present

## 2019-04-20 DIAGNOSIS — Z794 Long term (current) use of insulin: Secondary | ICD-10-CM

## 2019-04-20 DIAGNOSIS — K219 Gastro-esophageal reflux disease without esophagitis: Secondary | ICD-10-CM | POA: Diagnosis present

## 2019-04-20 DIAGNOSIS — R52 Pain, unspecified: Secondary | ICD-10-CM

## 2019-04-20 DIAGNOSIS — Z79899 Other long term (current) drug therapy: Secondary | ICD-10-CM

## 2019-04-20 DIAGNOSIS — R4182 Altered mental status, unspecified: Secondary | ICD-10-CM | POA: Diagnosis not present

## 2019-04-20 DIAGNOSIS — E785 Hyperlipidemia, unspecified: Secondary | ICD-10-CM | POA: Diagnosis present

## 2019-04-20 DIAGNOSIS — J45909 Unspecified asthma, uncomplicated: Secondary | ICD-10-CM | POA: Diagnosis present

## 2019-04-20 DIAGNOSIS — E1122 Type 2 diabetes mellitus with diabetic chronic kidney disease: Secondary | ICD-10-CM | POA: Diagnosis present

## 2019-04-20 DIAGNOSIS — N1832 Chronic kidney disease, stage 3b: Secondary | ICD-10-CM | POA: Diagnosis present

## 2019-04-20 DIAGNOSIS — Z20822 Contact with and (suspected) exposure to covid-19: Secondary | ICD-10-CM | POA: Diagnosis present

## 2019-04-20 DIAGNOSIS — G039 Meningitis, unspecified: Secondary | ICD-10-CM

## 2019-04-20 DIAGNOSIS — R29810 Facial weakness: Secondary | ICD-10-CM | POA: Diagnosis present

## 2019-04-20 DIAGNOSIS — R4701 Aphasia: Secondary | ICD-10-CM | POA: Diagnosis present

## 2019-04-20 DIAGNOSIS — I639 Cerebral infarction, unspecified: Principal | ICD-10-CM | POA: Diagnosis present

## 2019-04-20 DIAGNOSIS — R569 Unspecified convulsions: Secondary | ICD-10-CM | POA: Diagnosis not present

## 2019-04-20 DIAGNOSIS — R6520 Severe sepsis without septic shock: Secondary | ICD-10-CM | POA: Diagnosis not present

## 2019-04-20 DIAGNOSIS — I129 Hypertensive chronic kidney disease with stage 1 through stage 4 chronic kidney disease, or unspecified chronic kidney disease: Secondary | ICD-10-CM | POA: Diagnosis present

## 2019-04-20 DIAGNOSIS — E876 Hypokalemia: Secondary | ICD-10-CM | POA: Diagnosis not present

## 2019-04-20 DIAGNOSIS — I6389 Other cerebral infarction: Secondary | ICD-10-CM | POA: Diagnosis not present

## 2019-04-20 DIAGNOSIS — N179 Acute kidney failure, unspecified: Secondary | ICD-10-CM | POA: Diagnosis not present

## 2019-04-20 DIAGNOSIS — R6521 Severe sepsis with septic shock: Secondary | ICD-10-CM | POA: Diagnosis present

## 2019-04-20 DIAGNOSIS — H5589 Other irregular eye movements: Secondary | ICD-10-CM | POA: Diagnosis not present

## 2019-04-20 DIAGNOSIS — D72829 Elevated white blood cell count, unspecified: Secondary | ICD-10-CM

## 2019-04-20 DIAGNOSIS — Z833 Family history of diabetes mellitus: Secondary | ICD-10-CM

## 2019-04-20 DIAGNOSIS — N1831 Chronic kidney disease, stage 3a: Secondary | ICD-10-CM

## 2019-04-20 DIAGNOSIS — A4151 Sepsis due to Escherichia coli [E. coli]: Secondary | ICD-10-CM | POA: Diagnosis not present

## 2019-04-20 HISTORY — DX: Cerebral infarction, unspecified: I63.9

## 2019-04-20 LAB — APTT: aPTT: 24 seconds (ref 24–36)

## 2019-04-20 LAB — DIFFERENTIAL
Abs Immature Granulocytes: 0.27 10*3/uL — ABNORMAL HIGH (ref 0.00–0.07)
Basophils Absolute: 0.1 10*3/uL (ref 0.0–0.1)
Basophils Relative: 0 %
Eosinophils Absolute: 0.4 10*3/uL (ref 0.0–0.5)
Eosinophils Relative: 2 %
Immature Granulocytes: 1 %
Lymphocytes Relative: 15 %
Lymphs Abs: 3 10*3/uL (ref 0.7–4.0)
Monocytes Absolute: 0.8 10*3/uL (ref 0.1–1.0)
Monocytes Relative: 4 %
Neutro Abs: 15.7 10*3/uL — ABNORMAL HIGH (ref 1.7–7.7)
Neutrophils Relative %: 78 %

## 2019-04-20 LAB — I-STAT CHEM 8, ED
BUN: 35 mg/dL — ABNORMAL HIGH (ref 8–23)
Calcium, Ion: 1.11 mmol/L — ABNORMAL LOW (ref 1.15–1.40)
Chloride: 98 mmol/L (ref 98–111)
Creatinine, Ser: 1.3 mg/dL — ABNORMAL HIGH (ref 0.44–1.00)
Glucose, Bld: 551 mg/dL (ref 70–99)
HCT: 38 % (ref 36.0–46.0)
Hemoglobin: 12.9 g/dL (ref 12.0–15.0)
Potassium: 4 mmol/L (ref 3.5–5.1)
Sodium: 136 mmol/L (ref 135–145)
TCO2: 29 mmol/L (ref 22–32)

## 2019-04-20 LAB — CBG MONITORING, ED
Glucose-Capillary: 524 mg/dL (ref 70–99)
Glucose-Capillary: 582 mg/dL (ref 70–99)

## 2019-04-20 LAB — COMPREHENSIVE METABOLIC PANEL
ALT: 13 U/L (ref 0–44)
AST: 18 U/L (ref 15–41)
Albumin: 4 g/dL (ref 3.5–5.0)
Alkaline Phosphatase: 79 U/L (ref 38–126)
Anion gap: 15 (ref 5–15)
BUN: 33 mg/dL — ABNORMAL HIGH (ref 8–23)
CO2: 26 mmol/L (ref 22–32)
Calcium: 9.3 mg/dL (ref 8.9–10.3)
Chloride: 95 mmol/L — ABNORMAL LOW (ref 98–111)
Creatinine, Ser: 1.36 mg/dL — ABNORMAL HIGH (ref 0.44–1.00)
GFR calc Af Amer: 44 mL/min — ABNORMAL LOW (ref >60)
GFR calc non Af Amer: 38 mL/min — ABNORMAL LOW (ref >60)
Glucose, Bld: 564 mg/dL (ref 70–99)
Potassium: 4 mmol/L (ref 3.5–5.1)
Sodium: 136 mmol/L (ref 135–145)
Total Bilirubin: 0.4 mg/dL (ref 0.3–1.2)
Total Protein: 8.3 g/dL — ABNORMAL HIGH (ref 6.5–8.1)

## 2019-04-20 LAB — PROTIME-INR
INR: 0.9 (ref 0.8–1.2)
Prothrombin Time: 12.3 seconds (ref 11.4–15.2)

## 2019-04-20 LAB — CBC
HCT: 38 % (ref 36.0–46.0)
Hemoglobin: 13.1 g/dL (ref 12.0–15.0)
MCH: 30.5 pg (ref 26.0–34.0)
MCHC: 34.5 g/dL (ref 30.0–36.0)
MCV: 88.4 fL (ref 80.0–100.0)
Platelets: 324 10*3/uL (ref 150–400)
RBC: 4.3 MIL/uL (ref 3.87–5.11)
RDW: 11.3 % — ABNORMAL LOW (ref 11.5–15.5)
WBC: 20.1 10*3/uL — ABNORMAL HIGH (ref 4.0–10.5)
nRBC: 0 % (ref 0.0–0.2)

## 2019-04-20 MED ORDER — ACETAMINOPHEN 160 MG/5ML PO SOLN: 650.0000 mg | ORAL | Status: DC | PRN

## 2019-04-20 MED ORDER — CLEVIDIPINE BUTYRATE 0.5 MG/ML IV EMUL
0.0000 mg/h | INTRAVENOUS | Status: DC
  Administered 2019-04-20: 4 mg/h via INTRAVENOUS
  Administered 2019-04-21: 01:00:00 1 mg/h via INTRAVENOUS
  Administered 2019-04-21: 10 mg/h via INTRAVENOUS
  Administered 2019-04-21: 11:00:00 4 mg/h via INTRAVENOUS
  Filled 2019-04-20 (×5): qty 50

## 2019-04-20 MED ORDER — PANTOPRAZOLE SODIUM 40 MG IV SOLR
40.0000 mg | Freq: Every day | INTRAVENOUS | Status: DC
  Administered 2019-04-21 – 2019-04-24 (×4): 40 mg via INTRAVENOUS
  Filled 2019-04-20 (×5): qty 40

## 2019-04-20 MED ORDER — DEXTROSE-NACL 5-0.45 % IV SOLN: INTRAVENOUS | Status: DC

## 2019-04-20 MED ORDER — INSULIN REGULAR(HUMAN) IN NACL 100-0.9 UT/100ML-% IV SOLN
INTRAVENOUS | Status: AC
  Administered 2019-04-21: 01:00:00 8.5 [IU]/h via INTRAVENOUS
  Filled 2019-04-20: qty 100

## 2019-04-20 MED ORDER — SODIUM CHLORIDE 0.9 % IV SOLN: INTRAVENOUS | Status: DC

## 2019-04-20 MED ORDER — SENNOSIDES-DOCUSATE SODIUM 8.6-50 MG PO TABS: 1.0000 | ORAL_TABLET | Freq: Every evening | ORAL | Status: DC | PRN

## 2019-04-20 MED ORDER — ACETAMINOPHEN 650 MG RE SUPP
650.0000 mg | RECTAL | Status: DC | PRN
  Administered 2019-04-21 (×3): 650 mg via RECTAL
  Filled 2019-04-20 (×3): qty 1

## 2019-04-20 MED ORDER — ALTEPLASE (STROKE) FULL DOSE INFUSION
0.9000 mg/kg | Freq: Once | INTRAVENOUS | Status: AC
  Administered 2019-04-20: 22:00:00 50.9 mg via INTRAVENOUS
  Filled 2019-04-20: qty 100

## 2019-04-20 MED ORDER — DEXTROSE 50 % IV SOLN: 0.0000 mL | INTRAVENOUS | Status: DC | PRN

## 2019-04-20 MED ORDER — STROKE: EARLY STAGES OF RECOVERY BOOK: Freq: Once | Status: DC

## 2019-04-20 MED ORDER — IOHEXOL 350 MG/ML SOLN
60.0000 mL | Freq: Once | INTRAVENOUS | Status: AC | PRN
  Administered 2019-04-20: 60 mL via INTRAVENOUS

## 2019-04-20 MED ORDER — SODIUM CHLORIDE 0.9% FLUSH
3.0000 mL | Freq: Once | INTRAVENOUS | Status: AC
  Administered 2019-04-21: 3 mL via INTRAVENOUS

## 2019-04-20 MED ORDER — ACETAMINOPHEN 325 MG PO TABS: 650.0000 mg | ORAL_TABLET | ORAL | Status: DC | PRN

## 2019-04-20 MED ORDER — SODIUM CHLORIDE 0.9 % IV SOLN
50.0000 mL | Freq: Once | INTRAVENOUS | Status: AC
  Administered 2019-04-21: 50 mL via INTRAVENOUS

## 2019-04-20 NOTE — Progress Notes (Signed)
PHARMACIST CODE STROKE RESPONSE  Notified to mix tPA at 2212 by Dr. Cheral Marker Delivered tPA to RN at 2215  tPA dose = 5.1mg  bolus over 1 minute followed by 45.8mg  for a total dose of 50.9mg  over 1 hour  Issues/delays encountered (if applicable): BP control with SBP initially >200  Nichole Munoz 04/20/19 11:15 PM
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
## 2019-04-21 ENCOUNTER — Inpatient Hospital Stay (HOSPITAL_COMMUNITY): Payer: Medicare Other

## 2019-04-21 ENCOUNTER — Encounter (HOSPITAL_COMMUNITY): Payer: Self-pay | Admitting: Neurology

## 2019-04-21 ENCOUNTER — Other Ambulatory Visit: Payer: Self-pay

## 2019-04-21 DIAGNOSIS — I361 Nonrheumatic tricuspid (valve) insufficiency: Secondary | ICD-10-CM

## 2019-04-21 DIAGNOSIS — R4182 Altered mental status, unspecified: Secondary | ICD-10-CM

## 2019-04-21 DIAGNOSIS — H5589 Other irregular eye movements: Secondary | ICD-10-CM

## 2019-04-21 DIAGNOSIS — A419 Sepsis, unspecified organism: Secondary | ICD-10-CM | POA: Diagnosis not present

## 2019-04-21 DIAGNOSIS — G039 Meningitis, unspecified: Secondary | ICD-10-CM

## 2019-04-21 DIAGNOSIS — I340 Nonrheumatic mitral (valve) insufficiency: Secondary | ICD-10-CM | POA: Diagnosis not present

## 2019-04-21 DIAGNOSIS — R4701 Aphasia: Secondary | ICD-10-CM

## 2019-04-21 LAB — BASIC METABOLIC PANEL
Anion gap: 21 — ABNORMAL HIGH (ref 5–15)
Anion gap: 22 — ABNORMAL HIGH (ref 5–15)
Anion gap: 16 — ABNORMAL HIGH (ref 5–15)
BUN: 36 mg/dL — ABNORMAL HIGH (ref 8–23)
BUN: 39 mg/dL — ABNORMAL HIGH (ref 8–23)
BUN: 37 mg/dL — ABNORMAL HIGH (ref 8–23)
CO2: 21 mmol/L — ABNORMAL LOW (ref 22–32)
CO2: 21 mmol/L — ABNORMAL LOW (ref 22–32)
CO2: 23 mmol/L (ref 22–32)
Calcium: 8.5 mg/dL — ABNORMAL LOW (ref 8.9–10.3)
Calcium: 9 mg/dL (ref 8.9–10.3)
Calcium: 8.6 mg/dL — ABNORMAL LOW (ref 8.9–10.3)
Chloride: 98 mmol/L (ref 98–111)
Chloride: 99 mmol/L (ref 98–111)
Chloride: 105 mmol/L (ref 98–111)
Creatinine, Ser: 1.54 mg/dL — ABNORMAL HIGH (ref 0.44–1.00)
Creatinine, Ser: 1.67 mg/dL — ABNORMAL HIGH (ref 0.44–1.00)
Creatinine, Ser: 1.46 mg/dL — ABNORMAL HIGH (ref 0.44–1.00)
GFR calc Af Amer: 38 mL/min — ABNORMAL LOW (ref >60)
GFR calc Af Amer: 35 mL/min — ABNORMAL LOW (ref >60)
GFR calc Af Amer: 41 mL/min — ABNORMAL LOW (ref >60)
GFR calc non Af Amer: 33 mL/min — ABNORMAL LOW (ref >60)
GFR calc non Af Amer: 30 mL/min — ABNORMAL LOW (ref >60)
GFR calc non Af Amer: 35 mL/min — ABNORMAL LOW (ref >60)
Glucose, Bld: 279 mg/dL — ABNORMAL HIGH (ref 70–99)
Glucose, Bld: 338 mg/dL — ABNORMAL HIGH (ref 70–99)
Glucose, Bld: 80 mg/dL (ref 70–99)
Potassium: 3.8 mmol/L (ref 3.5–5.1)
Potassium: 4 mmol/L (ref 3.5–5.1)
Potassium: 3.6 mmol/L (ref 3.5–5.1)
Sodium: 140 mmol/L (ref 135–145)
Sodium: 142 mmol/L (ref 135–145)
Sodium: 144 mmol/L (ref 135–145)

## 2019-04-21 LAB — URINALYSIS, ROUTINE W REFLEX MICROSCOPIC
Bilirubin Urine: NEGATIVE
Bilirubin Urine: NEGATIVE
Glucose, UA: >=500 mg/dL — AB
Glucose, UA: 150 mg/dL — AB
Hgb urine dipstick: NEGATIVE
Ketones, ur: NEGATIVE mg/dL
Ketones, ur: NEGATIVE mg/dL
Leukocytes,Ua: NEGATIVE
Nitrite: NEGATIVE
Nitrite: POSITIVE — AB
Protein, ur: 100 mg/dL — AB
Protein, ur: >=300 mg/dL — AB
Specific Gravity, Urine: 1.022 (ref 1.005–1.030)
Specific Gravity, Urine: 1.023 (ref 1.005–1.030)
WBC, UA: >50 WBC/hpf — ABNORMAL HIGH (ref 0–5)
pH: 7 (ref 5.0–8.0)
pH: 5 (ref 5.0–8.0)

## 2019-04-21 LAB — GLUCOSE, CAPILLARY
Glucose-Capillary: 383 mg/dL — ABNORMAL HIGH (ref 70–99)
Glucose-Capillary: 256 mg/dL — ABNORMAL HIGH (ref 70–99)
Glucose-Capillary: 178 mg/dL — ABNORMAL HIGH (ref 70–99)
Glucose-Capillary: 284 mg/dL — ABNORMAL HIGH (ref 70–99)
Glucose-Capillary: 332 mg/dL — ABNORMAL HIGH (ref 70–99)
Glucose-Capillary: 120 mg/dL — ABNORMAL HIGH (ref 70–99)
Glucose-Capillary: 68 mg/dL — ABNORMAL LOW (ref 70–99)
Glucose-Capillary: 68 mg/dL — ABNORMAL LOW (ref 70–99)

## 2019-04-21 LAB — LIPID PANEL
Cholesterol: 273 mg/dL — ABNORMAL HIGH (ref 0–200)
HDL: 43 mg/dL (ref >40)
LDL Cholesterol: 158 mg/dL — ABNORMAL HIGH (ref 0–99)
Total CHOL/HDL Ratio: 6.3 RATIO
Triglycerides: 359 mg/dL — ABNORMAL HIGH (ref <150)
VLDL: 72 mg/dL — ABNORMAL HIGH (ref 0–40)

## 2019-04-21 LAB — CBC
HCT: 40.3 % (ref 36.0–46.0)
Hemoglobin: 13.9 g/dL (ref 12.0–15.0)
MCH: 30.5 pg (ref 26.0–34.0)
MCHC: 34.5 g/dL (ref 30.0–36.0)
MCV: 88.4 fL (ref 80.0–100.0)
Platelets: 298 10*3/uL (ref 150–400)
RBC: 4.56 MIL/uL (ref 3.87–5.11)
RDW: 11.5 % (ref 11.5–15.5)
WBC: 20.6 10*3/uL — ABNORMAL HIGH (ref 4.0–10.5)
nRBC: 0 % (ref 0.0–0.2)

## 2019-04-21 LAB — RESPIRATORY PANEL BY RT PCR (FLU A&B, COVID)
Influenza A by PCR: NEGATIVE
Influenza B by PCR: NEGATIVE
SARS Coronavirus 2 by RT PCR: NEGATIVE

## 2019-04-21 LAB — LACTIC ACID, PLASMA
Lactic Acid, Venous: 5.1 mmol/L (ref 0.5–1.9)
Lactic Acid, Venous: 5 mmol/L (ref 0.5–1.9)
Lactic Acid, Venous: 3.6 mmol/L (ref 0.5–1.9)
Lactic Acid, Venous: 2.2 mmol/L (ref 0.5–1.9)

## 2019-04-21 LAB — CBG MONITORING, ED: Glucose-Capillary: 426 mg/dL — ABNORMAL HIGH (ref 70–99)

## 2019-04-21 LAB — APTT: aPTT: 26 seconds (ref 24–36)

## 2019-04-21 LAB — HEMOGLOBIN A1C
Hgb A1c MFr Bld: 9.7 % — ABNORMAL HIGH (ref 4.8–5.6)
Mean Plasma Glucose: 231.69 mg/dL

## 2019-04-21 LAB — ECHOCARDIOGRAM COMPLETE
Height: 63 in
Weight: 1992.96 oz

## 2019-04-21 LAB — PROTIME-INR
INR: 1 (ref 0.8–1.2)
Prothrombin Time: 13.3 seconds (ref 11.4–15.2)

## 2019-04-21 LAB — PROCALCITONIN: Procalcitonin: 32.34 ng/mL

## 2019-04-21 MED ORDER — LACTATED RINGERS IV BOLUS (SEPSIS)
250.0000 mL | Freq: Once | INTRAVENOUS | Status: AC
  Administered 2019-04-21: 250 mL via INTRAVENOUS

## 2019-04-21 MED ORDER — INSULIN GLARGINE 100 UNIT/ML ~~LOC~~ SOLN
15.0000 [IU] | Freq: Two times a day (BID) | SUBCUTANEOUS | Status: DC
  Administered 2019-04-21: 11:00:00 15 [IU] via SUBCUTANEOUS
  Filled 2019-04-21 (×2): qty 0.15

## 2019-04-21 MED ORDER — LACTATED RINGERS IV BOLUS (SEPSIS)
1000.0000 mL | Freq: Once | INTRAVENOUS | Status: AC
  Administered 2019-04-21: 13:00:00 1000 mL via INTRAVENOUS

## 2019-04-21 MED ORDER — INSULIN GLARGINE 100 UNIT/ML ~~LOC~~ SOLN
15.0000 [IU] | Freq: Once | SUBCUTANEOUS | Status: DC
  Filled 2019-04-21: qty 0.15

## 2019-04-21 MED ORDER — VANCOMYCIN HCL 1250 MG/250ML IV SOLN
1250.0000 mg | INTRAVENOUS | Status: DC
  Administered 2019-04-21: 1250 mg via INTRAVENOUS
  Filled 2019-04-21: qty 250

## 2019-04-21 MED ORDER — INSULIN ASPART 100 UNIT/ML ~~LOC~~ SOLN
0.0000 [IU] | Freq: Three times a day (TID) | SUBCUTANEOUS | Status: DC
  Administered 2019-04-21: 11 [IU] via SUBCUTANEOUS
  Administered 2019-04-21: 2 [IU] via SUBCUTANEOUS
  Administered 2019-04-21: 8 [IU] via SUBCUTANEOUS
  Administered 2019-04-22: 5 [IU] via SUBCUTANEOUS
  Administered 2019-04-23: 2 [IU] via SUBCUTANEOUS
  Administered 2019-04-23: 3 [IU] via SUBCUTANEOUS
  Administered 2019-04-23 – 2019-04-24 (×2): 5 [IU] via SUBCUTANEOUS
  Administered 2019-04-24: 3 [IU] via SUBCUTANEOUS
  Administered 2019-04-25 – 2019-04-26 (×4): 2 [IU] via SUBCUTANEOUS
  Administered 2019-04-26 – 2019-04-28 (×4): 3 [IU] via SUBCUTANEOUS
  Administered 2019-04-28: 2 [IU] via SUBCUTANEOUS

## 2019-04-21 MED ORDER — ACETAMINOPHEN 650 MG RE SUPP
650.0000 mg | Freq: Four times a day (QID) | RECTAL | Status: DC
  Administered 2019-04-21: 650 mg via RECTAL
  Filled 2019-04-21: qty 1

## 2019-04-21 MED ORDER — LACTATED RINGERS IV BOLUS
500.0000 mL | Freq: Once | INTRAVENOUS | Status: AC
  Administered 2019-04-21: 18:00:00 500 mL via INTRAVENOUS

## 2019-04-21 MED ORDER — PIPERACILLIN-TAZOBACTAM 3.375 G IVPB
3.3750 g | Freq: Three times a day (TID) | INTRAVENOUS | Status: DC
  Administered 2019-04-21 – 2019-04-23 (×7): 3.375 g via INTRAVENOUS
  Filled 2019-04-21 (×7): qty 50

## 2019-04-21 MED ORDER — PIPERACILLIN-TAZOBACTAM 3.375 G IVPB 30 MIN
3.3750 g | INTRAVENOUS | Status: AC
  Administered 2019-04-21: 09:00:00 3.375 g via INTRAVENOUS
  Filled 2019-04-21 (×2): qty 50

## 2019-04-21 MED ORDER — ACETAMINOPHEN 325 MG PO TABS: 650.0000 mg | ORAL_TABLET | Freq: Four times a day (QID) | ORAL | Status: DC

## 2019-04-21 MED ORDER — CHLORHEXIDINE GLUCONATE CLOTH 2 % EX PADS
6.0000 | MEDICATED_PAD | Freq: Every day | CUTANEOUS | Status: DC
  Administered 2019-04-21 – 2019-04-27 (×7): 6 via TOPICAL

## 2019-04-21 MED ORDER — DEXMEDETOMIDINE HCL IN NACL 400 MCG/100ML IV SOLN
0.4000 ug/kg/h | INTRAVENOUS | Status: DC
  Administered 2019-04-21: 0.4 ug/kg/h via INTRAVENOUS
  Filled 2019-04-21 (×2): qty 100

## 2019-04-21 MED ORDER — SODIUM CHLORIDE 0.9 % IV SOLN
INTRAVENOUS | Status: DC | PRN
  Administered 2019-04-21: 250 mL via INTRAVENOUS

## 2019-04-21 MED ORDER — VANCOMYCIN HCL IN DEXTROSE 1-5 GM/200ML-% IV SOLN
1000.0000 mg | INTRAVENOUS | Status: DC
  Administered 2019-04-23: 1000 mg via INTRAVENOUS
  Filled 2019-04-21: qty 200

## 2019-04-21 MED ORDER — INSULIN GLARGINE 100 UNIT/ML ~~LOC~~ SOLN
20.0000 [IU] | Freq: Two times a day (BID) | SUBCUTANEOUS | Status: DC
  Administered 2019-04-22 – 2019-04-25 (×7): 20 [IU] via SUBCUTANEOUS
  Administered 2019-04-25: 10 [IU] via SUBCUTANEOUS
  Administered 2019-04-26 – 2019-04-27 (×3): 20 [IU] via SUBCUTANEOUS
  Filled 2019-04-21 (×14): qty 0.2

## 2019-04-21 MED ORDER — ACETAMINOPHEN 160 MG/5ML PO SOLN: 650.0000 mg | Freq: Four times a day (QID) | ORAL | Status: DC

## 2019-04-21 MED ORDER — LACTATED RINGERS IV BOLUS (SEPSIS)
500.0000 mL | Freq: Once | INTRAVENOUS | Status: AC
  Administered 2019-04-21: 500 mL via INTRAVENOUS

## 2019-04-21 MED ORDER — INSULIN ASPART 100 UNIT/ML ~~LOC~~ SOLN
0.0000 [IU] | Freq: Every day | SUBCUTANEOUS | Status: DC
  Administered 2019-04-26: 2 [IU] via SUBCUTANEOUS

## 2019-04-21 MED ORDER — INSULIN GLARGINE 100 UNIT/ML ~~LOC~~ SOLN
30.0000 [IU] | Freq: Two times a day (BID) | SUBCUTANEOUS | Status: DC
  Filled 2019-04-21: qty 0.3

## 2019-04-21 NOTE — Progress Notes (Signed)
Pharmacy Antibiotic Note  Nichole Munoz is a 74 y.o. female admitted on 04/20/2019 with sepsis.  Pharmacy has been consulted for Vancomycin and Zosyn dosing. Patient febrile this morning with a Tmax of 102.9 and elevated WBC 20.1. Renal function worsening - Scr 1.36>1.54>1.67.   Plan: Vancomycin 1000 mg IV Q 48 hrs. (estAUC 497, Scr used 1.67, Vd 0.7) starting 4/17 Zosyn 3.375gm IV q8h Monitor renal function, cultures and clinical improvement Vanc levels as indicated   Height: 5\' 3"  (160 cm) Weight: 56.5 kg (124 lb 9 oz) IBW/kg (Calculated) : 52.4  Temp (24hrs), Avg:101.7 F (38.7 C), Min:98.8 F (37.1 C), Max:102.9 F (39.4 C)  Recent Labs  Lab 04/20/19 2148 04/20/19 2152 04/21/19 0838 04/21/19 1115  WBC  --  20.1*  --  20.6*  CREATININE 1.30* 1.36* 1.54* 1.67*    Estimated Creatinine Clearance: 24.4 mL/min (A) (by C-G formula based on SCr of 1.67 mg/dL (H)).    Allergies  Allergen Reactions  . Other Other (See Comments)    Chicken causes itching/  clorox, Bleach causes shortness of breath  . No Known Allergies     Previous listing of unable to tolerate SMELL of Clorox and Chicken REACTION NOT AN ALLERGY     Antimicrobials this admission: 4/15 Vanc >>  4/15 Zosyn >>   Microbiology results:  Of note, Cx collected after abx given 4/15 BCx: pending  4/15 UCx: pending  Thank you for allowing pharmacy to be a part of this patient's care.  Lorel Monaco, PharmD PGY1 Ambulatory Care Resident [employer] # [phone]

## 2019-04-21 NOTE — Progress Notes (Signed)
Inpatient Diabetes Program Recommendations  AACE/ADA: New Consensus Statement on Inpatient Glycemic Control (2015)  Target Ranges:  Prepandial:   less than 140 mg/dL      Peak postprandial:   less than 180 mg/dL (1-2 hours)      Critically ill patients:  140 - 180 mg/dL   Lab Results  Component Value Date   GLUCAP 332 (H) 04/21/2019   HGBA1C 10.5 (H) 01/24/2017    Review of Glycemic Control  Diabetes history: DM2 Outpatient Diabetes medications: Lantus 30 units bid (per note pt is not taking) + Glucotrol 5 mg bid + Tradjenta 5 mg + Metformin 850 mg bid + Novolog correction scale Current orders for Inpatient glycemic control: Lantus 15 units bid + Novolog moderate correction tid + hs 0-5 units  Inpatient Diabetes Program Recommendations:   Noted pending A1c and will plan to speak with patient. -Increase Lantus to 20 units bid -Novolog 5 units tid meal coverage when patient is eating 50% meals  Thank you, Bethena Roys E. Melinna Linarez, RN, MSN, CDE  Diabetes Coordinator Inpatient Glycemic Control Team Team Pager [phone] (8am-5pm) 04/21/2019 11:33 AM

## 2019-04-21 NOTE — Progress Notes (Addendum)
STROKE TEAM PROGRESS NOTE   INTERVAL HISTORY Her RN is at the bedside. The pt was found down by family and has not been alert since admit. She was treated with IV tPA. The pts son was here all night, but has now left and there is no [place] available from our on-line services. Per RN, the pt was not following commands or speaking with son (her her language) before he left. She is critically ill, lying in fetal position. Code Sepsis called.   Vitals:   04/21/19 0630 04/21/19 0645 04/21/19 0700 04/21/19 0715  BP: 117/72 (!) 141/81 (!) 146/79 (!) 154/71  Pulse: (!) 116 (!) 116 (!) 116 (!) 118  Resp: (!) 21 (!) 31 (!) 23 (!) 24  Temp:    (!) 102.9 F (39.4 C)  TempSrc:    Rectal  SpO2: 93% 93% 91% 92%  Weight:      Height:        CBC:  Recent Labs  Lab 04/20/19 2148 04/20/19 2152  WBC  --  20.1*  NEUTROABS  --  15.7*  HGB 12.9 13.1  HCT 38.0 38.0  MCV  --  88.4  PLT  --  983    Basic Metabolic Panel:  Recent Labs  Lab 04/20/19 2148 04/20/19 2152  NA 136 136  K 4.0 4.0  CL 98 95*  CO2  --  26  GLUCOSE 551* 564*  BUN 35* 33*  CREATININE 1.30* 1.36*  CALCIUM  --  9.3   Lipid Panel:     Component Value Date/Time   CHOL 192 06/12/2015 0414   TRIG 160 (H) 06/12/2015 0414   HDL 37 (L) 06/12/2015 0414   CHOLHDL 5.2 06/12/2015 0414   VLDL 32 06/12/2015 0414   LDLCALC 123 (H) 06/12/2015 0414   HgbA1c:  Lab Results  Component Value Date   HGBA1C 10.5 (H) 01/24/2017   Urine Drug Screen: No results found for: LABOPIA, COCAINSCRNUR, LABBENZ, AMPHETMU, THCU, LABBARB  Alcohol Level     Component Value Date/Time   ETH <5 04/25/2016 0957    IMAGING past 24 hours CT ANGIO HEAD W OR WO CONTRAST  Result Date: 04/20/2019 CLINICAL DATA:  Aphasia and leftward gaze EXAM: CT ANGIOGRAPHY HEAD AND NECK TECHNIQUE: Multidetector CT imaging of the head and neck was performed using the standard protocol during bolus administration of intravenous contrast. Multiplanar CT  image reconstructions and MIPs were obtained to evaluate the vascular anatomy. Carotid stenosis measurements (when applicable) are obtained utilizing NASCET criteria, using the distal internal carotid diameter as the denominator. CONTRAST:  38mL OMNIPAQUE IOHEXOL 350 MG/ML SOLN COMPARISON:  Head CT same day FINDINGS: CTA NECK FINDINGS SKELETON: There is no bony spinal canal stenosis. No lytic or blastic lesion. OTHER NECK: Normal pharynx, larynx and major salivary glands. No cervical lymphadenopathy. Unremarkable thyroid gland. UPPER CHEST: No pneumothorax or pleural effusion. No nodules or masses. AORTIC ARCH: There is moderate calcific atherosclerosis of the aortic arch. There is no aneurysm, dissection or hemodynamically significant stenosis of the visualized portion of the aorta. Conventional 3 vessel aortic branching pattern. The visualized proximal subclavian arteries are widely patent. RIGHT CAROTID SYSTEM: No dissection, occlusion or aneurysm. Mild atherosclerotic calcification at the carotid bifurcation without hemodynamically significant stenosis. LEFT CAROTID SYSTEM: No dissection, occlusion or aneurysm. Mild atherosclerotic calcification at the carotid bifurcation without hemodynamically significant stenosis. VERTEBRAL ARTERIES: Left dominant configuration. Both origins are clearly patent. There is no dissection, occlusion or flow-limiting stenosis to the skull base (V1-V3 segments). CTA  HEAD FINDINGS POSTERIOR CIRCULATION: --Vertebral arteries: Mild calcification of the V4 segments --Posterior inferior cerebellar arteries (PICA): Patent origins from the vertebral arteries. --Anterior inferior cerebellar arteries (AICA): Patent origins from the basilar artery. --Basilar artery: Diminutive basilar artery. --Superior cerebellar arteries: Normal. --Posterior cerebral arteries: Normal. Both are predominantly supplied by the posterior communicating arteries (p-comm). ANTERIOR CIRCULATION: --Intracranial  internal carotid arteries: Normal. --Anterior cerebral arteries (ACA): Normal. Absent left A1 segment, normal variant --Middle cerebral arteries (MCA): Normal. VENOUS SINUSES: As permitted by contrast timing, patent. ANATOMIC VARIANTS: Fetal origins of both posterior cerebral arteries. Review of the MIP images confirms the above findings. IMPRESSION: No emergent large vessel occlusion or high-grade stenosis of the intracranial arteries. Aortic atherosclerosis (ICD10-I70.0). Electronically Signed   By: Ulyses Jarred M.D.   On: 04/20/2019 23:41   CT ANGIO NECK W OR WO CONTRAST  Result Date: 04/20/2019 CLINICAL DATA:  Aphasia and leftward gaze EXAM: CT ANGIOGRAPHY HEAD AND NECK TECHNIQUE: Multidetector CT imaging of the head and neck was performed using the standard protocol during bolus administration of intravenous contrast. Multiplanar CT image reconstructions and MIPs were obtained to evaluate the vascular anatomy. Carotid stenosis measurements (when applicable) are obtained utilizing NASCET criteria, using the distal internal carotid diameter as the denominator. CONTRAST:  39mL OMNIPAQUE IOHEXOL 350 MG/ML SOLN COMPARISON:  Head CT same day FINDINGS: CTA NECK FINDINGS SKELETON: There is no bony spinal canal stenosis. No lytic or blastic lesion. OTHER NECK: Normal pharynx, larynx and major salivary glands. No cervical lymphadenopathy. Unremarkable thyroid gland. UPPER CHEST: No pneumothorax or pleural effusion. No nodules or masses. AORTIC ARCH: There is moderate calcific atherosclerosis of the aortic arch. There is no aneurysm, dissection or hemodynamically significant stenosis of the visualized portion of the aorta. Conventional 3 vessel aortic branching pattern. The visualized proximal subclavian arteries are widely patent. RIGHT CAROTID SYSTEM: No dissection, occlusion or aneurysm. Mild atherosclerotic calcification at the carotid bifurcation without hemodynamically significant stenosis. LEFT CAROTID SYSTEM:  No dissection, occlusion or aneurysm. Mild atherosclerotic calcification at the carotid bifurcation without hemodynamically significant stenosis. VERTEBRAL ARTERIES: Left dominant configuration. Both origins are clearly patent. There is no dissection, occlusion or flow-limiting stenosis to the skull base (V1-V3 segments). CTA HEAD FINDINGS POSTERIOR CIRCULATION: --Vertebral arteries: Mild calcification of the V4 segments --Posterior inferior cerebellar arteries (PICA): Patent origins from the vertebral arteries. --Anterior inferior cerebellar arteries (AICA): Patent origins from the basilar artery. --Basilar artery: Diminutive basilar artery. --Superior cerebellar arteries: Normal. --Posterior cerebral arteries: Normal. Both are predominantly supplied by the posterior communicating arteries (p-comm). ANTERIOR CIRCULATION: --Intracranial internal carotid arteries: Normal. --Anterior cerebral arteries (ACA): Normal. Absent left A1 segment, normal variant --Middle cerebral arteries (MCA): Normal. VENOUS SINUSES: As permitted by contrast timing, patent. ANATOMIC VARIANTS: Fetal origins of both posterior cerebral arteries. Review of the MIP images confirms the above findings. IMPRESSION: No emergent large vessel occlusion or high-grade stenosis of the intracranial arteries. Aortic atherosclerosis (ICD10-I70.0). Electronically Signed   By: Ulyses Jarred M.D.   On: 04/20/2019 23:41   DG CHEST PORT 1 VIEW  Result Date: 04/21/2019 CLINICAL DATA:  Stroke symptoms, lethargy, emesis EXAM: PORTABLE CHEST 1 VIEW COMPARISON:  02/19/2019 FINDINGS: Single frontal view of the chest demonstrates a stable cardiac silhouette. Continued retrocardiac consolidation unchanged. No new airspace disease, effusion, or pneumothorax. No acute bony abnormality. IMPRESSION: 1. Persistent left lower lobe consolidation likely scarring. 2. No acute intrathoracic process. Electronically Signed   By: Randa Ngo M.D.   On: 04/21/2019 02:37   CT  HEAD  CODE STROKE WO CONTRAST  Result Date: 04/20/2019 CLINICAL DATA:  Code stroke.  Aphasia and left gaze EXAM: CT HEAD WITHOUT CONTRAST TECHNIQUE: Contiguous axial images were obtained from the base of the skull through the vertex without intravenous contrast. COMPARISON:  01/24/2017 FINDINGS: Brain: Imaging of the pons and posterior fossa is degraded by motion. there is no mass, hemorrhage or extra-axial collection. The size and configuration of the ventricles and extra-axial CSF spaces are normal. There is hypoattenuation of the periventricular white matter, most commonly indicating chronic ischemic microangiopathy. Vascular: No abnormal hyperdensity of the major intracranial arteries or dural venous sinuses. No intracranial atherosclerosis. Skull: The visualized skull base, calvarium and extracranial soft tissues are normal. Sinuses/Orbits: No fluid levels or advanced mucosal thickening of the visualized paranasal sinuses. No mastoid or middle ear effusion. The orbits are normal. ASPECTS [facility] Stroke Program Early CT Score) - Ganglionic level infarction (caudate, lentiform nuclei, internal capsule, insula, M1-M3 cortex): 7 - Supraganglionic infarction (M4-M6 cortex): 3 Total score (0-10 with 10 being normal): 10 IMPRESSION: 1. Motion degraded study without acute abnormality. 2. ASPECTS is 10. These results were communicated to Dr. Kerney Elbe at 10:11 pm on 04/20/2019 by text page via the [facility] messaging system. Electronically Signed   By: Ulyses Jarred M.D.   On: 04/20/2019 22:11  EEG: This technically difficult study is suggestive of cortical dysfunction in left hemisphere, nonspecific etiology but could be secondary to postictal state. No seizures or definite epileptiform discharges were seen throughout the recording.  PHYSICAL EXAM GENERAL: critically ill, malnourished, frail and elderly appearing HEENT-  [place]/AT. Neck supple  Cardiovascular - Sinus tachycardia Lungs - Respirations unlabored.  CTA. Abdomen - Non-distended Extremities - No edema  Neurologic Examination: Obtunded. Nonverbal. Not following any commands. Eyes close tightly when attempting to examine. Remained in fetal position without attempt to move extremities during exam.  Unable to test blink to threat or EOMs since eyes do not stay open. Eyes with upward gaze deviation. Grimace to sternal rub. Unable to detect clear facial weakness. Unable to assess hearing. Head preferentially rotated to the left. Unable to test tongue protrusion. Tone is increased throughout. Cerebellar/Gait: Unable to assess  ASSESSMENT/PLAN [name] is a 74 y.o. female presenting with acute onset of decreased responsiveness, left gaze deviation and aphasia. She was treated with IV tPA.  Unclear diagnosis of stroke or not. MRI pending for better evaluation  Code Stroke: CT head No acute abnormality. ASPECTS 10.   CTA head & neck: no LVO  MRI  pending  2D Echo pending  LDL No results found for requested labs within last 26280 hours.  HgbA1c 10.5  Scd's for VTE prophylaxis for now (s/p tPA)    Diet   Diet Carb Modified Fluid consistency: Thin; Room service appropriate? Yes     None prior to admission, now on s/p tPA  Therapy recommendations:  pending  Disposition:  pending  Hypertension  Home meds:  zestiril  Stable . Long-term BP goal normotensive  Hyperlipidemia  Home meds:none  LDL No results found for requested labs within last 26280 hours., goal < 70  Add lipitor pending result  Continue statin at discharge  Diabetes type II Uncontrolled  Home meds:  Glucotrol, novolog, lantus, glucophage, tradjenta  HgbA1c 10.5, goal < 7.0  CBGs Recent Labs    04/21/19 0331 04/21/19 0437 04/21/19 0545  GLUCAP 383* 256* 178*      SSI  Other Stroke Risk Factors  Advanced age  Other Active Problems  Septic Shock- Given her critical illness, it is difficult to discern if stroke is contributing or not  to her obtunded state. Currently it is unclear if pt has had a stroke. EEG did not show seizures. IV abx started  Language barrier- pt is non-English speaking. We are not able to get a [place] translator at this time. Family has left bedside at time of our exam.  Hospital day # 1  Desiree Metzger-Cihelka, ARNP-C, ANVP-BC Pager: [phone]  I have personally obtained history,examined this patient, reviewed notes, independently viewed imaging studies, participated in medical decision making and plan of care.ROS completed by me personally and pertinent positives fully documented  I have made any additions or clarifications directly to the above note. Agree with note above.  She presented with aphasia and unresponsiveness in the setting of high fever and hypotension and sepsis.  Recommend antibiotics and IV fluids for stabilization and brain imaging when stable enough for transport.  If no obvious source of fever is found may need spinal tap to look for meningitis.  Check EEG to look for seizure activity.  No family available at the bedside.  [place] language interpreter also not available on the telemetry machine.  Discussed with Dr. Vance Gather medical hospitalist.This patient is critically ill and at significant risk of neurological worsening, death and care requires constant monitoring of vital signs, hemodynamics,respiratory and cardiac monitoring, extensive review of multiple databases, frequent neurological assessment, discussion with family, other specialists and medical decision making of high complexity.I have made any additions or clarifications directly to the above note.This critical care time does not reflect procedure time, or teaching time or supervisory time of PA/NP/Med Resident etc but could involve care discussion time.  I spent 30 minutes of neurocritical care time  in the care of  this patient.      Antony Contras, MD Medical Director [facility] Stroke Center Pager:  [phone] 04/21/2019 5:43 PM  To contact Stroke Continuity provider, please refer to [email or web address] After hours, contact General Neurology

## 2019-04-21 NOTE — Progress Notes (Signed)
CRITICAL VALUE ALERT  Critical Value:  LActic 5.1  Date & Time Notied:  04/21/2019 1:37 PM   Provider Notified: Yes  Orders Received/Actions taken: Yes

## 2019-04-21 NOTE — Procedures (Signed)
Patient Name: Nichole Munoz  MRN: [id number]  Epilepsy Attending: Lora Havens  Referring Physician/Provider: Dr. Antony Contras Date: 04/21/2019 Duration: 23.35 minutes  Patient history: 74 year old female presented with altered mental status, left gaze deviation and aphasia.  EEG evaluate for seizures.  Level of alertness: lethargic  AEDs during EEG study: None  Technical aspects: This EEG study was done with scalp electrodes positioned according to the 10-20 International system of electrode placement. Electrical activity was acquired at a sampling rate of 500Hz  and reviewed with a high frequency filter of 70Hz  and a low frequency filter of 1Hz . EEG data were recorded continuously and digitally stored.   DESCRIPTION: No clear posterior dominant rhythm was seen.  EEG showed continuous generalized and lateralized left hemisphere polymorphic 3 to 6 Hz theta-delta slowing.  Sharp transients were also seen in left parieto-occipital region.  Hyperventilation and photic stimulation were not performed.  Of note, EEG was technically difficult due to significant myogenic artifact  Abnormality -Continuous slow, generalized and lateralized left hemisphere  IMPRESSION: This technically difficult study is suggestive of cortical dysfunction in left hemisphere, nonspecific etiology but could be secondary to postictal state. No seizures or definite epileptiform discharges were seen throughout the recording.  Consider repeat EEG or long-term EEG if concern for interictal/ictal activity persists.  Filiberto Wamble Barbra Sarks

## 2019-04-21 NOTE — Progress Notes (Signed)
S: Patient is stable. Still non-communicative.   O: BP (!) 147/80   Pulse (!) 103   Temp (!) 101.1 F (38.4 C) (Oral)   Resp 20   Ht 5\' 3"  (1.6 m)   Wt 56.5 kg   SpO2 95%   BMI 22.06 kg/m   HEENT: Skin is somewhat warm to touch. Non-diaphoretic. Neck is supple.  Lungs: Respirations unlabored Ext: No edema  Neuro: Ment: Initially asleep, will arouse to a level of decreased alertness after 20 seconds sternal rub. Briefly gazed to the right when looking at examiner, then fell asleep again. No attempts to communicate.  CN: Leftward gaze preference. Right facial droop.  Motor: Moves upper extremities in response to sternal rub briefly. Unable to assess for asymmetry. No posturing, tremor, jerking or other seizure like activity noted.  BLE flexed at hips and knees. Patient becomes irritated when examiner attempts to straighten lower extremities.    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 May have meningitis. On empiric vancomycin and zosyn. -- MRI brain scheduled for 9 PM. May need low-dose Precedex gtt in order for her to tolerate MRI.  -- If MRI brain is negative for a large stroke with mass effect, then will be able to obtain LP at 11:15 PM, which is 24 hours since tPA infusion was completed.   20 minutes of ICU time.   Electronically signed: Dr. Kerney Elbe

## 2019-04-21 NOTE — Progress Notes (Signed)
PT Cancellation Note  Patient Details Name: Nichole Munoz MRN: [id number] DOB: [birth year]   Cancelled Treatment:    Reason Eval/Treat Not Completed: Patient not medically ready;Other (comment)(pt also not able to interact with her own family) much less an interpreter.  RN suggests holding today.  04/21/2019  Ginger Carne., PT Acute Rehabilitation Services [phone]  (pager) [phone]  (office)   Tessie Fass Linh Johannes 04/21/2019, 1:49 PM

## 2019-04-21 NOTE — Progress Notes (Addendum)
MRI completed. A small, approximately 1 cm focus if restricted diffusion is seen in the left medial temporal lobe posteriorly. This may represent a subacute infarction versus cytotoxic edema from possible prolonged seizure activity at home prior to being found unresponsive. If it is an infarction, then its size and location do not explain her presentation. If it represents cytotoxic edema from seizure activity, then Todd's paralysis and dense postictal state would be likely explanations for her presentation.   Called the patient's daughter Enid Maultsby Clippinger to obtain consent for LP. Risks and benefits discussed at length, with conversation on speakerphone witnessed by Antonietta Breach, RN. Informed consent obtained. Consent form has been filled out and signed. Will proceed with LP.  Additional 20 minutes of critical care time spent obtaining consent.   Addendum: LP procedure: The patient was prepped in sterile fashion with betadine. She was placed in the lateral decubitus position and lidocaine local anesthetic injected at the midline at the L4-5 level. 5 attempts made with Quincke spinal needle at obtaining access  to the subdural/subarachnoid space at L4-5 without success. Needle withdrawn and pressure applied with gauze for 1 minute. Patient placed back in supine position. Will order fluoro-guided LP for the AM.   Patient was neurologically stable throughout the procedure. Exam unchanged.   15 additional minutes of critical care time.   Electronically signed: Dr. Kerney Elbe

## 2019-04-21 NOTE — Progress Notes (Signed)
OT Cancellation Note  Patient Details Name: Nichole Munoz MRN: [id number] DOB: [birth year]   Cancelled Treatment:    Reason Eval/Treat Not Completed: Active bedrest order.  Will check back.  Nilsa Nutting., OTR/L Acute Rehabilitation Services Pager [phone] Office [phone]   Lucille Passy M 04/21/2019, 6:27 AM

## 2019-04-21 NOTE — Social Work (Addendum)
CSW unable to complete sbirt due to pt not being oriented. CSW will attempt at more appropriate times.   Emeterio Reeve, Latanya Presser, [place] Social Worker [phone]

## 2019-04-21 NOTE — Progress Notes (Signed)
EEG completed, results pending. 

## 2019-04-21 NOTE — Progress Notes (Signed)
  Echocardiogram 2D Echocardiogram has been performed.  Doyce Saling A Leyla Soliz 04/21/2019, 1:57 PM

## 2019-04-21 NOTE — Progress Notes (Signed)
Assisted tele visit to patient with family member.  Sharie Amorin M, RN  

## 2019-04-21 NOTE — Progress Notes (Signed)
Pharmacy Antibiotic Note  Nichole Munoz is a 74 y.o. female admitted on 04/20/2019 with sepsis.  Pharmacy has been consulted for Vancomycin and Zosyn dosing.  Plan: Zosyn 3.375gm IV q8h Vancomycin 1250 mg IV Q 48 hrs. Goal AUC 400-550. Expected AUC: 524 SCr used: 1.36 Will f/u renal function, micro data, and pt's clinical condition Vanc levels prn   Height: 5\' 3"  (160 cm) Weight: 56.5 kg (124 lb 9 oz) IBW/kg (Calculated) : 52.4  Temp (24hrs), Avg:101.2 F (38.4 C), Min:98.8 F (37.1 C), Max:102.9 F (39.4 C)  Recent Labs  Lab 04/20/19 2148 04/20/19 2152  WBC  --  20.1*  CREATININE 1.30* 1.36*    Estimated Creatinine Clearance: 30 mL/min (A) (by C-G formula based on SCr of 1.36 mg/dL (H)).    Allergies  Allergen Reactions  . Other Other (See Comments)    Chicken causes itching/  clorox, Bleach causes shortness of breath  . No Known Allergies     Previous listing of unable to tolerate SMELL of Clorox and Chicken REACTION NOT AN ALLERGY     Antimicrobials this admission: 4/15 Vanc >>  4/15 Zosyn >>   Microbiology results:  BCx:   UCx:   Thank you for allowing pharmacy to be a part of this patient's care.  Sherlon Handing, PharmD, BCPS Please see amion for complete clinical pharmacist phone list 04/21/2019 6:14 AM

## 2019-04-21 NOTE — Progress Notes (Signed)
PROGRESS NOTE  Nichole Munoz  ZOX:[id number] DOB: March 24, 1945 DOA: 04/20/2019 PCP: [facility]   Assessment & Plan: Sepsis with unclear source: RVP, covid negative. LLL pneumonia vs. UTI vs. meningitis. Also w/hx diverticulitis though no abdominal distention, or definite tenderness.  - LLL opacity noted though no respiratory symptoms/signs and may be chronic.  - Urinalysis shows pyuria, culture sent.  - No large wounds noted - ?Needs LP. Defer to neurology. D/w Dr. Leonie Man. - Cover with vancomycin and zosyn per pharmacy.  - Needs additional IV fluids given heightened insensible losses and worsening renal function. Sepsis order set initiated including lactic acid, appropriate consults. Will bolus LR 30cc/kg, continue IVF. Lactic acid elevated and stable despite bolus, will repeat bolus and consult PCCM.  IDT2DM with hyperglycemia: No ketonuria.  - CBGs trending back up after gtt stopped. HbA1c shows chronically poor control (9.7%). Will increase PM dose of basal and continue SSI.   Acute urinary retention:  - Insert foley  Acute encephalopathy: Due to stroke vs. sepsis ?meningitis/encephalitis.  - Treat conditions as above.   AKI on stage III CKD:  - Repeat BMP now as creatinine was worsening at last check.  Family Communication: None at bedside this AM. Daughter by E link this PM says she was in normal health this weekend, no other information regarding her current condition.   Antimicrobials: Vancomycin, zosyn   Subjective: Confused, not verbally responsive, agitated. More relaxed this afternoon.  Objective: Vitals:   04/21/19 0715 04/21/19 0820 04/21/19 1045 04/21/19 1107  BP: (!) 154/71     Pulse: (!) 118     Resp: (!) 24     Temp: (!) 102.9 F (39.4 C) (!) 102.9 F (39.4 C) (!) 102.3 F (39.1 C) (!) 102.2 F (39 C)  TempSrc: Rectal  Rectal Axillary  SpO2: 92%     Weight:      Height:        Intake/Output Summary (Last 24 hours) at 04/21/2019  1241 Last data filed at 04/21/2019 1030 Gross per 24 hour  Intake 345.82 ml  Output 1000 ml  Net -654.18 ml   Filed Weights   04/20/19 2100  Weight: 56.5 kg    Gen: 74 y.o. female in no some distress Pulm: Non-labored, supine. Clear to auscultation bilaterally.  CV: Regular tachycardia. No murmur, rub, or gallop. No JVD, no pedal edema. GI: Abdomen soft, no rebound/recoil with palpation with attention to LLQ, non-distended, with normoactive bowel sounds. No organomegaly or masses felt. Ext: Warm, no deformities Skin: No rashes, or large wounds Neuro: Not cooperative with exam, moving all extremities. Psych: UTD.  Data Reviewed: I have personally reviewed following labs and imaging studies  CBC: Recent Labs  Lab 04/20/19 2148 04/20/19 2152 04/21/19 1115  WBC  --  20.1* 20.6*  NEUTROABS  --  15.7*  --   HGB 12.9 13.1 13.9  HCT 38.0 38.0 40.3  MCV  --  88.4 88.4  PLT  --  324 811   Basic Metabolic Panel: Recent Labs  Lab 04/20/19 2148 04/20/19 2152 04/21/19 0838 04/21/19 1115  NA 136 136 140 142  K 4.0 4.0 3.8 4.0  CL 98 95* 98 99  CO2  --  26 21* 21*  GLUCOSE 551* 564* 279* 338*  BUN 35* 33* 36* 39*  CREATININE 1.30* 1.36* 1.54* 1.67*  CALCIUM  --  9.3 8.5* 9.0   GFR: Estimated Creatinine Clearance: 24.4 mL/min (A) (by C-G formula based on SCr of 1.67 mg/dL (  H)). Liver Function Tests: Recent Labs  Lab 04/20/19 2152  AST 18  ALT 13  ALKPHOS 79  BILITOT 0.4  PROT 8.3*  ALBUMIN 4.0   No results for input(s): LIPASE, AMYLASE in the last 168 hours. No results for input(s): AMMONIA in the last 168 hours. Coagulation Profile: Recent Labs  Lab 04/20/19 2152  INR 0.9   Cardiac Enzymes: No results for input(s): CKTOTAL, CKMB, CKMBINDEX, TROPONINI in the last 168 hours. BNP (last 3 results) No results for input(s): PROBNP in the last 8760 hours. HbA1C: Recent Labs    04/21/19 1115  HGBA1C 9.7*   CBG: Recent Labs  Lab 04/21/19 0331 04/21/19 0437  04/21/19 0545 04/21/19 0815 04/21/19 1106  GLUCAP 383* 256* 178* 284* 332*   Lipid Profile: Recent Labs    04/21/19 1115  CHOL 273*  HDL 43  LDLCALC 158*  TRIG 359*  CHOLHDL 6.3   Thyroid Function Tests: No results for input(s): TSH, T4TOTAL, FREET4, T3FREE, THYROIDAB in the last 72 hours. Anemia Panel: No results for input(s): VITAMINB12, FOLATE, FERRITIN, TIBC, IRON, RETICCTPCT in the last 72 hours. Urine analysis:    Component Value Date/Time   COLORURINE STRAW (A) 04/21/2019 1040   APPEARANCEUR CLEAR 04/21/2019 1040   LABSPEC 1.022 04/21/2019 1040   PHURINE 7.0 04/21/2019 1040   GLUCOSEU >=500 (A) 04/21/2019 1040   HGBUR NEGATIVE 04/21/2019 1040   BILIRUBINUR NEGATIVE 04/21/2019 1040   KETONESUR NEGATIVE 04/21/2019 1040   PROTEINUR 100 (A) 04/21/2019 1040   UROBILINOGEN 1.0 09/23/2014 1458   NITRITE NEGATIVE 04/21/2019 1040   LEUKOCYTESUR NEGATIVE 04/21/2019 1040   Recent Results (from the past 240 hour(s))  Respiratory Panel by RT PCR (Flu A&B, Covid) - Nasopharyngeal Swab     Status: None   Collection Time: 04/21/19 12:20 AM   Specimen: Nasopharyngeal Swab  Result Value Ref Range Status   SARS Coronavirus 2 by RT PCR NEGATIVE NEGATIVE Final    Comment: (NOTE) SARS-CoV-2 target nucleic acids are NOT DETECTED. The SARS-CoV-2 RNA is generally detectable in upper respiratoy specimens during the acute phase of infection. The lowest concentration of SARS-CoV-2 viral copies this assay can detect is 131 copies/mL. A negative result does not preclude SARS-Cov-2 infection and should not be used as the sole basis for treatment or other patient management decisions. A negative result may occur with  improper specimen collection/handling, submission of specimen other than nasopharyngeal swab, presence of viral mutation(s) within the areas targeted by this assay, and inadequate number of viral copies (<131 copies/mL). A negative result must be combined with  clinical observations, patient history, and epidemiological information. The expected result is Negative. Fact Sheet for Patients:  PinkCheek.be Fact Sheet for Healthcare Providers:  GravelBags.it This test is not yet ap proved or cleared by the [place] FDA and  has been authorized for detection and/or diagnosis of SARS-CoV-2 by FDA under an Emergency Use Authorization (EUA). This EUA will remain  in effect (meaning this test can be used) for the duration of the COVID-19 declaration under Section 564(b)(1) of the Act, 21 U.S.C. section 360bbb-3(b)(1), unless the authorization is terminated or revoked sooner.    Influenza A by PCR NEGATIVE NEGATIVE Final   Influenza B by PCR NEGATIVE NEGATIVE Final    Comment: (NOTE) The Xpert Xpress SARS-CoV-2/FLU/RSV assay is intended as an aid in  the diagnosis of influenza from Nasopharyngeal swab specimens and  should not be used as a sole basis for treatment. Nasal washings and  aspirates are unacceptable for  Xpert Xpress SARS-CoV-2/FLU/RSV  testing. Fact Sheet for Patients: PinkCheek.be Fact Sheet for Healthcare Providers: GravelBags.it This test is not yet approved or cleared by the [place] FDA and  has been authorized for detection and/or diagnosis of SARS-CoV-2 by  FDA under an Emergency Use Authorization (EUA). This EUA will remain  in effect (meaning this test can be used) for the duration of the  Covid-19 declaration under Section 564(b)(1) of the Act, 21  U.S.C. section 360bbb-3(b)(1), unless the authorization is  terminated or revoked. Performed at [facility], [place] [address] [place], [place] [address]       Radiology Studies: CT ANGIO HEAD W OR WO CONTRAST  Result Date: 04/20/2019 CLINICAL DATA:  Aphasia and leftward gaze EXAM: CT ANGIOGRAPHY HEAD AND NECK TECHNIQUE: Multidetector CT imaging of  the head and neck was performed using the standard protocol during bolus administration of intravenous contrast. Multiplanar CT image reconstructions and MIPs were obtained to evaluate the vascular anatomy. Carotid stenosis measurements (when applicable) are obtained utilizing NASCET criteria, using the distal internal carotid diameter as the denominator. CONTRAST:  47mL OMNIPAQUE IOHEXOL 350 MG/ML SOLN COMPARISON:  Head CT same day FINDINGS: CTA NECK FINDINGS SKELETON: There is no bony spinal canal stenosis. No lytic or blastic lesion. OTHER NECK: Normal pharynx, larynx and major salivary glands. No cervical lymphadenopathy. Unremarkable thyroid gland. UPPER CHEST: No pneumothorax or pleural effusion. No nodules or masses. AORTIC ARCH: There is moderate calcific atherosclerosis of the aortic arch. There is no aneurysm, dissection or hemodynamically significant stenosis of the visualized portion of the aorta. Conventional 3 vessel aortic branching pattern. The visualized proximal subclavian arteries are widely patent. RIGHT CAROTID SYSTEM: No dissection, occlusion or aneurysm. Mild atherosclerotic calcification at the carotid bifurcation without hemodynamically significant stenosis. LEFT CAROTID SYSTEM: No dissection, occlusion or aneurysm. Mild atherosclerotic calcification at the carotid bifurcation without hemodynamically significant stenosis. VERTEBRAL ARTERIES: Left dominant configuration. Both origins are clearly patent. There is no dissection, occlusion or flow-limiting stenosis to the skull base (V1-V3 segments). CTA HEAD FINDINGS POSTERIOR CIRCULATION: --Vertebral arteries: Mild calcification of the V4 segments --Posterior inferior cerebellar arteries (PICA): Patent origins from the vertebral arteries. --Anterior inferior cerebellar arteries (AICA): Patent origins from the basilar artery. --Basilar artery: Diminutive basilar artery. --Superior cerebellar arteries: Normal. --Posterior cerebral arteries:  Normal. Both are predominantly supplied by the posterior communicating arteries (p-comm). ANTERIOR CIRCULATION: --Intracranial internal carotid arteries: Normal. --Anterior cerebral arteries (ACA): Normal. Absent left A1 segment, normal variant --Middle cerebral arteries (MCA): Normal. VENOUS SINUSES: As permitted by contrast timing, patent. ANATOMIC VARIANTS: Fetal origins of both posterior cerebral arteries. Review of the MIP images confirms the above findings. IMPRESSION: No emergent large vessel occlusion or high-grade stenosis of the intracranial arteries. Aortic atherosclerosis (ICD10-I70.0). Electronically Signed   By: Ulyses Jarred M.D.   On: 04/20/2019 23:41   CT ANGIO NECK W OR WO CONTRAST  Result Date: 04/20/2019 CLINICAL DATA:  Aphasia and leftward gaze EXAM: CT ANGIOGRAPHY HEAD AND NECK TECHNIQUE: Multidetector CT imaging of the head and neck was performed using the standard protocol during bolus administration of intravenous contrast. Multiplanar CT image reconstructions and MIPs were obtained to evaluate the vascular anatomy. Carotid stenosis measurements (when applicable) are obtained utilizing NASCET criteria, using the distal internal carotid diameter as the denominator. CONTRAST:  15mL OMNIPAQUE IOHEXOL 350 MG/ML SOLN COMPARISON:  Head CT same day FINDINGS: CTA NECK FINDINGS SKELETON: There is no bony spinal canal stenosis. No lytic or blastic lesion. OTHER NECK: Normal pharynx,  larynx and major salivary glands. No cervical lymphadenopathy. Unremarkable thyroid gland. UPPER CHEST: No pneumothorax or pleural effusion. No nodules or masses. AORTIC ARCH: There is moderate calcific atherosclerosis of the aortic arch. There is no aneurysm, dissection or hemodynamically significant stenosis of the visualized portion of the aorta. Conventional 3 vessel aortic branching pattern. The visualized proximal subclavian arteries are widely patent. RIGHT CAROTID SYSTEM: No dissection, occlusion or aneurysm.  Mild atherosclerotic calcification at the carotid bifurcation without hemodynamically significant stenosis. LEFT CAROTID SYSTEM: No dissection, occlusion or aneurysm. Mild atherosclerotic calcification at the carotid bifurcation without hemodynamically significant stenosis. VERTEBRAL ARTERIES: Left dominant configuration. Both origins are clearly patent. There is no dissection, occlusion or flow-limiting stenosis to the skull base (V1-V3 segments). CTA HEAD FINDINGS POSTERIOR CIRCULATION: --Vertebral arteries: Mild calcification of the V4 segments --Posterior inferior cerebellar arteries (PICA): Patent origins from the vertebral arteries. --Anterior inferior cerebellar arteries (AICA): Patent origins from the basilar artery. --Basilar artery: Diminutive basilar artery. --Superior cerebellar arteries: Normal. --Posterior cerebral arteries: Normal. Both are predominantly supplied by the posterior communicating arteries (p-comm). ANTERIOR CIRCULATION: --Intracranial internal carotid arteries: Normal. --Anterior cerebral arteries (ACA): Normal. Absent left A1 segment, normal variant --Middle cerebral arteries (MCA): Normal. VENOUS SINUSES: As permitted by contrast timing, patent. ANATOMIC VARIANTS: Fetal origins of both posterior cerebral arteries. Review of the MIP images confirms the above findings. IMPRESSION: No emergent large vessel occlusion or high-grade stenosis of the intracranial arteries. Aortic atherosclerosis (ICD10-I70.0). Electronically Signed   By: Ulyses Jarred M.D.   On: 04/20/2019 23:41   DG CHEST PORT 1 VIEW  Result Date: 04/21/2019 CLINICAL DATA:  Stroke symptoms, lethargy, emesis EXAM: PORTABLE CHEST 1 VIEW COMPARISON:  02/19/2019 FINDINGS: Single frontal view of the chest demonstrates a stable cardiac silhouette. Continued retrocardiac consolidation unchanged. No new airspace disease, effusion, or pneumothorax. No acute bony abnormality. IMPRESSION: 1. Persistent left lower lobe consolidation  likely scarring. 2. No acute intrathoracic process. Electronically Signed   By: Randa Ngo M.D.   On: 04/21/2019 02:37   CT HEAD CODE STROKE WO CONTRAST  Result Date: 04/20/2019 CLINICAL DATA:  Code stroke.  Aphasia and left gaze EXAM: CT HEAD WITHOUT CONTRAST TECHNIQUE: Contiguous axial images were obtained from the base of the skull through the vertex without intravenous contrast. COMPARISON:  01/24/2017 FINDINGS: Brain: Imaging of the pons and posterior fossa is degraded by motion. there is no mass, hemorrhage or extra-axial collection. The size and configuration of the ventricles and extra-axial CSF spaces are normal. There is hypoattenuation of the periventricular white matter, most commonly indicating chronic ischemic microangiopathy. Vascular: No abnormal hyperdensity of the major intracranial arteries or dural venous sinuses. No intracranial atherosclerosis. Skull: The visualized skull base, calvarium and extracranial soft tissues are normal. Sinuses/Orbits: No fluid levels or advanced mucosal thickening of the visualized paranasal sinuses. No mastoid or middle ear effusion. The orbits are normal. ASPECTS [facility] Stroke Program Early CT Score) - Ganglionic level infarction (caudate, lentiform nuclei, internal capsule, insula, M1-M3 cortex): 7 - Supraganglionic infarction (M4-M6 cortex): 3 Total score (0-10 with 10 being normal): 10 IMPRESSION: 1. Motion degraded study without acute abnormality. 2. ASPECTS is 10. These results were communicated to Dr. Kerney Elbe at 10:11 pm on 04/20/2019 by text page via the [facility] messaging system. Electronically Signed   By: Ulyses Jarred M.D.   On: 04/20/2019 22:11    Scheduled Meds:   stroke: mapping our early stages of recovery book   Does not apply Once   Chlorhexidine Gluconate Cloth  6 each Topical Daily   insulin aspart  0-15 Units Subcutaneous TID WC   insulin aspart  0-5 Units Subcutaneous QHS   insulin glargine  15 Units Subcutaneous Once    insulin glargine  30 Units Subcutaneous BID   pantoprazole (PROTONIX) IV  40 mg Intravenous QHS   Continuous Infusions:  sodium chloride 75 mL/hr at 04/21/19 0104   sodium chloride 250 mL (04/21/19 0851)   clevidipine 4 mg/hr (04/21/19 1039)   piperacillin-tazobactam (ZOSYN)  IV     [START ON 04/23/2019] vancomycin       LOS: 1 day   Time spent: 35 minutes.  Patrecia Pour, MD [facility] [email or web address] 04/21/2019, 12:41 PM

## 2019-04-21 NOTE — Progress Notes (Signed)
PT Cancellation Note  Patient Details Name: Nichole Munoz MRN: [id number] DOB: 01-31-1945   Cancelled Treatment:    Reason Eval/Treat Not Completed: Patient not medically ready;Other (comment)(pt also not able to interact with her own family)   Burnard Bunting 04/21/2019, 1:49 PM
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
## 2019-04-21 NOTE — Progress Notes (Addendum)
Code sepsis called. Pt was admitted on 4/14 and had already been started on zosyn prior to code sepsis being called. Reached out to bedside RN to make sure they were aware code sepsis had been called and encouraged to call [facility] for any questions regarding code sepsis. Elink will continue to follow for 6 hrs.

## 2019-04-21 NOTE — Progress Notes (Signed)
Dr. Cheral Marker rounding, no new orders received.

## 2019-04-22 ENCOUNTER — Inpatient Hospital Stay (HOSPITAL_COMMUNITY): Payer: Medicare Other

## 2019-04-22 DIAGNOSIS — R6520 Severe sepsis without septic shock: Secondary | ICD-10-CM | POA: Diagnosis not present

## 2019-04-22 DIAGNOSIS — N179 Acute kidney failure, unspecified: Secondary | ICD-10-CM

## 2019-04-22 DIAGNOSIS — A419 Sepsis, unspecified organism: Secondary | ICD-10-CM | POA: Diagnosis not present

## 2019-04-22 LAB — CSF CELL COUNT WITH DIFFERENTIAL
RBC Count, CSF: 1 /mm3 — ABNORMAL HIGH
Tube #: 3
WBC, CSF: 6 /mm3 — ABNORMAL HIGH (ref 0–5)

## 2019-04-22 LAB — BASIC METABOLIC PANEL
Anion gap: 13 (ref 5–15)
BUN: 35 mg/dL — ABNORMAL HIGH (ref 8–23)
CO2: 25 mmol/L (ref 22–32)
Calcium: 8.3 mg/dL — ABNORMAL LOW (ref 8.9–10.3)
Chloride: 107 mmol/L (ref 98–111)
Creatinine, Ser: 1.37 mg/dL — ABNORMAL HIGH (ref 0.44–1.00)
GFR calc Af Amer: 44 mL/min — ABNORMAL LOW (ref >60)
GFR calc non Af Amer: 38 mL/min — ABNORMAL LOW (ref >60)
Glucose, Bld: 104 mg/dL — ABNORMAL HIGH (ref 70–99)
Potassium: 3.7 mmol/L (ref 3.5–5.1)
Sodium: 145 mmol/L (ref 135–145)

## 2019-04-22 LAB — GLUCOSE, CAPILLARY
Glucose-Capillary: 116 mg/dL — ABNORMAL HIGH (ref 70–99)
Glucose-Capillary: 122 mg/dL — ABNORMAL HIGH (ref 70–99)
Glucose-Capillary: 133 mg/dL — ABNORMAL HIGH (ref 70–99)
Glucose-Capillary: 168 mg/dL — ABNORMAL HIGH (ref 70–99)
Glucose-Capillary: 182 mg/dL — ABNORMAL HIGH (ref 70–99)
Glucose-Capillary: 202 mg/dL — ABNORMAL HIGH (ref 70–99)
Glucose-Capillary: 90 mg/dL (ref 70–99)

## 2019-04-22 LAB — GLUCOSE, CSF: Glucose, CSF: 71 mg/dL — ABNORMAL HIGH (ref 40–70)

## 2019-04-22 LAB — PROCALCITONIN: Procalcitonin: 31.14 ng/mL

## 2019-04-22 LAB — PROTEIN, CSF: Total  Protein, CSF: 66 mg/dL — ABNORMAL HIGH (ref 15–45)

## 2019-04-22 MED ORDER — ATORVASTATIN CALCIUM 40 MG PO TABS
40.0000 mg | ORAL_TABLET | Freq: Every day | ORAL | Status: DC
  Administered 2019-04-22 – 2019-04-28 (×7): 40 mg via ORAL
  Filled 2019-04-22 (×8): qty 1

## 2019-04-22 MED ORDER — ASPIRIN 81 MG PO CHEW: 81.0000 mg | CHEWABLE_TABLET | Freq: Every day | ORAL | Status: DC

## 2019-04-22 MED ORDER — ASPIRIN EC 81 MG PO TBEC
81.0000 mg | DELAYED_RELEASE_TABLET | Freq: Every day | ORAL | Status: DC
  Administered 2019-04-22 – 2019-04-28 (×7): 81 mg via ORAL
  Filled 2019-04-22 (×8): qty 1

## 2019-04-22 MED ORDER — ACETAMINOPHEN 650 MG RE SUPP: 650.0000 mg | RECTAL | Status: DC | PRN

## 2019-04-22 MED ORDER — ACETAMINOPHEN 160 MG/5ML PO SOLN: 650.0000 mg | ORAL | Status: DC | PRN

## 2019-04-22 MED ORDER — LIDOCAINE HCL (PF) 1 % IJ SOLN
5.0000 mL | Freq: Once | INTRAMUSCULAR | Status: AC
  Administered 2019-04-22: 5 mL via INTRADERMAL

## 2019-04-22 MED ORDER — ACETAMINOPHEN 325 MG PO TABS
650.0000 mg | ORAL_TABLET | ORAL | Status: DC | PRN
  Administered 2019-04-24 – 2019-04-27 (×2): 650 mg via ORAL
  Filled 2019-04-22 (×2): qty 2

## 2019-04-22 MED ORDER — ENOXAPARIN SODIUM 40 MG/0.4ML ~~LOC~~ SOLN
30.0000 mg | SUBCUTANEOUS | Status: DC
  Administered 2019-04-22 – 2019-04-24 (×3): 30 mg via SUBCUTANEOUS
  Filled 2019-04-22 (×3): qty 0.4

## 2019-04-22 NOTE — Progress Notes (Addendum)
STROKE TEAM PROGRESS NOTE   INTERVAL HISTORY Her son who speaks english and [place]ian is at the bedside. The pt seems much improved today.  She is afebrile and responsive.  She is able to follow commands when translated by her son and speaks in what appears to be clear but slightly hesitant voice.  She is able to move all 4 extremities against gravity.  She just returned from spinal tap which show slightly elevated proteins of 66 mg percent and normal glucose at 71 mg percent.  There were only 6 white cells with one red cell and appearance of CSF is clear making bacterial meningitis less likely.  MRI scan yesterday  showed a faintly diffusion positive subacute infarct in the left medial temporal lobe without corresponding ADC correlate in an old left occipital infarct.  Long-term EEG monitoring is in process report is pending Vitals:   04/22/19 0400 04/22/19 0500 04/22/19 0700 04/22/19 1100  BP: 131/65 (!) 107/58    Pulse: 83 76    Resp: 12 13    Temp:   98 F (36.7 C) 98 F (36.7 C)  TempSrc:   Oral Axillary  SpO2: 97% 98%    Weight:      Height:        CBC:  Recent Labs  Lab 04/20/19 2152 04/21/19 1115  WBC 20.1* 20.6*  NEUTROABS 15.7*  --   HGB 13.1 13.9  HCT 38.0 40.3  MCV 88.4 88.4  PLT 324 366    Basic Metabolic Panel:  Recent Labs  Lab 04/21/19 1902 04/22/19 0254  NA 144 145  K 3.6 3.7  CL 105 107  CO2 23 25  GLUCOSE 80 104*  BUN 37* 35*  CREATININE 1.46* 1.37*  CALCIUM 8.6* 8.3*   Lipid Panel:     Component Value Date/Time   CHOL 273 (H) 04/21/2019 1115   TRIG 359 (H) 04/21/2019 1115   HDL 43 04/21/2019 1115   CHOLHDL 6.3 04/21/2019 1115   VLDL 72 (H) 04/21/2019 1115   LDLCALC 158 (H) 04/21/2019 1115   HgbA1c:  Lab Results  Component Value Date   HGBA1C 9.7 (H) 04/21/2019   Urine Drug Screen: No results found for: LABOPIA, COCAINSCRNUR, LABBENZ, AMPHETMU, THCU, LABBARB  Alcohol Level     Component Value Date/Time   ETH <5 04/25/2016 0957     IMAGING past 24 hours MR BRAIN WO CONTRAST  Result Date: 04/21/2019 CLINICAL DATA:  Stroke follow-up. Status post tPA. EXAM: MRI HEAD WITHOUT CONTRAST TECHNIQUE: Multiplanar, multiecho pulse sequences of the brain and surrounding structures were obtained without intravenous contrast. COMPARISON:  Brain MRI 06/29/2014 CTA head 04/20/2018 FINDINGS: Brain: Small focus hyperintensity on diffusion-weighted imaging within medial left temporal lobe without definite correlate ADC map. No other diffusion abnormality. There is an old left occipital lobe infarct. There is multifocal periventricular white matter hyperintensity, most often a result of chronic microvascular ischemia. There is generalized atrophy without lobar predilection. No chronic microhemorrhage. Incidentally noted cavum septum pellucidum et vergae Vascular: Normal flow voids. Skull and upper cervical spine: Normal marrow signal. Sinuses/Orbits: Bilateral mastoid fluid. Paranasal sinuses are clear. Normal orbits. Other: None IMPRESSION: 1. Small subacute infarct of the medial left temporal lobe. 2. No hemorrhage or mass effect. 3. 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 Electronically Signed   By: Ulyses Jarred M.D.   On: 04/21/2019 22:24   DG FL GUIDED LUMBAR PUNCTURE  Result Date: 04/22/2019 CLINICAL DATA:  Fever and leukocytosis. Clinical concern  for meningitis. EXAM: DIAGNOSTIC LUMBAR PUNCTURE UNDER FLUOROSCOPIC GUIDANCE FLUOROSCOPY TIME:  Fluoroscopy Time: 6 seconds of low-dose pulsed fluoroscopy Radiation Exposure Index (if provided by the fluoroscopic device): 0.6 mGy Number of Acquired Spot Images: 0 PROCEDURE: Standard time-out was employed. I discussed the risks (including hemorrhage, infection, headache, and nerve damage, among others), benefits, and alternatives to fluoroscopically guided lumbar puncture with the patient's daughter by telephone prior to the procedure. We specifically discussed  the high technical likelihood of success of the procedure. The daughter understood and elected to undergo the procedure. An appropriate site for lumbar puncture was marked on the patient's skin under fluoroscopic guidance. Following sterile skin prep and local anesthetic administration consisting of 1 percent lidocaine, a 22 gauge spinal needle was advanced without difficulty into the thecal sac at the at the L2-3 level under fluoroscopic guidance. Clear CSF was returned. Opening pressure was 13 cm measured prone through the needle. 12 cc of clear CSF was collected. The needle was subsequently removed and the skin cleansed and bandaged. No immediate complications were observed. IMPRESSION: Diagnostic lumbar puncture without immediate complications. Electronically Signed   By: Richardean Sale M.D.   On: 04/22/2019 11:30  EEG: This technically difficult study is suggestive of cortical dysfunction in left hemisphere, nonspecific etiology but could be secondary to postictal state. No seizures or definite epileptiform discharges were seen throughout the recording.  PHYSICAL EXAM GENERAL: critically ill, malnourished, frail and elderly appearing HEENT-  [place]/AT. Neck supple  Cardiovascular - Sinus tachycardia Lungs - Respirations unlabored. CTA. Abdomen - Non-distended Extremities - No edema  Neurologic Examination: Obtunded. Nonverbal. Not following any commands. Eyes close tightly when attempting to examine. Remained in fetal position without attempt to move extremities during exam.  Unable to test blink to threat or EOMs since eyes do not stay open. Eyes with upward gaze deviation. Grimace to sternal rub. Unable to detect clear facial weakness. Unable to assess hearing. Head preferentially rotated to the left. Unable to test tongue protrusion. Tone is increased throughout. Cerebellar/Gait: Unable to assess  ASSESSMENT/PLAN [name] is a 74 y.o. female presenting with acute onset of decreased  responsiveness, left gaze deviation and aphasia. She was treated with IV tPA.  She likely presented with a seizure and postictal state in the setting of urosepsis.  She has a subacute left medial temporal as well as old left occipital infarct etiology to be determined but likely cardioembolic or cryptogenic  Code Stroke: CT head No acute abnormality. ASPECTS 10.   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  No acute abnormality  2D Echo normal ejection fraction without clear cardiac source of embolism.  LDL 158 mg percent.  HgbA1c 10.5  Scd's for VTE prophylaxis for now (s/p tPA)    Diet   Diet NPO time specified    None prior to admission, now on s/p tPA  Therapy recommendations:  pending  Disposition:  pending  Hypertension  Home meds:  zestiril  Stable . Long-term BP goal normotensive  Hyperlipidemia  Home meds:none  LDL No results found for requested labs within last 26280 hours., goal < 70  Add lipitor pending result  Continue statin at discharge  Diabetes type II Uncontrolled  Home meds:  Glucotrol, novolog, lantus, glucophage, tradjenta  HgbA1c 10.5, goal < 7.0  CBGs Recent Labs    04/22/19 0344 04/22/19 0704 04/22/19 1147  GLUCAP 133* 116* 122*  SSI  Other Stroke Risk Factors  Advanced age  Other Active Problems  Septic Shock- Given her critical illness, it is difficult to discern if stroke is contributing or not to her obtunded state. Currently it is unclear if pt has had a stroke. EEG did not show seizures. IV abx started  Language barrier- pt is non-English speaking. We are not able to get a [place] translator at this time. Family has left bedside at time of our exam.  Hospital day # 2      .  She presented with aphasia and unresponsiveness in the setting of high fever, hyperglycemia and hypotension and sepsis.  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  Recommend antibiotics and IV fluids for stabilization.  Spinal tap does not show evidence of bacterial meningitis.  Await long-term EEG monitoring results and if no definite seizure activity is noted will discontinue it later today.  Mobilize out of bed.  Therapy consults.  Speech therapy for swallow eval.  Recommend start aspirin 81 mg daily for stroke prevention alone given subacute and chronic nature of her strokes.  Add Lipitor 40 mg and aggressive blood pressure and diabetes control.  Consider outpatient loop recorder for paroxysmal A. fib.  Recommend transfer to medical hospitalist service starting 04/23/19 discussed with Dr. Elsworth Soho critical care medicine and.   with Dr. Vance Gather medical hospitalist.This patient is critically ill and at significant risk of neurological worsening, death and care requires constant monitoring of vital signs, hemodynamics,respiratory and cardiac monitoring, extensive review of multiple databases, frequent neurological assessment, discussion with family, other specialists and medical decision making of high complexity.I have made any additions or clarifications directly to the above note.This critical care time does not reflect procedure time, or teaching time or supervisory time of PA/NP/Med Resident etc but could involve care discussion time.  I spent 30 minutes of neurocritical care time  in the care of  this patient.      Antony Contras, MD Medical Director [facility] Pager: [phone] 04/22/2019 2:02 PM  To contact Stroke Continuity provider, please refer to [email or web address] After hours, contact General Neurology

## 2019-04-22 NOTE — Evaluation (Signed)
Physical Therapy Evaluation Patient Details Name: Nichole Munoz MRN: [id number] DOB: 31-Aug-1945 Today's Date: 04/22/2019   History of Present Illness  Nichole Munoz is a 74 y/o female from [place] with pmhx significant for DM, neuropathy, HTN, arhtritis admitted after family found her down and nonverbal.  EMS noted L gaze deviation and aphasia.  Code stroke called, pt received tPA.  CT showed no acute brain abnormality.  Pt was hyperglycemic and febrile, so may have been a pseudo-stroke from sepsis given her profound encephalopathy  Clinical Impression  Pt admitted with/for s/s of stroke.  TPA given.  On evaluation, pt appeared to have recovered somewhat needing min assist to supervision for basic mobility, but pt was unable to get away from the bed to check gait due to EEG setup.  Pt currently limited functionally due to the problems listed. ( See problems list.)   Pt will benefit from PT to maximize function and safety in order to get ready for next venue listed below.     Follow Up Recommendations Home health PT;Supervision/Assistance - 24 hour    Equipment Recommendations  None recommended by PT    Recommendations for Other Services       Precautions / Restrictions Precautions Precautions: Fall Precaution Comments: pt having continuous EEG during eval      Mobility  Bed Mobility Overal bed mobility: Needs Assistance Bed Mobility: Supine to Sit     Supine to sit: Supervision     General bed mobility comments: pt coming to EOB with the lines still not untangled, but no assist needed.  Transfers Overall transfer level: Needs assistance   Transfers: Sit to/from Stand;Stand Pivot Transfers Sit to Stand: Min guard Stand pivot transfers: Min assist       General transfer comment: assist for safety only, pt very steady.  unable to ambulate due to EEG being conducted.  Ambulation/Gait             General Gait Details: unable due to tethered to EEG leads.  Stairs             Wheelchair Mobility    Modified Rankin (Stroke Patients Only) Modified Rankin (Stroke Patients Only) Pre-Morbid Rankin Score: No symptoms Modified Rankin: No symptoms     Balance Overall balance assessment: Needs assistance Sitting-balance support: No upper extremity supported Sitting balance-Leahy Scale: Fair     Standing balance support: No upper extremity supported;Single extremity supported Standing balance-Leahy Scale: Fair                               Pertinent Vitals/Pain Pain Assessment: Faces Faces Pain Scale: No hurt Pain Intervention(s): Monitored during session    Home Living Family/patient expects to be discharged to:: Private residence Living Arrangements: Children(son)               Additional Comments: Pt lives with a son who works, 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, son not available    Prior Function           Comments: no family present to learn PLOF and interpreter tablet is not working.,     Journalist, newspaper   Dominant Hand: Right    Extremity/Trunk Assessment   Upper Extremity Assessment Upper Extremity Assessment: Overall WFL for tasks assessed;LUE deficits/detail(unable to MMT due to language barrier) LUE Deficits / Details: pt points to scar on wrist, appears to be an old surgical incision, can perform gross grasp LUE Coordination: decreased fine motor(?)  Lower Extremity Assessment Lower Extremity Assessment: Defer to PT evaluation    Cervical / Trunk Assessment Cervical / Trunk Assessment: Normal  Communication   Communication: Prefers language other than English(Cambodian, says "thank you" in [place])  Cognition Arousal/Alertness: Awake/alert Behavior During Therapy: WFL for tasks assessed/performed Overall Cognitive Status: Difficult to assess                                        General Comments General comments (skin integrity, edema, etc.): vss    Exercises      Assessment/Plan    PT Assessment Patient needs continued PT services  PT Problem List Decreased activity tolerance;Decreased balance;Decreased mobility;Decreased coordination       PT Treatment Interventions DME instruction;Gait training;Stair training;Functional mobility training;Therapeutic activities;Balance training;Patient/family education;Neuromuscular re-education    PT Goals (Current goals can be found in the Care Plan section)  Acute Rehab PT Goals Patient Stated Goal: pt not able to relate her goals today. PT Goal Formulation: With patient Time For Goal Achievement: 05/06/19 Potential to Achieve Goals: Good    Frequency Min 3X/week   Barriers to discharge        Co-evaluation PT/OT/SLP Co-Evaluation/Treatment: Yes Reason for Co-Treatment: For patient/therapist safety PT goals addressed during session: Mobility/safety with mobility OT goals addressed during session: ADL's and self-care       AM-PAC PT "6 Clicks" Mobility  Outcome Measure Help needed turning from your back to your side while in a flat bed without using bedrails?: A Little Help needed moving from lying on your back to sitting on the side of a flat bed without using bedrails?: A Little Help needed moving to and from a bed to a chair (including a wheelchair)?: A Little Help needed standing up from a chair using your arms (e.g., wheelchair or bedside chair)?: A Little Help needed to walk in hospital room?: A Little Help needed climbing 3-5 steps with a railing? : A Little 6 Click Score: 18    End of Session   Activity Tolerance: Patient tolerated treatment well Patient left: in chair;with call bell/phone within reach Nurse Communication: Mobility status PT Visit Diagnosis: Unsteadiness on feet (R26.81);Other symptoms and signs involving the nervous system (R29.898)    Time: 6203-5597 PT Time Calculation (min) (ACUTE ONLY): 23 min   Charges:   PT Evaluation $PT Eval Moderate Complexity: 1  Mod          04/22/2019  Ginger Carne., PT Acute Rehabilitation Services [phone]  (pager) [phone]  (office)  Tessie Fass Dishawn Bhargava 04/22/2019, 6:17 PM

## 2019-04-22 NOTE — Progress Notes (Signed)
Inpatient Diabetes Program Recommendations  AACE/ADA: New Consensus Statement on Inpatient Glycemic Control (2015)  Target Ranges:  Prepandial:   less than 140 mg/dL      Peak postprandial:   less than 180 mg/dL (1-2 hours)      Critically ill patients:  140 - 180 mg/dL   Lab Results  Component Value Date   GLUCAP 122 (H) 04/22/2019   HGBA1C 9.7 (H) 04/21/2019    Review of Glycemic Control Results for Nichole Munoz, Nichole Munoz (MRN [id number]) as of 04/22/2019 11:52  Ref. Range 04/21/2019 11:06 04/21/2019 15:23 04/21/2019 19:26 04/21/2019 23:17 04/22/2019 01:11 04/22/2019 03:44 04/22/2019 07:04 04/22/2019 11:47  Glucose-Capillary Latest Ref Range: 70 - 99 mg/dL 332 (H) 120 (H) 68 (L) 68 (L) 90 133 (H) 116 (H) 122 (H)   Inpatient Diabetes Program Recommendations:   -Decrease Novolog correction to sensitive tid  Thank you, Nani Gasser. Geraldin Habermehl, RN, MSN, CDE  Diabetes Coordinator Inpatient Glycemic Control Team Team Pager [phone] (8am-5pm) 04/22/2019 11:53 AM

## 2019-04-22 NOTE — Evaluation (Signed)
Occupational Therapy Evaluation Patient Details Name: Nichole Munoz MRN: [id number] DOB: September 21, 1945 Today's Date: 04/22/2019    History of Present Illness Nichole Munoz is a 74 y/o female from [place] with pmhx significant for DM, neuropathy, HTN, arhtritis admitted after family found her down and nonverbal.  EMS noted L gaze deviation and aphasia.  Code stroke called, pt received tPA.  CT showed no acute brain abnormality.  Pt was hyperglycemic and febrile, so may have been a pseudo-stroke from sepsis given her profound encephalopathy   Clinical Impression   Pt is from home where she lives with her son. Unable to gather any further information due to inaccessibility of an interpreter or her son. Pt pleasant and following gestures. No appreciable strength discrepancy in R vs L side. Pt with some mild balance impairment. Difficult to assess cognition due to language barrier. Pt generally requiring min assist for ADL and OOB to chair. Did not ambulate pt this visit due to continuous EEG. Will follow acutely.    Follow Up Recommendations  Home health OT;Supervision/Assistance - 24 hour(initially)    Equipment Recommendations  Other (comment)(TBD)    Recommendations for Other Services       Precautions / Restrictions Precautions Precautions: Fall Precaution Comments: pt having continuous EEG during eval      Mobility Bed Mobility Overal bed mobility: Needs Assistance Bed Mobility: Supine to Sit     Supine to sit: Supervision     General bed mobility comments: pt coming to EOB with the lines still not untangled, but no assist needed.  Transfers Overall transfer level: Needs assistance   Transfers: Sit to/from Stand;Stand Pivot Transfers Sit to Stand: Min guard Stand pivot transfers: Min assist       General transfer comment: assist for safety only, pt very steady.  unable to ambulate due to EEG being conducted.    Balance Overall balance assessment: Needs  assistance Sitting-balance support: No upper extremity supported Sitting balance-Leahy Scale: Fair     Standing balance support: No upper extremity supported;Single extremity supported Standing balance-Leahy Scale: Fair                             ADL either performed or assessed with clinical judgement   ADL Overall ADL's : Needs assistance/impaired Eating/Feeding: Set up;Sitting   Grooming: Brushing hair;Sitting;Set up   Upper Body Bathing: Supervision/ safety;Set up;Sitting   Lower Body Bathing: Sitting/lateral leans;Minimal assistance   Upper Body Dressing : Minimal assistance;Sitting   Lower Body Dressing: Minimal assistance;Sit to/from stand Lower Body Dressing Details (indicate cue type and reason): assist to start socks over feet, then pulls them up Toilet Transfer: Minimal assistance;Stand-pivot           Functional mobility during ADLs: Minimal assistance(stood and pivoted to chair only)       Vision Baseline Vision/History: No visual deficits Patient Visual Report: No change from baseline       Perception     Praxis      Pertinent Vitals/Pain Pain Assessment: Faces Faces Pain Scale: No hurt Pain Intervention(s): Monitored during session     Hand Dominance Right   Extremity/Trunk Assessment Upper Extremity Assessment Upper Extremity Assessment: Overall WFL for tasks assessed;LUE deficits/detail(unable to MMT due to language barrier) LUE Deficits / Details: pt points to scar on wrist, appears to be an old surgical incision, can perform gross grasp LUE Coordination: decreased fine motor(?)   Lower Extremity Assessment Lower Extremity Assessment: Defer to PT  evaluation   Cervical / Trunk Assessment Cervical / Trunk Assessment: Normal   Communication Communication Communication: Prefers language other than English(Camb[place]ian, says "thank you" in [place])   Cognition Arousal/Alertness: Awake/alert Behavior During Therapy: WFL for  tasks assessed/performed Overall Cognitive Status: Difficult to assess                                     General Comments  vss    Exercises     Shoulder Instructions      Home Living Family/patient expects to be discharged to:: Private residence Living Arrangements: Children(son)                               Additional Comments: Pt lives with a son who works, 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 OT Goal Formulation: Patient unable to participate in goal setting Time For Goal Achievement: 05/06/19 Potential to Achieve Goals: Good ADL Goals Pt Will Perform Grooming: standing;with supervision Pt Will Perform Upper Body Bathing: with set-up;sitting Pt Will Perform Lower Body Bathing: with supervision;sit to/from stand Pt Will Perform Upper Body Dressing: with set-up;sitting Pt Will Perform Lower Body Dressing: with supervision;sit to/from stand Pt Will Transfer to Toilet: with supervision;ambulating Pt Will Perform Toileting - Clothing Manipulation and hygiene: with supervision;sit to/from stand  OT Frequency: Min 2X/week   Barriers to D/C:            Co-evaluation PT/OT/SLP Co-Evaluation/Treatment: Yes Reason for Co-Treatment: For patient/therapist safety PT goals addressed during session: Mobility/safety with mobility OT goals addressed during session: ADL's and self-care      AM-PAC OT "6 Clicks" Daily Activity     Outcome Measure Help from another person  eating meals?: None Help from another person taking care of personal grooming?: A Little Help from another person toileting, which includes using toliet, bedpan, or urinal?: A Little Help from another person bathing (including washing, rinsing, drying)?: A Little Help from another person to put on and taking off regular upper body clothing?: A Little Help from another person to put on and taking off regular lower body clothing?: A Little 6 Click Score: 19   End of Session Nurse Communication: Mobility status  Activity Tolerance: Patient tolerated treatment well Patient left: in chair;with call bell/phone within reach;with chair alarm set  OT Visit Diagnosis: Unsteadiness on feet (R26.81);Other abnormalities of gait and mobility (R26.89);Muscle weakness (generalized) (M62.81)                Time: 1779-3903 OT Time Calculation (min): 23 min Charges:  OT General Charges $OT Visit: 1 Visit OT Evaluation $OT Eval Moderate Complexity: 1 Mod  Nestor Lewandowsky, OTR/L Acute Rehabilitation Services Pager: [phone] Office: [phone]  Malka So 04/22/2019, 3:46 PM
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
## 2019-04-22 NOTE — Progress Notes (Signed)
LTM EEG hooked up and running - no initial skin breakdown - push button tested - neuro notified.  

## 2019-04-22 NOTE — Procedures (Signed)
Diagnostic lumbar puncture performed at L2/3 on right. Fluid clear and sent for labs. See radiology report for details.
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
## 2019-04-22 NOTE — Progress Notes (Signed)
OT Cancellation Note  Patient Details Name: Nichole Munoz MRN: [id number] DOB: 1945-02-20   Cancelled Treatment:    Reason Eval/Treat Not Completed: Active bedrest order  Malka So 04/22/2019, 8:36 AM  Nestor Lewandowsky, OTR/L Acute Rehabilitation Services Pager: [phone] Office: [phone]

## 2019-04-23 LAB — BASIC METABOLIC PANEL
Anion gap: 13 (ref 5–15)
Anion gap: 12 (ref 5–15)
BUN: 20 mg/dL (ref 8–23)
BUN: 22 mg/dL (ref 8–23)
CO2: 23 mmol/L (ref 22–32)
CO2: 24 mmol/L (ref 22–32)
Calcium: 8.4 mg/dL — ABNORMAL LOW (ref 8.9–10.3)
Calcium: 8.2 mg/dL — ABNORMAL LOW (ref 8.9–10.3)
Chloride: 106 mmol/L (ref 98–111)
Chloride: 106 mmol/L (ref 98–111)
Creatinine, Ser: 1.11 mg/dL — ABNORMAL HIGH (ref 0.44–1.00)
Creatinine, Ser: 1.24 mg/dL — ABNORMAL HIGH (ref 0.44–1.00)
GFR calc Af Amer: 57 mL/min — ABNORMAL LOW (ref >60)
GFR calc Af Amer: 50 mL/min — ABNORMAL LOW (ref >60)
GFR calc non Af Amer: 49 mL/min — ABNORMAL LOW (ref >60)
GFR calc non Af Amer: 43 mL/min — ABNORMAL LOW (ref >60)
Glucose, Bld: 112 mg/dL — ABNORMAL HIGH (ref 70–99)
Glucose, Bld: 163 mg/dL — ABNORMAL HIGH (ref 70–99)
Potassium: 3 mmol/L — ABNORMAL LOW (ref 3.5–5.1)
Potassium: 3.5 mmol/L (ref 3.5–5.1)
Sodium: 142 mmol/L (ref 135–145)
Sodium: 142 mmol/L (ref 135–145)

## 2019-04-23 LAB — URINE CULTURE: Culture: >=100000 — AB

## 2019-04-23 LAB — GLUCOSE, CAPILLARY
Glucose-Capillary: 119 mg/dL — ABNORMAL HIGH (ref 70–99)
Glucose-Capillary: 127 mg/dL — ABNORMAL HIGH (ref 70–99)
Glucose-Capillary: 183 mg/dL — ABNORMAL HIGH (ref 70–99)
Glucose-Capillary: 193 mg/dL — ABNORMAL HIGH (ref 70–99)
Glucose-Capillary: 208 mg/dL — ABNORMAL HIGH (ref 70–99)
Glucose-Capillary: 153 mg/dL — ABNORMAL HIGH (ref 70–99)
Glucose-Capillary: 148 mg/dL — ABNORMAL HIGH (ref 70–99)

## 2019-04-23 LAB — CBC
HCT: 37.9 % (ref 36.0–46.0)
Hemoglobin: 13.1 g/dL (ref 12.0–15.0)
MCH: 30.3 pg (ref 26.0–34.0)
MCHC: 34.6 g/dL (ref 30.0–36.0)
MCV: 87.5 fL (ref 80.0–100.0)
Platelets: 204 10*3/uL (ref 150–400)
RBC: 4.33 MIL/uL (ref 3.87–5.11)
RDW: 11.5 % (ref 11.5–15.5)
WBC: 14.3 10*3/uL — ABNORMAL HIGH (ref 4.0–10.5)
nRBC: 0 % (ref 0.0–0.2)

## 2019-04-23 LAB — PROCALCITONIN: Procalcitonin: 16.42 ng/mL

## 2019-04-23 MED ORDER — POTASSIUM CHLORIDE 10 MEQ/100ML IV SOLN: 10.0000 meq | INTRAVENOUS | Status: DC

## 2019-04-23 MED ORDER — INSULIN ASPART 100 UNIT/ML ~~LOC~~ SOLN
2.0000 [IU] | Freq: Three times a day (TID) | SUBCUTANEOUS | Status: DC
  Administered 2019-04-24 – 2019-04-26 (×6): 2 [IU] via SUBCUTANEOUS

## 2019-04-23 MED ORDER — SODIUM CHLORIDE 0.9 % IV SOLN
1.0000 g | INTRAVENOUS | Status: AC
  Administered 2019-04-24 – 2019-04-27 (×4): 1 g via INTRAVENOUS
  Filled 2019-04-23: qty 10
  Filled 2019-04-23: qty 1
  Filled 2019-04-23 (×2): qty 10

## 2019-04-23 MED ORDER — AMLODIPINE BESYLATE 5 MG PO TABS
5.0000 mg | ORAL_TABLET | Freq: Every day | ORAL | Status: DC
  Administered 2019-04-23 – 2019-04-28 (×6): 5 mg via ORAL
  Filled 2019-04-23 (×7): qty 1

## 2019-04-23 MED ORDER — SODIUM CHLORIDE 0.9 % IV SOLN
1.0000 g | INTRAVENOUS | Status: DC
  Filled 2019-04-23: qty 10

## 2019-04-23 MED ORDER — LISINOPRIL 10 MG PO TABS
10.0000 mg | ORAL_TABLET | Freq: Every day | ORAL | Status: DC
  Administered 2019-04-23 – 2019-04-25 (×3): 10 mg via ORAL
  Filled 2019-04-23 (×4): qty 1

## 2019-04-23 MED ORDER — POTASSIUM CHLORIDE 20 MEQ/15ML (10%) PO SOLN
40.0000 meq | Freq: Once | ORAL | Status: AC
  Administered 2019-04-23: 40 meq via ORAL
  Filled 2019-04-23: qty 30

## 2019-04-23 NOTE — Progress Notes (Signed)
[facility] ADULT ICU REPLACEMENT PROTOCOL FOR AM LAB REPLACEMENT ONLY  The patient does apply for the [facility] Adult ICU Electrolyte Replacment Protocol based on the criteria listed below:   1. Is GFR >/= 40 ml/min? Yes.    Patient's GFR today is 49 2. Is urine output >/= 0.5 ml/kg/hr for the last 6 hours? Yes.   Patient's UOP is 2.6 ml/kg/hr 3. Is BUN < 60 mg/dL? Yes.    Patient's BUN today is 20 4. Abnormal electrolyte(s): K+  5. Ordered repletion with:Protocol Peripheral 6. If a panic level lab has been reported, has the CCM MD in charge been notified? Yes.  .   Physician:  Randie Heinz 04/23/2019 6:34 AM

## 2019-04-23 NOTE — Progress Notes (Signed)
STROKE TEAM PROGRESS NOTE   INTERVAL HISTORY Her daughter and RN are at the bedside.  Patient sitting in chair for dinner.  She is awake alert, interactive, daughter as interpreter.  Neurologically stable.  No fever, responding well to antibiotics.  Given her septic picture and stroke, will recommend TEE to rule out endocarditis.  Vitals:   04/23/19 1000 04/23/19 1030 04/23/19 1100 04/23/19 1105  BP: (!) 169/72 (!) 171/58 (!) 172/72   Pulse: 86 82 82   Resp: (!) 21 19 19    Temp:    (!) 97.3 F (36.3 C)  TempSrc:    Axillary  SpO2: 99% 100% 99%   Weight:      Height:        CBC:  Recent Labs  Lab 04/20/19 2152 04/20/19 2152 04/21/19 1115 04/23/19 0339  WBC 20.1*   < > 20.6* 14.3*  NEUTROABS 15.7*  --   --   --   HGB 13.1   < > 13.9 13.1  HCT 38.0   < > 40.3 37.9  MCV 88.4   < > 88.4 87.5  PLT 324   < > 298 204   < > = values in this interval not displayed.    Basic Metabolic Panel:  Recent Labs  Lab 04/22/19 0254 04/23/19 0339  NA 145 142  K 3.7 3.0*  CL 107 106  CO2 25 23  GLUCOSE 104* 112*  BUN 35* 20  CREATININE 1.37* 1.11*  CALCIUM 8.3* 8.4*   Lipid Panel:     Component Value Date/Time   CHOL 273 (H) 04/21/2019 1115   TRIG 359 (H) 04/21/2019 1115   HDL 43 04/21/2019 1115   CHOLHDL 6.3 04/21/2019 1115   VLDL 72 (H) 04/21/2019 1115   LDLCALC 158 (H) 04/21/2019 1115   HgbA1c:  Lab Results  Component Value Date   HGBA1C 9.7 (H) 04/21/2019   Urine Drug Screen: No results found for: LABOPIA, COCAINSCRNUR, LABBENZ, AMPHETMU, THCU, LABBARB  Alcohol Level     Component Value Date/Time   ETH <5 04/25/2016 0957    IMAGING past 24 hours Overnight EEG with video  Result Date: 04/23/2019 Lora Havens, MD     04/23/2019  8:51 AM Patient Name: Nichole Munoz MRN: [id number] Epilepsy Attending: Lora Havens Referring Physician/Provider: Dr. Antony Contras Duration: 04/22/2019 0800 to 04/23/2019 0800  Patient history: 74 year old female presented with altered  mental status, left gaze deviation and aphasia.  EEG evaluate for seizures.  Level of alertness: awake, asleep  AEDs during EEG study: None  Technical aspects: This EEG study was done with scalp electrodes positioned according to the 10-20 International system of electrode placement. Electrical activity was acquired at a sampling rate of 500Hz  and reviewed with a high frequency filter of 70Hz  and a low frequency filter of 1Hz . EEG data were recorded continuously and digitally stored. DESCRIPTION: The posterior dominant rhythm consists of 8.5 Hz activity of moderate voltage (25-35 uV) seen predominantly in posterior head regions, asymmetric ( left <right) and reactive to eye opening and eye closing. Sleep was characterized by vertex waves, sleep spindles (12-14Hz ), maximal frontocentral.  EEG showed intermittent 3 to 6 Hz theta-delta slowing in left posterior temporal region. Hyperventilation and photic stimulation were not performed. Of note, after around 2300 on 04/22/2019, significant electrode artifact was noted and therefore EEG was difficult to interpret.  Abnormality - Background asymmetry, left<right - Intermittent slow, left posterior temporal region  IMPRESSION: This technically difficult study is suggestive of cortical dysfunction  in left posterior temporal region, nonspecific etiology but most likely secondary to underlying stroke. No seizures or epileptiform discharges were seen throughout the recording.  Priyanka Barbra Sarks    PHYSICAL EXAM  Temp:  [97.3 F (36.3 C)-98.5 F (36.9 C)] 98.5 F (36.9 C) (04/17 1932) Pulse Rate:  [70-88] 84 (04/17 1600) Resp:  [13-29] 16 (04/17 1600) BP: (93-192)/(45-100) 167/72 (04/17 1801) SpO2:  [98 %-100 %] 100 % (04/17 1600)  General - Well nourished, well developed, in no apparent distress.  Ophthalmologic - fundi not visualized due to noncooperation.  Cardiovascular - Regular rhythm and rate.  Mental Status -  Level of arousal and orientation  to time, place, and person were intact. Language including expression, naming, repetition, comprehension was assessed and found intact.  Cranial Nerves II - XII - II - Visual field intact OU. III, IV, VI - Extraocular movements intact. V - Facial sensation intact bilaterally. VII - Facial movement intact bilaterally. VIII - Hearing & vestibular intact bilaterally. X - Palate elevates symmetrically. XI - Chin turning & shoulder shrug intact bilaterally. XII - Tongue protrusion intact.  Motor Strength - The patient's strength was normal in all extremities and pronator drift was absent.  Bulk was normal and fasciculations were absent.   Motor Tone - Muscle tone was assessed at the neck and appendages and was normal.  Reflexes - The patient's reflexes were symmetrical in all extremities and she had no pathological reflexes.  Sensory - Light touch, temperature/pinprick were assessed and were symmetrical.    Coordination - The patient had normal movements in the hands with no ataxia or dysmetria.  Tremor was absent.  Gait and Station - deferred.   ASSESSMENT / PLAN Ms. Tiann Saha is a 74 y.o. female presenting with acute onset of decreased responsiveness, left gaze deviation and aphasia. She was treated with IV tPA.  Likely seizure and postictal state in the setting of urosepsis.  Overnight EEG 04/23/19 - technically difficultstudy suggestive of cortical dysfunction in left posterior temporal region, nonspecific etiology but most likely secondary to underlying stroke. No seizures orepileptiform discharges were seen throughout the recording.  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 ASPECTS 10.   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   No acute abnormality  2D Echo EF 65 to 70%, no clear cardiac source of embolism.  LE venous Doppler pending  Will do TEE to rule out endocarditis  If no cardioembolic source, may consider loop recorder  LDL 158  HgbA1c 10.5  Lovenox for VTE prophylaxis  None prior to admission, now on aspirin 81.  Continue on discharge  Therapy recommendations:  HH PT and OT  Disposition:  Pending  urosepsis  T-max 102.5->102.9->101.1->afebrile  UA WBC more than 50  Urine culture citrobacter  Blood culture pending  CSF WBC 6, protein 66  Zosyn and vancomycin -> Rocephin  Leukocytosis - WBCs - 20.1->20.6->14.3  Recommend TEE to rule out endocarditis  Hypertension  Home meds:  Zestiril  Stable . Long-term BP goal normotensive  Hyperlipidemia  Home meds:none  LDL 158, goal < 70  On lipitor 40  Continue statin at discharge  Diabetes type II Uncontrolled  Home meds:  Glucotrol, novolog, lantus, glucophage, tradjenta  HgbA1c 10.5, goal < 7.0  CBGs  SSI  On lantus  Close PCP follow up for better DM control  Other Stroke Risk Factors  Advanced age  Other Active Problems  Language barrier- pt is non-English speaking. We are not able to get a [place] translator at this time. Family has left bedside at time of our exam.  Hypokalemia - 3.0 - supplement  Acute on CKD - creatinine 1.46 ->1.37->1.11   Hospital day # 3  Rosalin Hawking, MD PhD Stroke Neurology 04/23/2019 8:27 PM   To contact Stroke Continuity provider, please refer to [email or web address] After hours, contact General Neurology

## 2019-04-23 NOTE — Progress Notes (Signed)
PROGRESS NOTE  Nichole Munoz  WJX:[id number] DOB: 12/13/1945 DOA: 04/20/2019 PCP: [facility], [facility]   Brief Narrative:  Nichole Munoz is a 74 y.o. female with a history of IDT2DM, asthma, HTN who presented to the ED unresponsive with leftward gaze deviation and aphasia triaged as code stroke and given tPA after negative head CT. She was also severely hyperglycemic and febrile with leukocytosis. Pyuria was noted, CXR revealing LLL consolidation which was not new. IV insulin was given and she was admitted to the ICU. Broad antibiotics and IVF boluses given with subsequent improvement lactic acid and stabilizing vital signs. Due to persistent encephalopathy, lumbar puncture yielded clear fluid with protein 66, 6 WBC, 1 RBC, no organisms on gram stain. Continuous EEG has been performed and neurology continues to follow the patient.   Assessment & Plan: Sepsis due to Citrobacter UTI: PCT, lactic acid, and WBC trending downward. CXR without new infiltrate, no respiratory symptoms, and lumbar puncture studies not suggestive of bacterial meningitis.  - Can switch to ceftriaxone per susceptibility data from urine culture - Continue monitoring blood cultures (NGTD)  Acute encephalopathy, acute CVA: Due to stroke vs. postictal vs. sepsis. Improved significantly. 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 Left posterior temporal dysfunction suggested on EEG LTM. CTA negative for acute stroke, LVO. Received tPA at admission. Echo without CES, LVEF preserved. - Treat conditions as above.  - Aspirin, statin  IDT2DM with hyperglycemia: Chronically poorly-controlled with HbA1c 9.7%.  - Glucose control improved from admission, postprandial elevation noted, continue lantus 20u BID + mod SSI + HS coverage. Will add 2u novolog coverage at mealtime.  Acute urinary retention:  - Inserted foley, can perform voiding trial soon  AKI on stage III CKD: Improved  with IVF. - Avoid nephrotoxins  HTN: No longer on cleviprex, BPs coming back up - Restarted lisinopril this AM - Add norvasc 5mg   Hypokalemia:  - Supplement, monitor.  GERD:  - PPI  DVT Prophylaxis: Lovenox 30mg  w24h Code status: Full Family Communication: None at bedside this AM.  Antimicrobials: Vancomycin, zosyn  Procedures:  Lumbar puncture 4/16 EEG 4/16:  Technical aspects: This EEG study was done with scalp electrodes positioned according to the 10-20 International system of electrode placement. Electrical activity was acquired at a sampling rate of 500Hz  and reviewed with a high frequency filter of 70Hz  and a low frequency filter of 1Hz . EEG data were recorded continuously and digitally stored.  DESCRIPTION:The posterior dominant rhythm consists of 8.5 Hz activity of moderate voltage (25-35 uV) seen predominantly in posterior head regions, asymmetric ( left <right) and reactive to eye opening and eye closing. Sleep was characterized by vertex waves, sleep spindles (12-14Hz ), maximal frontocentral.  EEG showed intermittent 3 to 6 Hz theta-delta slowing in left posterior temporal region. Hyperventilation and photic stimulation were not performed.  Of note, after around 2300 on 04/22/2019, significant electrode artifact was noted and therefore EEG was difficult to interpret.   Abnormality - Background asymmetry, left<right - Intermittent slow, left posterior temporal region  IMPRESSION: Thistechnically difficultstudy issuggestive of cortical dysfunction in left posterior temporal region, nonspecific etiology but most likely secondary to underlying stroke. No seizures orepileptiform discharges were seen throughout the recording.  Subjective: Much more alert and responsive, no overnight events noted. Having trouble keeping electrodes on. Son reported she's near her mental baseline.   Objective: Vitals:   04/23/19 1130 04/23/19 1250 04/23/19 1300 04/23/19 1510  BP:  (!) 171/65 (!) 155/71 Marland Kitchen)  147/74   Pulse: 77 88 87   Resp: 18 20 (!) 29   Temp:    98.2 F (36.8 C)  TempSrc:    Oral  SpO2: 100% 100% 100%   Weight:      Height:        Intake/Output Summary (Last 24 hours) at 04/23/2019 1605 Last data filed at 04/23/2019 1400 Gross per 24 hour  Intake 945.5 ml  Output 2614 ml  Net -1668.5 ml   Filed Weights   04/20/19 2100  Weight: 56.5 kg   Gen: 74 y.o. female in no distress Pulm: Nonlabored breathing room air. Clear. CV: Regular rate and rhythm. No murmur, rub, or gallop. No JVD, no dependent edema. GI: Abdomen soft, non-tender, non-distended, with normoactive bowel sounds.  Ext: Warm, no deformities Skin: No new rashes, lesions or ulcers on visualized skin. Neuro: Alert and interactive, no focal motor or sensory deficits on exam. Psych: Judgement and insight appear fair. Mood euthymic & affect congruent. Behavior is appropriate.    Data Reviewed: I have personally reviewed following labs and imaging studies  CBC: Recent Labs  Lab 04/20/19 2148 04/20/19 2152 04/21/19 1115 04/23/19 0339  WBC  --  20.1* 20.6* 14.3*  NEUTROABS  --  15.7*  --   --   HGB 12.9 13.1 13.9 13.1  HCT 38.0 38.0 40.3 37.9  MCV  --  88.4 88.4 87.5  PLT  --  324 298 235   Basic Metabolic Panel: Recent Labs  Lab 04/21/19 0838 04/21/19 1115 04/21/19 1902 04/22/19 0254 04/23/19 0339  NA 140 142 144 145 142  K 3.8 4.0 3.6 3.7 3.0*  CL 98 99 105 107 106  CO2 21* 21* 23 25 23   GLUCOSE 279* 338* 80 104* 112*  BUN 36* 39* 37* 35* 20  CREATININE 1.54* 1.67* 1.46* 1.37* 1.11*  CALCIUM 8.5* 9.0 8.6* 8.3* 8.4*   GFR: Estimated Creatinine Clearance: 36.8 mL/min (A) (by C-G formula based on SCr of 1.11 mg/dL (H)). Liver Function Tests: Recent Labs  Lab 04/20/19 2152  AST 18  ALT 13  ALKPHOS 79  BILITOT 0.4  PROT 8.3*  ALBUMIN 4.0   Coagulation Profile: Recent Labs  Lab 04/20/19 2152 04/21/19 1309  INR 0.9 1.0   HbA1C: Recent Labs     04/21/19 1115  HGBA1C 9.7*   CBG: Recent Labs  Lab 04/23/19 0342 04/23/19 0705 04/23/19 1104 04/23/19 1215 04/23/19 1510  GLUCAP 119* 127* 183* 193* 208*   Lipid Profile: Recent Labs    04/21/19 1115  CHOL 273*  HDL 43  LDLCALC 158*  TRIG 359*  CHOLHDL 6.3   Urine analysis:    Component Value Date/Time   COLORURINE YELLOW 04/21/2019 1844   APPEARANCEUR HAZY (A) 04/21/2019 1844   LABSPEC 1.023 04/21/2019 1844   PHURINE 5.0 04/21/2019 1844   GLUCOSEU 150 (A) 04/21/2019 1844   HGBUR MODERATE (A) 04/21/2019 1844   BILIRUBINUR NEGATIVE 04/21/2019 1844   KETONESUR NEGATIVE 04/21/2019 1844   PROTEINUR >=300 (A) 04/21/2019 1844   UROBILINOGEN 1.0 09/23/2014 1458   NITRITE POSITIVE (A) 04/21/2019 1844   LEUKOCYTESUR MODERATE (A) 04/21/2019 1844   Recent Results (from the past 240 hour(s))  Respiratory Panel by RT PCR (Flu A&B, Covid) - Nasopharyngeal Swab     Status: None   Collection Time: 04/21/19 12:20 AM   Specimen: Nasopharyngeal Swab  Result Value Ref Range Status   SARS Coronavirus 2 by RT PCR NEGATIVE NEGATIVE Final    Comment: (NOTE) SARS-CoV-2  target nucleic acids are NOT DETECTED. The SARS-CoV-2 RNA is generally detectable in upper respiratoy specimens during the acute phase of infection. The lowest concentration of SARS-CoV-2 viral copies this assay can detect is 131 copies/mL. A negative result does not preclude SARS-Cov-2 infection and should not be used as the sole basis for treatment or other patient management decisions. A negative result may occur with  improper specimen collection/handling, submission of specimen other than nasopharyngeal swab, presence of viral mutation(s) within the areas targeted by this assay, and inadequate number of viral copies (<131 copies/mL). A negative result must be combined with clinical observations, patient history, and epidemiological information. The expected result is Negative. Fact Sheet for Patients:    PinkCheek.be Fact Sheet for Healthcare Providers:  GravelBags.it This test is not yet ap proved or cleared by the [place] FDA and  has been authorized for detection and/or diagnosis of SARS-CoV-2 by FDA under an Emergency Use Authorization (EUA). This EUA will remain  in effect (meaning this test can be used) for the duration of the COVID-19 declaration under Section 564(b)(1) of the Act, 21 U.S.C. section 360bbb-3(b)(1), unless the authorization is terminated or revoked sooner.    Influenza A by PCR NEGATIVE NEGATIVE Final   Influenza B by PCR NEGATIVE NEGATIVE Final    Comment: (NOTE) The Xpert Xpress SARS-CoV-2/FLU/RSV assay is intended as an aid in  the diagnosis of influenza from Nasopharyngeal swab specimens and  should not be used as a sole basis for treatment. Nasal washings and  aspirates are unacceptable for Xpert Xpress SARS-CoV-2/FLU/RSV  testing. Fact Sheet for Patients: PinkCheek.be Fact Sheet for Healthcare Providers: GravelBags.it This test is not yet approved or cleared by the [place] FDA and  has been authorized for detection and/or diagnosis of SARS-CoV-2 by  FDA under an Emergency Use Authorization (EUA). This EUA will remain  in effect (meaning this test can be used) for the duration of the  Covid-19 declaration under Section 564(b)(1) of the Act, 21  U.S.C. section 360bbb-3(b)(1), unless the authorization is  terminated or revoked. Performed at [facility], [place] [address], [place], [place] [address]   Urine culture     Status: Abnormal   Collection Time: 04/21/19 10:40 AM   Specimen: Urine, Random  Result Value Ref Range Status   Specimen Description URINE, RANDOM  Final   Special Requests   Final    NONE Performed at [facility], [place] [address], [place], [place] [address]    Culture >=100,000 COLONIES/mL  CITROBACTER FREUNDII (A)  Final   Report Status 04/23/2019 FINAL  Final   Organism ID, Bacteria CITROBACTER FREUNDII (A)  Final      Susceptibility   Citrobacter freundii - MIC*    CEFAZOLIN >=64 RESISTANT Resistant     CEFTRIAXONE <=0.25 SENSITIVE Sensitive     CIPROFLOXACIN <=0.25 SENSITIVE Sensitive     GENTAMICIN <=1 SENSITIVE Sensitive     IMIPENEM <=0.25 SENSITIVE Sensitive     NITROFURANTOIN <=16 SENSITIVE Sensitive     TRIMETH/SULFA <=20 SENSITIVE Sensitive     PIP/TAZO <=4 SENSITIVE Sensitive     * >=100,000 COLONIES/mL CITROBACTER FREUNDII  Culture, blood (routine x 2)     Status: None (Preliminary result)   Collection Time: 04/21/19 11:10 AM   Specimen: BLOOD RIGHT HAND  Result Value Ref Range Status   Specimen Description BLOOD RIGHT HAND  Final   Special Requests   Final    BOTTLES DRAWN AEROBIC AND ANAEROBIC Blood Culture  adequate volume   Culture   Final    NO GROWTH 2 DAYS Performed at [facility], [place] [address], [place], [place] [address]    Report Status PENDING  Incomplete  Culture, blood (routine x 2)     Status: None (Preliminary result)   Collection Time: 04/21/19 11:15 AM   Specimen: BLOOD LEFT HAND  Result Value Ref Range Status   Specimen Description BLOOD LEFT HAND  Final   Special Requests AEROBIC BOTTLE ONLY Blood Culture adequate volume  Final   Culture   Final    NO GROWTH 2 DAYS Performed at [facility], [place] [address], [place], [place] [address]    Report Status PENDING  Incomplete  CSF culture     Status: None (Preliminary result)   Collection Time: 04/22/19 11:11 AM   Specimen: PATH Cytology CSF; Cerebrospinal Fluid  Result Value Ref Range Status   Specimen Description CSF  Final   Special Requests NONE  Final   Gram Stain   Final    WBC PRESENT,BOTH PMN AND MONONUCLEAR NO ORGANISMS SEEN CYTOSPIN SMEAR    Culture   Final    NO GROWTH < 24 HOURS Performed at [facility], [place] [address], [place], [place]  [address]    Report Status PENDING  Incomplete      Radiology Studies: MR BRAIN WO CONTRAST  Result Date: 04/21/2019 CLINICAL DATA:  Stroke follow-up. Status post tPA. EXAM: MRI HEAD WITHOUT CONTRAST TECHNIQUE: Multiplanar, multiecho pulse sequences of the brain and surrounding structures were obtained without intravenous contrast. COMPARISON:  Brain MRI 06/29/2014 CTA head 04/20/2018 FINDINGS: Brain: Small focus hyperintensity on diffusion-weighted imaging within medial left temporal lobe without definite correlate ADC map. No other diffusion abnormality. There is an old left occipital lobe infarct. There is multifocal periventricular white matter hyperintensity, most often a result of chronic microvascular ischemia. There is generalized atrophy without lobar predilection. No chronic microhemorrhage. Incidentally noted cavum septum pellucidum et vergae Vascular: Normal flow voids. Skull and upper cervical spine: Normal marrow signal. Sinuses/Orbits: Bilateral mastoid fluid. Paranasal sinuses are clear. Normal orbits. Other: None IMPRESSION: 1. Small subacute infarct of the medial left temporal lobe. 2. No hemorrhage or mass effect. 3. 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 Electronically Signed   By: Ulyses Jarred M.D.   On: 04/21/2019 22:24   Overnight EEG with video  Result Date: 04/23/2019 Lora Havens, MD     04/23/2019  8:51 AM Patient Name: Nichole Munoz MRN: [id number] Epilepsy Attending: Lora Havens Referring Physician/Provider: Dr. Antony Contras Duration: 04/22/2019 0800 to 04/23/2019 0800  Patient history: 74 year old female presented with altered mental status, left gaze deviation and aphasia.  EEG evaluate for seizures.  Level of alertness: awake, asleep  AEDs during EEG study: None  Technical aspects: This EEG study was done with scalp electrodes positioned according to the 10-20 International system of electrode placement. Electrical activity was  acquired at a sampling rate of 500Hz  and reviewed with a high frequency filter of 70Hz  and a low frequency filter of 1Hz . EEG data were recorded continuously and digitally stored. DESCRIPTION: The posterior dominant rhythm consists of 8.5 Hz activity of moderate voltage (25-35 uV) seen predominantly in posterior head regions, asymmetric ( left <right) and reactive to eye opening and eye closing. Sleep was characterized by vertex waves, sleep spindles (12-14Hz ), maximal frontocentral.  EEG showed intermittent 3 to 6 Hz theta-delta slowing in left posterior temporal region.  Hyperventilation and photic stimulation were not performed. Of note, after around 2300 on 04/22/2019, significant electrode artifact was noted and therefore EEG was difficult to interpret.  Abnormality - Background asymmetry, left<right - Intermittent slow, left posterior temporal region  IMPRESSION: This technically difficult study is suggestive of cortical dysfunction in left posterior temporal region, nonspecific etiology but most likely secondary to underlying stroke. No seizures or epileptiform discharges were seen throughout the recording.  Priyanka Barbra Sarks   DG FL GUIDED LUMBAR PUNCTURE  Result Date: 04/22/2019 CLINICAL DATA:  Fever and leukocytosis. Clinical concern for meningitis. EXAM: DIAGNOSTIC LUMBAR PUNCTURE UNDER FLUOROSCOPIC GUIDANCE FLUOROSCOPY TIME:  Fluoroscopy Time: 6 seconds of low-dose pulsed fluoroscopy Radiation Exposure Index (if provided by the fluoroscopic device): 0.6 mGy Number of Acquired Spot Images: 0 PROCEDURE: Standard time-out was employed. I discussed the risks (including hemorrhage, infection, headache, and nerve damage, among others), benefits, and alternatives to fluoroscopically guided lumbar puncture with the patient's daughter by telephone prior to the procedure. We specifically discussed the high technical likelihood of success of the procedure. The daughter understood and elected to undergo the  procedure. An appropriate site for lumbar puncture was marked on the patient's skin under fluoroscopic guidance. Following sterile skin prep and local anesthetic administration consisting of 1 percent lidocaine, a 22 gauge spinal needle was advanced without difficulty into the thecal sac at the at the L2-3 level under fluoroscopic guidance. Clear CSF was returned. Opening pressure was 13 cm measured prone through the needle. 12 cc of clear CSF was collected. The needle was subsequently removed and the skin cleansed and bandaged. No immediate complications were observed. IMPRESSION: Diagnostic lumbar puncture without immediate complications. Electronically Signed   By: Richardean Sale M.D.   On: 04/22/2019 11:30    Scheduled Meds: .  stroke: mapping our early stages of recovery book   Does not apply Once  . aspirin EC  81 mg Oral Daily  . atorvastatin  40 mg Oral Daily  . Chlorhexidine Gluconate Cloth  6 each Topical Daily  . enoxaparin (LOVENOX) injection  30 mg Subcutaneous Q24H  . insulin aspart  0-15 Units Subcutaneous TID WC  . insulin aspart  0-5 Units Subcutaneous QHS  . insulin glargine  20 Units Subcutaneous BID  . lisinopril  10 mg Oral Daily  . pantoprazole (PROTONIX) IV  40 mg Intravenous QHS   Continuous Infusions: . sodium chloride Stopped (04/23/19 0323)  . sodium chloride 10 mL/hr at 04/23/19 1400  . piperacillin-tazobactam (ZOSYN)  IV 3.375 g (04/23/19 1540)     LOS: 3 days   Time spent: 35 minutes.  Patrecia Pour, MD [facility] [email or web address] 04/23/2019, 4:05 PM

## 2019-04-23 NOTE — Procedures (Addendum)
Patient Name: Nichole Munoz  MRN: [id number]  Epilepsy Attending: Lora Havens  Referring Physician/Provider: Dr. Antony Contras Duration: 04/22/2019 0800 to 04/23/2019 0935  Patient history: 74 year old female presented with altered mental status, left gaze deviation and aphasia.  EEG evaluate for seizures.  Level of alertness: awake, asleep  AEDs during EEG study: None  Technical aspects: This EEG study was done with scalp electrodes positioned according to the 10-20 International system of electrode placement. Electrical activity was acquired at a sampling rate of 500Hz  and reviewed with a high frequency filter of 70Hz  and a low frequency filter of 1Hz . EEG data were recorded continuously and digitally stored.   DESCRIPTION: The posterior dominant rhythm consists of 8.5 Hz activity of moderate voltage (25-35 uV) seen predominantly in posterior head regions, asymmetric ( left <right) and reactive to eye opening and eye closing. Sleep was characterized by vertex waves, sleep spindles (12-14Hz ), maximal frontocentral.  EEG showed intermittent 3 to 6 Hz theta-delta slowing in left posterior temporal region. Hyperventilation and photic stimulation were not performed.  Of note, after around 2300 on 04/22/2019, significant electrode artifact was noted and therefore EEG was difficult to interpret.   Abnormality - Background asymmetry, left<right - Intermittent slow, left posterior temporal region  IMPRESSION: This technically difficult study is suggestive of cortical dysfunction in left posterior temporal region, nonspecific etiology but most likely secondary to underlying stroke. No seizures or epileptiform discharges were seen throughout the recording.  Melinna Linarez Barbra Sarks

## 2019-04-23 NOTE — Progress Notes (Signed)
LTM EEG disconnected - no skin breakdown at unhook.  

## 2019-04-23 NOTE — Progress Notes (Signed)
Patient not compliant with EEG treatment. She continues to pick at leads attached to her head. RN attempts to stop her, but patient still not compliant. Dr. Cheral Marker made aware.

## 2019-04-23 NOTE — Progress Notes (Signed)
eLink Physician-Brief Progress Note Patient Name: Nichole Munoz DOB: 1945/09/27 MRN: [id number]   Date of Service  04/23/2019  HPI/Events of Note  Hypertension - BP = 184/77. Will restart home Lisinopril.   eICU Interventions  Will order: 1. Lisinopril 10 mg PO now and Q day.     Intervention Category Major Interventions: Hypertension - evaluation and management Intermediate Interventions: Hypertension - evaluation and management  Matias Thurman [place] 04/23/2019, 12:59 AM

## 2019-04-24 ENCOUNTER — Inpatient Hospital Stay (HOSPITAL_COMMUNITY): Payer: Medicare Other

## 2019-04-24 DIAGNOSIS — E1165 Type 2 diabetes mellitus with hyperglycemia: Secondary | ICD-10-CM

## 2019-04-24 DIAGNOSIS — E7800 Pure hypercholesterolemia, unspecified: Secondary | ICD-10-CM

## 2019-04-24 DIAGNOSIS — R569 Unspecified convulsions: Secondary | ICD-10-CM

## 2019-04-24 DIAGNOSIS — I639 Cerebral infarction, unspecified: Secondary | ICD-10-CM | POA: Diagnosis not present

## 2019-04-24 DIAGNOSIS — A4151 Sepsis due to Escherichia coli [E. coli]: Secondary | ICD-10-CM

## 2019-04-24 LAB — GLUCOSE, CAPILLARY
Glucose-Capillary: 84 mg/dL (ref 70–99)
Glucose-Capillary: 218 mg/dL — ABNORMAL HIGH (ref 70–99)
Glucose-Capillary: 173 mg/dL — ABNORMAL HIGH (ref 70–99)
Glucose-Capillary: 153 mg/dL — ABNORMAL HIGH (ref 70–99)

## 2019-04-24 LAB — CBC
HCT: 35.6 % — ABNORMAL LOW (ref 36.0–46.0)
Hemoglobin: 12.3 g/dL (ref 12.0–15.0)
MCH: 30.1 pg (ref 26.0–34.0)
MCHC: 34.6 g/dL (ref 30.0–36.0)
MCV: 87.3 fL (ref 80.0–100.0)
Platelets: 203 10*3/uL (ref 150–400)
RBC: 4.08 MIL/uL (ref 3.87–5.11)
RDW: 11.3 % — ABNORMAL LOW (ref 11.5–15.5)
WBC: 9.5 10*3/uL (ref 4.0–10.5)
nRBC: 0 % (ref 0.0–0.2)

## 2019-04-24 LAB — BASIC METABOLIC PANEL
Anion gap: 10 (ref 5–15)
BUN: 21 mg/dL (ref 8–23)
CO2: 23 mmol/L (ref 22–32)
Calcium: 8.3 mg/dL — ABNORMAL LOW (ref 8.9–10.3)
Chloride: 108 mmol/L (ref 98–111)
Creatinine, Ser: 1.17 mg/dL — ABNORMAL HIGH (ref 0.44–1.00)
GFR calc Af Amer: 53 mL/min — ABNORMAL LOW (ref >60)
GFR calc non Af Amer: 46 mL/min — ABNORMAL LOW (ref >60)
Glucose, Bld: 125 mg/dL — ABNORMAL HIGH (ref 70–99)
Potassium: 3 mmol/L — ABNORMAL LOW (ref 3.5–5.1)
Sodium: 141 mmol/L (ref 135–145)

## 2019-04-24 LAB — PROCALCITONIN: Procalcitonin: 6.3 ng/mL

## 2019-04-24 MED ORDER — ALBUTEROL SULFATE HFA 108 (90 BASE) MCG/ACT IN AERS: 2.0000 | INHALATION_SPRAY | RESPIRATORY_TRACT | Status: DC | PRN

## 2019-04-24 MED ORDER — ALBUTEROL SULFATE (2.5 MG/3ML) 0.083% IN NEBU
2.5000 mg | INHALATION_SOLUTION | RESPIRATORY_TRACT | Status: DC | PRN
  Administered 2019-04-24: 15:00:00 2.5 mg via RESPIRATORY_TRACT
  Filled 2019-04-24: qty 3

## 2019-04-24 MED ORDER — GABAPENTIN 300 MG PO CAPS
300.0000 mg | ORAL_CAPSULE | Freq: Three times a day (TID) | ORAL | Status: DC
  Administered 2019-04-24 – 2019-04-26 (×6): 300 mg via ORAL
  Filled 2019-04-24 (×7): qty 1

## 2019-04-24 MED ORDER — POTASSIUM CHLORIDE 20 MEQ/15ML (10%) PO SOLN
40.0000 meq | Freq: Two times a day (BID) | ORAL | Status: AC
  Administered 2019-04-24 (×2): 40 meq via ORAL
  Filled 2019-04-24 (×2): qty 30

## 2019-04-24 MED ORDER — TRAMADOL HCL 50 MG PO TABS
50.0000 mg | ORAL_TABLET | Freq: Two times a day (BID) | ORAL | Status: DC | PRN
  Administered 2019-04-25: 50 mg via ORAL
  Filled 2019-04-24: qty 1

## 2019-04-24 MED ORDER — HYDRALAZINE HCL 25 MG PO TABS: 25.0000 mg | ORAL_TABLET | Freq: Three times a day (TID) | ORAL | Status: DC | PRN
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
## 2019-04-24 NOTE — Progress Notes (Signed)
STROKE TEAM PROGRESS NOTE   INTERVAL HISTORY Her RN is at the bedside.  Patient sitting in bed, awake alert, pleasant, no acute distress, no neuro changes. She stated that she has b/l leg pain from knee to the foot, intermittent. Otherwise, no complains. Pending TEE tomorrow. Ate lunch well.   Vitals:   04/24/19 0400 04/24/19 0500 04/24/19 0600 04/24/19 0710  BP: (!) 156/79 (!) 109/93 (!) 148/81   Pulse: 87 88 80   Resp: 19 18 15    Temp:    99.1 F (37.3 C)  TempSrc:    Oral  SpO2: 99% 100% 99%   Weight:      Height:        CBC:  Recent Labs  Lab 04/20/19 2152 04/21/19 1115 04/23/19 0339 04/24/19 0353  WBC 20.1*   < > 14.3* 9.5  NEUTROABS 15.7*  --   --   --   HGB 13.1   < > 13.1 12.3  HCT 38.0   < > 37.9 35.6*  MCV 88.4   < > 87.5 87.3  PLT 324   < > 204 203   < > = values in this interval not displayed.    Basic Metabolic Panel:  Recent Labs  Lab 04/23/19 2002 04/24/19 0353  NA 142 141  K 3.5 3.0*  CL 106 108  CO2 24 23  GLUCOSE 163* 125*  BUN 22 21  CREATININE 1.24* 1.17*  CALCIUM 8.2* 8.3*   Lipid Panel:     Component Value Date/Time   CHOL 273 (H) 04/21/2019 1115   TRIG 359 (H) 04/21/2019 1115   HDL 43 04/21/2019 1115   CHOLHDL 6.3 04/21/2019 1115   VLDL 72 (H) 04/21/2019 1115   LDLCALC 158 (H) 04/21/2019 1115   HgbA1c:  Lab Results  Component Value Date   HGBA1C 9.7 (H) 04/21/2019   Urine Drug Screen: No results found for: LABOPIA, COCAINSCRNUR, LABBENZ, AMPHETMU, THCU, LABBARB  Alcohol Level     Component Value Date/Time   ETH <5 04/25/2016 0957    IMAGING past 24 hours No results found.  PHYSICAL EXAM  Temp:  [97.3 F (36.3 C)-99.1 F (37.3 C)] 99.1 F (37.3 C) (04/18 0710) Pulse Rate:  [71-88] 80 (04/18 0600) Resp:  [15-29] 15 (04/18 0600) BP: (109-172)/(58-93) 148/81 (04/18 0600) SpO2:  [98 %-100 %] 99 % (04/18 0600)  General - Well nourished, well developed, in no apparent distress.  Ophthalmologic - fundi not visualized  due to noncooperation.  Cardiovascular - Regular rhythm and rate.  Mental Status -  Level of arousal and orientation to time, place, and person were intact. Language including expression, naming, repetition, comprehension was assessed and found intact.  Cranial Nerves II - XII - II - Visual field intact OU. III, IV, VI - Extraocular movements intact. V - Facial sensation intact bilaterally. VII - Facial movement intact bilaterally. VIII - Hearing & vestibular intact bilaterally. X - Palate elevates symmetrically. XI - Chin turning & shoulder shrug intact bilaterally. XII - Tongue protrusion intact.  Motor Strength - The patient's strength was normal in all extremities and pronator drift was absent.  Bulk was normal and fasciculations were absent.   Motor Tone - Muscle tone was assessed at the neck and appendages and was normal.  Reflexes - The patient's reflexes were symmetrical in all extremities and she had no pathological reflexes.  Sensory - Light touch, temperature/pinprick were assessed and were symmetrical.    Coordination - The patient had normal movements in the  hands with no ataxia or dysmetria.  Tremor was absent.  Gait and Station - deferred.   ASSESSMENT / PLAN [name] is a 74 y.o. female presenting with acute onset of decreased responsiveness, left gaze deviation and aphasia. She was treated with IV tPA.  Likely seizure and postictal state in the setting of urosepsis.  Overnight EEG 04/23/19 - technically difficultstudy suggestive of cortical dysfunction in left posterior temporal region, nonspecific etiology but most likely secondary to underlying stroke. No seizures orepileptiform discharges were seen throughout the recording.  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 ASPECTS 10.   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  No acute abnormality  2D Echo EF 65 to 70%, no clear cardiac source of embolism.  LE venous Doppler pending  TEE to rule out endocarditis - message sent to Cardiology - NPO after midnight - insulin will be decreased for NPO status  If no cardioembolic source, will recommend loop recorder to rule out afib  LDL 158  HgbA1c 10.5  Lovenox for VTE prophylaxis  None prior to admission, now on aspirin 81.  Continue on discharge  Therapy recommendations:  HH PT and OT  Disposition:  Pending  urosepsis  T-max 102.5->102.9->101.1->afebrile->afebrile  UA WBC more than 50  Urine culture citrobacter  Blood culture NGTD  CSF WBC 6, protein 66, culture neg  Zosyn and vancomycin -> Rocephin  Leukocytosis - WBCs - 20.1->20.6->14.3->9.5  Recommend TEE to rule out endocarditis  Hypertension  Home meds:  Zestiril  Stable . Long-term BP goal normotensive  Hyperlipidemia  Home meds:none  LDL 158, goal < 70  On lipitor 40  Continue statin at discharge  Diabetes type II Uncontrolled  Home meds:  Glucotrol, novolog, lantus, glucophage, tradjenta  HgbA1c 10.5, goal < 7.0  CBGs  SSI  On lantus  Close PCP follow up for better DM control  Other Stroke Risk Factors  Advanced age  Other Active Problems  Language barrier- pt is non-English speaking. We are not able to get a Nichole Munoz translator at this time. Family has left bedside at time of our exam.  Hypokalemia - 3.0->3.5->3.0 - supplement ordered - recheck in AM  Acute on CKD - creatinine 1.46 ->1.37->1.11->1.24->1.17   Hospital day # 4  Rosalin Hawking, MD PhD Stroke Neurology 04/24/2019 1:40 PM   To contact Stroke Continuity provider, please refer to [email or web address] After hours, contact General Neurology

## 2019-04-24 NOTE — Progress Notes (Signed)
PROGRESS NOTE  Nichole Munoz  ZOX:[id number] DOB: 1945-10-25 DOA: 04/20/2019 PCP: [facility]   Brief Narrative:  Nichole Munoz is a 74 y.o. female with a history of IDT2DM, asthma, HTN who presented to the ED unresponsive with leftward gaze deviation and aphasia triaged as code stroke and given tPA after negative head CT. She was also severely hyperglycemic and febrile with leukocytosis. Pyuria was noted, CXR revealing LLL consolidation which was not new. IV insulin was given and she was admitted to the ICU. Broad antibiotics and IVF boluses given with subsequent improvement lactic acid and stabilizing vital signs. Due to persistent encephalopathy, lumbar puncture yielded clear fluid with protein 66, 6 WBC, 1 RBC, no organisms on gram stain. MRI revealed subacute infarct and chronic microvascular disease. Continuous EEG has been performed and neurology continues to follow the patient, recommending TEE and loop recorder placement.  Assessment & Plan: Sepsis due to Citrobacter UTI: PCT, lactic acid, and WBC trending downward. CXR without new infiltrate, no respiratory symptoms, and lumbar puncture studies not suggestive of bacterial meningitis.  - Can switch to ceftriaxone per susceptibility data from urine culture - Continue monitoring blood cultures (NGTD)  Acute encephalopathy, acute CVA: Due to stroke vs. postictal vs. sepsis. Improved significantly. 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 Left posterior temporal dysfunction suggested on EEG LTM. CTA negative for acute stroke, LVO. Received tPA at admission. Echo without CES, LVEF preserved. - Treat conditions as above.  - Aspirin, statin - TEE, loop recorder requested by neurology.  IDT2DM with hyperglycemia: Chronically poorly-controlled with HbA1c 9.7%.  - Glucose control improved from admission, postprandial elevation noted, continue lantus 20u BID + mod SSI + HS coverage.  Will add 2u novolog coverage at mealtime.  Diabetic peripheral neuropathy:  - Leg pain starting, will restart gabapentin. LE U/S negative for DVT. - Tramadol low dose prn.  Acute urinary retention:  - Inserted foley, can perform voiding trial soon  AKI on stage III CKD: Improved with IVF. - Avoid nephrotoxins  HTN: No longer on cleviprex, BPs coming back up - Restarted lisinopril, added norvasc 5mg . Can give prn hydralazine.   Hypokalemia:  - Supplement, monitor.  GERD:  - PPI  DVT Prophylaxis: Lovenox 30mg  w24h Code status: Full Family Communication: None at bedside this AM.  Antimicrobials: Vancomycin, zosyn > ceftriaxone Procedures:  Lumbar puncture 4/16 EEG 4/16:  Technical aspects: This EEG study was done with scalp electrodes positioned according to the 10-20 International system of electrode placement. Electrical activity was acquired at a sampling rate of 500Hz  and reviewed with a high frequency filter of 70Hz  and a low frequency filter of 1Hz . EEG data were recorded continuously and digitally stored.  DESCRIPTION:The posterior dominant rhythm consists of 8.5 Hz activity of moderate voltage (25-35 uV) seen predominantly in posterior head regions, asymmetric ( left <right) and reactive to eye opening and eye closing. Sleep was characterized by vertex waves, sleep spindles (12-14Hz ), maximal frontocentral.  EEG showed intermittent 3 to 6 Hz theta-delta slowing in left posterior temporal region. Hyperventilation and photic stimulation were not performed.  Of note, after around 2300 on 04/22/2019, significant electrode artifact was noted and therefore EEG was difficult to interpret.   Abnormality - Background asymmetry, left<right - Intermittent slow, left posterior temporal region  IMPRESSION: Thistechnically difficultstudy issuggestive of cortical dysfunction in left posterior temporal region, nonspecific etiology but most likely secondary to underlying stroke. No  seizures orepileptiform discharges were seen throughout the  recording.  Subjective: Having bilateral lower leg pain. Has not been taking gabapentin since admission. No numbness or weakness.   Objective: Vitals:   04/24/19 1025 04/24/19 1115 04/24/19 1200 04/24/19 1300  BP: (!) 160/73  (!) 152/71 98/75  Pulse:   87 87  Resp:   (!) 21 (!) 21  Temp:  98.7 F (37.1 C)    TempSrc:  Oral    SpO2:   99% 100%  Weight:      Height:        Intake/Output Summary (Last 24 hours) at 04/24/2019 1439 Last data filed at 04/24/2019 1200 Gross per 24 hour  Intake 412.68 ml  Output 2925 ml  Net -2512.32 ml   Filed Weights   04/20/19 2100  Weight: 56.5 kg   Gen: 74 y.o. female in no distress Pulm: Nonlabored breathing room air. Clear. CV: Regular rate and rhythm. No murmur, rub, or gallop. No JVD, no dependent edema. GI: Abdomen soft, non-tender, non-distended, with normoactive bowel sounds.  Ext: Warm, no deformities Skin: No rashes, lesions or ulcers on visualized skin. Neuro: Alert and oriented. No focal neurological deficits. Psych: Judgement and insight appear fair. Mood euthymic & affect congruent. Behavior is appropriate.    Data Reviewed: I have personally reviewed following labs and imaging studies  CBC: Recent Labs  Lab 04/20/19 2148 04/20/19 2152 04/21/19 1115 04/23/19 0339 04/24/19 0353  WBC  --  20.1* 20.6* 14.3* 9.5  NEUTROABS  --  15.7*  --   --   --   HGB 12.9 13.1 13.9 13.1 12.3  HCT 38.0 38.0 40.3 37.9 35.6*  MCV  --  88.4 88.4 87.5 87.3  PLT  --  324 298 204 545   Basic Metabolic Panel: Recent Labs  Lab 04/21/19 1902 04/22/19 0254 04/23/19 0339 04/23/19 2002 04/24/19 0353  NA 144 145 142 142 141  K 3.6 3.7 3.0* 3.5 3.0*  CL 105 107 106 106 108  CO2 23 25 23 24 23   GLUCOSE 80 104* 112* 163* 125*  BUN 37* 35* 20 22 21   CREATININE 1.46* 1.37* 1.11* 1.24* 1.17*  CALCIUM 8.6* 8.3* 8.4* 8.2* 8.3*   GFR: Estimated Creatinine Clearance: 34.9 mL/min (A)  (by C-G formula based on SCr of 1.17 mg/dL (H)). Liver Function Tests: Recent Labs  Lab 04/20/19 2152  AST 18  ALT 13  ALKPHOS 79  BILITOT 0.4  PROT 8.3*  ALBUMIN 4.0   Coagulation Profile: Recent Labs  Lab 04/20/19 2152 04/21/19 1309  INR 0.9 1.0   HbA1C: No results for input(s): HGBA1C in the last 72 hours. CBG: Recent Labs  Lab 04/23/19 1510 04/23/19 1927 04/23/19 2236 04/24/19 0708 04/24/19 1130  GLUCAP 208* 153* 148* 84 218*   Lipid Profile: No results for input(s): CHOL, HDL, LDLCALC, TRIG, CHOLHDL, LDLDIRECT in the last 72 hours. Urine analysis:    Component Value Date/Time   COLORURINE YELLOW 04/21/2019 1844   APPEARANCEUR HAZY (A) 04/21/2019 1844   LABSPEC 1.023 04/21/2019 1844   PHURINE 5.0 04/21/2019 1844   GLUCOSEU 150 (A) 04/21/2019 1844   HGBUR MODERATE (A) 04/21/2019 1844   BILIRUBINUR NEGATIVE 04/21/2019 1844   KETONESUR NEGATIVE 04/21/2019 1844   PROTEINUR >=300 (A) 04/21/2019 1844   UROBILINOGEN 1.0 09/23/2014 1458   NITRITE POSITIVE (A) 04/21/2019 1844   LEUKOCYTESUR MODERATE (A) 04/21/2019 1844   Recent Results (from the past 240 hour(s))  Respiratory Panel by RT PCR (Flu A&B, Covid) - Nasopharyngeal Swab     Status: None  Collection Time: 04/21/19 12:20 AM   Specimen: Nasopharyngeal Swab  Result Value Ref Range Status   SARS Coronavirus 2 by RT PCR NEGATIVE NEGATIVE Final    Comment: (NOTE) SARS-CoV-2 target nucleic acids are NOT DETECTED. The SARS-CoV-2 RNA is generally detectable in upper respiratoy specimens during the acute phase of infection. The lowest concentration of SARS-CoV-2 viral copies this assay can detect is 131 copies/mL. A negative result does not preclude SARS-Cov-2 infection and should not be used as the sole basis for treatment or other patient management decisions. A negative result may occur with  improper specimen collection/handling, submission of specimen other than nasopharyngeal swab, presence of viral  mutation(s) within the areas targeted by this assay, and inadequate number of viral copies (<131 copies/mL). A negative result must be combined with clinical observations, patient history, and epidemiological information. The expected result is Negative. Fact Sheet for Patients:  PinkCheek.be Fact Sheet for Healthcare Providers:  GravelBags.it This test is not yet ap proved or cleared by the [place] FDA and  has been authorized for detection and/or diagnosis of SARS-CoV-2 by FDA under an Emergency Use Authorization (EUA). This EUA will remain  in effect (meaning this test can be used) for the duration of the COVID-19 declaration under Section 564(b)(1) of the Act, 21 U.S.C. section 360bbb-3(b)(1), unless the authorization is terminated or revoked sooner.    Influenza A by PCR NEGATIVE NEGATIVE Final   Influenza B by PCR NEGATIVE NEGATIVE Final    Comment: (NOTE) The Xpert Xpress SARS-CoV-2/FLU/RSV assay is intended as an aid in  the diagnosis of influenza from Nasopharyngeal swab specimens and  should not be used as a sole basis for treatment. Nasal washings and  aspirates are unacceptable for Xpert Xpress SARS-CoV-2/FLU/RSV  testing. Fact Sheet for Patients: PinkCheek.be Fact Sheet for Healthcare Providers: GravelBags.it This test is not yet approved or cleared by the [place] FDA and  has been authorized for detection and/or diagnosis of SARS-CoV-2 by  FDA under an Emergency Use Authorization (EUA). This EUA will remain  in effect (meaning this test can be used) for the duration of the  Covid-19 declaration under Section 564(b)(1) of the Act, 21  U.S.C. section 360bbb-3(b)(1), unless the authorization is  terminated or revoked. Performed at [facility], [place] [address] [place], [place] [address]   Urine culture     Status: Abnormal   Collection  Time: 04/21/19 10:40 AM   Specimen: Urine, Random  Result Value Ref Range Status   Specimen Description URINE, RANDOM  Final   Special Requests   Final    NONE Performed at [facility], [place] [address] [place], [place] [address]    Culture >=100,000 COLONIES/mL CITROBACTER FREUNDII (A)  Final   Report Status 04/23/2019 FINAL  Final   Organism ID, Bacteria CITROBACTER FREUNDII (A)  Final      Susceptibility   Citrobacter freundii - MIC*    CEFAZOLIN >=64 RESISTANT Resistant     CEFTRIAXONE <=0.25 SENSITIVE Sensitive     CIPROFLOXACIN <=0.25 SENSITIVE Sensitive     GENTAMICIN <=1 SENSITIVE Sensitive     IMIPENEM <=0.25 SENSITIVE Sensitive     NITROFURANTOIN <=16 SENSITIVE Sensitive     TRIMETH/SULFA <=20 SENSITIVE Sensitive     PIP/TAZO <=4 SENSITIVE Sensitive     * >=100,000 COLONIES/mL CITROBACTER FREUNDII  Culture, blood (routine x 2)     Status: None (Preliminary result)   Collection Time: 04/21/19 11:10 AM   Specimen: BLOOD RIGHT HAND  Result Value Ref Range Status   Specimen Description BLOOD RIGHT HAND  Final   Special Requests   Final    BOTTLES DRAWN AEROBIC AND ANAEROBIC Blood Culture adequate volume   Culture   Final    NO GROWTH 3 DAYS Performed at [facility], [address], [place], [place] [address]    Report Status PENDING  Incomplete  Culture, blood (routine x 2)     Status: None (Preliminary result)   Collection Time: 04/21/19 11:15 AM   Specimen: BLOOD LEFT HAND  Result Value Ref Range Status   Specimen Description BLOOD LEFT HAND  Final   Special Requests AEROBIC BOTTLE ONLY Blood Culture adequate volume  Final   Culture   Final    NO GROWTH 3 DAYS Performed at [facility], [place] [address], [place], [place] [address]    Report Status PENDING  Incomplete  Anaerobic culture     Status: None (Preliminary result)   Collection Time: 04/22/19 11:11 AM   Specimen: PATH Cytology CSF; Cerebrospinal Fluid  Result Value Ref Range Status    Specimen Description CSF  Final   Special Requests NONE  Final   Culture   Final    NO GROWTH 2 DAYS Performed at [facility], [place] [address], [place], [place] [address]    Report Status PENDING  Incomplete  CSF culture     Status: None (Preliminary result)   Collection Time: 04/22/19 11:11 AM   Specimen: PATH Cytology CSF; Cerebrospinal Fluid  Result Value Ref Range Status   Specimen Description CSF  Final   Special Requests NONE  Final   Gram Stain   Final    WBC PRESENT,BOTH PMN AND MONONUCLEAR NO ORGANISMS SEEN CYTOSPIN SMEAR    Culture   Final    NO GROWTH 2 DAYS Performed at [facility], [place] [address], [place], [place] [address]    Report Status PENDING  Incomplete      Radiology Studies: Overnight EEG with video  Result Date: 04/23/2019 Lora Havens, MD     04/24/2019  9:16 AM Patient Name: Claressa Hughley MRN: [id number] Epilepsy Attending: Lora Havens Referring Physician/Provider: Dr. Antony Contras Duration: 04/22/2019 0800 to 04/23/2019 0935  Patient history: 74 year old female presented with altered mental status, left gaze deviation and aphasia.  EEG evaluate for seizures.  Level of alertness: awake, asleep  AEDs during EEG study: None  Technical aspects: This EEG study was done with scalp electrodes positioned according to the 10-20 International system of electrode placement. Electrical activity was acquired at a sampling rate of 500Hz  and reviewed with a high frequency filter of 70Hz  and a low frequency filter of 1Hz . EEG data were recorded continuously and digitally stored. DESCRIPTION: The posterior dominant rhythm consists of 8.5 Hz activity of moderate voltage (25-35 uV) seen predominantly in posterior head regions, asymmetric ( left <right) and reactive to eye opening and eye closing. Sleep was characterized by vertex waves, sleep spindles (12-14Hz ), maximal frontocentral.  EEG showed intermittent 3 to 6 Hz theta-delta slowing in left posterior  temporal region. Hyperventilation and photic stimulation were not performed. Of note, after around 2300 on 04/22/2019, significant electrode artifact was noted and therefore EEG was difficult to interpret.  Abnormality - Background asymmetry, left<right - Intermittent slow, left posterior temporal region  IMPRESSION: This technically difficult study is suggestive of cortical dysfunction in left posterior temporal region, nonspecific etiology but most likely secondary to underlying stroke. No seizures  or epileptiform discharges were seen throughout the recording.  Priyanka O Yadav   VAS [place] LOWER EXTREMITY VENOUS (DVT)  Result Date: 04/24/2019  Lower Venous DVTStudy Indications: Stroke.  Comparison Study: No prior study on file for comparison Performing Technologist: Sharion Dove RVS  Examination Guidelines: A complete evaluation includes B-mode imaging, spectral Doppler, color Doppler, and power Doppler as needed of all accessible portions of each vessel. Bilateral testing is considered an integral part of a complete examination. Limited examinations for reoccurring indications may be performed as noted. The reflux portion of the exam is performed with the patient in reverse Trendelenburg.  +---------+---------------+---------+-----------+----------+--------------+ RIGHT    CompressibilityPhasicitySpontaneityPropertiesThrombus Aging +---------+---------------+---------+-----------+----------+--------------+ CFV      Full           Yes      Yes                                 +---------+---------------+---------+-----------+----------+--------------+ SFJ      Full                                                        +---------+---------------+---------+-----------+----------+--------------+ FV Prox  Full                                                        +---------+---------------+---------+-----------+----------+--------------+ FV Mid   Full                                                         +---------+---------------+---------+-----------+----------+--------------+ FV DistalFull                                                        +---------+---------------+---------+-----------+----------+--------------+ PFV      Full                                                        +---------+---------------+---------+-----------+----------+--------------+ POP      Full           Yes      Yes                                 +---------+---------------+---------+-----------+----------+--------------+ PTV      Full                                                        +---------+---------------+---------+-----------+----------+--------------+ PERO  Full                                                        +---------+---------------+---------+-----------+----------+--------------+   +---------+---------------+---------+-----------+----------+--------------+ LEFT     CompressibilityPhasicitySpontaneityPropertiesThrombus Aging +---------+---------------+---------+-----------+----------+--------------+ CFV      Full           Yes      Yes                                 +---------+---------------+---------+-----------+----------+--------------+ SFJ      Full                                                        +---------+---------------+---------+-----------+----------+--------------+ FV Prox  Full                                                        +---------+---------------+---------+-----------+----------+--------------+ FV Mid   Full                                                        +---------+---------------+---------+-----------+----------+--------------+ FV DistalFull                                                        +---------+---------------+---------+-----------+----------+--------------+ PFV      Full                                                         +---------+---------------+---------+-----------+----------+--------------+ POP      Full           Yes      Yes                                 +---------+---------------+---------+-----------+----------+--------------+ PTV      Full                                                        +---------+---------------+---------+-----------+----------+--------------+ PERO     Full                                                        +---------+---------------+---------+-----------+----------+--------------+  Summary: BILATERAL: - No evidence of deep vein thrombosis seen in the lower extremities, bilaterally.   *See table(s) above for measurements and observations.    Preliminary     Scheduled Meds: .  stroke: mapping our early stages of recovery book   Does not apply Once  . amLODipine  5 mg Oral Daily  . aspirin EC  81 mg Oral Daily  . atorvastatin  40 mg Oral Daily  . Chlorhexidine Gluconate Cloth  6 each Topical Daily  . enoxaparin (LOVENOX) injection  30 mg Subcutaneous Q24H  . gabapentin  300 mg Oral TID  . insulin aspart  0-15 Units Subcutaneous TID WC  . insulin aspart  0-5 Units Subcutaneous QHS  . insulin aspart  2 Units Subcutaneous TID WC  . insulin glargine  20 Units Subcutaneous BID  . lisinopril  10 mg Oral Daily  . pantoprazole (PROTONIX) IV  40 mg Intravenous QHS  . potassium chloride  40 mEq Oral BID   Continuous Infusions: . sodium chloride Stopped (04/23/19 1540)  . cefTRIAXone (ROCEPHIN)  IV       LOS: 4 days   Time spent: 35 minutes.  Patrecia Pour, MD [facility] [email or web address] 04/24/2019, 2:39 PM

## 2019-04-24 NOTE — Progress Notes (Signed)
  Speech Language Pathology Treatment:   Dysphagia tx Patient Details Name: Nichole Munoz MRN: [id number] DOB: 10/31/1945 Today's Date: 04/24/2019 Time: 0940-1005 SLP Time Calculation (min) (ACUTE ONLY): 25 min  Assessment / Plan / Recommendation Clinical Impression  Patient seen for skilled swallow tx and to assess for diet upgrade. PO trials of water and cracker were given with no overt s/s of aspiration. Pt had no throat clear/coughing or vocal changes. She exhibited complete mastication and AP transit of bolus with no oral residue. Recommend diet upgrade to regular consistencies and thin liquids.      HPI: Nichole Munoz is an 74 y.o. female from [place], [place] speaking who presents after family found her down, nonverbal, left gaze deviation and aphasia. 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 CXR no acute intrathoracic process.      SLP Plan     Continue plan of care                      SLP Visit Diagnosis: Dysphagia, unspecified (R13.10)       GO                Wynelle Bourgeois., MA CCC-SLP 04/24/2019, 10:14 AM

## 2019-04-24 NOTE — Progress Notes (Signed)
VASCULAR LAB PRELIMINARY  PRELIMINARY  PRELIMINARY  PRELIMINARY  Bilateral lower extremity venous duplex completed.    Preliminary report:  See CV proc for preliminary results.   Taeveon Keesling, RVT 04/24/2019, 1:33 PM

## 2019-04-25 ENCOUNTER — Encounter (HOSPITAL_COMMUNITY): Payer: Self-pay | Admitting: Certified Registered Nurse Anesthetist

## 2019-04-25 LAB — BASIC METABOLIC PANEL
Anion gap: 11 (ref 5–15)
BUN: 18 mg/dL (ref 8–23)
CO2: 23 mmol/L (ref 22–32)
Calcium: 8.4 mg/dL — ABNORMAL LOW (ref 8.9–10.3)
Chloride: 109 mmol/L (ref 98–111)
Creatinine, Ser: 1.08 mg/dL — ABNORMAL HIGH (ref 0.44–1.00)
GFR calc Af Amer: 59 mL/min — ABNORMAL LOW (ref >60)
GFR calc non Af Amer: 51 mL/min — ABNORMAL LOW (ref >60)
Glucose, Bld: 97 mg/dL (ref 70–99)
Potassium: 4.3 mmol/L (ref 3.5–5.1)
Sodium: 143 mmol/L (ref 135–145)

## 2019-04-25 LAB — CBC
HCT: 33.9 % — ABNORMAL LOW (ref 36.0–46.0)
Hemoglobin: 11.8 g/dL — ABNORMAL LOW (ref 12.0–15.0)
MCH: 30.8 pg (ref 26.0–34.0)
MCHC: 34.8 g/dL (ref 30.0–36.0)
MCV: 88.5 fL (ref 80.0–100.0)
Platelets: 209 K/uL (ref 150–400)
RBC: 3.83 MIL/uL — ABNORMAL LOW (ref 3.87–5.11)
RDW: 11.5 % (ref 11.5–15.5)
WBC: 8.2 K/uL (ref 4.0–10.5)
nRBC: 0 % (ref 0.0–0.2)

## 2019-04-25 LAB — CSF CULTURE W GRAM STAIN: Culture: NO GROWTH

## 2019-04-25 LAB — GLUCOSE, CAPILLARY
Glucose-Capillary: 136 mg/dL — ABNORMAL HIGH (ref 70–99)
Glucose-Capillary: 105 mg/dL — ABNORMAL HIGH (ref 70–99)
Glucose-Capillary: 133 mg/dL — ABNORMAL HIGH (ref 70–99)
Glucose-Capillary: 127 mg/dL — ABNORMAL HIGH (ref 70–99)

## 2019-04-25 LAB — CYTOLOGY - NON PAP

## 2019-04-25 LAB — VDRL, CSF: VDRL Quant, CSF: NONREACTIVE

## 2019-04-25 MED ORDER — SODIUM CHLORIDE 0.9% FLUSH: 10.0000 mL | INTRAVENOUS | Status: DC | PRN

## 2019-04-25 MED ORDER — SODIUM CHLORIDE 0.9 % IV SOLN: INTRAVENOUS | Status: DC | PRN

## 2019-04-25 MED ORDER — ENOXAPARIN SODIUM 40 MG/0.4ML ~~LOC~~ SOLN
40.0000 mg | SUBCUTANEOUS | Status: DC
  Administered 2019-04-25 – 2019-04-26 (×2): 40 mg via SUBCUTANEOUS
  Filled 2019-04-25 (×2): qty 0.4

## 2019-04-25 MED ORDER — PANTOPRAZOLE SODIUM 40 MG PO TBEC
40.0000 mg | DELAYED_RELEASE_TABLET | Freq: Every day | ORAL | Status: DC
  Administered 2019-04-25 – 2019-04-27 (×3): 40 mg via ORAL
  Filled 2019-04-25 (×3): qty 1

## 2019-04-25 MED ORDER — SODIUM CHLORIDE 0.9% FLUSH
10.0000 mL | Freq: Two times a day (BID) | INTRAVENOUS | Status: DC
  Administered 2019-04-25 – 2019-04-27 (×5): 10 mL

## 2019-04-25 NOTE — Progress Notes (Signed)
STROKE TEAM PROGRESS NOTE   INTERVAL HISTORY No family at bedside.  Patient awake alert, laying in bed, no neuro changes. No acute event overnight. TEE canceled due to breakfast taken. Put in NPO order. Pending TEE tomorrow.  Vitals:   04/25/19 0000 04/25/19 0358 04/25/19 0400 04/25/19 0800  BP: 139/68  107/77 (!) 143/79  Pulse: 91  77 97  Resp: 18  14 16   Temp:  98.5 F (36.9 C)    TempSrc:  Axillary    SpO2: 99%  97% 99%  Weight:      Height:        CBC:  Recent Labs  Lab 04/20/19 2152 04/21/19 1115 04/24/19 0353 04/25/19 0314  WBC 20.1*   < > 9.5 8.2  NEUTROABS 15.7*  --   --   --   HGB 13.1   < > 12.3 11.8*  HCT 38.0   < > 35.6* 33.9*  MCV 88.4   < > 87.3 88.5  PLT 324   < > 203 209   < > = values in this interval not displayed.    Basic Metabolic Panel:  Recent Labs  Lab 04/24/19 0353 04/25/19 0314  NA 141 143  K 3.0* 4.3  CL 108 109  CO2 23 23  GLUCOSE 125* 97  BUN 21 18  CREATININE 1.17* 1.08*  CALCIUM 8.3* 8.4*   Lipid Panel:     Component Value Date/Time   CHOL 273 (H) 04/21/2019 1115   TRIG 359 (H) 04/21/2019 1115   HDL 43 04/21/2019 1115   CHOLHDL 6.3 04/21/2019 1115   VLDL 72 (H) 04/21/2019 1115   LDLCALC 158 (H) 04/21/2019 1115   HgbA1c:  Lab Results  Component Value Date   HGBA1C 9.7 (H) 04/21/2019   Urine Drug Screen: No results found for: LABOPIA, COCAINSCRNUR, LABBENZ, AMPHETMU, THCU, LABBARB  Alcohol Level     Component Value Date/Time   ETH <5 04/25/2016 0957    IMAGING past 24 hours VAS [place] LOWER EXTREMITY VENOUS (DVT)  Result Date: 04/24/2019  Lower Venous DVTStudy Indications: Stroke.  Comparison Study: No prior study on file for comparison Performing Technologist: Sharion Dove RVS  Examination Guidelines: A complete evaluation includes B-mode imaging, spectral Doppler, color Doppler, and power Doppler as needed of all accessible portions of each vessel. Bilateral testing is considered an integral part of a complete  examination. Limited examinations for reoccurring indications may be performed as noted. The reflux portion of the exam is performed with the patient in reverse Trendelenburg.  +---------+---------------+---------+-----------+----------+--------------+ RIGHT    CompressibilityPhasicitySpontaneityPropertiesThrombus Aging +---------+---------------+---------+-----------+----------+--------------+ CFV      Full           Yes      Yes                                 +---------+---------------+---------+-----------+----------+--------------+ SFJ      Full                                                        +---------+---------------+---------+-----------+----------+--------------+ FV Prox  Full                                                        +---------+---------------+---------+-----------+----------+--------------+  FV Mid   Full                                                        +---------+---------------+---------+-----------+----------+--------------+ FV DistalFull                                                        +---------+---------------+---------+-----------+----------+--------------+ PFV      Full                                                        +---------+---------------+---------+-----------+----------+--------------+ POP      Full           Yes      Yes                                 +---------+---------------+---------+-----------+----------+--------------+ PTV      Full                                                        +---------+---------------+---------+-----------+----------+--------------+ PERO     Full                                                        +---------+---------------+---------+-----------+----------+--------------+   +---------+---------------+---------+-----------+----------+--------------+ LEFT     CompressibilityPhasicitySpontaneityPropertiesThrombus Aging  +---------+---------------+---------+-----------+----------+--------------+ CFV      Full           Yes      Yes                                 +---------+---------------+---------+-----------+----------+--------------+ SFJ      Full                                                        +---------+---------------+---------+-----------+----------+--------------+ FV Prox  Full                                                        +---------+---------------+---------+-----------+----------+--------------+ FV Mid   Full                                                        +---------+---------------+---------+-----------+----------+--------------+   FV DistalFull                                                        +---------+---------------+---------+-----------+----------+--------------+ PFV      Full                                                        +---------+---------------+---------+-----------+----------+--------------+ POP      Full           Yes      Yes                                 +---------+---------------+---------+-----------+----------+--------------+ PTV      Full                                                        +---------+---------------+---------+-----------+----------+--------------+ PERO     Full                                                        +---------+---------------+---------+-----------+----------+--------------+     Summary: BILATERAL: - No evidence of deep vein thrombosis seen in the lower extremities, bilaterally.   *See table(s) above for measurements and observations. Electronically signed by Monica Martinez MD on 04/24/2019 at 3:10:14 PM.    Final     PHYSICAL EXAM   Temp:  [97.7 F (36.5 C)-98.7 F (37.1 C)] 98.5 F (36.9 C) (04/19 0358) Pulse Rate:  [77-97] 97 (04/19 0800) Resp:  [14-25] 16 (04/19 0800) BP: (98-160)/(68-79) 143/79 (04/19 0800) SpO2:  [97 %-100 %] 99 % (04/19  0800)  General - Well nourished, well developed, in no apparent distress.  Ophthalmologic - fundi not visualized due to noncooperation.  Cardiovascular - Regular rhythm and rate.  Mental Status -  Level of arousal and orientation to time, place, and person were intact. Language including expression, naming, repetition, comprehension was assessed and found intact.  Cranial Nerves II - XII - II - Visual field intact OU. III, IV, VI - Extraocular movements intact. V - Facial sensation intact bilaterally. VII - Facial movement intact bilaterally. VIII - Hearing & vestibular intact bilaterally. X - Palate elevates symmetrically. XI - Chin turning & shoulder shrug intact bilaterally. XII - Tongue protrusion intact.  Motor Strength - The patient's strength was normal in all extremities and pronator drift was absent.  Bulk was normal and fasciculations were absent.   Motor Tone - Muscle tone was assessed at the neck and appendages and was normal.  Reflexes - The patient's reflexes were symmetrical in all extremities and she had no pathological reflexes.  Sensory - Light touch, temperature/pinprick were assessed and were symmetrical.    Coordination - The patient had normal movements in the hands with no ataxia or dysmetria.  Tremor  was absent.  Gait and Station - deferred.   ASSESSMENT / PLAN Nichole Munoz is a 74 y.o. female presenting with acute onset of decreased responsiveness, left gaze deviation and aphasia. She was treated with IV tPA.  Likely seizure and postictal state in the setting of urosepsis.  Overnight EEG 04/23/19 - technically difficultstudy suggestive of cortical dysfunction in left posterior temporal region, nonspecific etiology but most likely secondary to underlying stroke. No seizures orepileptiform discharges were seen throughout the recording.  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 ASPECTS 10.   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  No acute abnormality  2D Echo EF 65 to 70%, no clear cardiac source of embolism.  LE venous Doppler neg DVT  TEE to rule out endocarditis rescheduled for 4/20 as pt ate breakfast  EP consulted. If no cardioembolic source, will evaluate for loop recorder to rule out afib  LDL 158  HgbA1c 10.5  Lovenox for VTE prophylaxis  None prior to admission, now on aspirin 81.  Continue on discharge  Therapy recommendations:  HH PT and OT  Disposition:  Return home  Urosepsis Acute urinary retnention  T-max 102.5->102.9->101.1->afebrile->afebrile  UA WBC more than 50  Urine culture citrobacter  Blood culture NGTD  CSF WBC 6, protein 66, culture neg  Zosyn and vancomycin -> Rocephin  Leukocytosis - WBCs - 20.1->20.6->14.3->9.5->8.2  TEE 4/20 to rule out endocarditis  Hypertension  Home meds:  Zestiril  Stable . Long-term BP goal normotensive  Hyperlipidemia  Home meds:none  LDL 158, goal < 70  On lipitor 40  Continue statin at discharge  Diabetes type II Uncontrolled Diabetic Peripheral Neuropathy  Home meds:  Glucotrol, novolog, lantus, glucophage, tradjenta  HgbA1c 10.5, goal < 7.0  CBGs  SSI  On lantus  Close PCP follow up for better DM control  Other Stroke Risk Factors  Advanced age  Other Active Problems  Language barrier- pt is non-English speaking. We are not able to get a [place] translator at this time. Family has left bedside at time of our exam.  Hypokalemia - 3.0->3.5->3.0 - supplement ordered - 4.3  Acute on CKD stage III - creatinine 1.46 ->1.37->1.11->1.24->1.17->1.08  GERD on PPI   Hospital day # 5  Rosalin Hawking, MD PhD Stroke Neurology 04/25/2019 8:59 AM   To  contact Stroke Continuity provider, please refer to [email or web address] After hours, contact General Neurology
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
## 2019-04-25 NOTE — Progress Notes (Addendum)
Discussed with Dr. Bonner Puna.   Pt likely for TEE today and ? Loop.  ADDENDUM:  Pt could potentially discharge today. Will discuss with family and patient to make sure this is something patient would want and could navigate, and then will plan loop if so.  ADDENDUM: Pt ate breakfast. TEE and possible loop tomorrow, 4/20.  Legrand Como [address] [place], PA-C  04/25/2019 9:02 AM

## 2019-04-25 NOTE — Consult Note (Addendum)
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 04/20/2019 CLINICAL DATA:  Code stroke.  Aphasia and left gaze EXAM: CT HEAD WITHOUT CONTRAST TECHNIQUE: Contiguous axial images were obtained from the base of the skull through the vertex without intravenous contrast. COMPARISON:  01/24/2017 FINDINGS: Brain: Imaging of the pons and posterior fossa is degraded by motion. there is no mass, hemorrhage or extra-axial collection. The size and configuration of the ventricles and extra-axial CSF spaces are normal. There is hypoattenuation of the periventricular white matter, most commonly indicating chronic ischemic microangiopathy. Vascular: No abnormal hyperdensity of the major intracranial arteries or dural venous sinuses. No intracranial atherosclerosis. Skull: The visualized skull base, calvarium and extracranial soft tissues are normal. Sinuses/Orbits: No fluid levels or advanced mucosal thickening of the visualized paranasal sinuses. No mastoid or middle ear effusion. The orbits are normal. ASPECTS [facility] Stroke Program Early CT Score) - Ganglionic  level infarction (caudate, lentiform nuclei, internal capsule, insula, M1-M3 cortex): 7 - Supraganglionic infarction (M4-M6 cortex): 3 Total score (0-10 with 10 being normal): 10 IMPRESSION: 1. Motion degraded study without acute abnormality. 2. ASPECTS is 10. These results were communicated to Dr. Kerney Elbe at 10:11 pm on 04/20/2019 by text page via the [facility] messaging system. Electronically Signed   By: Ulyses Jarred M.D.   On: 04/20/2019 22:11   VAS [place] LOWER EXTREMITY VENOUS (DVT)  Result Date: 04/24/2019  Lower Venous DVTStudy Indications: Stroke.  Comparison Study: No prior study on file for comparison Performing Technologist: Sharion Dove RVS  Examination Guidelines: A complete evaluation includes B-mode imaging, spectral Doppler, color Doppler, and power Doppler as needed of all accessible portions of each vessel. Bilateral testing is considered an integral part of a complete examination. Limited examinations for reoccurring indications may be performed as noted. The reflux portion of the exam is performed with the patient in reverse Trendelenburg.  +---------+---------------+---------+-----------+----------+--------------+  RIGHT     Compressibility Phasicity Spontaneity Properties Thrombus Aging  +---------+---------------+---------+-----------+----------+--------------+  CFV       Full            Yes       Yes                                    +---------+---------------+---------+-----------+----------+--------------+  SFJ       Full                                                             +---------+---------------+---------+-----------+----------+--------------+  FV Prox   Full                                                             +---------+---------------+---------+-----------+----------+--------------+  FV Mid    Full                                                             +---------+---------------+---------+-----------+----------+--------------+  FV Distal Full                                                              +---------+---------------+---------+-----------+----------+--------------+  PFV       Full                                                             +---------+---------------+---------+-----------+----------+--------------+  POP       Full            Yes       Yes                                    +---------+---------------+---------+-----------+----------+--------------+  PTV       Full                                                             +---------+---------------+---------+-----------+----------+--------------+  PERO      Full                                                             +---------+---------------+---------+-----------+----------+--------------+   +---------+---------------+---------+-----------+----------+--------------+  LEFT      Compressibility Phasicity Spontaneity Properties Thrombus Aging  +---------+---------------+---------+-----------+----------+--------------+  CFV       Full            Yes       Yes                                    +---------+---------------+---------+-----------+----------+--------------+  SFJ       Full                                                             +---------+---------------+---------+-----------+----------+--------------+  FV Prox   Full                                                             +---------+---------------+---------+-----------+----------+--------------+  FV Mid    Full                                                             +---------+---------------+---------+-----------+----------+--------------+  FV Distal Full                                                             +---------+---------------+---------+-----------+----------+--------------+  PFV       Full                                                             +---------+---------------+---------+-----------+----------+--------------+  POP       Full            Yes       Yes                                     +---------+---------------+---------+-----------+----------+--------------+  PTV       Full                                                             +---------+---------------+---------+-----------+----------+--------------+  PERO      Full                                                             +---------+---------------+---------+-----------+----------+--------------+     Summary: BILATERAL: - No evidence of deep vein thrombosis seen in the lower extremities, bilaterally.   *See table(s) above for measurements and observations. Electronically signed by Monica Martinez MD on 04/24/2019 at 3:10:14 PM.    Final    DG FL GUIDED LUMBAR PUNCTURE  Result Date: 04/22/2019 CLINICAL DATA:  Fever and leukocytosis. Clinical concern for meningitis. EXAM: DIAGNOSTIC LUMBAR PUNCTURE UNDER FLUOROSCOPIC GUIDANCE FLUOROSCOPY TIME:  Fluoroscopy Time: 6 seconds of low-dose pulsed fluoroscopy Radiation Exposure Index (if provided by the fluoroscopic device): 0.6 mGy Number of Acquired Spot Images: 0 PROCEDURE: Standard time-out was employed. I discussed the risks (including hemorrhage, infection, headache, and nerve damage, among others), benefits, and alternatives to fluoroscopically guided lumbar puncture with the patient's daughter by telephone prior to the procedure. We specifically discussed the high technical likelihood of success of the procedure. The daughter understood and elected to undergo the procedure. An appropriate site for lumbar puncture was marked on the patient's skin under fluoroscopic guidance. Following sterile skin prep and local anesthetic administration consisting of 1 percent lidocaine, a 22 gauge spinal needle was advanced without difficulty into the thecal sac at the at the L2-3 level under fluoroscopic guidance. Clear CSF was returned. Opening pressure was 13 cm measured prone through the needle. 12 cc of clear CSF was collected. The needle was subsequently removed and the skin cleansed and  bandaged. No immediate complications were observed. IMPRESSION: Diagnostic lumbar puncture without immediate complications. Electronically Signed   By: Caryl Comes.D.  On: 04/22/2019 11:30    12-lead ECG 04/20/2019 shows Sinus tachycardia at 114 bpm (personally reviewed) All prior EKG's in EPIC reviewed with no documented atrial fibrillation. All show NSR and sinus tachycardia.  Telemetry NSR 70-90s (personally reviewed)  Assessment and Plan:  1. Cryptogenic stroke The patient presents with cryptogenic stroke.  The patient had a TEE planned for today, but has eaten, and refuses TEE. I spoke at length with the patient about monitoring for afib with an implantable loop recorder.  Daughter present who translated. Risks, benefits, and alteratives to implantable loop recorder were discussed with the patient today.   At this time, the patient refuses loop consideration, and has opted to wear an event monitor  Reviewed indication of TEE with patient and daughter several times. They verbalized understanding that it was to look for source of stroke and to potentially prevent another stroke if we found something that could be treated.  Pt refused. RN present during entirety of our conversation.   Follow up made for 6-8 weeks to review event monitor and further discuss loop. Pt has stated several times she is not interested in loop recorder/implantable device.  Shirley Friar, PA-C 04/26/2019 8:32 AM   I have seen, examined the patient, and reviewed the above assessment and plan.  Changes to above are made where necessary.  On exam, RRR.  The patient is clear in her decision to decline TEE and ILR.  She is willing to to proceed with 30 day monitor.  Follow-up with neurology as scheduled.  Electrophysiology team to see as needed while here. Please call with questions.   Co Sign: Thompson Grayer, MD 04/26/2019 8:49 PM

## 2019-04-25 NOTE — Progress Notes (Signed)
Ipad translator has not worked all day. Several doctors have come by to obtain consent for procedures like the TEE.   Doctors requested family however, I just spoke with daughter and she works night shift so sleeps during day. The daughter, Aalliyah, would like the doctors to just call her [phone], she speaks [place] well.   Doctors can call [phone] for [place]. I just utilized this number myself and it worked Engineer, manufacturing.   Dewaine Oats, RN

## 2019-04-25 NOTE — Progress Notes (Signed)
PROGRESS NOTE  Nichole Munoz  IRS:[id number] DOB: [birth year] DOA: 04/20/2019 PCP: [facility]   Brief Narrative:  Nichole Munoz is a 74 y.o. female with a history of IDT2DM, asthma, HTN who presented to the ED unresponsive with leftward gaze deviation and aphasia triaged as code stroke and given tPA after negative head CT. She was also severely hyperglycemic and febrile with leukocytosis. Pyuria was noted, CXR revealing LLL consolidation which was not new. IV insulin was given and she was admitted to the ICU. Broad antibiotics and IVF boluses given with subsequent improvement lactic acid and stabilizing vital signs. Due to persistent encephalopathy, lumbar puncture yielded clear fluid with protein 66, 6 WBC, 1 RBC, no organisms on gram stain. MRI revealed subacute infarct and chronic microvascular disease. Continuous EEG has been performed and neurology continues to follow the patient, recommending TEE and loop recorder placement.  Assessment & Plan: Sepsis due to Citrobacter UTI: PCT, lactic acid, and WBC trending downward. CXR without new infiltrate, no respiratory symptoms, and lumbar puncture studies not suggestive of bacterial meningitis.  - Continue ceftriaxone, will consolidate Tx with cefdinir at DC - Continue monitoring blood cultures (NG4D), CSF Cx NGTD  Acute encephalopathy, acute CVA: Due to stroke vs. postictal vs. sepsis. Improved significantly. 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 Left posterior temporal dysfunction suggested on EEG LTM. CTA negative for acute stroke, LVO. Received tPA at admission. Echo without CES, LVEF preserved. - Treat conditions as above. Encephalopathy has improved.   - Aspirin, statin - TEE, loop recorder requested by neurology, planned for 4/20.  IDT2DM with hyperglycemia: Chronically poorly-controlled with HbA1c 9.7%.  - Glucose control improved from admission, postprandial elevation  noted, continue lantus 20u BID + mod SSI + HS coverage. Added 2u novolog coverage at mealtime with improvement.  Diabetic peripheral neuropathy:  - Restarted gabapentin. LE U/S negative for DVT.  Acute urinary retention:  - Inserted foley, removed, resolved.  AKI on stage III CKD: Improved with IVF. - Avoid nephrotoxins  HTN: No longer on cleviprex, BPs coming back up - Restarted lisinopril, added norvasc 5mg . Can give prn hydralazine.   Hypokalemia:  - Supplement, resolved.  GERD:  - PPI  DVT Prophylaxis: Lovenox 30mg  q24h Code status: Full Family Communication: None at bedside this AM. Disposition: Likely to be stable for DC after TEE/loop tomorrow.  Antimicrobials: Vancomycin, zosyn > ceftriaxone Procedures:  Lumbar puncture 4/16 EEG 4/16:  Technical aspects: This EEG study was done with scalp electrodes positioned according to the 10-20 International system of electrode placement. Electrical activity was acquired at a sampling rate of 500Hz  and reviewed with a high frequency filter of 70Hz  and a low frequency filter of 1Hz . EEG data were recorded continuously and digitally stored.  DESCRIPTION:The posterior dominant rhythm consists of 8.5 Hz activity of moderate voltage (25-35 uV) seen predominantly in posterior head regions, asymmetric ( left <right) and reactive to eye opening and eye closing. Sleep was characterized by vertex waves, sleep spindles (12-14Hz ), maximal frontocentral.  EEG showed intermittent 3 to 6 Hz theta-delta slowing in left posterior temporal region. Hyperventilation and photic stimulation were not performed.  Of note, after around 2300 on 04/22/2019, significant electrode artifact was noted and therefore EEG was difficult to interpret.   Abnormality - Background asymmetry, left<right - Intermittent slow, left posterior temporal region  IMPRESSION: Thistechnically difficultstudy issuggestive of cortical dysfunction in left posterior temporal  region, nonspecific etiology but most likely secondary to underlying stroke.  No seizures orepileptiform discharges were seen throughout the recording.  Subjective: Leg pain is improved somewhat. No further seizure activity, focal weakness or numbness reported. No interpretor available at time of interview.  Objective: Vitals:   04/25/19 0900 04/25/19 1111 04/25/19 1200 04/25/19 1601  BP:   (!) 147/79 127/70  Pulse: 95  90 77  Resp: 16  15 17   Temp:  98.4 F (36.9 C)  98.5 F (36.9 C)  TempSrc:  Oral  Oral  SpO2: 99%  100% 100%  Weight:      Height:        Intake/Output Summary (Last 24 hours) at 04/25/2019 1610 Last data filed at 04/24/2019 1800 Gross per 24 hour  Intake --  Output 375 ml  Net -375 ml   Filed Weights   04/20/19 2100  Weight: 56.5 kg   Gen: 74 y.o. female in no distress Pulm: Nonlabored breathing room air. Clear. CV: Regular rate and rhythm. No murmur, rub, or gallop. No JVD, no dependent edema. GI: Abdomen soft, non-tender, non-distended, with normoactive bowel sounds.  Ext: Warm, no deformities Skin: No rashes, lesions or ulcers on visualized skin. Neuro: Alert and oriented. No focal neurological deficits. Psych: Judgement and insight appear fair. Mood euthymic & affect congruent. Behavior is appropriate.    Data Reviewed: I have personally reviewed following labs and imaging studies  CBC: Recent Labs  Lab 04/20/19 2152 04/21/19 1115 04/23/19 0339 04/24/19 0353 04/25/19 0314  WBC 20.1* 20.6* 14.3* 9.5 8.2  NEUTROABS 15.7*  --   --   --   --   HGB 13.1 13.9 13.1 12.3 11.8*  HCT 38.0 40.3 37.9 35.6* 33.9*  MCV 88.4 88.4 87.5 87.3 88.5  PLT 324 298 204 203 962   Basic Metabolic Panel: Recent Labs  Lab 04/22/19 0254 04/23/19 0339 04/23/19 2002 04/24/19 0353 04/25/19 0314  NA 145 142 142 141 143  K 3.7 3.0* 3.5 3.0* 4.3  CL 107 106 106 108 109  CO2 25 23 24 23 23   GLUCOSE 104* 112* 163* 125* 97  BUN 35* 20 22 21 18   CREATININE 1.37*  1.11* 1.24* 1.17* 1.08*  CALCIUM 8.3* 8.4* 8.2* 8.3* 8.4*   GFR: Estimated Creatinine Clearance: 37.8 mL/min (A) (by C-G formula based on SCr of 1.08 mg/dL (H)). Liver Function Tests: Recent Labs  Lab 04/20/19 2152  AST 18  ALT 13  ALKPHOS 79  BILITOT 0.4  PROT 8.3*  ALBUMIN 4.0   Coagulation Profile: Recent Labs  Lab 04/20/19 2152 04/21/19 1309  INR 0.9 1.0   HbA1C: No results for input(s): HGBA1C in the last 72 hours. CBG: Recent Labs  Lab 04/24/19 1130 04/24/19 1703 04/24/19 2115 04/25/19 0830 04/25/19 1114  GLUCAP 218* 173* 153* 136* 105*   Lipid Profile: No results for input(s): CHOL, HDL, LDLCALC, TRIG, CHOLHDL, LDLDIRECT in the last 72 hours. Urine analysis:    Component Value Date/Time   COLORURINE YELLOW 04/21/2019 1844   APPEARANCEUR HAZY (A) 04/21/2019 1844   LABSPEC 1.023 04/21/2019 1844   PHURINE 5.0 04/21/2019 1844   GLUCOSEU 150 (A) 04/21/2019 1844   HGBUR MODERATE (A) 04/21/2019 1844   BILIRUBINUR NEGATIVE 04/21/2019 1844   KETONESUR NEGATIVE 04/21/2019 1844   PROTEINUR >=300 (A) 04/21/2019 1844   UROBILINOGEN 1.0 09/23/2014 1458   NITRITE POSITIVE (A) 04/21/2019 1844   LEUKOCYTESUR MODERATE (A) 04/21/2019 1844   Recent Results (from the past 240 hour(s))  Respiratory Panel by RT PCR (Flu A&B, Covid) - Nasopharyngeal Swab  Status: None   Collection Time: 04/21/19 12:20 AM   Specimen: Nasopharyngeal Swab  Result Value Ref Range Status   SARS Coronavirus 2 by RT PCR NEGATIVE NEGATIVE Final    Comment: (NOTE) SARS-CoV-2 target nucleic acids are NOT DETECTED. The SARS-CoV-2 RNA is generally detectable in upper respiratoy specimens during the acute phase of infection. The lowest concentration of SARS-CoV-2 viral copies this assay can detect is 131 copies/mL. A negative result does not preclude SARS-Cov-2 infection and should not be used as the sole basis for treatment or other patient management decisions. A negative result may occur  with  improper specimen collection/handling, submission of specimen other than nasopharyngeal swab, presence of viral mutation(s) within the areas targeted by this assay, and inadequate number of viral copies (<131 copies/mL). A negative result must be combined with clinical observations, patient history, and epidemiological information. The expected result is Negative. Fact Sheet for Patients:  PinkCheek.be Fact Sheet for Healthcare Providers:  GravelBags.it This test is not yet ap proved or cleared by the [place] FDA and  has been authorized for detection and/or diagnosis of SARS-CoV-2 by FDA under an Emergency Use Authorization (EUA). This EUA will remain  in effect (meaning this test can be used) for the duration of the COVID-19 declaration under Section 564(b)(1) of the Act, 21 U.S.C. section 360bbb-3(b)(1), unless the authorization is terminated or revoked sooner.    Influenza A by PCR NEGATIVE NEGATIVE Final   Influenza B by PCR NEGATIVE NEGATIVE Final    Comment: (NOTE) The Xpert Xpress SARS-CoV-2/FLU/RSV assay is intended as an aid in  the diagnosis of influenza from Nasopharyngeal swab specimens and  should not be used as a sole basis for treatment. Nasal washings and  aspirates are unacceptable for Xpert Xpress SARS-CoV-2/FLU/RSV  testing. Fact Sheet for Patients: PinkCheek.be Fact Sheet for Healthcare Providers: GravelBags.it This test is not yet approved or cleared by the [place] FDA and  has been authorized for detection and/or diagnosis of SARS-CoV-2 by  FDA under an Emergency Use Authorization (EUA). This EUA will remain  in effect (meaning this test can be used) for the duration of the  Covid-19 declaration under Section 564(b)(1) of the Act, 21  U.S.C. section 360bbb-3(b)(1), unless the authorization is  terminated or revoked. Performed  at [facility], [place] [address], [place], [place] [address]   Urine culture     Status: Abnormal   Collection Time: 04/21/19 10:40 AM   Specimen: Urine, Random  Result Value Ref Range Status   Specimen Description URINE, RANDOM  Final   Special Requests   Final    NONE Performed at [facility], [place] [address] [place], [place] [address]    Culture >=100,000 COLONIES/mL CITROBACTER FREUNDII (A)  Final   Report Status 04/23/2019 FINAL  Final   Organism ID, Bacteria CITROBACTER FREUNDII (A)  Final      Susceptibility   Citrobacter freundii - MIC*    CEFAZOLIN >=64 RESISTANT Resistant     CEFTRIAXONE <=0.25 SENSITIVE Sensitive     CIPROFLOXACIN <=0.25 SENSITIVE Sensitive     GENTAMICIN <=1 SENSITIVE Sensitive     IMIPENEM <=0.25 SENSITIVE Sensitive     NITROFURANTOIN <=16 SENSITIVE Sensitive     TRIMETH/SULFA <=20 SENSITIVE Sensitive     PIP/TAZO <=4 SENSITIVE Sensitive     * >=100,000 COLONIES/mL CITROBACTER FREUNDII  Culture, blood (routine x 2)     Status: None (Preliminary result)   Collection Time: 04/21/19 11:10 AM  Specimen: BLOOD RIGHT HAND  Result Value Ref Range Status   Specimen Description BLOOD RIGHT HAND  Final   Special Requests   Final    BOTTLES DRAWN AEROBIC AND ANAEROBIC Blood Culture adequate volume   Culture   Final    NO GROWTH 4 DAYS Performed at [facility], [address], [place], [place] [address]    Report Status PENDING  Incomplete  Culture, blood (routine x 2)     Status: None (Preliminary result)   Collection Time: 04/21/19 11:15 AM   Specimen: BLOOD LEFT HAND  Result Value Ref Range Status   Specimen Description BLOOD LEFT HAND  Final   Special Requests AEROBIC BOTTLE ONLY Blood Culture adequate volume  Final   Culture   Final    NO GROWTH 4 DAYS Performed at [facility], [place] [address], [place], [place] [address]    Report Status PENDING  Incomplete  Anaerobic culture     Status: None (Preliminary result)    Collection Time: 04/22/19 11:11 AM   Specimen: PATH Cytology CSF; Cerebrospinal Fluid  Result Value Ref Range Status   Specimen Description CSF  Final   Special Requests NONE  Final   Culture   Final    NO GROWTH 2 DAYS Performed at [facility], [place] [address], [place], [place] [address]    Report Status PENDING  Incomplete  CSF culture     Status: None   Collection Time: 04/22/19 11:11 AM   Specimen: PATH Cytology CSF; Cerebrospinal Fluid  Result Value Ref Range Status   Specimen Description CSF  Final   Special Requests NONE  Final   Gram Stain   Final    WBC PRESENT,BOTH PMN AND MONONUCLEAR NO ORGANISMS SEEN CYTOSPIN SMEAR    Culture   Final    NO GROWTH 3 DAYS Performed at [facility], [place] [address] [place],  [address]    Report Status 04/25/2019 FINAL  Final  Fungus Culture With Stain     Status: None (Preliminary result)   Collection Time: 04/22/19 11:11 AM   Specimen: PATH Cytology CSF; Cerebrospinal Fluid  Result Value Ref Range Status   Fungus Stain Final report  Final    Comment: (NOTE) Performed At: [facility] [place], [place] [id number] Rush Farmer MD MV:[id number]    Fungus (Mycology) Culture PENDING  Incomplete   Fungal Source CSF  Final  Fungus Culture Result     Status: None   Collection Time: 04/22/19 11:11 AM  Result Value Ref Range Status   Result 1 Comment  Final    Comment: (NOTE) KOH/Calcofluor preparation:  no fungus observed. Performed At: [facility] [place], [place] [id number] Rush Farmer MD UU:[id number]       Radiology Studies: VAS [place] LOWER EXTREMITY VENOUS (DVT)  Result Date: 04/24/2019  Lower Venous DVTStudy Indications: Stroke.  Comparison Study: No prior study on file for comparison Performing Technologist: Sharion Dove RVS  Examination Guidelines: A complete evaluation includes B-mode imaging, spectral Doppler, color Doppler, and power Doppler as needed  of all accessible portions of each vessel. Bilateral testing is considered an integral part of a complete examination. Limited examinations for reoccurring indications may be performed as noted. The reflux portion of the exam is performed with the patient in reverse Trendelenburg.  +---------+---------------+---------+-----------+----------+--------------+ RIGHT    CompressibilityPhasicitySpontaneityPropertiesThrombus Aging +---------+---------------+---------+-----------+----------+--------------+ CFV      Full  Yes      Yes                                 +---------+---------------+---------+-----------+----------+--------------+ SFJ      Full                                                        +---------+---------------+---------+-----------+----------+--------------+ FV Prox  Full                                                        +---------+---------------+---------+-----------+----------+--------------+ FV Mid   Full                                                        +---------+---------------+---------+-----------+----------+--------------+ FV DistalFull                                                        +---------+---------------+---------+-----------+----------+--------------+ PFV      Full                                                        +---------+---------------+---------+-----------+----------+--------------+ POP      Full           Yes      Yes                                 +---------+---------------+---------+-----------+----------+--------------+ PTV      Full                                                        +---------+---------------+---------+-----------+----------+--------------+ PERO     Full                                                        +---------+---------------+---------+-----------+----------+--------------+    +---------+---------------+---------+-----------+----------+--------------+ LEFT     CompressibilityPhasicitySpontaneityPropertiesThrombus Aging +---------+---------------+---------+-----------+----------+--------------+ CFV      Full           Yes      Yes                                 +---------+---------------+---------+-----------+----------+--------------+ SFJ  Full                                                        +---------+---------------+---------+-----------+----------+--------------+ FV Prox  Full                                                        +---------+---------------+---------+-----------+----------+--------------+ FV Mid   Full                                                        +---------+---------------+---------+-----------+----------+--------------+ FV DistalFull                                                        +---------+---------------+---------+-----------+----------+--------------+ PFV      Full                                                        +---------+---------------+---------+-----------+----------+--------------+ POP      Full           Yes      Yes                                 +---------+---------------+---------+-----------+----------+--------------+ PTV      Full                                                        +---------+---------------+---------+-----------+----------+--------------+ PERO     Full                                                        +---------+---------------+---------+-----------+----------+--------------+     Summary: BILATERAL: - No evidence of deep vein thrombosis seen in the lower extremities, bilaterally.   *See table(s) above for measurements and observations. Electronically signed by Monica Martinez MD on 04/24/2019 at 3:10:14 PM.    Final     Scheduled Meds: .  stroke: mapping our early stages of recovery book   Does not apply Once  .  amLODipine  5 mg Oral Daily  . aspirin EC  81 mg Oral Daily  . atorvastatin  40 mg Oral Daily  . Chlorhexidine Gluconate Cloth  6 each Topical Daily  . enoxaparin (LOVENOX) injection  40 mg Subcutaneous Q24H  . gabapentin  300 mg Oral TID  .  insulin aspart  0-15 Units Subcutaneous TID WC  . insulin aspart  0-5 Units Subcutaneous QHS  . insulin aspart  2 Units Subcutaneous TID WC  . insulin glargine  20 Units Subcutaneous BID  . lisinopril  10 mg Oral Daily  . pantoprazole  40 mg Oral QHS  . sodium chloride flush  10-40 mL Intracatheter Q12H   Continuous Infusions: . [START ON 04/26/2019] sodium chloride    . cefTRIAXone (ROCEPHIN)  IV 1 g (04/24/19 2129)     LOS: 5 days   Time spent: 25 minutes.  Patrecia Pour, MD [facility] [email or web address] 04/25/2019, 4:10 PM

## 2019-04-26 ENCOUNTER — Encounter (HOSPITAL_COMMUNITY)
Admission: EM | Disposition: A | Discharge status: Home Health | Payer: Self-pay | Source: Home / Self Care | Attending: Family Medicine

## 2019-04-26 ENCOUNTER — Other Ambulatory Visit: Payer: Self-pay | Admitting: Student

## 2019-04-26 ENCOUNTER — Encounter (HOSPITAL_COMMUNITY): Payer: Self-pay | Admitting: Neurology

## 2019-04-26 ENCOUNTER — Encounter: Payer: Self-pay | Admitting: *Deleted

## 2019-04-26 ENCOUNTER — Other Ambulatory Visit (HOSPITAL_COMMUNITY): Payer: Medicare Other

## 2019-04-26 DIAGNOSIS — N390 Urinary tract infection, site not specified: Secondary | ICD-10-CM

## 2019-04-26 DIAGNOSIS — I639 Cerebral infarction, unspecified: Principal | ICD-10-CM

## 2019-04-26 LAB — CBC
HCT: 34.7 % — ABNORMAL LOW (ref 36.0–46.0)
Hemoglobin: 11.3 g/dL — ABNORMAL LOW (ref 12.0–15.0)
MCH: 29.3 pg (ref 26.0–34.0)
MCHC: 32.6 g/dL (ref 30.0–36.0)
MCV: 89.9 fL (ref 80.0–100.0)
Platelets: 237 10*3/uL (ref 150–400)
RBC: 3.86 MIL/uL — ABNORMAL LOW (ref 3.87–5.11)
RDW: 11.5 % (ref 11.5–15.5)
WBC: 10.3 10*3/uL (ref 4.0–10.5)
nRBC: 0 % (ref 0.0–0.2)

## 2019-04-26 LAB — BASIC METABOLIC PANEL
Anion gap: 11 (ref 5–15)
BUN: 30 mg/dL — ABNORMAL HIGH (ref 8–23)
CO2: 22 mmol/L (ref 22–32)
Calcium: 8.7 mg/dL — ABNORMAL LOW (ref 8.9–10.3)
Chloride: 106 mmol/L (ref 98–111)
Creatinine, Ser: 1.45 mg/dL — ABNORMAL HIGH (ref 0.44–1.00)
GFR calc Af Amer: 41 mL/min — ABNORMAL LOW (ref >60)
GFR calc non Af Amer: 35 mL/min — ABNORMAL LOW (ref >60)
Glucose, Bld: 153 mg/dL — ABNORMAL HIGH (ref 70–99)
Potassium: 4.5 mmol/L (ref 3.5–5.1)
Sodium: 139 mmol/L (ref 135–145)

## 2019-04-26 LAB — CULTURE, BLOOD (ROUTINE X 2)
Culture: NO GROWTH
Culture: NO GROWTH
Special Requests: ADEQUATE
Special Requests: ADEQUATE

## 2019-04-26 LAB — GLUCOSE, CAPILLARY
Glucose-Capillary: 148 mg/dL — ABNORMAL HIGH (ref 70–99)
Glucose-Capillary: 189 mg/dL — ABNORMAL HIGH (ref 70–99)
Glucose-Capillary: 143 mg/dL — ABNORMAL HIGH (ref 70–99)
Glucose-Capillary: 235 mg/dL — ABNORMAL HIGH (ref 70–99)

## 2019-04-26 SURGERY — CANCELLED PROCEDURE

## 2019-04-26 MED ORDER — ENOXAPARIN SODIUM 30 MG/0.3ML ~~LOC~~ SOLN
30.0000 mg | SUBCUTANEOUS | Status: DC
  Administered 2019-04-27 – 2019-04-28 (×2): 30 mg via SUBCUTANEOUS
  Filled 2019-04-26 (×2): qty 0.3

## 2019-04-26 MED ORDER — INSULIN ASPART 100 UNIT/ML ~~LOC~~ SOLN
3.0000 [IU] | Freq: Three times a day (TID) | SUBCUTANEOUS | Status: DC
  Administered 2019-04-26 – 2019-04-28 (×5): 3 [IU] via SUBCUTANEOUS

## 2019-04-26 MED ORDER — GABAPENTIN 100 MG PO CAPS
100.0000 mg | ORAL_CAPSULE | Freq: Three times a day (TID) | ORAL | Status: DC
  Administered 2019-04-26 – 2019-04-27 (×3): 100 mg via ORAL
  Filled 2019-04-26 (×3): qty 1

## 2019-04-26 MED ORDER — SODIUM CHLORIDE 0.9 % IV SOLN: INTRAVENOUS | Status: DC | PRN

## 2019-04-26 NOTE — Progress Notes (Signed)
Physical Therapy Treatment Patient Details Name: Nichole Munoz MRN: [id number] DOB: Oct 22, 1945 Today's Date: 04/26/2019    History of Present Illness Nichole Munoz is a 74 y/o female from [place] with pmhx significant for DM, neuropathy, HTN, arhtritis admitted after family found her down and nonverbal.  EMS noted L gaze deviation and aphasia.  Code stroke called, pt received tPA.  CT showed no acute brain abnormality.  Pt was hyperglycemic and febrile, so may have been a pseudo-stroke from sepsis given her profound encephalopathy    PT Comments    Patient seen for mobility progression. Daughter present and interpreted. Pt requires UE support for OOB mobility. This session focused on gait training with use of RW. Pt with chronic L LE pain which limits gait distance. Pt will benefit from further skilled PT services to maximize independence and safety with mobility.     Follow Up Recommendations  Home health PT;Supervision/Assistance - 24 hour     Equipment Recommendations  Rolling walker with 5" wheels;Other (comment)(youth size )    Recommendations for Other Services       Precautions / Restrictions Precautions Precautions: Fall Restrictions Weight Bearing Restrictions: No    Mobility  Bed Mobility Overal bed mobility: Modified Independent Bed Mobility: Supine to Sit;Sit to Supine     Supine to sit: HOB elevated        Transfers Overall transfer level: Needs assistance Equipment used: None;Rolling walker (2 wheeled) Transfers: Sit to/from [facility] Sit to Stand: Min assist         General transfer comment: pt stood from EOB without use of AD and requires at least single UE support in standing with bilat LE braced against EOB; second stand with use of RW; assist to power up and/or stabilize RW to stand; cues for safety  Ambulation/Gait Ambulation/Gait assistance: Min assist;Min guard Gait Distance (Feet): 100 Feet Assistive device: Rolling walker (2  wheeled);1 person hand held assist Gait Pattern/deviations: Step-through pattern;Trunk flexed;Antalgic;Decreased stance time - left;Decreased step length - right;Decreased weight shift to left;Step-to pattern     General Gait Details: multimodal cues for safe use of AD; assist for balance; distance limited due to painful L LE    Stairs             Wheelchair Mobility    Modified Rankin (Stroke Patients Only) Modified Rankin (Stroke Patients Only) Pre-Morbid Rankin Score: No symptoms Modified Rankin: Moderately severe disability     Balance Overall balance assessment: Needs assistance Sitting-balance support: Feet supported Sitting balance-Leahy Scale: Good     Standing balance support: Bilateral upper extremity supported Standing balance-Leahy Scale: Poor Standing balance comment: reliant on UE support                            Cognition Arousal/Alertness: Awake/alert Behavior During Therapy: WFL for tasks assessed/performed Overall Cognitive Status: Difficult to assess                                 General Comments: daughter present and interpreted; decreased safety awareness noted       Exercises      General Comments        Pertinent Vitals/Pain Pain Assessment: Faces Faces Pain Scale: Hurts little more Pain Location: L LE (chronic) Pain Descriptors / Indicators: Guarding;Sore Pain Intervention(s): Limited activity within patient's tolerance;Monitored during session    Home Living  Prior Function            PT Goals (current goals can now be found in the care plan section) Progress towards PT goals: Progressing toward goals    Frequency    Min 3X/week      PT Plan Current plan remains appropriate    Co-evaluation              AM-PAC PT "6 Clicks" Mobility   Outcome Measure  Help needed turning from your back to your side while in a flat bed without using bedrails?: A  Little Help needed moving from lying on your back to sitting on the side of a flat bed without using bedrails?: A Little Help needed moving to and from a bed to a chair (including a wheelchair)?: A Little Help needed standing up from a chair using your arms (e.g., wheelchair or bedside chair)?: A Little Help needed to walk in hospital room?: A Little Help needed climbing 3-5 steps with a railing? : A Lot 6 Click Score: 17    End of Session Equipment Utilized During Treatment: Gait belt Activity Tolerance: Patient tolerated treatment well Patient left: in bed;with call bell/phone within reach;with bed alarm set;with family/visitor present Nurse Communication: Mobility status PT Visit Diagnosis: Unsteadiness on feet (R26.81);Other symptoms and signs involving the nervous system (D74.128)     Time: 7867-6720 PT Time Calculation (min) (ACUTE ONLY): 23 min  Charges:  $Gait Training: 23-37 mins                     Earney Navy, PTA Acute Rehabilitation Services Pager: [phone] Office: [phone]     Darliss Cheney 04/26/2019, 10:04 AM

## 2019-04-26 NOTE — Progress Notes (Signed)
Patient ID: Nichole Munoz, female   DOB: 11-Dec-1945, 74 y.o.   MRN: [id number] Patient enrolled for [employer] to ship a 30 day cardiac event monitor to her daughters address. [name], [address] [place], [place] [address], [phone].  Instructions will be included in monitor kit and have also been mailed to daughters home.

## 2019-04-26 NOTE — Progress Notes (Signed)
Patient NPO after midnight. Patients food from home and drink was taken away and placed on the sink. Pt does not have strength to get out of bed and no bed alarm has gone off. Pt was found at 0615 eating. RN nor tech for the assignment gave the pt the food.

## 2019-04-26 NOTE — Progress Notes (Addendum)
STROKE TEAM PROGRESS NOTE   INTERVAL HISTORY Daughter at bedside.  Patient lying in bed, sleepy and lethargic this morning.  As per patient and daughter, she did not get good sleep last night.  She wants to go home, however creatinine trending up from 1.08 to today 1.45.  Plan to continue IV fluid and encourage p.o. intake, and likely discharge tomorrow.  Vitals:   04/25/19 2057 04/26/19 0000 04/26/19 0452 04/26/19 0800  BP: 136/70 132/79 129/61 124/70  Pulse: 96 93 88 86  Resp: 16 12 19 20   Temp: 98.2 F (36.8 C) 99.1 F (37.3 C) 98.6 F (37 C) 98.2 F (36.8 C)  TempSrc: Oral Oral Oral Oral  SpO2: 100% 100% 100% 100%  Weight:      Height:        CBC:  Recent Labs  Lab 04/20/19 2152 04/21/19 1115 04/25/19 0314 04/26/19 0556  WBC 20.1*   < > 8.2 10.3  NEUTROABS 15.7*  --   --   --   HGB 13.1   < > 11.8* 11.3*  HCT 38.0   < > 33.9* 34.7*  MCV 88.4   < > 88.5 89.9  PLT 324   < > 209 237   < > = values in this interval not displayed.    Basic Metabolic Panel:  Recent Labs  Lab 04/25/19 0314 04/26/19 0556  NA 143 139  K 4.3 4.5  CL 109 106  CO2 23 22  GLUCOSE 97 153*  BUN 18 30*  CREATININE 1.08* 1.45*  CALCIUM 8.4* 8.7*   Lipid Panel:     Component Value Date/Time   CHOL 273 (H) 04/21/2019 1115   TRIG 359 (H) 04/21/2019 1115   HDL 43 04/21/2019 1115   CHOLHDL 6.3 04/21/2019 1115   VLDL 72 (H) 04/21/2019 1115   LDLCALC 158 (H) 04/21/2019 1115   HgbA1c:  Lab Results  Component Value Date   HGBA1C 9.7 (H) 04/21/2019   Urine Drug Screen: No results found for: LABOPIA, COCAINSCRNUR, LABBENZ, AMPHETMU, THCU, LABBARB  Alcohol Level     Component Value Date/Time   ETH <5 04/25/2016 0957    IMAGING past 24 hours No results found.  PHYSICAL EXAM    Temp:  [98.2 F (36.8 C)-99.1 F (37.3 C)] 98.2 F (36.8 C) (04/20 0800) Pulse Rate:  [77-96] 86 (04/20 0800) Resp:  [12-20] 20 (04/20 0800) BP: (124-147)/(61-79) 124/70 (04/20 0800) SpO2:  [100 %] 100  % (04/20 0800)  General - Well nourished, well developed, in no apparent distress, mildly lethargic.  Ophthalmologic - fundi not visualized due to noncooperation.  Cardiovascular - Regular rhythm and rate.  Mental Status -  Lethargic but eyes open, awake alert and orientation to time, place, and person were intact. Language including expression, naming, repetition, comprehension was assessed and found intact.  Cranial Nerves II - XII - II - Visual field intact OU. III, IV, VI - Extraocular movements intact. V - Facial sensation intact bilaterally. VII - Facial movement intact bilaterally. VIII - Hearing & vestibular intact bilaterally. X - Palate elevates symmetrically. XI - Chin turning & shoulder shrug intact bilaterally. XII - Tongue protrusion intact.  Motor Strength - The patient's strength was normal in all extremities and pronator drift was absent.  Bulk was normal and fasciculations were absent.   Motor Tone - Muscle tone was assessed at the neck and appendages and was normal.  Reflexes - The patient's reflexes were symmetrical in all extremities and she had no pathological  reflexes.  Sensory - Light touch, temperature/pinprick were assessed and were symmetrical.    Coordination - The patient had normal movements in the hands with no ataxia or dysmetria.  Tremor was absent.  Gait and Station - deferred.   ASSESSMENT / PLAN [name] is a 74 y.o. female presenting with acute onset of decreased responsiveness, left gaze deviation and aphasia. She was treated with IV tPA.  Likely seizure and postictal state in the setting of urosepsis.  Overnight EEG 04/23/19 - technically difficultstudy suggestive of cortical dysfunction in left posterior temporal region, nonspecific etiology but most likely secondary to underlying stroke. No seizures orepileptiform discharges were seen throughout the recording.  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 ASPECTS 10.   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  No acute abnormality  2D Echo EF 65 to 70%, no clear cardiac source of embolism.  LE venous Doppler neg DVT  Attempted TEE to rule out endocarditis and loop recorder to rule out afib - pt has declined. Willing to consider 30d monitor to look for AF.   LDL 158  HgbA1c 10.5  Lovenox for VTE prophylaxis  None prior to admission, now on aspirin 81.  Continue on discharge  Therapy recommendations:  HH PT and OT  Disposition:  Return home  Follow up stroke clinic at [facility] in 4-6 weeks. Order placed  Urosepsis Acute urinary retnention  T-max 102.5->102.9->101.1->afebrile->afebrile  UA WBC more than 50  Urine culture citrobacter  Blood culture NGTD  CSF WBC 6, protein 66, culture neg  Zosyn and vancomycin -> Rocephin  Leukocytosis - WBCs - 20.1->20.6->14.3->9.5->8.2->10.3  Pt declined TEE to rule out endocarditis  Hypertension  Home meds:  Zestiril  Stable . Long-term BP goal normotensive  Hyperlipidemia  Home meds:none  LDL 158, goal < 70  On lipitor 40  Continue statin at discharge  Diabetes type II Uncontrolled Diabetic Peripheral Neuropathy  Home meds:  Glucotrol, novolog, lantus, glucophage, tradjenta  HgbA1c 10.5, goal < 7.0  CBGs  SSI  On lantus  Close PCP follow up for better DM control  Acute on CKD stage IIIa   cre 1.46 ->1.37->1.11->1.24->1.17->1.08->1.45  On IVF   Encourage p.o. intake  Management per primary team  Other Stroke Risk Factors  Advanced age  Other Active Problems  Language barrier- pt is non-English speaking. We are not able to get a [place] translator at this time. Family  has left bedside at time of our exam.  Hypokalemia - 3.0->3.5->3.0 - supplement ordered - 4.3  GERD on [facility] day # 6  Neurology will sign off. Please call with questions. Pt will follow up with stroke clinic NP at [facility] in about 4 weeks. Thanks for the consult.   Rosalin Hawking, MD PhD Stroke Neurology 04/26/2019 10:49 AM   To contact Stroke Continuity provider, please refer to [email or web address] After hours, contact General Neurology

## 2019-04-26 NOTE — Progress Notes (Addendum)
PROGRESS NOTE  Nichole Munoz  LOV:[id number] DOB: [birth year] DOA: 04/20/2019 PCP: [facility]   Brief Narrative:  Nichole Munoz is a 74 y.o. female with a history of IDT2DM, asthma, HTN who presented to the ED unresponsive with leftward gaze deviation and aphasia triaged as code stroke and given tPA after negative head CT. She was also severely hyperglycemic and febrile with leukocytosis. Pyuria was noted, CXR revealing LLL consolidation which was not new. IV insulin was given and she was admitted to the ICU. Broad antibiotics and IVF boluses given with subsequent improvement lactic acid and stabilizing vital signs. Due to persistent encephalopathy, lumbar puncture yielded clear fluid with protein 66, 6 WBC, 1 RBC, no organisms on gram stain. MRI revealed subacute infarct and chronic microvascular disease. Continuous EEG has been performed and neurology continues to follow the patient, recommending TEE and loop recorder placement. The patient declines but will follow up for outpatient cardiac monitoring.  Assessment & Plan: Sepsis due to Citrobacter UTI: PCT, lactic acid, and WBC trending downward. CXR without new infiltrate, no respiratory symptoms, and lumbar puncture studies not suggestive of bacterial meningitis.  - Continue ceftriaxone, could consolidate Tx with cefdinir at DC - Continue monitoring blood cultures (NG5D), CSF Cx NGTD  Acute encephalopathy, acute CVA: Due to stroke vs. postictal vs. sepsis. Improved significantly. 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 Left posterior temporal dysfunction suggested on EEG LTM. CTA negative for acute stroke, LVO. Received tPA at admission. Echo without CES, LVEF preserved. - Treat conditions as above. Encephalopathy has improved.   - Aspirin, statin - TEE, loop recorder declined by patient, will arrange cardiac monitoring as outpatient. - Neurology referral has been  placed.  IDT2DM with hyperglycemia: Chronically poorly-controlled with HbA1c 9.7%.  - Glucose control improved from admission, postprandial elevation noted, continue lantus 20u BID + mod SSI + HS coverage, will increase mealtime to 3u.   Diabetic peripheral neuropathy:  - Restarted gabapentin. LE U/S negative for DVT. No evidence of cellulitis or gout on exam.  Acute urinary retention:  - Inserted foley, removed, resolved.  AKI on stage III CKD: Improved with IVF initially, has worsened on 4/20 to worse than at admission. Received 60cc contrast during CTA 4/14, no precipitous bouts of hypotension, did receive vancomycin initially. Also had brief urinary retention requiring foley but has been urinating normally since this was removed. - Check PVR r/o retention. - Will hydrate po and by IV and recheck in AM.  - Avoid nephrotoxins as able, stop lisinopril - Decrease gabapentin dosing.  HTN: No longer on cleviprex, BPs coming back up - Restarted lisinopril, added norvasc 5mg . Can give prn hydralazine.   Hypokalemia:  - Supplement, resolved.  GERD:  - PPI  DVT Prophylaxis: Lovenox 30mg  q24h Code status: Full Family Communication: Daughter at bedside this AM Disposition: Pending work up and improvement in AKI. Probably return home 4/21. Antimicrobials: Vancomycin, zosyn > ceftriaxone Procedures:  Lumbar puncture 4/16 EEG 4/16:  Technical aspects: This EEG study was done with scalp electrodes positioned according to the 10-20 International system of electrode placement. Electrical activity was acquired at a sampling rate of 500Hz  and reviewed with a high frequency filter of 70Hz  and a low frequency filter of 1Hz . EEG data were recorded continuously and digitally stored.  DESCRIPTION:The posterior dominant rhythm consists of 8.5 Hz activity of moderate voltage (25-35 uV) seen predominantly in posterior head regions, asymmetric ( left <right) and reactive to eye opening  and eye closing.  Sleep was characterized by vertex waves, sleep spindles (12-14Hz ), maximal frontocentral.  EEG showed intermittent 3 to 6 Hz theta-delta slowing in left posterior temporal region. Hyperventilation and photic stimulation were not performed.  Of note, after around 2300 on 04/22/2019, significant electrode artifact was noted and therefore EEG was difficult to interpret.   Abnormality - Background asymmetry, left<right - Intermittent slow, left posterior temporal region  IMPRESSION: Thistechnically difficultstudy issuggestive of cortical dysfunction in left posterior temporal region, nonspecific etiology but most likely secondary to underlying stroke. No seizures orepileptiform discharges were seen throughout the recording.  Subjective: Daughter at bedside. Per pt preference, she was interpretor for encounter. The patient reports ongoing sharp pains in her legs that were present prior to admission. No dyspnea, chest pain, focal numbness, weakness. Has a good appetite, urinating normally, denies hematuria.   Objective: Vitals:   04/26/19 0000 04/26/19 0452 04/26/19 0800 04/26/19 1223  BP: 132/79 129/61 124/70 127/64  Pulse: 93 88 86 99  Resp: 12 19 20 18   Temp: 99.1 F (37.3 C) 98.6 F (37 C) 98.2 F (36.8 C) 98.7 F (37.1 C)  TempSrc: Oral Oral Oral Oral  SpO2: 100% 100% 100% 100%  Weight:      Height:        Intake/Output Summary (Last 24 hours) at 04/26/2019 1412 Last data filed at 04/25/2019 1733 Gross per 24 hour  Intake 120 ml  Output --  Net 120 ml   Filed Weights   04/20/19 2100  Weight: 56.5 kg   Gen: 74 y.o. female in no distress Pulm: Nonlabored breathing room air. Clear. CV: Regular rate and rhythm. No murmur, rub, or gallop. No JVD, no dependent edema. GI: Abdomen soft, non-tender, non-distended, with normoactive bowel sounds.  Ext: Warm, no deformities. LE's without edema, erythema, induration, joint swelling. There is small annular ecchymosis on dorsal left  foot that is stable. Skin: As above, otherwise no rashes, lesions or ulcers on visualized skin. Neuro: Alert and oriented. No focal neurological deficits. Psych: Judgement and insight appear fair. Mood euthymic & affect congruent. Behavior is appropriate.    Data Reviewed: I have personally reviewed following labs and imaging studies  CBC: Recent Labs  Lab 04/20/19 2152 04/20/19 2152 04/21/19 1115 04/23/19 0339 04/24/19 0353 04/25/19 0314 04/26/19 0556  WBC 20.1*   < > 20.6* 14.3* 9.5 8.2 10.3  NEUTROABS 15.7*  --   --   --   --   --   --   HGB 13.1   < > 13.9 13.1 12.3 11.8* 11.3*  HCT 38.0   < > 40.3 37.9 35.6* 33.9* 34.7*  MCV 88.4   < > 88.4 87.5 87.3 88.5 89.9  PLT 324   < > 298 204 203 209 237   < > = values in this interval not displayed.   Basic Metabolic Panel: Recent Labs  Lab 04/23/19 0339 04/23/19 2002 04/24/19 0353 04/25/19 0314 04/26/19 0556  NA 142 142 141 143 139  K 3.0* 3.5 3.0* 4.3 4.5  CL 106 106 108 109 106  CO2 23 24 23 23 22   GLUCOSE 112* 163* 125* 97 153*  BUN 20 22 21 18  30*  CREATININE 1.11* 1.24* 1.17* 1.08* 1.45*  CALCIUM 8.4* 8.2* 8.3* 8.4* 8.7*   GFR: Estimated Creatinine Clearance: 28.2 mL/min (A) (by C-G formula based on SCr of 1.45 mg/dL (H)). Liver Function Tests: Recent Labs  Lab 04/20/19 2152  AST 18  ALT 13  ALKPHOS 79  BILITOT 0.4  PROT 8.3*  ALBUMIN 4.0   Coagulation Profile: Recent Labs  Lab 04/20/19 2152 04/21/19 1309  INR 0.9 1.0   HbA1C: No results for input(s): HGBA1C in the last 72 hours. CBG: Recent Labs  Lab 04/25/19 1114 04/25/19 1657 04/25/19 2117 04/26/19 0609 04/26/19 1143  GLUCAP 105* 133* 127* 148* 189*   Lipid Profile: No results for input(s): CHOL, HDL, LDLCALC, TRIG, CHOLHDL, LDLDIRECT in the last 72 hours. Urine analysis:    Component Value Date/Time   COLORURINE YELLOW 04/21/2019 1844   APPEARANCEUR HAZY (A) 04/21/2019 1844   LABSPEC 1.023 04/21/2019 1844   PHURINE 5.0 04/21/2019  1844   GLUCOSEU 150 (A) 04/21/2019 1844   HGBUR MODERATE (A) 04/21/2019 1844   BILIRUBINUR NEGATIVE 04/21/2019 1844   KETONESUR NEGATIVE 04/21/2019 1844   PROTEINUR >=300 (A) 04/21/2019 1844   UROBILINOGEN 1.0 09/23/2014 1458   NITRITE POSITIVE (A) 04/21/2019 1844   LEUKOCYTESUR MODERATE (A) 04/21/2019 1844   Recent Results (from the past 240 hour(s))  Respiratory Panel by RT PCR (Flu A&B, Covid) - Nasopharyngeal Swab     Status: None   Collection Time: 04/21/19 12:20 AM   Specimen: Nasopharyngeal Swab  Result Value Ref Range Status   SARS Coronavirus 2 by RT PCR NEGATIVE NEGATIVE Final    Comment: (NOTE) SARS-CoV-2 target nucleic acids are NOT DETECTED. The SARS-CoV-2 RNA is generally detectable in upper respiratoy specimens during the acute phase of infection. The lowest concentration of SARS-CoV-2 viral copies this assay can detect is 131 copies/mL. A negative result does not preclude SARS-Cov-2 infection and should not be used as the sole basis for treatment or other patient management decisions. A negative result may occur with  improper specimen collection/handling, submission of specimen other than nasopharyngeal swab, presence of viral mutation(s) within the areas targeted by this assay, and inadequate number of viral copies (<131 copies/mL). A negative result must be combined with clinical observations, patient history, and epidemiological information. The expected result is Negative. Fact Sheet for Patients:  PinkCheek.be Fact Sheet for Healthcare Providers:  GravelBags.it This test is not yet ap proved or cleared by the [place] FDA and  has been authorized for detection and/or diagnosis of SARS-CoV-2 by FDA under an Emergency Use Authorization (EUA). This EUA will remain  in effect (meaning this test can be used) for the duration of the COVID-19 declaration under Section 564(b)(1) of the Act, 21  U.S.C. section 360bbb-3(b)(1), unless the authorization is terminated or revoked sooner.    Influenza A by PCR NEGATIVE NEGATIVE Final   Influenza B by PCR NEGATIVE NEGATIVE Final    Comment: (NOTE) The Xpert Xpress SARS-CoV-2/FLU/RSV assay is intended as an aid in  the diagnosis of influenza from Nasopharyngeal swab specimens and  should not be used as a sole basis for treatment. Nasal washings and  aspirates are unacceptable for Xpert Xpress SARS-CoV-2/FLU/RSV  testing. Fact Sheet for Patients: PinkCheek.be Fact Sheet for Healthcare Providers: GravelBags.it This test is not yet approved or cleared by the [place] FDA and  has been authorized for detection and/or diagnosis of SARS-CoV-2 by  FDA under an Emergency Use Authorization (EUA). This EUA will remain  in effect (meaning this test can be used) for the duration of the  Covid-19 declaration under Section 564(b)(1) of the Act, 21  U.S.C. section 360bbb-3(b)(1), unless the authorization is  terminated or revoked. Performed at [facility], [place] [address] [place], [place] [address]   Urine culture  Status: Abnormal   Collection Time: 04/21/19 10:40 AM   Specimen: Urine, Random  Result Value Ref Range Status   Specimen Description URINE, RANDOM  Final   Special Requests   Final    NONE Performed at [facility], [address] [place], [place] [address]    Culture >=100,000 COLONIES/mL CITROBACTER FREUNDII (A)  Final   Report Status 04/23/2019 FINAL  Final   Organism ID, Bacteria CITROBACTER FREUNDII (A)  Final      Susceptibility   Citrobacter freundii - MIC*    CEFAZOLIN >=64 RESISTANT Resistant     CEFTRIAXONE <=0.25 SENSITIVE Sensitive     CIPROFLOXACIN <=0.25 SENSITIVE Sensitive     GENTAMICIN <=1 SENSITIVE Sensitive     IMIPENEM <=0.25 SENSITIVE Sensitive     NITROFURANTOIN <=16 SENSITIVE Sensitive     TRIMETH/SULFA <=20 SENSITIVE  Sensitive     PIP/TAZO <=4 SENSITIVE Sensitive     * >=100,000 COLONIES/mL CITROBACTER FREUNDII  Culture, blood (routine x 2)     Status: None   Collection Time: 04/21/19 11:10 AM   Specimen: BLOOD RIGHT HAND  Result Value Ref Range Status   Specimen Description BLOOD RIGHT HAND  Final   Special Requests   Final    BOTTLES DRAWN AEROBIC AND ANAEROBIC Blood Culture adequate volume   Culture   Final    NO GROWTH 5 DAYS Performed at [facility], [address], [place], [place] [address]    Report Status 04/26/2019 FINAL  Final  Culture, blood (routine x 2)     Status: None   Collection Time: 04/21/19 11:15 AM   Specimen: BLOOD LEFT HAND  Result Value Ref Range Status   Specimen Description BLOOD LEFT HAND  Final   Special Requests AEROBIC BOTTLE ONLY Blood Culture adequate volume  Final   Culture   Final    NO GROWTH 5 DAYS Performed at [facility], [place] [address] [place], [place] [address]    Report Status 04/26/2019 FINAL  Final  Anaerobic culture     Status: None (Preliminary result)   Collection Time: 04/22/19 11:11 AM   Specimen: PATH Cytology CSF; Cerebrospinal Fluid  Result Value Ref Range Status   Specimen Description CSF  Final   Special Requests NONE  Final   Culture   Final    NO GROWTH 2 DAYS Performed at [facility], [place] [address], [place], [place] [address]    Report Status PENDING  Incomplete  CSF culture     Status: None   Collection Time: 04/22/19 11:11 AM   Specimen: PATH Cytology CSF; Cerebrospinal Fluid  Result Value Ref Range Status   Specimen Description CSF  Final   Special Requests NONE  Final   Gram Stain   Final    WBC PRESENT,BOTH PMN AND MONONUCLEAR NO ORGANISMS SEEN CYTOSPIN SMEAR    Culture   Final    NO GROWTH 3 DAYS Performed at [facility], [place] [address] [place], [place] [address]    Report Status 04/25/2019 FINAL  Final  Fungus Culture With Stain     Status: None (Preliminary result)   Collection  Time: 04/22/19 11:11 AM   Specimen: PATH Cytology CSF; Cerebrospinal Fluid  Result Value Ref Range Status   Fungus Stain Final report  Final    Comment: (NOTE) Performed At: [facility] [place], [place] [id number] Rush Farmer MD IO:[id number]    Fungus (Mycology) Culture PENDING  Incomplete  Fungal Source CSF  Final  Fungus Culture Result     Status: None   Collection Time: 04/22/19 11:11 AM  Result Value Ref Range Status   Result 1 Comment  Final    Comment: (NOTE) KOH/Calcofluor preparation:  no fungus observed. Performed At: [facility] [place], [place] [id number] Rush Farmer MD DP:[id number]       Radiology Studies: No results found.  Scheduled Meds: .  stroke: mapping our early stages of recovery book   Does not apply Once  . amLODipine  5 mg Oral Daily  . aspirin EC  81 mg Oral Daily  . atorvastatin  40 mg Oral Daily  . Chlorhexidine Gluconate Cloth  6 each Topical Daily  . enoxaparin (LOVENOX) injection  40 mg Subcutaneous Q24H  . gabapentin  300 mg Oral TID  . insulin aspart  0-15 Units Subcutaneous TID WC  . insulin aspart  0-5 Units Subcutaneous QHS  . insulin aspart  2 Units Subcutaneous TID WC  . insulin glargine  20 Units Subcutaneous BID  . pantoprazole  40 mg Oral QHS  . sodium chloride flush  10-40 mL Intracatheter Q12H   Continuous Infusions: . sodium chloride 50 mL/hr at 04/26/19 1022  . cefTRIAXone (ROCEPHIN)  IV 1 g (04/25/19 2309)     LOS: 6 days   Time spent: 35 minutes.  Patrecia Pour, MD [facility] [email or web address] 04/26/2019, 2:12 PM

## 2019-04-26 NOTE — TOC Progression Note (Addendum)
Transition of Care [facility]) - Progression Note    Patient Details  Name: Nichole Munoz MRN: [id number] Date of Birth: 1945/10/08  Transition of Care [facility]) CM/SW Contact  Pollie Friar, RN Phone Number: 04/26/2019, 11:07 AM  Clinical Narrative:    CM met with the daughter in the room. Choice made to use [place] for patients [facility] services. Butch Penny with [facility] has accepted the referral.  Pt will have youth walker delivered to the room per [employer].  Family to provide supervision at home and transport to home when medically ready.  Pt will mainly be staying at daughters home: [address] in [place]. [facility] is aware.  TOC following.  Per [employer] the patient received a walker in September. Medicaid will not pay for another walker. Daughter updated and will see if she has a walker at home.    Expected Discharge Plan: [place] Barriers to Discharge: Continued Medical Work up  Expected Discharge Plan and Services Expected Discharge Plan: [place]   Discharge Planning Services: CM Consult Post Acute Care Choice: Home Health, Durable Medical Equipment                   DME Arranged: (youth size)   Date DME Agency Contacted: 04/26/19   Representative spoke with at DME Agency: Gwinda Passe [place]: PT, OT [place] Agency: [place] ([place]) Date Anthem: 04/26/19   Representative spoke with at [place]: [place] ([place]) Interventions    Readmission Risk Interventions No flowsheet data found.

## 2019-04-26 NOTE — Progress Notes (Signed)
Unable to draw blood for morning labs. Blood draw was minimal with lots of air. Will notify lab to draw.

## 2019-04-27 ENCOUNTER — Inpatient Hospital Stay (HOSPITAL_COMMUNITY): Payer: Medicare Other

## 2019-04-27 DIAGNOSIS — E119 Type 2 diabetes mellitus without complications: Secondary | ICD-10-CM

## 2019-04-27 DIAGNOSIS — A498 Other bacterial infections of unspecified site: Secondary | ICD-10-CM

## 2019-04-27 DIAGNOSIS — Z794 Long term (current) use of insulin: Secondary | ICD-10-CM

## 2019-04-27 LAB — CBC
HCT: 29.8 % — ABNORMAL LOW (ref 36.0–46.0)
Hemoglobin: 9.9 g/dL — ABNORMAL LOW (ref 12.0–15.0)
MCH: 29.8 pg (ref 26.0–34.0)
MCHC: 33.2 g/dL (ref 30.0–36.0)
MCV: 89.8 fL (ref 80.0–100.0)
Platelets: 229 10*3/uL (ref 150–400)
RBC: 3.32 MIL/uL — ABNORMAL LOW (ref 3.87–5.11)
RDW: 11.4 % — ABNORMAL LOW (ref 11.5–15.5)
WBC: 9.4 10*3/uL (ref 4.0–10.5)
nRBC: 0 % (ref 0.0–0.2)

## 2019-04-27 LAB — GLUCOSE, CAPILLARY
Glucose-Capillary: 198 mg/dL — ABNORMAL HIGH (ref 70–99)
Glucose-Capillary: 76 mg/dL (ref 70–99)
Glucose-Capillary: 164 mg/dL — ABNORMAL HIGH (ref 70–99)
Glucose-Capillary: 99 mg/dL (ref 70–99)

## 2019-04-27 LAB — ANAEROBIC CULTURE

## 2019-04-27 LAB — BASIC METABOLIC PANEL
Anion gap: 9 (ref 5–15)
BUN: 32 mg/dL — ABNORMAL HIGH (ref 8–23)
CO2: 22 mmol/L (ref 22–32)
Calcium: 8.3 mg/dL — ABNORMAL LOW (ref 8.9–10.3)
Chloride: 108 mmol/L (ref 98–111)
Creatinine, Ser: 1.35 mg/dL — ABNORMAL HIGH (ref 0.44–1.00)
GFR calc Af Amer: 45 mL/min — ABNORMAL LOW (ref >60)
GFR calc non Af Amer: 39 mL/min — ABNORMAL LOW (ref >60)
Glucose, Bld: 227 mg/dL — ABNORMAL HIGH (ref 70–99)
Potassium: 4.3 mmol/L (ref 3.5–5.1)
Sodium: 139 mmol/L (ref 135–145)

## 2019-04-27 MED ORDER — GABAPENTIN 100 MG PO CAPS
200.0000 mg | ORAL_CAPSULE | Freq: Three times a day (TID) | ORAL | Status: DC
  Administered 2019-04-27 – 2019-04-28 (×3): 200 mg via ORAL
  Filled 2019-04-27 (×3): qty 2

## 2019-04-27 MED ORDER — CHLORHEXIDINE GLUCONATE CLOTH 2 % EX PADS
6.0000 | MEDICATED_PAD | Freq: Every day | CUTANEOUS | Status: DC
  Administered 2019-04-28: 6 via TOPICAL

## 2019-04-27 MED ORDER — INSULIN GLARGINE 100 UNIT/ML ~~LOC~~ SOLN
20.0000 [IU] | Freq: Two times a day (BID) | SUBCUTANEOUS | Status: DC
  Administered 2019-04-28: 20 [IU] via SUBCUTANEOUS
  Filled 2019-04-27 (×2): qty 0.2

## 2019-04-27 NOTE — Progress Notes (Signed)
Occupational Therapy Treatment Patient Details Name: Nichole Munoz MRN: [id number] DOB: [birth year] Today's Date: 04/27/2019    History of present illness Nichole Munoz is a 74 y/o female from [place] with pmhx significant for DM, neuropathy, HTN, arhtritis admitted after family found her down and nonverbal.  EMS noted L gaze deviation and aphasia.  Code stroke called, pt received tPA.  CT showed no acute brain abnormality.  Pt was hyperglycemic and febrile, so may have been a pseudo-stroke from sepsis given her profound encephalopathy   OT comments  Pt demonstrates min guard (A) with basic transfer wearing her shoes from home. Pt tolerates session well and using bil UE to self feed this session. Pt tracking and locating objects in all visual fields. Pt scanning L to locate utensils on food tray. Pt opening lunch tray and saying "yuck!" Pt able to express basic needs in english this session.   Follow Up Recommendations  Home health OT;Supervision/Assistance - 24 hour    Equipment Recommendations  Other (comment)    Recommendations for Other Services      Precautions / Restrictions Precautions Precautions: Fall Precaution Comments: foley due to urine retention       Mobility Bed Mobility Overal bed mobility: Modified Independent                Transfers Overall transfer level: Needs assistance Equipment used: Rolling walker (2 wheeled) Transfers: Sit to/from Stand Sit to Stand: Min guard         General transfer comment: pt transfer with personal shoes on this session    Balance                                           ADL either performed or assessed with clinical judgement   ADL Overall ADL's : Needs assistance/impaired Eating/Feeding: Modified independent Eating/Feeding Details (indicate cue type and reason): able to pull off lid to ice push pop and use a spoon to eat from the opening that is size of a 50 cent piece and reach out side base of  support to get utensils off tray.  Grooming: Modified independent               Lower Body Dressing: Min guard Lower Body Dressing Details (indicate cue type and reason): don slippers tolerated and walked with on with decrease pain Toilet Transfer: Min guard           Functional mobility during ADLs: Min guard;Rolling walker General ADL Comments: Pt tolerated OOB to bathroom and back min guard (A) RW. pt demonstrates abilityt o d/c home at this level family (A) for supervision     Vision       Perception     Praxis      Cognition Arousal/Alertness: Awake/alert Behavior During Therapy: WFL for tasks assessed/performed Overall Cognitive Status: Difficult to assess                                          Exercises     Shoulder Instructions       General Comments pt noted to have bruise on top of L foot with a small puncture wound in the center. Question if this could be from possible IV team placement as pt has had multiple attempts for IV placement this  admission    Pertinent Vitals/ Pain       Pain Assessment: Faces Faces Pain Scale: Hurts little more Pain Location: L foot ( top of foot) Pain Descriptors / Indicators: Guarding;Sore Pain Intervention(s): Monitored during session;Repositioned;Premedicated before session  Home Living                                          Prior Functioning/Environment              Frequency  Min 2X/week        Progress Toward Goals  OT Goals(current goals can now be found in the care plan section)  Progress towards OT goals: Progressing toward goals  Acute Rehab OT Goals Patient Stated Goal: to go home OT Goal Formulation: Patient unable to participate in goal setting Time For Goal Achievement: 05/06/19 ADL Goals Pt Will Perform Grooming: standing;with supervision Pt Will Perform Upper Body Bathing: with set-up;sitting(met) Pt Will Perform Lower Body Bathing: with  supervision;sit to/from stand Pt Will Perform Upper Body Dressing: with set-up;sitting Pt Will Perform Lower Body Dressing: with supervision;sit to/from stand Pt Will Transfer to Toilet: with supervision;ambulating Pt Will Perform Toileting - Clothing Manipulation and hygiene: with supervision;sit to/from stand  Plan Discharge plan remains appropriate    Co-evaluation                 AM-PAC OT "6 Clicks" Daily Activity     Outcome Measure   Help from another person eating meals?: None Help from another person taking care of personal grooming?: A Little Help from another person toileting, which includes using toliet, bedpan, or urinal?: A Little Help from another person bathing (including washing, rinsing, drying)?: A Little Help from another person to put on and taking off regular upper body clothing?: A Little Help from another person to put on and taking off regular lower body clothing?: A Little 6 Click Score: 19    End of Session Equipment Utilized During Treatment: Rolling walker  OT Visit Diagnosis: Unsteadiness on feet (R26.81);Other abnormalities of gait and mobility (R26.89);Muscle weakness (generalized) (M62.81)   Activity Tolerance Patient tolerated treatment well   Patient Left in bed;with call bell/phone within reach;with bed alarm set;with nursing/sitter in room   Nurse Communication Mobility status;Precautions        Time: 9021-1155 OT Time Calculation (min): 20 min  Charges: OT General Charges $OT Visit: 1 Visit OT Treatments $Self Care/Home Management : 8-22 mins   Brynn, OTR/L  Acute Rehabilitation Services Pager: [phone] Office: [phone] .    Jeri Modena 04/27/2019, 4:21 PM

## 2019-04-27 NOTE — Progress Notes (Deleted)
Family refused to take patient home 4/21 due to "inability to walk" which was not an issue later in the day with PT - family should come get her in the am

## 2019-04-27 NOTE — Progress Notes (Signed)
  Speech Language Pathology    Patient Details Name: Nichole Munoz MRN: [id number] DOB: 12/14/1945 Today's Date: 04/27/2019 Time:  -     ST has been working with pt re: dysphagia. Chart reviewed and intake appears adequate from what is documented. No difficulty from previous ST session. Recommend she continue regular texture, thin and ST will sign off at this time.                 Houston Siren 04/27/2019, 4:51 PM    Orbie Pyo Colvin Caroli.Ed Risk analyst [phone] Office [phone]

## 2019-04-27 NOTE — Progress Notes (Signed)
PROGRESS NOTE  Nichole Munoz  ION:[id number] DOB: [birth year] DOA: 04/20/2019 PCP: [facility]   Brief Narrative:  Nichole Munoz is a 74 y.o. female with a history of IDT2DM, asthma, HTN who presented to the ED unresponsive with leftward gaze deviation and aphasia triaged as code stroke and given tPA after negative head CT. She was also severely hyperglycemic and febrile with leukocytosis. Pyuria was noted, CXR revealing LLL consolidation which was not new. IV insulin was given and she was admitted to the ICU. Broad antibiotics and IVF boluses given with subsequent improvement lactic acid and stabilizing vital signs. Due to persistent encephalopathy, lumbar puncture yielded clear fluid with protein 66, 6 WBC, 1 RBC, no organisms on gram stain. MRI revealed subacute infarct and chronic microvascular disease. Continuous EEG has been performed and neurology continues to follow the patient, recommending TEE and loop recorder placement. The patient declines but will follow up for outpatient cardiac monitoring.  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 CXR without new infiltrate, no respiratory symptoms, and lumbar puncture studies not suggestive of bacterial meningitis.  - Patient to complete ceftriaxone today 04/27/2019 - Continue monitoring blood cultures (NG5D), CSF Cx NGTD - Remains afebrile without leukocytosis  Acute encephalopathy, acute CVA: Due to stroke vs. postictal vs. sepsis. Improved significantly. 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 Left posterior temporal dysfunction suggested on EEG LTM. CTA negative for acute stroke, LVO. Received tPA at admission. Echo without CES, LVEF preserved. - Treat conditions as  above. Encephalopathy has improved.   - Aspirin, statin - TEE, loop recorder declined by patient, will arrange cardiac monitoring as outpatient. - Neurology referral has been placed.  IDT2DM with hyperglycemia: Chronically poorly-controlled with HbA1c 9.7%.  - Glucose control improved from admission, postprandial elevation noted, continue lantus 20u BID + mod SSI + HS coverage, will increase mealtime to 3u.   Diabetic peripheral neuropathy:  - Restarted gabapentin. LE U/S negative for DVT. No evidence of cellulitis or gout on exam - Left foot 2 view x-ray today unremarkable.  Acute urinary retention, ongoing:  - Foley catheter previously inserted and removed, continues to require in and out catheter over the past 24 hours, will replace Foley catheter - may need to follow up with urology in the outpatient setting  AKI on stage III CKD: - In the setting of hypovolemia, contrast with CTA as well as vancomycin administration - Appears to be improving per hours - Holding lisinopril  HTN: No longer on cleviprex, BPs coming back up -Continue amlodipine controlled off lisinopril  Hypokalemia:  - Supplement, resolved  GERD:  - PPI  DVT Prophylaxis: Lovenox 30mg  q24h Code status: Full Family Communication: Daughter over the phone Disposition: Pending further evaluation and treatment, continues to require evaluation for new onset foot pain, elevated creatinine continued IV fluids and Foley catheter placement.  Probably return home in the next 24 to 48 hours. Antimicrobials: Vancomycin, zosyn > ceftriaxone - stopped 4/21  Procedures:   Lumbar puncture 4/16  EEG 4/16:  Technical aspects: This EEG study was done with scalp electrodes positioned according to the 10-20 International system of electrode placement. Electrical activity was acquired at a sampling rate of 500Hz  and reviewed with a high frequency filter of  70Hz  and a low frequency filter of 1Hz . EEG data were recorded continuously  and digitally stored.  DESCRIPTION:The posterior dominant rhythm consists of 8.5 Hz activity of moderate voltage (25-35 uV) seen predominantly in posterior head regions, asymmetric ( left <right) and reactive to eye opening and eye closing. Sleep was characterized by vertex waves, sleep spindles (12-14Hz ), maximal frontocentral.  EEG showed intermittent 3 to 6 Hz theta-delta slowing in left posterior temporal region. Hyperventilation and photic stimulation were not performed. Of note, after around 2300 on 04/22/2019, significant electrode artifact was noted and therefore EEG was difficult to interpret.   Abnormality - Background asymmetry, left<right - Intermittent slow, left posterior temporal region  IMPRESSION: Thistechnically difficultstudy issuggestive of cortical dysfunction in left posterior temporal region, nonspecific etiology but most likely secondary to underlying stroke. No seizures orepileptiform discharges were seen throughout the recording.  Subjective: Patient declines dyspnea, chest pain, focal numbness, weakness. Has a good appetite, urinating normally, denies hematuria.  She does admit to ongoing left foot pain.  Objective: Vitals:   04/26/19 2045 04/27/19 0055 04/27/19 0435 04/27/19 0730  BP: 102/87 112/60 132/70 128/78  Pulse: 91 92 (!) 106   Resp: 20 18  20   Temp: 99.7 F (37.6 C) 99.6 F (37.6 C) 99.3 F (37.4 C) 98.9 F (37.2 C)  TempSrc: Oral Oral Oral Oral  SpO2: 98% 100% 100% 98%  Weight:      Height:        Intake/Output Summary (Last 24 hours) at 04/27/2019 1333 Last data filed at 04/27/2019 1005 Gross per 24 hour  Intake -  Output 2278 ml  Net -2278 ml   Filed Weights   04/20/19 2100  Weight: 56.5 kg   Gen: 74 y.o. female in no distress HEENT: Pupils equal round reactive to light, without scleral icterus injection, mucous membranes moist Pulm: Nonlabored breathing room air. Clear. CV: Regular rate and rhythm. No murmur, rub, or gallop.  No JVD, no dependent edema. GI: Abdomen soft, non-tender, non-distended, with normoactive bowel sounds.  Ext: Warm, no deformities. LE's without edema, erythema, induration, joint swelling. There is small annular ecchymosis on dorsal left foot that is stable. Skin: As above, otherwise no rashes, lesions or ulcers on visualized skin. Neuro: Alert and oriented. No focal neurological deficits.   Data Reviewed: I have personally reviewed following labs and imaging studies  CBC: Recent Labs  Lab 04/20/19 2152 04/21/19 1115 04/23/19 0339 04/24/19 0353 04/25/19 0314 04/26/19 0556 04/27/19 0706  WBC 20.1*   < > 14.3* 9.5 8.2 10.3 9.4  NEUTROABS 15.7*  --   --   --   --   --   --   HGB 13.1   < > 13.1 12.3 11.8* 11.3* 9.9*  HCT 38.0   < > 37.9 35.6* 33.9* 34.7* 29.8*  MCV 88.4   < > 87.5 87.3 88.5 89.9 89.8  PLT 324   < > 204 203 209 237 229   < > = values in this interval not displayed.   Basic Metabolic Panel: Recent Labs  Lab 04/23/19 2002 04/24/19 0353 04/25/19 0314 04/26/19 0556 04/27/19 0706  NA 142 141 143 139 139  K 3.5 3.0* 4.3 4.5 4.3  CL 106 108 109 106 108  CO2 24 23 23 22 22   GLUCOSE 163* 125* 97 153* 227*  BUN 22 21 18  30* 32*  CREATININE 1.24* 1.17* 1.08* 1.45* 1.35*  CALCIUM 8.2* 8.3* 8.4* 8.7* 8.3*   GFR: Estimated Creatinine Clearance: 30.2 mL/min (A) (by  C-G formula based on SCr of 1.35 mg/dL (H)). Liver Function Tests: Recent Labs  Lab 04/20/19 2152  AST 18  ALT 13  ALKPHOS 79  BILITOT 0.4  PROT 8.3*  ALBUMIN 4.0   Coagulation Profile: Recent Labs  Lab 04/20/19 2152 04/21/19 1309  INR 0.9 1.0   HbA1C: No results for input(s): HGBA1C in the last 72 hours. CBG: Recent Labs  Lab 04/26/19 1143 04/26/19 1713 04/26/19 2228 04/27/19 0556 04/27/19 1146  GLUCAP 189* 143* 235* 198* 76   Lipid Profile: No results for input(s): CHOL, HDL, LDLCALC, TRIG, CHOLHDL, LDLDIRECT in the last 72 hours. Urine analysis:    Component Value Date/Time    COLORURINE YELLOW 04/21/2019 1844   APPEARANCEUR HAZY (A) 04/21/2019 1844   LABSPEC 1.023 04/21/2019 1844   PHURINE 5.0 04/21/2019 1844   GLUCOSEU 150 (A) 04/21/2019 1844   HGBUR MODERATE (A) 04/21/2019 1844   BILIRUBINUR NEGATIVE 04/21/2019 1844   KETONESUR NEGATIVE 04/21/2019 1844   PROTEINUR >=300 (A) 04/21/2019 1844   UROBILINOGEN 1.0 09/23/2014 1458   NITRITE POSITIVE (A) 04/21/2019 1844   LEUKOCYTESUR MODERATE (A) 04/21/2019 1844   Recent Results (from the past 240 hour(s))  Respiratory Panel by RT PCR (Flu A&B, Covid) - Nasopharyngeal Swab     Status: None   Collection Time: 04/21/19 12:20 AM   Specimen: Nasopharyngeal Swab  Result Value Ref Range Status   SARS Coronavirus 2 by RT PCR NEGATIVE NEGATIVE Final    Comment: (NOTE) SARS-CoV-2 target nucleic acids are NOT DETECTED. The SARS-CoV-2 RNA is generally detectable in upper respiratoy specimens during the acute phase of infection. The lowest concentration of SARS-CoV-2 viral copies this assay can detect is 131 copies/mL. A negative result does not preclude SARS-Cov-2 infection and should not be used as the sole basis for treatment or other patient management decisions. A negative result may occur with  improper specimen collection/handling, submission of specimen other than nasopharyngeal swab, presence of viral mutation(s) within the areas targeted by this assay, and inadequate number of viral copies (<131 copies/mL). A negative result must be combined with clinical observations, patient history, and epidemiological information. The expected result is Negative. Fact Sheet for Patients:  PinkCheek.be Fact Sheet for Healthcare Providers:  GravelBags.it This test is not yet ap proved or cleared by the [place] FDA and  has been authorized for detection and/or diagnosis of SARS-CoV-2 by FDA under an Emergency Use Authorization (EUA). This EUA will remain   in effect (meaning this test can be used) for the duration of the COVID-19 declaration under Section 564(b)(1) of the Act, 21 U.S.C. section 360bbb-3(b)(1), unless the authorization is terminated or revoked sooner.    Influenza A by PCR NEGATIVE NEGATIVE Final   Influenza B by PCR NEGATIVE NEGATIVE Final    Comment: (NOTE) The Xpert Xpress SARS-CoV-2/FLU/RSV assay is intended as an aid in  the diagnosis of influenza from Nasopharyngeal swab specimens and  should not be used as a sole basis for treatment. Nasal washings and  aspirates are unacceptable for Xpert Xpress SARS-CoV-2/FLU/RSV  testing. Fact Sheet for Patients: PinkCheek.be Fact Sheet for Healthcare Providers: GravelBags.it This test is not yet approved or cleared by the [place] FDA and  has been authorized for detection and/or diagnosis of SARS-CoV-2 by  FDA under an Emergency Use Authorization (EUA). This EUA will remain  in effect (meaning this test can be used) for the duration of the  Covid-19 declaration under Section 564(b)(1) of the Act, 21  U.S.C. section 360bbb-3(b)(1),  unless the authorization is  terminated or revoked. Performed at [facility], [place] [address] [place], [place] [address]   Urine culture     Status: Abnormal   Collection Time: 04/21/19 10:40 AM   Specimen: Urine, Random  Result Value Ref Range Status   Specimen Description URINE, RANDOM  Final   Special Requests   Final    NONE Performed at [facility], [place] [address] [place], [place] [address]    Culture >=100,000 COLONIES/mL CITROBACTER FREUNDII (A)  Final   Report Status 04/23/2019 FINAL  Final   Organism ID, Bacteria CITROBACTER FREUNDII (A)  Final      Susceptibility   Citrobacter freundii - MIC*    CEFAZOLIN >=64 RESISTANT Resistant     CEFTRIAXONE <=0.25 SENSITIVE Sensitive     CIPROFLOXACIN <=0.25 SENSITIVE Sensitive     GENTAMICIN <=1 SENSITIVE  Sensitive     IMIPENEM <=0.25 SENSITIVE Sensitive     NITROFURANTOIN <=16 SENSITIVE Sensitive     TRIMETH/SULFA <=20 SENSITIVE Sensitive     PIP/TAZO <=4 SENSITIVE Sensitive     * >=100,000 COLONIES/mL CITROBACTER FREUNDII  Culture, blood (routine x 2)     Status: None   Collection Time: 04/21/19 11:10 AM   Specimen: BLOOD RIGHT HAND  Result Value Ref Range Status   Specimen Description BLOOD RIGHT HAND  Final   Special Requests   Final    BOTTLES DRAWN AEROBIC AND ANAEROBIC Blood Culture adequate volume   Culture   Final    NO GROWTH 5 DAYS Performed at [facility], [address] [place], [place] [address]    Report Status 04/26/2019 FINAL  Final  Culture, blood (routine x 2)     Status: None   Collection Time: 04/21/19 11:15 AM   Specimen: BLOOD LEFT HAND  Result Value Ref Range Status   Specimen Description BLOOD LEFT HAND  Final   Special Requests AEROBIC BOTTLE ONLY Blood Culture adequate volume  Final   Culture   Final    NO GROWTH 5 DAYS Performed at [facility], [place] [address] [place], [place] [address]    Report Status 04/26/2019 FINAL  Final  Anaerobic culture     Status: None   Collection Time: 04/22/19 11:11 AM   Specimen: PATH Cytology CSF; Cerebrospinal Fluid  Result Value Ref Range Status   Specimen Description CSF  Final   Special Requests NONE  Final   Culture   Final    NO ANAEROBES ISOLATED Performed at [facility], [place] [address] [place], [place] [address]    Report Status 04/27/2019 FINAL  Final  CSF culture     Status: None   Collection Time: 04/22/19 11:11 AM   Specimen: PATH Cytology CSF; Cerebrospinal Fluid  Result Value Ref Range Status   Specimen Description CSF  Final   Special Requests NONE  Final   Gram Stain   Final    WBC PRESENT,BOTH PMN AND MONONUCLEAR NO ORGANISMS SEEN CYTOSPIN SMEAR    Culture   Final    NO GROWTH 3 DAYS Performed at [facility], [place] [address], [place], [place] [address]     Report Status 04/25/2019 FINAL  Final  Fungus Culture With Stain     Status: None (Preliminary result)   Collection Time: 04/22/19 11:11 AM   Specimen: PATH Cytology CSF; Cerebrospinal Fluid  Result Value Ref Range Status   Fungus Stain Final report  Final  Comment: (NOTE) Performed At: [facility] [place], [place] [id number] Rush Farmer MD [id number]    Fungus (Mycology) Culture PENDING  Incomplete   Fungal Source CSF  Final  Fungus Culture Result     Status: None   Collection Time: 04/22/19 11:11 AM  Result Value Ref Range Status   Result 1 Comment  Final    Comment: (NOTE) KOH/Calcofluor preparation:  no fungus observed. Performed At: [facility] [place], [place] [id number] Rush Farmer MD [id number]       Radiology Studies: DG Foot 2 Views Left  Result Date: 04/27/2019 CLINICAL DATA:  Left foot pain and swelling. EXAM: LEFT FOOT - 2 VIEW COMPARISON:  None. FINDINGS: There is no evidence of fracture or dislocation. There is no evidence of arthropathy or other focal bone abnormality. Soft tissues are unremarkable. IMPRESSION: Negative. Electronically Signed   By: Marijo Conception M.D.   On: 04/27/2019 12:57    Scheduled Meds: .  stroke: mapping our early stages of recovery book   Does not apply Once  . amLODipine  5 mg Oral Daily  . aspirin EC  81 mg Oral Daily  . atorvastatin  40 mg Oral Daily  . Chlorhexidine Gluconate Cloth  6 each Topical Daily  . enoxaparin (LOVENOX) injection  30 mg Subcutaneous Q24H  . gabapentin  100 mg Oral TID  . insulin aspart  0-15 Units Subcutaneous TID WC  . insulin aspart  0-5 Units Subcutaneous QHS  . insulin aspart  3 Units Subcutaneous TID WC  . insulin glargine  20 Units Subcutaneous BID  . pantoprazole  40 mg Oral QHS  . sodium chloride flush  10-40 mL Intracatheter Q12H   Continuous Infusions: . sodium chloride 100 mL/hr at 04/26/19 1731  . cefTRIAXone (ROCEPHIN)  IV 1 g  (04/26/19 2247)     LOS: 7 days   Time spent: 35 minutes.  Little Ishikawa, DO [facility] [email or web address] 04/27/2019, 1:33 PM

## 2019-04-28 DIAGNOSIS — I6389 Other cerebral infarction: Secondary | ICD-10-CM

## 2019-04-28 LAB — GLUCOSE, CAPILLARY
Glucose-Capillary: 186 mg/dL — ABNORMAL HIGH (ref 70–99)
Glucose-Capillary: 152 mg/dL — ABNORMAL HIGH (ref 70–99)

## 2019-04-28 MED ORDER — ASPIRIN 81 MG PO TBEC: 81.0000 mg | DELAYED_RELEASE_TABLET | Freq: Every day | ORAL | 1 refills | Status: DC

## 2019-04-28 MED ORDER — GABAPENTIN 100 MG PO CAPS: 200.0000 mg | ORAL_CAPSULE | Freq: Three times a day (TID) | ORAL | 1 refills | Status: DC

## 2019-04-28 MED ORDER — PANTOPRAZOLE SODIUM 40 MG PO TBEC: 40.0000 mg | DELAYED_RELEASE_TABLET | Freq: Every day | ORAL | 1 refills | Status: DC

## 2019-04-28 MED ORDER — ATORVASTATIN CALCIUM 40 MG PO TABS: 40.0000 mg | ORAL_TABLET | Freq: Every day | ORAL | Status: DC

## 2019-04-28 MED ORDER — INSULIN GLARGINE 100 UNIT/ML ~~LOC~~ SOLN: 20.0000 [IU] | Freq: Two times a day (BID) | SUBCUTANEOUS | 0 refills | Status: DC

## 2019-04-28 MED ORDER — AMLODIPINE BESYLATE 5 MG PO TABS: 5.0000 mg | ORAL_TABLET | Freq: Every day | ORAL | 1 refills | Status: DC

## 2019-04-28 NOTE — TOC Transition Note (Signed)
Transition of Care [facility]) - CM/SW Discharge Note   Patient Details  Name: Miray Mancino MRN: [id number] Date of Birth: 25-Dec-1945  Transition of Care [facility]) CM/SW Contact:  Pollie Friar, RN Phone Number: 04/28/2019, 10:51 AM   Clinical Narrative:    Pt discharging home [facility] with [facility]. Butch Penny with [facility] aware of d/c.  Daughter purchased walker for home and its at the bedside.  Daughter providing transport home.   Final next level of care: Home w Home Health Services Barriers to Discharge: No Barriers Identified   Patient Goals and CMS Choice   CMS Medicare.gov Compare Post Acute Care list provided to:: Patient Represenative (must comment) Choice offered to / list presented to : Adult Children(daughter)  Discharge Placement                       Discharge Plan and Services   Discharge Planning Services: CM Consult Post Acute Care Choice: Home Health, Durable Medical Equipment          DME Arranged: (youth size)   Date DME Agency Contacted: 04/26/19   Representative spoke with at DME Agency: [place]: PT, OT [place] Agency: [place] ([place]) Date [place]: 04/26/19   Representative spoke with at [place]: [place] ([place]) Interventions     Readmission Risk Interventions No flowsheet data found.

## 2019-04-28 NOTE — Discharge Summary (Signed)
Physician Discharge Summary  Nichole Munoz PFY:[id number] DOB: 1945/12/24 DOA: 04/20/2019  PCP: [facility]  Admit date: 04/20/2019 Discharge date: 04/28/2019  Admitted From: Home Disposition: Home  Recommendations for Outpatient Follow-up:  1. Follow up with PCP in 1-2 weeks 2. Please obtain BMP/CBC in one week  Home Health: Yes Equipment/Devices: None  Discharge Condition: Stable CODE STATUS: Full Diet recommendation: As tolerated  Brief/Interim Summary: Nichole Munoz is a 74 y.o. female with a history of IDT2DM, asthma, HTN who presented to the ED unresponsive with leftward gaze deviation and aphasia triaged as code stroke and given tPA after negative head CT. She was also severely hyperglycemic and febrile with leukocytosis. Pyuria was noted, CXR revealing LLL consolidation which was not new. IV insulin was given and she was admitted to the ICU. Broad antibiotics and IVF boluses given with subsequent improvement lactic acid and stabilizing vital signs. Due to persistent encephalopathy, lumbar puncture yielded clear fluid with protein 66, 6 WBC, 1 RBC, no organisms on gram stain. MRI revealed subacute infarct and chronic microvascular disease. Continuous EEG has been performed and neurology continues to follow the patient, recommending TEE and loop recorder placement. 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 Further work-up as recommended by neurology was somewhat limited due to patient and family's hesitancy for TEE and loop recorder despite discussion with neurology cardiology and medicine. At this time patient sepsis has resolved, remains afebrile without leukocytosis, diabetes is somewhat more well controlled now than at admission given initial  glucose well above 500 and an A1c of 9.7. At this time patient's labs continue to improve, AKI on CKD 3B appears to be resolving, sepsis has resolved, metabolic encephalopathy has resolved and patient is back to baseline. Lengthy discussion with family about patient's prognosis and discharge, patient initially was having ambulatory dysfunction due to left foot pain but this resolved and was able to ambulate well above that she and her family thought she would do quite well at home with home health and physical therapy. At this time patient is otherwise stable and agreeable for discharge, follow-up closely with PCP in the next 1 to 2 weeks as discussed as well as in the outpatient setting with cardiology and neurology for further evaluation and risk stratification for stroke prevention. Patient was initiated on aspirin as well as high intensity statin for reduction in recurrent CVA, we discussed dietary and lifestyle changes at length with family as well.  Discharge Diagnoses:  Active Problems:   Sepsis secondary to UTI ([place])   Insulin dependent type 2 diabetes mellitus ([place])   Stroke (cerebrum) ([place])   Infection due to Citrobacter    Discharge Instructions  Discharge Instructions    Ambulatory referral to Neurology   Complete by: As directed    Follow up in stroke clinic at [facility] with Dr. Antony Contras If not available, consider , Dr. Bess Harvest, or Dr. Sarina Ill.   Call MD for:  difficulty breathing, headache or visual disturbances   Complete by: As directed    Call MD for:  extreme fatigue   Complete by: As directed    Call MD for:  hives   Complete by: As directed    Call MD for:  persistant dizziness or light-headedness  Complete by: As directed    Call MD for:  persistant nausea and vomiting   Complete by: As directed    Call MD for:  severe uncontrolled pain   Complete by: As directed    Call MD for:  temperature >100.4   Complete by: As directed     Diet - low sodium heart healthy   Complete by: As directed    Increase activity slowly   Complete by: As directed      Allergies as of 04/28/2019      Reactions   Other Other (See Comments)   Chicken causes itching/  clorox, Bleach causes shortness of breath   No Known Allergies    Previous listing of unable to tolerate SMELL of Clorox and Chicken REACTION NOT AN ALLERGY       Medication List    STOP taking these medications   cetirizine 10 MG tablet Commonly known as: ZYRTEC   glipiZIDE 5 MG tablet Commonly known as: GLUCOTROL   HYDROcodone-acetaminophen 5-325 MG tablet Commonly known as: NORCO/VICODIN   insulin aspart 100 UNIT/ML FlexPen Commonly known as: NovoLOG FlexPen   insulin glargine 100 UNIT/ML Solostar Pen Commonly known as: Lantus SoloStar Replaced by: insulin glargine 100 UNIT/ML injection   lisinopril 10 MG tablet Commonly known as: ZESTRIL   metFORMIN 850 MG tablet Commonly known as: GLUCOPHAGE   Tradjenta 5 MG Tabs tablet Generic drug: linagliptin     TAKE these medications   albuterol 108 (90 Base) MCG/ACT inhaler Commonly known as: VENTOLIN HFA Inhale 2 puffs into the lungs every 6 (six) hours as needed for wheezing or shortness of breath.   amLODipine 5 MG tablet Commonly known as: NORVASC Take 1 tablet (5 mg total) by mouth daily.   aspirin 81 MG EC tablet Take 1 tablet (81 mg total) by mouth daily.   atorvastatin 40 MG tablet Commonly known as: LIPITOR Take 1 tablet (40 mg total) by mouth daily.   blood glucose meter kit and supplies Dispense based on patient and insurance preference. Use up to four times daily as directed. (FOR ICD-10 E10.9, E11.9).   diclofenac Sodium 1 % Gel Commonly known as: Voltaren Apply 2 g topically 4 (four) times daily. What changed:   when to take this  reasons to take this   gabapentin 100 MG capsule Commonly known as: NEURONTIN Take 2 capsules (200 mg total) by mouth 3 (three) times  daily. What changed:   medication strength  how much to take   insulin glargine 100 UNIT/ML injection Commonly known as: LANTUS Inject 0.2 mLs (20 Units total) into the skin 2 (two) times daily. Replaces: insulin glargine 100 UNIT/ML Solostar Pen   Insulin Pen Needle 31G X 5 MM Misc Use up to 4 times daily with insulin   pantoprazole 40 MG tablet Commonly known as: PROTONIX Take 1 tablet (40 mg total) by mouth at bedtime.            Durable Medical Equipment  (From admission, onward)         Start     Ordered   04/26/19 1043  For home use only DME Walker rolling  Once    Comments: Youth size  Question Answer Comment  Walker: With [place]   Patient needs a walker to treat with the following condition Stroke [facility])      04/26/19 1043         Follow-up Information    Shirley Friar, PA-C Follow up on  06/20/2019.   Specialty: Physician Assistant Why: at 1005 for post hospital follow up/ discuss event monitor results Contact information: [address] [place] [address] [place] Follow up.   Why: The home health agency will contact you for the first home visit. Contact information: [phone]       [facility] Follow up in 4 week(s).   Specialty: Neurology Why: stroke clinic. office will call with appt date and time  Contact information: [place] [address] [phone]         Allergies  Allergen Reactions  . Other Other (See Comments)    Chicken causes itching/  clorox, Bleach causes shortness of breath  . No Known Allergies     Previous listing of unable to tolerate SMELL of Clorox and Chicken REACTION NOT AN ALLERGY     Consultations:  Neurology, cardiology   Procedures/Studies: EEG  Result Date: 04/21/2019 Lora Havens, MD     04/21/2019 12:59 PM Patient Name: Nichole Munoz MRN: [id number] Epilepsy Attending:  Lora Havens Referring Physician/Provider: Dr. Antony Contras Date: 04/21/2019 Duration: 23.35 minutes Patient history: 74 year old female presented with altered mental status, left gaze deviation and aphasia.  EEG evaluate for seizures. Level of alertness: lethargic AEDs during EEG study: None Technical aspects: This EEG study was done with scalp electrodes positioned according to the 10-20 International system of electrode placement. Electrical activity was acquired at a sampling rate of 500Hz and reviewed with a high frequency filter of 70Hz and a low frequency filter of 1Hz. EEG data were recorded continuously and digitally stored. DESCRIPTION: No clear posterior dominant rhythm was seen.  EEG showed continuous generalized and lateralized left hemisphere polymorphic 3 to 6 Hz theta-delta slowing.  Sharp transients were also seen in left parieto-occipital region.  Hyperventilation and photic stimulation were not performed. Of note, EEG was technically difficult due to significant myogenic artifact Abnormality -Continuous slow, generalized and lateralized left hemisphere IMPRESSION: This technically difficult study is suggestive of cortical dysfunction in left hemisphere, nonspecific etiology but could be secondary to postictal state. No seizures or definite epileptiform discharges were seen throughout the recording. Consider repeat EEG or long-term EEG if concern for interictal/ictal activity persists. Priyanka Barbra Sarks   CT ANGIO HEAD W OR WO CONTRAST  Result Date: 04/20/2019 CLINICAL DATA:  Aphasia and leftward gaze EXAM: CT ANGIOGRAPHY HEAD AND NECK TECHNIQUE: Multidetector CT imaging of the head and neck was performed using the standard protocol during bolus administration of intravenous contrast. Multiplanar CT image reconstructions and MIPs were obtained to evaluate the vascular anatomy. Carotid stenosis measurements (when applicable) are obtained utilizing NASCET criteria, using the distal internal  carotid diameter as the denominator. CONTRAST:  22m OMNIPAQUE IOHEXOL 350 MG/ML SOLN COMPARISON:  Head CT same day FINDINGS: CTA NECK FINDINGS SKELETON: There is no bony spinal canal stenosis. No lytic or blastic lesion. OTHER NECK: Normal pharynx, larynx and major salivary glands. No cervical lymphadenopathy. Unremarkable thyroid gland. UPPER CHEST: No pneumothorax or pleural effusion. No nodules or masses. AORTIC ARCH: There is moderate calcific atherosclerosis of the aortic arch. There is no aneurysm, dissection or hemodynamically significant stenosis of the visualized portion of the aorta. Conventional 3 vessel aortic branching pattern. The visualized proximal subclavian arteries are widely patent. RIGHT CAROTID SYSTEM: No dissection, occlusion or aneurysm. Mild atherosclerotic calcification at the carotid bifurcation without hemodynamically significant stenosis. LEFT CAROTID SYSTEM: No dissection, occlusion  or aneurysm. Mild atherosclerotic calcification at the carotid bifurcation without hemodynamically significant stenosis. VERTEBRAL ARTERIES: Left dominant configuration. Both origins are clearly patent. There is no dissection, occlusion or flow-limiting stenosis to the skull base (V1-V3 segments). CTA HEAD FINDINGS POSTERIOR CIRCULATION: --Vertebral arteries: Mild calcification of the V4 segments --Posterior inferior cerebellar arteries (PICA): Patent origins from the vertebral arteries. --Anterior inferior cerebellar arteries (AICA): Patent origins from the basilar artery. --Basilar artery: Diminutive basilar artery. --Superior cerebellar arteries: Normal. --Posterior cerebral arteries: Normal. Both are predominantly supplied by the posterior communicating arteries (p-comm). ANTERIOR CIRCULATION: --Intracranial internal carotid arteries: Normal. --Anterior cerebral arteries (ACA): Normal. Absent left A1 segment, normal variant --Middle cerebral arteries (MCA): Normal. VENOUS SINUSES: As permitted by  contrast timing, patent. ANATOMIC VARIANTS: Fetal origins of both posterior cerebral arteries. Review of the MIP images confirms the above findings. IMPRESSION: No emergent large vessel occlusion or high-grade stenosis of the intracranial arteries. Aortic atherosclerosis (ICD10-I70.0). Electronically Signed   By: Ulyses Jarred M.D.   On: 04/20/2019 23:41   CT ANGIO NECK W OR WO CONTRAST  Result Date: 04/20/2019 CLINICAL DATA:  Aphasia and leftward gaze EXAM: CT ANGIOGRAPHY HEAD AND NECK TECHNIQUE: Multidetector CT imaging of the head and neck was performed using the standard protocol during bolus administration of intravenous contrast. Multiplanar CT image reconstructions and MIPs were obtained to evaluate the vascular anatomy. Carotid stenosis measurements (when applicable) are obtained utilizing NASCET criteria, using the distal internal carotid diameter as the denominator. CONTRAST:  44m OMNIPAQUE IOHEXOL 350 MG/ML SOLN COMPARISON:  Head CT same day FINDINGS: CTA NECK FINDINGS SKELETON: There is no bony spinal canal stenosis. No lytic or blastic lesion. OTHER NECK: Normal pharynx, larynx and major salivary glands. No cervical lymphadenopathy. Unremarkable thyroid gland. UPPER CHEST: No pneumothorax or pleural effusion. No nodules or masses. AORTIC ARCH: There is moderate calcific atherosclerosis of the aortic arch. There is no aneurysm, dissection or hemodynamically significant stenosis of the visualized portion of the aorta. Conventional 3 vessel aortic branching pattern. The visualized proximal subclavian arteries are widely patent. RIGHT CAROTID SYSTEM: No dissection, occlusion or aneurysm. Mild atherosclerotic calcification at the carotid bifurcation without hemodynamically significant stenosis. LEFT CAROTID SYSTEM: No dissection, occlusion or aneurysm. Mild atherosclerotic calcification at the carotid bifurcation without hemodynamically significant stenosis. VERTEBRAL ARTERIES: Left dominant  configuration. Both origins are clearly patent. There is no dissection, occlusion or flow-limiting stenosis to the skull base (V1-V3 segments). CTA HEAD FINDINGS POSTERIOR CIRCULATION: --Vertebral arteries: Mild calcification of the V4 segments --Posterior inferior cerebellar arteries (PICA): Patent origins from the vertebral arteries. --Anterior inferior cerebellar arteries (AICA): Patent origins from the basilar artery. --Basilar artery: Diminutive basilar artery. --Superior cerebellar arteries: Normal. --Posterior cerebral arteries: Normal. Both are predominantly supplied by the posterior communicating arteries (p-comm). ANTERIOR CIRCULATION: --Intracranial internal carotid arteries: Normal. --Anterior cerebral arteries (ACA): Normal. Absent left A1 segment, normal variant --Middle cerebral arteries (MCA): Normal. VENOUS SINUSES: As permitted by contrast timing, patent. ANATOMIC VARIANTS: Fetal origins of both posterior cerebral arteries. Review of the MIP images confirms the above findings. IMPRESSION: No emergent large vessel occlusion or high-grade stenosis of the intracranial arteries. Aortic atherosclerosis (ICD10-I70.0). Electronically Signed   By: KUlyses JarredM.D.   On: 04/20/2019 23:41   MR BRAIN WO CONTRAST  Result Date: 04/21/2019 CLINICAL DATA:  Stroke follow-up. Status post tPA. EXAM: MRI HEAD WITHOUT CONTRAST TECHNIQUE: Multiplanar, multiecho pulse sequences of the brain and surrounding structures were obtained without intravenous contrast. COMPARISON:  Brain MRI 06/29/2014 CTA head 04/20/2018 FINDINGS:  Brain: Small focus hyperintensity on diffusion-weighted imaging within medial left temporal lobe without definite correlate ADC map. No other diffusion abnormality. There is an old left occipital lobe infarct. There is multifocal periventricular white matter hyperintensity, most often a result of chronic microvascular ischemia. There is generalized atrophy without lobar predilection. No chronic  microhemorrhage. Incidentally noted cavum septum pellucidum et vergae Vascular: Normal flow voids. Skull and upper cervical spine: Normal marrow signal. Sinuses/Orbits: Bilateral mastoid fluid. Paranasal sinuses are clear. Normal orbits. Other: None IMPRESSION: 1. Small subacute infarct of the medial left temporal lobe. 2. No hemorrhage or mass effect. 3. 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 Electronically Signed   By: Ulyses Jarred M.D.   On: 04/21/2019 22:24   DG CHEST PORT 1 VIEW  Result Date: 04/21/2019 CLINICAL DATA:  Stroke symptoms, lethargy, emesis EXAM: PORTABLE CHEST 1 VIEW COMPARISON:  02/19/2019 FINDINGS: Single frontal view of the chest demonstrates a stable cardiac silhouette. Continued retrocardiac consolidation unchanged. No new airspace disease, effusion, or pneumothorax. No acute bony abnormality. IMPRESSION: 1. Persistent left lower lobe consolidation likely scarring. 2. No acute intrathoracic process. Electronically Signed   By: Randa Ngo M.D.   On: 04/21/2019 02:37   DG Foot 2 Views Left  Result Date: 04/27/2019 CLINICAL DATA:  Left foot pain and swelling. EXAM: LEFT FOOT - 2 VIEW COMPARISON:  None. FINDINGS: There is no evidence of fracture or dislocation. There is no evidence of arthropathy or other focal bone abnormality. Soft tissues are unremarkable. IMPRESSION: Negative. Electronically Signed   By: Marijo Conception M.D.   On: 04/27/2019 12:57   Overnight EEG with video  Result Date: 04/23/2019 Lora Havens, MD     04/24/2019  9:16 AM Patient Name: Nichole Munoz MRN: [id number] Epilepsy Attending: Lora Havens Referring Physician/Provider: Dr. Antony Contras Duration: 04/22/2019 0800 to 04/23/2019 0935  Patient history: 74 year old female presented with altered mental status, left gaze deviation and aphasia.  EEG evaluate for seizures.  Level of alertness: awake, asleep  AEDs during EEG study: None  Technical aspects: This EEG  study was done with scalp electrodes positioned according to the 10-20 International system of electrode placement. Electrical activity was acquired at a sampling rate of 500Hz and reviewed with a high frequency filter of 70Hz and a low frequency filter of 1Hz. EEG data were recorded continuously and digitally stored. DESCRIPTION: The posterior dominant rhythm consists of 8.5 Hz activity of moderate voltage (25-35 uV) seen predominantly in posterior head regions, asymmetric ( left <right) and reactive to eye opening and eye closing. Sleep was characterized by vertex waves, sleep spindles (12-14Hz), maximal frontocentral.  EEG showed intermittent 3 to 6 Hz theta-delta slowing in left posterior temporal region. Hyperventilation and photic stimulation were not performed. Of note, after around 2300 on 04/22/2019, significant electrode artifact was noted and therefore EEG was difficult to interpret.  Abnormality - Background asymmetry, left<right - Intermittent slow, left posterior temporal region  IMPRESSION: This technically difficult study is suggestive of cortical dysfunction in left posterior temporal region, nonspecific etiology but most likely secondary to underlying stroke. No seizures or epileptiform discharges were seen throughout the recording.  Lora Havens   ECHOCARDIOGRAM COMPLETE  Result Date: 04/21/2019    ECHOCARDIOGRAM REPORT   Patient Name:   Nichole Munoz  Date of Exam: 04/21/2019 Medical Rec #:  [id number]  Height:       63.0 in Accession #:    [id number] Weight:  124.6 lb Date of Birth:  May 09, 1945   BSA:          1.581 m Patient Age:    55 years   BP:           154/71 mmHg Patient Gender: F          HR:           118 bpm. Exam Location:  Inpatient Procedure: 2D Echo Indications:    Stroke 434.91 / I163.9  History:        Patient has prior history of Echocardiogram examinations, most                 recent 06/13/2015. Signs/Symptoms:Chest Pain; Risk                 Factors:Diabetes. CKD.   Sonographer:    Vikki Ports Turrentine Referring Phys: [phone] ERIC LINDZEN  Sonographer Comments: Technically difficult study due to poor patient compliance. IMPRESSIONS  1. Left ventricular ejection fraction, by estimation, is 65 to 70%. The left ventricle has normal function. The left ventricle has no regional wall motion abnormalities. There is mild concentric left ventricular hypertrophy. Left ventricular diastolic parameters are consistent with Grade I diastolic dysfunction (impaired relaxation). Elevated left atrial pressure.  2. Right ventricular systolic function is normal. The right ventricular size is normal.  3. The mitral valve is normal in structure. Mild mitral valve regurgitation. No evidence of mitral stenosis.  4. The aortic valve is normal in structure. Aortic valve regurgitation is not visualized. No aortic stenosis is present.  5. The inferior vena cava is normal in size with greater than 50% respiratory variability, suggesting right atrial pressure of 3 mmHg. Conclusion(s)/Recommendation(s): No intracardiac source of embolism detected on this transthoracic study. A transesophageal echocardiogram is recommended to exclude cardiac source of embolism if clinically indicated. FINDINGS  Left Ventricle: Left ventricular ejection fraction, by estimation, is 65 to 70%. The left ventricle has normal function. The left ventricle has no regional wall motion abnormalities. The left ventricular internal cavity size was normal in size. There is  mild concentric left ventricular hypertrophy. Left ventricular diastolic parameters are consistent with Grade I diastolic dysfunction (impaired relaxation). Elevated left atrial pressure. Right Ventricle: The right ventricular size is normal. No increase in right ventricular wall thickness. Right ventricular systolic function is normal. Left Atrium: Left atrial size was normal in size. Right Atrium: Right atrial size was normal in size. Pericardium: There is no evidence of  pericardial effusion. Mitral Valve: The mitral valve is normal in structure. Normal mobility of the mitral valve leaflets. Mild mitral valve regurgitation. No evidence of mitral valve stenosis. Tricuspid Valve: The tricuspid valve is normal in structure. Tricuspid valve regurgitation is mild . No evidence of tricuspid stenosis. Aortic Valve: The aortic valve is normal in structure. Aortic valve regurgitation is not visualized. No aortic stenosis is present. Aortic valve mean gradient measures 11.0 mmHg. Aortic valve peak gradient measures 19.5 mmHg. Aortic valve area, by VTI measures 1.30 cm. Pulmonic Valve: The pulmonic valve was normal in structure. Pulmonic valve regurgitation is not visualized. No evidence of pulmonic stenosis. Aorta: The aortic root is normal in size and structure. Venous: The inferior vena cava is normal in size with greater than 50% respiratory variability, suggesting right atrial pressure of 3 mmHg. IAS/Shunts: No atrial level shunt detected by color flow Doppler.  LEFT VENTRICLE PLAX 2D LVIDd:         4.20 cm  Diastology LVIDs:  2.60 cm  LV e' lateral:   7.51 cm/s LV PW:         1.10 cm  LV E/e' lateral: 10.9 LV IVS:        1.10 cm  LV e' medial:    4.90 cm/s LVOT diam:     1.80 cm  LV E/e' medial:  16.6 LV SV:         45 LV SV Index:   28 LVOT Area:     2.54 cm  RIGHT VENTRICLE RV S prime:     21.90 cm/s TAPSE (M-mode): 1.2 cm LEFT ATRIUM             Index       RIGHT ATRIUM           Index LA diam:        3.30 cm 2.09 cm/m  RA Area:     12.20 cm LA Vol (A2C):   26.3 ml 16.63 ml/m RA Volume:   23.10 ml  14.61 ml/m LA Vol (A4C):   25.8 ml 16.32 ml/m LA Biplane Vol: 27.5 ml 17.39 ml/m  AORTIC VALVE AV Area (Vmax):    1.37 cm AV Area (Vmean):   1.39 cm AV Area (VTI):     1.30 cm AV Vmax:           221.00 cm/s AV Vmean:          158.000 cm/s AV VTI:            0.345 m AV Peak Grad:      19.5 mmHg AV Mean Grad:      11.0 mmHg LVOT Vmax:         119.00 cm/s LVOT Vmean:         86.500 cm/s LVOT VTI:          0.176 m LVOT/AV VTI ratio: 0.51  AORTA Ao Root diam: 2.80 cm MITRAL VALVE MV Area (PHT): 4.71 cm     SHUNTS MV Decel Time: 161 msec     Systemic VTI:  0.18 m MV E velocity: 81.50 cm/s   Systemic Diam: 1.80 cm MV A velocity: 130.00 cm/s MV E/A ratio:  0.63 Ena Dawley MD Electronically signed by Ena Dawley MD Signature Date/Time: 04/21/2019/8:02:22 PM    Final    CT HEAD CODE STROKE WO CONTRAST  Result Date: 04/20/2019 CLINICAL DATA:  Code stroke.  Aphasia and left gaze EXAM: CT HEAD WITHOUT CONTRAST TECHNIQUE: Contiguous axial images were obtained from the base of the skull through the vertex without intravenous contrast. COMPARISON:  01/24/2017 FINDINGS: Brain: Imaging of the pons and posterior fossa is degraded by motion. there is no mass, hemorrhage or extra-axial collection. The size and configuration of the ventricles and extra-axial CSF spaces are normal. There is hypoattenuation of the periventricular white matter, most commonly indicating chronic ischemic microangiopathy. Vascular: No abnormal hyperdensity of the major intracranial arteries or dural venous sinuses. No intracranial atherosclerosis. Skull: The visualized skull base, calvarium and extracranial soft tissues are normal. Sinuses/Orbits: No fluid levels or advanced mucosal thickening of the visualized paranasal sinuses. No mastoid or middle ear effusion. The orbits are normal. ASPECTS [facility] Stroke Program Early CT Score) - Ganglionic level infarction (caudate, lentiform nuclei, internal capsule, insula, M1-M3 cortex): 7 - Supraganglionic infarction (M4-M6 cortex): 3 Total score (0-10 with 10 being normal): 10 IMPRESSION: 1. Motion degraded study without acute abnormality. 2. ASPECTS is 10. These results were communicated to Dr. Kerney Elbe at 10:11 pm on 04/20/2019  by text page via the [facility] messaging system. Electronically Signed   By: Ulyses Jarred M.D.   On: 04/20/2019 22:11   VAS [place] LOWER  EXTREMITY VENOUS (DVT)  Result Date: 04/24/2019  Lower Venous DVTStudy Indications: Stroke.  Comparison Study: No prior study on file for comparison Performing Technologist: Sharion Dove RVS  Examination Guidelines: A complete evaluation includes B-mode imaging, spectral Doppler, color Doppler, and power Doppler as needed of all accessible portions of each vessel. Bilateral testing is considered an integral part of a complete examination. Limited examinations for reoccurring indications may be performed as noted. The reflux portion of the exam is performed with the patient in reverse Trendelenburg.  +---------+---------------+---------+-----------+----------+--------------+ RIGHT    CompressibilityPhasicitySpontaneityPropertiesThrombus Aging +---------+---------------+---------+-----------+----------+--------------+ CFV      Full           Yes      Yes                                 +---------+---------------+---------+-----------+----------+--------------+ SFJ      Full                                                        +---------+---------------+---------+-----------+----------+--------------+ FV Prox  Full                                                        +---------+---------------+---------+-----------+----------+--------------+ FV Mid   Full                                                        +---------+---------------+---------+-----------+----------+--------------+ FV DistalFull                                                        +---------+---------------+---------+-----------+----------+--------------+ PFV      Full                                                        +---------+---------------+---------+-----------+----------+--------------+ POP      Full           Yes      Yes                                 +---------+---------------+---------+-----------+----------+--------------+ PTV      Full                                                         +---------+---------------+---------+-----------+----------+--------------+  PERO     Full                                                        +---------+---------------+---------+-----------+----------+--------------+   +---------+---------------+---------+-----------+----------+--------------+ LEFT     CompressibilityPhasicitySpontaneityPropertiesThrombus Aging +---------+---------------+---------+-----------+----------+--------------+ CFV      Full           Yes      Yes                                 +---------+---------------+---------+-----------+----------+--------------+ SFJ      Full                                                        +---------+---------------+---------+-----------+----------+--------------+ FV Prox  Full                                                        +---------+---------------+---------+-----------+----------+--------------+ FV Mid   Full                                                        +---------+---------------+---------+-----------+----------+--------------+ FV DistalFull                                                        +---------+---------------+---------+-----------+----------+--------------+ PFV      Full                                                        +---------+---------------+---------+-----------+----------+--------------+ POP      Full           Yes      Yes                                 +---------+---------------+---------+-----------+----------+--------------+ PTV      Full                                                        +---------+---------------+---------+-----------+----------+--------------+ PERO     Full                                                        +---------+---------------+---------+-----------+----------+--------------+  Summary: BILATERAL: - No evidence of deep vein thrombosis seen in the lower extremities,  bilaterally.   *See table(s) above for measurements and observations. Electronically signed by Monica Martinez MD on 04/24/2019 at 3:10:14 PM.    Final    DG FL GUIDED LUMBAR PUNCTURE  Result Date: 04/22/2019 CLINICAL DATA:  Fever and leukocytosis. Clinical concern for meningitis. EXAM: DIAGNOSTIC LUMBAR PUNCTURE UNDER FLUOROSCOPIC GUIDANCE FLUOROSCOPY TIME:  Fluoroscopy Time: 6 seconds of low-dose pulsed fluoroscopy Radiation Exposure Index (if provided by the fluoroscopic device): 0.6 mGy Number of Acquired Spot Images: 0 PROCEDURE: Standard time-out was employed. I discussed the risks (including hemorrhage, infection, headache, and nerve damage, among others), benefits, and alternatives to fluoroscopically guided lumbar puncture with the patient's daughter by telephone prior to the procedure. We specifically discussed the high technical likelihood of success of the procedure. The daughter understood and elected to undergo the procedure. An appropriate site for lumbar puncture was marked on the patient's skin under fluoroscopic guidance. Following sterile skin prep and local anesthetic administration consisting of 1 percent lidocaine, a 22 gauge spinal needle was advanced without difficulty into the thecal sac at the at the L2-3 level under fluoroscopic guidance. Clear CSF was returned. Opening pressure was 13 cm measured prone through the needle. 12 cc of clear CSF was collected. The needle was subsequently removed and the skin cleansed and bandaged. No immediate complications were observed. IMPRESSION: Diagnostic lumbar puncture without immediate complications. Electronically Signed   By: Richardean Sale M.D.   On: 04/22/2019 11:30    Subjective: No acute issues or events overnight, foot pain resolved, denies nausea, vomiting, diarrhea, constipation, headache, fevers, chest pain, shortness of breath.   Discharge Exam: Vitals:   04/28/19 0420 04/28/19 0942  BP: 135/77 (!) 127/93  Pulse: 91 97   Resp: 20 19  Temp: 98.9 F (37.2 C) 99 F (37.2 C)  SpO2: 99% 100%   Vitals:   04/27/19 2024 04/28/19 0028 04/28/19 0420 04/28/19 0942  BP: 130/68 128/66 135/77 (!) 127/93  Pulse: 89 93 91 97  Resp: _0 Temp: 99 F (37.2 C) 98.5 F (36.9 C) 98.9 F (37.2 C) 99 F (37.2 C)  TempSrc:    Oral  SpO2: 100% 100% 99% 100%  Weight:      Height:        General:  Pleasantly resting in bed, No acute distress. HEENT:  Normocephalic atraumatic.  Sclerae nonicteric, noninjected.  Extraocular movements intact bilaterally. Neck:  Without mass or deformity.  Trachea is midline. Lungs:  Clear to auscultate bilaterally without rhonchi, wheeze, or rales. Heart:  Regular rate and rhythm.  Without murmurs, rubs, or gallops. Abdomen:  Soft, nontender, nondistended.  Without guarding or rebound. Extremities: Without cyanosis, clubbing, edema, or obvious deformity. Vascular:  Dorsalis pedis and posterior tibial pulses palpable bilaterally. Skin:  Warm and dry, no erythema, no ulcerations.   The results of significant diagnostics from this hospitalization (including imaging, microbiology, ancillary and laboratory) are listed below for reference.     Microbiology: Recent Results (from the past 240 hour(s))  Respiratory Panel by RT PCR (Flu A&B, Covid) - Nasopharyngeal Swab     Status: None   Collection Time: 04/21/19 12:20 AM   Specimen: Nasopharyngeal Swab  Result Value Ref Range Status   SARS Coronavirus 2 by RT PCR NEGATIVE NEGATIVE Final    Comment: (NOTE) SARS-CoV-2 target nucleic acids are NOT DETECTED. The SARS-CoV-2 RNA is generally detectable in upper respiratoy specimens during the acute  phase of infection. The lowest concentration of SARS-CoV-2 viral copies this assay can detect is 131 copies/mL. A negative result does not preclude SARS-Cov-2 infection and should not be used as the sole basis for treatment or other patient management decisions. A negative result may occur  with  improper specimen collection/handling, submission of specimen other than nasopharyngeal swab, presence of viral mutation(s) within the areas targeted by this assay, and inadequate number of viral copies (<131 copies/mL). A negative result must be combined with clinical observations, patient history, and epidemiological information. The expected result is Negative. Fact Sheet for Patients:  PinkCheek.be Fact Sheet for Healthcare Providers:  GravelBags.it This test is not yet ap proved or cleared by the [place] FDA and  has been authorized for detection and/or diagnosis of SARS-CoV-2 by FDA under an Emergency Use Authorization (EUA). This EUA will remain  in effect (meaning this test can be used) for the duration of the COVID-19 declaration under Section 564(b)(1) of the Act, 21 U.S.C. section 360bbb-3(b)(1), unless the authorization is terminated or revoked sooner.    Influenza A by PCR NEGATIVE NEGATIVE Final   Influenza B by PCR NEGATIVE NEGATIVE Final    Comment: (NOTE) The Xpert Xpress SARS-CoV-2/FLU/RSV assay is intended as an aid in  the diagnosis of influenza from Nasopharyngeal swab specimens and  should not be used as a sole basis for treatment. Nasal washings and  aspirates are unacceptable for Xpert Xpress SARS-CoV-2/FLU/RSV  testing. Fact Sheet for Patients: PinkCheek.be Fact Sheet for Healthcare Providers: GravelBags.it This test is not yet approved or cleared by the [place] FDA and  has been authorized for detection and/or diagnosis of SARS-CoV-2 by  FDA under an Emergency Use Authorization (EUA). This EUA will remain  in effect (meaning this test can be used) for the duration of the  Covid-19 declaration under Section 564(b)(1) of the Act, 21  U.S.C. section 360bbb-3(b)(1), unless the authorization is  terminated or revoked. Performed  at [facility], [place] [address], [place], [place] [address]   Urine culture     Status: Abnormal   Collection Time: 04/21/19 10:40 AM   Specimen: Urine, Random  Result Value Ref Range Status   Specimen Description URINE, RANDOM  Final   Special Requests   Final    NONE Performed at [facility], [place] [address], [place], [place] [address]    Culture >=100,000 COLONIES/mL CITROBACTER FREUNDII (A)  Final   Report Status 04/23/2019 FINAL  Final   Organism ID, Bacteria CITROBACTER FREUNDII (A)  Final      Susceptibility   Citrobacter freundii - MIC*    CEFAZOLIN >=64 RESISTANT Resistant     CEFTRIAXONE <=0.25 SENSITIVE Sensitive     CIPROFLOXACIN <=0.25 SENSITIVE Sensitive     GENTAMICIN <=1 SENSITIVE Sensitive     IMIPENEM <=0.25 SENSITIVE Sensitive     NITROFURANTOIN <=16 SENSITIVE Sensitive     TRIMETH/SULFA <=20 SENSITIVE Sensitive     PIP/TAZO <=4 SENSITIVE Sensitive     * >=100,000 COLONIES/mL CITROBACTER FREUNDII  Culture, blood (routine x 2)     Status: None   Collection Time: 04/21/19 11:10 AM   Specimen: BLOOD RIGHT HAND  Result Value Ref Range Status   Specimen Description BLOOD RIGHT HAND  Final   Special Requests   Final    BOTTLES DRAWN AEROBIC AND ANAEROBIC Blood Culture adequate volume   Culture   Final    NO GROWTH 5 DAYS Performed at [facility], [address] [place], [place] [address]    Report Status 04/26/2019 FINAL  Final  Culture, blood (routine x 2)     Status: None   Collection Time: 04/21/19 11:15 AM   Specimen: BLOOD LEFT HAND  Result Value Ref Range Status   Specimen Description BLOOD LEFT HAND  Final   Special Requests AEROBIC BOTTLE ONLY Blood Culture adequate volume  Final   Culture   Final    NO GROWTH 5 DAYS Performed at [facility], [place] [address], [place], [place] [address]    Report Status 04/26/2019 FINAL  Final  Anaerobic culture     Status: None   Collection Time: 04/22/19 11:11 AM   Specimen: PATH  Cytology CSF; Cerebrospinal Fluid  Result Value Ref Range Status   Specimen Description CSF  Final   Special Requests NONE  Final   Culture   Final    NO ANAEROBES ISOLATED Performed at [facility], [place] [address], [place], [place] [address]    Report Status 04/27/2019 FINAL  Final  CSF culture     Status: None   Collection Time: 04/22/19 11:11 AM   Specimen: PATH Cytology CSF; Cerebrospinal Fluid  Result Value Ref Range Status   Specimen Description CSF  Final   Special Requests NONE  Final   Gram Stain   Final    WBC PRESENT,BOTH PMN AND MONONUCLEAR NO ORGANISMS SEEN CYTOSPIN SMEAR    Culture   Final    NO GROWTH 3 DAYS Performed at [facility], [place] [address], [place], [place] [address]    Report Status 04/25/2019 FINAL  Final  Fungus Culture With Stain     Status: None (Preliminary result)   Collection Time: 04/22/19 11:11 AM   Specimen: PATH Cytology CSF; Cerebrospinal Fluid  Result Value Ref Range Status   Fungus Stain Final report  Final    Comment: (NOTE) Performed At: [facility] [place], [place] [id number] Rush Farmer MD [id number]    Fungus (Mycology) Culture PENDING  Incomplete   Fungal Source CSF  Final  Fungus Culture Result     Status: None   Collection Time: 04/22/19 11:11 AM  Result Value Ref Range Status   Result 1 Comment  Final    Comment: (NOTE) KOH/Calcofluor preparation:  no fungus observed. Performed At: [facility] [place], [place] [id number] Rush Farmer MD [id number]      Labs: BNP (last 3 results) No results for input(s): BNP in the last 8760 hours. Basic Metabolic Panel: Recent Labs  Lab 04/23/19 2002 04/24/19 0353 04/25/19 0314 04/26/19 0556 04/27/19 0706  NA 142 141 143 139 139  K 3.5 3.0* 4.3 4.5 4.3  CL 106 108 109 106 108  CO2 _0 GLUCOSE 163* 125* 97 153* 227*  BUN _1 30* 32*  CREATININE 1.24* 1.17* 1.08* 1.45* 1.35*  CALCIUM  8.2* 8.3* 8.4* 8.7* 8.3*   Liver Function Tests: No results for input(s): AST, ALT, ALKPHOS, BILITOT, PROT, ALBUMIN in the last 168 hours. No results for input(s): LIPASE, AMYLASE in the last 168 hours. No results for input(s): AMMONIA in the last 168 hours. CBC: Recent Labs  Lab 04/23/19 0339 04/24/19 0353 04/25/19 0314 04/26/19 0556 04/27/19 0706  WBC 14.3* 9.5 8.2 10.3 9.4  HGB 13.1 12.3 11.8* 11.3* 9.9*  HCT 37.9 35.6* 33.9* 34.7* 29.8*  MCV 87.5 87.3 88.5 89.9 89.8  PLT 204 203 209 237  229   Cardiac Enzymes: No results for input(s): CKTOTAL, CKMB, CKMBINDEX, TROPONINI in the last 168 hours. BNP: Invalid input(s): POCBNP CBG: Recent Labs  Lab 04/27/19 0556 04/27/19 1146 04/27/19 1627 04/27/19 2110 04/28/19 0609  GLUCAP 198* 76 164* 99 186*   D-Dimer No results for input(s): DDIMER in the last 72 hours. Hgb A1c No results for input(s): HGBA1C in the last 72 hours. Lipid Profile No results for input(s): CHOL, HDL, LDLCALC, TRIG, CHOLHDL, LDLDIRECT in the last 72 hours. Thyroid function studies No results for input(s): TSH, T4TOTAL, T3FREE, THYROIDAB in the last 72 hours.  Invalid input(s): FREET3 Anemia work up No results for input(s): VITAMINB12, FOLATE, FERRITIN, TIBC, IRON, RETICCTPCT in the last 72 hours. Urinalysis    Component Value Date/Time   COLORURINE YELLOW 04/21/2019 1844   APPEARANCEUR HAZY (A) 04/21/2019 1844   LABSPEC 1.023 04/21/2019 1844   PHURINE 5.0 04/21/2019 1844   GLUCOSEU 150 (A) 04/21/2019 1844   HGBUR MODERATE (A) 04/21/2019 1844   BILIRUBINUR NEGATIVE 04/21/2019 1844   KETONESUR NEGATIVE 04/21/2019 1844   PROTEINUR >=300 (A) 04/21/2019 1844   UROBILINOGEN 1.0 09/23/2014 1458   NITRITE POSITIVE (A) 04/21/2019 1844   LEUKOCYTESUR MODERATE (A) 04/21/2019 1844   Sepsis Labs Invalid input(s): PROCALCITONIN,  WBC,  LACTICIDVEN Microbiology Recent Results (from the past 240 hour(s))  Respiratory Panel by RT PCR (Flu A&B, Covid) -  Nasopharyngeal Swab     Status: None   Collection Time: 04/21/19 12:20 AM   Specimen: Nasopharyngeal Swab  Result Value Ref Range Status   SARS Coronavirus 2 by RT PCR NEGATIVE NEGATIVE Final    Comment: (NOTE) SARS-CoV-2 target nucleic acids are NOT DETECTED. The SARS-CoV-2 RNA is generally detectable in upper respiratoy specimens during the acute phase of infection. The lowest concentration of SARS-CoV-2 viral copies this assay can detect is 131 copies/mL. A negative result does not preclude SARS-Cov-2 infection and should not be used as the sole basis for treatment or other patient management decisions. A negative result may occur with  improper specimen collection/handling, submission of specimen other than nasopharyngeal swab, presence of viral mutation(s) within the areas targeted by this assay, and inadequate number of viral copies (<131 copies/mL). A negative result must be combined with clinical observations, patient history, and epidemiological information. The expected result is Negative. Fact Sheet for Patients:  PinkCheek.be Fact Sheet for Healthcare Providers:  GravelBags.it This test is not yet ap proved or cleared by the [place] FDA and  has been authorized for detection and/or diagnosis of SARS-CoV-2 by FDA under an Emergency Use Authorization (EUA). This EUA will remain  in effect (meaning this test can be used) for the duration of the COVID-19 declaration under Section 564(b)(1) of the Act, 21 U.S.C. section 360bbb-3(b)(1), unless the authorization is terminated or revoked sooner.    Influenza A by PCR NEGATIVE NEGATIVE Final   Influenza B by PCR NEGATIVE NEGATIVE Final    Comment: (NOTE) The Xpert Xpress SARS-CoV-2/FLU/RSV assay is intended as an aid in  the diagnosis of influenza from Nasopharyngeal swab specimens and  should not be used as a sole basis for treatment. Nasal washings and  aspirates  are unacceptable for Xpert Xpress SARS-CoV-2/FLU/RSV  testing. Fact Sheet for Patients: PinkCheek.be Fact Sheet for Healthcare Providers: GravelBags.it This test is not yet approved or cleared by the [place] FDA and  has been authorized for detection and/or diagnosis of SARS-CoV-2 by  FDA under an Emergency Use Authorization (EUA). This EUA will remain  in effect (meaning this test can be used) for the duration of the  Covid-19 declaration under Section 564(b)(1) of the Act, 21  U.S.C. section 360bbb-3(b)(1), unless the authorization is  terminated or revoked. Performed at [facility], [place] [address], [place], [place] [address]   Urine culture     Status: Abnormal   Collection Time: 04/21/19 10:40 AM   Specimen: Urine, Random  Result Value Ref Range Status   Specimen Description URINE, RANDOM  Final   Special Requests   Final    NONE Performed at [facility], [place] [address], [place], [place] [address]    Culture >=100,000 COLONIES/mL CITROBACTER FREUNDII (A)  Final   Report Status 04/23/2019 FINAL  Final   Organism ID, Bacteria CITROBACTER FREUNDII (A)  Final      Susceptibility   Citrobacter freundii - MIC*    CEFAZOLIN >=64 RESISTANT Resistant     CEFTRIAXONE <=0.25 SENSITIVE Sensitive     CIPROFLOXACIN <=0.25 SENSITIVE Sensitive     GENTAMICIN <=1 SENSITIVE Sensitive     IMIPENEM <=0.25 SENSITIVE Sensitive     NITROFURANTOIN <=16 SENSITIVE Sensitive     TRIMETH/SULFA <=20 SENSITIVE Sensitive     PIP/TAZO <=4 SENSITIVE Sensitive     * >=100,000 COLONIES/mL CITROBACTER FREUNDII  Culture, blood (routine x 2)     Status: None   Collection Time: 04/21/19 11:10 AM   Specimen: BLOOD RIGHT HAND  Result Value Ref Range Status   Specimen Description BLOOD RIGHT HAND  Final   Special Requests   Final    BOTTLES DRAWN AEROBIC AND ANAEROBIC Blood Culture adequate volume   Culture   Final    NO GROWTH 5  DAYS Performed at [facility], [address], [place], [place] [address]    Report Status 04/26/2019 FINAL  Final  Culture, blood (routine x 2)     Status: None   Collection Time: 04/21/19 11:15 AM   Specimen: BLOOD LEFT HAND  Result Value Ref Range Status   Specimen Description BLOOD LEFT HAND  Final   Special Requests AEROBIC BOTTLE ONLY Blood Culture adequate volume  Final   Culture   Final    NO GROWTH 5 DAYS Performed at [facility], [place] [address], [place], [place] [address]    Report Status 04/26/2019 FINAL  Final  Anaerobic culture     Status: None   Collection Time: 04/22/19 11:11 AM   Specimen: PATH Cytology CSF; Cerebrospinal Fluid  Result Value Ref Range Status   Specimen Description CSF  Final   Special Requests NONE  Final   Culture   Final    NO ANAEROBES ISOLATED Performed at [facility], [place] [address] [place], [place] [address]    Report Status 04/27/2019 FINAL  Final  CSF culture     Status: None   Collection Time: 04/22/19 11:11 AM   Specimen: PATH Cytology CSF; Cerebrospinal Fluid  Result Value Ref Range Status   Specimen Description CSF  Final   Special Requests NONE  Final   Gram Stain   Final    WBC PRESENT,BOTH PMN AND MONONUCLEAR NO ORGANISMS SEEN CYTOSPIN SMEAR    Culture   Final    NO GROWTH 3 DAYS Performed at [facility], [place] [address], [place],  [address]    Report Status 04/25/2019 FINAL  Final  Fungus Culture With Stain     Status: None (Preliminary result)   Collection Time: 04/22/19  11:11 AM   Specimen: PATH Cytology CSF; Cerebrospinal Fluid  Result Value Ref Range Status   Fungus Stain Final report  Final    Comment: (NOTE) Performed At: [facility] [place], [place] [id number] Rush Farmer MD [id number]    Fungus (Mycology) Culture PENDING  Incomplete   Fungal Source CSF  Final  Fungus Culture Result     Status: None   Collection Time: 04/22/19 11:11 AM   Result Value Ref Range Status   Result 1 Comment  Final    Comment: (NOTE) KOH/Calcofluor preparation:  no fungus observed. Performed At: [facility] [place], [place] [id number] Rush Farmer MD [id number]      Time coordinating discharge: Over 30 minutes  SIGNED:   Little Ishikawa, DO [facility] 04/28/2019, 9:48 AM Pager   If 7PM-7AM, please contact night-coverage [email or web address]

## 2019-04-28 NOTE — Progress Notes (Signed)
Physical Therapy Treatment Patient Details Name: Nichole Munoz MRN: [id number] DOB: 1945/11/23 Today's Date: 04/28/2019    History of Present Illness Aretta Stetzel is a 74 y/o female from [place] with pmhx significant for DM, neuropathy, HTN, arhtritis admitted after family found her down and nonverbal.  EMS noted L gaze deviation and aphasia.  Code stroke called, pt received tPA.  CT showed no acute brain abnormality.  Pt was hyperglycemic and febrile, so may have been a pseudo-stroke from sepsis given her profound encephalopathy    PT Comments    Patient is making progress toward PT goals and tolerated increased mobility well. Current plan remains appropriate.    Follow Up Recommendations  Home health PT;Supervision/Assistance - 24 hour     Equipment Recommendations  Rolling walker with 5" wheels;Other (comment)(youth size)    Recommendations for Other Services       Precautions / Restrictions Precautions Precautions: Fall Restrictions Weight Bearing Restrictions: No    Mobility  Bed Mobility Overal bed mobility: Modified Independent                Transfers Overall transfer level: Needs assistance Equipment used: Rolling walker (2 wheeled) Transfers: Sit to/from Stand Sit to Stand: Min guard         General transfer comment: min guard for safety; pt stood initially and fell back onto bed; second trial pt able to maintain balance  Ambulation/Gait Ambulation/Gait assistance: Min guard Gait Distance (Feet): 150 Feet Assistive device: Rolling walker (2 wheeled) Gait Pattern/deviations: Step-through pattern;Decreased stride length     General Gait Details: min guard for safety; cues to maintain safe proximity to [employer] Mobility    Modified Rankin (Stroke Patients Only)       Balance Overall balance assessment: Needs assistance Sitting-balance support: Feet supported Sitting balance-Leahy Scale: Good     Standing  balance support: Bilateral upper extremity supported Standing balance-Leahy Scale: Poor                              Cognition Arousal/Alertness: Awake/alert Behavior During Therapy: WFL for tasks assessed/performed Overall Cognitive Status: Difficult to assess                                 General Comments: daughter present and interpreting; pt following cues consistently      Exercises      General Comments        Pertinent Vitals/Pain Pain Assessment: Faces Faces Pain Scale: Hurts a little bit Pain Location: L LE (pt says it is improving) Pain Descriptors / Indicators: Guarding;Sore Pain Intervention(s): Monitored during session    Home Living                      Prior Function            PT Goals (current goals can now be found in the care plan section) Acute Rehab PT Goals Patient Stated Goal: to go home Progress towards PT goals: Progressing toward goals    Frequency    Min 3X/week      PT Plan Current plan remains appropriate    Co-evaluation              AM-PAC PT "6 Clicks" Mobility   Outcome Measure  Help needed turning  from your back to your side while in a flat bed without using bedrails?: None Help needed moving from lying on your back to sitting on the side of a flat bed without using bedrails?: None Help needed moving to and from a bed to a chair (including a wheelchair)?: A Little Help needed standing up from a chair using your arms (e.g., wheelchair or bedside chair)?: A Little Help needed to walk in hospital room?: A Little Help needed climbing 3-5 steps with a railing? : A Little 6 Click Score: 20    End of Session Equipment Utilized During Treatment: Gait belt Activity Tolerance: Patient tolerated treatment well Patient left: in bed;with call bell/phone within reach;with family/visitor present Nurse Communication: Mobility status PT Visit Diagnosis: Unsteadiness on feet (R26.81);Other  symptoms and signs involving the nervous system (R29.898)     Time: 7944-4619 PT Time Calculation (min) (ACUTE ONLY): 15 min  Charges:  $Gait Training: 8-22 mins                     Earney Navy, PTA Acute Rehabilitation Services Pager: [phone] Office: [phone]     Darliss Cheney 04/28/2019, 5:21 PM

## 2019-04-28 NOTE — Progress Notes (Signed)
Patient's IV removed without complication. AVS discharge documentation reviewed with patient and daughter. Patient escorted to personal vehicle via wheelchair driven by daughter.

## 2019-05-20 LAB — FUNGUS CULTURE WITH STAIN

## 2019-05-20 LAB — FUNGUS CULTURE RESULT

## 2019-05-20 LAB — FUNGAL ORGANISM REFLEX

## 2019-06-08 ENCOUNTER — Telehealth: Payer: Self-pay

## 2019-06-08 NOTE — Telephone Encounter (Signed)
Received notification from [employer] that the pt has requested cancellation of monitor... Order will be cancelled.
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
## 2019-06-20 ENCOUNTER — Ambulatory Visit: Payer: Medicare Other | Admitting: Student

## 2019-06-21 ENCOUNTER — Telehealth: Payer: Self-pay | Admitting: Internal Medicine

## 2019-06-21 NOTE — Telephone Encounter (Signed)
There is a phone note from earlier this month saying that they (the patient) cancelled the monitor, so I do not believe there will be any retrievable data from that monitor.   Since they have not undergone monitoring, they can call back if they change their mind about monitoring (loop recorder), otherwise can just continue follow up with neurology and PCP.   Legrand Como [address] [place], PA-C  06/21/2019 1:30 PM

## 2019-06-21 NOTE — Telephone Encounter (Signed)
Called patient's daughter, Irianna, to reschedule appointment with "no show" status. Patient's daughter returned call, stating she is the patient's transportation and she completely forgot about the appointment due to working 3rd shift. Valentina states the patient has not been wearing her monitor due to having issues with adhesive. Patient's daughter is questioning whether or not rescheduling the appointment is still necessary. Please return call to discuss.

## 2019-06-22 NOTE — Telephone Encounter (Signed)
Left detailed message explaining that being that we have no data for the monitor and no decision made on implanting the loop recorder patient is advised to follow up with Neuro and PCP. If patient wants to proceed with Loop recorder implant she will need to contact office to arrange.

## 2019-08-02 ENCOUNTER — Other Ambulatory Visit: Payer: Self-pay

## 2019-08-02 NOTE — Patient Outreach (Signed)
[facility]) Care Management  08/02/2019  Tanji Storrs June 29, 1945 [id number]   First telephone outreach attempt to obtain mRS. No answer. Left message for returned call.  CMA also called interpretation line twice [phone]). Both attempts were disconnected without successfully getting interpretor.   Ina Homes [facility] Management Assistant [phone]

## 2019-08-05 ENCOUNTER — Other Ambulatory Visit: Payer: Self-pay

## 2019-08-05 NOTE — Patient Outreach (Signed)
[facility]) Care Management  08/05/2019  Deondra Labrador 1945-02-03 [id number]  Second telephone outreach attempt to obtain mRs. No answer. Interpreter left message for returned call on mobile as home phone number states not in service.  Ina Homes [facility] Management Assistant [phone]

## 2019-08-09 ENCOUNTER — Other Ambulatory Visit: Payer: Self-pay

## 2019-08-09 NOTE — Patient Outreach (Signed)
[facility]) Care Management  08/09/2019  Nichole Munoz 12-14-1945 [id number]  3 outreach attempts were completed to obtain mRs. mRs could not be obtained because patient never returned my calls. mRs=7  Interpreter left message for returned call on mobile as home phone number states not in service.  [place] Management Assistant [phone]

## 2019-09-04 ENCOUNTER — Encounter (HOSPITAL_COMMUNITY): Payer: Self-pay | Admitting: Emergency Medicine

## 2019-09-04 ENCOUNTER — Other Ambulatory Visit: Payer: Self-pay

## 2019-09-04 ENCOUNTER — Emergency Department (HOSPITAL_COMMUNITY): Payer: Medicare Other

## 2019-09-04 ENCOUNTER — Inpatient Hospital Stay (HOSPITAL_COMMUNITY)
Admission: EM | Admit: 2019-09-04 | Discharge: 2019-09-08 | DRG: 690 | Disposition: A | Discharge status: Home / Self Care | Payer: Medicare Other | Attending: Student | Admitting: Student

## 2019-09-04 DIAGNOSIS — I129 Hypertensive chronic kidney disease with stage 1 through stage 4 chronic kidney disease, or unspecified chronic kidney disease: Secondary | ICD-10-CM | POA: Diagnosis present

## 2019-09-04 DIAGNOSIS — N136 Pyonephrosis: Principal | ICD-10-CM | POA: Diagnosis present

## 2019-09-04 DIAGNOSIS — E1140 Type 2 diabetes mellitus with diabetic neuropathy, unspecified: Secondary | ICD-10-CM | POA: Diagnosis present

## 2019-09-04 DIAGNOSIS — Z794 Long term (current) use of insulin: Secondary | ICD-10-CM

## 2019-09-04 DIAGNOSIS — N390 Urinary tract infection, site not specified: Secondary | ICD-10-CM | POA: Diagnosis not present

## 2019-09-04 DIAGNOSIS — E119 Type 2 diabetes mellitus without complications: Secondary | ICD-10-CM | POA: Diagnosis present

## 2019-09-04 DIAGNOSIS — E1165 Type 2 diabetes mellitus with hyperglycemia: Secondary | ICD-10-CM | POA: Diagnosis present

## 2019-09-04 DIAGNOSIS — N10 Acute tubulo-interstitial nephritis: Secondary | ICD-10-CM | POA: Diagnosis present

## 2019-09-04 DIAGNOSIS — IMO0002 Reserved for concepts with insufficient information to code with codable children: Secondary | ICD-10-CM | POA: Diagnosis present

## 2019-09-04 DIAGNOSIS — Z789 Other specified health status: Secondary | ICD-10-CM | POA: Diagnosis not present

## 2019-09-04 DIAGNOSIS — N12 Tubulo-interstitial nephritis, not specified as acute or chronic: Secondary | ICD-10-CM | POA: Diagnosis not present

## 2019-09-04 DIAGNOSIS — Z7982 Long term (current) use of aspirin: Secondary | ICD-10-CM

## 2019-09-04 DIAGNOSIS — N1330 Unspecified hydronephrosis: Secondary | ICD-10-CM | POA: Diagnosis present

## 2019-09-04 DIAGNOSIS — E785 Hyperlipidemia, unspecified: Secondary | ICD-10-CM | POA: Diagnosis present

## 2019-09-04 DIAGNOSIS — B9620 Unspecified Escherichia coli [E. coli] as the cause of diseases classified elsewhere: Secondary | ICD-10-CM | POA: Diagnosis present

## 2019-09-04 DIAGNOSIS — I10 Essential (primary) hypertension: Secondary | ICD-10-CM | POA: Diagnosis not present

## 2019-09-04 DIAGNOSIS — Z20822 Contact with and (suspected) exposure to covid-19: Secondary | ICD-10-CM | POA: Diagnosis present

## 2019-09-04 DIAGNOSIS — N179 Acute kidney failure, unspecified: Secondary | ICD-10-CM

## 2019-09-04 DIAGNOSIS — E860 Dehydration: Secondary | ICD-10-CM | POA: Diagnosis present

## 2019-09-04 DIAGNOSIS — N1831 Chronic kidney disease, stage 3a: Secondary | ICD-10-CM | POA: Diagnosis present

## 2019-09-04 DIAGNOSIS — E876 Hypokalemia: Secondary | ICD-10-CM | POA: Diagnosis present

## 2019-09-04 DIAGNOSIS — R310 Gross hematuria: Secondary | ICD-10-CM | POA: Diagnosis present

## 2019-09-04 DIAGNOSIS — E11649 Type 2 diabetes mellitus with hypoglycemia without coma: Secondary | ICD-10-CM

## 2019-09-04 DIAGNOSIS — J45909 Unspecified asthma, uncomplicated: Secondary | ICD-10-CM | POA: Diagnosis present

## 2019-09-04 DIAGNOSIS — R739 Hyperglycemia, unspecified: Secondary | ICD-10-CM | POA: Diagnosis not present

## 2019-09-04 DIAGNOSIS — Z79899 Other long term (current) drug therapy: Secondary | ICD-10-CM | POA: Diagnosis not present

## 2019-09-04 DIAGNOSIS — N189 Chronic kidney disease, unspecified: Secondary | ICD-10-CM | POA: Diagnosis not present

## 2019-09-04 DIAGNOSIS — E1122 Type 2 diabetes mellitus with diabetic chronic kidney disease: Secondary | ICD-10-CM | POA: Diagnosis present

## 2019-09-04 LAB — URINALYSIS, ROUTINE W REFLEX MICROSCOPIC
Bilirubin Urine: NEGATIVE
Glucose, UA: >=500 mg/dL — AB
Ketones, ur: NEGATIVE mg/dL
Nitrite: NEGATIVE
Protein, ur: >=300 mg/dL — AB
RBC / HPF: >50 RBC/hpf — ABNORMAL HIGH (ref 0–5)
Specific Gravity, Urine: 1.014 (ref 1.005–1.030)
WBC, UA: >50 WBC/hpf — ABNORMAL HIGH (ref 0–5)
pH: 6 (ref 5.0–8.0)

## 2019-09-04 LAB — COMPREHENSIVE METABOLIC PANEL
ALT: 15 U/L (ref 0–44)
AST: 24 U/L (ref 15–41)
Albumin: 3.8 g/dL (ref 3.5–5.0)
Alkaline Phosphatase: 109 U/L (ref 38–126)
Anion gap: 12 (ref 5–15)
BUN: 47 mg/dL — ABNORMAL HIGH (ref 8–23)
CO2: 28 mmol/L (ref 22–32)
Calcium: 9.2 mg/dL (ref 8.9–10.3)
Chloride: 101 mmol/L (ref 98–111)
Creatinine, Ser: 1.51 mg/dL — ABNORMAL HIGH (ref 0.44–1.00)
GFR calc Af Amer: 39 mL/min — ABNORMAL LOW (ref >60)
GFR calc non Af Amer: 33 mL/min — ABNORMAL LOW (ref >60)
Glucose, Bld: 237 mg/dL — ABNORMAL HIGH (ref 70–99)
Potassium: 4.2 mmol/L (ref 3.5–5.1)
Sodium: 141 mmol/L (ref 135–145)
Total Bilirubin: 1 mg/dL (ref 0.3–1.2)
Total Protein: 7.9 g/dL (ref 6.5–8.1)

## 2019-09-04 LAB — CBC
HCT: 36.2 % (ref 36.0–46.0)
Hemoglobin: 12 g/dL (ref 12.0–15.0)
MCH: 29.8 pg (ref 26.0–34.0)
MCHC: 33.1 g/dL (ref 30.0–36.0)
MCV: 89.8 fL (ref 80.0–100.0)
Platelets: 225 10*3/uL (ref 150–400)
RBC: 4.03 MIL/uL (ref 3.87–5.11)
RDW: 14.6 % (ref 11.5–15.5)
WBC: 9.5 10*3/uL (ref 4.0–10.5)
nRBC: 0 % (ref 0.0–0.2)

## 2019-09-04 LAB — LIPASE, BLOOD: Lipase: 34 U/L (ref 11–51)

## 2019-09-04 MED ORDER — SODIUM CHLORIDE 0.9 % IV SOLN
1.0000 g | Freq: Once | INTRAVENOUS | Status: AC
  Administered 2019-09-04: 1 g via INTRAVENOUS
  Filled 2019-09-04: qty 10

## 2019-09-04 NOTE — ED Triage Notes (Signed)
Pt's family member states that pt had abd pains with dysuria, hematuria for a week.
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
## 2019-09-05 DIAGNOSIS — N1330 Unspecified hydronephrosis: Secondary | ICD-10-CM

## 2019-09-05 DIAGNOSIS — I10 Essential (primary) hypertension: Secondary | ICD-10-CM

## 2019-09-05 DIAGNOSIS — N1831 Chronic kidney disease, stage 3a: Secondary | ICD-10-CM

## 2019-09-05 DIAGNOSIS — R739 Hyperglycemia, unspecified: Secondary | ICD-10-CM

## 2019-09-05 DIAGNOSIS — E1165 Type 2 diabetes mellitus with hyperglycemia: Secondary | ICD-10-CM

## 2019-09-05 DIAGNOSIS — N179 Acute kidney failure, unspecified: Secondary | ICD-10-CM

## 2019-09-05 DIAGNOSIS — N189 Chronic kidney disease, unspecified: Secondary | ICD-10-CM

## 2019-09-05 LAB — COMPREHENSIVE METABOLIC PANEL
ALT: 15 U/L (ref 0–44)
AST: 19 U/L (ref 15–41)
Albumin: 3.7 g/dL (ref 3.5–5.0)
Alkaline Phosphatase: 110 U/L (ref 38–126)
Anion gap: 9 (ref 5–15)
BUN: 38 mg/dL — ABNORMAL HIGH (ref 8–23)
CO2: 29 mmol/L (ref 22–32)
Calcium: 9.1 mg/dL (ref 8.9–10.3)
Chloride: 104 mmol/L (ref 98–111)
Creatinine, Ser: 1.28 mg/dL — ABNORMAL HIGH (ref 0.44–1.00)
GFR calc Af Amer: 47 mL/min — ABNORMAL LOW (ref >60)
GFR calc non Af Amer: 41 mL/min — ABNORMAL LOW (ref >60)
Glucose, Bld: 124 mg/dL — ABNORMAL HIGH (ref 70–99)
Potassium: 3.2 mmol/L — ABNORMAL LOW (ref 3.5–5.1)
Sodium: 142 mmol/L (ref 135–145)
Total Bilirubin: 0.7 mg/dL (ref 0.3–1.2)
Total Protein: 8.3 g/dL — ABNORMAL HIGH (ref 6.5–8.1)

## 2019-09-05 LAB — CBC
HCT: 39.6 % (ref 36.0–46.0)
Hemoglobin: 13 g/dL (ref 12.0–15.0)
MCH: 29.7 pg (ref 26.0–34.0)
MCHC: 32.8 g/dL (ref 30.0–36.0)
MCV: 90.6 fL (ref 80.0–100.0)
Platelets: 252 10*3/uL (ref 150–400)
RBC: 4.37 MIL/uL (ref 3.87–5.11)
RDW: 14.6 % (ref 11.5–15.5)
WBC: 12.1 10*3/uL — ABNORMAL HIGH (ref 4.0–10.5)
nRBC: 0 % (ref 0.0–0.2)

## 2019-09-05 LAB — SARS CORONAVIRUS 2 BY RT PCR (HOSPITAL ORDER, PERFORMED IN ~~LOC~~ HOSPITAL LAB): SARS Coronavirus 2: NEGATIVE

## 2019-09-05 LAB — HEMOGLOBIN A1C
Hgb A1c MFr Bld: 8.1 % — ABNORMAL HIGH (ref 4.8–5.6)
Mean Plasma Glucose: 185.77 mg/dL

## 2019-09-05 LAB — GLUCOSE, CAPILLARY
Glucose-Capillary: 110 mg/dL — ABNORMAL HIGH (ref 70–99)
Glucose-Capillary: 243 mg/dL — ABNORMAL HIGH (ref 70–99)
Glucose-Capillary: 149 mg/dL — ABNORMAL HIGH (ref 70–99)
Glucose-Capillary: 96 mg/dL (ref 70–99)

## 2019-09-05 LAB — HEMOGLOBIN AND HEMATOCRIT, BLOOD
HCT: 32.6 % — ABNORMAL LOW (ref 36.0–46.0)
Hemoglobin: 11 g/dL — ABNORMAL LOW (ref 12.0–15.0)

## 2019-09-05 MED ORDER — ACETAMINOPHEN 650 MG RE SUPP: 650.0000 mg | Freq: Four times a day (QID) | RECTAL | Status: DC | PRN

## 2019-09-05 MED ORDER — ENOXAPARIN SODIUM 30 MG/0.3ML ~~LOC~~ SOLN
30.0000 mg | SUBCUTANEOUS | Status: DC
  Administered 2019-09-05: 30 mg via SUBCUTANEOUS
  Filled 2019-09-05: qty 0.3

## 2019-09-05 MED ORDER — ONDANSETRON HCL 4 MG/2ML IJ SOLN: 4.0000 mg | Freq: Four times a day (QID) | INTRAMUSCULAR | Status: DC | PRN

## 2019-09-05 MED ORDER — INSULIN ASPART 100 UNIT/ML ~~LOC~~ SOLN
0.0000 [IU] | Freq: Three times a day (TID) | SUBCUTANEOUS | Status: DC
  Administered 2019-09-05: 12:00:00 3 [IU] via SUBCUTANEOUS
  Administered 2019-09-05: 17:00:00 1 [IU] via SUBCUTANEOUS
  Administered 2019-09-06: 2 [IU] via SUBCUTANEOUS
  Administered 2019-09-06 (×2): 3 [IU] via SUBCUTANEOUS
  Administered 2019-09-07: 2 [IU] via SUBCUTANEOUS
  Filled 2019-09-05: qty 0.09

## 2019-09-05 MED ORDER — INSULIN GLARGINE 100 UNIT/ML ~~LOC~~ SOLN
20.0000 [IU] | Freq: Two times a day (BID) | SUBCUTANEOUS | Status: DC
  Administered 2019-09-05 – 2019-09-07 (×5): 20 [IU] via SUBCUTANEOUS
  Filled 2019-09-05 (×7): qty 0.2

## 2019-09-05 MED ORDER — PANTOPRAZOLE SODIUM 40 MG PO TBEC
40.0000 mg | DELAYED_RELEASE_TABLET | Freq: Every day | ORAL | Status: DC
  Administered 2019-09-05 – 2019-09-08 (×4): 40 mg via ORAL
  Filled 2019-09-05 (×4): qty 1

## 2019-09-05 MED ORDER — ASPIRIN EC 81 MG PO TBEC
81.0000 mg | DELAYED_RELEASE_TABLET | Freq: Every day | ORAL | Status: DC
  Administered 2019-09-05: 81 mg via ORAL
  Filled 2019-09-05: qty 1

## 2019-09-05 MED ORDER — GABAPENTIN 300 MG PO CAPS
300.0000 mg | ORAL_CAPSULE | Freq: Two times a day (BID) | ORAL | Status: DC
  Administered 2019-09-05 – 2019-09-08 (×8): 300 mg via ORAL
  Filled 2019-09-05 (×8): qty 1

## 2019-09-05 MED ORDER — GLIPIZIDE 5 MG PO TABS: 5.0000 mg | ORAL_TABLET | Freq: Two times a day (BID) | ORAL | Status: DC

## 2019-09-05 MED ORDER — AMLODIPINE BESYLATE 5 MG PO TABS
5.0000 mg | ORAL_TABLET | Freq: Every day | ORAL | Status: DC
  Administered 2019-09-05 – 2019-09-08 (×4): 5 mg via ORAL
  Filled 2019-09-05 (×4): qty 1

## 2019-09-05 MED ORDER — SODIUM CHLORIDE 0.9 % IV SOLN
1.0000 g | INTRAVENOUS | Status: DC
  Administered 2019-09-05 – 2019-09-07 (×3): 1 g via INTRAVENOUS
  Filled 2019-09-05 (×3): qty 1

## 2019-09-05 MED ORDER — ATORVASTATIN CALCIUM 40 MG PO TABS
40.0000 mg | ORAL_TABLET | Freq: Every day | ORAL | Status: DC
  Administered 2019-09-05 – 2019-09-08 (×4): 40 mg via ORAL
  Filled 2019-09-05 (×4): qty 1

## 2019-09-05 MED ORDER — POTASSIUM CHLORIDE 2 MEQ/ML IV SOLN
INTRAVENOUS | Status: DC
  Filled 2019-09-05 (×3): qty 1000

## 2019-09-05 MED ORDER — SODIUM CHLORIDE 0.9 % IV SOLN: INTRAVENOUS | Status: DC

## 2019-09-05 MED ORDER — POTASSIUM CHLORIDE 2 MEQ/ML IV SOLN: INTRAVENOUS | Status: DC

## 2019-09-05 MED ORDER — ACETAMINOPHEN 325 MG PO TABS
650.0000 mg | ORAL_TABLET | Freq: Four times a day (QID) | ORAL | Status: DC | PRN
  Administered 2019-09-05 – 2019-09-08 (×5): 650 mg via ORAL
  Filled 2019-09-05 (×5): qty 2

## 2019-09-05 MED ORDER — ALBUTEROL SULFATE (2.5 MG/3ML) 0.083% IN NEBU: 3.0000 mL | INHALATION_SOLUTION | Freq: Four times a day (QID) | RESPIRATORY_TRACT | Status: DC | PRN

## 2019-09-05 MED ORDER — POTASSIUM CHLORIDE 20 MEQ PO PACK
40.0000 meq | PACK | Freq: Once | ORAL | Status: AC
  Administered 2019-09-05: 40 meq via ORAL
  Filled 2019-09-05: qty 2

## 2019-09-05 MED ORDER — GABAPENTIN 100 MG PO CAPS: 200.0000 mg | ORAL_CAPSULE | Freq: Three times a day (TID) | ORAL | Status: DC

## 2019-09-05 MED ORDER — MORPHINE SULFATE (PF) 2 MG/ML IV SOLN
2.0000 mg | INTRAVENOUS | Status: DC | PRN
  Administered 2019-09-05 – 2019-09-07 (×8): 2 mg via INTRAVENOUS
  Filled 2019-09-05 (×8): qty 1

## 2019-09-05 MED ORDER — ONDANSETRON HCL 4 MG PO TABS: 4.0000 mg | ORAL_TABLET | Freq: Four times a day (QID) | ORAL | Status: DC | PRN

## 2019-09-05 NOTE — Progress Notes (Signed)
PROGRESS NOTE    Nichole Munoz  XNA:[id number]  DOB: 1945-03-04  PCP: [facility] Admit date:09/04/2019 Chief compliant: Dysuria, abdominal pain 74 y.o. female  from [place] (speaks English but not very fluent) with medical history significant of diabetes, diabetic neuropathy, hypertension, hyperlipidemia, morbid obesity, history of renal retention who presented with abdominal pain and left flank pain which has been going on for above 4 to 5 days but getting worse. Family also noted dark urine and rate with evidence of bleeding.  Denied any fever or chills. ED Course: Temperature is 98.4 blood pressure 114/103 pulse 93 respiratory rate of 19 oxygen sats 99% on room air.  Chemistry showed glucose of 237 creatinine 1.51.  CBC within normal.  Urinalysis showed cloudy urine with large glucose large leukocytes WBC more than 50 RBC more than 50 and few bacteria.  CT renal stone shows mild left-sided hydronephrosis with findings suggestive of left-sided pyelonephritis or ureteritis.  No renal or collecting system calculi.  She also has suggestive of diffuse acute cystitis. Hospital course:  Patient will be admitted for treatment and IV antibiotics.    Subjective:  Patient resting comfortably.  Denies any acute complaints except for 4/10 lower abdominal/suprapubic pain.  Objective: Vitals:   09/05/19 0400 09/05/19 0501 09/05/19 0911 09/05/19 1427  BP: (!) 152/71 (!) 156/92 122/69 (!) 93/58  Pulse: 88 100 88 85  Resp: 17 18 16 16   Temp:  98.5 F (36.9 C) 98.1 F (36.7 C) 99.4 F (37.4 C)  TempSrc:  Oral Oral Oral  SpO2: 96% 100% 100% 95%    Intake/Output Summary (Last 24 hours) at 09/05/2019 1715 Last data filed at 09/05/2019 1429 Gross per 24 hour  Intake 833.12 ml  Output 250 ml  Net 583.12 ml   There were no vitals filed for this visit.  Physical Examination:  General: Moderately built, no acute distress noted Head ENT: Atraumatic normocephalic, PERRLA,  neck supple Heart: S1-S2 heard, regular rate and rhythm, no murmurs.  No leg edema noted Lungs: Equal air entry bilaterally, no rhonchi or rales on exam, no accessory muscle use Abdomen: Mild suprapubic tenderness.  Bowel sounds heard, soft, nontender. No organomegaly.?  Mild bilateral CVA tenderness Extremities: No pedal edema.  No cyanosis or clubbing. Neurological: Awake alert oriented x3, no focal weakness or numbness, strength and sensations to crude touch intact.  Mood cheerful Skin: No wounds or rashes.   Data Reviewed: I have personally reviewed following labs and imaging studies  CBC: Recent Labs  Lab 09/04/19 2100 09/05/19 0612  WBC 9.5 12.1*  HGB 12.0 13.0  HCT 36.2 39.6  MCV 89.8 90.6  PLT 225 542   Basic Metabolic Panel: Recent Labs  Lab 09/04/19 2100 09/05/19 0612  NA 141 142  K 4.2 3.2*  CL 101 104  CO2 28 29  GLUCOSE 237* 124*  BUN 47* 38*  CREATININE 1.51* 1.28*  CALCIUM 9.2 9.1   GFR: CrCl cannot be calculated (Unknown ideal weight.). Liver Function Tests: Recent Labs  Lab 09/04/19 2100 09/05/19 0612  AST 24 19  ALT 15 15  ALKPHOS 109 110  BILITOT 1.0 0.7  PROT 7.9 8.3*  ALBUMIN 3.8 3.7   Recent Labs  Lab 09/04/19 2100  LIPASE 34   No results for input(s): AMMONIA in the last 168 hours. Coagulation Profile: No results for input(s): INR, PROTIME in the last 168 hours. Cardiac Enzymes: No results for input(s): CKTOTAL, CKMB, CKMBINDEX, TROPONINI in the last 168 hours. BNP (last  3 results) No results for input(s): PROBNP in the last 8760 hours. HbA1C: Recent Labs    09/04/19 2100  HGBA1C 8.1*   CBG: Recent Labs  Lab 09/05/19 0733 09/05/19 1142  GLUCAP 110* 243*   Lipid Profile: No results for input(s): CHOL, HDL, LDLCALC, TRIG, CHOLHDL, LDLDIRECT in the last 72 hours. Thyroid Function Tests: No results for input(s): TSH, T4TOTAL, FREET4, T3FREE, THYROIDAB in the last 72 hours. Anemia Panel: No results for input(s):  VITAMINB12, FOLATE, FERRITIN, TIBC, IRON, RETICCTPCT in the last 72 hours. Sepsis Labs: No results for input(s): PROCALCITON, LATICACIDVEN in the last 168 hours.  Recent Results (from the past 240 hour(s))  SARS Coronavirus 2 by RT PCR (hospital order, performed in [facility] hospital lab) Nasopharyngeal Nasopharyngeal Swab     Status: None   Collection Time: 09/04/19 11:01 PM   Specimen: Nasopharyngeal Swab  Result Value Ref Range Status   SARS Coronavirus 2 NEGATIVE NEGATIVE Final    Comment: (NOTE) SARS-CoV-2 target nucleic acids are NOT DETECTED.  The SARS-CoV-2 RNA is generally detectable in upper and lower respiratory specimens during the acute phase of infection. The lowest concentration of SARS-CoV-2 viral copies this assay can detect is 250 copies / mL. A negative result does not preclude SARS-CoV-2 infection and should not be used as the sole basis for treatment or other patient management decisions.  A negative result may occur with improper specimen collection / handling, submission of specimen other than nasopharyngeal swab, presence of viral mutation(s) within the areas targeted by this assay, and inadequate number of viral copies (<250 copies / mL). A negative result must be combined with clinical observations, patient history, and epidemiological information.  Fact Sheet for Patients:   StrictlyIdeas.no  Fact Sheet for Healthcare Providers: BankingDealers.co.za  This test is not yet approved or  cleared by the [place] FDA and has been authorized for detection and/or diagnosis of SARS-CoV-2 by FDA under an Emergency Use Authorization (EUA).  This EUA will remain in effect (meaning this test can be used) for the duration of the COVID-19 declaration under Section 564(b)(1) of the Act, 21 U.S.C. section 360bbb-3(b)(1), unless the authorization is terminated or revoked sooner.  Performed at [facility], [place] [address], [place], [place] [address]       Radiology Studies: CT Renal Stone Study  Result Date: 09/04/2019 CLINICAL DATA:  Flank pain hematuria EXAM: CT ABDOMEN AND PELVIS WITHOUT CONTRAST TECHNIQUE: Multidetector CT imaging of the abdomen and pelvis was performed following the standard protocol without IV contrast. COMPARISON:  February 18, 2018 FINDINGS: Lower chest: The visualized heart size within normal limits. No pericardial fluid/thickening. No hiatal hernia. The visualized portions of the lungs are clear. Hepatobiliary: Although limited due to the lack of intravenous contrast, normal in appearance without gross focal abnormality. No evidence of calcified gallstones or biliary ductal dilatation. Pancreas:  Unremarkable.  No surrounding inflammatory changes. Spleen: Normal in size. Although limited due to the lack of intravenous contrast, normal in appearance. Adrenals/Urinary Tract: Both adrenal glands appear normal. Mild left-sided pelvicaliectasis and ureterectasis with urothelial wall thickening down to the level of the UVJ. There is also mild left-sided perinephric and periureteral stranding. There is diffuse bladder wall thickening and surrounding inflammatory changes. No renal or collecting system calculi are seen. Stomach/Bowel: The stomach, small bowel, and colon are normal in appearance. No inflammatory changes or obstructive findings. Scattered colonic diverticula are noted. Vascular/Lymphatic: There are no enlarged abdominal or pelvic lymph nodes. Scattered aortic atherosclerotic  calcifications are seen without aneurysmal dilatation. Reproductive: The uterus and adnexa are unremarkable. Other: No evidence of abdominal wall mass or hernia. Musculoskeletal: No acute or significant osseous findings. 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 IMPRESSION: Mild left-sided hydronephrosis with findings suggestive of left-sided  pyelonephritis/ureteritis. No renal or collecting system calculi Findings suggestive of diffuse acute cystitis. Aortic Atherosclerosis (ICD10-I70.0). Electronically Signed   By: Prudencio Pair M.D.   On: 09/04/2019 22:02      Scheduled Meds: . amLODipine  5 mg Oral Daily  . aspirin EC  81 mg Oral Daily  . atorvastatin  40 mg Oral Daily  . enoxaparin (LOVENOX) injection  30 mg Subcutaneous Q24H  . gabapentin  300 mg Oral BID  . insulin aspart  0-9 Units Subcutaneous TID WC  . insulin glargine  20 Units Subcutaneous BID  . pantoprazole  40 mg Oral QHS   Continuous Infusions: . cefTRIAXone (ROCEPHIN)  IV    . lactated ringers with kcl 100 mL/hr at 09/05/19 1333      Assessment/Plan:  1.  Acute cystitis, pyelonephritis: UA showed cloudy appearance with greater than 500 glucose, large leukocyte esterase, few bacteria, greater than 50 RBC and greater than 50 WBC.?  Urine culture not sent.  CT findings suggestive of pyelonephritis/ureteritis/cystitis and left sided hydronephrosis.  Patient noted to have gross hematuria.  Patient admitted with IV Rocephin.  Send urine cultures.  2.  Gross hematuria: Monitor hemoglobin and hematocrit.  Likely secondary to cystitis.No definite ureteral calculi noted on CT.  Continue antibiotics, consulted urology for further evaluation and possible need for cystoscopy given thickened bladder appearance.  Hold Lovenox, aspirin  3.  Left hydronephrosis: May have passed a stone.  Per urology repeat renal ultrasonogram in 1-2 weeks as outpatient.  No need for cystoscopy per urology at this point to rule out distal ureteric obstruction/bladder mass.  4.  CKD stage 3A with dehydration: Continue IV fluids and follow-up serial labs.  Baseline creatinine appears to be 1.0-1.4.  On presentation creatinine up to 1.5 (elevated from baseline but does not meet criteria for AKI).  Today improved to 1.28.  BUN elevated at 47 on presentation likely in the setting of hematuria and  dehydration, now improving.  5.  Diabetes mellitus: Poorly controlled.  Blood glucose in 200s on presentation.  Hemoglobin A1c at 8.1.  Blood glucose this morning 124.  Continue sliding scale insulin.  Add low-dose glipizide.  6. Hypertension: Resumed home medications-Norvasc.  7.  Hypokalemia: Likely dilutional.  Changed IV fluids to potassium supplemented.  Labs in a.m.  DVT prophylaxis: Hold Lovenox given hematuria Code Status: Full code Family / Patient Communication: Discussed with patient.  Discussed with urology Disposition Plan:   Status is: Inpatient  Remains inpatient appropriate because:IV treatments appropriate due to intensity of illness or inability to take PO   Dispo: The patient is from: Home              Anticipated d/c is to: Home              Anticipated d/c date is: 2 days              Patient currently is not medically stable to d/c.          Time spent: 35 MINS   >50% time spent in discussions with care team and coordination of care.    Guilford Shi, MD [facility] Pager in [place]  If 7PM-7AM,  please contact night-coverage [email or web address] 09/05/2019, 5:15 PM

## 2019-09-05 NOTE — Progress Notes (Signed)
Admission questions completed using translator iPad.
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
## 2019-09-06 ENCOUNTER — Inpatient Hospital Stay (HOSPITAL_COMMUNITY): Payer: Medicare Other

## 2019-09-06 LAB — CBC
HCT: 35.4 % — ABNORMAL LOW (ref 36.0–46.0)
Hemoglobin: 11.5 g/dL — ABNORMAL LOW (ref 12.0–15.0)
MCH: 30.2 pg (ref 26.0–34.0)
MCHC: 32.5 g/dL (ref 30.0–36.0)
MCV: 92.9 fL (ref 80.0–100.0)
Platelets: 199 10*3/uL (ref 150–400)
RBC: 3.81 MIL/uL — ABNORMAL LOW (ref 3.87–5.11)
RDW: 14.6 % (ref 11.5–15.5)
WBC: 12 10*3/uL — ABNORMAL HIGH (ref 4.0–10.5)
nRBC: 0 % (ref 0.0–0.2)

## 2019-09-06 LAB — GLUCOSE, CAPILLARY
Glucose-Capillary: 203 mg/dL — ABNORMAL HIGH (ref 70–99)
Glucose-Capillary: 189 mg/dL — ABNORMAL HIGH (ref 70–99)
Glucose-Capillary: 219 mg/dL — ABNORMAL HIGH (ref 70–99)
Glucose-Capillary: 168 mg/dL — ABNORMAL HIGH (ref 70–99)

## 2019-09-06 LAB — BASIC METABOLIC PANEL
Anion gap: 9 (ref 5–15)
BUN: 41 mg/dL — ABNORMAL HIGH (ref 8–23)
CO2: 23 mmol/L (ref 22–32)
Calcium: 7.9 mg/dL — ABNORMAL LOW (ref 8.9–10.3)
Chloride: 103 mmol/L (ref 98–111)
Creatinine, Ser: 1.75 mg/dL — ABNORMAL HIGH (ref 0.44–1.00)
GFR calc Af Amer: 32 mL/min — ABNORMAL LOW (ref >60)
GFR calc non Af Amer: 28 mL/min — ABNORMAL LOW (ref >60)
Glucose, Bld: 262 mg/dL — ABNORMAL HIGH (ref 70–99)
Potassium: 5.1 mmol/L (ref 3.5–5.1)
Sodium: 135 mmol/L (ref 135–145)

## 2019-09-06 LAB — HEMOGLOBIN AND HEMATOCRIT, BLOOD
HCT: 28.9 % — ABNORMAL LOW (ref 36.0–46.0)
HCT: 33.5 % — ABNORMAL LOW (ref 36.0–46.0)
Hemoglobin: 9.3 g/dL — ABNORMAL LOW (ref 12.0–15.0)
Hemoglobin: 11.1 g/dL — ABNORMAL LOW (ref 12.0–15.0)

## 2019-09-06 MED ORDER — DICYCLOMINE HCL 20 MG PO TABS
20.0000 mg | ORAL_TABLET | Freq: Three times a day (TID) | ORAL | Status: DC | PRN
  Administered 2019-09-06 – 2019-09-07 (×2): 20 mg via ORAL
  Filled 2019-09-06 (×3): qty 1

## 2019-09-06 MED ORDER — SODIUM CHLORIDE 0.9 % IV SOLN: INTRAVENOUS | Status: DC

## 2019-09-06 NOTE — Progress Notes (Signed)
PROGRESS NOTE    Nichole Munoz  [name]  DOB: 07-12-1945  PCP: [facility], [facility] Admit date:09/04/2019 Chief compliant: Dysuria, abdominal pain 74 y.o. female  from [place] (speaks English but not very fluent) with medical history significant of diabetes, diabetic neuropathy, hypertension, hyperlipidemia, morbid obesity, history of renal retention who presented with abdominal pain and left flank pain which has been going on for above 4 to 5 days but getting worse. Family also noted dark urine and rate with evidence of bleeding.  Denied any fever or chills. ED Course: Temperature is 98.4 blood pressure 114/103 pulse 93 respiratory rate of 19 oxygen sats 99% on room air.  Chemistry showed glucose of 237 creatinine 1.51.  CBC within normal.  Urinalysis showed cloudy urine with large glucose large leukocytes WBC more than 50 RBC more than 50 and few bacteria.  CT renal stone shows mild left-sided hydronephrosis with findings suggestive of left-sided pyelonephritis or ureteritis.  No renal or collecting system calculi.  She also has suggestive of diffuse acute cystitis. Hospital course:  Patient will be admitted for treatment and IV antibiotics.  Subjective:  Patient in no acute distress, continues to c/o bladder spasms/pain. Hematuria clearing up  Objective: Vitals:   09/05/19 0911 09/05/19 1427 09/05/19 2111 09/06/19 0620  BP: 122/69 (!) 93/58 119/64 127/63  Pulse: 88 85 97 79  Resp: 16 16 16 16   Temp: 98.1 F (36.7 C) 99.4 F (37.4 C) 100 F (37.8 C) 98.3 F (36.8 C)  TempSrc: Oral Oral Oral Oral  SpO2: 100% 95% 100% 97%    Intake/Output Summary (Last 24 hours) at 09/06/2019 1212 Last data filed at 09/06/2019 5638 Gross per 24 hour  Intake 2987.95 ml  Output 1150 ml  Net 1837.95 ml   There were no vitals filed for this visit.  Physical Examination:  General: Moderately built, no acute distress noted Head ENT: Atraumatic normocephalic, PERRLA, neck  supple Heart: S1-S2 heard, regular rate and rhythm, no murmurs.  No leg edema noted Lungs: Equal air entry bilaterally, no rhonchi or rales on exam, no accessory muscle use Abdomen: Mild suprapubic tenderness.  Bowel sounds heard, soft, nontender. No organomegaly.?  Mild bilateral CVA tenderness Extremities: No pedal edema.  No cyanosis or clubbing. Neurological: Awake alert oriented x3, no focal weakness or numbness, strength and sensations to crude touch intact.  Mood cheerful Skin: No wounds or rashes.   Data Reviewed: I have personally reviewed following labs and imaging studies  CBC: Recent Labs  Lab 09/04/19 2100 09/05/19 0612 09/05/19 1807 09/06/19 0520  WBC 9.5 12.1*  --   --   HGB 12.0 13.0 11.0* 9.3*  HCT 36.2 39.6 32.6* 28.9*  MCV 89.8 90.6  --   --   PLT 225 252  --   --    Basic Metabolic Panel: Recent Labs  Lab 09/04/19 2100 09/05/19 0612 09/06/19 0520  NA 141 142 135  K 4.2 3.2* 5.1  CL 101 104 103  CO2 28 29 23   GLUCOSE 237* 124* 262*  BUN 47* 38* 41*  CREATININE 1.51* 1.28* 1.75*  CALCIUM 9.2 9.1 7.9*   GFR: CrCl cannot be calculated (Unknown ideal weight.). Liver Function Tests: Recent Labs  Lab 09/04/19 2100 09/05/19 0612  AST 24 19  ALT 15 15  ALKPHOS 109 110  BILITOT 1.0 0.7  PROT 7.9 8.3*  ALBUMIN 3.8 3.7   Recent Labs  Lab 09/04/19 2100  LIPASE 34   No results for input(s): AMMONIA in  the last 168 hours. Coagulation Profile: No results for input(s): INR, PROTIME in the last 168 hours. Cardiac Enzymes: No results for input(s): CKTOTAL, CKMB, CKMBINDEX, TROPONINI in the last 168 hours. BNP (last 3 results) No results for input(s): PROBNP in the last 8760 hours. HbA1C: Recent Labs    09/04/19 2100  HGBA1C 8.1*   CBG: Recent Labs  Lab 09/05/19 1142 09/05/19 1722 09/05/19 2142 09/06/19 0745 09/06/19 1119  GLUCAP 243* 149* 96 203* 189*   Lipid Profile: No results for input(s): CHOL, HDL, LDLCALC, TRIG, CHOLHDL,  LDLDIRECT in the last 72 hours. Thyroid Function Tests: No results for input(s): TSH, T4TOTAL, FREET4, T3FREE, THYROIDAB in the last 72 hours. Anemia Panel: No results for input(s): VITAMINB12, FOLATE, FERRITIN, TIBC, IRON, RETICCTPCT in the last 72 hours. Sepsis Labs: No results for input(s): PROCALCITON, LATICACIDVEN in the last 168 hours.  Recent Results (from the past 240 hour(s))  SARS Coronavirus 2 by RT PCR (hospital order, performed in [facility] hospital lab) Nasopharyngeal Nasopharyngeal Swab     Status: None   Collection Time: 09/04/19 11:01 PM   Specimen: Nasopharyngeal Swab  Result Value Ref Range Status   SARS Coronavirus 2 NEGATIVE NEGATIVE Final    Comment: (NOTE) SARS-CoV-2 target nucleic acids are NOT DETECTED.  The SARS-CoV-2 RNA is generally detectable in upper and lower respiratory specimens during the acute phase of infection. The lowest concentration of SARS-CoV-2 viral copies this assay can detect is 250 copies / mL. A negative result does not preclude SARS-CoV-2 infection and should not be used as the sole basis for treatment or other patient management decisions.  A negative result may occur with improper specimen collection / handling, submission of specimen other than nasopharyngeal swab, presence of viral mutation(s) within the areas targeted by this assay, and inadequate number of viral copies (<250 copies / mL). A negative result must be combined with clinical observations, patient history, and epidemiological information.  Fact Sheet for Patients:   StrictlyIdeas.no  Fact Sheet for Healthcare Providers: BankingDealers.co.za  This test is not yet approved or  cleared by the [place] FDA and has been authorized for detection and/or diagnosis of SARS-CoV-2 by FDA under an Emergency Use Authorization (EUA).  This EUA will remain in effect (meaning this test can be used) for the duration of  the COVID-19 declaration under Section 564(b)(1) of the Act, 21 U.S.C. section 360bbb-3(b)(1), unless the authorization is terminated or revoked sooner.  Performed at [facility], [place] [address], [place], [place] [address]       Radiology Studies: CT Renal Stone Study  Result Date: 09/04/2019 CLINICAL DATA:  Flank pain hematuria EXAM: CT ABDOMEN AND PELVIS WITHOUT CONTRAST TECHNIQUE: Multidetector CT imaging of the abdomen and pelvis was performed following the standard protocol without IV contrast. COMPARISON:  February 18, 2018 FINDINGS: Lower chest: The visualized heart size within normal limits. No pericardial fluid/thickening. No hiatal hernia. The visualized portions of the lungs are clear. Hepatobiliary: Although limited due to the lack of intravenous contrast, normal in appearance without gross focal abnormality. No evidence of calcified gallstones or biliary ductal dilatation. Pancreas:  Unremarkable.  No surrounding inflammatory changes. Spleen: Normal in size. Although limited due to the lack of intravenous contrast, normal in appearance. Adrenals/Urinary Tract: Both adrenal glands appear normal. Mild left-sided pelvicaliectasis and ureterectasis with urothelial wall thickening down to the level of the UVJ. There is also mild left-sided perinephric and periureteral stranding. There is diffuse bladder wall thickening and surrounding inflammatory changes.  No renal or collecting system calculi are seen. Stomach/Bowel: The stomach, small bowel, and colon are normal in appearance. No inflammatory changes or obstructive findings. Scattered colonic diverticula are noted. Vascular/Lymphatic: There are no enlarged abdominal or pelvic lymph nodes. Scattered aortic atherosclerotic calcifications are seen without aneurysmal dilatation. Reproductive: The uterus and adnexa are unremarkable. Other: No evidence of abdominal wall mass or hernia. Musculoskeletal: No acute or significant  osseous findings. 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 IMPRESSION: Mild left-sided hydronephrosis with findings suggestive of left-sided pyelonephritis/ureteritis. No renal or collecting system calculi Findings suggestive of diffuse acute cystitis. Aortic Atherosclerosis (ICD10-I70.0). Electronically Signed   By: Prudencio Pair M.D.   On: 09/04/2019 22:02      Scheduled Meds: . amLODipine  5 mg Oral Daily  . atorvastatin  40 mg Oral Daily  . gabapentin  300 mg Oral BID  . insulin aspart  0-9 Units Subcutaneous TID WC  . insulin glargine  20 Units Subcutaneous BID  . pantoprazole  40 mg Oral QHS   Continuous Infusions: . sodium chloride 125 mL/hr at 09/06/19 0936  . cefTRIAXone (ROCEPHIN)  IV 1 g (09/05/19 2128)      Assessment/Plan:  1.  Acute cystitis, pyelonephritis: UA showed cloudy appearance with greater than 500 glucose, large leukocyte esterase, few bacteria, greater than 50 RBC and greater than 50 WBC.?  Urine culture not sent on admission.  CT findings suggestive of pyelonephritis/ureteritis/cystitis and left sided hydronephrosis.  Patient noted to have gross hematuria.  Patient admitted with IV Rocephin. Ordered urine cultures.  2.  Gross hematuria: Monitor hemoglobin and hematocrit.  Likely secondary to cystitis.No definite ureteral calculi noted on CT.  Continue antibiotics, consulted urology for further evaluation and possible need for cystoscopy given thickened bladder appearance.  Held Lovenox, aspirin Hematuria clearing up today. Hgb dropped to 9s. Monitor for now  3.  Left hydronephrosis: May have passed a stone.  Per urology repeat renal ultrasonogram in 1-2 weeks as outpatient.  No need for cystoscopy per urology at this point to rule out distal ureteric obstruction/bladder mass. Given elevated creat, will obtain renal USG today  4. AKI on  CKD stage 3A with dehydration: Continue IV fluids and follow-up serial labs.  Baseline  creatinine appears to be 1.0-1.4.  On presentation creatinine up to 1.5 (elevated from baseline but does not meet criteria for AKI) , improved yesterday but worse today at 1.7 (meets AKI creiterion) . Continue IV fluids , Renal USG ordered--will touch base with urology if new findings  5.  Diabetes mellitus: Poorly controlled.  Blood glucose in 200s on presentation.  Hemoglobin A1c at 8.1.  Blood glucose this morning 124.  Continue sliding scale insulin.  Add low-dose glipizide.  6. Hypertension: Resumed home medications-Norvasc.--BP better today  7.  Hypokalemia:Changed IV fluids to potassium supplemented yesterday and labs today show potassium at 5.1. changed back to Normal saline. .    DVT prophylaxis: Hold Lovenox given hematuria Code Status: Full code Family / Patient Communication: Discussed with patient.   Disposition Plan:   Status is: Inpatient  Remains inpatient appropriate because:IV treatments appropriate due to intensity of illness or inability to take PO   Dispo: The patient is from: Home              Anticipated d/c is to: Home              Anticipated d/c date is: 2 days  Patient currently is not medically stable to d/c.          Time spent: 35 MINS   >50% time spent in discussions with care team and coordination of care.    Guilford Shi, MD [facility] Pager in [place]  If 7PM-7AM, please contact night-coverage [email or web address] 09/06/2019, 12:12 PM

## 2019-09-06 NOTE — Progress Notes (Signed)
Inpatient Diabetes Program Recommendations  AACE/ADA: New Consensus Statement on Inpatient Glycemic Control (2015)  Target Ranges:  Prepandial:   less than 140 mg/dL      Peak postprandial:   less than 180 mg/dL (1-2 hours)      Critically ill patients:  140 - 180 mg/dL   Lab Results  Component Value Date   GLUCAP 203 (H) 09/06/2019   HGBA1C 8.1 (H) 09/04/2019    Review of Glycemic Control Results for Nichole Munoz, Nichole Munoz (MRN [id number]) as of 09/06/2019 09:36  Ref. Range 09/05/2019 07:33 09/05/2019 11:42 09/05/2019 17:22 09/05/2019 21:42 09/06/2019 07:45  Glucose-Capillary Latest Ref Range: 70 - 99 mg/dL 110 (H) 243 (H) 149 (H) 96 203 (H)   Diabetes history: DM 2 Outpatient Diabetes medications: Glipizide 5 mg bid, Lantus 20 units bid Current orders for Inpatient glycemic control:  Lantus 20 units bid Novolog 0-9 units tid  Inpatient Diabetes Program Recommendations:    Please note Lantus not given last night due to "orders parameters not met"? MD never contacted. Glucose 203 this am.  -  Change Lantus dose to 20 units Daily, 10 units qpm.  Thanks,  Tama Headings RN, MSN, BC-ADM Inpatient Diabetes Coordinator Team Pager [phone] (8a-5p)

## 2019-09-07 ENCOUNTER — Other Ambulatory Visit: Payer: Self-pay

## 2019-09-07 DIAGNOSIS — R310 Gross hematuria: Secondary | ICD-10-CM

## 2019-09-07 DIAGNOSIS — Z789 Other specified health status: Secondary | ICD-10-CM

## 2019-09-07 LAB — CBC
HCT: 30.3 % — ABNORMAL LOW (ref 36.0–46.0)
Hemoglobin: 9.7 g/dL — ABNORMAL LOW (ref 12.0–15.0)
MCH: 29.8 pg (ref 26.0–34.0)
MCHC: 32 g/dL (ref 30.0–36.0)
MCV: 93.2 fL (ref 80.0–100.0)
Platelets: 179 10*3/uL (ref 150–400)
RBC: 3.25 MIL/uL — ABNORMAL LOW (ref 3.87–5.11)
RDW: 14.6 % (ref 11.5–15.5)
WBC: 7.7 10*3/uL (ref 4.0–10.5)
nRBC: 0 % (ref 0.0–0.2)

## 2019-09-07 LAB — BASIC METABOLIC PANEL
Anion gap: 11 (ref 5–15)
BUN: 33 mg/dL — ABNORMAL HIGH (ref 8–23)
CO2: 23 mmol/L (ref 22–32)
Calcium: 8.3 mg/dL — ABNORMAL LOW (ref 8.9–10.3)
Chloride: 109 mmol/L (ref 98–111)
Creatinine, Ser: 1.33 mg/dL — ABNORMAL HIGH (ref 0.44–1.00)
GFR calc Af Amer: 45 mL/min — ABNORMAL LOW (ref >60)
GFR calc non Af Amer: 39 mL/min — ABNORMAL LOW (ref >60)
Glucose, Bld: 75 mg/dL (ref 70–99)
Potassium: 4.4 mmol/L (ref 3.5–5.1)
Sodium: 143 mmol/L (ref 135–145)

## 2019-09-07 LAB — GLUCOSE, CAPILLARY
Glucose-Capillary: 94 mg/dL (ref 70–99)
Glucose-Capillary: 177 mg/dL — ABNORMAL HIGH (ref 70–99)
Glucose-Capillary: 117 mg/dL — ABNORMAL HIGH (ref 70–99)
Glucose-Capillary: 185 mg/dL — ABNORMAL HIGH (ref 70–99)

## 2019-09-07 NOTE — Care Management Important Message (Signed)
Important Message  Patient Details IM Letter given to the Patient Name: Nichole Munoz MRN: [id number] Date of Birth: [birth year]   Medicare Important Message Given:  Yes     Nichole Munoz 09/07/2019, 12:42 PM

## 2019-09-07 NOTE — Progress Notes (Signed)
PROGRESS NOTE  Nichole Munoz NOM:[id number] DOB: Jul 14, 1945   PCP: [facility]  Patient is from: Home.  DOA: 09/04/2019 LOS: 3  Brief Narrative / Interim history: 74 y.o.female from [place] (speaks English but not very fluent) with medical history significant ofdiabetes, diabetic neuropathy, hypertension, hyperlipidemia, morbid obesity, history of renal retention who presented with abdominal pain and left flank pain which has been going on for above 4 to 5 days but getting worse. Family also noted dark urine and rate with evidence of bleeding. Denied any fever or chills.  ED Course:  Hemodynamically stable.  Afebrile.  Normal saturation.  Glucose 237.  Creatinine 1.51.  CBC normal. UA somewhat concerning. CT renal stone shows mild left-sided hydronephrosis with findings suggestive of left-sided pyelonephritis or ureteritis. No renal or collecting system calculi. She also has suggestive of diffuse acute cystitis.  Started on IV ceftriaxone.   Urology consulted due to hematuria, and recommended repeat ultrasound in 1 to 2 weeks and PVR.  Subjective: Seen and examined earlier this morning.  No major events overnight of this morning.  Reports improvement in her dysuria, hematuria and lower abdominal pain.  Denies chest pain, dyspnea, nausea or vomiting.  Eager to go home.  Objective: Vitals:   09/06/19 2109 09/07/19 0619 09/07/19 1100 09/07/19 1341  BP: (!) 155/72 100/62  (!) 110/58  Pulse: (!) 103 81  83  Resp: 16 16  17   Temp: 100.3 F (37.9 C) 98 F (36.7 C)  98.9 F (37.2 C)  TempSrc: Oral Oral  Oral  SpO2: 97% 97%  99%  Weight:   56.5 kg   Height:   5\' 3"  (1.6 m)     Intake/Output Summary (Last 24 hours) at 09/07/2019 1552 Last data filed at 09/07/2019 1347 Gross per 24 hour  Intake 3944.66 ml  Output 3000 ml  Net 944.66 ml   Filed Weights   09/07/19 1100  Weight: 56.5 kg    Examination:  GENERAL: No apparent distress.  Nontoxic. HEENT:  MMM.  Vision and hearing grossly intact.  NECK: Supple.  No apparent JVD.  RESP:  No IWOB.  Fair aeration bilaterally. CVS:  RRR. Heart sounds normal.  ABD/GI/GU: BS+. Abd soft, NTND.  MSK/EXT:  Moves extremities. No apparent deformity. No edema.  SKIN: no apparent skin lesion or wound NEURO: Awake, alert and oriented appropriately.  No apparent focal neuro deficit. PSYCH: Calm. Normal affect.  Procedures:  None  Microbiology summarized: COVID-19 PCR negative. Urine culture pending-obtained after antibiotics.  Assessment & Plan: Acute cystitis, pyelonephritis with gross hematuria and left hydronephrosis: Improving. -Continue IV ceftriaxone pending urine cultures.  Of note, cultures obtained after antibiotic.  Gross hematuriadue to cystitis. No definite ureteral calculi noted on CT. Urine clearing.  H&H stable.  Mild left hydronephrosis -Continue IV antibiotics as above  -Urology recommended PVR in-house and repeat ultrasound in 1 to 2 weeks to ensure resolution  -SCD for VTE prophylaxis. -Monitor H&H.  AKI on CKD-3A: Likely prerenal from dehydration. Cr 1.5 (admit)> 1.7> 1.33.  Baseline 1.0-1.4. -Continue gentle IV fluid -Avoid nephrotoxic meds -Continue monitoring  Uncontrolled DM-2 with hyperglycemia: A1c 8.1%. Recent Labs  Lab 09/06/19 1119 09/06/19 1621 09/06/19 2113 09/07/19 0730 09/07/19 1121  GLUCAP 189* 219* 168* 94 177*  -Continue current regimen  Essential hypertension: Normotensive for most part. -Continue home amlodipine.  Hypokalemia: Resolved..    Language barrier affecting communication -Utilized video interpreter with ID number [id number]  Body mass index is 22.06 kg/m.  DVT prophylaxis: SCD   Code Status: Full code Family Communication: Patient and/or RN. Available if any question.  Status is: Inpatient  Remains inpatient appropriate because:IV treatments appropriate due to intensity of illness or inability to take PO and  Inpatient level of care appropriate due to severity of illness.  Remains on IV ceftriaxone pending urine cultures.   Dispo: The patient is from: Home              Anticipated d/c is to: Home              Anticipated d/c date is: 2 days              Patient currently is not medically stable to d/c.       Consultants:  Urology   Sch Meds:  Scheduled Meds: . amLODipine  5 mg Oral Daily  . atorvastatin  40 mg Oral Daily  . gabapentin  300 mg Oral BID  . insulin aspart  0-9 Units Subcutaneous TID WC  . insulin glargine  20 Units Subcutaneous BID  . pantoprazole  40 mg Oral QHS   Continuous Infusions: . sodium chloride 125 mL/hr at 09/06/19 2152  . cefTRIAXone (ROCEPHIN)  IV 1 g (09/06/19 2153)   PRN Meds:.acetaminophen **OR** acetaminophen, albuterol, dicyclomine, morphine injection, ondansetron **OR** ondansetron (ZOFRAN) IV  Antimicrobials: Anti-infectives (From admission, onward)   Start     Dose/Rate Route Frequency Ordered Stop   09/05/19 2200  cefTRIAXone (ROCEPHIN) 1 g in sodium chloride 0.9 % 100 mL IVPB        1 g 200 mL/hr over 30 Minutes Intravenous Every 24 hours 09/05/19 0822     09/04/19 2200  cefTRIAXone (ROCEPHIN) 1 g in sodium chloride 0.9 % 100 mL IVPB        1 g 200 mL/hr over 30 Minutes Intravenous  Once 09/04/19 2156 09/04/19 2238       I have personally reviewed the following labs and images: CBC: Recent Labs  Lab 09/04/19 2100 09/04/19 2100 09/05/19 0612 09/05/19 0612 09/05/19 1807 09/06/19 0520 09/06/19 1233 09/06/19 1722 09/07/19 0517  WBC 9.5  --  12.1*  --   --   --  12.0*  --  7.7  HGB 12.0   < > 13.0   < > 11.0* 9.3* 11.5* 11.1* 9.7*  HCT 36.2   < > 39.6   < > 32.6* 28.9* 35.4* 33.5* 30.3*  MCV 89.8  --  90.6  --   --   --  92.9  --  93.2  PLT 225  --  252  --   --   --  199  --  179   < > = values in this interval not displayed.   BMP &GFR Recent Labs  Lab 09/04/19 2100 09/05/19 0612 09/06/19 0520 09/07/19 0517  NA 141  142 135 143  K 4.2 3.2* 5.1 4.4  CL 101 104 103 109  CO2 28 29 23 23   GLUCOSE 237* 124* 262* 75  BUN 47* 38* 41* 33*  CREATININE 1.51* 1.28* 1.75* 1.33*  CALCIUM 9.2 9.1 7.9* 8.3*   Estimated Creatinine Clearance: 30.2 mL/min (A) (by C-G formula based on SCr of 1.33 mg/dL (H)). Liver & Pancreas: Recent Labs  Lab 09/04/19 2100 09/05/19 0612  AST 24 19  ALT 15 15  ALKPHOS 109 110  BILITOT 1.0 0.7  PROT 7.9 8.3*  ALBUMIN 3.8 3.7   Recent Labs  Lab 09/04/19 2100  LIPASE 34  No results for input(s): AMMONIA in the last 168 hours. Diabetic: Recent Labs    09/04/19 2100  HGBA1C 8.1*   Recent Labs  Lab 09/06/19 1119 09/06/19 1621 09/06/19 2113 09/07/19 0730 09/07/19 1121  GLUCAP 189* 219* 168* 94 177*   Cardiac Enzymes: No results for input(s): CKTOTAL, CKMB, CKMBINDEX, TROPONINI in the last 168 hours. No results for input(s): PROBNP in the last 8760 hours. Coagulation Profile: No results for input(s): INR, PROTIME in the last 168 hours. Thyroid Function Tests: No results for input(s): TSH, T4TOTAL, FREET4, T3FREE, THYROIDAB in the last 72 hours. Lipid Profile: No results for input(s): CHOL, HDL, LDLCALC, TRIG, CHOLHDL, LDLDIRECT in the last 72 hours. Anemia Panel: No results for input(s): VITAMINB12, FOLATE, FERRITIN, TIBC, IRON, RETICCTPCT in the last 72 hours. Urine analysis:    Component Value Date/Time   COLORURINE YELLOW 09/04/2019 2018   APPEARANCEUR CLOUDY (A) 09/04/2019 2018   LABSPEC 1.014 09/04/2019 2018   PHURINE 6.0 09/04/2019 2018   GLUCOSEU >=500 (A) 09/04/2019 2018   HGBUR LARGE (A) 09/04/2019 2018   [place] NEGATIVE 09/04/2019 2018   KETONESUR NEGATIVE 09/04/2019 2018   PROTEINUR >=300 (A) 09/04/2019 2018   UROBILINOGEN 1.0 09/23/2014 1458   NITRITE NEGATIVE 09/04/2019 2018   LEUKOCYTESUR LARGE (A) 09/04/2019 2018   Sepsis Labs: Invalid input(s): PROCALCITONIN, [place]  Microbiology: Recent Results (from the past 240 hour(s))   SARS Coronavirus 2 by RT PCR (hospital order, performed in [facility] hospital lab) Nasopharyngeal Nasopharyngeal Swab     Status: None   Collection Time: 09/04/19 11:01 PM   Specimen: Nasopharyngeal Swab  Result Value Ref Range Status   SARS Coronavirus 2 NEGATIVE NEGATIVE Final    Comment: (NOTE) SARS-CoV-2 target nucleic acids are NOT DETECTED.  The SARS-CoV-2 RNA is generally detectable in upper and lower respiratory specimens during the acute phase of infection. The lowest concentration of SARS-CoV-2 viral copies this assay can detect is 250 copies / mL. A negative result does not preclude SARS-CoV-2 infection and should not be used as the sole basis for treatment or other patient management decisions.  A negative result may occur with improper specimen collection / handling, submission of specimen other than nasopharyngeal swab, presence of viral mutation(s) within the areas targeted by this assay, and inadequate number of viral copies (<250 copies / mL). A negative result must be combined with clinical observations, patient history, and epidemiological information.  Fact Sheet for Patients:   StrictlyIdeas.no  Fact Sheet for Healthcare Providers: BankingDealers.co.za  This test is not yet approved or  cleared by the [place] FDA and has been authorized for detection and/or diagnosis of SARS-CoV-2 by FDA under an Emergency Use Authorization (EUA).  This EUA will remain in effect (meaning this test can be used) for the duration of the COVID-19 declaration under Section 564(b)(1) of the Act, 21 U.S.C. section 360bbb-3(b)(1), unless the authorization is terminated or revoked sooner.  Performed at [facility], [place] [address], [place], [place] [address]     Radiology Studies: No results found.   Jenna Ardoin T. [place]  If 7PM-7AM, please contact night-coverage [email or web address] 09/07/2019,  3:52 PM

## 2019-09-07 NOTE — Evaluation (Signed)
Physical Therapy Evaluation Patient Details Name: Nichole Munoz MRN: [id number] DOB: 24-Apr-1945 Today's Date: 09/07/2019   History of Present Illness  74 y.o. female  from [place] (speaks English but not very fluent) with medical history significant of diabetes, diabetic neuropathy, hypertension, hyperlipidemia, morbid obesity, history of renal retention and admitted for acute cystitis, pyelonephritis and gross hematuria  Clinical Impression  Pt admitted with above diagnosis.  Pt currently with functional limitations due to the deficits listed below (see PT Problem List). Pt will benefit from skilled PT to increase their independence and safety with mobility to allow discharge to the venue listed below.  Pt very independent at baseline. Pt uses RW and reports shopping in community.  Pt very motivated and eager to regain independence and d/c home.     Follow Up Recommendations No PT follow up    Equipment Recommendations  None recommended by PT    Recommendations for Other Services       Precautions / Restrictions Precautions Precautions: Fall      Mobility  Bed Mobility Overal bed mobility: Modified Independent                Transfers Overall transfer level: Needs assistance Equipment used: Rolling walker (2 wheeled) Transfers: Sit to/from Stand Sit to Stand: Min guard         General transfer comment: min/guard for safety  Ambulation/Gait Ambulation/Gait assistance: Min guard Gait Distance (Feet): 400 Feet Assistive device: Rolling walker (2 wheeled) Gait Pattern/deviations: Step-through pattern;Decreased stride length     General Gait Details: pt occasionally lifting RW, cues for use of RW and remaining within [employer] Mobility    Modified Rankin (Stroke Patients Only)       Balance Overall balance assessment:  (denies falls)                                           Pertinent Vitals/Pain Pain  Assessment: No/denies pain ((reports only pain is when she urinates))    Home Living Family/patient expects to be discharged to:: Private residence Living Arrangements:  (sister) Available Help at Discharge: Family;Available 24 hours/day Type of Home: House Home Access: Stairs to enter   [employer] of Steps: 6 Home Layout: One level Home Equipment: Environmental consultant - 2 wheels Additional Comments: granddaughter present and interpreted    Prior Function Level of Independence: Independent with assistive device(s)               Hand Dominance        Extremity/Trunk Assessment        Lower Extremity Assessment Lower Extremity Assessment: Overall WFL for tasks assessed    Cervical / Trunk Assessment Cervical / Trunk Assessment: Normal  Communication   Communication: Prefers language other than English  Cognition Arousal/Alertness: Awake/alert Behavior During Therapy: WFL for tasks assessed/performed Overall Cognitive Status: Within Functional Limits for tasks assessed                                        General Comments      Exercises     Assessment/Plan    PT Assessment Patient needs continued PT services  PT Problem List Decreased mobility;Decreased activity tolerance;Decreased knowledge of use of DME  PT Treatment Interventions DME instruction;Gait training;Therapeutic exercise;Functional mobility training;Therapeutic activities;Stair training;Patient/family education    PT Goals (Current goals can be found in the Care Plan section)  Acute Rehab PT Goals PT Goal Formulation: With patient Time For Goal Achievement: 09/21/19 Potential to Achieve Goals: Good    Frequency Min 3X/week   Barriers to discharge        Co-evaluation               AM-PAC PT "6 Clicks" Mobility  Outcome Measure Help needed turning from your back to your side while in a flat bed without using bedrails?: None Help needed moving from lying  on your back to sitting on the side of a flat bed without using bedrails?: A Little Help needed moving to and from a bed to a chair (including a wheelchair)?: A Little Help needed standing up from a chair using your arms (e.g., wheelchair or bedside chair)?: A Little Help needed to walk in hospital room?: A Little Help needed climbing 3-5 steps with a railing? : A Little 6 Click Score: 19    End of Session Equipment Utilized During Treatment: Gait belt Activity Tolerance: Patient tolerated treatment well Patient left: in chair;with call bell/phone within reach;with chair alarm set;with family/visitor present Nurse Communication: Mobility status PT Visit Diagnosis: Difficulty in walking, not elsewhere classified (R26.2)    Time: 6415-8309 PT Time Calculation (min) (ACUTE ONLY): 14 min   Charges:   PT Evaluation $PT Eval Low Complexity: 1 Low     Kati PT, DPT Acute Rehabilitation Services Pager: [phone] Office: [phone]  Trena Platt 09/07/2019, 4:46 PM

## 2019-09-08 DIAGNOSIS — B9620 Unspecified Escherichia coli [E. coli] as the cause of diseases classified elsewhere: Secondary | ICD-10-CM

## 2019-09-08 DIAGNOSIS — N390 Urinary tract infection, site not specified: Secondary | ICD-10-CM

## 2019-09-08 DIAGNOSIS — E876 Hypokalemia: Secondary | ICD-10-CM

## 2019-09-08 LAB — CBC
HCT: 29.2 % — ABNORMAL LOW (ref 36.0–46.0)
Hemoglobin: 9.4 g/dL — ABNORMAL LOW (ref 12.0–15.0)
MCH: 30.1 pg (ref 26.0–34.0)
MCHC: 32.2 g/dL (ref 30.0–36.0)
MCV: 93.6 fL (ref 80.0–100.0)
Platelets: 189 10*3/uL (ref 150–400)
RBC: 3.12 MIL/uL — ABNORMAL LOW (ref 3.87–5.11)
RDW: 14.6 % (ref 11.5–15.5)
WBC: 8.1 10*3/uL (ref 4.0–10.5)
nRBC: 0 % (ref 0.0–0.2)

## 2019-09-08 LAB — GLUCOSE, CAPILLARY
Glucose-Capillary: 50 mg/dL — ABNORMAL LOW (ref 70–99)
Glucose-Capillary: 139 mg/dL — ABNORMAL HIGH (ref 70–99)
Glucose-Capillary: 108 mg/dL — ABNORMAL HIGH (ref 70–99)

## 2019-09-08 LAB — URINE CULTURE: Culture: >=100000 — AB

## 2019-09-08 LAB — RENAL FUNCTION PANEL
Albumin: 2.7 g/dL — ABNORMAL LOW (ref 3.5–5.0)
Anion gap: 8 (ref 5–15)
BUN: 26 mg/dL — ABNORMAL HIGH (ref 8–23)
CO2: 23 mmol/L (ref 22–32)
Calcium: 8.3 mg/dL — ABNORMAL LOW (ref 8.9–10.3)
Chloride: 110 mmol/L (ref 98–111)
Creatinine, Ser: 1.34 mg/dL — ABNORMAL HIGH (ref 0.44–1.00)
GFR calc Af Amer: 45 mL/min — ABNORMAL LOW (ref >60)
GFR calc non Af Amer: 39 mL/min — ABNORMAL LOW (ref >60)
Glucose, Bld: 123 mg/dL — ABNORMAL HIGH (ref 70–99)
Phosphorus: 3.2 mg/dL (ref 2.5–4.6)
Potassium: 4.3 mmol/L (ref 3.5–5.1)
Sodium: 141 mmol/L (ref 135–145)

## 2019-09-08 LAB — MAGNESIUM
Magnesium: 1.1 mg/dL — ABNORMAL LOW (ref 1.7–2.4)
Magnesium: 2.7 mg/dL — ABNORMAL HIGH (ref 1.7–2.4)

## 2019-09-08 MED ORDER — LEVOFLOXACIN 750 MG PO TABS: 750.0000 mg | ORAL_TABLET | ORAL | 0 refills | Status: AC

## 2019-09-08 MED ORDER — ACETAMINOPHEN 500 MG PO TABS: 1000.0000 mg | ORAL_TABLET | Freq: Three times a day (TID) | ORAL | 2 refills | Status: AC | PRN

## 2019-09-08 MED ORDER — MAGNESIUM SULFATE 4 GM/100ML IV SOLN
4.0000 g | Freq: Once | INTRAVENOUS | Status: AC
  Administered 2019-09-08: 09:00:00 4 g via INTRAVENOUS
  Filled 2019-09-08: qty 100

## 2019-09-08 NOTE — Discharge Summary (Signed)
Physician Discharge Summary  Nichole Munoz SVX:[id number] DOB: 09-03-1945 DOA: 09/04/2019  PCP: [facility]  Admit date: 09/04/2019 Discharge date: 09/08/2019  Admitted From: Home Disposition: Home  Recommendations for Outpatient Follow-up:  1. Follow ups as below. 2. Please obtain CBC/BMP/Mag at follow up 3. Please follow up on the following pending results: None  Home Health: None required Equipment/Devices: None required  Discharge Condition: Stable CODE STATUS: Full code   [place], [facility]. Schedule an appointment as soon as possible for a visit in 1 week(s).   Specialty: Pediatrics Contact information: [place] [address] [phone]        Robley Fries, MD. Schedule an appointment as soon as possible for a visit in 2 week(s).   Specialty: Urology Contact information: [address] [place] [address] [phone]               Hospital Course: 74 y.o.[place]em[place]rom [place] (speaks [place] but not very fluent) with medical history significant ofdiabetes, diabetic neuropathy, hypertension, hyperlipidemia, morbid obesity, history of renal retention who presented with abdominal pain and left flank pain which has been going on for above 4 to 5 days but getting worse. Family also noted dark urine and rate with evidence of bleeding. Denied any fever or chills.  ED Course: Hemodynamically stable.  Afebrile.  Normal saturation.  Glucose 237.  Creatinine 1.51.  CBC normal. UA somewhat concerning. CT renal stone shows mild left-sided hydronephrosis with findings suggestive of left-sided pyelonephritis or ureteritis. No renal or collecting system calculi. She also has suggestive of diffuse acute cystitis.  Started on IV ceftriaxone.   Urology consulted due to hematuria, and recommended repeat ultrasound in 1 to 2 weeks and PVR.  Urine culture grew  pansensitive E. Coli.  Patient's symptoms and hematuria basically resolved.  Discharged on p.o. Levaquin 750 mg every other day, #3 to complete treatment course.  Patient was evaluated by therapy and no need was identified.  Video interpreter with ID number [id number] utilized for this encounter.  Patient's son updated about plan over the phone.  See individual problems below for more on hospital course.  Discharge Diagnoses:  Pansensitive E. coli UTI/pyelonephritis with gross hematuria and left hydronephrosis: Symptoms resolved. -IV ceftriaxone 8/29-9/2.  P.o. Levaquin 750 mg every other day, #3. -Outpatient follow-up with urology for repeat ultrasound in 1 to 2 weeks  Gross hematuriadue to cystitis. No definite ureteral calculi noted on CT. Urine cleared.  H&H stable. Mild left hydronephrosis -Treat infection as above -Urology recommended repeat ultrasound in 1 to 2 weeks to ensure resolution   AKI on CKD-3A: Likely prerenal from dehydration. Cr 1.5 (admit)> 1.7> 1.34.  Baseline 1.0-1.4. -Discontinued meloxicam. -Recheck renal function in 1 week  Uncontrolled DM-2 with hyperglycemia: A1c 8.1%. -Discharged on home medications.  Essential hypertension: Normotensive for most part. -Continue home amlodipine.  Hypokalemia/hypomagnesemia: Resolved..   Language barrier affecting communication -Utilized video interpreter with ID number [id number]  Body mass index is 22.06 kg/m.            Discharge Exam: Vitals:   09/07/19 2205 09/08/19 0622  BP: (!) 111/97 130/79  Pulse: 96 86  Resp: 15 15  Temp: 100.2 F (37.9 C) 98.8 F (37.1 C)  SpO2: 96% 96%    GENERAL: No apparent distress.  Nontoxic. HEENT: MMM.  Vision and hearing grossly intact.  NECK: Supple.  No apparent JVD.  RESP:  No IWOB.  Fair aeration bilaterally. CVS:  RRR. Heart sounds normal.  ABD/GI/GU: Bowel sounds present. Soft. Non tender.  No CVA tenderness. MSK/EXT:  Moves extremities. No apparent  deformity. No edema.  SKIN: no apparent skin lesion or wound NEURO: Awake, alert and oriented appropriately.  No apparent focal neuro deficit. PSYCH: Calm. Normal affect.  Discharge Instructions  Discharge Instructions    Call MD for:  extreme fatigue   Complete by: As directed    Call MD for:  persistant dizziness or light-headedness   Complete by: As directed    Call MD for:  persistant nausea and vomiting   Complete by: As directed    Call MD for:  severe uncontrolled pain   Complete by: As directed    Call MD for:  temperature >100.4   Complete by: As directed    Diet - low sodium heart healthy   Complete by: As directed    Diet Carb Modified   Complete by: As directed    Increase activity slowly   Complete by: As directed      Allergies as of 09/08/2019      Reactions   Other Other (See Comments)   Chicken causes itching/  clorox, Bleach causes shortness of breath   No Known Allergies    Previous listing of unable to tolerate SMELL of Clorox and Chicken REACTION NOT AN ALLERGY       Medication List    STOP taking these medications   meloxicam 7.5 MG tablet Commonly known as: MOBIC     TAKE these medications   acetaminophen 500 MG tablet Commonly known as: TYLENOL Take 2 tablets (1,000 mg total) by mouth every 8 (eight) hours as needed for mild pain or moderate pain. Notes to patient: Do not take more than 3000 mg in 24 hours   albuterol 108 (90 Base) MCG/ACT inhaler Commonly known as: VENTOLIN HFA Inhale 2 puffs into the lungs every 6 (six) hours as needed for wheezing or shortness of breath.   amLODipine 5 MG tablet Commonly known as: NORVASC Take 1 tablet (5 mg total) by mouth daily.   aspirin 81 MG EC tablet Take 1 tablet (81 mg total) by mouth daily.   atorvastatin 40 MG tablet Commonly known as: LIPITOR Take 1 tablet (40 mg total) by mouth daily.   blood glucose meter kit and supplies Dispense based on patient and insurance preference. Use up to  four times daily as directed. (FOR ICD-10 E10.9, E11.9).   [employer] Iron 325 (65 FE) MG tablet Generic drug: ferrous sulfate Take 325 mg by mouth daily.   diclofenac Sodium 1 % Gel Commonly known as: Voltaren Apply 2 g topically 4 (four) times daily. What changed:   when to take this  reasons to take this   gabapentin 300 MG capsule Commonly known as: NEURONTIN Take 300 mg by mouth 2 (two) times daily. What changed: Another medication with the same name was removed. Continue taking this medication, and follow the directions you see here.   glipiZIDE 5 MG tablet Commonly known as: GLUCOTROL Take 5 mg by mouth 2 (two) times daily.   insulin glargine 100 UNIT/ML injection Commonly known as: LANTUS Inject 0.2 mLs (20 Units total) into the skin 2 (two) times daily.   Insulin Pen Needle 31G X 5 MM Misc Use up to 4 times daily with insulin   levofloxacin 750 MG tablet Commonly known as: Levaquin Take 1 tablet (750 mg total) by mouth every other day  for 6 days. Notes to patient: Treats infection Drink extra fluids so you will urinate more often and help prevent kidney problems This medicine may make your skin more sensitive to sunlight. Wear sunscreen. Do not use sunlamps or tanning beds.   lisinopril 10 MG tablet Commonly known as: ZESTRIL Take 10 mg by mouth daily.   pantoprazole 40 MG tablet Commonly known as: PROTONIX Take 1 tablet (40 mg total) by mouth at bedtime.       Consultations:  Urology  Procedures/Studies:   US RENAL  Result Date: 09/06/2019 CLINICAL DATA:  Acute renal insufficiency. EXAM: RENAL / URINARY TRACT ULTRASOUND COMPLETE COMPARISON:  CT 09/04/2019 FINDINGS: Right Kidney: Renal measurements: 9.7 x 6.7 x 4.9 cm = volume: 154 mL. Development of moderate right-sided hydronephrosis. Normal renal cortical thickening. Left Kidney: Renal measurements: 11.3 x 7.4 x 4.8 cm = volume: 212 mL. Persistent moderate left-sided hydroureteronephrosis. Normal renal  cortical thickening. Bladder: Diffusely thickened bladder wall. Bilateral ureteric jets identified. Other: None. IMPRESSION: Persistent moderate left and development of moderate right-sided hydronephrosis since the 09/04/2019 CT. Bladder wall thickening, consistent with cystitis. Electronically Signed   By: Abigail Miyamoto M.D.   On: 09/06/2019 15:38   CT Renal Stone Study  Result Date: 09/04/2019 CLINICAL DATA:  Flank pain hematuria EXAM: CT ABDOMEN AND PELVIS WITHOUT CONTRAST TECHNIQUE: Multidetector CT imaging of the abdomen and pelvis was performed following the standard protocol without IV contrast. COMPARISON:  February 18, 2018 FINDINGS: Lower chest: The visualized heart size within normal limits. No pericardial fluid/thickening. No hiatal hernia. The visualized portions of the lungs are clear. Hepatobiliary: Although limited due to the lack of intravenous contrast, normal in appearance without gross focal abnormality. No evidence of calcified gallstones or biliary ductal dilatation. Pancreas:  Unremarkable.  No surrounding inflammatory changes. Spleen: Normal in size. Although limited due to the lack of intravenous contrast, normal in appearance. Adrenals/Urinary Tract: Both adrenal glands appear normal. Mild left-sided pelvicaliectasis and ureterectasis with urothelial wall thickening down to the level of the UVJ. There is also mild left-sided perinephric and periureteral stranding. There is diffuse bladder wall thickening and surrounding inflammatory changes. No renal or collecting system calculi are seen. Stomach/Bowel: The stomach, small bowel, and colon are normal in appearance. No inflammatory changes or obstructive findings. Scattered colonic diverticula are noted. Vascular/Lymphatic: There are no enlarged abdominal or pelvic lymph nodes. Scattered aortic atherosclerotic calcifications are seen without aneurysmal dilatation. Reproductive: The uterus and adnexa are unremarkable. Other: No evidence of  abdominal wall mass or hernia. Musculoskeletal: No acute or significant osseous findings. 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 IMPRESSION: Mild left-sided hydronephrosis with findings suggestive of left-sided pyelonephritis/ureteritis. No renal or collecting system calculi Findings suggestive of diffuse acute cystitis. Aortic Atherosclerosis (ICD10-I70.0). Electronically Signed   By: Prudencio Pair M.D.   On: 09/04/2019 22:02        The results of significant diagnostics from this hospitalization (including imaging, microbiology, ancillary and laboratory) are listed below for reference.     Microbiology: Recent Results (from the past 240 hour(s))  SARS Coronavirus 2 by RT PCR (hospital order, performed in [facility] hospital lab) Nasopharyngeal Nasopharyngeal Swab     Status: None   Collection Time: 09/04/19 11:01 PM   Specimen: Nasopharyngeal Swab  Result Value Ref Range Status   SARS Coronavirus 2 NEGATIVE NEGATIVE Final    Comment: (NOTE) SARS-CoV-2 target nucleic acids are NOT DETECTED.  The SARS-CoV-2 RNA is generally  detectable in upper and lower respiratory specimens during the acute phase of infection. The lowest concentration of SARS-CoV-2 viral copies this assay can detect is 250 copies / mL. A negative result does not preclude SARS-CoV-2 infection and should not be used as the sole basis for treatment or other patient management decisions.  A negative result may occur with improper specimen collection / handling, submission of specimen other than nasopharyngeal swab, presence of viral mutation(s) within the areas targeted by this assay, and inadequate number of viral copies (<250 copies / mL). A negative result must be combined with clinical observations, patient history, and epidemiological information.  Fact Sheet for Patients:   StrictlyIdeas.no  Fact Sheet for Healthcare  Providers: BankingDealers.co.za  This test is not yet approved or  cleared by the [place] FDA and has been authorized for detection and/or diagnosis of SARS-CoV-2 by FDA under an Emergency Use Authorization (EUA).  This EUA will remain in effect (meaning this test can be used) for the duration of the COVID-19 declaration under Section 564(b)(1) of the Act, 21 U.S.C. section 360bbb-3(b)(1), unless the authorization is terminated or revoked sooner.  Performed at [facility], [place] [address] [place], [place] [address]   Culture, Urine     Status: Abnormal   Collection Time: 09/06/19 12:16 PM   Specimen: Urine, Clean Catch  Result Value Ref Range Status   Specimen Description   Final    URINE, CLEAN CATCH Performed at [facility], [place] [address] [place], [place] [address]    Special Requests   Final    NONE Performed at [facility], [place] [address], [place], [place] [address]    Culture >=100,000 COLONIES/mL ESCHERICHIA COLI (A)  Final   Report Status 09/08/2019 FINAL  Final   Organism ID, Bacteria ESCHERICHIA COLI (A)  Final      Susceptibility   Escherichia coli - MIC*    AMPICILLIN <=2 SENSITIVE Sensitive     CEFAZOLIN <=4 SENSITIVE Sensitive     CEFTRIAXONE <=0.25 SENSITIVE Sensitive     CIPROFLOXACIN <=0.25 SENSITIVE Sensitive     GENTAMICIN <=1 SENSITIVE Sensitive     IMIPENEM <=0.25 SENSITIVE Sensitive     NITROFURANTOIN 32 SENSITIVE Sensitive     TRIMETH/SULFA <=20 SENSITIVE Sensitive     AMPICILLIN/SULBACTAM <=2 SENSITIVE Sensitive     PIP/TAZO <=4 SENSITIVE Sensitive     * >=100,000 COLONIES/mL ESCHERICHIA COLI     Labs: BNP (last 3 results) No results for input(s): BNP in the last 8760 hours. Basic Metabolic Panel: Recent Labs  Lab 09/04/19 2100 09/05/19 0612 09/06/19 0520 09/07/19 0517 09/08/19 0532 09/08/19 1140  NA 141 142 135 143 141  --   K 4.2 3.2* 5.1 4.4 4.3   --   CL 101 104 103 109 110  --   CO2 _0 --   GLUCOSE 237* 124* 262* 75 123*  --   BUN 47* 38* 41* 33* 26*  --   CREATININE 1.51* 1.28* 1.75* 1.33* 1.34*  --   CALCIUM 9.2 9.1 7.9* 8.3* 8.3*  --   MG  --   --   --   --  1.1* 2.7*  PHOS  --   --   --   --  3.2  --    Liver Function Tests: Recent Labs  Lab 09/04/19 2100 09/05/19 0612 09/08/19 0532  AST 24 19  --   ALT 15 15  --  ALKPHOS 109 110  --   BILITOT 1.0 0.7  --   PROT 7.9 8.3*  --   ALBUMIN 3.8 3.7 2.7*   Recent Labs  Lab 09/04/19 2100  LIPASE 34   No results for input(s): AMMONIA in the last 168 hours. CBC: Recent Labs  Lab 09/04/19 2100 09/04/19 2100 09/05/19 0612 09/05/19 1807 09/06/19 0520 09/06/19 1233 09/06/19 1722 09/07/19 0517 09/08/19 0532  WBC 9.5  --  12.1*  --   --  12.0*  --  7.7 8.1  HGB 12.0   < > 13.0   < > 9.3* 11.5* 11.1* 9.7* 9.4*  HCT 36.2   < > 39.6   < > 28.9* 35.4* 33.5* 30.3* 29.2*  MCV 89.8  --  90.6  --   --  92.9  --  93.2 93.6  PLT 225  --  252  --   --  199  --  179 189   < > = values in this interval not displayed.   Cardiac Enzymes: No results for input(s): CKTOTAL, CKMB, CKMBINDEX, TROPONINI in the last 168 hours. BNP: Invalid input(s): POCBNP CBG: Recent Labs  Lab 09/07/19 1644 09/07/19 2223 09/08/19 0747 09/08/19 1026 09/08/19 1144  GLUCAP 117* 185* 50* 139* 108*   D-Dimer No results for input(s): DDIMER in the last 72 hours. Hgb A1c No results for input(s): HGBA1C in the last 72 hours. Lipid Profile No results for input(s): CHOL, HDL, LDLCALC, TRIG, CHOLHDL, LDLDIRECT in the last 72 hours. Thyroid function studies No results for input(s): TSH, T4TOTAL, T3FREE, THYROIDAB in the last 72 hours.  Invalid input(s): FREET3 Anemia work up No results for input(s): VITAMINB12, FOLATE, FERRITIN, TIBC, IRON, RETICCTPCT in the last 72 hours. Urinalysis    Component Value Date/Time   COLORURINE YELLOW 09/04/2019 2018   APPEARANCEUR CLOUDY (A)  09/04/2019 2018   LABSPEC 1.014 09/04/2019 2018   PHURINE 6.0 09/04/2019 2018   GLUCOSEU >=500 (A) 09/04/2019 2018   HGBUR LARGE (A) 09/04/2019 2018   [place] NEGATIVE 09/04/2019 2018   KETONESUR NEGATIVE 09/04/2019 2018   PROTEINUR >=300 (A) 09/04/2019 2018   UROBILINOGEN 1.0 09/23/2014 1458   NITRITE NEGATIVE 09/04/2019 2018   LEUKOCYTESUR LARGE (A) 09/04/2019 2018   Sepsis Labs Invalid input(s): PROCALCITONIN,  WBC,  LACTICIDVEN   Time coordinating discharge: 35 minutes  SIGNED:  Mercy Riding, MD  [facility] 09/08/2019, 2:33 PM  If 7PM-7AM, please contact night-coverage [email or web address]

## 2020-01-30 ENCOUNTER — Emergency Department (HOSPITAL_COMMUNITY): Payer: Medicare Other

## 2020-01-30 ENCOUNTER — Emergency Department (HOSPITAL_COMMUNITY)
Admission: EM | Admit: 2020-01-30 | Discharge: 2020-01-31 | Disposition: A | Discharge status: Home / Self Care | Payer: Medicare Other | Attending: Emergency Medicine | Admitting: Emergency Medicine

## 2020-01-30 ENCOUNTER — Other Ambulatory Visit: Payer: Self-pay

## 2020-01-30 ENCOUNTER — Encounter (HOSPITAL_COMMUNITY): Payer: Self-pay

## 2020-01-30 DIAGNOSIS — J189 Pneumonia, unspecified organism: Secondary | ICD-10-CM | POA: Insufficient documentation

## 2020-01-30 DIAGNOSIS — Z794 Long term (current) use of insulin: Secondary | ICD-10-CM | POA: Diagnosis not present

## 2020-01-30 DIAGNOSIS — Z7984 Long term (current) use of oral hypoglycemic drugs: Secondary | ICD-10-CM | POA: Diagnosis not present

## 2020-01-30 DIAGNOSIS — Z79899 Other long term (current) drug therapy: Secondary | ICD-10-CM | POA: Insufficient documentation

## 2020-01-30 DIAGNOSIS — N183 Chronic kidney disease, stage 3 unspecified: Secondary | ICD-10-CM | POA: Insufficient documentation

## 2020-01-30 DIAGNOSIS — J45909 Unspecified asthma, uncomplicated: Secondary | ICD-10-CM | POA: Insufficient documentation

## 2020-01-30 DIAGNOSIS — E1140 Type 2 diabetes mellitus with diabetic neuropathy, unspecified: Secondary | ICD-10-CM | POA: Diagnosis not present

## 2020-01-30 DIAGNOSIS — Z20822 Contact with and (suspected) exposure to covid-19: Secondary | ICD-10-CM | POA: Diagnosis not present

## 2020-01-30 DIAGNOSIS — M25552 Pain in left hip: Secondary | ICD-10-CM | POA: Diagnosis present

## 2020-01-30 DIAGNOSIS — Z7982 Long term (current) use of aspirin: Secondary | ICD-10-CM | POA: Insufficient documentation

## 2020-01-30 DIAGNOSIS — M25551 Pain in right hip: Secondary | ICD-10-CM | POA: Insufficient documentation

## 2020-01-30 DIAGNOSIS — E1122 Type 2 diabetes mellitus with diabetic chronic kidney disease: Secondary | ICD-10-CM | POA: Insufficient documentation

## 2020-01-30 DIAGNOSIS — I129 Hypertensive chronic kidney disease with stage 1 through stage 4 chronic kidney disease, or unspecified chronic kidney disease: Secondary | ICD-10-CM | POA: Insufficient documentation

## 2020-01-30 LAB — CBC
HCT: 35.9 % — ABNORMAL LOW (ref 36.0–46.0)
Hemoglobin: 12 g/dL (ref 12.0–15.0)
MCH: 30.3 pg (ref 26.0–34.0)
MCHC: 33.4 g/dL (ref 30.0–36.0)
MCV: 90.7 fL (ref 80.0–100.0)
Platelets: 228 10*3/uL (ref 150–400)
RBC: 3.96 MIL/uL (ref 3.87–5.11)
RDW: 11.7 % (ref 11.5–15.5)
WBC: 9.7 10*3/uL (ref 4.0–10.5)
nRBC: 0 % (ref 0.0–0.2)

## 2020-01-30 LAB — COMPREHENSIVE METABOLIC PANEL
ALT: 15 U/L (ref 0–44)
AST: 24 U/L (ref 15–41)
Albumin: 4 g/dL (ref 3.5–5.0)
Alkaline Phosphatase: 91 U/L (ref 38–126)
Anion gap: 11 (ref 5–15)
BUN: 38 mg/dL — ABNORMAL HIGH (ref 8–23)
CO2: 26 mmol/L (ref 22–32)
Calcium: 8.7 mg/dL — ABNORMAL LOW (ref 8.9–10.3)
Chloride: 104 mmol/L (ref 98–111)
Creatinine, Ser: 1.7 mg/dL — ABNORMAL HIGH (ref 0.44–1.00)
GFR, Estimated: 31 mL/min — ABNORMAL LOW (ref >60)
Glucose, Bld: 57 mg/dL — ABNORMAL LOW (ref 70–99)
Potassium: 3.8 mmol/L (ref 3.5–5.1)
Sodium: 141 mmol/L (ref 135–145)
Total Bilirubin: 0.5 mg/dL (ref 0.3–1.2)
Total Protein: 8 g/dL (ref 6.5–8.1)

## 2020-01-30 LAB — SARS CORONAVIRUS 2 BY RT PCR (HOSPITAL ORDER, PERFORMED IN ~~LOC~~ HOSPITAL LAB): SARS Coronavirus 2: NEGATIVE

## 2020-01-30 LAB — LIPASE, BLOOD: Lipase: 46 U/L (ref 11–51)

## 2020-01-30 MED ORDER — CEFTRIAXONE SODIUM 1 G IJ SOLR
1.0000 g | Freq: Once | INTRAMUSCULAR | Status: AC
  Administered 2020-01-30: 1 g via INTRAMUSCULAR
  Filled 2020-01-30: qty 10

## 2020-01-30 MED ORDER — SODIUM CHLORIDE 0.9 % IV SOLN
500.0000 mg | Freq: Once | INTRAVENOUS | Status: AC
  Administered 2020-01-30: 500 mg via INTRAVENOUS
  Filled 2020-01-30: qty 500

## 2020-01-30 MED ORDER — STERILE WATER FOR INJECTION IJ SOLN
INTRAMUSCULAR | Status: AC
  Filled 2020-01-30: qty 10

## 2020-01-30 MED ORDER — ACETAMINOPHEN 325 MG PO TABS
650.0000 mg | ORAL_TABLET | Freq: Once | ORAL | Status: AC | PRN
  Administered 2020-01-30: 650 mg via ORAL
  Filled 2020-01-30: qty 2

## 2020-01-30 NOTE — Discharge Instructions (Addendum)
Call your primary care doctor or specialist as discussed in the next 2-3 days.   Return immediately back to the ER if:  Your symptoms worsen within the next 12-24 hours. You develop new symptoms such as new fevers, persistent vomiting, new pain, shortness of breath, or new weakness or numbness, or if you have any other concerns.  

## 2020-01-30 NOTE — ED Triage Notes (Signed)
Per EMS- Patient is from home and lives with the son. Patient speaks [place]. Patient is from [place].  Patient c/o dizziness, N/V x 4 days.  Patient was given Dextrose 10 for CBG- 70 and Zofran 4 mg IV prior to arrival to the ED.

## 2020-01-30 NOTE — ED Notes (Signed)
Unable to fully assess the patient. Patient  speaks [place]. Patient is from [place]. Language video interpreter does not have that language.
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
## 2021-01-14 ENCOUNTER — Emergency Department (HOSPITAL_COMMUNITY): Payer: Medicare Other

## 2021-01-14 ENCOUNTER — Emergency Department (HOSPITAL_COMMUNITY)
Admission: EM | Admit: 2021-01-14 | Discharge: 2021-01-15 | Disposition: A | Discharge status: Home / Self Care | Payer: Medicare Other | Attending: Emergency Medicine | Admitting: Emergency Medicine

## 2021-01-14 ENCOUNTER — Encounter (HOSPITAL_COMMUNITY): Payer: Self-pay | Admitting: Emergency Medicine

## 2021-01-14 DIAGNOSIS — A419 Sepsis, unspecified organism: Secondary | ICD-10-CM

## 2021-01-14 DIAGNOSIS — R1030 Lower abdominal pain, unspecified: Secondary | ICD-10-CM | POA: Insufficient documentation

## 2021-01-14 DIAGNOSIS — Z7982 Long term (current) use of aspirin: Secondary | ICD-10-CM | POA: Diagnosis not present

## 2021-01-14 DIAGNOSIS — Z20822 Contact with and (suspected) exposure to covid-19: Secondary | ICD-10-CM | POA: Insufficient documentation

## 2021-01-14 DIAGNOSIS — N390 Urinary tract infection, site not specified: Secondary | ICD-10-CM | POA: Insufficient documentation

## 2021-01-14 DIAGNOSIS — R55 Syncope and collapse: Secondary | ICD-10-CM | POA: Insufficient documentation

## 2021-01-14 LAB — CBC WITH DIFFERENTIAL/PLATELET
Abs Immature Granulocytes: 0.09 10*3/uL — ABNORMAL HIGH (ref 0.00–0.07)
Basophils Absolute: 0.1 10*3/uL (ref 0.0–0.1)
Basophils Relative: 0 %
Eosinophils Absolute: 0.3 10*3/uL (ref 0.0–0.5)
Eosinophils Relative: 2 %
HCT: 37.1 % (ref 36.0–46.0)
Hemoglobin: 12.7 g/dL (ref 12.0–15.0)
Immature Granulocytes: 1 %
Lymphocytes Relative: 15 %
Lymphs Abs: 2.4 10*3/uL (ref 0.7–4.0)
MCH: 29.9 pg (ref 26.0–34.0)
MCHC: 34.2 g/dL (ref 30.0–36.0)
MCV: 87.3 fL (ref 80.0–100.0)
Monocytes Absolute: 0.9 10*3/uL (ref 0.1–1.0)
Monocytes Relative: 6 %
Neutro Abs: 12.1 10*3/uL — ABNORMAL HIGH (ref 1.7–7.7)
Neutrophils Relative %: 76 %
Platelets: 250 10*3/uL (ref 150–400)
RBC: 4.25 MIL/uL (ref 3.87–5.11)
RDW: 11.8 % (ref 11.5–15.5)
WBC: 15.8 10*3/uL — ABNORMAL HIGH (ref 4.0–10.5)
nRBC: 0 % (ref 0.0–0.2)

## 2021-01-14 LAB — PROTIME-INR
INR: 0.9 (ref 0.8–1.2)
Prothrombin Time: 12.6 seconds (ref 11.4–15.2)

## 2021-01-14 LAB — COMPREHENSIVE METABOLIC PANEL
ALT: 22 U/L (ref 0–44)
AST: 27 U/L (ref 15–41)
Albumin: 3.8 g/dL (ref 3.5–5.0)
Alkaline Phosphatase: 97 U/L (ref 38–126)
Anion gap: 11 (ref 5–15)
BUN: 25 mg/dL — ABNORMAL HIGH (ref 8–23)
CO2: 26 mmol/L (ref 22–32)
Calcium: 8.8 mg/dL — ABNORMAL LOW (ref 8.9–10.3)
Chloride: 98 mmol/L (ref 98–111)
Creatinine, Ser: 1.25 mg/dL — ABNORMAL HIGH (ref 0.44–1.00)
GFR, Estimated: 45 mL/min — ABNORMAL LOW (ref >60)
Glucose, Bld: 293 mg/dL — ABNORMAL HIGH (ref 70–99)
Potassium: 3.9 mmol/L (ref 3.5–5.1)
Sodium: 135 mmol/L (ref 135–145)
Total Bilirubin: 0.6 mg/dL (ref 0.3–1.2)
Total Protein: 8.4 g/dL — ABNORMAL HIGH (ref 6.5–8.1)

## 2021-01-14 LAB — LACTIC ACID, PLASMA: Lactic Acid, Venous: 1.7 mmol/L (ref 0.5–1.9)

## 2021-01-14 LAB — LIPASE, BLOOD: Lipase: 82 U/L — ABNORMAL HIGH (ref 11–51)

## 2021-01-14 LAB — TROPONIN I (HIGH SENSITIVITY): Troponin I (High Sensitivity): 18 ng/L — ABNORMAL HIGH (ref <18)

## 2021-01-14 LAB — APTT: aPTT: 28 seconds (ref 24–36)

## 2021-01-14 NOTE — ED Provider Triage Note (Signed)
Emergency Medicine Provider Triage Evaluation Note  Nichole Munoz , a 76 y.o. female  was evaluated in triage.  Pt complains of generalized weakness, nausea, vomiting, diarrhea, and syncope.  Patient reports that over the last few days she has had nausea, vomiting, and diarrhea.  Patient also endorses sore throat, cough, generalized body aches, and headache.  Patient states she became very dizzy and fell down.  Per triage note family reports that patient was sitting in a chair which had a single episode and fell to the floor.  [place] translator was used to conduct this interview  Review of Systems  Positive: Generalized weakness, nausea, vomiting, diarrhea, syncope, headache, generalized myalgia, cough Negative: Numbness, weakness, abdominal pain  Physical Exam  BP (!) 154/76 (BP Location: Left Arm)    Pulse (!) 102    Temp 99.4 F (37.4 C) (Oral)    Resp 15    SpO2 92%  Gen:   Awake, no distress   Resp:  Normal effort, lungs clear to auscultation bilaterally MSK:   Moves extremities without difficulty  Other:  No midline tenderness or deformity to cervical, thoracic, or lumbar spine.  Abdomen soft, nondistended, nontender.  Medical Decision Making  Medically screening exam initiated at 2:59 PM.  Appropriate orders placed.  Nichole Munoz was informed that the remainder of the evaluation will be completed by another provider, this initial triage assessment does not replace that evaluation, and the importance of remaining in the ED until their evaluation is complete.     Loni Beckwith, [place] 01/14/21 1502

## 2021-01-14 NOTE — ED Triage Notes (Addendum)
Patient from home BIB [facility] for evaluation of generalized weakness x2 days with new nausea, vomiting, and diarrhea that started today. Family states that patient was walking to the bathroom today when she started to feel weak, sat in a chair, then lost consciousness and fell to the floor from the chair. Patient received 4mg  zofran PTA from EMS. Patient alert, oriented, and in no apparent distress at this time.  EMS vitals HR 101 BP 159/102 RR 18 SpO2 100% on room air CBG 385

## 2021-01-15 ENCOUNTER — Other Ambulatory Visit: Payer: Self-pay

## 2021-01-15 DIAGNOSIS — R55 Syncope and collapse: Secondary | ICD-10-CM | POA: Diagnosis not present

## 2021-01-15 LAB — URINALYSIS, MICROSCOPIC (REFLEX): WBC, UA: >50 WBC/hpf (ref 0–5)

## 2021-01-15 LAB — URINALYSIS, ROUTINE W REFLEX MICROSCOPIC
Bilirubin Urine: NEGATIVE
Glucose, UA: 250 mg/dL — AB
Ketones, ur: NEGATIVE mg/dL
Nitrite: NEGATIVE
Protein, ur: 100 mg/dL — AB
Specific Gravity, Urine: >1.03 — ABNORMAL HIGH (ref 1.005–1.030)
pH: 5.5 (ref 5.0–8.0)

## 2021-01-15 LAB — TROPONIN I (HIGH SENSITIVITY): Troponin I (High Sensitivity): 18 ng/L — ABNORMAL HIGH (ref <18)

## 2021-01-15 LAB — RESP PANEL BY RT-PCR (FLU A&B, COVID) ARPGX2
Influenza A by PCR: NEGATIVE
Influenza B by PCR: NEGATIVE
SARS Coronavirus 2 by RT PCR: NEGATIVE

## 2021-01-15 MED ORDER — SODIUM CHLORIDE 0.9 % IV BOLUS
1000.0000 mL | Freq: Once | INTRAVENOUS | Status: AC
  Administered 2021-01-15: 1000 mL via INTRAVENOUS

## 2021-01-15 MED ORDER — SODIUM CHLORIDE 0.9 % IV SOLN
1.0000 g | Freq: Once | INTRAVENOUS | Status: AC
  Administered 2021-01-15: 1 g via INTRAVENOUS
  Filled 2021-01-15: qty 10

## 2021-01-15 MED ORDER — CEPHALEXIN 500 MG PO CAPS: 500.0000 mg | ORAL_CAPSULE | Freq: Two times a day (BID) | ORAL | 0 refills | Status: AC

## 2021-01-15 NOTE — ED Notes (Signed)
Pt requesting her IV be removed, Removed rt. AC

## 2021-01-15 NOTE — ED Provider Notes (Signed)
[facility] EMERGENCY DEPARTMENT Provider Note   CSN: [id number] Arrival date & time: 01/14/21  1436     History  Chief Complaint  Patient presents with   Loss of Consciousness    Nichole Munoz is a 76 y.o. female.  HPI Adult female arrives after an episode of dysuria, lower abdominal pain, and possible brief loss of consciousness.  Patient is awake, alert, providing her history with assistance of translation from device and her son.  Additional details are obtained by nursing who saw the patient when she arrived before my initial evaluation. Seemingly the patient is generally well was so until the past today when she developed some dysuria, lower abdominal pain, and after arising from an episode of urination had a period of possible loss of consciousness.  No fall, no trauma    Home Medications Prior to Admission medications   Medication Sig Start Date End Date Taking? Authorizing Provider  albuterol (PROVENTIL HFA;VENTOLIN HFA) 108 (90 Base) MCG/ACT inhaler Inhale 2 puffs into the lungs every 6 (six) hours as needed for wheezing or shortness of breath. 02/21/18   Nita Sells, MD  amLODipine (NORVASC) 5 MG tablet Take 1 tablet (5 mg total) by mouth daily. 04/28/19   Little Ishikawa, MD  aspirin EC 81 MG EC tablet Take 1 tablet (81 mg total) by mouth daily. 04/28/19   Little Ishikawa, MD  atorvastatin (LIPITOR) 40 MG tablet Take 1 tablet (40 mg total) by mouth daily. 04/28/19   Little Ishikawa, MD  blood glucose meter kit and supplies Dispense based on patient and insurance preference. Use up to four times daily as directed. (FOR ICD-10 E10.9, E11.9). 01/26/17   Dessa Phi, DO  [employer] IRON 325 (65 Fe) MG tablet Take 325 mg by mouth daily. 08/12/19   [provider]  diclofenac Sodium (VOLTAREN) 1 % GEL Apply 2 g topically 4 (four) times daily. Patient taking differently: Apply 2 g topically 4 (four) times daily as needed (pain).  02/19/19    Khatri, Hina, PA-C  gabapentin (NEURONTIN) 300 MG capsule Take 300 mg by mouth 2 (two) times daily. 08/12/19   [provider]  glipiZIDE (GLUCOTROL) 5 MG tablet Take 5 mg by mouth 2 (two) times daily. 07/31/19   [provider]  insulin glargine (LANTUS) 100 UNIT/ML injection Inject 0.2 mLs (20 Units total) into the skin 2 (two) times daily. 04/28/19 07/27/19  Little Ishikawa, MD  Insulin Pen Needle 31G X 5 MM MISC Use up to 4 times daily with insulin 01/26/17   Dessa Phi, DO  lisinopril (ZESTRIL) 10 MG tablet Take 10 mg by mouth daily. 07/31/19   [provider]  pantoprazole (PROTONIX) 40 MG tablet Take 1 tablet (40 mg total) by mouth at bedtime. 04/28/19   Little Ishikawa, MD      Allergies    Other and No known allergies    Review of Systems   Review of Systems  Constitutional:        Per HPI, otherwise negative  HENT:         Per HPI, otherwise negative  Respiratory:         Per HPI, otherwise negative  Cardiovascular:        Per HPI, otherwise negative  Gastrointestinal:  Negative for vomiting.  Endocrine:       Negative aside from HPI  Genitourinary:        Neg aside from HPI   Musculoskeletal:  Per HPI, otherwise negative  Skin: Negative.   Neurological:  Negative for syncope.   Physical Exam Updated Vital Signs BP (!) 147/85    Pulse 91    Temp 98.6 F (37 C)    Resp 16    SpO2 100%  Physical Exam Vitals and nursing note reviewed.  Constitutional:      General: She is not in acute distress.    Appearance: She is well-developed.     Comments: Elderly female awake and alert sitting upright smiling, speaking English in succinct replies.  HENT:     Head: Normocephalic and atraumatic.  Eyes:     Conjunctiva/sclera: Conjunctivae normal.  Cardiovascular:     Rate and Rhythm: Normal rate and regular rhythm.  Pulmonary:     Effort: Pulmonary effort is normal. No respiratory distress.     Breath sounds: Normal breath sounds. No  stridor.  Abdominal:     General: There is no distension.  Skin:    General: Skin is warm and dry.  Neurological:     Mental Status: She is alert and oriented to person, place, and time.     Cranial Nerves: No cranial nerve deficit.    ED Results / Procedures / Treatments   Labs (all labs ordered are listed, but only abnormal results are displayed) Labs Reviewed  COMPREHENSIVE METABOLIC PANEL - Abnormal; Notable for the following components:      Result Value   Glucose, Bld 293 (*)    BUN 25 (*)    Creatinine, Ser 1.25 (*)    Calcium 8.8 (*)    Total Protein 8.4 (*)    GFR, Estimated 45 (*)    All other components within normal limits  CBC WITH DIFFERENTIAL/PLATELET - Abnormal; Notable for the following components:   WBC 15.8 (*)    Neutro Abs 12.1 (*)    Abs Immature Granulocytes 0.09 (*)    All other components within normal limits  URINALYSIS, ROUTINE W REFLEX MICROSCOPIC - Abnormal; Notable for the following components:   APPearance CLOUDY (*)    Specific Gravity, Urine >1.030 (*)    Glucose, UA 250 (*)    Hgb urine dipstick SMALL (*)    Protein, ur 100 (*)    Leukocytes,Ua MODERATE (*)    All other components within normal limits  LIPASE, BLOOD - Abnormal; Notable for the following components:   Lipase 82 (*)    All other components within normal limits  URINALYSIS, MICROSCOPIC (REFLEX) - Abnormal; Notable for the following components:   Bacteria, UA MANY (*)    All other components within normal limits  TROPONIN I (HIGH SENSITIVITY) - Abnormal; Notable for the following components:   Troponin I (High Sensitivity) 18 (*)    All other components within normal limits  TROPONIN I (HIGH SENSITIVITY) - Abnormal; Notable for the following components:   Troponin I (High Sensitivity) 18 (*)    All other components within normal limits  CULTURE, BLOOD (ROUTINE X 2)  RESP PANEL BY RT-PCR (FLU A&B, COVID) [id number]  CULTURE, BLOOD (ROUTINE X 2)  URINE CULTURE  LACTIC ACID,  PLASMA  PROTIME-INR  APTT    EKG EKG Interpretation  Date/Time:  Monday January 14 2021 14:47:02 EST Ventricular Rate:  103 PR Interval:  144 QRS Duration: 88 QT Interval:  320 QTC Calculation: 419 R Axis:   -10 Text Interpretation: Sinus tachycardia Anterior infarct , age undetermined Abnormal ECG When compared with ECG of 30-Jan-2020 18:22, PREVIOUS ECG IS PRESENT Confirmed by  Carmin Muskrat ([id number]) on 01/15/2021 8:20:34 AM  Radiology DG Chest 2 View  Result Date: 01/14/2021 CLINICAL DATA:  generalized weakness, nausea, vomiting, diarrhea, and syncope. EXAM: CHEST - 2 VIEW COMPARISON:  Chest x-ray 01/30/2020, CT chest 01/30/2020 FINDINGS: The heart and mediastinal contours are unchanged. Aortic calcification. Persistent vague airspace opacity at the left lung base consistent with pericardial fat. No focal consolidation. No pulmonary edema. No pleural effusion. No pneumothorax. No acute osseous abnormality. IMPRESSION: No acute cardiopulmonary abnormality. Electronically Signed   By: Iven Finn M.D.   On: 01/14/2021 15:32   CT HEAD WO CONTRAST (5MM)  Result Date: 01/14/2021 CLINICAL DATA:  Provided history: Head trauma, minor. Additional history provided: Generalized weakness for 2 days, syncopal episode (falling to floor from chair). EXAM: CT HEAD WITHOUT CONTRAST TECHNIQUE: Contiguous axial images were obtained from the base of the skull through the vertex without intravenous contrast. COMPARISON:  Brain MRI 04/21/2019. CT angiogram head/neck 04/20/2019. Noncontrast head CT 04/20/2019. FINDINGS: Brain: Mild generalized cerebral and cerebellar atrophy. Redemonstrated chronic lacunar infarct within the right corona radiata/basal ganglia (for instance as seen on series 5, image 14). Known background mild chronic small vessel ischemic disease, better appreciated on the brain MRI of 04/21/2019. A small chronic cortical infarct within the left occipital lobe was also better appreciated on the  prior MRI (left PCA vascular territory). Cavum septum pellucidum and cavum vergae. There is no acute intracranial hemorrhage. No acute demarcated cortical infarct. No extra-axial fluid collection. No evidence of an intracranial mass. No midline shift. Vascular: No hyperdense vessel.  Atherosclerotic calcifications. Skull: Normal. Negative for fracture or focal lesion. Sinuses/Orbits: Visualized orbits show no acute finding. Mild mucosal thickening within the bilateral ethmoid sinuses. IMPRESSION: No evidence of acute intracranial abnormality. Known small chronic cortically-based infarct within the left occipital lobe, chronic lacunar infarct within the right basal ganglia/internal capsule and background mild cerebral white matter chronic small vessel ischemic disease. Mild generalized parenchymal atrophy. Mild mucosal thickening within the bilateral ethmoid sinuses. Electronically Signed   By: Kellie Simmering D.O.   On: 01/14/2021 16:55    Procedures Procedures    Medications Ordered in ED Medications  sodium chloride 0.9 % bolus 1,000 mL (0 mLs Intravenous Stopped 01/15/21 1049)  cefTRIAXone (ROCEPHIN) 1 g in sodium chloride 0.9 % 100 mL IVPB (0 g Intravenous Stopped 01/15/21 1049)    ED Course/ Medical Decision Making/ A&P  10:59 AM I reviewed exam patient is awake, alert, smiling, has no current complaints.  I reviewed findings, discussed them with her and her son.  Findings consistent with urinary tract infection, no evidence for new intracranial abnormality. Cardiac monitor sinus rhythm, rate 90s unremarkable Pulse ox 100% room air normal                          Medical Decision Making  Adult female presents with dysuria, lower abdominal pain, episode of brief change in consciousness, possible syncope.  Here she is awake, alert, moving all extremities spontaneously, I interpreted the CT scan, no intracranial hemorrhage, mass, low suspicion for stroke given the reassuring physical exam,  description of symptoms.  I reviewed her EKG, nonischemic, and though she does have slight elevation in high-sensitivity troponin, this is unchanged on repeat values, low suspicion for ACS, no evidence for sustained arrhythmia on cardiac monitoring which I interpreted as well. No evidence of bacteremia, sepsis, patient does have some evidence for urinary tract infection given her abnormal urinalysis and description of  suprapubic pain.  She is a nonperitoneal abdomen, no negation for advanced imaging of her abdomen.  She has a primary care physician with whom she can follow-up.  After resuscitation with fluids, antibiotics here, without ongoing complaints, patient discharged with close outpatient follow-up.        Final Clinical Impression(s) / ED Diagnoses Final diagnoses:  Lower urinary tract infectious disease  Near syncope     Carmin Muskrat, MD 01/15/21 1101

## 2021-01-15 NOTE — Discharge Instructions (Signed)
As discussed, your evaluation today has been largely reassuring.  But, it is important that you monitor your condition carefully, and do not hesitate to return to the ED if you develop new, or concerning changes in your condition. ? ?Otherwise, please follow-up with your physician for appropriate ongoing care. ? ?

## 2021-01-17 LAB — URINE CULTURE: Culture: >=100000 — AB

## 2021-01-18 ENCOUNTER — Telehealth: Payer: Self-pay | Admitting: *Deleted

## 2021-01-18 NOTE — Telephone Encounter (Signed)
Post ED Visit - Positive Culture Follow-up  Culture report reviewed by antimicrobial stewardship pharmacist: [facility] Team []  Elenor Quinones, Pharm.D. [x]  Heide Guile, Pharm.D., BCPS AQ-ID []  Parks Neptune, Pharm.D., BCPS []  Alycia Rossetti, Pharm.D., BCPS []  [place], Pharm.D., BCPS, AAHIVP []  Legrand Como, Pharm.D., BCPS, AAHIVP []  Salome Arnt, PharmD, BCPS []  Johnnette Gourd, PharmD, BCPS []  Hughes Better, PharmD, BCPS []  Leeroy Cha, PharmD []  Laqueta Linden, PharmD, BCPS []  Albertina Parr, PharmD  [facility] Team []  Leodis Sias, PharmD []  Lindell Spar, PharmD []  Royetta Asal, PharmD []  Graylin Shiver, Rph []  Rema Fendt) Glennon Mac, PharmD []  Arlyn Dunning, PharmD []  Netta Cedars, PharmD []  Dia Sitter, PharmD []  Leone Haven, PharmD []  Gretta Arab, PharmD []  Theodis Shove, PharmD []  Peggyann Juba, PharmD []  Reuel Boom, PharmD   Positive urine culture Treated with Cephalexin, organism sensitive to the same and no further patient follow-up is required at this time.  Harlon Flor Cha [facility] 01/18/2021, 10:01 AM

## 2021-01-19 LAB — CULTURE, BLOOD (ROUTINE X 2)
Culture: NO GROWTH
Special Requests: ADEQUATE

## 2021-02-25 ENCOUNTER — Emergency Department (HOSPITAL_COMMUNITY): Payer: Medicare Other

## 2021-02-25 ENCOUNTER — Emergency Department (HOSPITAL_COMMUNITY)
Admission: EM | Admit: 2021-02-25 | Discharge: 2021-02-25 | Disposition: A | Discharge status: Home / Self Care | Payer: Medicare Other | Attending: Emergency Medicine | Admitting: Emergency Medicine

## 2021-02-25 DIAGNOSIS — Z79899 Other long term (current) drug therapy: Secondary | ICD-10-CM | POA: Diagnosis not present

## 2021-02-25 DIAGNOSIS — Z794 Long term (current) use of insulin: Secondary | ICD-10-CM | POA: Insufficient documentation

## 2021-02-25 DIAGNOSIS — R0600 Dyspnea, unspecified: Secondary | ICD-10-CM | POA: Insufficient documentation

## 2021-02-25 DIAGNOSIS — M25551 Pain in right hip: Secondary | ICD-10-CM | POA: Insufficient documentation

## 2021-02-25 DIAGNOSIS — Z20822 Contact with and (suspected) exposure to covid-19: Secondary | ICD-10-CM | POA: Diagnosis not present

## 2021-02-25 DIAGNOSIS — M25559 Pain in unspecified hip: Secondary | ICD-10-CM

## 2021-02-25 DIAGNOSIS — R059 Cough, unspecified: Secondary | ICD-10-CM | POA: Insufficient documentation

## 2021-02-25 DIAGNOSIS — R630 Anorexia: Secondary | ICD-10-CM | POA: Insufficient documentation

## 2021-02-25 DIAGNOSIS — R0789 Other chest pain: Secondary | ICD-10-CM | POA: Diagnosis not present

## 2021-02-25 DIAGNOSIS — M25552 Pain in left hip: Secondary | ICD-10-CM | POA: Diagnosis not present

## 2021-02-25 DIAGNOSIS — Z7984 Long term (current) use of oral hypoglycemic drugs: Secondary | ICD-10-CM | POA: Diagnosis not present

## 2021-02-25 DIAGNOSIS — I10 Essential (primary) hypertension: Secondary | ICD-10-CM | POA: Insufficient documentation

## 2021-02-25 DIAGNOSIS — R52 Pain, unspecified: Secondary | ICD-10-CM | POA: Diagnosis not present

## 2021-02-25 DIAGNOSIS — E119 Type 2 diabetes mellitus without complications: Secondary | ICD-10-CM | POA: Insufficient documentation

## 2021-02-25 DIAGNOSIS — Z7982 Long term (current) use of aspirin: Secondary | ICD-10-CM | POA: Diagnosis not present

## 2021-02-25 LAB — CBC WITH DIFFERENTIAL/PLATELET
Abs Immature Granulocytes: 0.04 10*3/uL (ref 0.00–0.07)
Basophils Absolute: 0.1 10*3/uL (ref 0.0–0.1)
Basophils Relative: 1 %
Eosinophils Absolute: 0.7 10*3/uL — ABNORMAL HIGH (ref 0.0–0.5)
Eosinophils Relative: 8 %
HCT: 36.8 % (ref 36.0–46.0)
Hemoglobin: 13 g/dL (ref 12.0–15.0)
Immature Granulocytes: 0 %
Lymphocytes Relative: 26 %
Lymphs Abs: 2.3 10*3/uL (ref 0.7–4.0)
MCH: 30.7 pg (ref 26.0–34.0)
MCHC: 35.3 g/dL (ref 30.0–36.0)
MCV: 87 fL (ref 80.0–100.0)
Monocytes Absolute: 0.4 10*3/uL (ref 0.1–1.0)
Monocytes Relative: 5 %
Neutro Abs: 5.3 10*3/uL (ref 1.7–7.7)
Neutrophils Relative %: 60 %
Platelets: 229 10*3/uL (ref 150–400)
RBC: 4.23 MIL/uL (ref 3.87–5.11)
RDW: 11.7 % (ref 11.5–15.5)
WBC: 8.9 10*3/uL (ref 4.0–10.5)
nRBC: 0 % (ref 0.0–0.2)

## 2021-02-25 LAB — COMPREHENSIVE METABOLIC PANEL
ALT: 13 U/L (ref 0–44)
AST: 20 U/L (ref 15–41)
Albumin: 4 g/dL (ref 3.5–5.0)
Alkaline Phosphatase: 76 U/L (ref 38–126)
Anion gap: 12 (ref 5–15)
BUN: 26 mg/dL — ABNORMAL HIGH (ref 8–23)
CO2: 25 mmol/L (ref 22–32)
Calcium: 8.6 mg/dL — ABNORMAL LOW (ref 8.9–10.3)
Chloride: 103 mmol/L (ref 98–111)
Creatinine, Ser: 1.5 mg/dL — ABNORMAL HIGH (ref 0.44–1.00)
GFR, Estimated: 36 mL/min — ABNORMAL LOW (ref >60)
Glucose, Bld: 125 mg/dL — ABNORMAL HIGH (ref 70–99)
Potassium: 4.4 mmol/L (ref 3.5–5.1)
Sodium: 140 mmol/L (ref 135–145)
Total Bilirubin: 0.7 mg/dL (ref 0.3–1.2)
Total Protein: 8 g/dL (ref 6.5–8.1)

## 2021-02-25 LAB — URINALYSIS, ROUTINE W REFLEX MICROSCOPIC
Bacteria, UA: NONE SEEN
Bilirubin Urine: NEGATIVE
Glucose, UA: NEGATIVE mg/dL
Ketones, ur: NEGATIVE mg/dL
Leukocytes,Ua: NEGATIVE
Nitrite: NEGATIVE
Protein, ur: 100 mg/dL — AB
Specific Gravity, Urine: 1.009 (ref 1.005–1.030)
pH: 7 (ref 5.0–8.0)

## 2021-02-25 LAB — CBG MONITORING, ED: Glucose-Capillary: 128 mg/dL — ABNORMAL HIGH (ref 70–99)

## 2021-02-25 LAB — RESP PANEL BY RT-PCR (FLU A&B, COVID) ARPGX2
Influenza A by PCR: NEGATIVE
Influenza B by PCR: NEGATIVE
SARS Coronavirus 2 by RT PCR: NEGATIVE

## 2021-02-25 LAB — TROPONIN I (HIGH SENSITIVITY): Troponin I (High Sensitivity): 14 ng/L (ref <18)

## 2021-02-25 MED ORDER — OXYCODONE-ACETAMINOPHEN 5-325 MG PO TABS
1.0000 | ORAL_TABLET | Freq: Once | ORAL | Status: AC
  Administered 2021-02-25: 1 via ORAL
  Filled 2021-02-25: qty 1

## 2021-02-25 MED ORDER — ACETAMINOPHEN 325 MG PO TABS
325.0000 mg | ORAL_TABLET | Freq: Once | ORAL | Status: AC
  Administered 2021-02-25: 325 mg via ORAL
  Filled 2021-02-25: qty 1

## 2021-02-25 NOTE — ED Provider Notes (Signed)
[facility] EMERGENCY DEPARTMENT Provider Note   CSN: [id number] Arrival date & time: 02/25/21  5615     History  Chief Complaint  Patient presents with   Generalized pain    Nichole Munoz is a 76 y.o. female.  HPI History obtained through [employer] Patient originally from [place] This is a 76 year old female who presents today complaining of bilateral hip pain, chest tightness, and dyspnea.  She reports that she has had all these problems off and on for an extended period of time.  However the hip pain has worsened.  She did have 1 fall but is unclear whether or not this is temporally related to her pain.  She reports that she has intermittently had chest tightness especially over the past week.  She has had some associated cough which is nonproductive.  She denies fever, chills, head injury, headache.  She endorses some long-term visual blurring but nothing new.  She denies any lateralized weakness, loss of bowel or bladder control or perineal numbness.  She reports that she has had poor appetite but denies nausea vomiting or diarrhea.     Home Medications Prior to Admission medications   Medication Sig Start Date End Date Taking? Authorizing Provider  albuterol (PROVENTIL HFA;VENTOLIN HFA) 108 (90 Base) MCG/ACT inhaler Inhale 2 puffs into the lungs every 6 (six) hours as needed for wheezing or shortness of breath. 02/21/18   Nita Sells, MD  amLODipine (NORVASC) 5 MG tablet Take 1 tablet (5 mg total) by mouth daily. 04/28/19   Little Ishikawa, MD  aspirin EC 81 MG EC tablet Take 1 tablet (81 mg total) by mouth daily. 04/28/19   Little Ishikawa, MD  atorvastatin (LIPITOR) 40 MG tablet Take 1 tablet (40 mg total) by mouth daily. 04/28/19   Little Ishikawa, MD  blood glucose meter kit and supplies Dispense based on patient and insurance preference. Use up to four times daily as directed. (FOR ICD-10 E10.9, E11.9). 01/26/17   Dessa Phi, DO   [employer] IRON 325 (65 Fe) MG tablet Take 325 mg by mouth daily. 08/12/19   [provider]  diclofenac Sodium (VOLTAREN) 1 % GEL Apply 2 g topically 4 (four) times daily. Patient taking differently: Apply 2 g topically 4 (four) times daily as needed (pain).  02/19/19   Khatri, Hina, PA-C  gabapentin (NEURONTIN) 300 MG capsule Take 300 mg by mouth 2 (two) times daily. 08/12/19   [provider]  glipiZIDE (GLUCOTROL) 5 MG tablet Take 5 mg by mouth 2 (two) times daily. 07/31/19   [provider]  insulin glargine (LANTUS) 100 UNIT/ML injection Inject 0.2 mLs (20 Units total) into the skin 2 (two) times daily. 04/28/19 04/13/21  Little Ishikawa, MD  Insulin Pen Needle 31G X 5 MM MISC Use up to 4 times daily with insulin 01/26/17   Dessa Phi, DO  lisinopril (ZESTRIL) 10 MG tablet Take 10 mg by mouth daily. 07/31/19   [provider]  metFORMIN (GLUCOPHAGE) 500 MG tablet Take 500 mg by mouth 2 (two) times daily. 01/12/21   [provider]  pantoprazole (PROTONIX) 40 MG tablet Take 1 tablet (40 mg total) by mouth at bedtime. 04/28/19   Little Ishikawa, MD  rosuvastatin (CRESTOR) 20 MG tablet Take 20 mg by mouth daily. 02/06/21   [provider]      Allergies    Other and No known allergies    Review of Systems   Review of Systems  Constitutional:  Positive for activity change.  HENT:  Negative for congestion, sinus pressure, sinus pain, sneezing and sore throat.   Eyes:        Blurring of vision which she attributes to agent is not new  Respiratory:  Positive for cough, chest tightness and shortness of breath.   Cardiovascular: Negative.   Gastrointestinal:        Poor appetite  Endocrine: Negative.   Musculoskeletal:        Bilateral hip pain that is sharp and stabbing and radiates down her legs with any walking  Skin: Negative.   Hematological: Negative.   Psychiatric/Behavioral:  Positive for sleep disturbance.    Physical Exam Updated  Vital Signs BP 125/90 (BP Location: Right Arm)    Pulse 75    Temp 98.5 F (36.9 C) (Oral)    Resp 20    Ht 1.524 m (5')    Wt 59 kg    SpO2 100%    BMI 25.39 kg/m  Physical Exam Vitals reviewed.  Constitutional:      Appearance: Normal appearance.  HENT:     Head: Normocephalic.     Right Ear: External ear normal.     Left Ear: External ear normal.     Nose: Nose normal.     Mouth/Throat:     Pharynx: Oropharynx is clear.  Eyes:     Extraocular Movements: Extraocular movements intact.     Pupils: Pupils are equal, round, and reactive to light.  Cardiovascular:     Rate and Rhythm: Normal rate.     Pulses: Normal pulses.  Pulmonary:     Effort: Pulmonary effort is normal.     Breath sounds: Normal breath sounds.     Comments: Chest wall significant for markings consistent with coining Abdominal:     General: Abdomen is flat.     Palpations: Abdomen is soft.  Musculoskeletal:        General: No swelling or tenderness. Normal range of motion.     Cervical back: Normal range of motion.     Right lower leg: No edema.     Left lower leg: No edema.     Comments: Some old bruising noted about left knee  Skin:    Capillary Refill: Capillary refill takes less than 2 seconds.  Neurological:     General: No focal deficit present.     Mental Status: She is alert.  Psychiatric:        Mood and Affect: Mood normal.    ED Results / Procedures / Treatments   Labs (all labs ordered are listed, but only abnormal results are displayed) Labs Reviewed  CBC WITH DIFFERENTIAL/PLATELET - Abnormal; Notable for the following components:      Result Value   Eosinophils Absolute 0.7 (*)    All other components within normal limits  COMPREHENSIVE METABOLIC PANEL - Abnormal; Notable for the following components:   Glucose, Bld 125 (*)    BUN 26 (*)    Creatinine, Ser 1.50 (*)    Calcium 8.6 (*)    GFR, Estimated 36 (*)    All other components within normal limits  URINALYSIS, ROUTINE W  REFLEX MICROSCOPIC - Abnormal; Notable for the following components:   Color, Urine STRAW (*)    Hgb urine dipstick SMALL (*)    Protein, ur 100 (*)    All other components within normal limits  CBG MONITORING, ED - Abnormal; Notable for the following components:   Glucose-Capillary 128 (*)  All other components within normal limits  RESP PANEL BY RT-PCR (FLU A&B, COVID) [id number]  TROPONIN I (HIGH SENSITIVITY)  TROPONIN I (HIGH SENSITIVITY)    EKG EKG Interpretation  Date/Time:  Monday February 25 2021 08:49:48 EST Ventricular Rate:  91 PR Interval:  144 QRS Duration: 86 QT Interval:  378 QTC Calculation: 466 R Axis:   41 Text Interpretation: Sinus rhythm Borderline T abnormalities, inferior leads Confirmed by Pattricia Boss [phone]) on 02/25/2021 9:01:39 AM  Radiology DG Pelvis Portable  Result Date: 02/25/2021 CLINICAL DATA:  76 year old female with cough, chest tightness, pain. EXAM: PORTABLE PELVIS 1-2 VIEWS COMPARISON:  CT Abdomen and Pelvis 09/04/2019. FINDINGS: Portable AP supine view at 1006 hours. Femoral heads are normally located. Hip joint spaces appear stable and within normal limits for age. Pelvis appears intact. SI joints appear symmetric. No acute osseous abnormality identified. Numerous pelvic phleboliths. Otherwise negative visible lower abdominal and pelvic visceral contours. IMPRESSION: Negative. Electronically Signed   By: Genevie Ann M.D.   On: 02/25/2021 10:25   DG Chest Port 1 View  Result Date: 02/25/2021 CLINICAL DATA:  76 year old female with cough, chest tightness, pain. EXAM: PORTABLE CHEST 1 VIEW COMPARISON:  Chest radiographs 01/14/2021 and earlier. FINDINGS: Portable AP semi upright view at 1007 hours. Stable borderline to mild cardiomegaly. Mildly lower lung volumes. Mild elevation of the right hemidiaphragm appears to be normal variant. Other mediastinal contours are within normal limits. Visualized tracheal air column is within normal limits. Allowing for  portable technique the lungs are clear. No pneumothorax or pleural effusion. No acute osseous abnormality identified. Negative visible bowel gas. IMPRESSION: No acute cardiopulmonary abnormality. Electronically Signed   By: Genevie Ann M.D.   On: 02/25/2021 10:24    Procedures Procedures    Medications Ordered in ED Medications  oxyCODONE-acetaminophen (PERCOCET/ROXICET) 5-325 MG per tablet 1 tablet (1 tablet Oral Given 02/25/21 1004)  acetaminophen (TYLENOL) tablet 325 mg (325 mg Oral Given 02/25/21 1004)    ED Course/ Medical Decision Making/ A&P Clinical Course as of 02/25/21 1852  Mon Feb 25, 2021  1025 CBC with Differential(!) CBC reviewed interpreted normal [DR]  1025 Comprehensive metabolic panel(!) [DR]  6606 C-Met reviewed interpreted and normal except for glucose mildly elevated at 125, creatinine increased at 1.5 This is compared to previous creatinines with first prior being 1.25 and first prior reported at 1.7, therefore appears to be average renal function in this patient COVID swab is returned to normal [DR]  1026 Urinalysis, Routine w reflex microscopic Urine, Clean Catch(!) Urinalysis reviewed and interpreted with small protein and squamous epithelial cells noted.  It does not appear significant for infection [DR]  1028 Pelvis x-Javarious Elsayed reviewed by me and personally interpreted with no acute bony abnormality Reviewed radiologist interpretation and concurs  [DR]  1029 Chest x-Laden Fieldhouse personally reviewed and interpreted with no definite acute abnormalities noted Radiology interpretation reviewed [DR]  1126 First troponin mildly elevated at 14 [DR]  1127 Reviewed last 3 troponins and they were 1818 and 14 [DR]    Clinical Course User Index [DR] Pattricia Boss, MD                           Medical Decision Making 76 year old female, history obtained through [employer], presents today complaining of chest tightness, cough, dyspnea and bilateral hip pain.  Review of records  reveal that she complained of hip pain in August when she was admitted to the hospitalist service.  Her presenting  complaint at that time the ED was hip pain but she was eventually found to have urinary retention, UTI, uncontrolled hypertension and diabetes.  Today, she additionally has some chest tightness that has been coming and going over an extended period of time as well as the hip pain.  EKG obtained does not show evidence of acute ischemia.  Lung exam and x-Aalyiah Camberos show no evidence of acute wheezing, crackles, or rails.  Similarly, chest x-Adal Sereno does not show evidence of acute fluid or pneumothorax. 1 chest tightness patient has troponins pending. 2 hip pain pelvis x-Jaxson Anglin obtained no acute neurological deficit.  Treated here with Percocet  Patient has had ongoing chest tightness.  Her initial troponin is 14.  Prior were reviewed.  I do not feel that she needs to have serial troponins given the ongoing nature of her pain. Discussed results with patient through interpreter Patient advised that she needs to have close follow-up with her primary care She currently is not complaining of the chest tightness and component.  We did discuss the need for follow-up with this She is complaining of the hip pain.  She states she has been evaluated for this before.  She is advised regarding return precautions for acute red flags and for need for follow-up and voices understanding.  Amount and/or Complexity of Data Reviewed Labs: ordered. Decision-making details documented in ED Course. Radiology: ordered and independent interpretation performed. Decision-making details documented in ED Course.  Risk OTC drugs. Prescription drug management. Risk Details: Plan Short course of steroids Discussed return precautions            Final Clinical Impression(s) / ED Diagnoses Final diagnoses:  Chest tightness  Hip pain    Rx / DC Orders ED Discharge Orders     None         Pattricia Boss,  MD 02/25/21 [phone]

## 2021-02-25 NOTE — Discharge Instructions (Signed)
Please follow up with your primary care doctor in the next 2-3 days Return if you are having any new loss of bowel or bladder control or inability to move your lower extremities Return if you are having worsening chest tightness especially with associated shortness of breath, fever, chills or pain.

## 2021-02-25 NOTE — ED Triage Notes (Signed)
Pt here from home d/t generalized pain from head to toes. Language barrier. Pt used "coining" to help decrease pain. Diabetic history. Poor historian. Unknown if compliant with medications. 10/10 pain

## 2021-06-03 ENCOUNTER — Emergency Department (HOSPITAL_COMMUNITY): Payer: Medicare Other

## 2021-06-03 ENCOUNTER — Observation Stay (HOSPITAL_COMMUNITY)
Admission: EM | Admit: 2021-06-03 | Discharge: 2021-06-04 | Disposition: A | Discharge status: Home Health | Payer: Medicare Other | Attending: Internal Medicine | Admitting: Internal Medicine

## 2021-06-03 ENCOUNTER — Encounter (HOSPITAL_COMMUNITY): Payer: Self-pay | Admitting: Emergency Medicine

## 2021-06-03 ENCOUNTER — Other Ambulatory Visit: Payer: Self-pay

## 2021-06-03 DIAGNOSIS — G9341 Metabolic encephalopathy: Secondary | ICD-10-CM

## 2021-06-03 DIAGNOSIS — Z7984 Long term (current) use of oral hypoglycemic drugs: Secondary | ICD-10-CM | POA: Insufficient documentation

## 2021-06-03 DIAGNOSIS — R1084 Generalized abdominal pain: Secondary | ICD-10-CM | POA: Diagnosis not present

## 2021-06-03 DIAGNOSIS — Z79899 Other long term (current) drug therapy: Secondary | ICD-10-CM | POA: Diagnosis not present

## 2021-06-03 DIAGNOSIS — E1100 Type 2 diabetes mellitus with hyperosmolarity without nonketotic hyperglycemic-hyperosmolar coma (NKHHC): Principal | ICD-10-CM | POA: Insufficient documentation

## 2021-06-03 DIAGNOSIS — I10 Essential (primary) hypertension: Secondary | ICD-10-CM | POA: Diagnosis not present

## 2021-06-03 DIAGNOSIS — E1140 Type 2 diabetes mellitus with diabetic neuropathy, unspecified: Secondary | ICD-10-CM | POA: Insufficient documentation

## 2021-06-03 DIAGNOSIS — J45909 Unspecified asthma, uncomplicated: Secondary | ICD-10-CM | POA: Insufficient documentation

## 2021-06-03 DIAGNOSIS — N1831 Chronic kidney disease, stage 3a: Secondary | ICD-10-CM | POA: Diagnosis not present

## 2021-06-03 DIAGNOSIS — N179 Acute kidney failure, unspecified: Secondary | ICD-10-CM | POA: Insufficient documentation

## 2021-06-03 DIAGNOSIS — E1169 Type 2 diabetes mellitus with other specified complication: Secondary | ICD-10-CM

## 2021-06-03 DIAGNOSIS — Z794 Long term (current) use of insulin: Secondary | ICD-10-CM | POA: Insufficient documentation

## 2021-06-03 DIAGNOSIS — E119 Type 2 diabetes mellitus without complications: Secondary | ICD-10-CM

## 2021-06-03 DIAGNOSIS — I129 Hypertensive chronic kidney disease with stage 1 through stage 4 chronic kidney disease, or unspecified chronic kidney disease: Secondary | ICD-10-CM | POA: Diagnosis not present

## 2021-06-03 DIAGNOSIS — R531 Weakness: Secondary | ICD-10-CM | POA: Diagnosis present

## 2021-06-03 DIAGNOSIS — E871 Hypo-osmolality and hyponatremia: Secondary | ICD-10-CM | POA: Diagnosis not present

## 2021-06-03 DIAGNOSIS — E1122 Type 2 diabetes mellitus with diabetic chronic kidney disease: Secondary | ICD-10-CM | POA: Insufficient documentation

## 2021-06-03 DIAGNOSIS — E785 Hyperlipidemia, unspecified: Secondary | ICD-10-CM

## 2021-06-03 HISTORY — DX: Other displaced fracture of seventh cervical vertebra, initial encounter for closed fracture: S12.690A

## 2021-06-03 LAB — GLUCOSE, CAPILLARY
Glucose-Capillary: 229 mg/dL — ABNORMAL HIGH (ref 70–99)
Glucose-Capillary: 360 mg/dL — ABNORMAL HIGH (ref 70–99)
Glucose-Capillary: 413 mg/dL — ABNORMAL HIGH (ref 70–99)

## 2021-06-03 LAB — CBG MONITORING, ED
Glucose-Capillary: 577 mg/dL (ref 70–99)
Glucose-Capillary: 504 mg/dL (ref 70–99)

## 2021-06-03 LAB — CBC WITH DIFFERENTIAL/PLATELET
Abs Immature Granulocytes: 0.04 10*3/uL (ref 0.00–0.07)
Basophils Absolute: 0 10*3/uL (ref 0.0–0.1)
Basophils Relative: 1 %
Eosinophils Absolute: 0.1 10*3/uL (ref 0.0–0.5)
Eosinophils Relative: 1 %
HCT: 35 % — ABNORMAL LOW (ref 36.0–46.0)
Hemoglobin: 12.4 g/dL (ref 12.0–15.0)
Immature Granulocytes: 1 %
Lymphocytes Relative: 13 %
Lymphs Abs: 1.1 10*3/uL (ref 0.7–4.0)
MCH: 29.8 pg (ref 26.0–34.0)
MCHC: 35.4 g/dL (ref 30.0–36.0)
MCV: 84.1 fL (ref 80.0–100.0)
Monocytes Absolute: 0.3 10*3/uL (ref 0.1–1.0)
Monocytes Relative: 4 %
Neutro Abs: 6.7 10*3/uL (ref 1.7–7.7)
Neutrophils Relative %: 80 %
Platelets: 240 10*3/uL (ref 150–400)
RBC: 4.16 MIL/uL (ref 3.87–5.11)
RDW: 10.8 % — ABNORMAL LOW (ref 11.5–15.5)
WBC: 8.3 10*3/uL (ref 4.0–10.5)
nRBC: 0 % (ref 0.0–0.2)

## 2021-06-03 LAB — I-STAT VENOUS BLOOD GAS, ED
Acid-Base Excess: 5 mmol/L — ABNORMAL HIGH (ref 0.0–2.0)
Bicarbonate: 32.1 mmol/L — ABNORMAL HIGH (ref 20.0–28.0)
Calcium, Ion: 1.19 mmol/L (ref 1.15–1.40)
HCT: 38 % (ref 36.0–46.0)
Hemoglobin: 12.9 g/dL (ref 12.0–15.0)
O2 Saturation: 99 %
Potassium: 5 mmol/L (ref 3.5–5.1)
Sodium: 127 mmol/L — ABNORMAL LOW (ref 135–145)
TCO2: 34 mmol/L — ABNORMAL HIGH (ref 22–32)
pCO2, Ven: 57.7 mmHg (ref 44–60)
pH, Ven: 7.353 (ref 7.25–7.43)
pO2, Ven: 175 mmHg — ABNORMAL HIGH (ref 32–45)

## 2021-06-03 LAB — BASIC METABOLIC PANEL
Anion gap: 14 (ref 5–15)
BUN: 43 mg/dL — ABNORMAL HIGH (ref 8–23)
CO2: 20 mmol/L — ABNORMAL LOW (ref 22–32)
Calcium: 9 mg/dL (ref 8.9–10.3)
Chloride: 96 mmol/L — ABNORMAL LOW (ref 98–111)
Creatinine, Ser: 2.59 mg/dL — ABNORMAL HIGH (ref 0.44–1.00)
GFR, Estimated: 19 mL/min — ABNORMAL LOW (ref >60)
Glucose, Bld: 592 mg/dL (ref 70–99)
Potassium: 5.1 mmol/L (ref 3.5–5.1)
Sodium: 130 mmol/L — ABNORMAL LOW (ref 135–145)

## 2021-06-03 LAB — URINALYSIS, ROUTINE W REFLEX MICROSCOPIC
Bilirubin Urine: NEGATIVE
Glucose, UA: >=500 mg/dL — AB
Ketones, ur: 5 mg/dL — AB
Nitrite: NEGATIVE
Protein, ur: 100 mg/dL — AB
Specific Gravity, Urine: 1.017 (ref 1.005–1.030)
pH: 7 (ref 5.0–8.0)

## 2021-06-03 LAB — COMPREHENSIVE METABOLIC PANEL
ALT: 16 U/L (ref 0–44)
AST: 17 U/L (ref 15–41)
Albumin: 3.9 g/dL (ref 3.5–5.0)
Alkaline Phosphatase: 99 U/L (ref 38–126)
Anion gap: 11 (ref 5–15)
BUN: 44 mg/dL — ABNORMAL HIGH (ref 8–23)
CO2: 27 mmol/L (ref 22–32)
Calcium: 9.6 mg/dL (ref 8.9–10.3)
Chloride: 91 mmol/L — ABNORMAL LOW (ref 98–111)
Creatinine, Ser: 3.05 mg/dL — ABNORMAL HIGH (ref 0.44–1.00)
GFR, Estimated: 15 mL/min — ABNORMAL LOW (ref >60)
Glucose, Bld: 732 mg/dL (ref 70–99)
Potassium: 5.1 mmol/L (ref 3.5–5.1)
Sodium: 129 mmol/L — ABNORMAL LOW (ref 135–145)
Total Bilirubin: 1.1 mg/dL (ref 0.3–1.2)
Total Protein: 8.2 g/dL — ABNORMAL HIGH (ref 6.5–8.1)

## 2021-06-03 LAB — BETA-HYDROXYBUTYRIC ACID: Beta-Hydroxybutyric Acid: 1.13 mmol/L — ABNORMAL HIGH (ref 0.05–0.27)

## 2021-06-03 LAB — HEMOGLOBIN A1C
Hgb A1c MFr Bld: 12.2 % — ABNORMAL HIGH (ref 4.8–5.6)
Mean Plasma Glucose: 303.44 mg/dL

## 2021-06-03 LAB — OSMOLALITY: Osmolality: 321 mOsm/kg (ref 275–295)

## 2021-06-03 MED ORDER — DEXTROSE 50 % IV SOLN: 0.0000 mL | INTRAVENOUS | Status: DC | PRN

## 2021-06-03 MED ORDER — SODIUM CHLORIDE 0.9 % IV BOLUS
1000.0000 mL | Freq: Once | INTRAVENOUS | Status: AC
  Administered 2021-06-03: 1000 mL via INTRAVENOUS

## 2021-06-03 MED ORDER — DEXTROSE IN LACTATED RINGERS 5 % IV SOLN: INTRAVENOUS | Status: DC

## 2021-06-03 MED ORDER — HEPARIN SODIUM (PORCINE) 5000 UNIT/ML IJ SOLN
5000.0000 [IU] | Freq: Three times a day (TID) | INTRAMUSCULAR | Status: DC
Start: 2021-06-03 — End: 2021-06-04
  Administered 2021-06-03 – 2021-06-04 (×3): 5000 [IU] via SUBCUTANEOUS
  Filled 2021-06-03: qty 1

## 2021-06-03 MED ORDER — LACTATED RINGERS IV SOLN: INTRAVENOUS | Status: DC

## 2021-06-03 MED ORDER — LABETALOL HCL 5 MG/ML IV SOLN
10.0000 mg | INTRAVENOUS | Status: DC | PRN
Start: 2021-06-03 — End: 2021-06-04

## 2021-06-03 MED ORDER — INSULIN REGULAR(HUMAN) IN NACL 100-0.9 UT/100ML-% IV SOLN
INTRAVENOUS | Status: AC
  Administered 2021-06-03: 7.5 [IU]/h via INTRAVENOUS
  Filled 2021-06-03 (×2): qty 100

## 2021-06-03 MED ORDER — ACETAMINOPHEN 325 MG PO TABS: 650.0000 mg | ORAL_TABLET | Freq: Four times a day (QID) | ORAL | Status: DC | PRN

## 2021-06-03 MED ORDER — LACTATED RINGERS IV BOLUS
20.0000 mL/kg | Freq: Once | INTRAVENOUS | Status: AC
  Administered 2021-06-03: 1000 mL via INTRAVENOUS

## 2021-06-03 MED ORDER — ACETAMINOPHEN 650 MG RE SUPP: 650.0000 mg | Freq: Four times a day (QID) | RECTAL | Status: DC | PRN

## 2021-06-03 MED ORDER — ONDANSETRON HCL 4 MG PO TABS: 4.0000 mg | ORAL_TABLET | Freq: Four times a day (QID) | ORAL | Status: DC | PRN

## 2021-06-03 MED ORDER — ONDANSETRON HCL 4 MG/2ML IJ SOLN: 4.0000 mg | Freq: Four times a day (QID) | INTRAMUSCULAR | Status: DC | PRN

## 2021-06-03 MED ORDER — LACTATED RINGERS IV BOLUS: 20.0000 mL/kg | Freq: Once | INTRAVENOUS | Status: DC

## 2021-06-03 NOTE — Assessment & Plan Note (Addendum)
Admit to progressive observation bed. IV insulin gtts protocol for HHS. Pt is not in DKA. Continue with IV with LR. Give 20 ml/kg bolus. Check A1c.  Hyperosmolar hyperglycemic state (HHS) ([place]) is a Acute illness/condition that poses a threat to life or bodily function. Repeat CMP, Mg in AM.

## 2021-06-03 NOTE — ED Triage Notes (Signed)
Pt arrives via EMS- pt found laying on floor in urine and vomit- witnessed fall, but family was unable to get pt up. Pt has been having abd pain, n/v for several days. Pt does not speak english. Pt speaks [place]ian. Pt has been having frequent urination, dizziness. No obvious injuries from fall. Unknown if pt had LOC.  BP 170/76, CBG reading HIGH, 96% on room air, HR 108

## 2021-06-03 NOTE — Assessment & Plan Note (Addendum)
Pseudohyponatremia due to hyperglycemia.

## 2021-06-03 NOTE — Assessment & Plan Note (Signed)
Acutely worsened due to likely dehydration from HHS.  Baseline creatinine 1.4.

## 2021-06-03 NOTE — ED Notes (Signed)
Patient transported to CT 

## 2021-06-03 NOTE — Assessment & Plan Note (Signed)
Multifactorial etiology.  Present on admission.  Likely due to hyperosmolar hyperglycemic state, acute renal failure.  Possible UTI.  Awaiting cath UA.
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
## 2021-06-03 NOTE — ED Provider Notes (Signed)
4:15 PM-patient being evaluated for following and unable to get up, was found covered in urine and emesis, with decreased responsiveness.  She has been ill for several days.  Patient has history of chronic kidney disease, diabetes which is insulin-dependent and is elderly.  She speaks [place] only.  6:38 PM-preliminary laboratory results remarkable for mild elevation of beta hydroxybutyrate, glucose very high at 732 with elevated BUN and creatinine.  Anion gap is normal.  CBC is essentially normal.  Venous blood gas is reassuring.  Urinalysis still pending.  7:15 PM-attempted to contact listed contact with the patient and there was no answer.  I left a message to return my call.  MDM,- apparent fall with altered mental status, and new AKI.  Significant elevated blood sugar, nonspecific etiology.  Patient was reported to be vomiting.  Head and cervical spine CTs did not show any acute abnormalities.  CT abdomen pelvis, remarkable for bladder wall thickening.  I have reviewed and interpreted the images.   Case discussed with hospitalist who will admit the patient for treatment.      Daleen Bo, MD 06/03/21 2013

## 2021-06-03 NOTE — Assessment & Plan Note (Addendum)
Baseline SCr 1.4. continue with IVF. Unclear if pt taking ACEI at home. No answer on contact list. Hold nephrotoxic agents.

## 2021-06-03 NOTE — Subjective & Objective (Signed)
CC: found on floor HPI: 77 year old [place] female history of type 2 diabetes, prior history of stroke, hypertension history of UTIs, presents to the ER via EMS due to being "found on the floor".  No family is available.  Patient unable to use the video language interpreter due to encephalopathy.  No family is available by phone.  Per triage, patient was found on the floor in urine and vomit.  On arrival temp 90.2 heart rate 101 blood pressure 144/115.  Labs sodium 129, potassium 5.1, bicarb 27, glucose of 732, BUN of 44, creatinine 3.0 baseline creatinine back in 2021 was 1.4  Venous pH 7.35 PCO2 57 PO2 175  White count 8.3 hemoglobin 12.4 platelets 240 CT scan showed small gallstones.  No acute intra-abdominal pathology.  There was some nondependent gas in the bladder.  CT C-spine negative for fracture.  CT head shows old right basal ganglier lacunar infarct.  No acute intracranial abnormality.  [facility] hospitalist contacted for admission.

## 2021-06-03 NOTE — ED Provider Notes (Signed)
[facility] EMERGENCY DEPARTMENT Provider Note   CSN: [id number] Arrival date & time: 06/03/21  1429     History  Chief Complaint  Patient presents with   Fall   Vomiting   Hyperglycemia    Nichole Munoz is a 76 y.o. female.  Patient presents ER chief complaint of generalized weakness, falls, high blood sugars.  Complaining of generalized abdominal pain as well.  No reports of fevers.  Positive vomiting.  No reports of chest pain.      Home Medications Prior to Admission medications   Medication Sig Start Date End Date Taking? Authorizing Provider  albuterol (PROVENTIL HFA;VENTOLIN HFA) 108 (90 Base) MCG/ACT inhaler Inhale 2 puffs into the lungs every 6 (six) hours as needed for wheezing or shortness of breath. 02/21/18   Nita Sells, MD  amLODipine (NORVASC) 5 MG tablet Take 1 tablet (5 mg total) by mouth daily. 04/28/19   Little Ishikawa, MD  aspirin EC 81 MG EC tablet Take 1 tablet (81 mg total) by mouth daily. 04/28/19   Little Ishikawa, MD  atorvastatin (LIPITOR) 40 MG tablet Take 1 tablet (40 mg total) by mouth daily. 04/28/19   Little Ishikawa, MD  blood glucose meter kit and supplies Dispense based on patient and insurance preference. Use up to four times daily as directed. (FOR ICD-10 E10.9, E11.9). 01/26/17   Dessa Phi, DO  [employer] IRON 325 (65 Fe) MG tablet Take 325 mg by mouth daily. 08/12/19   [provider]  diclofenac Sodium (VOLTAREN) 1 % GEL Apply 2 g topically 4 (four) times daily. Patient taking differently: Apply 2 g topically 4 (four) times daily as needed (pain).  02/19/19   Khatri, Hina, PA-C  gabapentin (NEURONTIN) 300 MG capsule Take 300 mg by mouth 2 (two) times daily. 08/12/19   [provider]  glipiZIDE (GLUCOTROL) 5 MG tablet Take 5 mg by mouth 2 (two) times daily. 07/31/19   [provider]  insulin glargine (LANTUS) 100 UNIT/ML injection Inject 0.2 mLs (20 Units total) into the skin 2 (two)  times daily. 04/28/19 04/13/21  Little Ishikawa, MD  Insulin Pen Needle 31G X 5 MM MISC Use up to 4 times daily with insulin 01/26/17   Dessa Phi, DO  lisinopril (ZESTRIL) 10 MG tablet Take 10 mg by mouth daily. 07/31/19   [provider]  metFORMIN (GLUCOPHAGE) 500 MG tablet Take 500 mg by mouth 2 (two) times daily. 01/12/21   [provider]  pantoprazole (PROTONIX) 40 MG tablet Take 1 tablet (40 mg total) by mouth at bedtime. 04/28/19   Little Ishikawa, MD  rosuvastatin (CRESTOR) 20 MG tablet Take 20 mg by mouth daily. 02/06/21   [provider]      Allergies    Other and No known allergies    Review of Systems   Review of Systems  Constitutional:  Negative for fever.  HENT:  Negative for ear pain.   Eyes:  Negative for pain.  Respiratory:  Negative for cough.   Cardiovascular:  Negative for chest pain.  Gastrointestinal:  Positive for abdominal pain.  Genitourinary:  Negative for flank pain.  Musculoskeletal:  Negative for back pain.  Skin:  Negative for rash.  Neurological:  Negative for headaches.   Physical Exam Updated Vital Signs BP (!) 144/115 (BP Location: Right Arm)   Pulse (!) 105   Temp 98.2 F (36.8 C) (Oral)   Resp 12   SpO2 97%  Physical Exam Constitutional:  Appearance: Normal appearance.  HENT:     Head: Normocephalic.     Nose: Nose normal.  Eyes:     Extraocular Movements: Extraocular movements intact.  Cardiovascular:     Rate and Rhythm: Normal rate.  Pulmonary:     Effort: Pulmonary effort is normal.  Abdominal:     Comments: Mild diffuse abdominal tenderness.  No guarding or rebound.  Musculoskeletal:        General: Normal range of motion.     Cervical back: Normal range of motion.  Neurological:     Mental Status: She is alert.     Comments: Awake, alert, answers my questions in broken English, moves all extremities.    ED Results / Procedures / Treatments   Labs (all labs ordered are listed, but  only abnormal results are displayed) Labs Reviewed  CBC WITH DIFFERENTIAL/PLATELET - Abnormal; Notable for the following components:      Result Value   HCT 35.0 (*)    RDW 10.8 (*)    All other components within normal limits  I-STAT VENOUS BLOOD GAS, ED - Abnormal; Notable for the following components:   pO2, Ven 175 (*)    Bicarbonate 32.1 (*)    TCO2 34 (*)    Acid-Base Excess 5.0 (*)    Sodium 127 (*)    All other components within normal limits  COMPREHENSIVE METABOLIC PANEL  BETA-HYDROXYBUTYRIC ACID  URINALYSIS, ROUTINE W REFLEX MICROSCOPIC    EKG EKG Interpretation  Date/Time:  Monday Jun 03 2021 14:47:21 EDT Ventricular Rate:  103 PR Interval:  177 QRS Duration: 99 QT Interval:  353 QTC Calculation: 463 R Axis:   10 Text Interpretation: Sinus tachycardia Borderline low voltage, extremity leads Confirmed by Thamas Jaegers ([id number]) on 06/03/2021 2:55:54 PM  Radiology No results found.  Procedures Procedures    Medications Ordered in ED Medications  sodium chloride 0.9 % bolus 1,000 mL (has no administration in time range)    ED Course/ Medical Decision Making/ A&P                           Medical Decision Making Amount and/or Complexity of Data Reviewed Labs: ordered. Radiology: ordered.   Review of records shows visit February 25, 2021 for chest pain and hip pain and generalized aches.  Cardiac monitoring shows sinus rhythm.  Labs are sent VBG appears normal white count 8 hemoglobin 12 chemistry otherwise pending.  CT imaging of the chest abdomen pelvis and head ordered and pending.          Final Clinical Impression(s) / ED Diagnoses Final diagnoses:  None    Rx / DC Orders ED Discharge Orders     None         Luna Fuse, MD 06/03/21 1600

## 2021-06-03 NOTE — Assessment & Plan Note (Addendum)
Chronic. Prn labetalol 10 mg IV due to NPO status.

## 2021-06-04 DIAGNOSIS — E1100 Type 2 diabetes mellitus with hyperosmolarity without nonketotic hyperglycemic-hyperosmolar coma (NKHHC): Secondary | ICD-10-CM | POA: Diagnosis not present

## 2021-06-04 DIAGNOSIS — G9341 Metabolic encephalopathy: Secondary | ICD-10-CM | POA: Diagnosis not present

## 2021-06-04 DIAGNOSIS — N179 Acute kidney failure, unspecified: Secondary | ICD-10-CM | POA: Diagnosis not present

## 2021-06-04 DIAGNOSIS — Z794 Long term (current) use of insulin: Secondary | ICD-10-CM

## 2021-06-04 DIAGNOSIS — E119 Type 2 diabetes mellitus without complications: Secondary | ICD-10-CM | POA: Diagnosis not present

## 2021-06-04 LAB — BASIC METABOLIC PANEL
Anion gap: 9 (ref 5–15)
Anion gap: 13 (ref 5–15)
BUN: 34 mg/dL — ABNORMAL HIGH (ref 8–23)
BUN: 32 mg/dL — ABNORMAL HIGH (ref 8–23)
CO2: 28 mmol/L (ref 22–32)
CO2: 26 mmol/L (ref 22–32)
Calcium: 9.7 mg/dL (ref 8.9–10.3)
Calcium: 9.8 mg/dL (ref 8.9–10.3)
Chloride: 98 mmol/L (ref 98–111)
Chloride: 98 mmol/L (ref 98–111)
Creatinine, Ser: 2.04 mg/dL — ABNORMAL HIGH (ref 0.44–1.00)
Creatinine, Ser: 1.9 mg/dL — ABNORMAL HIGH (ref 0.44–1.00)
GFR, Estimated: 25 mL/min — ABNORMAL LOW (ref >60)
GFR, Estimated: 27 mL/min — ABNORMAL LOW (ref >60)
Glucose, Bld: 167 mg/dL — ABNORMAL HIGH (ref 70–99)
Glucose, Bld: 165 mg/dL — ABNORMAL HIGH (ref 70–99)
Potassium: 3.5 mmol/L (ref 3.5–5.1)
Potassium: 3.5 mmol/L (ref 3.5–5.1)
Sodium: 135 mmol/L (ref 135–145)
Sodium: 137 mmol/L (ref 135–145)

## 2021-06-04 LAB — COMPREHENSIVE METABOLIC PANEL
ALT: 13 U/L (ref 0–44)
AST: 22 U/L (ref 15–41)
Albumin: 3.5 g/dL (ref 3.5–5.0)
Alkaline Phosphatase: 76 U/L (ref 38–126)
Anion gap: 11 (ref 5–15)
BUN: 37 mg/dL — ABNORMAL HIGH (ref 8–23)
CO2: 25 mmol/L (ref 22–32)
Calcium: 9.3 mg/dL (ref 8.9–10.3)
Chloride: 99 mmol/L (ref 98–111)
Creatinine, Ser: 2.26 mg/dL — ABNORMAL HIGH (ref 0.44–1.00)
GFR, Estimated: 22 mL/min — ABNORMAL LOW (ref >60)
Glucose, Bld: 122 mg/dL — ABNORMAL HIGH (ref 70–99)
Potassium: 3.7 mmol/L (ref 3.5–5.1)
Sodium: 135 mmol/L (ref 135–145)
Total Bilirubin: 0.6 mg/dL (ref 0.3–1.2)
Total Protein: 7.1 g/dL (ref 6.5–8.1)

## 2021-06-04 LAB — CBC WITH DIFFERENTIAL/PLATELET
Abs Immature Granulocytes: 0.03 10*3/uL (ref 0.00–0.07)
Basophils Absolute: 0 10*3/uL (ref 0.0–0.1)
Basophils Relative: 0 %
Eosinophils Absolute: 0 10*3/uL (ref 0.0–0.5)
Eosinophils Relative: 0 %
HCT: 31.3 % — ABNORMAL LOW (ref 36.0–46.0)
Hemoglobin: 11 g/dL — ABNORMAL LOW (ref 12.0–15.0)
Immature Granulocytes: 0 %
Lymphocytes Relative: 32 %
Lymphs Abs: 3.5 10*3/uL (ref 0.7–4.0)
MCH: 29.6 pg (ref 26.0–34.0)
MCHC: 35.1 g/dL (ref 30.0–36.0)
MCV: 84.4 fL (ref 80.0–100.0)
Monocytes Absolute: 0.7 10*3/uL (ref 0.1–1.0)
Monocytes Relative: 6 %
Neutro Abs: 6.5 10*3/uL (ref 1.7–7.7)
Neutrophils Relative %: 62 %
Platelets: 223 10*3/uL (ref 150–400)
RBC: 3.71 MIL/uL — ABNORMAL LOW (ref 3.87–5.11)
RDW: 10.9 % — ABNORMAL LOW (ref 11.5–15.5)
WBC: 10.8 10*3/uL — ABNORMAL HIGH (ref 4.0–10.5)
nRBC: 0 % (ref 0.0–0.2)

## 2021-06-04 LAB — GLUCOSE, CAPILLARY
Glucose-Capillary: 163 mg/dL — ABNORMAL HIGH (ref 70–99)
Glucose-Capillary: 163 mg/dL — ABNORMAL HIGH (ref 70–99)
Glucose-Capillary: 142 mg/dL — ABNORMAL HIGH (ref 70–99)
Glucose-Capillary: 213 mg/dL — ABNORMAL HIGH (ref 70–99)
Glucose-Capillary: 193 mg/dL — ABNORMAL HIGH (ref 70–99)
Glucose-Capillary: 183 mg/dL — ABNORMAL HIGH (ref 70–99)
Glucose-Capillary: 146 mg/dL — ABNORMAL HIGH (ref 70–99)
Glucose-Capillary: 143 mg/dL — ABNORMAL HIGH (ref 70–99)
Glucose-Capillary: 136 mg/dL — ABNORMAL HIGH (ref 70–99)
Glucose-Capillary: 296 mg/dL — ABNORMAL HIGH (ref 70–99)
Glucose-Capillary: 320 mg/dL — ABNORMAL HIGH (ref 70–99)
Glucose-Capillary: 334 mg/dL — ABNORMAL HIGH (ref 70–99)

## 2021-06-04 LAB — MAGNESIUM: Magnesium: 1.2 mg/dL — ABNORMAL LOW (ref 1.7–2.4)

## 2021-06-04 MED ORDER — LORAZEPAM 2 MG/ML IJ SOLN
0.5000 mg | Freq: Once | INTRAMUSCULAR | Status: AC
  Administered 2021-06-04: 0.5 mg via INTRAVENOUS

## 2021-06-04 MED ORDER — INSULIN GLARGINE-YFGN 100 UNIT/ML ~~LOC~~ SOLN
15.0000 [IU] | Freq: Every day | SUBCUTANEOUS | Status: DC
  Administered 2021-06-04: 15 [IU] via SUBCUTANEOUS
  Filled 2021-06-04: qty 0.15

## 2021-06-04 MED ORDER — INSULIN ASPART 100 UNIT/ML IJ SOLN
0.0000 [IU] | Freq: Three times a day (TID) | INTRAMUSCULAR | Status: DC
  Administered 2021-06-04: 5 [IU] via SUBCUTANEOUS
  Administered 2021-06-04: 7 [IU] via SUBCUTANEOUS

## 2021-06-04 MED ORDER — SODIUM CHLORIDE 0.9 % IV SOLN
1.0000 g | INTRAVENOUS | Status: DC
  Administered 2021-06-04: 1 g via INTRAVENOUS
  Filled 2021-06-04: qty 10

## 2021-06-04 MED ORDER — INSULIN ASPART 100 UNIT/ML FLEXPEN: 1.0000 [IU] | PEN_INJECTOR | Freq: Three times a day (TID) | SUBCUTANEOUS | 11 refills | Status: DC

## 2021-06-04 MED ORDER — LORAZEPAM 2 MG/ML IJ SOLN
INTRAMUSCULAR | Status: AC
  Filled 2021-06-04: qty 1

## 2021-06-04 MED ORDER — INSULIN GLARGINE-YFGN 100 UNIT/ML ~~LOC~~ SOLN
5.0000 [IU] | Freq: Once | SUBCUTANEOUS | Status: DC
Start: 2021-06-04 — End: 2021-06-04
  Filled 2021-06-04: qty 0.05

## 2021-06-04 MED ORDER — CEFDINIR 300 MG PO CAPS: 300.0000 mg | ORAL_CAPSULE | Freq: Every day | ORAL | 0 refills | Status: AC

## 2021-06-04 MED ORDER — MAGNESIUM SULFATE 2 GM/50ML IV SOLN
2.0000 g | INTRAVENOUS | Status: AC
  Administered 2021-06-04 (×2): 2 g via INTRAVENOUS
  Filled 2021-06-04 (×2): qty 50

## 2021-06-04 NOTE — Discharge Summary (Signed)
Physician Discharge Summary   Nichole Munoz ZOX:[id number] DOB: 05/06/1945 DOA: 06/03/2021  PCP: [facility]  Admit date: 06/03/2021 Discharge date:  06/04/2021  Admitted From: Home Disposition:  Home Discharging physician: Dwyane Dee, MD  Recommendations for Outpatient Follow-up:  Continue with [place]: HHPT Equipment/Devices: RW  Discharge Condition: stable CODE STATUS: Full Diet recommendation:  Diet Orders (From admission, onward)     Start     Ordered   06/04/21 1011  Diet regular Room service appropriate? Yes; Fluid consistency: Thin  Diet effective now       Question Answer Comment  Room service appropriate? Yes   Fluid consistency: Thin      06/04/21 1010   06/04/21 0000  Diet Carb Modified        06/04/21 1241            Hospital Course:  Assessment and Plan: * Hyperosmolar hyperglycemic state (HHS) (HCC)-resolved as of 06/04/2021 - Concern for either lives at home or improper administration.  Given her underlying mentation concerns, suspect she may be missing some doses.  She does live with her son - Responded well to insulin drip and fluids - Transitioned to subcutaneous insulin prior to discharge   Acute renal failure superimposed on stage 3a chronic kidney disease (HCC) Baseline SCr 1.4.  -Responded well to fluids and creatinine down trended  Acute metabolic encephalopathy Multifactorial etiology.  Present on admission.  Likely due to hyperosmolar hyperglycemic state, acute renal failure.  Possible UTI.  - UA suggestive of UTI; started on empiric abx course - mentation seems improved and essentially back to her baseline; discussed with daughter on phone prior to discharge   Insulin dependent type 2 diabetes mellitus ([place]) - A1c 12.2% on admission - Concern for missed doses at home especially in the setting of concern for underlying cognitive impairments - lives with son - continue insulin regimen and follow up at  discharge   Benign essential HTN - Resume home meds  Stage 3a chronic kidney disease (CKD) ([place]) - baseline SCr 1.4 Acutely worsened due to likely dehydration from HHS.  Baseline creatinine 1.4.   Hyponatremia-resolved as of 06/04/2021 Pseudohyponatremia due to hyperglycemia.   Principal Diagnosis: Hyperosmolar hyperglycemic state (HHS) [facility])  Discharge Diagnoses: Active Problems:   Benign essential HTN   Insulin dependent type 2 diabetes mellitus (HCC)   Acute renal failure superimposed on stage 3a chronic kidney disease (HCC)   Acute metabolic encephalopathy   Stage 3a chronic kidney disease (CKD) (HCC) - baseline SCr 1.4   Discharge Instructions     Diet Carb Modified   Complete by: As directed    Increase activity slowly   Complete by: As directed       Allergies as of 06/04/2021       Reactions   Other Other (See Comments)   Chicken causes itching/  clorox, Bleach causes shortness of breath   Chicken Allergy    No Known Allergies    Previous listing of unable to tolerate SMELL of Clorox and Chicken REACTION NOT AN ALLERGY         Medication List     STOP taking these medications    metFORMIN 500 MG tablet Commonly known as: GLUCOPHAGE   rosuvastatin 20 MG tablet Commonly known as: CRESTOR       TAKE these medications    albuterol 108 (90 Base) MCG/ACT inhaler Commonly known as: VENTOLIN HFA Inhale 2 puffs into the lungs every 6 (six)  hours as needed for wheezing or shortness of breath.   amLODipine 5 MG tablet Commonly known as: NORVASC Take 1 tablet (5 mg total) by mouth daily.   aspirin EC 81 MG tablet Take 1 tablet (81 mg total) by mouth daily.   atorvastatin 40 MG tablet Commonly known as: LIPITOR Take 1 tablet (40 mg total) by mouth daily.   blood glucose meter kit and supplies Dispense based on patient and insurance preference. Use up to four times daily as directed. (FOR ICD-10 E10.9, E11.9).   cefdinir 300 MG capsule Commonly  known as: OMNICEF Take 1 capsule (300 mg total) by mouth daily for 3 days. Start taking on: Jun 05, 2021   [employer] Iron 325 (65 FE) MG tablet Generic drug: ferrous sulfate Take 325 mg by mouth daily.   diclofenac Sodium 1 % Gel Commonly known as: Voltaren Apply 2 g topically 4 (four) times daily. What changed:  when to take this reasons to take this   gabapentin 300 MG capsule Commonly known as: NEURONTIN Take 300 mg by mouth 2 (two) times daily.   glipiZIDE 5 MG tablet Commonly known as: GLUCOTROL Take 5 mg by mouth 2 (two) times daily.   insulin aspart 100 UNIT/ML FlexPen Commonly known as: NOVOLOG Inject 1-9 Units into the skin 4 (four) times daily -  before meals and at bedtime. Glucose 121 - 150: 1 unit, Glucose 151 - 200: 2 units, Glucose 201 - 250: 3 units, Glucose 251 - 300: 5 units, Glucose 301 - 350: 7 units, Glucose 351 - 400: 9 units, Glucose > 400 call MD   insulin glargine 100 UNIT/ML injection Commonly known as: LANTUS Inject 0.2 mLs (20 Units total) into the skin 2 (two) times daily.   Insulin Pen Needle 31G X 5 MM Misc Use up to 4 times daily with insulin   lisinopril 10 MG tablet Commonly known as: ZESTRIL Take 10 mg by mouth daily.   pantoprazole 40 MG tablet Commonly known as: PROTONIX Take 1 tablet (40 mg total) by mouth at bedtime.               Durable Medical Equipment  (From admission, onward)           Start     Ordered   06/04/21 1442  For home use only DME Walker rolling  Once       Question Answer Comment  Walker: With 5 Inch Wheels   Patient needs a walker to treat with the following condition Generalized muscle weakness      06/04/21 1441            Follow-up [place], [facility] Follow up.   Why: Your durable medical equipment rolling walker Contact information: [address]  [address] [phone]         [facility] [place] Follow up.   Why: your home  health provider Contact information: [place] [address] [phone]                Allergies  Allergen Reactions   Other Other (See Comments)    Chicken causes itching/  clorox, Bleach causes shortness of breath   Chicken Allergy    No Known Allergies     Previous listing of unable to tolerate SMELL of Clorox and Chicken REACTION NOT AN ALLERGY     Consultations:   Procedures:   Discharge Exam: BP (!) 162/97 (BP Location:  Right Arm)   Pulse 90   Temp 97.6 F (36.4 C) (Oral)   Resp 15   Wt 57.3 kg   SpO2 100%   BMI 24.67 kg/m  Physical Exam Constitutional:      General: She is not in acute distress.    Appearance: Normal appearance. She is not ill-appearing.  HENT:     Head: Normocephalic.     Mouth/Throat:     Mouth: Mucous membranes are moist.  Eyes:     Extraocular Movements: Extraocular movements intact.  Cardiovascular:     Rate and Rhythm: Normal rate and regular rhythm.  Pulmonary:     Effort: Pulmonary effort is normal.     Breath sounds: Normal breath sounds.  Abdominal:     General: Bowel sounds are normal. There is no distension.     Palpations: Abdomen is soft.     Tenderness: There is no abdominal tenderness.  Musculoskeletal:        General: Normal range of motion.     Cervical back: Normal range of motion and neck supple.  Skin:    General: Skin is warm and dry.  Neurological:     Mental Status: She is alert. Mental status is at baseline. She is disoriented.     The results of significant diagnostics from this hospitalization (including imaging, microbiology, ancillary and laboratory) are listed below for reference.   Microbiology: No results found for this or any previous visit (from the past 240 hour(s)).   Labs: BNP (last 3 results) No results for input(s): BNP in the last 8760 hours. Basic Metabolic Panel: Recent Labs  Lab 06/03/21 1521 06/03/21 1555 06/03/21 2108 06/04/21 0203 06/04/21 0730  06/04/21 1125  NA 129* 127* 130* 135 135 137  K 5.1 5.0 5.1 3.7 3.5 3.5  CL 91*  --  96* 99 98 98  CO2 27  --  20* $Re'25 28 26  'KVM$ GLUCOSE 732*  --  592* 122* 167* 165*  BUN 44*  --  43* 37* 34* 32*  CREATININE 3.05*  --  2.59* 2.26* 2.04* 1.90*  CALCIUM 9.6  --  9.0 9.3 9.7 9.8  MG  --   --   --  1.2*  --   --    Liver Function Tests: Recent Labs  Lab 06/03/21 1521 06/04/21 0203  AST 17 22  ALT 16 13  ALKPHOS 99 76  BILITOT 1.1 0.6  PROT 8.2* 7.1  ALBUMIN 3.9 3.5   No results for input(s): LIPASE, AMYLASE in the last 168 hours. No results for input(s): AMMONIA in the last 168 hours. CBC: Recent Labs  Lab 06/03/21 1521 06/03/21 1555 06/04/21 0203  WBC 8.3  --  10.8*  NEUTROABS 6.7  --  6.5  HGB 12.4 12.9 11.0*  HCT 35.0* 38.0 31.3*  MCV 84.1  --  84.4  PLT 240  --  223   Cardiac Enzymes: No results for input(s): CKTOTAL, CKMB, CKMBINDEX, TROPONINI in the last 168 hours. BNP: Invalid input(s): POCBNP CBG: Recent Labs  Lab 06/04/21 0925 06/04/21 1016 06/04/21 1300 06/04/21 1358 06/04/21 1612  GLUCAP 143* 136* 296* 320* 334*   D-Dimer No results for input(s): DDIMER in the last 72 hours. Hgb A1c Recent Labs    06/03/21 2108  HGBA1C 12.2*   Lipid Profile No results for input(s): CHOL, HDL, LDLCALC, TRIG, CHOLHDL, LDLDIRECT in the last 72 hours. Thyroid function studies No results for input(s): TSH, T4TOTAL, T3FREE, THYROIDAB in the last 72 hours.  Invalid  input(s): FREET3 Anemia work up No results for input(s): VITAMINB12, FOLATE, FERRITIN, TIBC, IRON, RETICCTPCT in the last 72 hours. Urinalysis    Component Value Date/Time   COLORURINE YELLOW 06/03/2021 2026   APPEARANCEUR HAZY (A) 06/03/2021 2026   LABSPEC 1.017 06/03/2021 2026   PHURINE 7.0 06/03/2021 2026   GLUCOSEU >=500 (A) 06/03/2021 2026   HGBUR SMALL (A) 06/03/2021 2026   BILIRUBINUR NEGATIVE 06/03/2021 2026   KETONESUR 5 (A) 06/03/2021 2026   PROTEINUR 100 (A) 06/03/2021 2026    UROBILINOGEN 1.0 09/23/2014 1458   NITRITE NEGATIVE 06/03/2021 2026   LEUKOCYTESUR TRACE (A) 06/03/2021 2026   Sepsis Labs Invalid input(s): PROCALCITONIN,  WBC,  LACTICIDVEN Microbiology No results found for this or any previous visit (from the past 240 hour(s)).  Procedures/Studies: CT ABDOMEN PELVIS WO CONTRAST  Result Date: 06/03/2021 CLINICAL DATA:  Found down, abdominal pain, nausea/vomiting EXAM: CT ABDOMEN AND PELVIS WITHOUT CONTRAST TECHNIQUE: Multidetector CT imaging of the abdomen and pelvis was performed following the standard protocol without IV contrast. RADIATION DOSE REDUCTION: This exam was performed according to the departmental dose-optimization program which includes automated exposure control, adjustment of the mA and/or kV according to patient size and/or use of iterative reconstruction technique. COMPARISON:  09/04/2019 FINDINGS: Motion degraded images. Lower chest: Lung bases are clear. Hepatobiliary: Unenhanced liver is unremarkable. Tiny layering gallstone (series 13/image 28), without associated inflammatory changes. Pancreas: Within normal limits. Spleen: Within normal limits. Adrenals/Urinary Tract: Adrenal glands are within normal limits. Kidneys are within normal limits. No renal, ureteral, or bladder calculi. No hydronephrosis. Mildly thick-walled bladder with nondependent gas (series 13/image 80). Stomach/Bowel: Stomach is within normal limits. No evidence of bowel obstruction. Normal appendix (series 13/image 67). Sigmoid diverticulosis, without evidence of diverticulitis. Vascular/Lymphatic: No evidence of abdominal aortic aneurysm. Atherosclerotic calcifications of the abdominal aorta and branch vessels. No suspicious abdominopelvic lymphadenopathy. Reproductive: Uterus is within normal limits. Bilateral ovaries are within normal limits. Other: No abdominopelvic ascites. Musculoskeletal: Degenerative changes of the lumbar spine. Grade 1 spondylolisthesis at L5-S1. No  acute fracture is seen. IMPRESSION: Motion degraded images. Mildly thick-walled bladder with nondependent gas, correlate for cystitis. Cholelithiasis, without associated inflammatory changes. Electronically Signed   By: Julian Hy M.D.   On: 06/03/2021 17:54   CT Head Wo Contrast  Result Date: 06/03/2021 CLINICAL DATA:  Unwitnessed fall, found down EXAM: CT HEAD WITHOUT CONTRAST CT CERVICAL SPINE WITHOUT CONTRAST TECHNIQUE: Multidetector CT imaging of the head and cervical spine was performed following the standard protocol without intravenous contrast. Multiplanar CT image reconstructions of the cervical spine were also generated. RADIATION DOSE REDUCTION: This exam was performed according to the departmental dose-optimization program which includes automated exposure control, adjustment of the mA and/or kV according to patient size and/or use of iterative reconstruction technique. COMPARISON:  CT head dated 01/14/2021 FINDINGS: CT HEAD FINDINGS Brain: No evidence of acute infarction, hemorrhage, hydrocephalus, extra-axial collection or mass lesion/mass effect. Old right basal ganglia lacunar infarct. Subcortical white matter and periventricular small vessel ischemic changes. Vascular: Intracranial atherosclerosis. Skull: Normal. Negative for fracture or focal lesion. Sinuses/Orbits: The visualized paranasal sinuses are essentially clear. The mastoid air cells are unopacified. Other: None. CT CERVICAL SPINE FINDINGS Alignment: Straightening of the cervical spine, likely positional. Skull base and vertebrae: No acute fracture. No primary bone lesion or focal pathologic process. Soft tissues and spinal canal: No prevertebral fluid or swelling. No visible canal hematoma. Disc levels: Intervertebral disc spaces are maintained. Spinal canal is patent. Upper chest: Visualized lung apices are clear.  Other: Visualized thyroid is unremarkable. IMPRESSION: No evidence of acute intracranial abnormality. Old right  basal ganglia lacunar infarct. Small vessel ischemic changes. No evidence of acute traumatic injury to the cervical spine. Electronically Signed   By: Julian Hy M.D.   On: 06/03/2021 17:49   CT Cervical Spine Wo Contrast  Result Date: 06/03/2021 CLINICAL DATA:  Unwitnessed fall, found down EXAM: CT HEAD WITHOUT CONTRAST CT CERVICAL SPINE WITHOUT CONTRAST TECHNIQUE: Multidetector CT imaging of the head and cervical spine was performed following the standard protocol without intravenous contrast. Multiplanar CT image reconstructions of the cervical spine were also generated. RADIATION DOSE REDUCTION: This exam was performed according to the departmental dose-optimization program which includes automated exposure control, adjustment of the mA and/or kV according to patient size and/or use of iterative reconstruction technique. COMPARISON:  CT head dated 01/14/2021 FINDINGS: CT HEAD FINDINGS Brain: No evidence of acute infarction, hemorrhage, hydrocephalus, extra-axial collection or mass lesion/mass effect. Old right basal ganglia lacunar infarct. Subcortical white matter and periventricular small vessel ischemic changes. Vascular: Intracranial atherosclerosis. Skull: Normal. Negative for fracture or focal lesion. Sinuses/Orbits: The visualized paranasal sinuses are essentially clear. The mastoid air cells are unopacified. Other: None. CT CERVICAL SPINE FINDINGS Alignment: Straightening of the cervical spine, likely positional. Skull base and vertebrae: No acute fracture. No primary bone lesion or focal pathologic process. Soft tissues and spinal canal: No prevertebral fluid or swelling. No visible canal hematoma. Disc levels: Intervertebral disc spaces are maintained. Spinal canal is patent. Upper chest: Visualized lung apices are clear. Other: Visualized thyroid is unremarkable. IMPRESSION: No evidence of acute intracranial abnormality. Old right basal ganglia lacunar infarct. Small vessel ischemic  changes. No evidence of acute traumatic injury to the cervical spine. Electronically Signed   By: Julian Hy M.D.   On: 06/03/2021 17:49     Time coordinating discharge: Over 30 minutes    Dwyane Dee, MD  [facility] 06/04/2021, 4:29 PM

## 2021-06-04 NOTE — Progress Notes (Addendum)
Inpatient Diabetes Program Recommendations  AACE/ADA: New Consensus Statement on Inpatient Glycemic Control   Target Ranges:  Prepandial:   less than 140 mg/dL      Peak postprandial:   less than 180 mg/dL (1-2 hours)      Critically ill patients:  140 - 180 mg/dL    Latest Reference Range & Units 06/04/21 01:14 06/04/21 02:22 06/04/21 03:13 06/04/21 05:24 06/04/21 06:19 06/04/21 07:22 06/04/21 08:12  Glucose-Capillary 70 - 99 mg/dL 163 (H) 163 (H) 142 (H) 213 (H) 193 (H) 183 (H) 146 (H)    Latest Reference Range & Units 06/03/21 15:21  Beta-Hydroxybutyric Acid 0.05 - 0.27 mmol/L 1.13 (H)  Glucose 70 - 99 mg/dL 732 (HH)    Latest Reference Range & Units 06/03/21 21:08  Hemoglobin A1C 4.8 - 5.6 % 12.2 (H)   Review of Glycemic Control  Diabetes history: DM2 Outpatient Diabetes medications: Lantus 20 units BID, Glipizide 5 mg BID, Metformin 500 mg BID Current orders for Inpatient glycemic control: IV insulin  Inpatient Diabetes Program Recommendations:    Insulin: Once provider is ready to transition from IV to SQ insulin, please consider ordering Semglee 14 units Q24H, CBGs Q4H, and Novolog 0-6 units Q4H.  HbgA1C:  A1C 12.2% on 06/03/21 indicating an average glucose of 303 mg/dl over the past 2-3 months.  Addendum 06/04/28@14 :25- Spoke with patient at bedside (with interpreter on tablet in room; Sonita #[id number]) about diabetes and home regimen for diabetes control. Patient has safety sitter in room at bedside. Patient reports that she takes a lot of medicine at home and can not remember the names of them. Patient states that she takes insulin shot once a day along with pills. Patient is not able to provide dose of insulin she takes. Patient reports that she gives herself insulin and sometimes her family gives her insulin. Patient reported that glucose can go up to 300-400's mg/dl when she goes without insulin for 3-4 days. Asked why she goes without insulin for days but patient did not  provide any other details. Patient was not able to provide answer to many questions and interpreter stated that patient is saying random things that don't make sense together. Asked about family that helps her at home and patient initially stated that her cousin helps her and then stated that her child Souen helped her. She stated that her family works all day. Inquired if I could call family to get more information and patient stated that was fine to call family. Patient verbalized understanding of information discussed and reports no further questions at this time related to diabetes. Called phone number in chart for patient's daughter Ashyla Luth and her son [name] Wool but no answer on either phone and not able to leave a message.  Thanks, Barnie Alderman, RN, MSN, [place] [facility] Coordinator Inpatient Diabetes Program [phone] (Team Pager from 8am to [place])

## 2021-06-04 NOTE — Care Management (Signed)
Called son ( daughter earlier) no message could be left, mailbox not set up. Texted son confidential message to call back. Unable to give Observation notification at this time.

## 2021-06-04 NOTE — TOC Transition Note (Signed)
Transition of Care [facility]) - CM/SW Discharge Note   Patient Details  Name: Nichole Munoz MRN: [id number] Date of Birth: Sep 01, 1945  [facility] CM/SW Contact:  Verdell Carmine, RN Phone Number: 06/04/2021, 3:16 PM   Clinical Narrative:      Patient to be DC today, still trying to get a hold of family membes for transport. Ordered RW for patient from [facility] as well as [place] with [facility]. For PT. Sharmon Revere is aware of patients language nad that son will be contact for needs.    Barriers to Discharge: Transportation   Patient Goals and CMS Choice        Discharge Placement                       Discharge Plan and Services   Discharge Planning Services: CM Consult            DME Arranged: Gilford Rile rolling DME Agency: [facility] Date DME Agency Contacted: 06/04/21 Time DME Agency Contacted: [phone] Representative spoke with at DME Agency: [facility]            Social Determinants of Health ([place]) Interventions     Readmission Risk Interventions     View : No data to display.

## 2021-06-04 NOTE — Progress Notes (Signed)
Pt. Is agitated about not having purse and wanting to leave hospital. Tried to notify family with no answer. Text paged MD and awaiting reply. Continuing to communicate via interpreter and calm pt.down. Will continue to monitor closely.

## 2021-06-04 NOTE — Progress Notes (Signed)
Mobility Specialist Progress Note    06/04/21 1321  Mobility  Activity Ambulated with assistance in hallway  Level of Assistance Contact guard assist, steadying assist  Assistive Device Front wheel walker  Distance Ambulated (ft) 470 ft  Activity Response Tolerated well  $Mobility charge 1 Mobility   Pt received in bed and agreeable. No complaints on walk. Significantly unsteady on her feet and required heavy contact guard. Returned to bed with call bell in reach.    Hildred Alamin Mobility Specialist  Primary: 5N M.S. Phone: [phone] Secondary: 6N M.S. Phone: [phone]

## 2021-06-04 NOTE — Assessment & Plan Note (Signed)
-   A1c 12.2% on admission - Concern for missed doses at home especially in the setting of concern for underlying cognitive impairments - lives with son - continue insulin regimen and follow up at discharge

## 2021-06-11 ENCOUNTER — Emergency Department (HOSPITAL_COMMUNITY)
Admission: EM | Admit: 2021-06-11 | Discharge: 2021-06-12 | Disposition: A | Discharge status: Home / Self Care | Payer: Medicare Other | Attending: Emergency Medicine | Admitting: Emergency Medicine

## 2021-06-11 DIAGNOSIS — E1165 Type 2 diabetes mellitus with hyperglycemia: Secondary | ICD-10-CM | POA: Diagnosis not present

## 2021-06-11 DIAGNOSIS — I10 Essential (primary) hypertension: Secondary | ICD-10-CM | POA: Diagnosis not present

## 2021-06-11 DIAGNOSIS — Z794 Long term (current) use of insulin: Secondary | ICD-10-CM | POA: Insufficient documentation

## 2021-06-11 DIAGNOSIS — R739 Hyperglycemia, unspecified: Secondary | ICD-10-CM

## 2021-06-11 DIAGNOSIS — J45909 Unspecified asthma, uncomplicated: Secondary | ICD-10-CM | POA: Insufficient documentation

## 2021-06-11 DIAGNOSIS — Z7982 Long term (current) use of aspirin: Secondary | ICD-10-CM | POA: Insufficient documentation

## 2021-06-11 DIAGNOSIS — D649 Anemia, unspecified: Secondary | ICD-10-CM | POA: Diagnosis not present

## 2021-06-11 DIAGNOSIS — Z79899 Other long term (current) drug therapy: Secondary | ICD-10-CM | POA: Insufficient documentation

## 2021-06-11 DIAGNOSIS — R112 Nausea with vomiting, unspecified: Secondary | ICD-10-CM | POA: Diagnosis present

## 2021-06-11 DIAGNOSIS — Z7984 Long term (current) use of oral hypoglycemic drugs: Secondary | ICD-10-CM | POA: Insufficient documentation

## 2021-06-11 LAB — URINALYSIS, ROUTINE W REFLEX MICROSCOPIC
Bilirubin Urine: NEGATIVE
Glucose, UA: >=500 mg/dL — AB
Ketones, ur: NEGATIVE mg/dL
Leukocytes,Ua: NEGATIVE
Nitrite: NEGATIVE
Protein, ur: 100 mg/dL — AB
Specific Gravity, Urine: 1.014 (ref 1.005–1.030)
pH: 9 — ABNORMAL HIGH (ref 5.0–8.0)

## 2021-06-11 LAB — CBC
HCT: 34.3 % — ABNORMAL LOW (ref 36.0–46.0)
Hemoglobin: 11.8 g/dL — ABNORMAL LOW (ref 12.0–15.0)
MCH: 29.2 pg (ref 26.0–34.0)
MCHC: 34.4 g/dL (ref 30.0–36.0)
MCV: 84.9 fL (ref 80.0–100.0)
Platelets: 220 10*3/uL (ref 150–400)
RBC: 4.04 MIL/uL (ref 3.87–5.11)
RDW: 10.9 % — ABNORMAL LOW (ref 11.5–15.5)
WBC: 9 10*3/uL (ref 4.0–10.5)
nRBC: 0 % (ref 0.0–0.2)

## 2021-06-11 LAB — BASIC METABOLIC PANEL
Anion gap: 9 (ref 5–15)
BUN: 23 mg/dL (ref 8–23)
CO2: 29 mmol/L (ref 22–32)
Calcium: 9.3 mg/dL (ref 8.9–10.3)
Chloride: 97 mmol/L — ABNORMAL LOW (ref 98–111)
Creatinine, Ser: 1.53 mg/dL — ABNORMAL HIGH (ref 0.44–1.00)
GFR, Estimated: 35 mL/min — ABNORMAL LOW (ref >60)
Glucose, Bld: 367 mg/dL — ABNORMAL HIGH (ref 70–99)
Potassium: 4.3 mmol/L (ref 3.5–5.1)
Sodium: 135 mmol/L (ref 135–145)

## 2021-06-11 LAB — BLOOD GAS, VENOUS
Acid-Base Excess: 5.8 mmol/L — ABNORMAL HIGH (ref 0.0–2.0)
Bicarbonate: 31.7 mmol/L — ABNORMAL HIGH (ref 20.0–28.0)
O2 Saturation: 70.9 %
Patient temperature: 37
pCO2, Ven: 50 mmHg (ref 44–60)
pH, Ven: 7.41 (ref 7.25–7.43)
pO2, Ven: 40 mmHg (ref 32–45)

## 2021-06-11 LAB — HEPATIC FUNCTION PANEL
ALT: 18 U/L (ref 0–44)
AST: 20 U/L (ref 15–41)
Albumin: 3.9 g/dL (ref 3.5–5.0)
Alkaline Phosphatase: 74 U/L (ref 38–126)
Bilirubin, Direct: 0.1 mg/dL (ref 0.0–0.2)
Indirect Bilirubin: 0.7 mg/dL (ref 0.3–0.9)
Total Bilirubin: 0.8 mg/dL (ref 0.3–1.2)
Total Protein: 7.9 g/dL (ref 6.5–8.1)

## 2021-06-11 LAB — CBG MONITORING, ED
Glucose-Capillary: 348 mg/dL — ABNORMAL HIGH (ref 70–99)
Glucose-Capillary: 363 mg/dL — ABNORMAL HIGH (ref 70–99)

## 2021-06-11 LAB — LIPASE, BLOOD: Lipase: 37 U/L (ref 11–51)

## 2021-06-11 MED ORDER — SODIUM CHLORIDE 0.9 % IV BOLUS
1000.0000 mL | Freq: Once | INTRAVENOUS | Status: AC
  Administered 2021-06-11: 1000 mL via INTRAVENOUS
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
## 2021-06-11 NOTE — ED Triage Notes (Addendum)
Pt in via EMS with c/o n/v. Pt was admitted for same a few days ago. Pt states that she does not know if she is diabetic. BGL via EMS was 509 after 500cc fluid. Pt speaks [place]

## 2021-06-12 DIAGNOSIS — E1165 Type 2 diabetes mellitus with hyperglycemia: Secondary | ICD-10-CM | POA: Diagnosis not present

## 2021-06-12 LAB — CBG MONITORING, ED
Glucose-Capillary: 249 mg/dL — ABNORMAL HIGH (ref 70–99)
Glucose-Capillary: 309 mg/dL — ABNORMAL HIGH (ref 70–99)

## 2021-06-12 MED ORDER — INSULIN ASPART 100 UNIT/ML IJ SOLN
8.0000 [IU] | Freq: Once | INTRAMUSCULAR | Status: AC
  Administered 2021-06-12: 8 [IU] via SUBCUTANEOUS
  Filled 2021-06-12: qty 0.08

## 2021-06-12 NOTE — ED Notes (Signed)
Attempted to reach son x1 to pick up pt.

## 2021-06-12 NOTE — ED Notes (Signed)
Pt family contacted to pick up pt for discharge.

## 2021-06-12 NOTE — ED Notes (Signed)
Provider attempted calling family with the number provided by dispatch. No answer

## 2021-06-12 NOTE — Discharge Instructions (Signed)
Nichole Munoz was seen in the ER today for her nausea.  Her blood sugar was very high but otherwise her labs look normal.  We controlled her blood sugar with insulin and fluids.  Please continue to use her insulin as prescribed at home return to the ER with any new severe symptoms.

## 2021-06-12 NOTE — ED Notes (Signed)
Attempted to call son x2. No response and VM box not set up

## 2021-07-02 ENCOUNTER — Ambulatory Visit (INDEPENDENT_AMBULATORY_CARE_PROVIDER_SITE_OTHER): Payer: Medicare Other | Admitting: Podiatry

## 2021-07-02 DIAGNOSIS — Z91199 Patient's noncompliance with other medical treatment and regimen due to unspecified reason: Secondary | ICD-10-CM

## 2021-07-23 ENCOUNTER — Encounter (HOSPITAL_COMMUNITY): Payer: Self-pay

## 2021-07-23 ENCOUNTER — Emergency Department (HOSPITAL_COMMUNITY): Payer: Medicare Other

## 2021-07-23 ENCOUNTER — Inpatient Hospital Stay (HOSPITAL_COMMUNITY)
Admission: EM | Admit: 2021-07-23 | Discharge: 2021-07-28 | DRG: 057 | Disposition: A | Discharge status: Home Health | Payer: Medicare Other | Attending: Internal Medicine | Admitting: Internal Medicine

## 2021-07-23 DIAGNOSIS — E782 Mixed hyperlipidemia: Secondary | ICD-10-CM | POA: Diagnosis present

## 2021-07-23 DIAGNOSIS — Z794 Long term (current) use of insulin: Secondary | ICD-10-CM | POA: Diagnosis not present

## 2021-07-23 DIAGNOSIS — R259 Unspecified abnormal involuntary movements: Secondary | ICD-10-CM | POA: Diagnosis not present

## 2021-07-23 DIAGNOSIS — E11649 Type 2 diabetes mellitus with hypoglycemia without coma: Secondary | ICD-10-CM | POA: Diagnosis not present

## 2021-07-23 DIAGNOSIS — E1165 Type 2 diabetes mellitus with hyperglycemia: Secondary | ICD-10-CM | POA: Diagnosis not present

## 2021-07-23 DIAGNOSIS — Z7982 Long term (current) use of aspirin: Secondary | ICD-10-CM | POA: Diagnosis not present

## 2021-07-23 DIAGNOSIS — N184 Chronic kidney disease, stage 4 (severe): Secondary | ICD-10-CM | POA: Diagnosis present

## 2021-07-23 DIAGNOSIS — E869 Volume depletion, unspecified: Secondary | ICD-10-CM | POA: Diagnosis present

## 2021-07-23 DIAGNOSIS — N179 Acute kidney failure, unspecified: Secondary | ICD-10-CM | POA: Diagnosis not present

## 2021-07-23 DIAGNOSIS — N1831 Chronic kidney disease, stage 3a: Secondary | ICD-10-CM | POA: Diagnosis present

## 2021-07-23 DIAGNOSIS — Z833 Family history of diabetes mellitus: Secondary | ICD-10-CM

## 2021-07-23 DIAGNOSIS — T43025A Adverse effect of tetracyclic antidepressants, initial encounter: Secondary | ICD-10-CM | POA: Diagnosis not present

## 2021-07-23 DIAGNOSIS — E1140 Type 2 diabetes mellitus with diabetic neuropathy, unspecified: Secondary | ICD-10-CM | POA: Diagnosis not present

## 2021-07-23 DIAGNOSIS — I10 Essential (primary) hypertension: Secondary | ICD-10-CM | POA: Diagnosis not present

## 2021-07-23 DIAGNOSIS — T50905A Adverse effect of unspecified drugs, medicaments and biological substances, initial encounter: Secondary | ICD-10-CM | POA: Diagnosis present

## 2021-07-23 DIAGNOSIS — R509 Fever, unspecified: Secondary | ICD-10-CM | POA: Diagnosis not present

## 2021-07-23 DIAGNOSIS — E1142 Type 2 diabetes mellitus with diabetic polyneuropathy: Secondary | ICD-10-CM | POA: Diagnosis present

## 2021-07-23 DIAGNOSIS — R29810 Facial weakness: Secondary | ICD-10-CM | POA: Diagnosis not present

## 2021-07-23 DIAGNOSIS — I639 Cerebral infarction, unspecified: Secondary | ICD-10-CM | POA: Diagnosis not present

## 2021-07-23 DIAGNOSIS — M6282 Rhabdomyolysis: Secondary | ICD-10-CM | POA: Diagnosis not present

## 2021-07-23 DIAGNOSIS — G2571 Drug induced akathisia: Secondary | ICD-10-CM | POA: Diagnosis not present

## 2021-07-23 DIAGNOSIS — G4700 Insomnia, unspecified: Secondary | ICD-10-CM | POA: Diagnosis present

## 2021-07-23 DIAGNOSIS — E876 Hypokalemia: Secondary | ICD-10-CM | POA: Diagnosis not present

## 2021-07-23 DIAGNOSIS — K219 Gastro-esophageal reflux disease without esophagitis: Secondary | ICD-10-CM | POA: Diagnosis present

## 2021-07-23 DIAGNOSIS — E440 Moderate protein-calorie malnutrition: Secondary | ICD-10-CM | POA: Diagnosis not present

## 2021-07-23 DIAGNOSIS — E785 Hyperlipidemia, unspecified: Secondary | ICD-10-CM | POA: Diagnosis present

## 2021-07-23 DIAGNOSIS — E1122 Type 2 diabetes mellitus with diabetic chronic kidney disease: Secondary | ICD-10-CM | POA: Diagnosis not present

## 2021-07-23 DIAGNOSIS — Z79899 Other long term (current) drug therapy: Secondary | ICD-10-CM

## 2021-07-23 DIAGNOSIS — E1169 Type 2 diabetes mellitus with other specified complication: Secondary | ICD-10-CM | POA: Diagnosis present

## 2021-07-23 DIAGNOSIS — G255 Other chorea: Secondary | ICD-10-CM | POA: Diagnosis not present

## 2021-07-23 DIAGNOSIS — R531 Weakness: Secondary | ICD-10-CM | POA: Diagnosis not present

## 2021-07-23 DIAGNOSIS — R29818 Other symptoms and signs involving the nervous system: Secondary | ICD-10-CM | POA: Diagnosis not present

## 2021-07-23 DIAGNOSIS — Z7984 Long term (current) use of oral hypoglycemic drugs: Secondary | ICD-10-CM

## 2021-07-23 DIAGNOSIS — Z20822 Contact with and (suspected) exposure to covid-19: Secondary | ICD-10-CM | POA: Diagnosis not present

## 2021-07-23 DIAGNOSIS — R6889 Other general symptoms and signs: Secondary | ICD-10-CM | POA: Diagnosis not present

## 2021-07-23 DIAGNOSIS — Z743 Need for continuous supervision: Secondary | ICD-10-CM | POA: Diagnosis not present

## 2021-07-23 DIAGNOSIS — J45909 Unspecified asthma, uncomplicated: Secondary | ICD-10-CM | POA: Diagnosis not present

## 2021-07-23 DIAGNOSIS — R2990 Unspecified symptoms and signs involving the nervous system: Principal | ICD-10-CM

## 2021-07-23 DIAGNOSIS — Z6824 Body mass index (BMI) 24.0-24.9, adult: Secondary | ICD-10-CM

## 2021-07-23 DIAGNOSIS — Z7951 Long term (current) use of inhaled steroids: Secondary | ICD-10-CM

## 2021-07-23 DIAGNOSIS — G2401 Drug induced subacute dyskinesia: Secondary | ICD-10-CM | POA: Diagnosis present

## 2021-07-23 DIAGNOSIS — Z8673 Personal history of transient ischemic attack (TIA), and cerebral infarction without residual deficits: Secondary | ICD-10-CM | POA: Diagnosis not present

## 2021-07-23 DIAGNOSIS — R350 Frequency of micturition: Secondary | ICD-10-CM | POA: Diagnosis present

## 2021-07-23 DIAGNOSIS — I129 Hypertensive chronic kidney disease with stage 1 through stage 4 chronic kidney disease, or unspecified chronic kidney disease: Secondary | ICD-10-CM | POA: Diagnosis not present

## 2021-07-23 DIAGNOSIS — I499 Cardiac arrhythmia, unspecified: Secondary | ICD-10-CM | POA: Diagnosis not present

## 2021-07-23 DIAGNOSIS — R569 Unspecified convulsions: Secondary | ICD-10-CM | POA: Diagnosis not present

## 2021-07-23 LAB — BASIC METABOLIC PANEL
Anion gap: 13 (ref 5–15)
BUN: 31 mg/dL — ABNORMAL HIGH (ref 8–23)
CO2: 24 mmol/L (ref 22–32)
Calcium: 9.2 mg/dL (ref 8.9–10.3)
Chloride: 106 mmol/L (ref 98–111)
Creatinine, Ser: 2.68 mg/dL — ABNORMAL HIGH (ref 0.44–1.00)
GFR, Estimated: 18 mL/min — ABNORMAL LOW (ref >60)
Glucose, Bld: 140 mg/dL — ABNORMAL HIGH (ref 70–99)
Potassium: 4.3 mmol/L (ref 3.5–5.1)
Sodium: 143 mmol/L (ref 135–145)

## 2021-07-23 LAB — CBC
HCT: 32.5 % — ABNORMAL LOW (ref 36.0–46.0)
Hemoglobin: 11.3 g/dL — ABNORMAL LOW (ref 12.0–15.0)
MCH: 29.4 pg (ref 26.0–34.0)
MCHC: 34.8 g/dL (ref 30.0–36.0)
MCV: 84.4 fL (ref 80.0–100.0)
Platelets: 211 10*3/uL (ref 150–400)
RBC: 3.85 MIL/uL — ABNORMAL LOW (ref 3.87–5.11)
RDW: 12 % (ref 11.5–15.5)
WBC: 9.4 10*3/uL (ref 4.0–10.5)
nRBC: 0 % (ref 0.0–0.2)

## 2021-07-23 LAB — SARS CORONAVIRUS 2 BY RT PCR: SARS Coronavirus 2 by RT PCR: NEGATIVE

## 2021-07-23 LAB — CBG MONITORING, ED: Glucose-Capillary: 125 mg/dL — ABNORMAL HIGH (ref 70–99)

## 2021-07-23 MED ORDER — ASPIRIN 325 MG PO TABS
325.0000 mg | ORAL_TABLET | Freq: Once | ORAL | Status: AC
  Administered 2021-07-23: 325 mg via ORAL
  Filled 2021-07-23: qty 1

## 2021-07-23 MED ORDER — LORAZEPAM 2 MG/ML IJ SOLN
0.5000 mg | Freq: Four times a day (QID) | INTRAMUSCULAR | Status: DC | PRN
  Administered 2021-07-23: 0.5 mg via INTRAVENOUS
  Filled 2021-07-23: qty 1

## 2021-07-23 MED ORDER — LORAZEPAM 2 MG/ML IJ SOLN
1.0000 mg | Freq: Once | INTRAMUSCULAR | Status: AC
  Administered 2021-07-23: 1 mg via INTRAVENOUS
  Filled 2021-07-23: qty 1

## 2021-07-23 NOTE — ED Triage Notes (Signed)
Pt BIB [facility] for stroke like symptoms x3 days. PT is moving constantly but has some purposeful movement. EMs stated she was not moving left leg but has been moving it some since arrival.  Son sent pt in because of left sided facial droop.  Ems states that son does not endorse hx of this behavior.    VSS stable

## 2021-07-23 NOTE — ED Notes (Signed)
Unable to obtain an accurate blood pressure due to the patients involuntary movements of her arms.

## 2021-07-23 NOTE — ED Notes (Addendum)
Patient is alert and oriented to her name and location. Does not know her birtday but knows her age. Patient is able to follow commands even with involuntary muscle movements. Patient states these movements started 3 days ago and has never occurred before. Patient states she has fallen several times but does not remember when the most recent fall occurred. Patient also states she has lost her appetite even though her children cook for her.

## 2021-07-23 NOTE — ED Provider Notes (Signed)
[facility] EMERGENCY DEPARTMENT Provider Note   CSN: [id number] Arrival date & time: 07/23/21  1911     History {Add pertinent medical, surgical, social history, OB history to HPI:1} Chief Complaint  Patient presents with   Spasms    Nichole Munoz is a 76 y.o. female.  Presented to the emergency department due to concern for involuntary movements.  Son provides most of history.  On Friday, the son reports that she had complained of numbness in her right arm and leg and then is progressed to involuntary movements of her right arm and leg.  HPI     Home Medications Prior to Admission medications   Medication Sig Start Date End Date Taking? Authorizing Provider  albuterol (PROVENTIL HFA;VENTOLIN HFA) 108 (90 Base) MCG/ACT inhaler Inhale 2 puffs into the lungs every 6 (six) hours as needed for wheezing or shortness of breath. 02/21/18   Rhetta Mura, MD  amLODipine (NORVASC) 5 MG tablet Take 1 tablet (5 mg total) by mouth daily. 04/28/19   Azucena Fallen, MD  aspirin EC 81 MG EC tablet Take 1 tablet (81 mg total) by mouth daily. 04/28/19   Azucena Fallen, MD  atorvastatin (LIPITOR) 40 MG tablet Take 1 tablet (40 mg total) by mouth daily. 04/28/19   Azucena Fallen, MD  blood glucose meter kit and supplies Dispense based on patient and insurance preference. Use up to four times daily as directed. (FOR ICD-10 E10.9, E11.9). 01/26/17   Noralee Stain, DO  [employer] IRON 325 (65 Fe) MG tablet Take 325 mg by mouth daily. 08/12/19   [provider]  diclofenac Sodium (VOLTAREN) 1 % GEL Apply 2 g topically 4 (four) times daily. Patient taking differently: Apply 2 g topically 4 (four) times daily as needed (pain).  02/19/19   Khatri, Hina, PA-C  gabapentin (NEURONTIN) 300 MG capsule Take 300 mg by mouth 2 (two) times daily. 08/12/19   [provider]  glipiZIDE (GLUCOTROL) 5 MG tablet Take 5 mg by mouth 2 (two) times daily. 07/31/19   [provider]  insulin aspart (NOVOLOG) 100 UNIT/ML FlexPen Inject 1-9 Units into the skin 4 (four) times daily -  before meals and at bedtime. Glucose 121 - 150: 1 unit, Glucose 151 - 200: 2 units, Glucose 201 - 250: 3 units, Glucose 251 - 300: 5 units, Glucose 301 - 350: 7 units, Glucose 351 - 400: 9 units, Glucose > 400 call MD 06/04/21   Lewie Chamber, MD  insulin glargine (LANTUS) 100 UNIT/ML injection Inject 0.2 mLs (20 Units total) into the skin 2 (two) times daily. 04/28/19 04/13/21  Azucena Fallen, MD  Insulin Pen Needle 31G X 5 MM MISC Use up to 4 times daily with insulin 01/26/17   Noralee Stain, DO  lisinopril (ZESTRIL) 10 MG tablet Take 10 mg by mouth daily. 07/31/19   [provider]  mirtazapine (REMERON) 30 MG tablet Take 30 mg by mouth at bedtime. 05/26/21   [provider]  pantoprazole (PROTONIX) 40 MG tablet Take 1 tablet (40 mg total) by mouth at bedtime. 04/28/19   Azucena Fallen, MD  rosuvastatin (CRESTOR) 40 MG tablet Take 40 mg by mouth daily. 04/29/21   [provider]  SYMBICORT 160-4.5 MCG/ACT inhaler Inhale 2 puffs into the lungs 2 (two) times daily. 05/26/21   [provider]      Allergies    Other, Chicken allergy, and No known allergies    Review of Systems  Review of Systems  Physical Exam Updated Vital Signs BP (!) 162/136   Pulse 97   Temp 99.5 F (37.5 C) (Oral)   Resp (!) 30   SpO2 97%  Physical Exam  ED Results / Procedures / Treatments   Labs (all labs ordered are listed, but only abnormal results are displayed) Labs Reviewed  BASIC METABOLIC PANEL - Abnormal; Notable for the following components:      Result Value   Glucose, Bld 140 (*)    BUN 31 (*)    Creatinine, Ser 2.68 (*)    GFR, Estimated 18 (*)    All other components within normal limits  CBC - Abnormal; Notable for the following components:   RBC 3.85 (*)    Hemoglobin 11.3 (*)    HCT 32.5 (*)    All other components within normal  limits  CBG MONITORING, ED - Abnormal; Notable for the following components:   Glucose-Capillary 125 (*)    All other components within normal limits  SARS CORONAVIRUS 2 BY RT PCR  URINALYSIS, ROUTINE W REFLEX MICROSCOPIC    EKG None  Radiology CT HEAD WO CONTRAST  Result Date: 07/23/2021 CLINICAL DATA:  Left-sided facial droop. EXAM: CT HEAD WITHOUT CONTRAST TECHNIQUE: Contiguous axial images were obtained from the base of the skull through the vertex without intravenous contrast. RADIATION DOSE REDUCTION: This exam was performed according to the departmental dose-optimization program which includes automated exposure control, adjustment of the mA and/or kV according to patient size and/or use of iterative reconstruction technique. COMPARISON:  CT head dated Jun 03, 2021. FINDINGS: Brain: No evidence of acute infarction, hemorrhage, hydrocephalus, extra-axial collection or mass lesion/mass effect. Stable mild chronic microvascular ischemic changes. Vascular: Calcified atherosclerosis at the skull base. No hyperdense vessel. Skull: Normal. Negative for fracture or focal lesion. Sinuses/Orbits: No acute finding. Other: None. IMPRESSION: 1. No acute intracranial abnormality. Electronically Signed   By: Titus Dubin M.D.   On: 07/23/2021 21:21   DG Chest Portable 1 View  Result Date: 07/23/2021 CLINICAL DATA:  Fever left-sided weakness EXAM: PORTABLE CHEST 1 VIEW COMPARISON:  02/25/2021 FINDINGS: Rotated patient. Right lung grossly clear. Possible mild airspace disease at left base. Stable cardiomediastinal silhouette with aortic atherosclerosis. No pneumothorax. IMPRESSION: Possible mild atelectasis or developing infiltrate at the left base. Electronically Signed   By: Donavan Foil M.D.   On: 07/23/2021 20:17    Procedures Procedures  {Document cardiac monitor, telemetry assessment procedure when appropriate:1}  Medications Ordered in ED Medications  LORazepam (ATIVAN) injection 0.5 mg  (0.5 mg Intravenous Given 07/23/21 2223)  LORazepam (ATIVAN) injection 1 mg (1 mg Intravenous Given 07/23/21 2119)    ED Course/ Medical Decision Making/ A&P                           Medical Decision Making Amount and/or Complexity of Data Reviewed Labs: ordered. Radiology: ordered.  Risk Prescription drug management.   ***  {Document critical care time when appropriate:1} {Document review of labs and clinical decision tools ie heart score, Chads2Vasc2 etc:1}  {Document your independent review of radiology images, and any outside records:1} {Document your discussion with family members, caretakers, and with consultants:1} {Document social determinants of health affecting pt's care:1} {Document your decision making why or why not admission, treatments were needed:1} Final Clinical Impression(s) / ED Diagnoses Final diagnoses:  None    Rx / DC Orders ED Discharge Orders     None

## 2021-07-23 NOTE — ED Notes (Signed)
To MRI

## 2021-07-24 ENCOUNTER — Other Ambulatory Visit: Payer: Self-pay

## 2021-07-24 DIAGNOSIS — E782 Mixed hyperlipidemia: Secondary | ICD-10-CM | POA: Diagnosis not present

## 2021-07-24 DIAGNOSIS — G2571 Drug induced akathisia: Secondary | ICD-10-CM | POA: Diagnosis not present

## 2021-07-24 DIAGNOSIS — E1122 Type 2 diabetes mellitus with diabetic chronic kidney disease: Secondary | ICD-10-CM | POA: Diagnosis not present

## 2021-07-24 DIAGNOSIS — Z794 Long term (current) use of insulin: Secondary | ICD-10-CM | POA: Diagnosis not present

## 2021-07-24 DIAGNOSIS — E1169 Type 2 diabetes mellitus with other specified complication: Secondary | ICD-10-CM

## 2021-07-24 DIAGNOSIS — K219 Gastro-esophageal reflux disease without esophagitis: Secondary | ICD-10-CM | POA: Diagnosis not present

## 2021-07-24 DIAGNOSIS — I10 Essential (primary) hypertension: Secondary | ICD-10-CM

## 2021-07-24 DIAGNOSIS — N1831 Chronic kidney disease, stage 3a: Secondary | ICD-10-CM | POA: Diagnosis not present

## 2021-07-24 DIAGNOSIS — N179 Acute kidney failure, unspecified: Secondary | ICD-10-CM

## 2021-07-24 DIAGNOSIS — T50905A Adverse effect of unspecified drugs, medicaments and biological substances, initial encounter: Secondary | ICD-10-CM

## 2021-07-24 LAB — HEPATIC FUNCTION PANEL
ALT: 20 U/L (ref 0–44)
AST: 41 U/L (ref 15–41)
Albumin: 3.4 g/dL — ABNORMAL LOW (ref 3.5–5.0)
Alkaline Phosphatase: 63 U/L (ref 38–126)
Bilirubin, Direct: 0.1 mg/dL (ref 0.0–0.2)
Indirect Bilirubin: 0.7 mg/dL (ref 0.3–0.9)
Total Bilirubin: 0.8 mg/dL (ref 0.3–1.2)
Total Protein: 6.6 g/dL (ref 6.5–8.1)

## 2021-07-24 LAB — URINALYSIS, ROUTINE W REFLEX MICROSCOPIC
Bilirubin Urine: NEGATIVE
Glucose, UA: 50 mg/dL — AB
Ketones, ur: 5 mg/dL — AB
Nitrite: NEGATIVE
Protein, ur: >=300 mg/dL — AB
Specific Gravity, Urine: 1.016 (ref 1.005–1.030)
pH: 6 (ref 5.0–8.0)

## 2021-07-24 LAB — RPR: RPR Ser Ql: NONREACTIVE

## 2021-07-24 LAB — CBG MONITORING, ED
Glucose-Capillary: 159 mg/dL — ABNORMAL HIGH (ref 70–99)
Glucose-Capillary: 193 mg/dL — ABNORMAL HIGH (ref 70–99)
Glucose-Capillary: 118 mg/dL — ABNORMAL HIGH (ref 70–99)

## 2021-07-24 LAB — GLUCOSE, CAPILLARY: Glucose-Capillary: 280 mg/dL — ABNORMAL HIGH (ref 70–99)

## 2021-07-24 LAB — C-REACTIVE PROTEIN: CRP: 0.6 mg/dL (ref <1.0)

## 2021-07-24 LAB — FOLATE: Folate: 12.8 ng/mL (ref >5.9)

## 2021-07-24 LAB — SEDIMENTATION RATE: Sed Rate: 64 mm/hr — ABNORMAL HIGH (ref 0–22)

## 2021-07-24 LAB — HIV ANTIBODY (ROUTINE TESTING W REFLEX): HIV Screen 4th Generation wRfx: NONREACTIVE

## 2021-07-24 LAB — CK: Total CK: 1614 U/L — ABNORMAL HIGH (ref 38–234)

## 2021-07-24 LAB — TSH: TSH: 1.509 u[IU]/mL (ref 0.350–4.500)

## 2021-07-24 LAB — VITAMIN B12: Vitamin B-12: 253 pg/mL (ref 180–914)

## 2021-07-24 MED ORDER — HYDRALAZINE HCL 20 MG/ML IJ SOLN: 10.0000 mg | Freq: Four times a day (QID) | INTRAMUSCULAR | Status: DC | PRN

## 2021-07-24 MED ORDER — INSULIN ASPART 100 UNIT/ML IJ SOLN
0.0000 [IU] | Freq: Three times a day (TID) | INTRAMUSCULAR | Status: DC
  Administered 2021-07-24 (×2): 2 [IU] via SUBCUTANEOUS
  Administered 2021-07-24: 3 [IU] via SUBCUTANEOUS
  Administered 2021-07-25 (×3): 1 [IU] via SUBCUTANEOUS
  Administered 2021-07-26: 3 [IU] via SUBCUTANEOUS
  Administered 2021-07-26: 1 [IU] via SUBCUTANEOUS
  Administered 2021-07-27: 2 [IU] via SUBCUTANEOUS
  Administered 2021-07-27: 3 [IU] via SUBCUTANEOUS
  Administered 2021-07-27: 7 [IU] via SUBCUTANEOUS
  Administered 2021-07-27: 5 [IU] via SUBCUTANEOUS
  Administered 2021-07-28: 3 [IU] via SUBCUTANEOUS
  Administered 2021-07-28: 5 [IU] via SUBCUTANEOUS

## 2021-07-24 MED ORDER — OLANZAPINE 2.5 MG PO TABS
2.5000 mg | ORAL_TABLET | Freq: Once | ORAL | Status: AC
  Administered 2021-07-24: 2.5 mg via ORAL
  Filled 2021-07-24: qty 1

## 2021-07-24 MED ORDER — OLANZAPINE 2.5 MG PO TABS
5.0000 mg | ORAL_TABLET | Freq: Every day | ORAL | Status: DC
  Administered 2021-07-24: 5 mg via ORAL
  Filled 2021-07-24: qty 2

## 2021-07-24 MED ORDER — INSULIN GLARGINE-YFGN 100 UNIT/ML ~~LOC~~ SOLN
10.0000 [IU] | Freq: Two times a day (BID) | SUBCUTANEOUS | Status: DC
  Administered 2021-07-24 – 2021-07-25 (×4): 10 [IU] via SUBCUTANEOUS
  Filled 2021-07-24 (×6): qty 0.1

## 2021-07-24 MED ORDER — OLANZAPINE 2.5 MG PO TABS
2.5000 mg | ORAL_TABLET | Freq: Every day | ORAL | Status: DC
  Administered 2021-07-24: 2.5 mg via ORAL
  Filled 2021-07-24: qty 1

## 2021-07-24 MED ORDER — LACTATED RINGERS IV SOLN: INTRAVENOUS | Status: AC

## 2021-07-24 MED ORDER — OLANZAPINE 2.5 MG PO TABS: 5.0000 mg | ORAL_TABLET | Freq: Every day | ORAL | Status: DC

## 2021-07-24 NOTE — ED Notes (Signed)
Patient ambulates to the restroom at this time with 1 assist. Patient repositioned in the bed at this time after eating her dinner by herself. Patient asks this RN to turn on the TV at this time and reports no further complaints

## 2021-07-24 NOTE — Hospital Course (Addendum)
Nichole Munoz was admitted with the working diagnosis of akathisia.  76 yo female with the past medical history of chronic kidney disease, T2DM, GERD, hypertension and GERD who presented with spasms. Patient was noted to have akathisia for 3 days, flailing movements of the right arm and leg. No weakness. Recently started on remeron. On her initial physical examination her blood pressure was 111/95, HR 96, RR 20 and 02 saturation 96%, lungs with no wheezing or rales, heart with S1 and S2 present and rhythmic, abdomen soft and no lower extremity edema. Patient able to follow commands, noted akathisias on the right side.  Na 143, K 4,3 Cl 106 bicarbonate 24 glucose 140 bun 31 and cr 2,68  Wbc 9,4 hgb 11,3 plt 211  Sars covid 19 negative   Urine analysis with SG 1,016, > 300 protein, 6-10 wbc, 6-10 rbc. Cloudy   Head CT negative for acute changes.   Chest radiograph with no cardiomegaly mild left base atelectasis, no infiltrates.   EKG 77 bpm, normal axis, normal intervals, sinus rhythm with one non conducting PAC, no significant ST segment or T wave changes.   Neurology was consulted with recommendations to discontinue remeron and placed on olanzapine.   07/20 patient continue with right upper extremity involuntary movements. Mild improvement but not resolved.
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
## 2021-07-24 NOTE — Progress Notes (Signed)
  Progress Note   Patient: Nichole Munoz YBW:[id number] DOB: [birth year] DOA: 07/23/2021     0 DOS: the patient was seen and examined on 07/24/2021   Brief hospital course: Mrs. Dresch was admitted with the working diagnosis of akathisia.  76 yo female with the past medical history of chronic kidney disease, T2DM, GERD, hypertension and GERD who presented with spasms. Patient was noted to have akathisia for 3 days, flailing movements of the right arm and leg. No weakness. Recently started on remeron. On her initial physical examination her blood pressure was 111/95, HR 96, RR 20 and 02 saturation 96%, lungs with no wheezing or rales, heart with S1 and S2 present and rhythmic, abdomen soft and no lower extremity edema. Patient able to follow commands, noted akathisias on the right side.  Na 143, K 4,3 Cl 106 bicarbonate 24 glucose 140 bun 31 and cr 2,68  Wbc 9,4 hgb 11,3 plt 211  Sars covid 19 negative   Urine analysis with SG 1,016, > 300 protein, 6-10 wbc, 6-10 rbc. Cloudy   Head CT negative for acute changes.   Chest radiograph with no cardiomegaly mild left base atelectasis, no infiltrates.   EKG 77 bpm, normal axis, normal intervals, sinus rhythm with one non conducting PAC, no significant ST segment or T wave changes.   Neurology was consulted with recommendations to discontinue remeron and placed on olanzapine.   Assessment and Plan: * Acute medication-induced akathisia Continue close neuro checks remeron has been discontinued and patient placed on olanzapine Follow up with neurology recommendations.   Acute renal failure superimposed on stage 3a chronic kidney disease ([place]) Patient has been on IV fluids,. Plan to follow up renal function in am, avoid hypotension and nephrotoxic medications.   Type 2 diabetes mellitus with stage 3a chronic kidney disease, with long-term current use of insulin (HCC) Contin with glucose cover and monitoring with insulin sliding scale Continue basal  insulin and holding oral antihyperglycemic agents.    Essential hypertension Continue blood pressure control with amlodipine    Mixed diabetic hyperlipidemia associated with type 2 diabetes mellitus ([place]) Continuing home regimen of lipid lowering therapy.   GERD without esophagitis Continuing home regimen of daily PPI therapy.         Subjective: patient continue to have spontaneous movements on the right side, but able to follow commands   Physical Exam: Vitals:   07/24/21 1609 07/24/21 1610 07/24/21 1613 07/24/21 1630  BP: (!) 161/74   (!) 157/78  Pulse:  79  81  Resp:      Temp:   98 F (36.7 C)   TempSrc:      SpO2:  99%  99%   Neurology awake and alert, continue to have involuntary spontaneous movements on the right side ENT with no pallor Cardiovascular with S1 and S2 present Respiratory with no wheezing No lower extremity edema  Data Reviewed:    Family Communication: no family at the bedside  Disposition: Status is: Observation The patient remains OBS appropriate and will d/c before 2 midnights.  Planned Discharge Destination: Home      Author: Tawni Millers, MD 07/24/2021 6:03 PM  For on call review [email or web address].

## 2021-07-24 NOTE — Assessment & Plan Note (Addendum)
Continue blood pressure control with amlodipine  Systolic blood pressure 350 to 146 mmHg

## 2021-07-24 NOTE — ED Notes (Signed)
[phone] patient's son Pervis Hocking would like to be contacted for any updates
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
## 2021-07-24 NOTE — Assessment & Plan Note (Addendum)
Antiacid therapy with pantoprazole.

## 2021-07-24 NOTE — Progress Notes (Signed)
Neurology Progress Note, same day no charge note  Patient ID: "Nichole Munoz is a 76 y.o. female with PMH significant for DM2, HTN, diabetic neuropathy, hyperlipidemia who presents with 3 days history of Right worse than left akathisia and choreiform movements. Her neurologic examination is notable for flailing movements of RUE and RLE and some involvement of the LUE and LLE too. 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 Seem to have worsened over the last 3 days when they were initially noted to the point that they interfere with her sleep.   The etiology of this is somewhat evasive. Differential includes drug induced possibly from mirtazapine, cerebrovascular from a small stroke in the striatum or STN nucleus, diabetic hemichorea/hemiballismus(HCHB) specially since her DM2 is poorly controlled with significant hyperglycemia, Wilson's, Sydenhams chorea, autoimmune from APLA/SLEbut would expect that to be more of a subacute process, infectious including HIV, Lyme, syphilis. Unlikely to be huntingtons chorea given no family history and course would be atypical.  Subjective: -Feels that her movements are perhaps slightly better than yesterday, certainly not worse -Reports mirtazapine was initiated for poor sleep and also depression -No other complaints  Exam: Current vital signs: BP (!) 159/85 (BP Location: Left Arm)   Pulse 93   Temp 99 F (37.2 C)   Resp 20   SpO2 98%  Vital signs in last 24 hours: Temp:  [97.6 F (36.4 C)-99 F (37.2 C)] 99 F (37.2 C) (07/20 0335) Pulse Rate:  [72-96] 93 (07/20 0335) Resp:  [15-20] 20 (07/20 0335) BP: (107-161)/(59-117) 159/85 (07/20 0335) SpO2:  [97 %-100 %] 98 % (07/20 0335)  Gen: In bed, comfortable  Resp: non-labored breathing, no grossly audible wheezing Cardiac: Perfusing extremities well  Abd: soft, nt  Neuro: MS: Awake, alert, appropriate and oriented to situation  in discussion through interpreter CN: Pupils equal and round, tardive dyskinesia of the right face Motor: Tardive dyskinesia of the right side much greater than left side, stable to slightly improved from yesterday  Pertinent Labs:  Normal / negative: TSH RPR LFTs (mildly low albumin at 3.4)  HIV ESR and CRP,  TPO neg Ceruloplasmin normal 21.7  Abnormal CK 1614 (elevated)    Pending: serum Lyme, Copper with Ceruloplasmin, ASO titer, ANA with IFA, APLA, ACE, TPO. Unresulted Labs (From admission, onward)     Start     Ordered   07/24/21 1150  Lyme Disease Serology w/Reflex  Add-on,   AD        07/24/21 1149   07/24/21 0457  Angiotensin converting enzyme  Once,   R        07/24/21 0456   07/24/21 0456  Antiphospholipid syndrome eval, bld  Once,   R        07/24/21 0456   07/24/21 0456  Anti-DNA antibody, double-stranded  Once,   R        07/24/21 0456   07/24/21 0455  Antistreptolysin O titer  Once,   R        07/24/21 0456   07/24/21 0455  Copper, serum  Once,   R        07/24/21 0456   07/24/21 0455  ANA, IFA (with reflex)  Once,   R        07/24/21 0456           EKG 7/19 reveals normal QTc  Impression: 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  Her slight improvement today may be secondary to  olanzapine versus slow clearance of mirtazapine.  Given mirtazapine half-life is 20 to 40 hours, do expect it would take several days to clear.  Additionally discussed with patient that movements do not always fully resolve after discontinuation of the offending drug.  Recommendations: - CK to monitor for muscle injury from movements - Follow-up serological work-up initiated by Dr. Lorrin Goodell as above - Agree with strict glycemic control - s/p olanzapine 2.5 mg x 2 doses 7/19 AM, 5 mg QHS; today increased to 10 mg nightly - We will continue to monitor QTc as olanzapine is uptitrated (EKG ordered for 10 AM tomorrow)  Lesleigh Noe MD-PhD [facility] [phone]   Greater than 35 minutes were spent in care of this patient today, greater than 50% of bedside, with interpretation provided by video interpreter

## 2021-07-24 NOTE — Assessment & Plan Note (Addendum)
Hypokalemia.   Renal function with serum cr down to 2,41 with K at 3,3 and serum bicarbonate at 24. Ck elevated at 1,614 on admission.   Plan to continue IV fluids, with hypotonic saline to prevent hypernatremia and hyperchloremia Add oral Kcl 40 meq Follow up renal function and electrolytes in am. Continue to trend Ck.

## 2021-07-24 NOTE — Assessment & Plan Note (Addendum)
Remeron has been discontinued and patient placed on olanzapine  Today she continue to have right upper and lower extremity spontaneous non voluntary movements, with only mild improvement.   Plan to continue with olanzapine Follow recommendations from neurology.  Fall precautions.  Follow on CK and add analgesics for generalized pain.  Added multivitamins and thiamine Consult nutrition.

## 2021-07-24 NOTE — Assessment & Plan Note (Deleted)
   On exam patient is exhibiting uncontrollable movements of the right arm and right leg consistent with akathisia's  Son reports the patient was just recently placed on Remeron approximately 2 weeks ago  While this would be a rare side effect after a thorough review this is the most likely cause of the patient's presentation  Remeron has been discontinued  Monitoring patient closely with fall precautions  Hydrating patient with intravenous isotonic fluids  Serial neurologic checks.  CT head unremarkable  MRI brain is of low quality due to movement artifact but reveals no obvious evidence of stroke.  Obtaining EEG, vitamin B12, TSH, inflammatory markers  ER provider discussed case with Dr. Lorrin Goodell with Neurology who is following along in consultation, his input is appreciated.

## 2021-07-24 NOTE — Assessment & Plan Note (Addendum)
Hyperglycemia.  Fasting glucose this am 126, capillary glucose has been 118, 280 and 145.   Continue glucose cover and monitoring with insulin sliding scale Continue basal insulin 10 units bid Holding oral antihyperglycemic agents.   Hold on statin therapy due to elevated Ck.

## 2021-07-24 NOTE — ED Notes (Signed)
Help sit patient up in bed gave patient a diet ginger ale patient is resting with call bell in reach and family at bedside

## 2021-07-24 NOTE — Assessment & Plan Note (Signed)
.   Continuing home regimen of lipid lowering therapy.  

## 2021-07-25 DIAGNOSIS — G255 Other chorea: Secondary | ICD-10-CM | POA: Diagnosis not present

## 2021-07-25 DIAGNOSIS — E440 Moderate protein-calorie malnutrition: Secondary | ICD-10-CM | POA: Diagnosis not present

## 2021-07-25 DIAGNOSIS — E1122 Type 2 diabetes mellitus with diabetic chronic kidney disease: Secondary | ICD-10-CM | POA: Diagnosis present

## 2021-07-25 DIAGNOSIS — E11649 Type 2 diabetes mellitus with hypoglycemia without coma: Secondary | ICD-10-CM | POA: Diagnosis not present

## 2021-07-25 DIAGNOSIS — R531 Weakness: Secondary | ICD-10-CM | POA: Diagnosis not present

## 2021-07-25 DIAGNOSIS — I639 Cerebral infarction, unspecified: Secondary | ICD-10-CM | POA: Diagnosis not present

## 2021-07-25 DIAGNOSIS — Z833 Family history of diabetes mellitus: Secondary | ICD-10-CM | POA: Diagnosis not present

## 2021-07-25 DIAGNOSIS — Z8673 Personal history of transient ischemic attack (TIA), and cerebral infarction without residual deficits: Secondary | ICD-10-CM | POA: Diagnosis not present

## 2021-07-25 DIAGNOSIS — J45909 Unspecified asthma, uncomplicated: Secondary | ICD-10-CM | POA: Diagnosis not present

## 2021-07-25 DIAGNOSIS — Z7982 Long term (current) use of aspirin: Secondary | ICD-10-CM | POA: Diagnosis not present

## 2021-07-25 DIAGNOSIS — M6282 Rhabdomyolysis: Secondary | ICD-10-CM | POA: Diagnosis not present

## 2021-07-25 DIAGNOSIS — G2571 Drug induced akathisia: Secondary | ICD-10-CM | POA: Diagnosis present

## 2021-07-25 DIAGNOSIS — N179 Acute kidney failure, unspecified: Secondary | ICD-10-CM | POA: Diagnosis not present

## 2021-07-25 DIAGNOSIS — I10 Essential (primary) hypertension: Secondary | ICD-10-CM | POA: Diagnosis not present

## 2021-07-25 DIAGNOSIS — E1140 Type 2 diabetes mellitus with diabetic neuropathy, unspecified: Secondary | ICD-10-CM | POA: Diagnosis present

## 2021-07-25 DIAGNOSIS — E1165 Type 2 diabetes mellitus with hyperglycemia: Secondary | ICD-10-CM | POA: Diagnosis present

## 2021-07-25 DIAGNOSIS — K219 Gastro-esophageal reflux disease without esophagitis: Secondary | ICD-10-CM | POA: Diagnosis not present

## 2021-07-25 DIAGNOSIS — Z20822 Contact with and (suspected) exposure to covid-19: Secondary | ICD-10-CM | POA: Diagnosis not present

## 2021-07-25 DIAGNOSIS — E785 Hyperlipidemia, unspecified: Secondary | ICD-10-CM

## 2021-07-25 DIAGNOSIS — Z794 Long term (current) use of insulin: Secondary | ICD-10-CM | POA: Diagnosis not present

## 2021-07-25 DIAGNOSIS — E876 Hypokalemia: Secondary | ICD-10-CM | POA: Diagnosis present

## 2021-07-25 DIAGNOSIS — N1831 Chronic kidney disease, stage 3a: Secondary | ICD-10-CM | POA: Diagnosis not present

## 2021-07-25 DIAGNOSIS — I129 Hypertensive chronic kidney disease with stage 1 through stage 4 chronic kidney disease, or unspecified chronic kidney disease: Secondary | ICD-10-CM | POA: Diagnosis not present

## 2021-07-25 DIAGNOSIS — Z79899 Other long term (current) drug therapy: Secondary | ICD-10-CM | POA: Diagnosis not present

## 2021-07-25 DIAGNOSIS — E869 Volume depletion, unspecified: Secondary | ICD-10-CM | POA: Diagnosis not present

## 2021-07-25 DIAGNOSIS — E1169 Type 2 diabetes mellitus with other specified complication: Secondary | ICD-10-CM | POA: Diagnosis not present

## 2021-07-25 DIAGNOSIS — T43025A Adverse effect of tetracyclic antidepressants, initial encounter: Secondary | ICD-10-CM | POA: Diagnosis not present

## 2021-07-25 DIAGNOSIS — N184 Chronic kidney disease, stage 4 (severe): Secondary | ICD-10-CM | POA: Diagnosis not present

## 2021-07-25 DIAGNOSIS — E782 Mixed hyperlipidemia: Secondary | ICD-10-CM | POA: Diagnosis present

## 2021-07-25 DIAGNOSIS — T50905A Adverse effect of unspecified drugs, medicaments and biological substances, initial encounter: Secondary | ICD-10-CM | POA: Diagnosis not present

## 2021-07-25 LAB — BASIC METABOLIC PANEL
Anion gap: 10 (ref 5–15)
BUN: 29 mg/dL — ABNORMAL HIGH (ref 8–23)
CO2: 24 mmol/L (ref 22–32)
Calcium: 9 mg/dL (ref 8.9–10.3)
Chloride: 110 mmol/L (ref 98–111)
Creatinine, Ser: 2.41 mg/dL — ABNORMAL HIGH (ref 0.44–1.00)
GFR, Estimated: 20 mL/min — ABNORMAL LOW (ref >60)
Glucose, Bld: 126 mg/dL — ABNORMAL HIGH (ref 70–99)
Potassium: 3.3 mmol/L — ABNORMAL LOW (ref 3.5–5.1)
Sodium: 144 mmol/L (ref 135–145)

## 2021-07-25 LAB — CERULOPLASMIN: Ceruloplasmin: 21.7 mg/dL (ref 19.0–39.0)

## 2021-07-25 LAB — GLUCOSE, CAPILLARY
Glucose-Capillary: 145 mg/dL — ABNORMAL HIGH (ref 70–99)
Glucose-Capillary: 150 mg/dL — ABNORMAL HIGH (ref 70–99)
Glucose-Capillary: 150 mg/dL — ABNORMAL HIGH (ref 70–99)
Glucose-Capillary: 142 mg/dL — ABNORMAL HIGH (ref 70–99)

## 2021-07-25 LAB — ANTISTREPTOLYSIN O TITER: ASO: <20 IU/mL (ref 0.0–200.0)

## 2021-07-25 LAB — ANGIOTENSIN CONVERTING ENZYME: Angiotensin-Converting Enzyme: 28 U/L (ref 14–82)

## 2021-07-25 LAB — ANTINUCLEAR ANTIBODIES, IFA: ANA Ab, IFA: NEGATIVE

## 2021-07-25 LAB — ANTI-DNA ANTIBODY, DOUBLE-STRANDED: ds DNA Ab: 12 IU/mL — ABNORMAL HIGH (ref 0–9)

## 2021-07-25 LAB — CK: Total CK: 1059 U/L — ABNORMAL HIGH (ref 38–234)

## 2021-07-25 LAB — THYROID PEROXIDASE ANTIBODY: Thyroperoxidase Ab SerPl-aCnc: 13 IU/mL (ref 0–34)

## 2021-07-25 MED ORDER — PROSIGHT PO TABS
1.0000 | ORAL_TABLET | Freq: Every day | ORAL | Status: DC
  Administered 2021-07-25 – 2021-07-28 (×4): 1 via ORAL
  Filled 2021-07-25 (×4): qty 1

## 2021-07-25 MED ORDER — THIAMINE HCL 100 MG PO TABS
250.0000 mg | ORAL_TABLET | Freq: Two times a day (BID) | ORAL | Status: DC
Start: 2021-07-25 — End: 2021-07-28
  Administered 2021-07-25 – 2021-07-28 (×7): 250 mg via ORAL
  Filled 2021-07-25 (×7): qty 3

## 2021-07-25 MED ORDER — POTASSIUM CHLORIDE CRYS ER 20 MEQ PO TBCR
40.0000 meq | EXTENDED_RELEASE_TABLET | Freq: Once | ORAL | Status: AC
  Administered 2021-07-25: 40 meq via ORAL
  Filled 2021-07-25: qty 2

## 2021-07-25 MED ORDER — OLANZAPINE 2.5 MG PO TABS
10.0000 mg | ORAL_TABLET | Freq: Every day | ORAL | Status: DC
  Administered 2021-07-25 – 2021-07-27 (×3): 10 mg via ORAL
  Filled 2021-07-25 (×3): qty 4

## 2021-07-25 MED ORDER — SODIUM CHLORIDE 0.45 % IV SOLN: INTRAVENOUS | Status: DC

## 2021-07-25 NOTE — Evaluation (Signed)
Physical Therapy Evaluation Patient Details Name: Nichole Munoz MRN: [id number] DOB: 1945/08/23 Today's Date: 07/25/2021  History of Present Illness  pt is a 76 y/o female admitted 7/18 with akathisia (flailing movements) of the right arm and leg.  Work up suggestive of acute medication-induced akathisia.  PMHx:   DM, neuropathy, HTN, arhtritis  Clinical Impression  Pt admitted with/for akathisia.  Hopefully this is medicine-induced and will resolve soon.  Presently, pt needs min to moderate assist for basic mobility and gait.Marland Kitchen  Pt currently limited functionally due to the problems listed below.  (see problems list.)  Pt will benefit from PT to maximize function and safety to be able to get home safely with available assist.        Recommendations for follow up therapy are one component of a multi-disciplinary discharge planning process, led by the attending physician.  Recommendations may be updated based on patient status, additional functional criteria and insurance authorization.  Follow Up Recommendations Home health PT (hopeful that s/s resolve with no PT f/u needs.)      Assistance Recommended at Discharge Intermittent Supervision/Assistance  Patient can return home with the following  A little help with walking and/or transfers;A little help with bathing/dressing/bathroom;Assistance with cooking/housework;Assist for transportation;Help with stairs or ramp for entrance    Equipment Recommendations Other (comment) (TBD based on progress.)  Recommendations for Other Services       Functional Status Assessment Patient has had a recent decline in their functional status and demonstrates the ability to make significant improvements in function in a reasonable and predictable amount of time.     Precautions / Restrictions Precautions Precautions: Fall      Mobility  Bed Mobility Overal bed mobility: Needs Assistance Bed Mobility: Supine to Sit, Sit to Supine     Supine to sit: Min  assist Sit to supine: Min guard   General bed mobility comments: follows commands well,  minimally assisted for safety due to arm and leg flailing around hard stationary surfaces.    Transfers Overall transfer level: Needs assistance   Transfers: Sit to/from Stand Sit to Stand: Min assist           General transfer comment: min stability for safety due to incoordinated movements    Ambulation/Gait Ambulation/Gait assistance: Min assist, Mod assist Gait Distance (Feet): 45 Feet (limited by pt's concern of safety and falling.) Assistive device: 1 person hand held assist Gait Pattern/deviations: Step-through pattern, Ataxic   Gait velocity interpretation: <1.31 ft/sec, indicative of household ambulator   General Gait Details: unsteady, ataxic gait with uncoordinated steps mainly with R LE, worsened by truncal/R UE incoordinatation  Stairs            Wheelchair Mobility    Modified Rankin (Stroke Patients Only)       Balance Overall balance assessment: Needs assistance Sitting-balance support: No upper extremity supported, Feet supported Sitting balance-Leahy Scale: Fair     Standing balance support: Single extremity supported, During functional activity Standing balance-Leahy Scale: Poor Standing balance comment: reliant on external assist                             Pertinent Vitals/Pain Pain Assessment Pain Assessment: Faces Faces Pain Scale: Hurts little more Pain Location: body pain from writhing movements Pain Intervention(s): Monitored during session, Patient requesting pain meds-RN notified    Home Living Family/patient expects to be discharged to:: Private residence Living Arrangements: Children;Other relatives (lots of family,  someone always around with pt.) Available Help at Discharge: Family;Available 24 hours/day (but lots of family work days.) Type of Home: House           Home Equipment: None      Prior Function Prior  Level of Function : Independent/Modified Independent                     Hand Dominance        Extremity/Trunk Assessment   Upper Extremity Assessment Upper Extremity Assessment: RUE deficits/detail;LUE deficits/detail RUE Deficits / Details: functional strength, uncontrolled writhing movements, incoordination significant LUE Deficits / Details: L side significantly less involved than right side,  WFL    Lower Extremity Assessment Lower Extremity Assessment: RLE deficits/detail;LLE deficits/detail RLE Deficits / Details: functional strength, incoordination--writhing movements.  ataxic gait. LLE Deficits / Details: WFL--significantly less involved if at all.    Cervical / Trunk Assessment Cervical / Trunk Assessment: Normal  Communication   Communication: Prefers language other than English Market researcher  Dany  [phone])  Cognition Arousal/Alertness: Awake/alert Behavior During Therapy: WFL for tasks assessed/performed Overall Cognitive Status: Within Functional Limits for tasks assessed                                          General Comments General comments (skin integrity, edema, etc.): vss    Exercises     Assessment/Plan    PT Assessment Patient needs continued PT services  PT Problem List Decreased balance;Decreased mobility;Decreased coordination;Pain;Decreased strength       PT Treatment Interventions Gait training;Stair training;Functional mobility training;Therapeutic activities;Balance training;Patient/family education    PT Goals (Current goals can be found in the Care Plan section)  Acute Rehab PT Goals PT Goal Formulation: Patient unable to participate in goal setting Time For Goal Achievement: 08/08/21 Potential to Achieve Goals: Good    Frequency Min 3X/week     Co-evaluation               AM-PAC PT "6 Clicks" Mobility  Outcome Measure Help needed turning from your back to your side while in a flat bed without  using bedrails?: A Little Help needed moving from lying on your back to sitting on the side of a flat bed without using bedrails?: A Little Help needed moving to and from a bed to a chair (including a wheelchair)?: A Little Help needed standing up from a chair using your arms (e.g., wheelchair or bedside chair)?: A Little Help needed to walk in hospital room?: A Lot Help needed climbing 3-5 steps with a railing? : A Lot 6 Click Score: 16    End of Session   Activity Tolerance: Patient tolerated treatment well Patient left: in chair;with call bell/phone within reach;with chair alarm set;with nursing/sitter in room Nurse Communication: Mobility status PT Visit Diagnosis: Other abnormalities of gait and mobility (R26.89);Difficulty in walking, not elsewhere classified (R26.2);Other symptoms and signs involving the nervous system (R29.898)    Time: 3016-0109 PT Time Calculation (min) (ACUTE ONLY): 45 min   Charges:   PT Evaluation $PT Eval Moderate Complexity: 1 Mod PT Treatments $Gait Training: 8-22 mins $Therapeutic Activity: 8-22 mins        07/25/2021  Ginger Carne., PT Acute Rehabilitation Services [phone]  (pager) [phone]  (office)  Tessie Fass Syleena Mchan 07/25/2021, 1:12 PM

## 2021-07-25 NOTE — TOC Initial Note (Signed)
Transition of Care [facility]) - Initial/Assessment Note    Patient Details  Name: Nichole Munoz MRN: [id number] Date of Birth: 10/05/1945  [facility] CM/SW Contact:    Ninfa Meeker, RN Phone Number: 07/25/2021, 10:11 AM  Clinical Narrative:                  Transition of Care Screening Note:  Transition of Care Department [facility]) has reviewed patient and no TOC needs have been identified at this time. We will continue to monitor patient advancement through Interdisciplinary progressions. If new patient transition needs arise, please place a consult.         Patient Goals and CMS Choice        Expected Discharge Plan and Services                                                Prior Living Arrangements/Services                       Activities of Daily Living      Permission Sought/Granted                  Emotional Assessment              Admission diagnosis:  Involuntary movements [R25.9] Stroke-like symptoms [R29.90] Abnormal involuntary movements [R25.9] Patient Active Problem List   Diagnosis Date Noted   Mixed diabetic hyperlipidemia associated with type 2 diabetes mellitus ([place]) 07/24/2021   GERD without esophagitis 07/24/2021   Acute medication-induced akathisia 07/23/2021   Essential hypertension 01/24/2017   Asthma due to seasonal allergies 33/00/7622   Acute metabolic encephalopathy 63/33/5456   Acute renal failure superimposed on stage 3a chronic kidney disease ([place]) 04/26/2016   Stage 3a chronic kidney disease (CKD) (HCC) - baseline SCr 1.4 09/23/2014   Type 2 diabetes mellitus with stage 3a chronic kidney disease, with long-term current use of insulin ([place]) 09/23/2014   Diabetic neuropathy ([place]) 04/12/2013   PCP:  [facility], [facility] Pharmacy:   [id number] - [place], [place]  [address] Phone: [phone] Fax: [phone]     Social Determinants of Health (SDOH) Interventions    Readmission Risk Interventions     No data to display

## 2021-07-25 NOTE — Progress Notes (Signed)
Progress Note   Patient: Nichole Munoz CHE:[id number] DOB: 1945/06/06 DOA: 07/23/2021     0 DOS: the patient was seen and examined on 07/25/2021   Brief hospital course: Nichole Munoz was admitted with the working diagnosis of akathisia.  76 yo female with the past medical history of chronic kidney disease, T2DM, GERD, hypertension and GERD who presented with spasms. Patient was noted to have akathisia for 3 days, flailing movements of the right arm and leg. No weakness. Recently started on remeron. On her initial physical examination her blood pressure was 111/95, HR 96, RR 20 and 02 saturation 96%, lungs with no wheezing or rales, heart with S1 and S2 present and rhythmic, abdomen soft and no lower extremity edema. Patient able to follow commands, noted akathisias on the right side.  Na 143, K 4,3 Cl 106 bicarbonate 24 glucose 140 bun 31 and cr 2,68  Wbc 9,4 hgb 11,3 plt 211  Sars covid 19 negative   Urine analysis with SG 1,016, > 300 protein, 6-10 wbc, 6-10 rbc. Cloudy   Head CT negative for acute changes.   Chest radiograph with no cardiomegaly mild left base atelectasis, no infiltrates.   EKG 77 bpm, normal axis, normal intervals, sinus rhythm with one non conducting PAC, no significant ST segment or T wave changes.   Neurology was consulted with recommendations to discontinue remeron and placed on olanzapine.   07/20 patient continue with right upper extremity involuntary movements. Mild improvement but not resolved.   Assessment and Plan: * Acute medication-induced akathisia Remeron has been discontinued and patient placed on olanzapine  Today she continue to have right upper and lower extremity spontaneous non voluntary movements, with only mild improvement.   Plan to continue with olanzapine Follow recommendations from neurology.  Fall precautions.  Follow on CK and add analgesics for generalized pain.  Added multivitamins and thiamine Consult nutrition.   Acute renal failure  superimposed on stage 3a chronic kidney disease (HCC) Hypokalemia.   Renal function with serum cr down to 2,41 with K at 3,3 and serum bicarbonate at 24. Ck elevated at 1,614 on admission.   Plan to continue IV fluids, with hypotonic saline to prevent hypernatremia and hyperchloremia Add oral Kcl 40 meq Follow up renal function and electrolytes in am. Continue to trend Ck.  Type 2 diabetes mellitus with hyperlipidemia (HCC) Hyperglycemia.  Fasting glucose this am 126, capillary glucose has been 118, 280 and 145.   Continue glucose cover and monitoring with insulin sliding scale Continue basal insulin 10 units bid Holding oral antihyperglycemic agents.   Hold on statin therapy due to elevated Ck.   Essential hypertension Continue blood pressure control with amlodipine  Systolic blood pressure 536 to 146 mmHg    GERD without esophagitis Antiacid therapy with pantoprazole.         Subjective: Patient with no chest pain or dyspnea, positive generalized pain, she continue to have right upper and lower extremity involuntary spontaneous movements, more upper than lower. Only with mild improvement.  Video interpreter at the bedside   Physical Exam: Vitals:   07/24/21 1945 07/24/21 2344 07/25/21 0335 07/25/21 0741  BP: (!) 144/117 (!) 136/106 (!) 159/85 (!) 146/70  Pulse: 96 86 93 84  Resp:  16 20 19   Temp: 97.9 F (36.6 C) 97.6 F (36.4 C) 99 F (37.2 C) 98.1 F (36.7 C)  TempSrc: Oral   Oral  SpO2: 98% 100% 98% 100%   Neurology awake and alert, follows commands and strength is preserved  bilaterally. Noted persistent spontaneous movements in her right upper and lower extremities that appear non voluntary.  ENT with mild pallor Cardiovascular with S1 and S2 present and rhythmic Respiratory with no wheezing Abdomen not distended No lower extremity edema  Data Reviewed:    Family Communication: I spoke over the phone with the patient's daughter about patient's   condition, plan of care, prognosis and all questions were addressed.   Disposition: Status is: Observation The patient will require care spanning > 2 midnights and should be moved to inpatient because: IV fluids and neurologic monitoring   Planned Discharge Destination: Home     Author: Tawni Millers, MD 07/25/2021 11:37 AM  For on call review [email or web address].

## 2021-07-26 DIAGNOSIS — T50905A Adverse effect of unspecified drugs, medicaments and biological substances, initial encounter: Secondary | ICD-10-CM | POA: Diagnosis not present

## 2021-07-26 DIAGNOSIS — G2571 Drug induced akathisia: Secondary | ICD-10-CM | POA: Diagnosis not present

## 2021-07-26 LAB — BASIC METABOLIC PANEL
Anion gap: 8 (ref 5–15)
BUN: 33 mg/dL — ABNORMAL HIGH (ref 8–23)
CO2: 24 mmol/L (ref 22–32)
Calcium: 8.7 mg/dL — ABNORMAL LOW (ref 8.9–10.3)
Chloride: 112 mmol/L — ABNORMAL HIGH (ref 98–111)
Creatinine, Ser: 2.5 mg/dL — ABNORMAL HIGH (ref 0.44–1.00)
GFR, Estimated: 19 mL/min — ABNORMAL LOW (ref >60)
Glucose, Bld: 67 mg/dL — ABNORMAL LOW (ref 70–99)
Potassium: 3.4 mmol/L — ABNORMAL LOW (ref 3.5–5.1)
Sodium: 144 mmol/L (ref 135–145)

## 2021-07-26 LAB — GLUCOSE, CAPILLARY
Glucose-Capillary: 69 mg/dL — ABNORMAL LOW (ref 70–99)
Glucose-Capillary: 82 mg/dL (ref 70–99)
Glucose-Capillary: 131 mg/dL — ABNORMAL HIGH (ref 70–99)
Glucose-Capillary: 116 mg/dL — ABNORMAL HIGH (ref 70–99)
Glucose-Capillary: 247 mg/dL — ABNORMAL HIGH (ref 70–99)

## 2021-07-26 LAB — CK: Total CK: 523 U/L — ABNORMAL HIGH (ref 38–234)

## 2021-07-26 LAB — LYME DISEASE SEROLOGY W/REFLEX: Lyme Total Antibody EIA: NEGATIVE

## 2021-07-26 LAB — ANTIPHOSPHOLIPID SYNDROME EVAL, BLD
Anticardiolipin IgA: <9 APL U/mL (ref 0–11)
Anticardiolipin IgG: <9 GPL U/mL (ref 0–14)
Anticardiolipin IgM: <9 MPL U/mL (ref 0–12)
DRVVT: 37.8 s (ref 0.0–47.0)
PTT Lupus Anticoagulant: 33.3 s (ref 0.0–43.5)
Phosphatydalserine, IgA: 1 APS Units (ref 0–19)
Phosphatydalserine, IgG: 9 Units (ref 0–30)
Phosphatydalserine, IgM: 16 Units (ref 0–30)

## 2021-07-26 MED ORDER — POTASSIUM CHLORIDE CRYS ER 20 MEQ PO TBCR
40.0000 meq | EXTENDED_RELEASE_TABLET | Freq: Once | ORAL | Status: AC
  Administered 2021-07-26: 40 meq via ORAL
  Filled 2021-07-26: qty 2

## 2021-07-26 MED ORDER — SODIUM CHLORIDE 0.9 % IV SOLN: INTRAVENOUS | Status: DC

## 2021-07-26 MED ORDER — ENSURE ENLIVE PO LIQD
237.0000 mL | Freq: Two times a day (BID) | ORAL | Status: DC
  Administered 2021-07-26 – 2021-07-28 (×4): 237 mL via ORAL

## 2021-07-26 NOTE — Progress Notes (Signed)
Physical Therapy Treatment Patient Details Name: Nichole Munoz MRN: [id number] DOB: [birth year] Today's Date: 07/26/2021   History of Present Illness pt is a 76 y/o female admitted 7/18 with akathisia (flailing movements) of the right arm and leg.  Work up suggestive of acute medication-induced akathisia.  PMHx:   DM, neuropathy, HTN, arhtritis    PT Comments    Patient has a hard time following instructions from the interpreter due to being hard of hearing. She continues to have writhing movement of her right side and needs assistance to stand. Limited standing tolerance this session with external support required to maintain standing balance. She feels unable to ambulate today due to LE weakness with standing. PT will continue to follow to maximize independence and decrease caregiver burden.    Recommendations for follow up therapy are one component of a multi-disciplinary discharge planning process, led by the attending physician.  Recommendations may be updated based on patient status, additional functional criteria and insurance authorization.  Follow Up Recommendations  Home health PT (with family support)     Assistance Recommended at Discharge Intermittent Supervision/Assistance  Patient can return home with the following A little help with walking and/or transfers;A little help with bathing/dressing/bathroom;Assistance with cooking/housework;Assist for transportation;Help with stairs or ramp for entrance   Equipment Recommendations  Other (comment) (ongoing assessment)    Recommendations for Other Services       Precautions / Restrictions Precautions Precautions: Fall Restrictions Weight Bearing Restrictions: No     Mobility  Bed Mobility Overal bed mobility: Needs Assistance Bed Mobility: Sit to Supine, Supine to Sit     Supine to sit: Min assist Sit to supine: Min assist   General bed mobility comments: assistance for trunk support. cues for technique     Transfers Overall transfer level: Needs assistance   Transfers: Sit to/from Stand Sit to Stand: Min assist           General transfer comment: assistance for lifting and lowering provided. cues for technique    Ambulation/Gait             Pre-gait activities: standing tolerance of less than 30 seconds. right knee buckling x 1 bout. emphasis on maintaining midline and writhing movement are still present with standing General Gait Details: patient declined attempting to walk. she states "I'm too weak to walk, my right leg is weak."   Chief Strategy Officer    Modified Rankin (Stroke Patients Only)       Balance Overall balance assessment: Needs assistance Sitting-balance support: No upper extremity supported, Feet supported Sitting balance-Leahy Scale: Fair     Standing balance support: Single extremity supported, During functional activity Standing balance-Leahy Scale: Poor Standing balance comment: extrenal support provided                            Cognition Arousal/Alertness: Awake/alert Behavior During Therapy: WFL for tasks assessed/performed Overall Cognitive Status: Within Functional Limits for tasks assessed                                 General Comments: patient has difficulty following commands with the I pad interpreter due to hard of hearing. no family in the room. multi-modal cueing required. interpreter Marcia Brash [phone] used throughout session        Exercises      General  Comments        Pertinent Vitals/Pain Pain Assessment Pain Assessment: No/denies pain    Home Living                          Prior Function            PT Goals (current goals can now be found in the care plan section) Acute Rehab PT Goals Patient Stated Goal: none stated PT Goal Formulation: Patient unable to participate in goal setting Time For Goal Achievement: 08/08/21 Potential to Achieve Goals:  Fair Progress towards PT goals: Progressing toward goals    Frequency    Min 3X/week      PT Plan Current plan remains appropriate    Co-evaluation              AM-PAC PT "6 Clicks" Mobility   Outcome Measure  Help needed turning from your back to your side while in a flat bed without using bedrails?: A Little Help needed moving from lying on your back to sitting on the side of a flat bed without using bedrails?: A Little Help needed moving to and from a bed to a chair (including a wheelchair)?: A Little Help needed standing up from a chair using your arms (e.g., wheelchair or bedside chair)?: A Little Help needed to walk in hospital room?: A Lot Help needed climbing 3-5 steps with a railing? : A Lot 6 Click Score: 16    End of Session Equipment Utilized During Treatment: Gait belt Activity Tolerance: Patient tolerated treatment well Patient left: in bed;with call bell/phone within reach;with bed alarm set Nurse Communication: Mobility status (patient requesting food- alerted the nurse) PT Visit Diagnosis: Other abnormalities of gait and mobility (R26.89);Difficulty in walking, not elsewhere classified (R26.2);Other symptoms and signs involving the nervous system (R29.898)     Time: 3159-4585 PT Time Calculation (min) (ACUTE ONLY): 13 min  Charges:  $Therapeutic Activity: 8-22 mins                     Minna Merritts, PT, MPT    Percell Locus 07/26/2021, 1:58 PM

## 2021-07-26 NOTE — Progress Notes (Signed)
Initial Nutrition Assessment  DOCUMENTATION CODES:   Non-severe (moderate) malnutrition in context of chronic illness  INTERVENTION:  Liberalize diet to regular due to advanced age, malnutrition, and hypoglycemia episodes, encourage PO intake Ensure Enlive po BID, each supplement provides 350 kcal and 20 grams of protein. MVI with minerals daily  NUTRITION DIAGNOSIS:   Severe Malnutrition (in the context of chronic illness) related to  (indequate energy intake) as evidenced by severe muscle depletion, severe fat depletion.  GOAL:   Patient will meet greater than or equal to 90% of their needs  MONITOR:   PO intake, Supplement acceptance, Labs, Weight trends  REASON FOR ASSESSMENT:   Consult Assessment of nutrition requirement/status  ASSESSMENT:  Pt with PMH significant for DM2, HTN, hyperlipidemia, and hx CVA presented to ED with 3 days history of akathisia and choreiform movements.  Pt in bed at time of assessment. Interview was difficult as no interpreter was available virtually. Per NT pt does speak some English, but pt was distracted by needing to urinate. Recorded intake this admission is excellent, pt has been able to communicate to nursing staff what she would like to eat. Has ordered a lot of fish and does not like pot roast.  Significant muscle and fat deficits present on exam indicative of long term poor nutrition. Pt also has several teeth missing, denies trouble eating current diet.  No weight taken this admission, will request  Average Meal Intake: 7/21: 100% intake x 2 recorded meals  Nutritionally Relevant Medications: Scheduled Meds:  Ensure enlive  237 mL Oral BID BM   insulin aspart  0-9 Units Subcutaneous TID AC & HS   multivitamin  1 tablet Oral Daily   thiamine  250 mg Oral BID   Continuous Infusions:  sodium chloride 75 mL/hr at 07/26/21 1509   Labs Reviewed: K 3.4 BUN 33, creatinine 2.5 CBG ranges from 69-150 mg/dL over the last 24  hours HgbA1c 12.2% (5/29)  NUTRITION - FOCUSED PHYSICAL EXAM: Flowsheet Row Most Recent Value  Orbital Region Severe depletion  Upper Arm Region Moderate depletion  Thoracic and Lumbar Region Moderate depletion  Buccal Region Severe depletion  Temple Region Severe depletion  Clavicle Bone Region Moderate depletion  Clavicle and Acromion Bone Region Moderate depletion  Scapular Bone Region Mild depletion  Dorsal Hand Moderate depletion  Patellar Region Severe depletion  Anterior Thigh Region Severe depletion  Posterior Calf Region Severe depletion  Edema (RD Assessment) None  Hair Reviewed  Eyes Reviewed  Mouth Reviewed  [limited dentition]  Skin Reviewed  Nails Reviewed    Diet Order:   Diet Order             Diet regular Room service appropriate? Yes; Fluid consistency: Thin  Diet effective now                   EDUCATION NEEDS:  No education needs have been identified at this time  Skin:  Skin Assessment: Reviewed RN Assessment  Last BM:  unsure  Height:  Ht Readings from Last 1 Encounters:  07/26/21 5' (1.524 m)    Weight:  Wt Readings from Last 1 Encounters:  06/03/21 57.3 kg    Ideal Body Weight:  45.5 kg  BMI:  Body mass index is 24.67 kg/m.  Estimated Nutritional Needs:  Kcal:  1300-1500 kcal/d Protein:  65-80g/d Fluid:  >1.5L/d    Ranell Patrick, RD, LDN Clinical Dietitian RD pager # available in [facility]  After hours/weekend pager # available in [facility], [place]

## 2021-07-26 NOTE — Progress Notes (Signed)
Neurology Progress Note, same day no charge note  Patient ID: "Nichole Munoz is a 76 y.o. female with PMH significant for DM2, HTN, diabetic neuropathy, hyperlipidemia who presents with 3 days history of Right worse than left akathisia and choreiform movements. Her neurologic examination is notable for flailing movements of RUE and RLE and some involvement of the LUE and LLE too. 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 Seem to have worsened over the last 3 days when they were initially noted to the point that they interfere with her sleep.   The etiology of this is somewhat evasive. Differential includes drug induced possibly from mirtazapine, cerebrovascular from a small stroke in the striatum or STN nucleus, diabetic hemichorea/hemiballismus(HCHB) specially since her DM2 is poorly controlled with significant hyperglycemia, Wilson's, Sydenhams chorea, autoimmune from APLA/SLEbut would expect that to be more of a subacute process, infectious including HIV, Lyme, syphilis. Unlikely to be huntingtons chorea given no family history and course would be atypical.  Subjective: -Feels that the abnormal movements have improved since yesterday -C/o urinary frequency and inability to get to the bathroom due to abnormal movements - s/p olanzapine 2.5 mg x 2 doses 7/19 AM, 5 mg QHS; 7/20 increased to 10 mg nightly;   Exam: Current vital signs: BP 134/85 (BP Location: Right Arm)   Pulse 76   Temp 98.7 F (37.1 C) (Oral)   Resp 18   SpO2 93%  Vital signs in last 24 hours: Temp:  [97.5 F (36.4 C)-98.7 F (37.1 C)] 98.7 F (37.1 C) (07/21 0754) Pulse Rate:  [76-93] 76 (07/21 0754) Resp:  [16-20] 18 (07/21 0754) BP: (113-134)/(56-90) 134/85 (07/21 0754) SpO2:  [58 %-98 %] 93 % (07/21 0754)  Gen: In bed, comfortable  Resp: non-labored breathing, no grossly audible wheezing Cardiac: Perfusing extremities well  Abd: soft,  nt  Neuro: MS: Awake, alert, appropriate and oriented to situation in discussion through interpreter CN: Pupils equal and round, tardive dyskinesia of the right face Motor: Tardive dyskinesia of the right side greater than left side, improved from yesterday per patient  On attending examination, movements are noted to be absent during sleep, but recurrent when patient was awoken Gait was cautious with some impairment from her hemichorea but she was able to ambulate with assistance   Pertinent Labs:  Normal / negative: TSH RPR LFTs (mildly low albumin at 3.4)  HIV ESR and CRP,  TPO neg Ceruloplasmin normal 21.7 ASO (-) ACE (-)   Abnormal CK 1614 (elevated)-> 523 dsDNA(+) 12 (neg < 5, equivocal 5 - 9, positive > 9)   Pending: serum Lyme, Copper, APLA, Unresulted Labs (From admission, onward)     Start     Ordered   07/24/21 1150  Lyme Disease Serology w/Reflex  Add-on,   AD        07/24/21 1149   07/24/21 0456  Antiphospholipid syndrome eval, bld  Once,   R        07/24/21 0456   07/24/21 0455  Copper, serum  Once,   R        07/24/21 0456           EKG 7/19 reveals normal Qtc and 7/21 (436 Qtc)   Impression: 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  Her slight improvement today may be secondary to olanzapine versus slow clearance of mirtazapine.  Given mirtazapine half-life is 20 to 40 hours, do expect it would take several days to clear.  Patient states that movements continue to improve, discussed that they will hopefully improve more over the next few days.  Additionally discussed with patient that movements do not always fully resolve after discontinuation of the offending drug.  She does have a positive dsDNA at 12 but this is positive and up to 10% of the population, so unclear significance at this time.  Rheumatological follow-up may be appropriate.  As her symptoms are improving without immune system targeted treatment, I think an  autoimmune etiology is less likely  Recommendations: - Okay to stop trending CK given has been improving - Follow-up serological work-up initiated by Dr. Lorrin Goodell as above - Agree with strict glycemic control - continue olanzapine 10 nightly, this may be tapered on an outpatient basis - No further neurological work-up, neurology is available as needed and should be contacted if patient is worsening - Ambulatory referral to [facility] neurology placed  Patient seen by NP and then by MD  Cortney E Carron Curie , MSN, AGACNP-BC [facility] See Amion for schedule and pager information 07/26/2021 9:58 AM  Attending Neurologist's note:  I personally saw this patient, gathering history, performing a full neurologic examination, reviewing relevant labs, and formulated the assessment and plan, adding the note above for completeness and clarity to accurately reflect my thoughts

## 2021-07-26 NOTE — Progress Notes (Signed)
Hypoglycemic Event  CBG: 69  Treatment: 4 oz juice/soda  Symptoms: None  Follow-up CBG: Time:0620 CBG Result:82  Possible Reasons for Event: Unknown  Comments/MD notified:Hypoglycemic protocol interventions successful MD not notified at this time.    Felecia Jan

## 2021-07-26 NOTE — Progress Notes (Signed)
PROGRESS NOTE  Nichole Munoz  DOB: 1945/02/24  PCP: [facility] VOH:[id number]  DOA: 07/23/2021  LOS: 1 day  Hospital Day: 4  Brief narrative: Nichole Munoz is a 76 y.o. female with PMH significant for DM2, HTN, CKD, GERD, diabetic neuropathy Patient presented to the ED on 7/18 with complaint of akathisia and choreiform movements.  She was apparently recently started on Remeron. Initial work-up with unremarkable blood work, CT head negative for acute changes Admitted to hospital service Neurology consultation was obtained.  Subjective: Patient was seen and examined this afternoon.  Elderly female.  Sitting up at the edge of the bed.  Not in distress.  Working with physical therapy.  Hard of hearing.  Difficult to interpret even with interpreter service.  Family not at bedside. Chart reviewed Hemodynamically stable. Last set of labs from this morning with creatinine worsening at 2.5  Assessment and plan: Acute medication-induced akathisia -Apparently was started on Remeron recently for insomnia.   -Presented with 3 days history of akathisia and choreiform movements. -Neurology consultation was obtained.  Per neurology note, her neurologic examination is notable for flailing movements of RUE and RLE and some involvement of the LUE and LLE too. 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  -Remeron was switched to olanzapine. -Seems to be gradually improving.  Continue to monitor.  AKI on CKD 4 -Creatinine less than 2 at baseline.  Presented with creatinine elevated 2.68.  Only gradually improving. -Continue normal saline at 75 mill per hour -Statin on hold -Continue to monitor Recent Labs    02/25/21 0911 06/03/21 1521 06/03/21 2108 06/04/21 0203 06/04/21 0730 06/04/21 1125 06/11/21 2118 07/23/21 1923 07/25/21 0330 07/26/21 0537  BUN 26* 44* 43* 37* 34* 32* 23 31* 29* 33*   CREATININE 1.50* 3.05* 2.59* 2.26* 2.04* 1.90* 1.53* 2.68* 2.41* 2.50*   Rhabdomyolysis -Elevated CK level likely due to movement disorder -Gradually improving CK level.  Continue to monitor on IV fluid Recent Labs  Lab 07/24/21 0649 07/25/21 0500 07/26/21 0537  CKTOTAL 1,614* 1,059* 523*     Hypokalemia -Potassium low at 3.4 today.  Oral replacement given.  Continue to monitor Recent Labs  Lab 07/23/21 1923 07/25/21 0330 07/26/21 0537  K 4.3 3.3* 3.4*   Uncontrolled type 2 diabetes mellitus Hypoglycemia -A1c elevated at 12.2 on 06/03/2021 -PTA on Lantus 20 units twice daily, metformin 5 mg twice daily, -Currently on Semglee 10 units twice daily -Blood sugar level this morning was low at 69.  I stopped morning dose of Semglee -Neurontin is currently on hold. Recent Labs  Lab 07/25/21 2011 07/26/21 0600 07/26/21 0620 07/26/21 1128 07/26/21 1559  GLUCAP 142* 69* 82 131* 116*    Essential hypertension -Continue blood pressure control with amlodipine.  Lisinopril on hold   GERD without esophagitis -Continue PPI    Goals of care   Code Status: Prior    Mobility: PT eval ongoing  Skin assessment:     Nutritional status:  There is no height or weight on file to calculate BMI.          Diet:  Diet Order             Diet regular Room service appropriate? Yes; Fluid consistency: Thin  Diet effective now                   DVT prophylaxis:  SCDs Start: 07/26/21 0749   Antimicrobials: None Fluid: NS at  66 mill per hour Consultants: Neurology Family Communication: None at bedside  Status is: Inpatient  Continue in-hospital care because: Ongoing work-up for movement disorder Level of care: Med-Surg   Dispo: The patient is from: Home              Anticipated d/c is to: Pending PT eval              Patient currently is not medically stable to d/c.   Difficult to place patient No     Infusions:   sodium chloride 75 mL/hr at 07/26/21 1509     Scheduled Meds:  feeding supplement  237 mL Oral BID BM   insulin aspart  0-9 Units Subcutaneous TID AC & HS   multivitamin  1 tablet Oral Daily   OLANZapine  10 mg Oral QHS   thiamine  250 mg Oral BID    PRN meds: hydrALAZINE   Antimicrobials: Anti-infectives (From admission, onward)    None       Objective: Vitals:   07/26/21 1526 07/26/21 1527  BP: (!) 87/57 125/66  Pulse: 81 82  Resp:    Temp: 98.4 F (36.9 C)   SpO2: 100%     Intake/Output Summary (Last 24 hours) at 07/26/2021 1608 Last data filed at 07/26/2021 1509 Gross per 24 hour  Intake 2254.08 ml  Output 600 ml  Net 1654.08 ml   There were no vitals filed for this visit. Weight change:  There is no height or weight on file to calculate BMI.   Physical Exam: General exam: Pleasant, elderly female.  Not in physical distress Skin: No rashes, lesions or ulcers. HEENT: Atraumatic, normocephalic, no obvious bleeding Lungs: Clear to auscultation bilaterally CVS: Regular rate and rhythm, normal GI/Abd soft, nontender, nondistended, bowel sound present CNS: Alert, awake, needs interpreter service.  Involuntary movement disorders present Psychiatry: Mood appropriate Extremities: No pedal edema, no calf tenderness  Data Review: I have personally reviewed the laboratory data and studies available.  F/u labs ordered Unresulted Labs (From admission, onward)     Start     Ordered   07/27/21 5784  Basic metabolic panel  Tomorrow morning,   R       Question:  Specimen collection method  Answer:  Lab=Lab collect   07/26/21 1117   07/27/21 0500  CBC with Differential/Platelet  Tomorrow morning,   R       Question:  Specimen collection method  Answer:  Lab=Lab collect   07/26/21 1117   07/27/21 0500  CK  Tomorrow morning,   R       Question:  Specimen collection method  Answer:  Lab=Lab collect   07/26/21 1117   07/24/21 0456  Antiphospholipid syndrome eval, bld  Once,   R        07/24/21 0456    07/24/21 0455  Copper, serum  Once,   R        07/24/21 0456            Signed, Terrilee Croak, MD [facility] 07/26/2021

## 2021-07-27 DIAGNOSIS — T50905A Adverse effect of unspecified drugs, medicaments and biological substances, initial encounter: Secondary | ICD-10-CM | POA: Diagnosis not present

## 2021-07-27 DIAGNOSIS — E440 Moderate protein-calorie malnutrition: Secondary | ICD-10-CM | POA: Insufficient documentation

## 2021-07-27 LAB — CBC WITH DIFFERENTIAL/PLATELET
Abs Immature Granulocytes: 0.02 10*3/uL (ref 0.00–0.07)
Basophils Absolute: 0 10*3/uL (ref 0.0–0.1)
Basophils Relative: 0 %
Eosinophils Absolute: 0.4 10*3/uL (ref 0.0–0.5)
Eosinophils Relative: 5 %
HCT: 28.7 % — ABNORMAL LOW (ref 36.0–46.0)
Hemoglobin: 9.9 g/dL — ABNORMAL LOW (ref 12.0–15.0)
Immature Granulocytes: 0 %
Lymphocytes Relative: 33 %
Lymphs Abs: 2.6 10*3/uL (ref 0.7–4.0)
MCH: 29.6 pg (ref 26.0–34.0)
MCHC: 34.5 g/dL (ref 30.0–36.0)
MCV: 85.7 fL (ref 80.0–100.0)
Monocytes Absolute: 0.6 10*3/uL (ref 0.1–1.0)
Monocytes Relative: 8 %
Neutro Abs: 4.3 10*3/uL (ref 1.7–7.7)
Neutrophils Relative %: 54 %
Platelets: 165 10*3/uL (ref 150–400)
RBC: 3.35 MIL/uL — ABNORMAL LOW (ref 3.87–5.11)
RDW: 12.1 % (ref 11.5–15.5)
WBC: 8 10*3/uL (ref 4.0–10.5)
nRBC: 0 % (ref 0.0–0.2)

## 2021-07-27 LAB — BASIC METABOLIC PANEL
Anion gap: 6 (ref 5–15)
BUN: 37 mg/dL — ABNORMAL HIGH (ref 8–23)
CO2: 19 mmol/L — ABNORMAL LOW (ref 22–32)
Calcium: 7.9 mg/dL — ABNORMAL LOW (ref 8.9–10.3)
Chloride: 110 mmol/L (ref 98–111)
Creatinine, Ser: 2.49 mg/dL — ABNORMAL HIGH (ref 0.44–1.00)
GFR, Estimated: 19 mL/min — ABNORMAL LOW (ref >60)
Glucose, Bld: 213 mg/dL — ABNORMAL HIGH (ref 70–99)
Potassium: 3.8 mmol/L (ref 3.5–5.1)
Sodium: 135 mmol/L (ref 135–145)

## 2021-07-27 LAB — GLUCOSE, CAPILLARY
Glucose-Capillary: 193 mg/dL — ABNORMAL HIGH (ref 70–99)
Glucose-Capillary: 315 mg/dL — ABNORMAL HIGH (ref 70–99)
Glucose-Capillary: 224 mg/dL — ABNORMAL HIGH (ref 70–99)
Glucose-Capillary: 206 mg/dL — ABNORMAL HIGH (ref 70–99)

## 2021-07-27 LAB — CK: Total CK: 309 U/L — ABNORMAL HIGH (ref 38–234)

## 2021-07-27 MED ORDER — ORAL CARE MOUTH RINSE: 15.0000 mL | OROMUCOSAL | Status: DC | PRN

## 2021-07-27 MED ORDER — INSULIN GLARGINE-YFGN 100 UNIT/ML ~~LOC~~ SOLN
10.0000 [IU] | Freq: Every day | SUBCUTANEOUS | Status: DC
  Administered 2021-07-27: 10 [IU] via SUBCUTANEOUS
  Filled 2021-07-27 (×2): qty 0.1

## 2021-07-27 MED ORDER — ACETAMINOPHEN 500 MG PO TABS
1000.0000 mg | ORAL_TABLET | Freq: Three times a day (TID) | ORAL | Status: DC
Start: 2021-07-27 — End: 2021-07-28
  Administered 2021-07-27 – 2021-07-28 (×3): 1000 mg via ORAL
  Filled 2021-07-27 (×3): qty 2

## 2021-07-27 MED ORDER — GABAPENTIN 400 MG PO CAPS
400.0000 mg | ORAL_CAPSULE | Freq: Three times a day (TID) | ORAL | Status: DC
Start: 2021-07-27 — End: 2021-07-28
  Administered 2021-07-27 – 2021-07-28 (×4): 400 mg via ORAL
  Filled 2021-07-27 (×4): qty 1

## 2021-07-27 NOTE — Progress Notes (Signed)
PROGRESS NOTE  Nichole Munoz  DOB: 1945/04/02  PCP: [facility] [id number]  DOA: 07/23/2021  LOS: 2 days  Hospital Day: 5  Brief narrative: Nichole Munoz is a 76 y.o. female with PMH significant for DM2, HTN, CKD, GERD, diabetic neuropathy Patient presented to the ED on 7/18 with complaint of akathisia and choreiform movements.  She was apparently recently started on Remeron. Initial work-up with unremarkable blood work, CT head negative for acute changes Admitted to hospital service Neurology consultation was obtained.  Subjective: Patient was seen and examined this morning.  Lying on bed.  Her biggest complaint is pain and tingling below both knees.  Movement disorder continues.  Son at bedside.  Assessment and plan: Acute medication-induced akathisia -Apparently was started on Remeron recently for insomnia.   -Presented with 3 days history of akathisia and choreiform movements. -Neurology consultation was obtained.  Per neurology note, her neurologic examination is notable for flailing movements of RUE and RLE and some involvement of the LUE and LLE too. 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  -Remeron was switched to olanzapine. -Seems to be gradually improving.  Continue to monitor.  Bilateral leg pain -Complains of pain below both knees, more on touch.  Suggestive of peripheral neuropathy.  She was on gabapentin 400 mg 3 times daily at home which was kept on hold on admission.  I resumed it this morning.  Patient also wants to try Advil which I would not suggest because of AKI.  I have also started on scheduled Tylenol 1 g 3 times daily.  Continue to monitor.  AKI on CKD 4 -Creatinine less than 2 at baseline.  Presented with creatinine elevated 2.68.  Only gradually improving. -Continue normal saline at 75 mill per hour -Statin on hold -Continue to monitor Recent Labs     06/03/21 1521 06/03/21 2108 06/04/21 0203 06/04/21 0730 06/04/21 1125 06/11/21 2118 07/23/21 1923 07/25/21 0330 07/26/21 0537 07/27/21 0629  BUN 44* 43* 37* 34* 32* 23 31* 29* 33* 37*  CREATININE 3.05* 2.59* 2.26* 2.04* 1.90* 1.53* 2.68* 2.41* 2.50* 2.49*   Rhabdomyolysis -Elevated CK level likely due to movement disorder -Gradually improving CK level.  Continue to monitor on IV fluid Recent Labs  Lab 07/24/21 0649 07/25/21 0500 07/26/21 0537 07/27/21 0629  CKTOTAL 1,614* 1,059* 523* 309*    Uncontrolled type 2 diabetes mellitus Hypoglycemia -A1c elevated at 12.2 on 06/03/2021 -PTA on Lantus 20 units twice daily, metformin 5 mg twice daily, -Currently on Semglee 10 units twice daily -Blood sugar level this morning was low at 69.  I stopped morning dose of Semglee -Neurontin is currently on hold. Recent Labs  Lab 07/26/21 1128 07/26/21 1559 07/26/21 2136 07/27/21 0622 07/27/21 1138  GLUCAP 131* 116* 247* 193* 315*    Essential hypertension -Continue blood pressure control with amlodipine.  Lisinopril on hold   GERD without esophagitis -Continue PPI    Goals of care   Code Status: Prior    Mobility: PT eval ongoing  Skin assessment:     Nutritional status:  Body mass index is 24.67 kg/m.  Nutrition Problem: Severe Malnutrition (in the context of chronic illness) Etiology:  (indequate energy intake) Signs/Symptoms: severe muscle depletion, severe fat depletion     Diet:  Diet Order             Diet regular Room service appropriate? Yes; Fluid consistency: Thin  Diet effective now  DVT prophylaxis:  SCDs Start: 07/26/21 0749   Antimicrobials: None Fluid: NS at 63 mill per hour Consultants: Neurology Family Communication: None at bedside  Status is: Inpatient  Continue in-hospital care because: Ongoing work-up for movement disorder Level of care: Med-Surg   Dispo: The patient is from: Home               Anticipated d/c is to: Home with PT recommended              Patient currently is not medically stable to d/c.   Difficult to place patient No     Infusions:   sodium chloride 75 mL/hr at 07/26/21 1509    Scheduled Meds:  acetaminophen  1,000 mg Oral TID   feeding supplement  237 mL Oral BID BM   gabapentin  400 mg Oral TID   insulin aspart  0-9 Units Subcutaneous TID AC & HS   insulin glargine-yfgn  10 Units Subcutaneous Daily   multivitamin  1 tablet Oral Daily   OLANZapine  10 mg Oral QHS   thiamine  250 mg Oral BID    PRN meds: hydrALAZINE   Antimicrobials: Anti-infectives (From admission, onward)    None       Objective: Vitals:   07/27/21 0507 07/27/21 0837  BP: 121/60 (!) 150/77  Pulse: 91 88  Resp: 16 19  Temp: 97.9 F (36.6 C) 98.3 F (36.8 C)  SpO2: 95% 97%    Intake/Output Summary (Last 24 hours) at 07/27/2021 1157 Last data filed at 07/27/2021 0500 Gross per 24 hour  Intake 819.49 ml  Output 1200 ml  Net -380.51 ml   There were no vitals filed for this visit. Weight change:  Body mass index is 24.67 kg/m.   Physical Exam: General exam: Pleasant, elderly female.  Distress because of leg pain bilaterally Skin: No rashes, lesions or ulcers. HEENT: Atraumatic, normocephalic, no obvious bleeding Lungs: Clear to auscultation bilaterally CVS: Regular rate and rhythm, normal GI/Abd soft, nontender, nondistended, bowel sound present CNS: Alert, awake, able to answer questions in her native language.  Interpreted by son.  This morning his nonvoluntary movements continue  psychiatry: Mood appropriate Extremities: No pedal edema, no calf tenderness  Data Review: I have personally reviewed the laboratory data and studies available.  F/u labs ordered Unresulted Labs (From admission, onward)     Start     Ordered   07/24/21 0455  Copper, serum  Once,   R        07/24/21 0456            Signed, Terrilee Croak, MD [facility] 07/27/2021

## 2021-07-27 NOTE — TOC Initial Note (Addendum)
Transition of Care [facility]) - Initial/Assessment Note    Patient Details  Name: Nichole Munoz MRN: [id number] Date of Birth: December 04, 1945  Transition of [facility] CM/SW Contact:    Carles Collet, RN Phone Number: 07/27/2021, 2:42 PM  Clinical Narrative:                Damaris Schooner w patient and son Daryll Brod at bedside. Thorn speaks [place]. He requested a rollator for patient for home and a 3/1. Orders placed and requested to be delivered to room today. [place] services have been arranged through [employer]. They are instructed to call Thorn to set up first home appointment.  Patient received a RW 2 months ago and a rollator will not be covered by insurance.  Son updated No other TOC needs at this time   Expected Discharge Plan: [place] Barriers to Discharge: Continued Medical Work up   Patient Goals and CMS Choice Patient states their goals for this hospitalization and ongoing recovery are:: to go home CMS Medicare.gov Compare Post Acute Care list provided to:: Other (Comment Required) Choice offered to / list presented to : Adult Children  Expected Discharge Plan and Services Expected Discharge Plan: [place]   Discharge Planning Services: CM Consult Post Acute Care Choice: Durable Medical Equipment, Home Health Living arrangements for the past 2 months: Apartment                 DME Arranged: Walker rolling with seat DME Agency: [employer] Date DME Agency Contacted: 07/27/21 Time DME Agency Contacted: [id number] Representative spoke with at DME Agency: [place]: PT, OT [place] Agency: [place] ([place]) Date [place]: 07/27/21 Time [place]: [phone] Representative spoke with at [place]: Corene Cornea  Prior Living Arrangements/Services Living arrangements for the past 2 months: Apartment Lives with:: Adult Children   Do you feel safe going back to the place where you live?: Yes               Activities of Daily Living       Permission Sought/Granted                  Emotional Assessment              Admission diagnosis:  Involuntary movements [R25.9] Stroke-like symptoms [R29.90] Abnormal involuntary movements [R25.9] Akathisia [G25.71] Patient Active Problem List   Diagnosis Date Noted   Malnutrition of moderate degree 07/27/2021   Akathisia 07/25/2021   GERD without esophagitis 07/24/2021   Acute medication-induced akathisia 07/23/2021   Essential hypertension 01/24/2017   Asthma due to seasonal allergies 80/03/4915   Acute metabolic encephalopathy 91/50/5697   Acute renal failure superimposed on stage 3a chronic kidney disease ([place]) 04/26/2016   Stage 3a chronic kidney disease (CKD) ([place]) - baseline SCr 1.4 09/23/2014   Type 2 diabetes mellitus with hyperlipidemia ([place]) 09/23/2014   Diabetic neuropathy ([place]) 04/12/2013   PCP:  [facility] Pharmacy:   [id number] - [place], [place] [address] Phone: [phone] Fax: [phone]     Social Determinants of Health (SDOH) Interventions    Readmission Risk Interventions     No data to display

## 2021-07-27 NOTE — Progress Notes (Deleted)
error 

## 2021-07-28 DIAGNOSIS — T50905A Adverse effect of unspecified drugs, medicaments and biological substances, initial encounter: Secondary | ICD-10-CM | POA: Diagnosis not present

## 2021-07-28 LAB — GLUCOSE, CAPILLARY
Glucose-Capillary: 260 mg/dL — ABNORMAL HIGH (ref 70–99)
Glucose-Capillary: 199 mg/dL — ABNORMAL HIGH (ref 70–99)
Glucose-Capillary: 246 mg/dL — ABNORMAL HIGH (ref 70–99)

## 2021-07-28 LAB — COPPER, SERUM: Copper: 99 ug/dL (ref 80–158)

## 2021-07-28 MED ORDER — OLANZAPINE 10 MG PO TABS: 10.0000 mg | ORAL_TABLET | Freq: Every day | ORAL | 0 refills | Status: DC

## 2021-07-28 MED ORDER — INSULIN GLARGINE 100 UNIT/ML ~~LOC~~ SOLN: 20.0000 [IU] | Freq: Two times a day (BID) | SUBCUTANEOUS | 0 refills | Status: DC

## 2021-07-28 MED ORDER — ENSURE ENLIVE PO LIQD: 237.0000 mL | Freq: Two times a day (BID) | ORAL | 12 refills | Status: DC

## 2021-07-28 MED ORDER — INSULIN GLARGINE-YFGN 100 UNIT/ML ~~LOC~~ SOLN
20.0000 [IU] | Freq: Every day | SUBCUTANEOUS | Status: DC
  Administered 2021-07-28: 20 [IU] via SUBCUTANEOUS
  Filled 2021-07-28: qty 0.2

## 2021-07-28 NOTE — Discharge Summary (Signed)
Physician Discharge Summary  Nichole Munoz KJV:[id number] DOB: March 22, 1945 DOA: 07/23/2021  PCP: [facility]  Admit date: 07/23/2021 Discharge date: 07/28/2021  Admitted From: Home Discharge disposition: Home with home health  Recommendations at discharge:  Remeron has been changed to olanzapine Stop Advil, metformin because of kidney failure  Brief narrative: Nichole Munoz is a 76 y.o. female with PMH significant for DM2, HTN, CKD, GERD, diabetic neuropathy Patient presented to the ED on 7/18 with complaint of akathisia and choreiform movements.  She was apparently recently started on Remeron. Initial work-up with unremarkable blood work, CT head negative for acute changes Admitted to hospital service Neurology consultation was obtained.  Subjective: Patient was seen and examined this morning.  Lying on bed.  Her biggest complaint is pain and tingling below both knees.  Movement disorder continues.  Son at bedside.  Hospital course: Acute medication-induced akathisia -Apparently was started on Remeron recently for insomnia.   -Presented with 3 days history of akathisia and choreiform movements. -Neurology consultation was obtained.  Per neurology note, her neurologic examination is notable for flailing movements of RUE and RLE and some involvement of the LUE and LLE too. 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  -Remeron was switched to olanzapine. -Movement disorder gradually improving.  Continue olanzapine at home.  Follow-up with neurology as an outpatient.  Bilateral leg pain -7/22, patient complained of pain below both knees, more on touch.  Suggestive of peripheral neuropathy.  She was on gabapentin 400 mg 3 times daily at home which was kept on hold on admission.  I resumed gabapentin and also started on scheduled Tylenol.  Gradually improving symptoms.  Patient also wants to try  Advil which I would not suggest because of AKI.    AKI on CKD 4 -Creatinine close to 2.5 at baseline.  Presented with creatinine elevated 2.68.  Progressively improved and is at baseline as of now.  Avoid Advil Recent Labs    06/03/21 1521 06/03/21 2108 06/04/21 0203 06/04/21 0730 06/04/21 1125 06/11/21 2118 07/23/21 1923 07/25/21 0330 07/26/21 0537 07/27/21 0629  BUN 44* 43* 37* 34* 32* 23 31* 29* 33* 37*  CREATININE 3.05* 2.59* 2.26* 2.04* 1.90* 1.53* 2.68* 2.41* 2.50* 2.49*   Rhabdomyolysis -Elevated CK level likely due to movement disorder -Gradually improved CK level with IV fluid. Recent Labs  Lab 07/24/21 0649 07/25/21 0500 07/26/21 0537 07/27/21 0629  CKTOTAL 1,614* 1,059* 523* 309*    Uncontrolled type 2 diabetes mellitus Hypoglycemia -PTA on Lantus 20 units twice daily, metformin 500 mg twice daily.  But I am not sure if she was compliant to insulin because A1c is elevated to 12.2. -In the hospital, her blood sugar was low and his insulin was held.  Blood sugar picking up.  Resume insulin at home.  Stop metformin because of kidney injury. Recent Labs  Lab 07/27/21 1138 07/27/21 1700 07/27/21 2147 07/28/21 0637 07/28/21 0811  GLUCAP 315* 224* 206* 260* 199*    Essential hypertension -Continue amlodipine and lisinopril.   GERD without esophagitis -Continue PPI   Wounds:  -    Discharge Exam:   Vitals:   07/27/21 2358 07/28/21 0415 07/28/21 0500 07/28/21 0813  BP: (!) 122/52 104/64  (!) 114/57  Pulse: 91 77  74  Resp: 15 16    Temp: 98.3 F (36.8 C) 97.8 F (36.6 C)  98.9 F (37.2 C)  TempSrc: Oral Oral  Oral  SpO2: 95%  96%  95%  Weight:   56.7 kg   Height:        Body mass index is 24.41 kg/m.  General exam: Pleasant, elderly female not in distress Skin: No rashes, lesions or ulcers. HEENT: Atraumatic, normocephalic, no obvious bleeding Lungs: Clear to auscultation bilaterally CVS: Regular rate and rhythm, normal GI/Abd soft,  nontender, nondistended, bowel sound present CNS: Alert, awake, able to answer questions in her native language.  Movement disorder improving. psychiatry: Mood appropriate Extremities: No pedal edema, no calf tenderness  Follow ups:    [place], [facility] Follow up.   Specialty: Pediatrics Contact information: [place] [address] [phone]                 Discharge Instructions:   Discharge Instructions     Ambulatory referral to Neurology   Complete by: As directed    An appointment is requested in approximately: 4 weeks   Call MD for:  difficulty breathing, headache or visual disturbances   Complete by: As directed    Call MD for:  extreme fatigue   Complete by: As directed    Call MD for:  hives   Complete by: As directed    Call MD for:  persistant dizziness or light-headedness   Complete by: As directed    Call MD for:  persistant nausea and vomiting   Complete by: As directed    Call MD for:  severe uncontrolled pain   Complete by: As directed    Call MD for:  temperature >100.4   Complete by: As directed    Diet Carb Modified   Complete by: As directed    Discharge instructions   Complete by: As directed    Recommendations at discharge:   Remeron has been changed to olanzapine  Stop Advil, metformin because of kidney failure  General discharge instructions: Follow with [facility] in 7 days  Please request your PCP  to go over your hospital tests, procedures, radiology results at the follow up. Please get your medicines reviewed and adjusted.  Your PCP may decide to repeat certain labs or tests as needed. Do not drive, operate heavy machinery, perform activities at heights, swimming or participation in water activities or provide baby sitting services if your were admitted for syncope or siezures until you have seen by [facility] or a  Neurologist and advised to do so again. [place] Controlled Substance Reporting System database was reviewed. Do not drive, operate heavy machinery, perform activities at heights, swim, participate in water activities or provide baby-sitting services while on medications for pain, sleep and mood until your outpatient physician has reevaluated you and advised to do so again.  You are strongly recommended to comply with the dose, frequency and duration of prescribed medications. Activity: As tolerated with Full fall precautions use walker/cane & assistance as needed Avoid using any recreational substances like cigarette, tobacco, alcohol, or non-prescribed drug. If you experience worsening of your admission symptoms, develop shortness of breath, life threatening emergency, suicidal or homicidal thoughts you must seek medical attention immediately by calling 911 or calling your MD immediately  if symptoms less severe. You must read complete instructions/literature along with all the possible adverse reactions/side effects for all the medicines you take and that have been prescribed to you. Take any new medicine only after you have completely understood and accepted all the possible adverse  reactions/side effects.  Wear Seat belts while driving. You were cared for by a hospitalist during your hospital stay. If you have any questions about your discharge medications or the care you received while you were in the hospital after you are discharged, you can call the unit and ask to speak with the hospitalist or the covering physician. Once you are discharged, your primary care physician will handle any further medical issues. Please note that NO REFILLS for any discharge medications will be authorized once you are discharged, as it is imperative that you return to your primary care physician (or establish a relationship with a primary care physician if you do not have one).   Increase activity slowly    Complete by: As directed        Discharge Medications:   Allergies as of 07/28/2021       Reactions   Other Other (See Comments)   Chicken causes itching/  clorox, Bleach causes shortness of breath   Chicken Allergy    No Known Allergies    Previous listing of unable to tolerate SMELL of Clorox and Chicken REACTION NOT AN ALLERGY         Medication List     STOP taking these medications    metFORMIN 500 MG tablet Commonly known as: GLUCOPHAGE   mirtazapine 15 MG tablet Commonly known as: REMERON       TAKE these medications    acetaminophen 500 MG tablet Commonly known as: TYLENOL Take 1,000 mg by mouth every 6 (six) hours as needed for mild pain.   albuterol 108 (90 Base) MCG/ACT inhaler Commonly known as: VENTOLIN HFA Inhale 2 puffs into the lungs every 6 (six) hours as needed for wheezing or shortness of breath.   blood glucose meter kit and supplies Dispense based on patient and insurance preference. Use up to four times daily as directed. (FOR ICD-10 E10.9, E11.9).   diclofenac Sodium 1 % Gel Commonly known as: Voltaren Apply 2 g topically 4 (four) times daily. What changed:  when to take this reasons to take this   feeding supplement Liqd Take 237 mLs by mouth 2 (two) times daily between meals.   gabapentin 400 MG capsule Commonly known as: NEURONTIN Take 400 mg by mouth 3 (three) times daily.   insulin aspart 100 UNIT/ML FlexPen Commonly known as: NOVOLOG Inject 1-9 Units into the skin 4 (four) times daily -  before meals and at bedtime. Glucose 121 - 150: 1 unit, Glucose 151 - 200: 2 units, Glucose 201 - 250: 3 units, Glucose 251 - 300: 5 units, Glucose 301 - 350: 7 units, Glucose 351 - 400: 9 units, Glucose > 400 call MD   insulin glargine 100 UNIT/ML injection Commonly known as: LANTUS Inject 0.2 mLs (20 Units total) into the skin 2 (two) times daily.   Insulin Pen Needle 31G X 5 MM Misc Use up to 4 times daily with insulin    lisinopril 10 MG tablet Commonly known as: ZESTRIL Take 10 mg by mouth daily.   OLANZapine 10 MG tablet Commonly known as: ZYPREXA Take 1 tablet (10 mg total) by mouth at bedtime.   rosuvastatin 40 MG tablet Commonly known as: CRESTOR Take 40 mg by mouth daily.   Symbicort 160-4.5 MCG/ACT inhaler Generic drug: budesonide-formoterol Inhale 2 puffs into the lungs 2 (two) times daily as needed (shortness of breath).               Durable Medical Equipment  (From admission,  onward)           Start     Ordered   07/27/21 1445  For home use only DME 3 n 1  Once        07/27/21 1444   07/27/21 1445  For home use only DME 4 wheeled rolling walker with seat  Once       Question:  Patient needs a walker to treat with the following condition  Answer:  Weakness   07/27/21 1444             The results of significant diagnostics from this hospitalization (including imaging, microbiology, ancillary and laboratory) are listed below for reference.    Procedures and Diagnostic Studies:   MR BRAIN WO CONTRAST  Result Date: 07/24/2021 CLINICAL DATA:  Initial evaluation for neuro deficit, stroke suspected. EXAM: MRI HEAD WITHOUT CONTRAST TECHNIQUE: Multiplanar, multiecho pulse sequences of the brain and surrounding structures were obtained without intravenous contrast. COMPARISON:  Prior head CT from earlier the same day. FINDINGS: Brain: Examination severely limited as the patient was unable to tolerate the exam. Axial coronal DWI with axial FLAIR sequences only were performed. Additionally, provided images are severely degraded by motion artifact. Cerebral volume grossly within normal limits for age. Probable mild chronic microvascular ischemic disease noted. No convincing foci of restricted diffusion to suggest acute or subacute ischemia. Gray-white matter differentiation grossly maintained. No visible areas of chronic cortical infarction. No visible mass lesion, mass effect or  midline shift. Ventricles grossly within normal limits for size without hydrocephalus. No visible extra-axial fluid collection. Vascular: Not well assessed on this limited exam. Skull and upper cervical spine: Not well assessed on this limited exam. Sinuses/Orbits: Not well assessed on this limited exam. Other: None. IMPRESSION: 1. Severely limited study due to the patient's inability to tolerate the exam. Diffusion and FLAIR sequences only were performed. Additionally, the provided images are severely degraded by motion. 2. No definite acute intracranial infarct or other abnormality. Electronically Signed   By: Jeannine Boga M.D.   On: 07/24/2021 00:17   CT HEAD WO CONTRAST  Result Date: 07/23/2021 CLINICAL DATA:  Left-sided facial droop. EXAM: CT HEAD WITHOUT CONTRAST TECHNIQUE: Contiguous axial images were obtained from the base of the skull through the vertex without intravenous contrast. RADIATION DOSE REDUCTION: This exam was performed according to the departmental dose-optimization program which includes automated exposure control, adjustment of the mA and/or kV according to patient size and/or use of iterative reconstruction technique. COMPARISON:  CT head dated Jun 03, 2021. FINDINGS: Brain: No evidence of acute infarction, hemorrhage, hydrocephalus, extra-axial collection or mass lesion/mass effect. Stable mild chronic microvascular ischemic changes. Vascular: Calcified atherosclerosis at the skull base. No hyperdense vessel. Skull: Normal. Negative for fracture or focal lesion. Sinuses/Orbits: No acute finding. Other: None. IMPRESSION: 1. No acute intracranial abnormality. Electronically Signed   By: Titus Dubin M.D.   On: 07/23/2021 21:21   DG Chest Portable 1 View  Result Date: 07/23/2021 CLINICAL DATA:  Fever left-sided weakness EXAM: PORTABLE CHEST 1 VIEW COMPARISON:  02/25/2021 FINDINGS: Rotated patient. Right lung grossly clear. Possible mild airspace disease at left base. Stable  cardiomediastinal silhouette with aortic atherosclerosis. No pneumothorax. IMPRESSION: Possible mild atelectasis or developing infiltrate at the left base. Electronically Signed   By: Donavan Foil M.D.   On: 07/23/2021 20:17     Labs:   Basic Metabolic Panel: Recent Labs  Lab 07/23/21 1923 07/25/21 0330 07/26/21 0537 07/27/21 0629  NA 143 144 144 135  K 4.3 3.3* 3.4* 3.8  CL 106 110 112* 110  CO2 $Re'24 24 24 'UiX$ 19*  GLUCOSE 140* 126* 67* 213*  BUN 31* 29* 33* 37*  CREATININE 2.68* 2.41* 2.50* 2.49*  CALCIUM 9.2 9.0 8.7* 7.9*   GFR Estimated Creatinine Clearance: 14.9 mL/min (A) (by C-G formula based on SCr of 2.49 mg/dL (H)). Liver Function Tests: Recent Labs  Lab 07/24/21 0452  AST 41  ALT 20  ALKPHOS 63  BILITOT 0.8  PROT 6.6  ALBUMIN 3.4*   No results for input(s): "LIPASE", "AMYLASE" in the last 168 hours. No results for input(s): "AMMONIA" in the last 168 hours. Coagulation profile No results for input(s): "INR", "PROTIME" in the last 168 hours.  CBC: Recent Labs  Lab 07/23/21 1923 07/27/21 0629  WBC 9.4 8.0  NEUTROABS  --  4.3  HGB 11.3* 9.9*  HCT 32.5* 28.7*  MCV 84.4 85.7  PLT 211 165   Cardiac Enzymes: Recent Labs  Lab 07/24/21 0649 07/25/21 0500 07/26/21 0537 07/27/21 0629  CKTOTAL 1,614* 1,059* 523* 309*   BNP: Invalid input(s): "POCBNP" CBG: Recent Labs  Lab 07/27/21 1138 07/27/21 1700 07/27/21 2147 07/28/21 0637 07/28/21 0811  GLUCAP 315* 224* 206* 260* 199*   D-Dimer No results for input(s): "DDIMER" in the last 72 hours. Hgb A1c No results for input(s): "HGBA1C" in the last 72 hours. Lipid Profile No results for input(s): "CHOL", "HDL", "LDLCALC", "TRIG", "CHOLHDL", "LDLDIRECT" in the last 72 hours. Thyroid function studies No results for input(s): "TSH", "T4TOTAL", "T3FREE", "THYROIDAB" in the last 72 hours.  Invalid input(s): "FREET3" Anemia work up No results for input(s): "VITAMINB12", "FOLATE", "FERRITIN", "TIBC",  "IRON", "RETICCTPCT" in the last 72 hours. Microbiology Recent Results (from the past 240 hour(s))  SARS Coronavirus 2 by RT PCR (hospital order, performed in [facility] hospital lab) *cepheid single result test* Anterior Nasal Swab     Status: None   Collection Time: 07/23/21  8:05 PM   Specimen: Anterior Nasal Swab  Result Value Ref Range Status   SARS Coronavirus 2 by RT PCR NEGATIVE NEGATIVE Final    Comment: (NOTE) SARS-CoV-2 target nucleic acids are NOT DETECTED.  The SARS-CoV-2 RNA is generally detectable in upper and lower respiratory specimens during the acute phase of infection. The lowest concentration of SARS-CoV-2 viral copies this assay can detect is 250 copies / mL. A negative result does not preclude SARS-CoV-2 infection and should not be used as the sole basis for treatment or other patient management decisions.  A negative result may occur with improper specimen collection / handling, submission of specimen other than nasopharyngeal swab, presence of viral mutation(s) within the areas targeted by this assay, and inadequate number of viral copies (<250 copies / mL). A negative result must be combined with clinical observations, patient history, and epidemiological information.  Fact Sheet for Patients:   https://www.patel.info/  Fact Sheet for Healthcare Providers: https://hall.com/  This test is not yet approved or  cleared by the [place] FDA and has been authorized for detection and/or diagnosis of SARS-CoV-2 by FDA under an Emergency Use Authorization (EUA).  This EUA will remain in effect (meaning this test can be used) for the duration of the COVID-19 declaration under Section 564(b)(1) of the Act, 21 U.S.C. section 360bbb-3(b)(1), unless the authorization is terminated or revoked sooner.  Performed at [facility], [address], [place], [place] [address]     Time coordinating discharge: 35  minutes  Signed: Caila Cirelli  [facility] 07/28/2021,  11:48 AM

## 2021-09-04 IMAGING — CR DG RIBS W/ CHEST 3+V*L*
5 series · 5 of 5 positions shown · non-contrast
Comparison: February 18, 2018.

CLINICAL DATA: Left rib pain after fall.

EXAM:
LEFT RIBS AND CHEST - 3+ VIEW

[w chest pa]
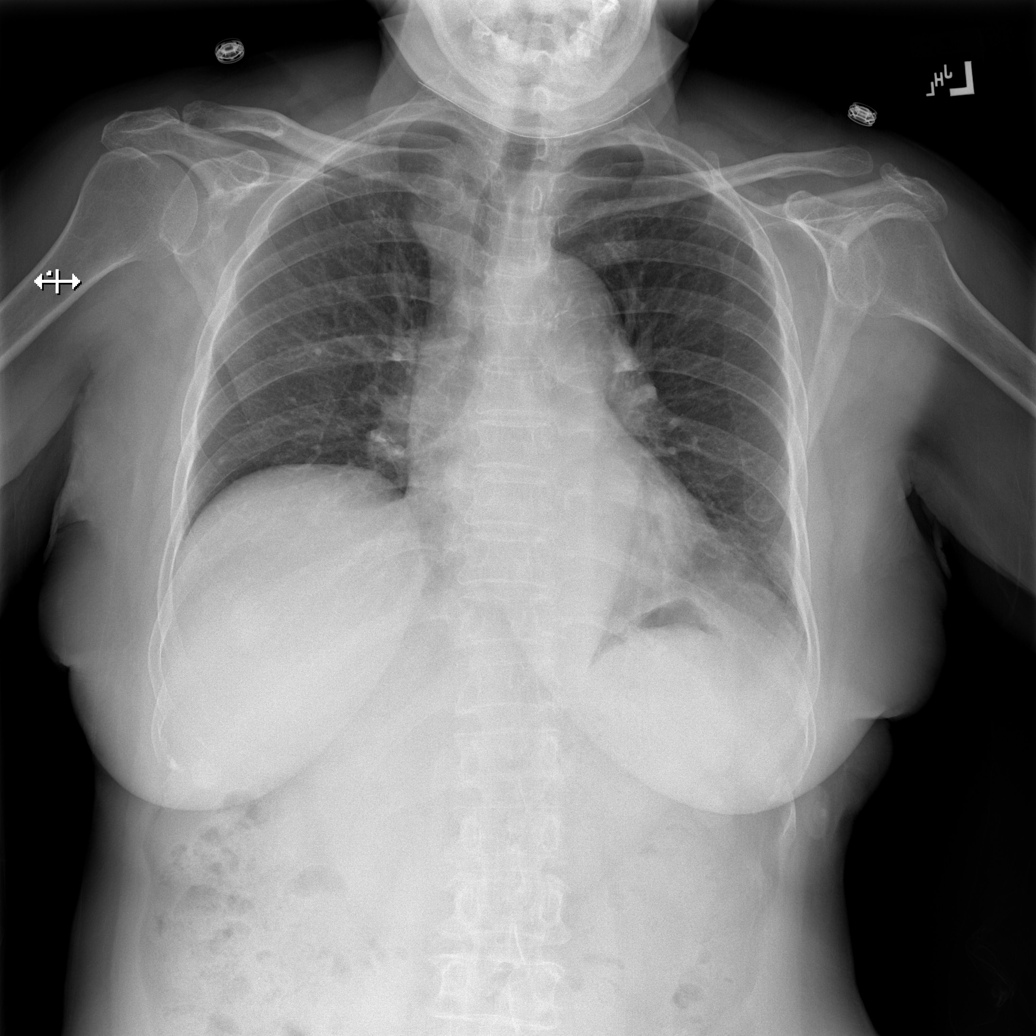

[w ribs ap upper left]
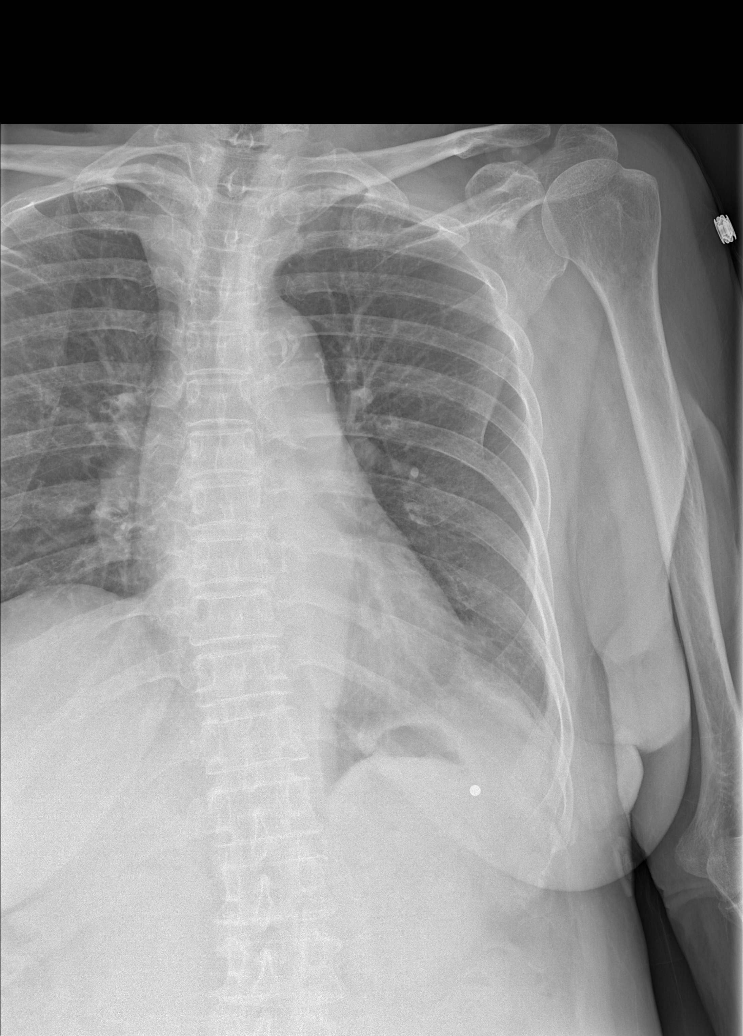

[w ribs ap lower left]
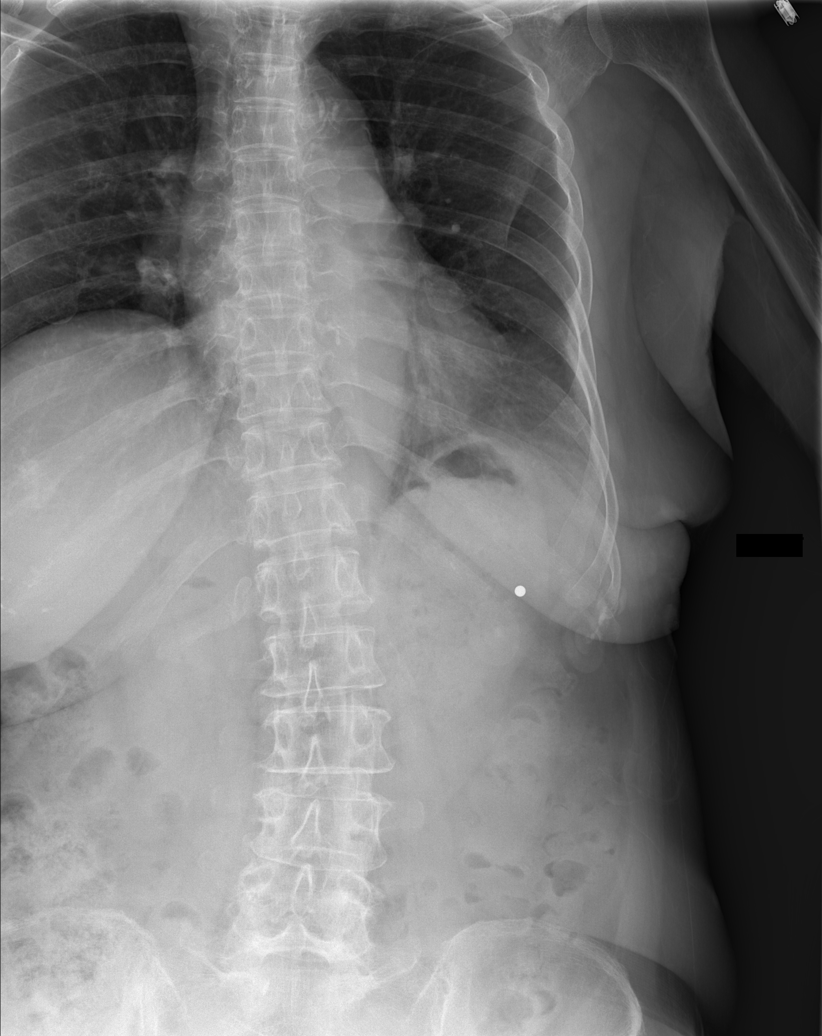

[w ribs obl left (1 of 2)]
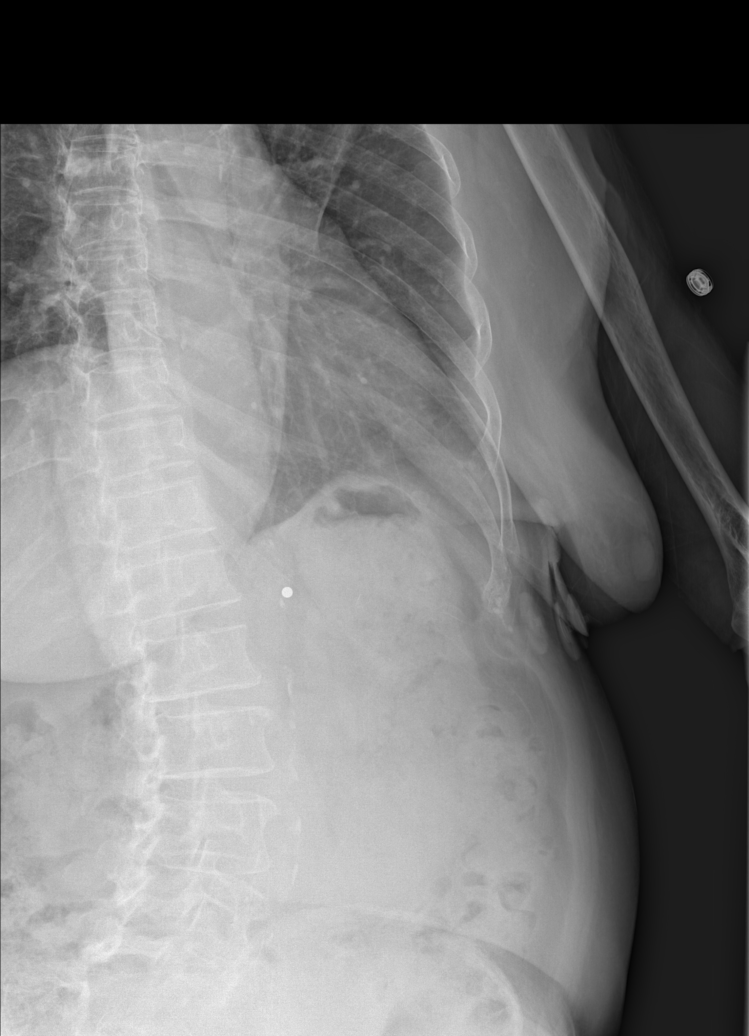

[w ribs obl left (2 of 2)]
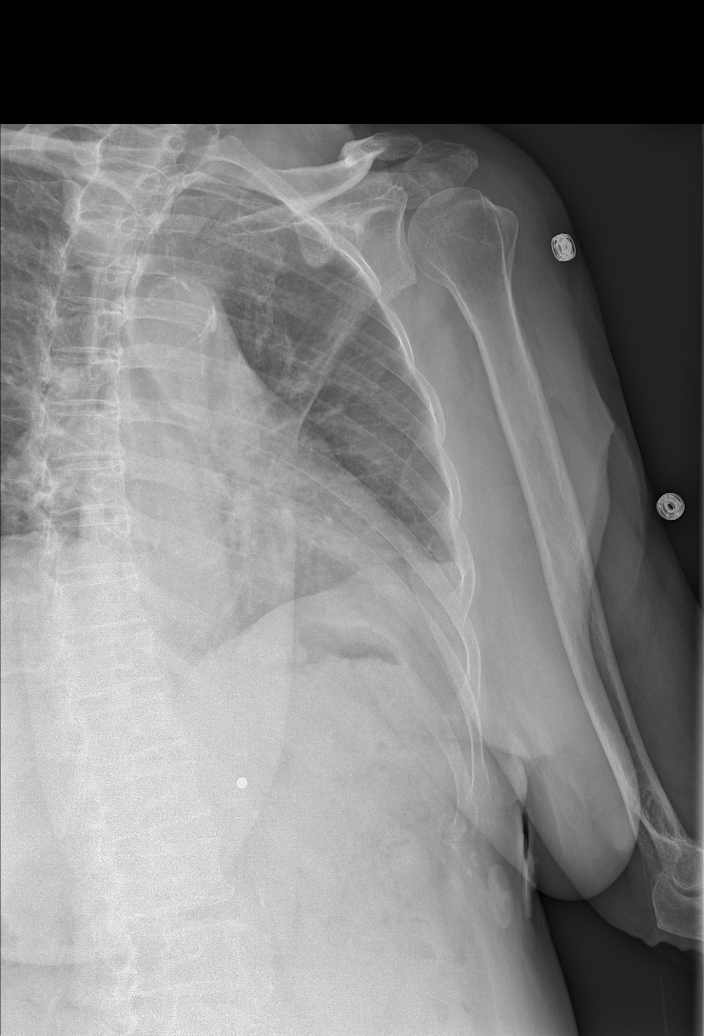

[5 of 5 positions shown; findings below may reference images not displayed]

FINDINGS: No fracture or other bone lesions are seen involving the ribs. There
is no evidence of pneumothorax or pleural effusion. Both lungs are
clear. Heart size and mediastinal contours are within normal limits.
IMPRESSION: Negative.

## 2021-09-16 ENCOUNTER — Emergency Department (HOSPITAL_COMMUNITY): Payer: Medicare Other

## 2021-09-16 ENCOUNTER — Emergency Department (HOSPITAL_COMMUNITY)
Admission: EM | Admit: 2021-09-16 | Discharge: 2021-09-16 | Disposition: A | Discharge status: Home / Self Care | Payer: Medicare Other | Attending: Emergency Medicine | Admitting: Emergency Medicine

## 2021-09-16 ENCOUNTER — Other Ambulatory Visit: Payer: Self-pay

## 2021-09-16 DIAGNOSIS — R112 Nausea with vomiting, unspecified: Secondary | ICD-10-CM | POA: Diagnosis not present

## 2021-09-16 DIAGNOSIS — R404 Transient alteration of awareness: Secondary | ICD-10-CM | POA: Diagnosis not present

## 2021-09-16 DIAGNOSIS — G253 Myoclonus: Secondary | ICD-10-CM | POA: Diagnosis not present

## 2021-09-16 DIAGNOSIS — R55 Syncope and collapse: Secondary | ICD-10-CM | POA: Insufficient documentation

## 2021-09-16 DIAGNOSIS — R42 Dizziness and giddiness: Secondary | ICD-10-CM | POA: Diagnosis not present

## 2021-09-16 DIAGNOSIS — R6889 Other general symptoms and signs: Secondary | ICD-10-CM | POA: Diagnosis not present

## 2021-09-16 DIAGNOSIS — R259 Unspecified abnormal involuntary movements: Secondary | ICD-10-CM | POA: Diagnosis not present

## 2021-09-16 DIAGNOSIS — R079 Chest pain, unspecified: Secondary | ICD-10-CM | POA: Diagnosis not present

## 2021-09-16 DIAGNOSIS — Z743 Need for continuous supervision: Secondary | ICD-10-CM | POA: Diagnosis not present

## 2021-09-16 LAB — CBC WITH DIFFERENTIAL/PLATELET
Abs Immature Granulocytes: 0.02 K/uL (ref 0.00–0.07)
Basophils Absolute: 0 K/uL (ref 0.0–0.1)
Basophils Relative: 1 %
Eosinophils Absolute: 1 K/uL — ABNORMAL HIGH (ref 0.0–0.5)
Eosinophils Relative: 12 %
HCT: 31.8 % — ABNORMAL LOW (ref 36.0–46.0)
Hemoglobin: 10.5 g/dL — ABNORMAL LOW (ref 12.0–15.0)
Immature Granulocytes: 0 %
Lymphocytes Relative: 26 %
Lymphs Abs: 2.2 K/uL (ref 0.7–4.0)
MCH: 30.5 pg (ref 26.0–34.0)
MCHC: 33 g/dL (ref 30.0–36.0)
MCV: 92.4 fL (ref 80.0–100.0)
Monocytes Absolute: 0.5 K/uL (ref 0.1–1.0)
Monocytes Relative: 6 %
Neutro Abs: 4.5 K/uL (ref 1.7–7.7)
Neutrophils Relative %: 55 %
Platelets: 206 K/uL (ref 150–400)
RBC: 3.44 MIL/uL — ABNORMAL LOW (ref 3.87–5.11)
RDW: 11.8 % (ref 11.5–15.5)
WBC: 8.2 K/uL (ref 4.0–10.5)
nRBC: 0 % (ref 0.0–0.2)

## 2021-09-16 LAB — COMPREHENSIVE METABOLIC PANEL WITH GFR
ALT: 10 U/L (ref 0–44)
AST: 18 U/L (ref 15–41)
Albumin: 3.4 g/dL — ABNORMAL LOW (ref 3.5–5.0)
Alkaline Phosphatase: 63 U/L (ref 38–126)
Anion gap: 13 (ref 5–15)
BUN: 22 mg/dL (ref 8–23)
CO2: 26 mmol/L (ref 22–32)
Calcium: 8.9 mg/dL (ref 8.9–10.3)
Chloride: 102 mmol/L (ref 98–111)
Creatinine, Ser: 1.59 mg/dL — ABNORMAL HIGH (ref 0.44–1.00)
GFR, Estimated: 33 mL/min — ABNORMAL LOW
Glucose, Bld: 178 mg/dL — ABNORMAL HIGH (ref 70–99)
Potassium: 4.4 mmol/L (ref 3.5–5.1)
Sodium: 141 mmol/L (ref 135–145)
Total Bilirubin: 0.7 mg/dL (ref 0.3–1.2)
Total Protein: 7 g/dL (ref 6.5–8.1)

## 2021-09-16 LAB — TROPONIN I (HIGH SENSITIVITY)
Troponin I (High Sensitivity): 11 ng/L
Troponin I (High Sensitivity): 13 ng/L

## 2021-09-16 MED ORDER — LACTATED RINGERS IV SOLN: INTRAVENOUS | Status: DC

## 2021-09-16 MED ORDER — LACTATED RINGERS IV BOLUS
1000.0000 mL | Freq: Once | INTRAVENOUS | Status: AC
  Administered 2021-09-16: 1000 mL via INTRAVENOUS

## 2021-09-16 MED ORDER — BENZTROPINE MESYLATE 1 MG/ML IJ SOLN
0.5000 mg | Freq: Once | INTRAMUSCULAR | Status: AC
  Administered 2021-09-16: 0.5 mg via INTRAVENOUS
  Filled 2021-09-16: qty 0.5

## 2021-09-16 NOTE — ED Provider Notes (Signed)
[facility] EMERGENC[facility] DEPARTMENT Provider Note   CSN: [id number] Arrival date & time: 09/16/21  1115     History  Chief Complaint  Patient presents with   Loss of Consciousness    Nichole Munoz is a 76 y.o. female.  76 year old female presents after having witnessed syncopal event just prior to arrival.  Patient states that she became lightheaded and dizzy and was helped to the ground by her family.  Was unconscious for about a minute or so.  Had no chest pain or severe headaches prior to the incident.  Does have history of CVA and states that for the past month she has had some involuntary muscle twitching.  Denies any new medications being used.  EMS was called and blood sugar was over 100 and she was transported here.  They report no postictal period.  Interpreter used for this encounter     Home Medications Prior to Admission medications   Medication Sig Start Date End Date Taking? Authorizing Provider  acetaminophen (TYLENOL) 500 MG tablet Take 1,000 mg by mouth every 6 (six) hours as needed for mild pain.    [provider]  albuterol (PROVENTIL HFA;VENTOLIN HFA) 108 (90 Base) MCG/ACT inhaler Inhale 2 puffs into the lungs every 6 (six) hours as needed for wheezing or shortness of breath. 02/21/18   Nita Sells, MD  blood glucose meter kit and supplies Dispense based on patient and insurance preference. Use up to four times daily as directed. (FOR ICD-10 E10.9, E11.9). 01/26/17   Dessa Phi, DO  diclofenac Sodium (VOLTAREN) 1 % GEL Apply 2 g topically 4 (four) times daily. Patient taking differently: Apply 2 g topically 4 (four) times daily as needed (pain). 02/19/19   Khatri, Hina, PA-C  feeding supplement (ENSURE ENLIVE / ENSURE PLUS) LIQD Take 237 mLs by mouth 2 (two) times daily between meals. 07/28/21   Terrilee Croak, MD  gabapentin (NEURONTIN) 400 MG capsule Take 400 mg by mouth 3 (three) times daily. 06/19/21   [provider]   insulin aspart (NOVOLOG) 100 UNIT/ML FlexPen Inject 1-9 Units into the skin 4 (four) times daily -  before meals and at bedtime. Glucose 121 - 150: 1 unit, Glucose 151 - 200: 2 units, Glucose 201 - 250: 3 units, Glucose 251 - 300: 5 units, Glucose 301 - 350: 7 units, Glucose 351 - 400: 9 units, Glucose > 400 call MD 06/04/21   Dwyane Dee, MD  insulin glargine (LANTUS) 100 UNIT/ML injection Inject 0.2 mLs (20 Units total) into the skin 2 (two) times daily. 07/28/21 10/26/21  Terrilee Croak, MD  Insulin Pen Needle 31G X 5 MM MISC Use up to 4 times daily with insulin 01/26/17   Dessa Phi, DO  lisinopril (ZESTRIL) 10 MG tablet Take 10 mg by mouth daily. 07/31/19   [provider]  OLANZapine (ZYPREXA) 10 MG tablet Take 1 tablet (10 mg total) by mouth at bedtime. 07/28/21   Terrilee Croak, MD  rosuvastatin (CRESTOR) 40 MG tablet Take 40 mg by mouth daily. 04/29/21   [provider]  SYMBICORT 160-4.5 MCG/ACT inhaler Inhale 2 puffs into the lungs 2 (two) times daily as needed (shortness of breath). 05/26/21   [provider]      Allergies    Other, Chicken allergy, and No known allergies    Review of Systems   Review of Systems  All other systems reviewed and are negative.  Physical Exam Updated Vital Signs BP 109/65 (BP Location: Right Arm)  Pulse 85   Temp 99.1 F (37.3 C) (Oral)   Resp 16   SpO2 100%  Physical Exam Vitals and nursing note reviewed.  Constitutional:      General: She is not in acute distress.    Appearance: Normal appearance. She is well-developed. She is not toxic-appearing.  HENT:     Head: Normocephalic and atraumatic.  Eyes:     General: Lids are normal.     Conjunctiva/sclera: Conjunctivae normal.     Pupils: Pupils are equal, round, and reactive to light.  Neck:     Thyroid: No thyroid mass.     Trachea: No tracheal deviation.  Cardiovascular:     Rate and Rhythm: Normal rate and regular rhythm.     Heart sounds: Normal heart  sounds. No murmur heard.    No gallop.  Pulmonary:     Effort: Pulmonary effort is normal. No respiratory distress.     Breath sounds: Normal breath sounds. No stridor. No decreased breath sounds, wheezing, rhonchi or rales.  Abdominal:     General: There is no distension.     Palpations: Abdomen is soft.     Tenderness: There is no abdominal tenderness. There is no rebound.  Musculoskeletal:        General: No tenderness. Normal range of motion.     Cervical back: Normal range of motion and neck supple.  Skin:    General: Skin is warm and dry.     Findings: No abrasion or rash.  Neurological:     Mental Status: She is alert and oriented to person, place, and time. Mental status is at baseline.     GCS: GCS eye subscore is 4. GCS verbal subscore is 5. GCS motor subscore is 6.     Cranial Nerves: No cranial nerve deficit.     Sensory: No sensory deficit.     Motor: Abnormal muscle tone present.     Comments: Myoclonic jerks noted  Psychiatric:        Attention and Perception: Attention normal.        Speech: Speech normal.        Behavior: Behavior normal.   ED Results / Procedures / Treatments   Labs (all labs ordered are listed, but only abnormal results are displayed) Labs Reviewed  CBC WITH DIFFERENTIAL/PLATELET  COMPREHENSIVE METABOLIC PANEL  TROPONIN I (HIGH SENSITIVITY)    EKG EKG Interpretation  Date/Time:  Monday September 16 2021 12:01:19 EDT Ventricular Rate:  77 PR Interval:  139 QRS Duration: 97 QT Interval:  386 QTC Calculation: 437 R Axis:   0 Text Interpretation: Sinus rhythm Borderline T abnormalities, lateral leads No significant change since last tracing Confirmed by Lacretia Leigh ([id number]) on 09/16/2021 12:16:44 PM  Radiology CT Head Wo Contrast  Result Date: 09/16/2021 CLINICAL DATA:  Dizziness, loss of consciousness. Nausea and vomiting. EXAM: CT HEAD WITHOUT CONTRAST TECHNIQUE: Contiguous axial images were obtained from the base of the skull  through the vertex without intravenous contrast. RADIATION DOSE REDUCTION: This exam was performed according to the departmental dose-optimization program which includes automated exposure control, adjustment of the mA and/or kV according to patient size and/or use of iterative reconstruction technique. COMPARISON:  07/23/2021 FINDINGS: Despite efforts by the technologist and patient, motion artifact is present on today's exam and could not be eliminated, despite rescanning attempts. This reduces exam sensitivity and specificity. Brain: The brainstem, cerebellum, cerebral peduncles, thalami, basal ganglia, basilar cisterns, and ventricular system appear within normal limits. Periventricular white matter  and corona radiata hypodensities favor chronic ischemic microvascular white matter disease. No intracranial hemorrhage, mass lesion, or acute CVA. Vascular: There is atherosclerotic calcification of the cavernous carotid arteries bilaterally. Skull: Unremarkable Sinuses/Orbits: Unremarkable Other: No supplemental non-categorized findings. IMPRESSION: 1. No acute intracranial findings. 2. Reduced exam sensitivity and specificity because of motion artifact, despite efforts and rescanning attempts. 3. Periventricular white matter and corona radiata hypodensities favor chronic ischemic microvascular white matter disease. 4. Atherosclerosis. Electronically Signed   By: Van Clines M.D.   On: 09/16/2021 14:07   DG Chest Port 1 View  Result Date: 09/16/2021 CLINICAL DATA:  Chest pain. EXAM: PORTABLE CHEST 1 VIEW COMPARISON:  AP chest 07/23/2021 FINDINGS: Cardiac silhouette is again mildly enlarged. Mild-to-moderate calcification within the thoracic aorta. Tortuous thoracic aorta. The lungs are clear. No pleural effusion or pneumothorax. Mild multilevel degenerative disc changes of the thoracic spine. IMPRESSION: No acute cardiopulmonary disease process. Electronically Signed   By: Yvonne Kendall M.D.   On:  09/16/2021 12:19    Procedures Procedures    Medications Ordered in ED Medications  lactated ringers bolus 1,000 mL (has no administration in time range)  lactated ringers infusion (has no administration in time range)    ED Course/ Medical Decision Making/ A&P                           Medical Decision Making Amount and/or Complexity of Data Reviewed Labs: ordered. Radiology: ordered. ECG/medicine tests: ordered.  Risk Prescription drug management.   Patient is EKG per interpretation shows normal sinus rhythm.  No signs of acute ischemic changes.  Patient with syncopal event here.  Concern for possible intracranial etiology.  Head CT per my interpretation without acute findings.  Chest x-ray per my interpretation shows no acute findings.  Patient's creatinine is stable here.  Was given IV fluids.  Patient reassessed and feels much better.  Suspect vagal episode.  Low suspicion for cardiac arrhythmia.  CBC shows no evidence of anemia.  Troponin x2 were negative.  Plan will be to discharge.  This was all discussed with the help of a video interpreter.        Final Clinical Impression(s) / ED Diagnoses Final diagnoses:  None    Rx / DC Orders ED Discharge Orders     None         Lacretia Leigh, MD 09/16/21 1552

## 2021-09-16 NOTE — ED Triage Notes (Addendum)
Patient bib [facility], pt was at a restaurant and complained that she felt pressure and dizziness in her head. LOC was witnessed family caught her and head was not hit. Nausea and vomiting did occur. HX of stroke, no new neurological deficits.

## 2022-01-29 DIAGNOSIS — E782 Mixed hyperlipidemia: Secondary | ICD-10-CM | POA: Diagnosis not present

## 2022-01-29 DIAGNOSIS — J4520 Mild intermittent asthma, uncomplicated: Secondary | ICD-10-CM | POA: Diagnosis not present

## 2022-01-29 DIAGNOSIS — Z23 Encounter for immunization: Secondary | ICD-10-CM | POA: Diagnosis not present

## 2022-01-29 DIAGNOSIS — E1165 Type 2 diabetes mellitus with hyperglycemia: Secondary | ICD-10-CM | POA: Diagnosis not present

## 2022-01-29 DIAGNOSIS — I10 Essential (primary) hypertension: Secondary | ICD-10-CM | POA: Diagnosis not present

## 2022-01-29 DIAGNOSIS — Z794 Long term (current) use of insulin: Secondary | ICD-10-CM | POA: Diagnosis not present

## 2022-01-29 DIAGNOSIS — E1122 Type 2 diabetes mellitus with diabetic chronic kidney disease: Secondary | ICD-10-CM | POA: Diagnosis not present

## 2022-01-29 DIAGNOSIS — N1832 Chronic kidney disease, stage 3b: Secondary | ICD-10-CM | POA: Diagnosis not present

## 2022-01-29 DIAGNOSIS — E1142 Type 2 diabetes mellitus with diabetic polyneuropathy: Secondary | ICD-10-CM | POA: Diagnosis not present

## 2022-01-29 DIAGNOSIS — J4530 Mild persistent asthma, uncomplicated: Secondary | ICD-10-CM | POA: Diagnosis not present

## 2022-05-09 DIAGNOSIS — L210 Seborrhea capitis: Secondary | ICD-10-CM | POA: Diagnosis not present

## 2022-05-09 DIAGNOSIS — E782 Mixed hyperlipidemia: Secondary | ICD-10-CM | POA: Diagnosis not present

## 2022-05-09 DIAGNOSIS — J4530 Mild persistent asthma, uncomplicated: Secondary | ICD-10-CM | POA: Diagnosis not present

## 2022-05-09 DIAGNOSIS — E1165 Type 2 diabetes mellitus with hyperglycemia: Secondary | ICD-10-CM | POA: Diagnosis not present

## 2022-05-09 DIAGNOSIS — N1832 Chronic kidney disease, stage 3b: Secondary | ICD-10-CM | POA: Diagnosis not present

## 2022-05-09 DIAGNOSIS — I10 Essential (primary) hypertension: Secondary | ICD-10-CM | POA: Diagnosis not present

## 2022-05-09 DIAGNOSIS — Z794 Long term (current) use of insulin: Secondary | ICD-10-CM | POA: Diagnosis not present

## 2022-05-09 DIAGNOSIS — Z0000 Encounter for general adult medical examination without abnormal findings: Secondary | ICD-10-CM | POA: Diagnosis not present

## 2022-05-09 DIAGNOSIS — Z8673 Personal history of transient ischemic attack (TIA), and cerebral infarction without residual deficits: Secondary | ICD-10-CM | POA: Diagnosis not present

## 2022-05-09 DIAGNOSIS — E1122 Type 2 diabetes mellitus with diabetic chronic kidney disease: Secondary | ICD-10-CM | POA: Diagnosis not present

## 2022-05-09 DIAGNOSIS — E1142 Type 2 diabetes mellitus with diabetic polyneuropathy: Secondary | ICD-10-CM | POA: Diagnosis not present

## 2022-05-12 ENCOUNTER — Other Ambulatory Visit: Payer: Self-pay | Admitting: Physician Assistant

## 2022-05-12 DIAGNOSIS — Z1231 Encounter for screening mammogram for malignant neoplasm of breast: Secondary | ICD-10-CM

## 2022-06-04 ENCOUNTER — Emergency Department (HOSPITAL_COMMUNITY)
Admission: EM | Admit: 2022-06-04 | Discharge: 2022-06-04 | Disposition: A | Discharge status: Home / Self Care | Payer: 59 | Attending: Emergency Medicine | Admitting: Emergency Medicine

## 2022-06-04 ENCOUNTER — Emergency Department (HOSPITAL_COMMUNITY): Payer: 59

## 2022-06-04 ENCOUNTER — Other Ambulatory Visit: Payer: Self-pay

## 2022-06-04 ENCOUNTER — Encounter (HOSPITAL_COMMUNITY): Payer: Self-pay

## 2022-06-04 DIAGNOSIS — R4182 Altered mental status, unspecified: Secondary | ICD-10-CM | POA: Insufficient documentation

## 2022-06-04 DIAGNOSIS — E161 Other hypoglycemia: Secondary | ICD-10-CM | POA: Diagnosis not present

## 2022-06-04 DIAGNOSIS — I700 Atherosclerosis of aorta: Secondary | ICD-10-CM | POA: Diagnosis not present

## 2022-06-04 DIAGNOSIS — J189 Pneumonia, unspecified organism: Secondary | ICD-10-CM

## 2022-06-04 DIAGNOSIS — E11649 Type 2 diabetes mellitus with hypoglycemia without coma: Secondary | ICD-10-CM | POA: Insufficient documentation

## 2022-06-04 DIAGNOSIS — E162 Hypoglycemia, unspecified: Secondary | ICD-10-CM | POA: Diagnosis not present

## 2022-06-04 DIAGNOSIS — Z79899 Other long term (current) drug therapy: Secondary | ICD-10-CM | POA: Diagnosis not present

## 2022-06-04 DIAGNOSIS — I10 Essential (primary) hypertension: Secondary | ICD-10-CM | POA: Insufficient documentation

## 2022-06-04 DIAGNOSIS — Z794 Long term (current) use of insulin: Secondary | ICD-10-CM | POA: Insufficient documentation

## 2022-06-04 DIAGNOSIS — R6889 Other general symptoms and signs: Secondary | ICD-10-CM | POA: Diagnosis not present

## 2022-06-04 DIAGNOSIS — R000 Tachycardia, unspecified: Secondary | ICD-10-CM | POA: Insufficient documentation

## 2022-06-04 DIAGNOSIS — Z743 Need for continuous supervision: Secondary | ICD-10-CM | POA: Diagnosis not present

## 2022-06-04 DIAGNOSIS — R531 Weakness: Secondary | ICD-10-CM | POA: Diagnosis not present

## 2022-06-04 DIAGNOSIS — I499 Cardiac arrhythmia, unspecified: Secondary | ICD-10-CM | POA: Diagnosis not present

## 2022-06-04 LAB — CBC
HCT: 33 % — ABNORMAL LOW (ref 36.0–46.0)
Hemoglobin: 10.8 g/dL — ABNORMAL LOW (ref 12.0–15.0)
MCH: 30.3 pg (ref 26.0–34.0)
MCHC: 32.7 g/dL (ref 30.0–36.0)
MCV: 92.4 fL (ref 80.0–100.0)
Platelets: 250 10*3/uL (ref 150–400)
RBC: 3.57 MIL/uL — ABNORMAL LOW (ref 3.87–5.11)
RDW: 12 % (ref 11.5–15.5)
WBC: 9.6 10*3/uL (ref 4.0–10.5)
nRBC: 0 % (ref 0.0–0.2)

## 2022-06-04 LAB — CBG MONITORING, ED
Glucose-Capillary: 228 mg/dL — ABNORMAL HIGH (ref 70–99)
Glucose-Capillary: 60 mg/dL — ABNORMAL LOW (ref 70–99)
Glucose-Capillary: 129 mg/dL — ABNORMAL HIGH (ref 70–99)

## 2022-06-04 LAB — COMPREHENSIVE METABOLIC PANEL
ALT: 13 U/L (ref 0–44)
AST: 25 U/L (ref 15–41)
Albumin: 3.5 g/dL (ref 3.5–5.0)
Alkaline Phosphatase: 80 U/L (ref 38–126)
Anion gap: 13 (ref 5–15)
BUN: 18 mg/dL (ref 8–23)
CO2: 25 mmol/L (ref 22–32)
Calcium: 8.1 mg/dL — ABNORMAL LOW (ref 8.9–10.3)
Chloride: 97 mmol/L — ABNORMAL LOW (ref 98–111)
Creatinine, Ser: 1.28 mg/dL — ABNORMAL HIGH (ref 0.44–1.00)
GFR, Estimated: 43 mL/min — ABNORMAL LOW (ref >60)
Glucose, Bld: 336 mg/dL — ABNORMAL HIGH (ref 70–99)
Potassium: 3.8 mmol/L (ref 3.5–5.1)
Sodium: 135 mmol/L (ref 135–145)
Total Bilirubin: 1 mg/dL (ref 0.3–1.2)
Total Protein: 7.4 g/dL (ref 6.5–8.1)

## 2022-06-04 MED ORDER — SODIUM CHLORIDE 0.9% FLUSH
3.0000 mL | Freq: Two times a day (BID) | INTRAVENOUS | Status: DC
  Administered 2022-06-04: 3 mL via INTRAVENOUS

## 2022-06-04 MED ORDER — SODIUM CHLORIDE 0.9 % IV SOLN: 250.0000 mL | INTRAVENOUS | Status: DC | PRN

## 2022-06-04 MED ORDER — DEXTROSE 50 % IV SOLN: 1.0000 | Freq: Once | INTRAVENOUS | Status: AC

## 2022-06-04 MED ORDER — SODIUM CHLORIDE 0.9% FLUSH: 3.0000 mL | INTRAVENOUS | Status: DC | PRN

## 2022-06-04 MED ORDER — DOXYCYCLINE HYCLATE 100 MG PO CAPS: 100.0000 mg | ORAL_CAPSULE | Freq: Two times a day (BID) | ORAL | 0 refills | Status: DC

## 2022-06-04 MED ORDER — DOXYCYCLINE HYCLATE 100 MG PO TABS
100.0000 mg | ORAL_TABLET | Freq: Once | ORAL | Status: AC
  Administered 2022-06-04: 100 mg via ORAL
  Filled 2022-06-04: qty 1

## 2022-06-04 MED ORDER — IOHEXOL 350 MG/ML SOLN
50.0000 mL | Freq: Once | INTRAVENOUS | Status: AC | PRN
  Administered 2022-06-04: 50 mL via INTRAVENOUS

## 2022-06-04 MED ORDER — DEXTROSE 50 % IV SOLN
INTRAVENOUS | Status: AC
  Administered 2022-06-04: 50 mL via INTRAVENOUS
  Filled 2022-06-04: qty 50

## 2022-06-04 NOTE — ED Provider Notes (Signed)
[facility] AT [facility] Provider Note   CSN: [id number] Arrival date & time: 06/04/22  1242     History  Chief Complaint  Patient presents with   Hypoglycemia    Nichole Munoz is a 77 y.o. female.  HPI 5 caveat secondary to hypoglycemia and amnestic for event 77 year old female presents today with hypoglycemia.  Patient reports that they were dispatched to cardiac arrest.  Upon their arrival they found patient unresponsive with GCS of 3, CBG was 40.  Patient received prehospital D10 and became more awake and route.  Patient was hypertensive on initial evaluation with normal heart rate and pulses.     Home Medications Prior to Admission medications   Medication Sig Start Date End Date Taking? Authorizing Provider  acetaminophen (TYLENOL) 500 MG tablet Take 1,000 mg by mouth every 6 (six) hours as needed for mild pain.    [provider]  albuterol (PROVENTIL HFA;VENTOLIN HFA) 108 (90 Base) MCG/ACT inhaler Inhale 2 puffs into the lungs every 6 (six) hours as needed for wheezing or shortness of breath. 02/21/18   Rhetta Mura, MD  blood glucose meter kit and supplies Dispense based on patient and insurance preference. Use up to four times daily as directed. (FOR ICD-10 E10.9, E11.9). 01/26/17   Noralee Stain, DO  diclofenac Sodium (VOLTAREN) 1 % GEL Apply 2 g topically 4 (four) times daily. Patient taking differently: Apply 2 g topically 4 (four) times daily as needed (pain). 02/19/19   Khatri, Hina, PA-C  feeding supplement (ENSURE ENLIVE / ENSURE PLUS) LIQD Take 237 mLs by mouth 2 (two) times daily between meals. 07/28/21   Lorin Glass, MD  gabapentin (NEURONTIN) 400 MG capsule Take 400 mg by mouth 3 (three) times daily. 06/19/21   [provider]  insulin aspart (NOVOLOG) 100 UNIT/ML FlexPen Inject 1-9 Units into the skin 4 (four) times daily -  before meals and at bedtime. Glucose 121 - 150: 1 unit, Glucose 151 - 200: 2 units,  Glucose 201 - 250: 3 units, Glucose 251 - 300: 5 units, Glucose 301 - 350: 7 units, Glucose 351 - 400: 9 units, Glucose > 400 call MD 06/04/21   Lewie Chamber, MD  insulin glargine (LANTUS) 100 UNIT/ML injection Inject 0.2 mLs (20 Units total) into the skin 2 (two) times daily. 07/28/21 10/26/21  Lorin Glass, MD  Insulin Pen Needle 31G X 5 MM MISC Use up to 4 times daily with insulin 01/26/17   Noralee Stain, DO  lisinopril (ZESTRIL) 10 MG tablet Take 10 mg by mouth daily. 07/31/19   [provider]  OLANZapine (ZYPREXA) 10 MG tablet Take 1 tablet (10 mg total) by mouth at bedtime. 07/28/21   Lorin Glass, MD  rosuvastatin (CRESTOR) 40 MG tablet Take 40 mg by mouth daily. 04/29/21   [provider]  SYMBICORT 160-4.5 MCG/ACT inhaler Inhale 2 puffs into the lungs 2 (two) times daily as needed (shortness of breath). 05/26/21   [provider]      Allergies    Other, Chicken allergy, and No known allergies    Review of Systems   Review of Systems  Physical Exam Updated Vital Signs BP (!) 156/100   Pulse 88   Temp 97.8 F (36.6 C) (Oral)   Resp 16   SpO2 99%  Physical Exam Vitals and nursing note reviewed.  Constitutional:      General: She is not in acute distress.    Appearance: She is normal weight. She is  ill-appearing.  HENT:     Head: Normocephalic.     Right Ear: External ear normal.     Left Ear: External ear normal.     Nose: Nose normal.     Mouth/Throat:     Pharynx: Oropharynx is clear.  Eyes:     Extraocular Movements: Extraocular movements intact.     Pupils: Pupils are equal, round, and reactive to light.  Cardiovascular:     Rate and Rhythm: Normal rate and regular rhythm.     Pulses: Normal pulses.  Pulmonary:     Effort: Pulmonary effort is normal.  Abdominal:     General: Abdomen is flat. Bowel sounds are normal.     Palpations: Abdomen is soft.  Musculoskeletal:     Cervical back: Normal range of motion.  Skin:    General:  Skin is warm and dry.     Capillary Refill: Capillary refill takes less than 2 seconds.  Neurological:     General: No focal deficit present.     Mental Status: She is alert.     Comments: Patient with some choreiform movement of arms  Psychiatric:        Mood and Affect: Mood normal.     ED Results / Procedures / Treatments   Labs (all labs ordered are listed, but only abnormal results are displayed) Labs Reviewed  CBC - Abnormal; Notable for the following components:      Result Value   RBC 3.57 (*)    Hemoglobin 10.8 (*)    HCT 33.0 (*)    All other components within normal limits  COMPREHENSIVE METABOLIC PANEL - Abnormal; Notable for the following components:   Chloride 97 (*)    Glucose, Bld 336 (*)    Creatinine, Ser 1.28 (*)    Calcium 8.1 (*)    GFR, Estimated 43 (*)    All other components within normal limits  CBG MONITORING, ED - Abnormal; Notable for the following components:   Glucose-Capillary 60 (*)    All other components within normal limits  CBG MONITORING, ED - Abnormal; Notable for the following components:   Glucose-Capillary 228 (*)    All other components within normal limits  CBG MONITORING, ED - Abnormal; Notable for the following components:   Glucose-Capillary 129 (*)    All other components within normal limits  CBG MONITORING, ED  CBG MONITORING, ED    EKG EKG Interpretation  Date/Time:  Wednesday Jun 04 2022 12:56:08 EDT Ventricular Rate:  106 PR Interval:  153 QRS Duration: 100 QT Interval:  407 QTC Calculation: 533 R Axis:   -12 Text Interpretation: Sinus tachycardia Paired ventricular premature complexes Aberrant conduction of SV complex(es) Borderline T abnormalities, lateral leads Prolonged QT interval Confirmed by Margarita Grizzle [phone]) on 06/04/2022 3:27:35 PM  Radiology DG Chest Port 1 View  Result Date: 06/04/2022 CLINICAL DATA:  Weakness EXAM: PORTABLE CHEST 1 VIEW COMPARISON:  None Available. FINDINGS: Cardiomegaly.  Masslike appearance of the right hilum. Both lungs are clear. The visualized skeletal structures are unremarkable. IMPRESSION: 1. Masslike appearance of the right hilum, concerning for adenopathy or mass. Recommend CT to further evaluate. 2. No acute abnormality of the lungs. 3. Cardiomegaly. Electronically Signed   By: Jearld Lesch M.D.   On: 06/04/2022 13:51    Procedures .Critical Care  Performed by: Margarita Grizzle, MD Authorized by: Margarita Grizzle, MD   Critical care provider statement:    Critical care time (minutes):  30   Critical care was  necessary to treat or prevent imminent or life-threatening deterioration of the following conditions:  CNS failure or compromise   Critical care was time spent personally by me on the following activities:  Development of treatment plan with patient or surrogate, discussions with consultants, evaluation of patient's response to treatment, examination of patient, ordering and review of laboratory studies, ordering and review of radiographic studies, ordering and performing treatments and interventions, pulse oximetry, re-evaluation of patient's condition and review of old charts     Medications Ordered in ED Medications  sodium chloride flush (NS) 0.9 % injection 3 mL (3 mLs Intravenous Given 06/04/22 1435)  sodium chloride flush (NS) 0.9 % injection 3 mL (has no administration in time range)  0.9 %  sodium chloride infusion (has no administration in time range)  dextrose 50 % solution 50 mL (50 mLs Intravenous Given 06/04/22 1330)    ED Course/ Medical Decision Making/ A&P Clinical Course as of 06/04/22 1540  Wed Jun 04, 2022  1423 CBG monitoring, ED(!) [GD]  1525 ECG reviewed interpreted and significant for hemoglobin of 10.8 stable from first prior Complete metabolic panel reviewed and interpreted significant for glucose elevated at 336 and creatinine at 1.28 Blood sugar was obtained after receiving 1 amp of D50 Kidney function is stable [DR]   1525 's x-Devarion Mcclanahan obtained with report of masslike appearance of right hilum concerning for adenopathy or mass and recommend CT to further evaluate CT was ordered [DR]    Clinical Course User Index [DR] Margarita Grizzle, MD [GD] Tillman Abide                             Medical Decision Making Amount and/or Complexity of Data Reviewed Labs: ordered. Radiology: ordered.  Risk Prescription drug management.   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 Differential diagnosis includes but is not limited to acute intracranial abnormality, hypoglycemia, other metabolic, electrolyte abnormalities, severe infection, stroke Patient is amnestic for event.  We have been unable to locate family members. Patient's blood sugar has normalized. Chest x-Pearl Bents was obtained and there is question of mass CT of head and chest ordered Will allow patient to take p.o. Discussed with Dr. Particia Nearing who will reassess after ct and po        Final Clinical Impression(s) / ED Diagnoses Final diagnoses:  Hypoglycemia    Rx / DC Orders ED Discharge Orders     None         Margarita Grizzle, MD 06/04/22 1540

## 2022-06-04 NOTE — ED Provider Notes (Signed)
Pt signed out by Dr. Rosalia Hammers pending a CT Head and CT chest.  CT head: No acute intracranial findings are seen. Atrophy. Small vessel  disease.   CT chest:  There is patchy infiltrate in posteromedial right lower lung fields  suggesting pneumonia in right lower lobe. Prominence of right hilum  seen on the chest radiographs maybe related to retro hilar  infiltrate, possibly pneumonia. There is no significant noncalcified  lymphadenopathy in right hilum.    Small patchy infiltrates are seen in right upper lobe suggesting  pneumonia. Follow-up CT in 3 months after antibiotic treatment may  be considered to assess resolution.    There is no significant lymphadenopathy in mediastinum and hilar  regions.    Aortic atherosclerosis. Coronary artery calcifications are seen.  Gallbladder stones. There is prominence of pancreatic duct without  focal abnormalities in the pancreas. Scattered diverticula seen in  colon.   Pt is back to her normal.  She is told to hold her insulin tonight and tomorrow.  She is to eat small, frequent meals.  Pt started on doxy.  Her son is her to get her and she is stable for d/c.  Return if worse.  She needs to f/u with pcp.    Jacalyn Lefevre, MD 06/04/22 [phone]

## 2022-06-04 NOTE — ED Triage Notes (Signed)
Pt BIB [facility] from home d/t hypoglycemia. EMS initially dispatched d/t a cardiac arrest, upon EMS arrival , pt was unresponsive w GCS of 3. After pt on the ambulance, pt started coming around. Cbg was 40. Hx diabetes.   10g D10 given by EMS.

## 2022-06-04 NOTE — Discharge Instructions (Addendum)
Hold your insulin tonight and tomorrow.  Check your blood sugars and keep a log of them.  Eat small, frequent meals.

## 2022-07-03 DIAGNOSIS — R259 Unspecified abnormal involuntary movements: Secondary | ICD-10-CM | POA: Diagnosis not present

## 2022-07-03 DIAGNOSIS — J4530 Mild persistent asthma, uncomplicated: Secondary | ICD-10-CM | POA: Diagnosis not present

## 2022-07-03 DIAGNOSIS — E782 Mixed hyperlipidemia: Secondary | ICD-10-CM | POA: Diagnosis not present

## 2022-07-03 DIAGNOSIS — R269 Unspecified abnormalities of gait and mobility: Secondary | ICD-10-CM | POA: Diagnosis not present

## 2022-07-03 DIAGNOSIS — Z8673 Personal history of transient ischemic attack (TIA), and cerebral infarction without residual deficits: Secondary | ICD-10-CM | POA: Diagnosis not present

## 2022-07-03 DIAGNOSIS — E1165 Type 2 diabetes mellitus with hyperglycemia: Secondary | ICD-10-CM | POA: Diagnosis not present

## 2022-07-03 DIAGNOSIS — E1142 Type 2 diabetes mellitus with diabetic polyneuropathy: Secondary | ICD-10-CM | POA: Diagnosis not present

## 2022-07-03 DIAGNOSIS — M47816 Spondylosis without myelopathy or radiculopathy, lumbar region: Secondary | ICD-10-CM | POA: Diagnosis not present

## 2022-07-03 DIAGNOSIS — I10 Essential (primary) hypertension: Secondary | ICD-10-CM | POA: Diagnosis not present

## 2022-07-03 DIAGNOSIS — N1832 Chronic kidney disease, stage 3b: Secondary | ICD-10-CM | POA: Diagnosis not present

## 2022-07-03 DIAGNOSIS — Z794 Long term (current) use of insulin: Secondary | ICD-10-CM | POA: Diagnosis not present

## 2022-07-15 DIAGNOSIS — R54 Age-related physical debility: Secondary | ICD-10-CM | POA: Diagnosis not present

## 2022-07-15 DIAGNOSIS — Z7409 Other reduced mobility: Secondary | ICD-10-CM | POA: Diagnosis not present

## 2022-09-05 DIAGNOSIS — M47816 Spondylosis without myelopathy or radiculopathy, lumbar region: Secondary | ICD-10-CM | POA: Diagnosis not present

## 2022-09-05 DIAGNOSIS — E782 Mixed hyperlipidemia: Secondary | ICD-10-CM | POA: Diagnosis not present

## 2022-09-05 DIAGNOSIS — N1832 Chronic kidney disease, stage 3b: Secondary | ICD-10-CM | POA: Diagnosis not present

## 2022-09-05 DIAGNOSIS — R269 Unspecified abnormalities of gait and mobility: Secondary | ICD-10-CM | POA: Diagnosis not present

## 2022-09-05 DIAGNOSIS — J4530 Mild persistent asthma, uncomplicated: Secondary | ICD-10-CM | POA: Diagnosis not present

## 2022-09-05 DIAGNOSIS — E1142 Type 2 diabetes mellitus with diabetic polyneuropathy: Secondary | ICD-10-CM | POA: Diagnosis not present

## 2022-09-05 DIAGNOSIS — Z8673 Personal history of transient ischemic attack (TIA), and cerebral infarction without residual deficits: Secondary | ICD-10-CM | POA: Diagnosis not present

## 2022-09-05 DIAGNOSIS — Z794 Long term (current) use of insulin: Secondary | ICD-10-CM | POA: Diagnosis not present

## 2022-09-05 DIAGNOSIS — I10 Essential (primary) hypertension: Secondary | ICD-10-CM | POA: Diagnosis not present

## 2023-01-11 ENCOUNTER — Emergency Department (HOSPITAL_COMMUNITY): Payer: 59

## 2023-01-11 ENCOUNTER — Encounter (HOSPITAL_COMMUNITY): Payer: Self-pay | Admitting: *Deleted

## 2023-01-11 ENCOUNTER — Observation Stay (HOSPITAL_COMMUNITY)
Admission: EM | Admit: 2023-01-11 | Discharge: 2023-01-14 | Disposition: A | Discharge status: Home / Self Care | Payer: 59 | Attending: Internal Medicine | Admitting: Internal Medicine

## 2023-01-11 ENCOUNTER — Other Ambulatory Visit: Payer: Self-pay

## 2023-01-11 ENCOUNTER — Observation Stay (HOSPITAL_BASED_OUTPATIENT_CLINIC_OR_DEPARTMENT_OTHER): Payer: 59

## 2023-01-11 DIAGNOSIS — R0602 Shortness of breath: Secondary | ICD-10-CM | POA: Insufficient documentation

## 2023-01-11 DIAGNOSIS — E785 Hyperlipidemia, unspecified: Secondary | ICD-10-CM | POA: Diagnosis present

## 2023-01-11 DIAGNOSIS — Z7901 Long term (current) use of anticoagulants: Secondary | ICD-10-CM | POA: Diagnosis not present

## 2023-01-11 DIAGNOSIS — R2681 Unsteadiness on feet: Secondary | ICD-10-CM | POA: Diagnosis not present

## 2023-01-11 DIAGNOSIS — R55 Syncope and collapse: Secondary | ICD-10-CM | POA: Diagnosis not present

## 2023-01-11 DIAGNOSIS — J8410 Pulmonary fibrosis, unspecified: Secondary | ICD-10-CM | POA: Diagnosis not present

## 2023-01-11 DIAGNOSIS — Z20822 Contact with and (suspected) exposure to covid-19: Secondary | ICD-10-CM | POA: Diagnosis not present

## 2023-01-11 DIAGNOSIS — M6281 Muscle weakness (generalized): Secondary | ICD-10-CM | POA: Insufficient documentation

## 2023-01-11 DIAGNOSIS — R1110 Vomiting, unspecified: Secondary | ICD-10-CM | POA: Diagnosis not present

## 2023-01-11 DIAGNOSIS — I6930 Unspecified sequelae of cerebral infarction: Secondary | ICD-10-CM

## 2023-01-11 DIAGNOSIS — I6782 Cerebral ischemia: Secondary | ICD-10-CM | POA: Diagnosis not present

## 2023-01-11 DIAGNOSIS — I160 Hypertensive urgency: Secondary | ICD-10-CM | POA: Diagnosis not present

## 2023-01-11 DIAGNOSIS — R092 Respiratory arrest: Secondary | ICD-10-CM | POA: Diagnosis not present

## 2023-01-11 DIAGNOSIS — E1165 Type 2 diabetes mellitus with hyperglycemia: Secondary | ICD-10-CM

## 2023-01-11 DIAGNOSIS — Z8673 Personal history of transient ischemic attack (TIA), and cerebral infarction without residual deficits: Secondary | ICD-10-CM | POA: Insufficient documentation

## 2023-01-11 DIAGNOSIS — I129 Hypertensive chronic kidney disease with stage 1 through stage 4 chronic kidney disease, or unspecified chronic kidney disease: Secondary | ICD-10-CM | POA: Insufficient documentation

## 2023-01-11 DIAGNOSIS — Z743 Need for continuous supervision: Secondary | ICD-10-CM | POA: Diagnosis not present

## 2023-01-11 DIAGNOSIS — D500 Iron deficiency anemia secondary to blood loss (chronic): Secondary | ICD-10-CM | POA: Diagnosis not present

## 2023-01-11 DIAGNOSIS — R2689 Other abnormalities of gait and mobility: Secondary | ICD-10-CM | POA: Diagnosis not present

## 2023-01-11 DIAGNOSIS — Z794 Long term (current) use of insulin: Secondary | ICD-10-CM | POA: Diagnosis not present

## 2023-01-11 DIAGNOSIS — N1831 Chronic kidney disease, stage 3a: Secondary | ICD-10-CM | POA: Diagnosis not present

## 2023-01-11 DIAGNOSIS — E1122 Type 2 diabetes mellitus with diabetic chronic kidney disease: Secondary | ICD-10-CM | POA: Insufficient documentation

## 2023-01-11 DIAGNOSIS — K8020 Calculus of gallbladder without cholecystitis without obstruction: Secondary | ICD-10-CM | POA: Diagnosis not present

## 2023-01-11 DIAGNOSIS — I700 Atherosclerosis of aorta: Secondary | ICD-10-CM | POA: Diagnosis not present

## 2023-01-11 DIAGNOSIS — R6889 Other general symptoms and signs: Secondary | ICD-10-CM | POA: Diagnosis not present

## 2023-01-11 DIAGNOSIS — I499 Cardiac arrhythmia, unspecified: Secondary | ICD-10-CM | POA: Diagnosis not present

## 2023-01-11 DIAGNOSIS — Z79899 Other long term (current) drug therapy: Secondary | ICD-10-CM | POA: Insufficient documentation

## 2023-01-11 DIAGNOSIS — R404 Transient alteration of awareness: Secondary | ICD-10-CM | POA: Diagnosis not present

## 2023-01-11 LAB — ECHOCARDIOGRAM COMPLETE
AR max vel: 1.36 cm2
AV Peak grad: 8.6 mm[Hg]
Ao pk vel: 1.47 m/s
Area-P 1/2: 5.13 cm2
Height: 60 in
P 1/2 time: 1033 ms
S' Lateral: 2.7 cm
Weight: 2000 [oz_av]

## 2023-01-11 LAB — URINALYSIS, ROUTINE W REFLEX MICROSCOPIC
Bacteria, UA: NONE SEEN
Bilirubin Urine: NEGATIVE
Glucose, UA: NEGATIVE mg/dL
Hgb urine dipstick: NEGATIVE
Ketones, ur: NEGATIVE mg/dL
Leukocytes,Ua: NEGATIVE
Nitrite: NEGATIVE
Protein, ur: 100 mg/dL — AB
Specific Gravity, Urine: 1.014 (ref 1.005–1.030)
pH: 7 (ref 5.0–8.0)

## 2023-01-11 LAB — CBC
HCT: 36.6 % (ref 36.0–46.0)
Hemoglobin: 12.4 g/dL (ref 12.0–15.0)
MCH: 29.5 pg (ref 26.0–34.0)
MCHC: 33.9 g/dL (ref 30.0–36.0)
MCV: 86.9 fL (ref 80.0–100.0)
Platelets: 199 10*3/uL (ref 150–400)
RBC: 4.21 MIL/uL (ref 3.87–5.11)
RDW: 11.5 % (ref 11.5–15.5)
WBC: 7.7 10*3/uL (ref 4.0–10.5)
nRBC: 0 % (ref 0.0–0.2)

## 2023-01-11 LAB — CBG MONITORING, ED
Glucose-Capillary: 158 mg/dL — ABNORMAL HIGH (ref 70–99)
Glucose-Capillary: 93 mg/dL (ref 70–99)
Glucose-Capillary: 244 mg/dL — ABNORMAL HIGH (ref 70–99)

## 2023-01-11 LAB — COMPREHENSIVE METABOLIC PANEL
ALT: 11 U/L (ref 0–44)
AST: 19 U/L (ref 15–41)
Albumin: 3.6 g/dL (ref 3.5–5.0)
Alkaline Phosphatase: 61 U/L (ref 38–126)
Anion gap: 11 (ref 5–15)
BUN: 25 mg/dL — ABNORMAL HIGH (ref 8–23)
CO2: 28 mmol/L (ref 22–32)
Calcium: 9.1 mg/dL (ref 8.9–10.3)
Chloride: 105 mmol/L (ref 98–111)
Creatinine, Ser: 1.36 mg/dL — ABNORMAL HIGH (ref 0.44–1.00)
GFR, Estimated: 40 mL/min — ABNORMAL LOW (ref >60)
Glucose, Bld: 176 mg/dL — ABNORMAL HIGH (ref 70–99)
Potassium: 3.8 mmol/L (ref 3.5–5.1)
Sodium: 144 mmol/L (ref 135–145)
Total Bilirubin: 0.6 mg/dL (ref 0.0–1.2)
Total Protein: 7.5 g/dL (ref 6.5–8.1)

## 2023-01-11 LAB — TSH: TSH: 1.069 u[IU]/mL (ref 0.350–4.500)

## 2023-01-11 LAB — RESP PANEL BY RT-PCR (RSV, FLU A&B, COVID)  RVPGX2
Influenza A by PCR: NEGATIVE
Influenza B by PCR: NEGATIVE
Resp Syncytial Virus by PCR: NEGATIVE
SARS Coronavirus 2 by RT PCR: NEGATIVE

## 2023-01-11 LAB — TROPONIN I (HIGH SENSITIVITY)
Troponin I (High Sensitivity): 14 ng/L (ref <18)
Troponin I (High Sensitivity): 13 ng/L (ref <18)

## 2023-01-11 LAB — D-DIMER, QUANTITATIVE: D-Dimer, Quant: 1.22 ug{FEU}/mL — ABNORMAL HIGH (ref 0.00–0.50)

## 2023-01-11 LAB — MAGNESIUM: Magnesium: 1.1 mg/dL — ABNORMAL LOW (ref 1.7–2.4)

## 2023-01-11 MED ORDER — IOHEXOL 350 MG/ML SOLN
60.0000 mL | Freq: Once | INTRAVENOUS | Status: AC | PRN
  Administered 2023-01-11: 60 mL via INTRAVENOUS

## 2023-01-11 MED ORDER — AMLODIPINE BESYLATE 5 MG PO TABS
5.0000 mg | ORAL_TABLET | Freq: Once | ORAL | Status: AC
  Administered 2023-01-11: 5 mg via ORAL
  Filled 2023-01-11: qty 1

## 2023-01-11 MED ORDER — ROSUVASTATIN CALCIUM 20 MG PO TABS
40.0000 mg | ORAL_TABLET | Freq: Every day | ORAL | Status: DC
  Administered 2023-01-11 – 2023-01-14 (×4): 40 mg via ORAL
  Filled 2023-01-11 (×4): qty 2

## 2023-01-11 MED ORDER — ENOXAPARIN SODIUM 30 MG/0.3ML IJ SOSY: 30.0000 mg | PREFILLED_SYRINGE | INTRAMUSCULAR | Status: DC

## 2023-01-11 MED ORDER — LISINOPRIL 10 MG PO TABS
10.0000 mg | ORAL_TABLET | Freq: Every day | ORAL | Status: DC
  Administered 2023-01-11: 10 mg via ORAL
  Filled 2023-01-11: qty 1

## 2023-01-11 MED ORDER — ACETAMINOPHEN 650 MG RE SUPP
650.0000 mg | Freq: Four times a day (QID) | RECTAL | Status: DC | PRN
Start: 2023-01-11 — End: 2023-01-14

## 2023-01-11 MED ORDER — SODIUM CHLORIDE 0.9% FLUSH
3.0000 mL | Freq: Two times a day (BID) | INTRAVENOUS | Status: DC
  Administered 2023-01-11 – 2023-01-13 (×6): 3 mL via INTRAVENOUS

## 2023-01-11 MED ORDER — ACETAMINOPHEN 325 MG PO TABS
650.0000 mg | ORAL_TABLET | Freq: Four times a day (QID) | ORAL | Status: DC | PRN
  Administered 2023-01-14: 650 mg via ORAL
  Filled 2023-01-11: qty 2

## 2023-01-11 MED ORDER — MIRTAZAPINE 15 MG PO TABS
30.0000 mg | ORAL_TABLET | Freq: Every day | ORAL | Status: DC
  Administered 2023-01-11 – 2023-01-13 (×3): 30 mg via ORAL
  Filled 2023-01-11 (×3): qty 2

## 2023-01-11 MED ORDER — GABAPENTIN 400 MG PO CAPS
400.0000 mg | ORAL_CAPSULE | Freq: Three times a day (TID) | ORAL | Status: DC
  Administered 2023-01-11 – 2023-01-14 (×8): 400 mg via ORAL
  Filled 2023-01-11 (×9): qty 1

## 2023-01-11 MED ORDER — HYDRALAZINE HCL 20 MG/ML IJ SOLN: 10.0000 mg | INTRAMUSCULAR | Status: DC | PRN

## 2023-01-11 MED ORDER — AMLODIPINE BESYLATE 5 MG PO TABS: 5.0000 mg | ORAL_TABLET | Freq: Every day | ORAL | Status: DC

## 2023-01-11 MED ORDER — INSULIN GLARGINE-YFGN 100 UNIT/ML ~~LOC~~ SOLN
10.0000 [IU] | Freq: Two times a day (BID) | SUBCUTANEOUS | Status: DC
  Administered 2023-01-11 – 2023-01-12 (×3): 10 [IU] via SUBCUTANEOUS
  Filled 2023-01-11 (×5): qty 0.1

## 2023-01-11 MED ORDER — ASPIRIN 81 MG PO CHEW
324.0000 mg | CHEWABLE_TABLET | Freq: Once | ORAL | Status: AC
  Administered 2023-01-11: 324 mg via ORAL
  Filled 2023-01-11: qty 4

## 2023-01-11 MED ORDER — ROSUVASTATIN CALCIUM 20 MG PO TABS
40.0000 mg | ORAL_TABLET | Freq: Every day | ORAL | Status: DC
  Filled 2023-01-11: qty 2

## 2023-01-11 MED ORDER — DICLOFENAC SODIUM 1 % EX GEL: 2.0000 g | Freq: Four times a day (QID) | CUTANEOUS | Status: DC | PRN

## 2023-01-11 MED ORDER — INSULIN ASPART 100 UNIT/ML IJ SOLN
0.0000 [IU] | Freq: Three times a day (TID) | INTRAMUSCULAR | Status: DC
  Administered 2023-01-12: 2 [IU] via SUBCUTANEOUS

## 2023-01-11 MED ORDER — ENOXAPARIN SODIUM 30 MG/0.3ML IJ SOSY
30.0000 mg | PREFILLED_SYRINGE | INTRAMUSCULAR | Status: DC
  Administered 2023-01-11 – 2023-01-13 (×3): 30 mg via SUBCUTANEOUS
  Filled 2023-01-11 (×3): qty 0.3
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
## 2023-01-11 NOTE — ED Notes (Signed)
Lab at BS 

## 2023-01-11 NOTE — Progress Notes (Signed)
 Echocardiogram 2D Echocardiogram has been performed.  Nichole Munoz 01/11/2023, 5:55 PM

## 2023-01-11 NOTE — ED Notes (Signed)
Pt in imaging/ radiology.

## 2023-01-11 NOTE — ED Provider Notes (Addendum)
 [place] EMERGENCY DEPARTMENT AT [facility] Provider Note   CSN: [id number] Arrival date & time: 01/11/23  9293    Translator used History  Chief Complaint  Patient presents with   Near Syncope    Jenah Vanasten is a 78 y.o. female.   Near Syncope     Pt remembers watching family eat.  She started feeling weaker and weaker and then thinks she may have passed out.  She was also feeling short of breath.  She remembers people talking to her but was not able to respond. She is feeling better now.  She denies chest pain or headache, no  abdominal pain but she does still feel short of breath.  No fevers or cough recently.  Home Medications Prior to Admission medications   Medication Sig Start Date End Date Taking? Authorizing Provider  acetaminophen  (TYLENOL ) 500 MG tablet Take 1,000 mg by mouth every 6 (six) hours as needed for mild pain.    [provider]  albuterol  (PROVENTIL  HFA;VENTOLIN  HFA) 108 (90 Base) MCG/ACT inhaler Inhale 2 puffs into the lungs every 6 (six) hours as needed for wheezing or shortness of breath. 02/21/18   Samtani, Jai-Gurmukh, MD  blood glucose meter kit and supplies Dispense based on patient and insurance preference. Use up to four times daily as directed. (FOR ICD-10 E10.9, E11.9). 01/26/17   Rojelio Nest, DO  diclofenac  Sodium (VOLTAREN ) 1 % GEL Apply 2 g topically 4 (four) times daily. Patient taking differently: Apply 2 g topically 4 (four) times daily as needed (pain). 02/19/19   Khatri, Hina, PA-C  doxycycline  (VIBRAMYCIN ) 100 MG capsule Take 1 capsule (100 mg total) by mouth 2 (two) times daily. 06/04/22   Dean Clarity, MD  feeding supplement (ENSURE ENLIVE / ENSURE PLUS) LIQD Take 237 mLs by mouth 2 (two) times daily between meals. 07/28/21   Arlice Reichert, MD  gabapentin  (NEURONTIN ) 400 MG capsule Take 400 mg by mouth 3 (three) times daily. 06/19/21   [provider]  insulin  aspart (NOVOLOG ) 100 UNIT/ML FlexPen Inject 1-9  Units into the skin 4 (four) times daily -  before meals and at bedtime. Glucose 121 - 150: 1 unit, Glucose 151 - 200: 2 units, Glucose 201 - 250: 3 units, Glucose 251 - 300: 5 units, Glucose 301 - 350: 7 units, Glucose 351 - 400: 9 units, Glucose > 400 call MD 06/04/21   Patsy Lenis, MD  insulin  glargine (LANTUS ) 100 UNIT/ML injection Inject 0.2 mLs (20 Units total) into the skin 2 (two) times daily. 07/28/21 10/26/21  Arlice Reichert, MD  Insulin  Pen Needle 31G X 5 MM MISC Use up to 4 times daily with insulin  01/26/17   Rojelio Nest, DO  lisinopril  (ZESTRIL ) 10 MG tablet Take 10 mg by mouth daily. 07/31/19   [provider]  OLANZapine  (ZYPREXA ) 10 MG tablet Take 1 tablet (10 mg total) by mouth at bedtime. 07/28/21   Arlice Reichert, MD  rosuvastatin  (CRESTOR ) 40 MG tablet Take 40 mg by mouth daily. 04/29/21   [provider]  SYMBICORT 160-4.5 MCG/ACT inhaler Inhale 2 puffs into the lungs 2 (two) times daily as needed (shortness of breath). 05/26/21   [provider]      Allergies    Other, Chicken allergy, and No known allergies    Review of Systems   Review of Systems  Cardiovascular:  Positive for near-syncope.    Physical Exam Updated Vital Signs BP (!) 192/66   Pulse 87   Temp 99.8  F (37.7 C) (Oral)   Resp 17   Wt 56.7 kg   SpO2 100%   BMI 24.41 kg/m  Physical Exam Vitals and nursing note reviewed.  Constitutional:      Appearance: She is well-developed. She is not diaphoretic.  HENT:     Head: Normocephalic and atraumatic.     Right Ear: External ear normal.     Left Ear: External ear normal.  Eyes:     General: No scleral icterus.       Right eye: No discharge.        Left eye: No discharge.     Conjunctiva/sclera: Conjunctivae normal.  Neck:     Trachea: No tracheal deviation.  Cardiovascular:     Rate and Rhythm: Normal rate and regular rhythm.  Pulmonary:     Effort: Pulmonary effort is normal. No respiratory distress.     Breath  sounds: Normal breath sounds. No stridor. No wheezing or rales.  Abdominal:     General: Bowel sounds are normal. There is no distension.     Palpations: Abdomen is soft.     Tenderness: There is no abdominal tenderness. There is no guarding or rebound.  Musculoskeletal:        General: No tenderness or deformity.     Cervical back: Neck supple.  Skin:    General: Skin is warm and dry.     Findings: No rash.  Neurological:     General: No focal deficit present.     Mental Status: She is alert.     Cranial Nerves: No cranial nerve deficit, dysarthria or facial asymmetry.     Sensory: No sensory deficit.     Motor: No abnormal muscle tone or seizure activity.     Coordination: Coordination normal.     Comments: Able to lift both arms and legs off the bed but weaker on left, repetitive sporadic movements right upprem extremity, facial grimacing,   Psychiatric:        Mood and Affect: Mood normal.     ED Results / Procedures / Treatments   Labs (all labs ordered are listed, but only abnormal results are displayed) Labs Reviewed  D-DIMER, QUANTITATIVE - Abnormal; Notable for the following components:      Result Value   D-Dimer, Quant 1.22 (*)    All other components within normal limits  COMPREHENSIVE METABOLIC PANEL - Abnormal; Notable for the following components:   Glucose, Bld 176 (*)    BUN 25 (*)    Creatinine, Ser 1.36 (*)    GFR, Estimated 40 (*)    All other components within normal limits  URINALYSIS, ROUTINE W REFLEX MICROSCOPIC - Abnormal; Notable for the following components:   Color, Urine STRAW (*)    Protein, ur 100 (*)    All other components within normal limits  CBG MONITORING, ED - Abnormal; Notable for the following components:   Glucose-Capillary 158 (*)    All other components within normal limits  RESP PANEL BY RT-PCR (RSV, FLU A&B, COVID)  RVPGX2  CBC  BRAIN NATRIURETIC PEPTIDE  TROPONIN I (HIGH SENSITIVITY)  TROPONIN I (HIGH SENSITIVITY)     EKG None  Radiology CT Angio Chest PE W and/or Wo Contrast Result Date: 01/11/2023 CLINICAL DATA:  Pulmonary embolism suspected, low to intermediate probability with positive D-dimer. EXAM: CT ANGIOGRAPHY CHEST WITH CONTRAST TECHNIQUE: Multidetector CT imaging of the chest was performed using the standard protocol during bolus administration of intravenous contrast. Multiplanar CT image reconstructions and  MIPs were obtained to evaluate the vascular anatomy. RADIATION DOSE REDUCTION: This exam was performed according to the departmental dose-optimization program which includes automated exposure control, adjustment of the mA and/or kV according to patient size and/or use of iterative reconstruction technique. CONTRAST:  60mL OMNIPAQUE  IOHEXOL  350 MG/ML SOLN COMPARISON:  06/04/2022 FINDINGS: Cardiovascular: Satisfactory opacification of the pulmonary arteries to the segmental level. No evidence of pulmonary embolism when allowing for artifact from multilevel motion. Normal heart size. No pericardial effusion. Mediastinum/Nodes: Calcified right hilar lymph nodes, also seen on prior and attributed to remote granulomatous disease. Scattered atheromatous calcification of the aorta and coronaries. Lungs/Pleura: There is no edema, consolidation, effusion, or pneumothorax. Mild subpleural scarring in the medial right lower lobe. Upper Abdomen: Cholelithiasis. Musculoskeletal: No acute or aggressive finding Review of the MIP images confirms the above findings. IMPRESSION: 1. Negative for pulmonary embolism or other acute finding. 2. Atherosclerosis and cholelithiasis. Electronically Signed   By: Dorn Roulette M.D.   On: 01/11/2023 09:49   DG Chest Portable 1 View Result Date: 01/11/2023 CLINICAL DATA:  Vomiting. EXAM: PORTABLE CHEST 1 VIEW COMPARISON:  06/04/2022 FINDINGS: Artifact from EKG leads. Normal heart size and mediastinal contours. No acute infiltrate or edema. No effusion or pneumothorax. No acute  osseous findings. IMPRESSION: No active disease. Electronically Signed   By: Dorn Roulette M.D.   On: 01/11/2023 08:19   CT Head Wo Contrast Result Date: 01/11/2023 CLINICAL DATA:  78 year old female with history of altered mental status. Syncope. EXAM: CT HEAD WITHOUT CONTRAST TECHNIQUE: Contiguous axial images were obtained from the base of the skull through the vertex without intravenous contrast. RADIATION DOSE REDUCTION: This exam was performed according to the departmental dose-optimization program which includes automated exposure control, adjustment of the mA and/or kV according to patient size and/or use of iterative reconstruction technique. COMPARISON:  PET-CT 06/04/2022. FINDINGS: Brain: Mild cerebral atrophy. Patchy and confluent areas of decreased attenuation are noted throughout the deep and periventricular white matter of the cerebral hemispheres bilaterally, compatible with chronic microvascular ischemic disease. Cavum septum pellucidum (normal anatomical variant) incidentally noted. No evidence of acute infarction, hemorrhage, hydrocephalus, extra-axial collection or mass lesion/mass effect. Vascular: No hyperdense vessel or unexpected calcification. Skull: Normal. Negative for fracture or focal lesion. Sinuses/Orbits: No acute finding. Other: None. IMPRESSION: 1. No evidence of significant acute traumatic injury to the skull or brain. 2. Mild cerebral atrophy with chronic microvascular ischemic changes in the cerebral white matter, as above. Electronically Signed   By: Toribio Aye M.D.   On: 01/11/2023 08:02    Procedures Procedures    Medications Ordered in ED Medications  aspirin  chewable tablet 324 mg (324 mg Oral Given 01/11/23 0826)  iohexol  (OMNIPAQUE ) 350 MG/ML injection 60 mL (60 mLs Intravenous Contrast Given 01/11/23 0931)    ED Course/ Medical Decision Making/ A&P Clinical Course as of 01/11/23 1108  Sun Jan 11, 2023  0910 Comprehensive metabolic panel(!) Metabolic  panel unchanged.  CBC normal D-dimer elevated [JK]  0910 CT scan of the head without acute abnormality [JK]  0910 Chest x-ray without acute findings [JK]  1056 CT angio without signs of PE [JK]    Clinical Course User Index [JK] Randol Simmonds, MD                                 Medical Decision Making Anshul diagnosis includes but not limited to anemia, dehydration, cardiac dysrhythmia  Problems Addressed: Syncope  and collapse: acute illness or injury that poses a threat to life or bodily functions  Amount and/or Complexity of Data Reviewed Labs: ordered. Decision-making details documented in ED Course. Radiology: ordered.  Risk OTC drugs. Prescription drug management. Decision regarding hospitalization.   Presented to the ED for evaluation after a syncopal episode.  Patient recalls feeling lightheaded before the event.  She does have some memory of it but not being able to communicate.  No seizure activity reported.  Presentation more suggestive of syncope.  No cardiac dysrhythmia noted in the ED at this time.  Patient is not anemic or dehydrated.  She did have an elevated D-dimer and with her shortness of breath a CT angiogram was performed but it does not show evidence of PE pneumonia pneumothorax or other acute abnormality.  With her age and comorbidities we will plan on admission to the hospital for monitoring and further evaluation Case discussed with Dr. Claudene   Final Clinical Impression(s) / ED Diagnoses Final diagnoses:  Syncope and collapse    Rx / DC Orders ED Discharge Orders     None            Randol Simmonds, MD 01/11/23 1110

## 2023-01-11 NOTE — ED Notes (Signed)
 EDP at [facility] with [employer] video interpreter Kmer/Cambodian,

## 2023-01-11 NOTE — ED Triage Notes (Addendum)
 BIB [facility] from home, lives with family, speaks little English, speaks Cambodian, here for near syncope in kitchen after vomiting. A&O to baseline with some confusion, h/o CVA x2 with residual defecits of: L sided weakness, and R side involuntary movements. NSR 80, 170/86, 98% RA, CBG 228. Arrives alert, NAD, calm, skin W&D, resps e/u. No fall, no injury, no complaints.

## 2023-01-12 DIAGNOSIS — I160 Hypertensive urgency: Secondary | ICD-10-CM | POA: Diagnosis not present

## 2023-01-12 DIAGNOSIS — R55 Syncope and collapse: Secondary | ICD-10-CM | POA: Diagnosis not present

## 2023-01-12 DIAGNOSIS — E785 Hyperlipidemia, unspecified: Secondary | ICD-10-CM | POA: Diagnosis not present

## 2023-01-12 DIAGNOSIS — Z794 Long term (current) use of insulin: Secondary | ICD-10-CM | POA: Diagnosis not present

## 2023-01-12 DIAGNOSIS — N1831 Chronic kidney disease, stage 3a: Secondary | ICD-10-CM | POA: Diagnosis not present

## 2023-01-12 DIAGNOSIS — I6930 Unspecified sequelae of cerebral infarction: Secondary | ICD-10-CM | POA: Diagnosis not present

## 2023-01-12 DIAGNOSIS — E1165 Type 2 diabetes mellitus with hyperglycemia: Secondary | ICD-10-CM | POA: Diagnosis not present

## 2023-01-12 LAB — GLUCOSE, CAPILLARY
Glucose-Capillary: 88 mg/dL (ref 70–99)
Glucose-Capillary: 163 mg/dL — ABNORMAL HIGH (ref 70–99)

## 2023-01-12 LAB — BASIC METABOLIC PANEL
Anion gap: 11 (ref 5–15)
BUN: 34 mg/dL — ABNORMAL HIGH (ref 8–23)
CO2: 24 mmol/L (ref 22–32)
Calcium: 8.1 mg/dL — ABNORMAL LOW (ref 8.9–10.3)
Chloride: 107 mmol/L (ref 98–111)
Creatinine, Ser: 1.56 mg/dL — ABNORMAL HIGH (ref 0.44–1.00)
GFR, Estimated: 34 mL/min — ABNORMAL LOW (ref >60)
Glucose, Bld: 111 mg/dL — ABNORMAL HIGH (ref 70–99)
Potassium: 3.4 mmol/L — ABNORMAL LOW (ref 3.5–5.1)
Sodium: 142 mmol/L (ref 135–145)

## 2023-01-12 LAB — RAPID URINE DRUG SCREEN, HOSP PERFORMED
Amphetamines: NOT DETECTED
Barbiturates: NOT DETECTED
Benzodiazepines: NOT DETECTED
Cocaine: NOT DETECTED
Opiates: NOT DETECTED
Tetrahydrocannabinol: NOT DETECTED

## 2023-01-12 LAB — CBC
HCT: 30.3 % — ABNORMAL LOW (ref 36.0–46.0)
Hemoglobin: 10.4 g/dL — ABNORMAL LOW (ref 12.0–15.0)
MCH: 30.1 pg (ref 26.0–34.0)
MCHC: 34.3 g/dL (ref 30.0–36.0)
MCV: 87.8 fL (ref 80.0–100.0)
Platelets: 186 10*3/uL (ref 150–400)
RBC: 3.45 MIL/uL — ABNORMAL LOW (ref 3.87–5.11)
RDW: 11.6 % (ref 11.5–15.5)
WBC: 7 10*3/uL (ref 4.0–10.5)
nRBC: 0 % (ref 0.0–0.2)

## 2023-01-12 LAB — MAGNESIUM: Magnesium: 1.2 mg/dL — ABNORMAL LOW (ref 1.7–2.4)

## 2023-01-12 LAB — CBG MONITORING, ED
Glucose-Capillary: 106 mg/dL — ABNORMAL HIGH (ref 70–99)
Glucose-Capillary: 187 mg/dL — ABNORMAL HIGH (ref 70–99)

## 2023-01-12 MED ORDER — SODIUM CHLORIDE 0.9 % IV BOLUS
1000.0000 mL | Freq: Once | INTRAVENOUS | Status: AC
  Administered 2023-01-12: 1000 mL via INTRAVENOUS

## 2023-01-12 MED ORDER — POTASSIUM CHLORIDE CRYS ER 10 MEQ PO TBCR
40.0000 meq | EXTENDED_RELEASE_TABLET | Freq: Once | ORAL | Status: AC
  Administered 2023-01-12: 40 meq via ORAL
  Filled 2023-01-12: qty 4

## 2023-01-12 MED ORDER — MAGNESIUM SULFATE 4 GM/100ML IV SOLN
4.0000 g | Freq: Once | INTRAVENOUS | Status: AC
  Administered 2023-01-12: 4 g via INTRAVENOUS
  Filled 2023-01-12: qty 100

## 2023-01-12 MED ORDER — SODIUM CHLORIDE 0.9 % IV SOLN: INTRAVENOUS | Status: DC

## 2023-01-12 NOTE — Progress Notes (Signed)
 Admission note - Patient arrived from ED via stretcher.  All VVS.  Denies pain.  Speech somewhat clear yet slurred.  Able to decipher some sentences.  Alert, oriented x1 person.  Will continue to monitor.

## 2023-01-12 NOTE — Care Management Obs Status (Signed)
 MEDICARE OBSERVATION STATUS NOTIFICATION   Patient Details  Name: Nichole Munoz MRN: [id number] Date of Birth: 02-20-1945   Medicare Observation Status Notification Given:  Yes    Oletta Cohn, RN 01/12/2023, 9:30 AM

## 2023-01-12 NOTE — ED Notes (Signed)
 Attending physician at bedside using interpreter to discuss pt admission

## 2023-01-12 NOTE — Progress Notes (Addendum)
 Patient unable to answer the admission question and told me to call her son.  Assisted patient with ordering meal.  No distress noted.  Swallowed pills without difficulty.   Tolerated diet well.  No distress noted.  PIV placed in the right hand without difficulty and LR at 125 ml/hr infusing.  Will continue to monitor.

## 2023-01-12 NOTE — ED Notes (Signed)
 Pt is NSR on monitor

## 2023-01-12 NOTE — Progress Notes (Addendum)
 PROGRESS NOTE    Nichole Munoz  FMW:[id number] DOB: [birth year] DOA: 01/11/2023 PCP: [facility]    Chief Complaint  Patient presents with   Near Syncope    Brief Narrative:  Patient 78 year old Cambodian speaking female with history of prior CVA, CKD stage IIIa, IDDM 2, hypertension, hyperlipidemia, GERD who presented with a syncopal episode.  Patient initially stated felt weak all over and noted to have fainted while walking into the kitchen.  Patient subsequently noted somebody pounding on the chest.  It is noted per admitting physician that son stated that patient had a vomiting episode prior to prior to his passing out.  Patient admitted for syncopal workup.   Assessment & Plan:   Principal Problem:   Syncope Active Problems:   Hypertensive urgency   Uncontrolled type 2 diabetes mellitus with hyperglycemia, with long-term current use of insulin  (HCC)   Stage 3a chronic kidney disease (CKD) (HCC) - baseline SCr 1.4   History of CVA with residual deficit   Hyperlipidemia  #1 suspected syncope -Patient presented after syncopal episode. -??  Etiology. -Patient with complaints of generalized weakness prior to passing out with some lightheadedness and was noted per admitting physician as son stated patient had an episode of vomiting prior to syncopal episode. -It was noted patient had loss of pulse per son and CPR started prior to EMS arrival. -High-sensitivity troponin negative. -CT angiogram chest negative for PE. -CT head negative for any acute abnormalities. -2D echo with a EF of 60 to 65%, NWMA, grade 1 DD, no significant valvular abnormalities noted. -Patient with no focal neurological deficits. -Monitor on telemetry. -Patient noted with soft blood pressure this morning and as such we will discontinue antihypertensive medications. -Placed on IV fluids. -May need event monitor on discharge if no further etiology noted.  2.  Hypertension urgency -It  is noted on admission the patient had blood pressures elevated up to 197/116. -Patient placed back on home regimen of Norvasc  5 mg daily, lisinopril  10 mg daily. -Systolic blood pressure noted to be in the 90s to low 100s today and as such we will discontinue antihypertensive medications of Norvasc  and lisinopril . -IV fluids. -Supportive care.  3.  Uncontrolled type 2 diabetes mellitus with long-term insulin  use -Hemoglobin A1c 12.2 (06/03/2021) -Check a hemoglobin A1c. -CBG noted at 106 this morning. -Patient noted to be on metformin , SSI, Lantus  20 units twice daily, and also Neurontin  prior to admission. -Hold metformin  in-house. -Continue Semglee  10 units twice daily, SSI, Neurontin .  -Outpatient follow-up.   4 CKD stage IIIa -Baseline creatinine 1.3-1.5 -Creatinine currently at 1.56 this morning with also increasing BUN. -Close to baseline. -IV fluids for the next 24 hours.  5.  Hypomagnesemia/hypokalemia -Magnesium  noted at 1.1 on admission with repeat at 1.2 this morning. -Potassium at 3.4. -K-Dur 40 mill equivalents p.o. x 1. -Magnesium  sulfate 4 g IV x 1. -Repeat labs in the AM.  6.  Hyperlipidemia -Continue statin.  7.  History of CVA with residual deficit -Patient with residual left-sided weakness from prior stroke. -CT head with no acute abnormalities. -Continue statin.  8.  Normocytic anemia -Patient with no overt bleeding. -Check an anemia panel. -Follow H&H. -Transfusion threshold hemoglobin < 7.       DVT prophylaxis: Lovenox  Code Status: Full Family Communication: Updated patient via interpreter on iPad.  No family at bedside. Disposition: Likely home once assessed by PT with clinical improvement.  Status is: Observation The patient remains OBS appropriate and will d/c  before 2 midnights.   Consultants:  None  Procedures:  CT head 01/11/2023 CT angiogram chest 01/11/2023 Chest x-ray 12/11/2023 2D echo 01/11/2023   Antimicrobials:   Anti-infectives (From admission, onward)    None         Subjective: Patient laying on gurney.  Denies any chest pain or shortness of breath.  No abdominal pain.  Stated before she passed out she just had sudden weakness in her legs.  Denies any palpitations.  Denies any vomiting although per admitting physician son stated patient had a bout of emesis prior to passing out.  Overall states she is feeling well.  Asking when she is going to be able to go home.  iPad interpreter used for Camb[place]ian language interpreter name Nichole Munoz with ID number [id number].  Objective: Vitals:   01/12/23 1030 01/12/23 1139 01/12/23 1200 01/12/23 1400  BP: 126/83  120/70 (!) 108/58  Pulse: 93  72 68  Resp: (!) 27  15 18   Temp:  98.2 F (36.8 C)  98.1 F (36.7 C)  TempSrc:  Oral  Oral  SpO2: 98%  99% 95%  Weight:      Height:        Intake/Output Summary (Last 24 hours) at 01/12/2023 1745 Last data filed at 01/12/2023 1409 Gross per 24 hour  Intake 100 ml  Output --  Net 100 ml   Filed Weights   01/11/23 0730  Weight: 56.7 kg    Examination:  General exam: Appears calm and comfortable  Respiratory system: Clear to auscultation.  No wheezes, no crackles, no rhonchi.  Fair air movement.  Speaking in full sentences.  Respiratory effort normal. Cardiovascular system: S1 & S2 heard, RRR. No JVD, murmurs, rubs, gallops or clicks. No pedal edema. Gastrointestinal system: Abdomen is nondistended, soft and nontender. No organomegaly or masses felt. Normal bowel sounds heard. Central nervous system: Alert and oriented.  Cranial nerves II through XII grossly intact.  Moving extremities spontaneously.  No focal neurological deficits. Extremities: Symmetric 5 x 5 power. Skin: No rashes, lesions or ulcers Psychiatry: Judgement and insight appear fair. Mood & affect appropriate.     Data Reviewed: I have personally reviewed following labs and imaging studies  CBC: Recent Labs  Lab 01/11/23 0812  01/12/23 0457  WBC 7.7 7.0  HGB 12.4 10.4*  HCT 36.6 30.3*  MCV 86.9 87.8  PLT 199 186    Basic Metabolic Panel: Recent Labs  Lab 01/11/23 0812 01/11/23 2151 01/12/23 0457 01/12/23 0904  NA 144  --  142  --   K 3.8  --  3.4*  --   CL 105  --  107  --   CO2 28  --  24  --   GLUCOSE 176*  --  111*  --   BUN 25*  --  34*  --   CREATININE 1.36*  --  1.56*  --   CALCIUM  9.1  --  8.1*  --   MG  --  1.1*  --  1.2*    GFR: Estimated Creatinine Clearance: 23.5 mL/min (A) (by C-G formula based on SCr of 1.56 mg/dL (H)).  Liver Function Tests: Recent Labs  Lab 01/11/23 0812  AST 19  ALT 11  ALKPHOS 61  BILITOT 0.6  PROT 7.5  ALBUMIN 3.6    CBG: Recent Labs  Lab 01/11/23 1728 01/11/23 2059 01/12/23 0757 01/12/23 1127 01/12/23 1633  GLUCAP 93 244* 106* 187* 88     Recent Results (from the past 240  hours)  Resp panel by RT-PCR (RSV, Flu A&B, Covid) Anterior Nasal Swab     Status: None   Collection Time: 01/11/23  7:38 AM   Specimen: Anterior Nasal Swab  Result Value Ref Range Status   SARS Coronavirus 2 by RT PCR NEGATIVE NEGATIVE Final   Influenza A by PCR NEGATIVE NEGATIVE Final   Influenza B by PCR NEGATIVE NEGATIVE Final    Comment: (NOTE) The Xpert Xpress SARS-CoV-2/FLU/RSV plus assay is intended as an aid in the diagnosis of influenza from Nasopharyngeal swab specimens and should not be used as a sole basis for treatment. Nasal washings and aspirates are unacceptable for Xpert Xpress SARS-CoV-2/FLU/RSV testing.  Fact Sheet for Patients: bloggercourse.com  Fact Sheet for Healthcare Providers: seriousbroker.it  This test is not yet approved or cleared by the [place]  FDA and has been authorized for detection and/or diagnosis of SARS-CoV-2 by FDA under an Emergency Use Authorization (EUA). This EUA will remain in effect (meaning this test can be used) for the duration of the COVID-19 declaration  under Section 564(b)(1) of the Act, 21 U.S.C. section 360bbb-3(b)(1), unless the authorization is terminated or revoked.     Resp Syncytial Virus by PCR NEGATIVE NEGATIVE Final    Comment: (NOTE) Fact Sheet for Patients: bloggercourse.com  Fact Sheet for Healthcare Providers: seriousbroker.it  This test is not yet approved or cleared by the [place]  FDA and has been authorized for detection and/or diagnosis of SARS-CoV-2 by FDA under an Emergency Use Authorization (EUA). This EUA will remain in effect (meaning this test can be used) for the duration of the COVID-19 declaration under Section 564(b)(1) of the Act, 21 U.S.C. section 360bbb-3(b)(1), unless the authorization is terminated or revoked.  Performed at [facility], [address], [place], [place] [address]          Radiology Studies: ECHOCARDIOGRAM COMPLETE Result Date: 01/11/2023    ECHOCARDIOGRAM REPORT   Patient Name:   ZAREENA Sparr  Date of Exam: 01/11/2023 Medical Rec #:  [id number]  Height:       60.0 in Accession #:    [id number] Weight:       125.0 lb Date of Birth:  [birth year]   BSA:          1.529 m Patient Age:    77 years   BP:           147/77 mmHg Patient Gender: F          HR:           85 bpm. Exam Location:  Inpatient Procedure: 2D Echo, Cardiac Doppler and Color Doppler Indications:    Syncope R55  History:        Patient has prior history of Echocardiogram examinations, most                 recent 04/21/2019. Stroke and CKD, stage 3,                 Signs/Symptoms:Syncope; Risk Factors:Hypertension, Diabetes and                 Dyslipidemia.  Sonographer:    Thea Norlander RCS Referring Phys: [phone] RONDELL A SMITH IMPRESSIONS  1. Left ventricular ejection fraction, by estimation, is 60 to 65%. The left ventricle has normal function. The left ventricle has no regional wall motion abnormalities. Left ventricular diastolic parameters are consistent with  Grade I diastolic dysfunction (impaired relaxation).  2. Right ventricular systolic function is normal. The right  ventricular size is normal. Tricuspid regurgitation signal is inadequate for assessing PA pressure.  3. The mitral valve is grossly normal. Trivial mitral valve regurgitation.  4. The aortic valve has an indeterminant number of cusps. There is mild thickening of the aortic valve. Aortic valve regurgitation is mild.  5. The inferior vena cava is normal in size with greater than 50% respiratory variability, suggesting right atrial pressure of 3 mmHg. Comparison(s): Prior images reviewed side by side. LVEF remains normal range at 60-65%. FINDINGS  Left Ventricle: Left ventricular ejection fraction, by estimation, is 60 to 65%. The left ventricle has normal function. The left ventricle has no regional wall motion abnormalities. The left ventricular internal cavity size was normal in size. There is  no left ventricular hypertrophy. Left ventricular diastolic parameters are consistent with Grade I diastolic dysfunction (impaired relaxation). Right Ventricle: The right ventricular size is normal. No increase in right ventricular wall thickness. Right ventricular systolic function is normal. Tricuspid regurgitation signal is inadequate for assessing PA pressure. Left Atrium: Left atrial size was normal in size. Right Atrium: Right atrial size was normal in size. Pericardium: There is no evidence of pericardial effusion. Mitral Valve: The mitral valve is grossly normal. There is mild thickening of the mitral valve leaflet(s). Trivial mitral valve regurgitation. Tricuspid Valve: The tricuspid valve is grossly normal. Tricuspid valve regurgitation is mild. Aortic Valve: The aortic valve has an indeterminant number of cusps. There is mild thickening of the aortic valve. There is mild aortic valve annular calcification. Aortic valve regurgitation is mild. Aortic regurgitation PHT measures 1033 msec. Aortic valve peak  gradient measures 8.6 mmHg. Pulmonic Valve: The pulmonic valve was not well visualized. Pulmonic valve regurgitation is trivial. Aorta: The aortic root and ascending aorta are structurally normal, with no evidence of dilitation. Venous: The inferior vena cava is normal in size with greater than 50% respiratory variability, suggesting right atrial pressure of 3 mmHg. IAS/Shunts: No atrial level shunt detected by color flow Doppler.  LEFT VENTRICLE PLAX 2D LVIDd:         4.00 cm   Diastology LVIDs:         2.70 cm   LV e' medial:    5.66 cm/s LV PW:         0.90 cm   LV E/e' medial:  8.4 LV IVS:        0.80 cm   LV e' lateral:   8.70 cm/s LVOT diam:     1.70 cm   LV E/e' lateral: 5.5 LV SV:         37 LV SV Index:   25 LVOT Area:     2.27 cm  RIGHT VENTRICLE             IVC RV S prime:     12.50 cm/s  IVC diam: 1.20 cm TAPSE (M-mode): 1.7 cm LEFT ATRIUM           Index        RIGHT ATRIUM           Index LA Vol (A2C): 41.0 ml 26.82 ml/m  RA Area:     10.40 cm LA Vol (A4C): 27.7 ml 18.12 ml/m  RA Volume:   17.40 ml  11.38 ml/m  AORTIC VALVE AV Area (Vmax): 1.36 cm AV Vmax:        146.50 cm/s AV Peak Grad:   8.6 mmHg LVOT Vmax:      87.50 cm/s LVOT Vmean:     58.200  cm/s LVOT VTI:       0.165 m AI PHT:         1033 msec  AORTA Ao Root diam: 3.40 cm Ao Asc diam:  2.20 cm MITRAL VALVE MV Area (PHT): 5.13 cm     SHUNTS MV Decel Time: 148 msec     Systemic VTI:  0.16 m MV E velocity: 47.70 cm/s   Systemic Diam: 1.70 cm MV A velocity: 113.00 cm/s MV E/A ratio:  0.42 Jayson Sierras MD Electronically signed by Jayson Sierras MD Signature Date/Time: 01/11/2023/6:03:59 PM    Final    CT Angio Chest PE W and/or Wo Contrast Result Date: 01/11/2023 CLINICAL DATA:  Pulmonary embolism suspected, low to intermediate probability with positive D-dimer. EXAM: CT ANGIOGRAPHY CHEST WITH CONTRAST TECHNIQUE: Multidetector CT imaging of the chest was performed using the standard protocol during bolus administration of intravenous  contrast. Multiplanar CT image reconstructions and MIPs were obtained to evaluate the vascular anatomy. RADIATION DOSE REDUCTION: This exam was performed according to the departmental dose-optimization program which includes automated exposure control, adjustment of the mA and/or kV according to patient size and/or use of iterative reconstruction technique. CONTRAST:  60mL OMNIPAQUE  IOHEXOL  350 MG/ML SOLN COMPARISON:  06/04/2022 FINDINGS: Cardiovascular: Satisfactory opacification of the pulmonary arteries to the segmental level. No evidence of pulmonary embolism when allowing for artifact from multilevel motion. Normal heart size. No pericardial effusion. Mediastinum/Nodes: Calcified right hilar lymph nodes, also seen on prior and attributed to remote granulomatous disease. Scattered atheromatous calcification of the aorta and coronaries. Lungs/Pleura: There is no edema, consolidation, effusion, or pneumothorax. Mild subpleural scarring in the medial right lower lobe. Upper Abdomen: Cholelithiasis. Musculoskeletal: No acute or aggressive finding Review of the MIP images confirms the above findings. IMPRESSION: 1. Negative for pulmonary embolism or other acute finding. 2. Atherosclerosis and cholelithiasis. Electronically Signed   By: Dorn Roulette M.D.   On: 01/11/2023 09:49   DG Chest Portable 1 View Result Date: 01/11/2023 CLINICAL DATA:  Vomiting. EXAM: PORTABLE CHEST 1 VIEW COMPARISON:  06/04/2022 FINDINGS: Artifact from EKG leads. Normal heart size and mediastinal contours. No acute infiltrate or edema. No effusion or pneumothorax. No acute osseous findings. IMPRESSION: No active disease. Electronically Signed   By: Dorn Roulette M.D.   On: 01/11/2023 08:19   CT Head Wo Contrast Result Date: 01/11/2023 CLINICAL DATA:  78 year old female with history of altered mental status. Syncope. EXAM: CT HEAD WITHOUT CONTRAST TECHNIQUE: Contiguous axial images were obtained from the base of the skull through the  vertex without intravenous contrast. RADIATION DOSE REDUCTION: This exam was performed according to the departmental dose-optimization program which includes automated exposure control, adjustment of the mA and/or kV according to patient size and/or use of iterative reconstruction technique. COMPARISON:  PET-CT 06/04/2022. FINDINGS: Brain: Mild cerebral atrophy. Patchy and confluent areas of decreased attenuation are noted throughout the deep and periventricular white matter of the cerebral hemispheres bilaterally, compatible with chronic microvascular ischemic disease. Cavum septum pellucidum (normal anatomical variant) incidentally noted. No evidence of acute infarction, hemorrhage, hydrocephalus, extra-axial collection or mass lesion/mass effect. Vascular: No hyperdense vessel or unexpected calcification. Skull: Normal. Negative for fracture or focal lesion. Sinuses/Orbits: No acute finding. Other: None. IMPRESSION: 1. No evidence of significant acute traumatic injury to the skull or brain. 2. Mild cerebral atrophy with chronic microvascular ischemic changes in the cerebral white matter, as above. Electronically Signed   By: Toribio Aye M.D.   On: 01/11/2023 08:02  Scheduled Meds:  enoxaparin  (LOVENOX ) injection  30 mg Subcutaneous Q24H   gabapentin   400 mg Oral TID   insulin  aspart  0-9 Units Subcutaneous TID WC   insulin  glargine-yfgn  10 Units Subcutaneous BID   mirtazapine   30 mg Oral QHS   rosuvastatin   40 mg Oral Daily   sodium chloride  flush  3 mL Intravenous Q12H   Continuous Infusions:  sodium chloride  125 mL/hr at 01/12/23 1129     LOS: 0 days    Time spent: 40 minutes    Toribio Hummer, MD [facility]   To contact the attending provider between 7A-7P or the covering provider during after hours 7P-7A, please log into the web site [email or web address] and access using universal [place] password for that web site. If you do not have the password, please call  the hospital operator.  01/12/2023, 5:45 PM

## 2023-01-12 NOTE — Evaluation (Signed)
 Physical Therapy Evaluation Patient Details Name: Nichole Munoz MRN: [id number] DOB: 1945-11-29 Today's Date: 01/12/2023  History of Present Illness  Patient is 78 y.o. female reported feeling weak and fainting while walking to the kitchen this morning. The next thing she recalls is someone pounding on her chest.  Patient reports generalized body aches, bil LE pain, and mild SOB. Pt has hxof prior Lt arm fracture that causes pain when the weather gets cold. Troponins neg x2, CT of chest neg for PE. Pt admitted for syncopal episode. PMH significant for DM, neuropathy, HTN, HLD, arthritis, CKD IIIa, CVA w/ Lt residual weakness, C7 fx in 2018.   Clinical Impression  Nichole Munoz is 78 y.o. female admitted with above HPI and diagnosis. Patient is currently limited by functional impairments below (see PT problem list). Patient lives with family and is has assistance for ADL's. Unclear if pt requires assist for transfers and gait however does report she uses SPC. Patient will benefit from continued skilled PT interventions to address impairments and progress independence with mobility. Acute PT will follow and progress as able.         If plan is discharge home, recommend the following: A lot of help with walking and/or transfers;A lot of help with bathing/dressing/bathroom;Assistance with cooking/housework;Assist for transportation;Help with stairs or ramp for entrance   Can travel by private vehicle        Equipment Recommendations    Recommendations for Other Services  OT consult    Functional Status Assessment Patient has had a recent decline in their functional status and demonstrates the ability to make significant improvements in function in a reasonable and predictable amount of time.     Precautions / Restrictions Precautions Precautions: Fall Restrictions Weight Bearing Restrictions Per Provider Order: No      Mobility  Bed Mobility Overal bed mobility: Needs Assistance Bed Mobility:  Supine to Sit, Sit to Supine     Supine to sit: Min assist Sit to supine: Min assist   General bed mobility comments: Min assist to raise trunk upright and stabilize balance for safety due to tremors/shake. Tremor/twitch greater on Rt UE vs Lt. Assist to control return to supine.    Transfers Overall transfer level: Needs assistance Equipment used: 1 person hand held assist Transfers: Sit to/from Stand Sit to Stand: Min assist           General transfer comment: HHA on Rt side for sit<>stand from EOB. pt scooting Rt foot along floor for lateral steps at EOB.    Ambulation/Gait Ambulation/Gait assistance: Min assist Gait Distance (Feet): 15 Feet (2x15) Assistive device: 1 person hand held assist Gait Pattern/deviations: Step-through pattern, Decreased stride length Gait velocity: decr     General Gait Details: HHA on Rt for support and assist with gait belt to shift weight with stepping. pt amb short bout around stretcher in room x2.  Stairs            Wheelchair Mobility     Tilt Bed    Modified Rankin (Stroke Patients Only)       Balance Overall balance assessment: Needs assistance Sitting-balance support: Feet supported, Bilateral upper extremity supported Sitting balance-Leahy Scale: Fair     Standing balance support: Single extremity supported, During functional activity Standing balance-Leahy Scale: Poor Standing balance comment: reliant on external support                             Pertinent Vitals/Pain  Pain Assessment Pain Assessment: Faces Faces Pain Scale: Hurts a little bit Pain Location: pt reports generalized pain Pain Descriptors / Indicators: Discomfort Pain Intervention(s): Limited activity within patient's tolerance, Monitored during session, Repositioned    Home Living Family/patient expects to be discharged to:: Private residence Living Arrangements: Children Available Help at Discharge: Family Type of Home:  Apartment Home Access: Level entry     Alternate Level Stairs-Number of Steps: 13 (pt has to take 3 breaks when climbing stairs to shower) Home Layout: Two level;Bed/bath upstairs;1/2 bath on main level;Able to live on main level with bedroom/bathroom Home Equipment: [place] - single point Additional Comments: son and daughter (son works night daughter during day)    Prior Function Prior Level of Function : Needs assist       Physical Assist : ADLs (physical)   ADLs (physical): Grooming;Bathing;Dressing Mobility Comments: uses SPC for mobility       Extremity/Trunk Assessment   Upper Extremity Assessment Upper Extremity Assessment: Generalized weakness (Rt UE with resting tremor/twitch)    Lower Extremity Assessment Lower Extremity Assessment: Generalized weakness;RLE deficits/detail;LLE deficits/detail    Cervical / Trunk Assessment Cervical / Trunk Assessment: Normal  Communication   Communication Communication: Other (comment) (interpreter)  Cognition Arousal: Alert Behavior During Therapy: WFL for tasks assessed/performed Overall Cognitive Status: Difficult to assess                                 General Comments: overall pt cooperative and pleasant, aware in hospital and aware of situation leading to admission. Pt answering questions appropriately through interptretter        General Comments      Exercises     Assessment/Plan    PT Assessment Patient needs continued PT services  PT Problem List Decreased strength;Decreased range of motion;Decreased activity tolerance;Decreased balance;Decreased mobility;Decreased knowledge of use of DME;Decreased coordination;Decreased safety awareness;Decreased knowledge of precautions;Cardiopulmonary status limiting activity;Impaired sensation;Impaired tone       PT Treatment Interventions DME instruction;Gait training;Stair training;Functional mobility training;Therapeutic activities;Therapeutic  exercise;Balance training;Neuromuscular re-education;Cognitive remediation;Patient/family education    PT Goals (Current goals can be found in the Care Plan section)  Acute Rehab PT Goals Patient Stated Goal: get better PT Goal Formulation: With patient Time For Goal Achievement: 01/26/23 Potential to Achieve Goals: Good    Frequency Min 1X/week     Co-evaluation               AM-PAC PT 6 Clicks Mobility  Outcome Measure Help needed turning from your back to your side while in a flat bed without using bedrails?: A Little Help needed moving from lying on your back to sitting on the side of a flat bed without using bedrails?: A Little Help needed moving to and from a bed to a chair (including a wheelchair)?: A Little Help needed standing up from a chair using your arms (e.g., wheelchair or bedside chair)?: A Little Help needed to walk in hospital room?: A Little Help needed climbing 3-5 steps with a railing? : A Lot 6 Click Score: 17    End of Session Equipment Utilized During Treatment: Gait belt Activity Tolerance: Patient tolerated treatment well Patient left: in bed;with call bell/phone within reach Nurse Communication: Mobility status PT Visit Diagnosis: Muscle weakness (generalized) (M62.81);Difficulty in walking, not elsewhere classified (R26.2);Other abnormalities of gait and mobility (R26.89);Other symptoms and signs involving the nervous system (R29.898);Hemiplegia and hemiparesis Hemiplegia - Right/Left: Left Hemiplegia - caused by:  Cerebral infarction (chronic)    Time: 1023-1050 PT Time Calculation (min) (ACUTE ONLY): 27 min   Charges:   PT Evaluation $PT Eval Moderate Complexity: 1 Mod PT Treatments $Gait Training: 8-22 mins PT General Charges $$ ACUTE PT VISIT: 1 Visit         Vernell DONEEN KLEIN, DPT Acute Rehabilitation Services Office [phone]  01/12/23 3:19 PM

## 2023-01-13 ENCOUNTER — Other Ambulatory Visit: Payer: Self-pay

## 2023-01-13 DIAGNOSIS — E1165 Type 2 diabetes mellitus with hyperglycemia: Secondary | ICD-10-CM | POA: Diagnosis not present

## 2023-01-13 DIAGNOSIS — Z794 Long term (current) use of insulin: Secondary | ICD-10-CM | POA: Diagnosis not present

## 2023-01-13 DIAGNOSIS — I160 Hypertensive urgency: Secondary | ICD-10-CM | POA: Diagnosis not present

## 2023-01-13 DIAGNOSIS — R55 Syncope and collapse: Secondary | ICD-10-CM | POA: Diagnosis not present

## 2023-01-13 DIAGNOSIS — N1831 Chronic kidney disease, stage 3a: Secondary | ICD-10-CM | POA: Diagnosis not present

## 2023-01-13 LAB — CBC
HCT: 34.9 % — ABNORMAL LOW (ref 36.0–46.0)
Hemoglobin: 11.5 g/dL — ABNORMAL LOW (ref 12.0–15.0)
MCH: 29.7 pg (ref 26.0–34.0)
MCHC: 33 g/dL (ref 30.0–36.0)
MCV: 90.2 fL (ref 80.0–100.0)
Platelets: 185 10*3/uL (ref 150–400)
RBC: 3.87 MIL/uL (ref 3.87–5.11)
RDW: 11.9 % (ref 11.5–15.5)
WBC: 6.8 10*3/uL (ref 4.0–10.5)
nRBC: 0 % (ref 0.0–0.2)

## 2023-01-13 LAB — GLUCOSE, CAPILLARY
Glucose-Capillary: 59 mg/dL — ABNORMAL LOW (ref 70–99)
Glucose-Capillary: 80 mg/dL (ref 70–99)
Glucose-Capillary: 85 mg/dL (ref 70–99)
Glucose-Capillary: 176 mg/dL — ABNORMAL HIGH (ref 70–99)
Glucose-Capillary: 80 mg/dL (ref 70–99)
Glucose-Capillary: 144 mg/dL — ABNORMAL HIGH (ref 70–99)

## 2023-01-13 LAB — BASIC METABOLIC PANEL
Anion gap: 8 (ref 5–15)
BUN: 30 mg/dL — ABNORMAL HIGH (ref 8–23)
CO2: 22 mmol/L (ref 22–32)
Calcium: 8.4 mg/dL — ABNORMAL LOW (ref 8.9–10.3)
Chloride: 113 mmol/L — ABNORMAL HIGH (ref 98–111)
Creatinine, Ser: 1.39 mg/dL — ABNORMAL HIGH (ref 0.44–1.00)
GFR, Estimated: 39 mL/min — ABNORMAL LOW (ref >60)
Glucose, Bld: 76 mg/dL (ref 70–99)
Potassium: 4.4 mmol/L (ref 3.5–5.1)
Sodium: 143 mmol/L (ref 135–145)

## 2023-01-13 LAB — MAGNESIUM: Magnesium: 2.4 mg/dL (ref 1.7–2.4)

## 2023-01-13 LAB — HEMOGLOBIN A1C
Hgb A1c MFr Bld: 7.7 % — ABNORMAL HIGH (ref 4.8–5.6)
Mean Plasma Glucose: 174 mg/dL

## 2023-01-13 MED ORDER — INSULIN GLARGINE-YFGN 100 UNIT/ML ~~LOC~~ SOLN
10.0000 [IU] | Freq: Every day | SUBCUTANEOUS | Status: DC
  Administered 2023-01-14: 10 [IU] via SUBCUTANEOUS
  Filled 2023-01-13: qty 0.1

## 2023-01-13 MED ORDER — AMLODIPINE BESYLATE 5 MG PO TABS
5.0000 mg | ORAL_TABLET | Freq: Every day | ORAL | Status: DC
  Administered 2023-01-13 – 2023-01-14 (×2): 5 mg via ORAL
  Filled 2023-01-13 (×2): qty 1

## 2023-01-13 MED ORDER — INSULIN ASPART 100 UNIT/ML IJ SOLN: 0.0000 [IU] | Freq: Three times a day (TID) | INTRAMUSCULAR | Status: DC

## 2023-01-13 NOTE — Plan of Care (Signed)
 Problem: Education: Goal: Ability to describe self-care measures that may prevent or decrease complications (Diabetes Survival Skills Education) will improve 01/13/2023 0119 by Margie Challenger, RN Outcome: Progressing 01/13/2023 0119 by Margie Challenger, RN Outcome: Progressing Goal: Individualized Educational Video(s) 01/13/2023 0119 by Margie Challenger, RN Outcome: Progressing 01/13/2023 0119 by Margie Challenger, RN Outcome: Progressing   Problem: Coping: Goal: Ability to adjust to condition or change in health will improve 01/13/2023 0119 by Margie Challenger, RN Outcome: Progressing 01/13/2023 0119 by Margie Challenger, RN Outcome: Progressing   Problem: Fluid Volume: Goal: Ability to maintain a balanced intake and output will improve 01/13/2023 0119 by Margie Challenger, RN Outcome: Progressing 01/13/2023 0119 by Margie Challenger, RN Outcome: Progressing   Problem: Health Behavior/Discharge Planning: Goal: Ability to identify and utilize available resources and services will improve 01/13/2023 0119 by Margie Challenger, RN Outcome: Progressing 01/13/2023 0119 by Margie Challenger, RN Outcome: Progressing Goal: Ability to manage health-related needs will improve 01/13/2023 0119 by Margie Challenger, RN Outcome: Progressing 01/13/2023 0119 by Margie Challenger, RN Outcome: Progressing   Problem: Metabolic: Goal: Ability to maintain appropriate glucose levels will improve 01/13/2023 0119 by Margie Challenger, RN Outcome: Progressing 01/13/2023 0119 by Margie Challenger, RN Outcome: Progressing   Problem: Nutritional: Goal: Maintenance of adequate nutrition will improve 01/13/2023 0119 by Margie Challenger, RN Outcome: Progressing 01/13/2023 0119 by Margie Challenger, RN Outcome: Progressing Goal: Progress toward achieving an optimal weight will improve 01/13/2023 0119 by Margie Challenger, RN Outcome: Progressing 01/13/2023 0119 by  Margie Challenger, RN Outcome: Progressing   Problem: Skin Integrity: Goal: Risk for impaired skin integrity will decrease 01/13/2023 0119 by Margie Challenger, RN Outcome: Progressing 01/13/2023 0119 by Margie Challenger, RN Outcome: Progressing   Problem: Tissue Perfusion: Goal: Adequacy of tissue perfusion will improve 01/13/2023 0119 by Margie Challenger, RN Outcome: Progressing 01/13/2023 0119 by Margie Challenger, RN Outcome: Progressing   Problem: Education: Goal: Knowledge of General Education information will improve Description: Including pain rating scale, medication(s)/side effects and non-pharmacologic comfort measures 01/13/2023 0119 by Margie Challenger, RN Outcome: Progressing 01/13/2023 0119 by Margie Challenger, RN Outcome: Progressing   Problem: Health Behavior/Discharge Planning: Goal: Ability to manage health-related needs will improve 01/13/2023 0119 by Margie Challenger, RN Outcome: Progressing 01/13/2023 0119 by Margie Challenger, RN Outcome: Progressing   Problem: Clinical Measurements: Goal: Ability to maintain clinical measurements within normal limits will improve 01/13/2023 0119 by Margie Challenger, RN Outcome: Progressing 01/13/2023 0119 by Margie Challenger, RN Outcome: Progressing Goal: Will remain free from infection 01/13/2023 0119 by Margie Challenger, RN Outcome: Progressing 01/13/2023 0119 by Margie Challenger, RN Outcome: Progressing Goal: Diagnostic test results will improve 01/13/2023 0119 by Margie Challenger, RN Outcome: Progressing 01/13/2023 0119 by Margie Challenger, RN Outcome: Progressing Goal: Respiratory complications will improve 01/13/2023 0119 by Margie Challenger, RN Outcome: Progressing 01/13/2023 0119 by Margie Challenger, RN Outcome: Progressing Goal: Cardiovascular complication will be avoided 01/13/2023 0119 by Margie Challenger, RN Outcome: Progressing 01/13/2023 0119 by Margie Challenger, RN Outcome: Progressing   Problem: Activity: Goal: Risk for activity intolerance will decrease 01/13/2023 0119 by Margie Challenger, RN Outcome: Progressing 01/13/2023 0119 by Margie Challenger, RN Outcome: Progressing   Problem: Nutrition: Goal: Adequate nutrition will be maintained 01/13/2023 0119 by Margie Challenger, RN Outcome: Progressing 01/13/2023 0119 by Margie Challenger, RN Outcome: Progressing   Problem: Coping: Goal: Level of anxiety will decrease 01/13/2023 0119 by Margie Challenger, RN Outcome: Progressing 01/13/2023 0119 by Margie Challenger, RN Outcome: Progressing   Problem: Elimination: Goal: Will not experience complications related to bowel motility 01/13/2023 0119  by Margie Challenger, RN Outcome: Progressing 01/13/2023 0119 by Margie Challenger, RN Outcome: Progressing Goal: Will not experience complications related to urinary retention 01/13/2023 0119 by Margie Challenger, RN Outcome: Progressing 01/13/2023 0119 by Margie Challenger, RN Outcome: Progressing   Problem: Pain Management: Goal: General experience of comfort will improve 01/13/2023 0119 by Margie Challenger, RN Outcome: Progressing 01/13/2023 0119 by Margie Challenger, RN Outcome: Progressing   Problem: Safety: Goal: Ability to remain free from injury will improve 01/13/2023 0119 by Margie Challenger, RN Outcome: Progressing 01/13/2023 0119 by Margie Challenger, RN Outcome: Progressing   Problem: Skin Integrity: Goal: Risk for impaired skin integrity will decrease 01/13/2023 0119 by Margie Challenger, RN Outcome: Progressing 01/13/2023 0119 by Margie Challenger, RN Outcome: Progressing

## 2023-01-13 NOTE — Progress Notes (Signed)
 PROGRESS NOTE        PATIENT DETAILS Name: Nichole Munoz Age: 78 y.o. Sex: female Date of Birth: [birth year] Admit Date: 01/11/2023 Admitting Physician Maximino DELENA Sharps, MD PCP:[facility]  Brief Summary: Patient is a 78 y.o.  female with history of CVA-mild left residual deficit, CKD stage IIIa, DM-2, HTN, HLD-who presented with a syncopal episode.  Significant events: 1/5>> admit to [facility]  Significant studies: 1/5>> CT head: No acute intracranial abnormality 1/5>> CTA chest: No acute finding-no PE 1/5>> echo: EF 60-65%, grade 1 diastolic dysfunction  Significant microbiology data: 1/5>> COVID/influenza/RSV PCR: Negative  Procedures: None  Consults: None  Subjective: Lying comfortably in bed-asked me to open the blinds in her room.  No family at bedside.  Speaks some English and able to communicate.  Objective: Vitals: Blood pressure (!) 146/82, pulse 80, temperature (!) 97.4 F (36.3 C), temperature source Oral, resp. rate 15, height 5' (1.524 m), weight 56.7 kg, SpO2 98%.   Exam: Gen Exam:Alert awake-not in any distress HEENT:atraumatic, normocephalic Chest: B/L clear to auscultation anteriorly CVS:S1S2 regular Abdomen:soft non tender, non distended Extremities:no edema Neurology: Mild left-sided weakness. Skin: no rash  Pertinent Labs/Radiology:    Latest Ref Rng & Units 01/13/2023    4:58 AM 01/12/2023    4:57 AM 01/11/2023    8:12 AM  CBC  WBC 4.0 - 10.5 K/uL 6.8  7.0  7.7   Hemoglobin 12.0 - 15.0 g/dL 88.4  89.5  87.5   Hematocrit 36.0 - 46.0 % 34.9  30.3  36.6   Platelets 150 - 400 K/uL 185  186  199     Lab Results  Component Value Date   NA 143 01/13/2023   K 4.4 01/13/2023   CL 113 (H) 01/13/2023   CO2 22 01/13/2023      Assessment/Plan: Syncope Possibly vasovagal-as this happened after a vomiting episode. Telemetry negative overnight Echo with stable EF CTA chest negative for PE On IVF  overnight-volume status stable-will discontinue. Check orthostatic vital signs-mobilize with PT/OT and see how she does.  HTN Antihypertensives held 1/6 as blood pressure was noted to be soft BP slowly creeping up Will resume low-dose amlodipine  and see how she does Lisinopril  remains on hold.  HLD Statin  DM-2 (A1c 7.7 on 01/12/2023) Hypoglycemic episode this a.m. Change Semglee  to daily dosing-and see how she does Continue SSI-switch to extra sensitive SSI Allow some amount of permissive hyperglycemia  Recent Labs    01/12/23 2108 01/13/23 0838 01/13/23 0915  GLUCAP 163* 59* 80    History of CVA-mild left-sided residual weakness PT/OT reevaluation today-to make sure she is safe for discharge-appears very weak/debilitated.  CKD stage IIIa Close to baseline  Normocytic anemia Mild Likely related to CKD No evidence of blood loss Monitor CBC periodically  BMI: Estimated body mass index is 24.41 kg/m as calculated from the following:   Height as of this encounter: 5' (1.524 m).   Weight as of this encounter: 56.7 kg.   Code status:   Code Status: Full Code   DVT Prophylaxis: enoxaparin  (LOVENOX ) injection 30 mg Start: 01/11/23 2100   Family Communication: None at bedside   Disposition Plan: Status is: Observation The patient will require care spanning > 2 midnights and should be moved to inpatient because: Severity of illness   Planned Discharge Destination:Home health  Diet: Diet Order             Diet Heart Room service appropriate? Yes; Fluid consistency: Thin  Diet effective now                     Antimicrobial agents: Anti-infectives (From admission, onward)    None        MEDICATIONS: Scheduled Meds:  enoxaparin  (LOVENOX ) injection  30 mg Subcutaneous Q24H   gabapentin   400 mg Oral TID   insulin  aspart  0-9 Units Subcutaneous TID WC   insulin  glargine-yfgn  10 Units Subcutaneous BID   mirtazapine   30 mg Oral QHS    rosuvastatin   40 mg Oral Daily   sodium chloride  flush  3 mL Intravenous Q12H   Continuous Infusions:  sodium chloride  125 mL/hr at 01/13/23 0011   PRN Meds:.acetaminophen  **OR** acetaminophen , diclofenac  Sodium, hydrALAZINE    I have personally reviewed following labs and imaging studies  LABORATORY DATA: CBC: Recent Labs  Lab 01/11/23 0812 01/12/23 0457 01/13/23 0458  WBC 7.7 7.0 6.8  HGB 12.4 10.4* 11.5*  HCT 36.6 30.3* 34.9*  MCV 86.9 87.8 90.2  PLT 199 186 185    Basic Metabolic Panel: Recent Labs  Lab 01/11/23 0812 01/11/23 2151 01/12/23 0457 01/12/23 0904 01/13/23 0458  NA 144  --  142  --  143  K 3.8  --  3.4*  --  4.4  CL 105  --  107  --  113*  CO2 28  --  24  --  22  GLUCOSE 176*  --  111*  --  76  BUN 25*  --  34*  --  30*  CREATININE 1.36*  --  1.56*  --  1.39*  CALCIUM  9.1  --  8.1*  --  8.4*  MG  --  1.1*  --  1.2* 2.4    GFR: Estimated Creatinine Clearance: 26.3 mL/min (A) (by C-G formula based on SCr of 1.39 mg/dL (H)).  Liver Function Tests: Recent Labs  Lab 01/11/23 0812  AST 19  ALT 11  ALKPHOS 61  BILITOT 0.6  PROT 7.5  ALBUMIN 3.6   No results for input(s): LIPASE, AMYLASE in the last 168 hours. No results for input(s): AMMONIA in the last 168 hours.  Coagulation Profile: No results for input(s): INR, PROTIME in the last 168 hours.  Cardiac Enzymes: No results for input(s): CKTOTAL, CKMB, CKMBINDEX, TROPONINI in the last 168 hours.  BNP (last 3 results) No results for input(s): PROBNP in the last 8760 hours.  Lipid Profile: No results for input(s): CHOL, HDL, LDLCALC, TRIG, CHOLHDL, LDLDIRECT in the last 72 hours.  Thyroid  Function Tests: Recent Labs    01/11/23 1037  TSH 1.069    Anemia Panel: No results for input(s): VITAMINB12, FOLATE, FERRITIN, TIBC, IRON, RETICCTPCT in the last 72 hours.  Urine analysis:    Component Value Date/Time   COLORURINE STRAW (A)  01/11/2023 1030   APPEARANCEUR CLEAR 01/11/2023 1030   LABSPEC 1.014 01/11/2023 1030   PHURINE 7.0 01/11/2023 1030   GLUCOSEU NEGATIVE 01/11/2023 1030   HGBUR NEGATIVE 01/11/2023 1030   BILIRUBINUR NEGATIVE 01/11/2023 1030   KETONESUR NEGATIVE 01/11/2023 1030   PROTEINUR 100 (A) 01/11/2023 1030   UROBILINOGEN 1.0 09/23/2014 1458   NITRITE NEGATIVE 01/11/2023 1030   LEUKOCYTESUR NEGATIVE 01/11/2023 1030    Sepsis Labs: Lactic Acid, Venous    Component Value Date/Time   LATICACIDVEN 1.7 01/14/2021 1549    MICROBIOLOGY: Recent Results (from  the past 240 hours)  Resp panel by RT-PCR (RSV, Flu A&B, Covid) Anterior Nasal Swab     Status: None   Collection Time: 01/11/23  7:38 AM   Specimen: Anterior Nasal Swab  Result Value Ref Range Status   SARS Coronavirus 2 by RT PCR NEGATIVE NEGATIVE Final   Influenza A by PCR NEGATIVE NEGATIVE Final   Influenza B by PCR NEGATIVE NEGATIVE Final    Comment: (NOTE) The Xpert Xpress SARS-CoV-2/FLU/RSV plus assay is intended as an aid in the diagnosis of influenza from Nasopharyngeal swab specimens and should not be used as a sole basis for treatment. Nasal washings and aspirates are unacceptable for Xpert Xpress SARS-CoV-2/FLU/RSV testing.  Fact Sheet for Patients: bloggercourse.com  Fact Sheet for Healthcare Providers: seriousbroker.it  This test is not yet approved or cleared by the [place]  FDA and has been authorized for detection and/or diagnosis of SARS-CoV-2 by FDA under an Emergency Use Authorization (EUA). This EUA will remain in effect (meaning this test can be used) for the duration of the COVID-19 declaration under Section 564(b)(1) of the Act, 21 U.S.C. section 360bbb-3(b)(1), unless the authorization is terminated or revoked.     Resp Syncytial Virus by PCR NEGATIVE NEGATIVE Final    Comment: (NOTE) Fact Sheet for  Patients: bloggercourse.com  Fact Sheet for Healthcare Providers: seriousbroker.it  This test is not yet approved or cleared by the [place]  FDA and has been authorized for detection and/or diagnosis of SARS-CoV-2 by FDA under an Emergency Use Authorization (EUA). This EUA will remain in effect (meaning this test can be used) for the duration of the COVID-19 declaration under Section 564(b)(1) of the Act, 21 U.S.C. section 360bbb-3(b)(1), unless the authorization is terminated or revoked.  Performed at [facility], [address], [place], [place] [address]     RADIOLOGY STUDIES/RESULTS: ECHOCARDIOGRAM COMPLETE Result Date: 01/11/2023    ECHOCARDIOGRAM REPORT   Patient Name:   LORINE Aslin  Date of Exam: 01/11/2023 Medical Rec #:  [id number]  Height:       60.0 in Accession #:    [id number] Weight:       125.0 lb Date of Birth:  [birth year]   BSA:          1.529 m Patient Age:    77 years   BP:           147/77 mmHg Patient Gender: F          HR:           85 bpm. Exam Location:  Inpatient Procedure: 2D Echo, Cardiac Doppler and Color Doppler Indications:    Syncope R55  History:        Patient has prior history of Echocardiogram examinations, most                 recent 04/21/2019. Stroke and CKD, stage 3,                 Signs/Symptoms:Syncope; Risk Factors:Hypertension, Diabetes and                 Dyslipidemia.  Sonographer:    Thea Norlander RCS Referring Phys: [phone] RONDELL A SMITH IMPRESSIONS  1. Left ventricular ejection fraction, by estimation, is 60 to 65%. The left ventricle has normal function. The left ventricle has no regional wall motion abnormalities. Left ventricular diastolic parameters are consistent with Grade I diastolic dysfunction (impaired relaxation).  2. Right ventricular systolic function is normal. The right ventricular size is  normal. Tricuspid regurgitation signal is inadequate for assessing PA pressure.  3. The  mitral valve is grossly normal. Trivial mitral valve regurgitation.  4. The aortic valve has an indeterminant number of cusps. There is mild thickening of the aortic valve. Aortic valve regurgitation is mild.  5. The inferior vena cava is normal in size with greater than 50% respiratory variability, suggesting right atrial pressure of 3 mmHg. Comparison(s): Prior images reviewed side by side. LVEF remains normal range at 60-65%. FINDINGS  Left Ventricle: Left ventricular ejection fraction, by estimation, is 60 to 65%. The left ventricle has normal function. The left ventricle has no regional wall motion abnormalities. The left ventricular internal cavity size was normal in size. There is  no left ventricular hypertrophy. Left ventricular diastolic parameters are consistent with Grade I diastolic dysfunction (impaired relaxation). Right Ventricle: The right ventricular size is normal. No increase in right ventricular wall thickness. Right ventricular systolic function is normal. Tricuspid regurgitation signal is inadequate for assessing PA pressure. Left Atrium: Left atrial size was normal in size. Right Atrium: Right atrial size was normal in size. Pericardium: There is no evidence of pericardial effusion. Mitral Valve: The mitral valve is grossly normal. There is mild thickening of the mitral valve leaflet(s). Trivial mitral valve regurgitation. Tricuspid Valve: The tricuspid valve is grossly normal. Tricuspid valve regurgitation is mild. Aortic Valve: The aortic valve has an indeterminant number of cusps. There is mild thickening of the aortic valve. There is mild aortic valve annular calcification. Aortic valve regurgitation is mild. Aortic regurgitation PHT measures 1033 msec. Aortic valve peak gradient measures 8.6 mmHg. Pulmonic Valve: The pulmonic valve was not well visualized. Pulmonic valve regurgitation is trivial. Aorta: The aortic root and ascending aorta are structurally normal, with no evidence of  dilitation. Venous: The inferior vena cava is normal in size with greater than 50% respiratory variability, suggesting right atrial pressure of 3 mmHg. IAS/Shunts: No atrial level shunt detected by color flow Doppler.  LEFT VENTRICLE PLAX 2D LVIDd:         4.00 cm   Diastology LVIDs:         2.70 cm   LV e' medial:    5.66 cm/s LV PW:         0.90 cm   LV E/e' medial:  8.4 LV IVS:        0.80 cm   LV e' lateral:   8.70 cm/s LVOT diam:     1.70 cm   LV E/e' lateral: 5.5 LV SV:         37 LV SV Index:   25 LVOT Area:     2.27 cm  RIGHT VENTRICLE             IVC RV S prime:     12.50 cm/s  IVC diam: 1.20 cm TAPSE (M-mode): 1.7 cm LEFT ATRIUM           Index        RIGHT ATRIUM           Index LA Vol (A2C): 41.0 ml 26.82 ml/m  RA Area:     10.40 cm LA Vol (A4C): 27.7 ml 18.12 ml/m  RA Volume:   17.40 ml  11.38 ml/m  AORTIC VALVE AV Area (Vmax): 1.36 cm AV Vmax:        146.50 cm/s AV Peak Grad:   8.6 mmHg LVOT Vmax:      87.50 cm/s LVOT Vmean:     58.200 cm/s LVOT  VTI:       0.165 m AI PHT:         1033 msec  AORTA Ao Root diam: 3.40 cm Ao Asc diam:  2.20 cm MITRAL VALVE MV Area (PHT): 5.13 cm     SHUNTS MV Decel Time: 148 msec     Systemic VTI:  0.16 m MV E velocity: 47.70 cm/s   Systemic Diam: 1.70 cm MV A velocity: 113.00 cm/s MV E/A ratio:  0.42 Jayson Sierras MD Electronically signed by Jayson Sierras MD Signature Date/Time: 01/11/2023/6:03:59 PM    Final      LOS: 0 days   Donalda Applebaum, MD  [facility]    To contact the attending provider between 7A-7P or the covering provider during after hours 7P-7A, please log into the web site [email or web address] and access using universal [place] password for that web site. If you do not have the password, please call the hospital operator.  01/13/2023, 9:56 AM

## 2023-01-13 NOTE — Evaluation (Signed)
 Occupational Therapy Evaluation Patient Details Name: Nichole Munoz MRN: [id number] DOB: [birth year] Today's Date: 01/13/2023   History of Present Illness Patient is 78 y.o. female reported feeling weak and fainting while walking to the kitchen this morning. The next thing she recalls is someone pounding on her chest.  Patient reports generalized body aches, bil LE pain, and mild SOB. Pt has hxof prior Lt arm fracture that causes pain when the weather gets cold. Troponins neg x2, CT of chest neg for PE. Pt admitted for syncopal episode. PMH significant for DM, neuropathy, HTN, HLD, arthritis, CKD IIIa, CVA w/ Lt residual weakness, C7 fx in 2018.   Clinical Impression   Pt c/o generalized pain, mild. Interpreter [phone] utilized, Cambodian. Pt states she lives with her son/daughter, she is alone at some points during the day and typically makes meals and ambulates with/without cane, completes ADLs alone. Currently Pt requires CGA for mobility due to poor balance, her BP was elevated during session, frequent/jerky and involuntary movements throughout session may have affected BP reading. Pt would benefit from [facility] follow up to maximize safety in home environment, a shower chair for safety with bathing is recommended. Pt to be seen acutely to maximize functional independence and return to PLOF.        If plan is discharge home, recommend the following: A little help with walking and/or transfers;A little help with bathing/dressing/bathroom;Assistance with cooking/housework;Assist for transportation;Help with stairs or ramp for entrance    Functional Status Assessment  Patient has had a recent decline in their functional status and demonstrates the ability to make significant improvements in function in a reasonable and predictable amount of time.  Equipment Recommendations  Tub/shower seat    Recommendations for Other Services       Precautions / Restrictions Precautions Precautions:  Fall Restrictions Weight Bearing Restrictions Per Provider Order: No      Mobility Bed Mobility Overal bed mobility: Needs Assistance             General bed mobility comments: arrived in recliner    Transfers Overall transfer level: Needs assistance Equipment used: Rolling walker (2 wheels) Transfers: Sit to/from Stand Sit to Stand: Contact guard assist           General transfer comment: CGA for mobility with RW      Balance Overall balance assessment: Needs assistance Sitting-balance support: Feet supported, Bilateral upper extremity supported Sitting balance-Leahy Scale: Fair Sitting balance - Comments: EOB, recliner   Standing balance support: Bilateral upper extremity supported, During functional activity Standing balance-Leahy Scale: Poor Standing balance comment: reliant on RW for support                           ADL either performed or assessed with clinical judgement   ADL Overall ADL's : At baseline;Needs assistance/impaired                                       General ADL Comments: set up/supervision for ADLs, CGA for mobility with RW.     Vision         Perception         Praxis         Pertinent Vitals/Pain Pain Assessment Pain Assessment: 0-10 Pain Score: 3  Faces Pain Scale: Hurts a little bit Pain Location: generalized pain Pain Descriptors / Indicators: Discomfort Pain Intervention(s): Limited  activity within patient's tolerance, Monitored during session     Extremity/Trunk Assessment Upper Extremity Assessment Upper Extremity Assessment: Generalized weakness;RUE deficits/detail;LUE deficits/detail RUE Deficits / Details: frequent involuntary movements making FM difficult RUE Coordination: decreased fine motor LUE Deficits / Details: frequent involuntary movements making FM difficult LUE Coordination: decreased fine motor   Lower Extremity Assessment Lower Extremity Assessment: Defer to PT  evaluation       Communication Communication Communication: Other (comment) (Interpreter [phone] (Cambodian))   Cognition Arousal: Alert Behavior During Therapy: WFL for tasks assessed/performed, Restless Overall Cognitive Status: Difficult to assess                                 General Comments: alert, restless     General Comments  BP high during session, 138/112, Pt frequent movement and jerking LUE which may have affected multiple readings    Exercises     Shoulder Instructions      Home Living Family/patient expects to be discharged to:: Private residence Living Arrangements: Children Available Help at Discharge: Family Type of Home: Apartment Home Access: Level entry     Home Layout: Two level;Bed/bath upstairs;1/2 bath on main level;Able to live on main level with bedroom/bathroom Alternate Level Stairs-Number of Steps: 13 (pt has to take 3 breaks when climbing stairs to shower)   Bathroom Shower/Tub: Chief Strategy Officer: Standard Bathroom Accessibility: Yes   Home Equipment: [place] - single point   Additional Comments: son and daughter (son works night daughter during day)      Prior Functioning/Environment Prior Level of Function : Needs assist           ADLs (physical): Grooming;Bathing;Dressing Mobility Comments: uses SPC for mobility ADLs Comments: son/daughter assist with ADLs        OT Problem List: Decreased strength;Decreased activity tolerance;Impaired balance (sitting and/or standing);Decreased safety awareness;Decreased cognition;Decreased knowledge of use of DME or AE;Pain      OT Treatment/Interventions: Self-care/ADL training;Therapeutic exercise;Energy conservation;DME and/or AE instruction;Therapeutic activities;Patient/family education    OT Goals(Current goals can be found in the care plan section) Acute Rehab OT Goals Patient Stated Goal: to return home OT Goal Formulation: With patient Time For  Goal Achievement: 01/27/23 Potential to Achieve Goals: Good  OT Frequency: Min 1X/week    Co-evaluation              AM-PAC OT 6 Clicks Daily Activity     Outcome Measure Help from another person eating meals?: None Help from another person taking care of personal grooming?: A Little Help from another person toileting, which includes using toliet, bedpan, or urinal?: A Little Help from another person bathing (including washing, rinsing, drying)?: A Little Help from another person to put on and taking off regular upper body clothing?: A Little Help from another person to put on and taking off regular lower body clothing?: A Little 6 Click Score: 19   End of Session Equipment Utilized During Treatment: Gait belt;Rolling walker (2 wheels) Nurse Communication: Mobility status  Activity Tolerance: Patient tolerated treatment well Patient left: in chair;with call bell/phone within reach;with chair alarm set  OT Visit Diagnosis: Unsteadiness on feet (R26.81);Other abnormalities of gait and mobility (R26.89);Muscle weakness (generalized) (M62.81);Pain;Other symptoms and signs involving cognitive function                Time: 8872-8843 OT Time Calculation (min): 29 min Charges:  OT General Charges $OT Visit: 1 Visit OT  Evaluation $OT Eval Low Complexity: 1 Low OT Treatments $Self Care/Home Management : 8-22 mins  [place], OTR/L  Elouise JONELLE Bott 01/13/2023, 12:05 PM

## 2023-01-13 NOTE — Progress Notes (Signed)
 Assessment complete.  Denies desire to order breakfast at this time.  FSBS 59 mg/dL.  Patient alert, oriented to person only.  Patient drank 240 ml of orange juice without problem.  Will follow-up.

## 2023-01-13 NOTE — Plan of Care (Signed)

## 2023-01-13 NOTE — Progress Notes (Signed)
 Mobility Specialist Progress Note;   01/13/23 1525  Mobility  Activity Transferred to/from [facility]  Level of Assistance Contact guard assist, steadying assist  Assistive Device Other (Comment);BSC (HHA)  Distance Ambulated (ft) 3 ft  Activity Response Tolerated well  Mobility Referral Yes  Mobility visit 1 Mobility  Mobility Specialist Start Time (ACUTE ONLY) 1525  Mobility Specialist Stop Time (ACUTE ONLY) 1535  Mobility Specialist Time Calculation (min) (ACUTE ONLY) 10 min   Nursing requesting assistance w/ transferring pt from [facility] to bed. On BSC upon arrival, void successful. Required MinG assistance via HHA to transfer pt safely. Frequent involuntary movements from pt. No c/o during transfer. Pt left comfortably in bed with all needs met, alarm on. Visitor in room.   Lauraine Erm Mobility Specialist Please contact via SecureChat or [employer] [phone]

## 2023-01-13 NOTE — TOC CM/SW Note (Signed)
 Transition of Care [facility]) - Inpatient Brief Assessment   Patient Details  Name: Nichole Munoz MRN: [id number] Date of Birth: Sep 23, 1945  Transition of Care [facility]) CM/SW Contact:    Inocente GORMAN Kindle, LCSW Phone Number: 01/13/2023, 3:21 PM   Clinical Narrative: Patient admitted from home and speaks Cambodian. TOC following for home health and DME needs.    Transition of Care Asessment: Insurance and Status: Insurance coverage has been reviewed Patient has primary care physician: Yes Home environment has been reviewed: From home Prior level of function:: Mod Indep Prior/Current Home Services: No current home services Social Drivers of Health Review: SDOH reviewed no interventions necessary Readmission risk has been reviewed: Yes Transition of care needs: transition of care needs identified, TOC will continue to follow

## 2023-01-14 ENCOUNTER — Encounter: Payer: Self-pay | Admitting: *Deleted

## 2023-01-14 ENCOUNTER — Other Ambulatory Visit: Payer: Self-pay | Admitting: Student

## 2023-01-14 ENCOUNTER — Other Ambulatory Visit (HOSPITAL_COMMUNITY): Payer: Self-pay

## 2023-01-14 DIAGNOSIS — R55 Syncope and collapse: Secondary | ICD-10-CM

## 2023-01-14 DIAGNOSIS — Z794 Long term (current) use of insulin: Secondary | ICD-10-CM | POA: Diagnosis not present

## 2023-01-14 DIAGNOSIS — N1831 Chronic kidney disease, stage 3a: Secondary | ICD-10-CM | POA: Diagnosis not present

## 2023-01-14 DIAGNOSIS — E1165 Type 2 diabetes mellitus with hyperglycemia: Secondary | ICD-10-CM | POA: Diagnosis not present

## 2023-01-14 DIAGNOSIS — I160 Hypertensive urgency: Secondary | ICD-10-CM | POA: Diagnosis not present

## 2023-01-14 LAB — GLUCOSE, CAPILLARY: Glucose-Capillary: 105 mg/dL — ABNORMAL HIGH (ref 70–99)

## 2023-01-14 MED ORDER — AMLODIPINE BESYLATE 5 MG PO TABS
5.0000 mg | ORAL_TABLET | Freq: Every day | ORAL | 1 refills | Status: DC
  Filled 2023-01-14: qty 30, 30d supply, fill #0

## 2023-01-14 MED ORDER — INSULIN GLARGINE 100 UNIT/ML ~~LOC~~ SOLN: 10.0000 [IU] | Freq: Every day | SUBCUTANEOUS | Status: DC

## 2023-01-14 NOTE — Progress Notes (Signed)
 Ordered a 30 day Event Monitor for further evaluation of syncope at the request of Dr. Raenelle (Internal Medicine). Patient has not been seen by our office since 2021 so will also arrange a New Patient visit with Dr. Ladona (DOD today) in about 6-8 weeks to review results per office protocol.  Heatherly Stenner E Lakeishia [place], PA-C 01/14/2023 10:09 AM

## 2023-01-14 NOTE — Progress Notes (Signed)
 Discharge instructions given. Patient verbalized understanding and all questions were answered.  ?

## 2023-01-14 NOTE — Progress Notes (Signed)
 Physical Therapy Treatment Patient Details Name: Nichole Munoz MRN: [id number] DOB: 16-Mar-1945 Today's Date: 01/14/2023   History of Present Illness Patient is 78 y.o. female reported feeling weak and fainting while walking to the kitchen this morning. The next thing she recalls is someone pounding on her chest.  Patient reports generalized body aches, bil LE pain, and mild SOB. Pt has hxof prior Lt arm fracture that causes pain when the weather gets cold. Troponins neg x2, CT of chest neg for PE. Pt admitted for syncopal episode. PMH significant for DM, neuropathy, HTN, HLD, arthritis, CKD IIIa, CVA w/ Lt residual weakness, C7 fx in 2018.    PT Comments  Pt received in supine and agreeable to session. Pt demonstrates quick movements and some tremors during mobility tasks causing increased instability and risk of falling. Pt requires intermittent min A for balance during ambulation and cues for RW management due to drifting and proximity.  Pt and family educated that pt will require assist and use of RW with all mobility due to instability and fall risk. Pt continues to benefit from PT services to progress toward functional mobility goals.    If plan is discharge home, recommend the following: A lot of help with walking and/or transfers;A lot of help with bathing/dressing/bathroom;Assistance with cooking/housework;Assist for transportation;Help with stairs or ramp for entrance   Can travel by private vehicle        Equipment Recommendations       Recommendations for Other Services       Precautions / Restrictions Precautions Precautions: Fall Restrictions Weight Bearing Restrictions Per Provider Order: No     Mobility  Bed Mobility Overal bed mobility: Needs Assistance Bed Mobility: Supine to Sit, Sit to Supine     Supine to sit: Supervision, HOB elevated Sit to supine: Supervision   General bed mobility comments: increased effort    Transfers Overall transfer level: Needs  assistance Equipment used: Rolling walker (2 wheels) Transfers: Sit to/from Stand Sit to Stand: Contact guard assist           General transfer comment: CGA for safety due to slight impulsivity and instability    Ambulation/Gait Ambulation/Gait assistance: Min assist Gait Distance (Feet): 40 Feet (x2) Assistive device: Rolling walker (2 wheels) Gait Pattern/deviations: Step-through pattern, Decreased stride length, Drifts right/left, Staggering right, Staggering left Gait velocity: decr     General Gait Details: Pt demonstrates quick gait with poor RW management due to tremors. Cues for RW proximity and pacing. Min A for balance intermittently due to LOB       Balance Overall balance assessment: Needs assistance Sitting-balance support: Feet supported, Bilateral upper extremity supported Sitting balance-Leahy Scale: Good Sitting balance - Comments: sitting EOB   Standing balance support: Bilateral upper extremity supported, During functional activity Standing balance-Leahy Scale: Poor Standing balance comment: increased instability due to tremors and slight impulsivity                            Cognition Arousal: Alert Behavior During Therapy: WFL for tasks assessed/performed, Restless Overall Cognitive Status: Difficult to assess                                          Exercises      General Comments        Pertinent Vitals/Pain Pain Assessment Pain Assessment: Faces Faces Pain  Scale: Hurts little more Pain Location: B hips Pain Descriptors / Indicators: Discomfort Pain Intervention(s): Monitored during session, Limited activity within patient's tolerance     PT Goals (current goals can now be found in the care plan section) Acute Rehab PT Goals Patient Stated Goal: get better PT Goal Formulation: With patient Time For Goal Achievement: 01/26/23 Progress towards PT goals: Progressing toward goals    Frequency    Min  1X/week       AM-PAC PT 6 Clicks Mobility   Outcome Measure  Help needed turning from your back to your side while in a flat bed without using bedrails?: None Help needed moving from lying on your back to sitting on the side of a flat bed without using bedrails?: A Little Help needed moving to and from a bed to a chair (including a wheelchair)?: A Little Help needed standing up from a chair using your arms (e.g., wheelchair or bedside chair)?: A Little Help needed to walk in hospital room?: A Little Help needed climbing 3-5 steps with a railing? : A Little 6 Click Score: 19    End of Session Equipment Utilized During Treatment: Gait belt Activity Tolerance: Patient tolerated treatment well;Patient limited by pain Patient left: in bed;with call bell/phone within reach;with family/visitor present;with bed alarm set Nurse Communication: Mobility status PT Visit Diagnosis: Muscle weakness (generalized) (M62.81);Difficulty in walking, not elsewhere classified (R26.2);Other abnormalities of gait and mobility (R26.89);Other symptoms and signs involving the nervous system (R29.898);Hemiplegia and hemiparesis     Time: 8944-8880 PT Time Calculation (min) (ACUTE ONLY): 24 min  Charges:    $Gait Training: 8-22 mins $Therapeutic Activity: 8-22 mins PT General Charges $$ ACUTE PT VISIT: 1 Visit                    Darryle George, PTA Acute Rehabilitation Services Secure Chat Preferred  [phone]    Darryle George 01/14/2023, 12:05 PM

## 2023-01-14 NOTE — TOC Transition Note (Signed)
 Transition of Care [facility]) - Discharge Note   Patient Details  Name: Nichole Munoz MRN: [id number] Date of Birth: [birth year]  [facility] CM/SW Contact:  Hendricks KANDICE Her, RN Phone Number: 01/14/2023, 9:46 AM   Clinical Narrative:     Patient will DC to home today. Home Health PT and OT have been recommended and will be provided by [facility]. A BSC will be delivered bedside shortly by [employer]. Family to transport home. AVS updated    No additional TOC needs           Patient Goals and CMS Choice            Discharge Placement                       Discharge Plan and Services Additional resources added to the After Visit Summary for                                       Social Drivers of Health (SDOH) Interventions SDOH Screenings   Food Insecurity: Patient Declined (01/13/2023)  Housing: Patient Unable To Answer (01/13/2023)  Transportation Needs: No Transportation Needs (01/13/2023)  Utilities: Patient Unable To Answer (01/13/2023)  Financial Resource Strain: Not on File (04/25/2021)   Received from [place], [place]  Physical Activity: Not on File (04/25/2021)   Received from [place], [place]  Social Connections: Unknown (01/13/2023)  Stress: Not on File (04/25/2021)   Received from [place], [place]  Tobacco Use: Low Risk  (01/11/2023)     Readmission Risk Interventions     No data to display

## 2023-01-14 NOTE — Progress Notes (Signed)
 Patient enrolled for [employer]/ [employer] to ship a 30 day cardiac event monitor to her home.  Letter with instructions mailed to patient.  Dr. Jacinto Halim to read.

## 2023-01-14 NOTE — Discharge Summary (Signed)
 PATIENT DETAILS Name: Nichole Munoz Age: 78 y.o. Sex: female Date of Birth: 09-Jun-1945 MRN: [id number]. Admitting Physician: Maximino DELENA Sharps, MD PCP:[facility]  Admit Date: 01/11/2023 Discharge date: 01/14/2023  Recommendations for Outpatient Follow-up:  Follow up with PCP in 1-2 weeks Please obtain CMP/CBC in one week  Admitted From:  Home  Disposition: Home health   Discharge Condition: good  CODE STATUS:   Code Status: Full Code   Diet recommendation:  Diet Order             Diet - low sodium heart healthy           Diet Carb Modified           Diet Heart Room service appropriate? Yes; Fluid consistency: Thin  Diet effective now                    Brief Summary: Patient is a 78 y.o.  female with history of CVA-mild left residual deficit, CKD stage IIIa, DM-2, HTN, HLD-who presented with a syncopal episode.   Significant events: 1/5>> admit to [facility]   Significant studies: 1/5>> CT head: No acute intracranial abnormality 1/5>> CTA chest: No acute finding-no PE 1/5>> echo: EF 60-65%, grade 1 diastolic dysfunction   Significant microbiology data: 1/5>> COVID/influenza/RSV PCR: Negative   Procedures: None   Consults: None  Brief Hospital Course: Syncope Possibly vasovagal-as this happened after a vomiting episode. Telemetry negative for significant arrhythmias Echo with stable EF CTA chest negative for PE Mobilize with PT/OT-recommendations are for home health-which we will order. I have sent epic message to cardiology to see if we can set her up for outpatient heart monitoring.   HTN Antihypertensives held 1/6 as blood pressure was noted to be soft However blood pressure slowly started creeping up-she was subsequently started on low-dose amlodipine  with stable BP readings Lisinopril  remains on hold-PCP to reassess and resume accordingly.   HLD Statin   DM-2 (A1c 7.7 on 01/12/2023) Had a hypoglycemic episode on  1/7 Semglee  subsequently changed to daily dosing-seems to have stable CBG readings Resume metformin  on discharge Continue close outpatient monitoring.  History of CVA-mild left-sided residual weakness Per son at bedside-ambulates with the help of a walker/cane Has chronic debility PT/OT eval appreciated-plans are for home health on discharge.   CKD stage IIIa Close to baseline PCP to follow periodically   Normocytic anemia Mild Likely related to CKD No evidence of blood loss Monitor CBC periodically   BMI: Estimated body mass index is 24.41 kg/m as calculated from the following:   Height as of this encounter: 5' (1.524 m).   Weight as of this encounter: 56.7 kg.    Discharge Diagnoses:  Principal Problem:   Syncope Active Problems:   Hypertensive urgency   Uncontrolled type 2 diabetes mellitus with hyperglycemia, with long-term current use of insulin  (HCC)   Stage 3a chronic kidney disease (CKD) (HCC) - baseline SCr 1.4   History of CVA with residual deficit   Hyperlipidemia   Discharge Instructions:  Activity:  As tolerated with Full fall precautions use walker/cane & assistance as needed  Discharge Instructions     Call MD for:  extreme fatigue   Complete by: As directed    Call MD for:  persistant dizziness or light-headedness   Complete by: As directed    Diet - low sodium heart healthy   Complete by: As directed    Diet Carb Modified   Complete by: As directed  Increase activity slowly   Complete by: As directed       Allergies as of 01/14/2023       Reactions   Other Other (See Comments)   Chicken causes itching/  clorox, Bleach causes shortness of breath   Chicken Allergy    No Known Allergies    Previous listing of unable to tolerate SMELL of Clorox and Chicken REACTION NOT AN ALLERGY         Medication List     STOP taking these medications    doxycycline  100 MG capsule Commonly known as: VIBRAMYCIN    lisinopril  10 MG  tablet Commonly known as: ZESTRIL        TAKE these medications    acetaminophen  500 MG tablet Commonly known as: TYLENOL  Take 1,000 mg by mouth every 6 (six) hours as needed for mild pain.   albuterol  108 (90 Base) MCG/ACT inhaler Commonly known as: VENTOLIN  HFA Inhale 2 puffs into the lungs every 6 (six) hours as needed for wheezing or shortness of breath.   amLODipine  5 MG tablet Commonly known as: NORVASC  Take 1 tablet (5 mg total) by mouth daily.   blood glucose meter kit and supplies Dispense based on patient and insurance preference. Use up to four times daily as directed. (FOR ICD-10 E10.9, E11.9).   diclofenac  Sodium 1 % Gel Commonly known as: Voltaren  Apply 2 g topically 4 (four) times daily. What changed:  when to take this reasons to take this   feeding supplement Liqd Take 237 mLs by mouth 2 (two) times daily between meals.   gabapentin  400 MG capsule Commonly known as: NEURONTIN  Take 400 mg by mouth 3 (three) times daily.   insulin  aspart 100 UNIT/ML FlexPen Commonly known as: NOVOLOG  Inject 1-9 Units into the skin 4 (four) times daily -  before meals and at bedtime. Glucose 121 - 150: 1 unit, Glucose 151 - 200: 2 units, Glucose 201 - 250: 3 units, Glucose 251 - 300: 5 units, Glucose 301 - 350: 7 units, Glucose 351 - 400: 9 units, Glucose > 400 call MD   insulin  glargine 100 UNIT/ML injection Commonly known as: LANTUS  Inject 0.1 mLs (10 Units total) into the skin daily. What changed:  how much to take when to take this   Insulin  Pen Needle 31G X 5 MM Misc Use up to 4 times daily with insulin    metFORMIN  500 MG tablet Commonly known as: GLUCOPHAGE  Take 1,000 mg by mouth 2 (two) times daily with a meal.   mirtazapine  30 MG tablet Commonly known as: REMERON  Take 30 mg by mouth at bedtime.   rosuvastatin  40 MG tablet Commonly known as: CRESTOR  Take 40 mg by mouth daily.        Follow-up Information     [facility]. Schedule an appointment as soon as possible for a visit in 1 week(s).   Specialty: Pediatrics Contact information: [address] [place] [address] [phone]                Allergies  Allergen Reactions   Other Other (See Comments)    Chicken causes itching/  clorox, Bleach causes shortness of breath   Chicken Allergy    No Known Allergies     Previous listing of unable to tolerate SMELL of Clorox and Chicken REACTION NOT AN ALLERGY      Other Procedures/Studies: ECHOCARDIOGRAM COMPLETE Result Date: 01/11/2023    ECHOCARDIOGRAM REPORT   Patient Name:   [facility]  [place]  Date of Exam: 01/11/2023 Medical Rec #:  [id number]  Height:       60.0 in Accession #:    [id number] Weight:       125.0 lb Date of Birth:  1945/11/26   BSA:          1.529 m Patient Age:    77 years   BP:           147/77 mmHg Patient Gender: F          HR:           85 bpm. Exam Location:  Inpatient Procedure: 2D Echo, Cardiac Doppler and Color Doppler Indications:    Syncope R55  History:        Patient has prior history of Echocardiogram examinations, most                 recent 04/21/2019. Stroke and CKD, stage 3,                 Signs/Symptoms:Syncope; Risk Factors:Hypertension, Diabetes and                 Dyslipidemia.  Sonographer:    Thea Norlander RCS Referring Phys: [id number] RONDELL A SMITH IMPRESSIONS  1. Left ventricular ejection fraction, by estimation, is 60 to 65%. The left ventricle has normal function. The left ventricle has no regional wall motion abnormalities. Left ventricular diastolic parameters are consistent with Grade I diastolic dysfunction (impaired relaxation).  2. Right ventricular systolic function is normal. The right ventricular size is normal. Tricuspid regurgitation signal is inadequate for assessing PA pressure.  3. The mitral valve is grossly normal. Trivial mitral valve regurgitation.  4. The aortic valve has an indeterminant number of cusps. There is mild thickening of the  aortic valve. Aortic valve regurgitation is mild.  5. The inferior vena cava is normal in size with greater than 50% respiratory variability, suggesting right atrial pressure of 3 mmHg. Comparison(s): Prior images reviewed side by side. LVEF remains normal range at 60-65%. FINDINGS  Left Ventricle: Left ventricular ejection fraction, by estimation, is 60 to 65%. The left ventricle has normal function. The left ventricle has no regional wall motion abnormalities. The left ventricular internal cavity size was normal in size. There is  no left ventricular hypertrophy. Left ventricular diastolic parameters are consistent with Grade I diastolic dysfunction (impaired relaxation). Right Ventricle: The right ventricular size is normal. No increase in right ventricular wall thickness. Right ventricular systolic function is normal. Tricuspid regurgitation signal is inadequate for assessing PA pressure. Left Atrium: Left atrial size was normal in size. Right Atrium: Right atrial size was normal in size. Pericardium: There is no evidence of pericardial effusion. Mitral Valve: The mitral valve is grossly normal. There is mild thickening of the mitral valve leaflet(s). Trivial mitral valve regurgitation. Tricuspid Valve: The tricuspid valve is grossly normal. Tricuspid valve regurgitation is mild. Aortic Valve: The aortic valve has an indeterminant number of cusps. There is mild thickening of the aortic valve. There is mild aortic valve annular calcification. Aortic valve regurgitation is mild. Aortic regurgitation PHT measures 1033 msec. Aortic valve peak gradient measures 8.6 mmHg. Pulmonic Valve: The pulmonic valve was not well visualized. Pulmonic valve regurgitation is trivial. Aorta: The aortic root and ascending aorta are structurally normal, with no evidence of dilitation. Venous: The inferior vena cava is normal in size with greater than 50% respiratory variability, suggesting right atrial pressure of 3 mmHg. IAS/Shunts:  No atrial level shunt detected  by color flow Doppler.  LEFT VENTRICLE PLAX 2D LVIDd:         4.00 cm   Diastology LVIDs:         2.70 cm   LV e' medial:    5.66 cm/s LV PW:         0.90 cm   LV E/e' medial:  8.4 LV IVS:        0.80 cm   LV e' lateral:   8.70 cm/s LVOT diam:     1.70 cm   LV E/e' lateral: 5.5 LV SV:         37 LV SV Index:   25 LVOT Area:     2.27 cm  RIGHT VENTRICLE             IVC RV S prime:     12.50 cm/s  IVC diam: 1.20 cm TAPSE (M-mode): 1.7 cm LEFT ATRIUM           Index        RIGHT ATRIUM           Index LA Vol (A2C): 41.0 ml 26.82 ml/m  RA Area:     10.40 cm LA Vol (A4C): 27.7 ml 18.12 ml/m  RA Volume:   17.40 ml  11.38 ml/m  AORTIC VALVE AV Area (Vmax): 1.36 cm AV Vmax:        146.50 cm/s AV Peak Grad:   8.6 mmHg LVOT Vmax:      87.50 cm/s LVOT Vmean:     58.200 cm/s LVOT VTI:       0.165 m AI PHT:         1033 msec  AORTA Ao Root diam: 3.40 cm Ao Asc diam:  2.20 cm MITRAL VALVE MV Area (PHT): 5.13 cm     SHUNTS MV Decel Time: 148 msec     Systemic VTI:  0.16 m MV E velocity: 47.70 cm/s   Systemic Diam: 1.70 cm MV A velocity: 113.00 cm/s MV E/A ratio:  0.42 Jayson Sierras MD Electronically signed by Jayson Sierras MD Signature Date/Time: 01/11/2023/6:03:59 PM    Final    CT Angio Chest PE W and/or Wo Contrast Result Date: 01/11/2023 CLINICAL DATA:  Pulmonary embolism suspected, low to intermediate probability with positive D-dimer. EXAM: CT ANGIOGRAPHY CHEST WITH CONTRAST TECHNIQUE: Multidetector CT imaging of the chest was performed using the standard protocol during bolus administration of intravenous contrast. Multiplanar CT image reconstructions and MIPs were obtained to evaluate the vascular anatomy. RADIATION DOSE REDUCTION: This exam was performed according to the departmental dose-optimization program which includes automated exposure control, adjustment of the mA and/or kV according to patient size and/or use of iterative reconstruction technique. CONTRAST:  60mL  OMNIPAQUE  IOHEXOL  350 MG/ML SOLN COMPARISON:  06/04/2022 FINDINGS: Cardiovascular: Satisfactory opacification of the pulmonary arteries to the segmental level. No evidence of pulmonary embolism when allowing for artifact from multilevel motion. Normal heart size. No pericardial effusion. Mediastinum/Nodes: Calcified right hilar lymph nodes, also seen on prior and attributed to remote granulomatous disease. Scattered atheromatous calcification of the aorta and coronaries. Lungs/Pleura: There is no edema, consolidation, effusion, or pneumothorax. Mild subpleural scarring in the medial right lower lobe. Upper Abdomen: Cholelithiasis. Musculoskeletal: No acute or aggressive finding Review of the MIP images confirms the above findings. IMPRESSION: 1. Negative for pulmonary embolism or other acute finding. 2. Atherosclerosis and cholelithiasis. Electronically Signed   By: Dorn Roulette M.D.   On: 01/11/2023 09:49   DG Chest Portable 1 View  Result Date: 01/11/2023 CLINICAL DATA:  Vomiting. EXAM: PORTABLE CHEST 1 VIEW COMPARISON:  06/04/2022 FINDINGS: Artifact from EKG leads. Normal heart size and mediastinal contours. No acute infiltrate or edema. No effusion or pneumothorax. No acute osseous findings. IMPRESSION: No active disease. Electronically Signed   By: Dorn Roulette M.D.   On: 01/11/2023 08:19   CT Head Wo Contrast Result Date: 01/11/2023 CLINICAL DATA:  78 year old female with history of altered mental status. Syncope. EXAM: CT HEAD WITHOUT CONTRAST TECHNIQUE: Contiguous axial images were obtained from the base of the skull through the vertex without intravenous contrast. RADIATION DOSE REDUCTION: This exam was performed according to the departmental dose-optimization program which includes automated exposure control, adjustment of the mA and/or kV according to patient size and/or use of iterative reconstruction technique. COMPARISON:  PET-CT 06/04/2022. FINDINGS: Brain: Mild cerebral atrophy. Patchy and  confluent areas of decreased attenuation are noted throughout the deep and periventricular white matter of the cerebral hemispheres bilaterally, compatible with chronic microvascular ischemic disease. Cavum septum pellucidum (normal anatomical variant) incidentally noted. No evidence of acute infarction, hemorrhage, hydrocephalus, extra-axial collection or mass lesion/mass effect. Vascular: No hyperdense vessel or unexpected calcification. Skull: Normal. Negative for fracture or focal lesion. Sinuses/Orbits: No acute finding. Other: None. IMPRESSION: 1. No evidence of significant acute traumatic injury to the skull or brain. 2. Mild cerebral atrophy with chronic microvascular ischemic changes in the cerebral white matter, as above. Electronically Signed   By: Toribio Aye M.D.   On: 01/11/2023 08:02     TODAY-DAY OF DISCHARGE:  Subjective:   Nichole Munoz today has no headache,no chest abdominal pain,no new weakness tingling or numbness, feels much better wants to go home today.   Objective:   Blood pressure (!) 158/69, pulse 77, temperature 98.4 F (36.9 C), temperature source Oral, resp. rate 16, height 5' (1.524 m), weight 56.7 kg, SpO2 96%.  Intake/Output Summary (Last 24 hours) at 01/14/2023 0940 Last data filed at 01/14/2023 0514 Gross per 24 hour  Intake --  Output 700 ml  Net -700 ml   Filed Weights   01/11/23 0730  Weight: 56.7 kg    Exam: Awake Alert, Oriented *3, No new F.N deficits, Normal affect [place].AT,PERRAL Supple Neck,No JVD, No cervical lymphadenopathy appriciated.  Symmetrical Chest wall movement, Good air movement bilaterally, CTAB RRR,No Gallops,Rubs or new Murmurs, No Parasternal Heave +ve B.Sounds, Abd Soft, Non tender, No organomegaly appriciated, No rebound -guarding or rigidity. No Cyanosis, Clubbing or edema, No new Rash or bruise   PERTINENT RADIOLOGIC STUDIES: No results found.   PERTINENT LAB RESULTS: CBC: Recent Labs    01/12/23 0457  01/13/23 0458  WBC 7.0 6.8  HGB 10.4* 11.5*  HCT 30.3* 34.9*  PLT 186 185   CMET CMP     Component Value Date/Time   NA 143 01/13/2023 0458   K 4.4 01/13/2023 0458   CL 113 (H) 01/13/2023 0458   CO2 22 01/13/2023 0458   GLUCOSE 76 01/13/2023 0458   BUN 30 (H) 01/13/2023 0458   CREATININE 1.39 (H) 01/13/2023 0458   CALCIUM  8.4 (L) 01/13/2023 0458   PROT 7.5 01/11/2023 0812   ALBUMIN 3.6 01/11/2023 0812   AST 19 01/11/2023 0812   ALT 11 01/11/2023 0812   ALKPHOS 61 01/11/2023 0812   BILITOT 0.6 01/11/2023 0812   GFRNONAA 39 (L) 01/13/2023 0458    GFR Estimated Creatinine Clearance: 26.3 mL/min (A) (by C-G formula based on SCr of 1.39 mg/dL (H)). No results for input(s): LIPASE, AMYLASE  in the last 72 hours. No results for input(s): CKTOTAL, CKMB, CKMBINDEX, TROPONINI in the last 72 hours. Invalid input(s): POCBNP No results for input(s): DDIMER in the last 72 hours. Recent Labs    01/12/23 0457  HGBA1C 7.7*   No results for input(s): CHOL, HDL, LDLCALC, TRIG, CHOLHDL, LDLDIRECT in the last 72 hours. Recent Labs    01/11/23 1037  TSH 1.069   No results for input(s): VITAMINB12, FOLATE, FERRITIN, TIBC, IRON, RETICCTPCT in the last 72 hours. Coags: No results for input(s): INR in the last 72 hours.  Invalid input(s): PT Microbiology: Recent Results (from the past 240 hours)  Resp panel by RT-PCR (RSV, Flu A&B, Covid) Anterior Nasal Swab     Status: None   Collection Time: 01/11/23  7:38 AM   Specimen: Anterior Nasal Swab  Result Value Ref Range Status   SARS Coronavirus 2 by RT PCR NEGATIVE NEGATIVE Final   Influenza A by PCR NEGATIVE NEGATIVE Final   Influenza B by PCR NEGATIVE NEGATIVE Final    Comment: (NOTE) The Xpert Xpress SARS-CoV-2/FLU/RSV plus assay is intended as an aid in the diagnosis of influenza from Nasopharyngeal swab specimens and should not be used as a sole basis for treatment. Nasal washings  and aspirates are unacceptable for Xpert Xpress SARS-CoV-2/FLU/RSV testing.  Fact Sheet for Patients: bloggercourse.com  Fact Sheet for Healthcare Providers: seriousbroker.it  This test is not yet approved or cleared by the [place]  FDA and has been authorized for detection and/or diagnosis of SARS-CoV-2 by FDA under an Emergency Use Authorization (EUA). This EUA will remain in effect (meaning this test can be used) for the duration of the COVID-19 declaration under Section 564(b)(1) of the Act, 21 U.S.C. section 360bbb-3(b)(1), unless the authorization is terminated or revoked.     Resp Syncytial Virus by PCR NEGATIVE NEGATIVE Final    Comment: (NOTE) Fact Sheet for Patients: bloggercourse.com  Fact Sheet for Healthcare Providers: seriousbroker.it  This test is not yet approved or cleared by the [place]  FDA and has been authorized for detection and/or diagnosis of SARS-CoV-2 by FDA under an Emergency Use Authorization (EUA). This EUA will remain in effect (meaning this test can be used) for the duration of the COVID-19 declaration under Section 564(b)(1) of the Act, 21 U.S.C. section 360bbb-3(b)(1), unless the authorization is terminated or revoked.  Performed at [facility], [address] [place], [place] [address]     FURTHER DISCHARGE INSTRUCTIONS:  Get Medicines reviewed and adjusted: Please take all your medications with you for your next visit with your Primary MD  Laboratory/radiological data: Please request your Primary MD to go over all hospital tests and procedure/radiological results at the follow up, please ask your Primary MD to get all Hospital records sent to his/her office.  In some cases, they will be blood work, cultures and biopsy results pending at the time of your discharge. Please request that your primary care M.D. goes through  all the records of your hospital data and follows up on these results.  Also Note the following: If you experience worsening of your admission symptoms, develop shortness of breath, life threatening emergency, suicidal or homicidal thoughts you must seek medical attention immediately by calling 911 or calling your MD immediately  if symptoms less severe.  You must read complete instructions/literature along with all the possible adverse reactions/side effects for all the Medicines you take and that have been prescribed to you. Take any new Medicines after you have completely understood  and accpet all the possible adverse reactions/side effects.   Do not drive when taking Pain medications or sleeping medications (Benzodaizepines)  Do not take more than prescribed Pain, Sleep and Anxiety Medications. It is not advisable to combine anxiety,sleep and pain medications without talking with your primary care practitioner  Special Instructions: If you have smoked or chewed Tobacco  in the last 2 yrs please stop smoking, stop any regular Alcohol   and or any Recreational drug use.  Wear Seat belts while driving.  Please note: You were cared for by a hospitalist during your hospital stay. Once you are discharged, your primary care physician will handle any further medical issues. Please note that NO REFILLS for any discharge medications will be authorized once you are discharged, as it is imperative that you return to your primary care physician (or establish a relationship with a primary care physician if you do not have one) for your post hospital discharge needs so that they can reassess your need for medications and monitor your lab values.  Total Time spent coordinating discharge including counseling, education and face to face time equals greater than 30 minutes.  SignedBETHA Donalda Applebaum 01/14/2023 9:40 AM

## 2023-02-24 ENCOUNTER — Encounter: Payer: Self-pay | Admitting: *Deleted

## 2023-02-27 ENCOUNTER — Ambulatory Visit: Payer: 59 | Attending: Cardiology | Admitting: Cardiology

## 2023-02-27 NOTE — Progress Notes (Deleted)
 Cardiology Office Note:  .   Date:  02/27/2023  ID:  Nichole Munoz, DOB Jul 05, 1945, MRN [id number] PCP: Inc, [facility]  [facility] Providers Cardiologist:  None { Click to update primary MD,subspecialty MD or APP then REFRESH:1}  History of Present Illness: .   Nichole Munoz is a 78 y.o. African-American female patient with hypertension, diabetes mellitus with stage IIIa chronic kidney disease, hypercholesterolemia, history of stroke in 2021 admitted to the hospital on 01/11/2023 with syncope and discharged 3 days later, echocardiogram revealing preserved EF, no atrial fibrillation on telemetry, CT of the head old stroke and CTA of the chest revealing no evidence of pulmonary embolism.  She now presents to establish care.  Discussed the use of AI scribe software for clinical note transcription with the patient, who gave verbal consent to proceed.  History of Present Illness            Labs   Lab Results  Component Value Date   CHOL 273 (H) 04/21/2019   HDL 43 04/21/2019   LDLCALC 158 (H) 04/21/2019   TRIG 359 (H) 04/21/2019   CHOLHDL 6.3 04/21/2019   Lab Results  Component Value Date   NA 143 01/13/2023   K 4.4 01/13/2023   CO2 22 01/13/2023   GLUCOSE 76 01/13/2023   BUN 30 (H) 01/13/2023   CREATININE 1.39 (H) 01/13/2023   CALCIUM 8.4 (L) 01/13/2023   GFRNONAA 39 (L) 01/13/2023      Latest Ref Rng & Units 01/13/2023    4:58 AM 01/12/2023    4:57 AM 01/11/2023    8:12 AM  BMP  Glucose 70 - 99 mg/dL 76  086  578   BUN 8 - 23 mg/dL 30  34  25   Creatinine 0.44 - 1.00 mg/dL 4.69  6.29  5.28   Sodium 135 - 145 mmol/L 143  142  144   Potassium 3.5 - 5.1 mmol/L 4.4  3.4  3.8   Chloride 98 - 111 mmol/L 113  107  105   CO2 22 - 32 mmol/L 22  24  28    Calcium 8.9 - 10.3 mg/dL 8.4  8.1  9.1       Latest Ref Rng & Units 01/13/2023    4:58 AM 01/12/2023    4:57 AM 01/11/2023    8:12 AM  CBC  WBC 4.0 - 10.5 K/uL 6.8  7.0  7.7   Hemoglobin 12.0 - 15.0 g/dL  41.3  24.4  01.0   Hematocrit 36.0 - 46.0 % 34.9  30.3  36.6   Platelets 150 - 400 K/uL 185  186  199    Lab Results  Component Value Date   HGBA1C 7.7 (H) 01/12/2023    Lab Results  Component Value Date   TSH 1.069 01/11/2023    External Labs:  ***  ***ROS Physical Exam:   VS:  There were no vitals taken for this visit.   Wt Readings from Last 3 Encounters:  01/11/23 125 lb (56.7 kg)  06/04/22 125 lb (56.7 kg)  07/28/21 125 lb (56.7 kg)     ***Physical Exam Studies Reviewed: .    CT angiogram chest 01/11/2023: Cardiovascular: Satisfactory opacification of the pulmonary arteries to the segmental level. No evidence of pulmonary embolism when allowing for artifact from multilevel motion. Normal heart size. No pericardial effusion. No pulmonary embolism, mild subpleural scarring in the medial right lower lobe.  ECHOCARDIOGRAM COMPLETE 01/11/2023  1. Left ventricular ejection fraction, by  estimation, is 60 to 65%. The left ventricle has normal function. The left ventricle has no regional wall motion abnormalities. Left ventricular diastolic parameters are consistent with Grade I diastolic dysfunction (impaired relaxation). 2. Right ventricular systolic function is normal. The right ventricular size is normal. Tricuspid regurgitation signal is inadequate for assessing PA pressure. 3. The mitral valve is grossly normal. Trivial mitral valve regurgitation. 4. The aortic valve has an indeterminant number of cusps. There is mild thickening of the aortic valve. Aortic valve regurgitation is mild.  EKG:         EKG 01/11/2023: Normal sinus rhythm at the rate of 78 bpm, leftward axis, nonspecific diffuse T wave abnormality.  Medications and allergies    Allergies  Allergen Reactions   Other Other (See Comments)    Chicken causes itching/  clorox, Bleach causes shortness of breath   Chicken Allergy    No Known Allergies     Previous listing of unable to tolerate SMELL of Clorox and  Chicken REACTION NOT AN ALLERGY      Current Outpatient Medications:    acetaminophen (TYLENOL) 500 MG tablet, Take 1,000 mg by mouth every 6 (six) hours as needed for mild pain., Disp: , Rfl:    albuterol (PROVENTIL HFA;VENTOLIN HFA) 108 (90 Base) MCG/ACT inhaler, Inhale 2 puffs into the lungs every 6 (six) hours as needed for wheezing or shortness of breath., Disp: 1 Inhaler, Rfl: 3   amLODipine (NORVASC) 5 MG tablet, Take 1 tablet (5 mg total) by mouth daily., Disp: 30 tablet, Rfl: 1   blood glucose meter kit and supplies, Dispense based on patient and insurance preference. Use up to four times daily as directed. (FOR ICD-10 E10.9, E11.9)., Disp: 1 each, Rfl: 0   diclofenac Sodium (VOLTAREN) 1 % GEL, Apply 2 g topically 4 (four) times daily. (Patient taking differently: Apply 2 g topically 4 (four) times daily as needed (pain).), Disp: 50 g, Rfl: 0   feeding supplement (ENSURE ENLIVE / ENSURE PLUS) LIQD, Take 237 mLs by mouth 2 (two) times daily between meals., Disp: 237 mL, Rfl: 12   gabapentin (NEURONTIN) 400 MG capsule, Take 400 mg by mouth 3 (three) times daily., Disp: , Rfl:    insulin aspart (NOVOLOG) 100 UNIT/ML FlexPen, Inject 1-9 Units into the skin 4 (four) times daily -  before meals and at bedtime. Glucose 121 - 150: 1 unit, Glucose 151 - 200: 2 units, Glucose 201 - 250: 3 units, Glucose 251 - 300: 5 units, Glucose 301 - 350: 7 units, Glucose 351 - 400: 9 units, Glucose > 400 call MD, Disp: 15 mL, Rfl: 11   insulin glargine (LANTUS) 100 UNIT/ML injection, Inject 0.1 mLs (10 Units total) into the skin daily., Disp: , Rfl:    Insulin Pen Needle 31G X 5 MM MISC, Use up to 4 times daily with insulin, Disp: 100 each, Rfl: 0   metFORMIN (GLUCOPHAGE) 500 MG tablet, Take 1,000 mg by mouth 2 (two) times daily with a meal., Disp: , Rfl:    mirtazapine (REMERON) 30 MG tablet, Take 30 mg by mouth at bedtime., Disp: , Rfl:    rosuvastatin (CRESTOR) 40 MG tablet, Take 40 mg by mouth daily., Disp:  , Rfl:    ASSESSMENT AND PLAN: .      ICD-10-CM   1. Syncope and collapse  R55     2. Primary hypertension  I10     3. Type 2 diabetes mellitus with stage 3b chronic kidney disease, with long-term  current use of insulin (HCC)  E11.22    N18.32    Z79.4     4. Mixed hyperlipidemia  E78.2       1. Syncope and collapse (Primary) ***  2. Primary hypertension ***  3. Type 2 diabetes mellitus with stage 3b chronic kidney disease, with long-term current use of insulin (HCC) ***  4. Mixed hyperlipidemia ***   Assessment and Plan                Signed,  Yates Decamp, MD, [facility] 02/27/2023, 4:07 PM [facility] [address] [place], [place] [address] Phone: [phone]. Fax:  [phone]

## 2023-03-02 ENCOUNTER — Encounter: Payer: Self-pay | Admitting: Cardiology

## 2023-03-25 ENCOUNTER — Telehealth: Payer: Self-pay

## 2023-03-25 NOTE — Progress Notes (Signed)
   03/25/2023  Patient ID: Nichole Munoz, female   DOB: 12-18-1945, 78 y.o.   MRN: [id number]  Contacted patient with interpreter regarding medication adherence from a quality report for [facility]. The patient failed [facility] and [facility] in 2024.    Left patient a voicemail to return my call at their convenience  Harlon Flor, PharmD Clinical Pharmacist  [phone]

## 2023-04-18 ENCOUNTER — Emergency Department (HOSPITAL_COMMUNITY)
Admission: EM | Admit: 2023-04-18 | Discharge: 2023-04-18 | Disposition: A | Discharge status: Home / Self Care | Attending: Emergency Medicine | Admitting: Emergency Medicine

## 2023-04-18 ENCOUNTER — Other Ambulatory Visit: Payer: Self-pay

## 2023-04-18 DIAGNOSIS — M545 Low back pain, unspecified: Secondary | ICD-10-CM | POA: Insufficient documentation

## 2023-04-18 DIAGNOSIS — R6889 Other general symptoms and signs: Secondary | ICD-10-CM | POA: Diagnosis not present

## 2023-04-18 DIAGNOSIS — M79672 Pain in left foot: Secondary | ICD-10-CM | POA: Insufficient documentation

## 2023-04-18 DIAGNOSIS — J45909 Unspecified asthma, uncomplicated: Secondary | ICD-10-CM | POA: Diagnosis not present

## 2023-04-18 DIAGNOSIS — Z8673 Personal history of transient ischemic attack (TIA), and cerebral infarction without residual deficits: Secondary | ICD-10-CM | POA: Insufficient documentation

## 2023-04-18 DIAGNOSIS — R7989 Other specified abnormal findings of blood chemistry: Secondary | ICD-10-CM | POA: Diagnosis not present

## 2023-04-18 DIAGNOSIS — I10 Essential (primary) hypertension: Secondary | ICD-10-CM | POA: Insufficient documentation

## 2023-04-18 DIAGNOSIS — G8929 Other chronic pain: Secondary | ICD-10-CM | POA: Insufficient documentation

## 2023-04-18 DIAGNOSIS — E119 Type 2 diabetes mellitus without complications: Secondary | ICD-10-CM | POA: Insufficient documentation

## 2023-04-18 DIAGNOSIS — M79604 Pain in right leg: Secondary | ICD-10-CM | POA: Diagnosis not present

## 2023-04-18 DIAGNOSIS — M791 Myalgia, unspecified site: Secondary | ICD-10-CM | POA: Diagnosis present

## 2023-04-18 DIAGNOSIS — M79605 Pain in left leg: Secondary | ICD-10-CM | POA: Diagnosis not present

## 2023-04-18 DIAGNOSIS — M79671 Pain in right foot: Secondary | ICD-10-CM | POA: Insufficient documentation

## 2023-04-18 DIAGNOSIS — R748 Abnormal levels of other serum enzymes: Secondary | ICD-10-CM | POA: Insufficient documentation

## 2023-04-18 DIAGNOSIS — R531 Weakness: Secondary | ICD-10-CM | POA: Diagnosis not present

## 2023-04-18 LAB — COMPREHENSIVE METABOLIC PANEL WITH GFR
ALT: 12 U/L (ref 0–44)
AST: 21 U/L (ref 15–41)
Albumin: 3.5 g/dL (ref 3.5–5.0)
Alkaline Phosphatase: 62 U/L (ref 38–126)
Anion gap: 10 (ref 5–15)
BUN: 30 mg/dL — ABNORMAL HIGH (ref 8–23)
CO2: 26 mmol/L (ref 22–32)
Calcium: 9 mg/dL (ref 8.9–10.3)
Chloride: 104 mmol/L (ref 98–111)
Creatinine, Ser: 1.24 mg/dL — ABNORMAL HIGH (ref 0.44–1.00)
GFR, Estimated: 45 mL/min — ABNORMAL LOW (ref >60)
Glucose, Bld: 152 mg/dL — ABNORMAL HIGH (ref 70–99)
Potassium: 3.9 mmol/L (ref 3.5–5.1)
Sodium: 140 mmol/L (ref 135–145)
Total Bilirubin: 0.6 mg/dL (ref 0.0–1.2)
Total Protein: 7.4 g/dL (ref 6.5–8.1)

## 2023-04-18 LAB — CBC WITH DIFFERENTIAL/PLATELET
Abs Immature Granulocytes: 0.02 10*3/uL (ref 0.00–0.07)
Basophils Absolute: 0 10*3/uL (ref 0.0–0.1)
Basophils Relative: 1 %
Eosinophils Absolute: 0.8 10*3/uL — ABNORMAL HIGH (ref 0.0–0.5)
Eosinophils Relative: 11 %
HCT: 33 % — ABNORMAL LOW (ref 36.0–46.0)
Hemoglobin: 11.1 g/dL — ABNORMAL LOW (ref 12.0–15.0)
Immature Granulocytes: 0 %
Lymphocytes Relative: 31 %
Lymphs Abs: 2.2 10*3/uL (ref 0.7–4.0)
MCH: 30.5 pg (ref 26.0–34.0)
MCHC: 33.6 g/dL (ref 30.0–36.0)
MCV: 90.7 fL (ref 80.0–100.0)
Monocytes Absolute: 0.5 10*3/uL (ref 0.1–1.0)
Monocytes Relative: 7 %
Neutro Abs: 3.6 10*3/uL (ref 1.7–7.7)
Neutrophils Relative %: 50 %
Platelets: 209 10*3/uL (ref 150–400)
RBC: 3.64 MIL/uL — ABNORMAL LOW (ref 3.87–5.11)
RDW: 11.8 % (ref 11.5–15.5)
WBC: 7.1 10*3/uL (ref 4.0–10.5)
nRBC: 0 % (ref 0.0–0.2)

## 2023-04-18 LAB — RESP PANEL BY RT-PCR (RSV, FLU A&B, COVID)  RVPGX2
Influenza A by PCR: NEGATIVE
Influenza B by PCR: NEGATIVE
Resp Syncytial Virus by PCR: NEGATIVE
SARS Coronavirus 2 by RT PCR: NEGATIVE

## 2023-04-18 LAB — URINALYSIS, W/ REFLEX TO CULTURE (INFECTION SUSPECTED)
Bilirubin Urine: NEGATIVE
Glucose, UA: 50 mg/dL — AB
Hgb urine dipstick: NEGATIVE
Ketones, ur: NEGATIVE mg/dL
Leukocytes,Ua: NEGATIVE
Nitrite: NEGATIVE
Protein, ur: 100 mg/dL — AB
Specific Gravity, Urine: 1.011 (ref 1.005–1.030)
pH: 7 (ref 5.0–8.0)

## 2023-04-18 LAB — TROPONIN I (HIGH SENSITIVITY)
Troponin I (High Sensitivity): 21 ng/L — ABNORMAL HIGH (ref <18)
Troponin I (High Sensitivity): 18 ng/L — ABNORMAL HIGH (ref <18)

## 2023-04-18 LAB — LIPASE, BLOOD: Lipase: 50 U/L (ref 11–51)

## 2023-04-18 LAB — CK: Total CK: 339 U/L — ABNORMAL HIGH (ref 38–234)

## 2023-04-18 MED ORDER — SODIUM CHLORIDE 0.9 % IV BOLUS
500.0000 mL | Freq: Once | INTRAVENOUS | Status: AC
  Administered 2023-04-18: 500 mL via INTRAVENOUS

## 2023-04-18 NOTE — ED Triage Notes (Addendum)
 Pt bib PTAR believed to be from "daughters home". EMS called out due to patient having body aches. No previous medical hx or meds given.    RR20 SP02 97 97HR  98.9 Temp 170 palpated     Needs interperter [place]

## 2023-04-18 NOTE — Discharge Instructions (Signed)
 You were seen in the emergency department for your back pain, leg pain and bodyaches.  Your workup showed no signs of infection or muscle breakdown.  It is unclear what is causing your pain but could be nerve related.  You should continue to take your gabapentin as prescribed and can talk to your primary doctor about if you need to have any dose changes to help with your pain.  You can also take Tylenol every 6 hours and use lidocaine patches.  You should follow-up with your primary doctor in the next few days to have your symptoms rechecked and may benefit from physical therapy due to the severity of your pain and I have also given you spine surgery to follow-up with as needed.  You should return to the emergency department if you have significantly worsening pain and are unable to walk, unable to urinate, you have fevers or any other new or concerning symptoms.

## 2023-04-18 NOTE — ED Notes (Signed)
 Pt left with daughter with discharge paper work, in no new onset distress at this time. Pt taken to front by tech.
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
## 2023-04-18 NOTE — ED Provider Notes (Signed)
 Patient signed out to me at 0700 by Dr. Dolan Freiberg pending repeat troponin and UA.  In short this is a 78 year old female with a past medical history of hypertension, diabetes, asthma, diabetic neuropathy that presented to the emergency department with generalized bodyaches.  History was somewhat limited as the patient knows only some [place] and speaks [place]mer for which we are unable to get an interpreter overnight.  Patient's initial troponin was mildly elevated at 21 and is 18 on repeat.  Labs are otherwise at baseline without acute abnormality, COVID, flu and RSV is negative.  Urine is pending to evaluate for infection as possible cause of her body aches.  Also possible pain related to diabetic neuropathy.  8:13 AM UA with rare bacteria, no other signs of infection and will not treat for UTI. Khmer interpreter [phone] was able to be obtained. Patient reports to me that she is mostly having back pain that goes down both her legs. She states she doesn't sleep a lot at night and was having a lot of pain which prompted her to call 911.  She states that she did not fall or have any injuries.  She states that she is able to walk and is active throughout the day but sometimes will crawl around the house when the pain is too severe.  She states that she has numbness in her feet from her diabetic neuropathy at baseline.  The patient has no midline neck or back tenderness on my exam, no CVA tenderness, no overlying skin changes, no point bony tenderness in the bilateral lower extremities, does have pain with straight leg raise bilaterally but due to interpretation unclear if this is true positive for sciatica versus to a stretching type of pain.  The patient is recommended to continue her home gabapentin as well as adding Tylenol and lidocaine patches.  Recommended follow-up with primary doctor for likely PT and will also be given spine surgery follow-up.  She is stable for discharge home with outpatient follow up.    Kingsley, Babara [place] K, DO 04/18/23 [phone]

## 2023-04-18 NOTE — ED Notes (Signed)
 Unable to get interpreter for khmer through video or phone. Language services advised to call back in 30 minutes.

## 2023-04-18 NOTE — ED Notes (Signed)
 CCMD notified. Pt is on monitor.

## 2023-04-18 NOTE — ED Notes (Signed)
 Unccessful attempt X3. Called patients POC on file both son and daughter.

## 2023-05-04 ENCOUNTER — Emergency Department (HOSPITAL_COMMUNITY)

## 2023-05-04 ENCOUNTER — Observation Stay (HOSPITAL_COMMUNITY)
Admission: EM | Admit: 2023-05-04 | Discharge: 2023-05-08 | Disposition: A | Discharge status: Home / Self Care | Attending: Emergency Medicine | Admitting: Emergency Medicine

## 2023-05-04 ENCOUNTER — Encounter (HOSPITAL_COMMUNITY): Payer: Self-pay

## 2023-05-04 DIAGNOSIS — N1832 Chronic kidney disease, stage 3b: Secondary | ICD-10-CM | POA: Insufficient documentation

## 2023-05-04 DIAGNOSIS — E1122 Type 2 diabetes mellitus with diabetic chronic kidney disease: Secondary | ICD-10-CM | POA: Diagnosis not present

## 2023-05-04 DIAGNOSIS — E162 Hypoglycemia, unspecified: Secondary | ICD-10-CM | POA: Diagnosis present

## 2023-05-04 DIAGNOSIS — R680 Hypothermia, not associated with low environmental temperature: Secondary | ICD-10-CM | POA: Insufficient documentation

## 2023-05-04 DIAGNOSIS — E782 Mixed hyperlipidemia: Secondary | ICD-10-CM | POA: Diagnosis not present

## 2023-05-04 DIAGNOSIS — E11649 Type 2 diabetes mellitus with hypoglycemia without coma: Secondary | ICD-10-CM | POA: Diagnosis not present

## 2023-05-04 DIAGNOSIS — J45909 Unspecified asthma, uncomplicated: Secondary | ICD-10-CM | POA: Diagnosis not present

## 2023-05-04 DIAGNOSIS — I129 Hypertensive chronic kidney disease with stage 1 through stage 4 chronic kidney disease, or unspecified chronic kidney disease: Secondary | ICD-10-CM | POA: Diagnosis not present

## 2023-05-04 DIAGNOSIS — S0990XA Unspecified injury of head, initial encounter: Secondary | ICD-10-CM | POA: Diagnosis not present

## 2023-05-04 DIAGNOSIS — R5383 Other fatigue: Secondary | ICD-10-CM | POA: Diagnosis not present

## 2023-05-04 DIAGNOSIS — D649 Anemia, unspecified: Secondary | ICD-10-CM | POA: Insufficient documentation

## 2023-05-04 DIAGNOSIS — G9341 Metabolic encephalopathy: Secondary | ICD-10-CM | POA: Diagnosis not present

## 2023-05-04 DIAGNOSIS — Z79899 Other long term (current) drug therapy: Secondary | ICD-10-CM | POA: Insufficient documentation

## 2023-05-04 DIAGNOSIS — Z794 Long term (current) use of insulin: Secondary | ICD-10-CM | POA: Insufficient documentation

## 2023-05-04 DIAGNOSIS — I499 Cardiac arrhythmia, unspecified: Secondary | ICD-10-CM | POA: Diagnosis not present

## 2023-05-04 DIAGNOSIS — Z743 Need for continuous supervision: Secondary | ICD-10-CM | POA: Diagnosis not present

## 2023-05-04 DIAGNOSIS — G8929 Other chronic pain: Secondary | ICD-10-CM | POA: Insufficient documentation

## 2023-05-04 DIAGNOSIS — Z8673 Personal history of transient ischemic attack (TIA), and cerebral infarction without residual deficits: Secondary | ICD-10-CM | POA: Diagnosis not present

## 2023-05-04 DIAGNOSIS — R4182 Altered mental status, unspecified: Principal | ICD-10-CM

## 2023-05-04 DIAGNOSIS — I10 Essential (primary) hypertension: Secondary | ICD-10-CM | POA: Diagnosis present

## 2023-05-04 DIAGNOSIS — Z1152 Encounter for screening for COVID-19: Secondary | ICD-10-CM | POA: Insufficient documentation

## 2023-05-04 DIAGNOSIS — E161 Other hypoglycemia: Secondary | ICD-10-CM | POA: Diagnosis not present

## 2023-05-04 DIAGNOSIS — E119 Type 2 diabetes mellitus without complications: Secondary | ICD-10-CM

## 2023-05-04 DIAGNOSIS — E1169 Type 2 diabetes mellitus with other specified complication: Secondary | ICD-10-CM | POA: Insufficient documentation

## 2023-05-04 DIAGNOSIS — R410 Disorientation, unspecified: Secondary | ICD-10-CM | POA: Diagnosis not present

## 2023-05-04 DIAGNOSIS — Z7984 Long term (current) use of oral hypoglycemic drugs: Secondary | ICD-10-CM | POA: Insufficient documentation

## 2023-05-04 DIAGNOSIS — T68XXXA Hypothermia, initial encounter: Secondary | ICD-10-CM

## 2023-05-04 DIAGNOSIS — R404 Transient alteration of awareness: Secondary | ICD-10-CM | POA: Diagnosis not present

## 2023-05-04 DIAGNOSIS — I700 Atherosclerosis of aorta: Secondary | ICD-10-CM | POA: Diagnosis not present

## 2023-05-04 DIAGNOSIS — R6889 Other general symptoms and signs: Secondary | ICD-10-CM | POA: Diagnosis not present

## 2023-05-04 LAB — URINALYSIS, W/ REFLEX TO CULTURE (INFECTION SUSPECTED)
Bilirubin Urine: NEGATIVE
Glucose, UA: 50 mg/dL — AB
Ketones, ur: NEGATIVE mg/dL
Leukocytes,Ua: NEGATIVE
Nitrite: NEGATIVE
Protein, ur: >=300 mg/dL — AB
Specific Gravity, Urine: 1.011 (ref 1.005–1.030)
pH: 7 (ref 5.0–8.0)

## 2023-05-04 LAB — CBC WITH DIFFERENTIAL/PLATELET
Abs Immature Granulocytes: 0.03 10*3/uL (ref 0.00–0.07)
Basophils Absolute: 0 10*3/uL (ref 0.0–0.1)
Basophils Relative: 0 %
Eosinophils Absolute: 0.1 10*3/uL (ref 0.0–0.5)
Eosinophils Relative: 1 %
HCT: 33.1 % — ABNORMAL LOW (ref 36.0–46.0)
Hemoglobin: 11 g/dL — ABNORMAL LOW (ref 12.0–15.0)
Immature Granulocytes: 0 %
Lymphocytes Relative: 8 %
Lymphs Abs: 0.8 10*3/uL (ref 0.7–4.0)
MCH: 29.9 pg (ref 26.0–34.0)
MCHC: 33.2 g/dL (ref 30.0–36.0)
MCV: 89.9 fL (ref 80.0–100.0)
Monocytes Absolute: 0.3 10*3/uL (ref 0.1–1.0)
Monocytes Relative: 3 %
Neutro Abs: 8.4 10*3/uL — ABNORMAL HIGH (ref 1.7–7.7)
Neutrophils Relative %: 88 %
Platelets: 217 10*3/uL (ref 150–400)
RBC: 3.68 MIL/uL — ABNORMAL LOW (ref 3.87–5.11)
RDW: 11.6 % (ref 11.5–15.5)
WBC: 9.7 10*3/uL (ref 4.0–10.5)
nRBC: 0 % (ref 0.0–0.2)

## 2023-05-04 LAB — CBG MONITORING, ED
Glucose-Capillary: 56 mg/dL — ABNORMAL LOW (ref 70–99)
Glucose-Capillary: 95 mg/dL (ref 70–99)

## 2023-05-04 LAB — COMPREHENSIVE METABOLIC PANEL WITH GFR
ALT: 15 U/L (ref 0–44)
AST: 32 U/L (ref 15–41)
Albumin: 3.7 g/dL (ref 3.5–5.0)
Alkaline Phosphatase: 77 U/L (ref 38–126)
Anion gap: 10 (ref 5–15)
BUN: 36 mg/dL — ABNORMAL HIGH (ref 8–23)
CO2: 26 mmol/L (ref 22–32)
Calcium: 8.6 mg/dL — ABNORMAL LOW (ref 8.9–10.3)
Chloride: 103 mmol/L (ref 98–111)
Creatinine, Ser: 1.08 mg/dL — ABNORMAL HIGH (ref 0.44–1.00)
GFR, Estimated: 53 mL/min — ABNORMAL LOW (ref >60)
Glucose, Bld: 74 mg/dL (ref 70–99)
Potassium: 3.7 mmol/L (ref 3.5–5.1)
Sodium: 139 mmol/L (ref 135–145)
Total Bilirubin: 0.5 mg/dL (ref 0.0–1.2)
Total Protein: 8.2 g/dL — ABNORMAL HIGH (ref 6.5–8.1)

## 2023-05-04 LAB — MAGNESIUM: Magnesium: 1.4 mg/dL — ABNORMAL LOW (ref 1.7–2.4)

## 2023-05-04 LAB — TSH: TSH: 2.067 u[IU]/mL (ref 0.350–4.500)

## 2023-05-04 MED ORDER — DEXTROSE IN LACTATED RINGERS 5 % IV SOLN: INTRAVENOUS | Status: AC

## 2023-05-04 MED ORDER — DEXTROSE 50 % IV SOLN
1.0000 | Freq: Once | INTRAVENOUS | Status: AC
  Administered 2023-05-04: 50 mL via INTRAVENOUS
  Filled 2023-05-04: qty 50

## 2023-05-04 MED ORDER — MAGNESIUM SULFATE 2 GM/50ML IV SOLN
2.0000 g | Freq: Once | INTRAVENOUS | Status: AC
  Administered 2023-05-04: 2 g via INTRAVENOUS
  Filled 2023-05-04: qty 50

## 2023-05-04 NOTE — ED Provider Notes (Signed)
 [place] EMERGENCY DEPARTMENT AT [facility] Provider Note   CSN: [id number] Arrival date & time: 05/04/23  2108     History  Chief Complaint  Patient presents with  . Altered Mental Status    Nichole Munoz is a 78 y.o. Khmer speaking female.  With a history of diabetes, diabetic neuropathy, CKD, hypertension and CVA with residual deficit who presents to the ED for altered mental status.  Patient lives with family members and sleeps on a mattress on the floor.  She was noted to be on the floor next in the mattress and confused by her family so EMS was called.  Upon initial EMS evaluation she was noted to be hypoglycemic with initial blood glucose of 32.  Dextrose  was administered and repeat CBG was 191.  History obtained through use of Khmer interpreter.  Patient reports pain all over which she has had for months as well as numbness in both feet which she has also had for months.  Aside from that she has no systemic complaints.  She is awake alert oriented 1.  Denies headache chest pain shortness of breath abdominal pain fevers and recent illness   Altered Mental Status      Home Medications Prior to Admission medications   Medication Sig Start Date End Date Taking? Authorizing Provider  acetaminophen  (TYLENOL ) 500 MG tablet Take 1,000 mg by mouth every 6 (six) hours as needed for mild pain.    [provider]  albuterol  (PROVENTIL  HFA;VENTOLIN  HFA) 108 (90 Base) MCG/ACT inhaler Inhale 2 puffs into the lungs every 6 (six) hours as needed for wheezing or shortness of breath. 02/21/18   Samtani, Jai-Gurmukh, MD  amLODipine  (NORVASC ) 5 MG tablet Take 1 tablet (5 mg total) by mouth daily. 01/14/23   Ghimire, Estil Heman, MD  blood glucose meter kit and supplies Dispense based on patient and insurance preference. Use up to four times daily as directed. (FOR ICD-10 E10.9, E11.9). 01/26/17   Daren Eck, DO  diclofenac  Sodium (VOLTAREN ) 1 % GEL Apply 2 g topically 4 (four) times  daily. Patient taking differently: Apply 2 g topically 4 (four) times daily as needed (pain). 02/19/19   Khatri, Hina, PA-C  feeding supplement (ENSURE ENLIVE / ENSURE PLUS) LIQD Take 237 mLs by mouth 2 (two) times daily between meals. 07/28/21   Hoyt Macleod, MD  gabapentin  (NEURONTIN ) 400 MG capsule Take 400 mg by mouth 3 (three) times daily. 06/19/21   [provider]  insulin  aspart (NOVOLOG ) 100 UNIT/ML FlexPen Inject 1-9 Units into the skin 4 (four) times daily -  before meals and at bedtime. Glucose 121 - 150: 1 unit, Glucose 151 - 200: 2 units, Glucose 201 - 250: 3 units, Glucose 251 - 300: 5 units, Glucose 301 - 350: 7 units, Glucose 351 - 400: 9 units, Glucose > 400 call MD 06/04/21   Faith Homes, MD  insulin  glargine (LANTUS ) 100 UNIT/ML injection Inject 0.1 mLs (10 Units total) into the skin daily. 01/14/23 04/14/23  Burton Casey, MD  Insulin  Pen Needle 31G X 5 MM MISC Use up to 4 times daily with insulin  01/26/17   Daren Eck, DO  metFORMIN  (GLUCOPHAGE ) 500 MG tablet Take 1,000 mg by mouth 2 (two) times daily with a meal. 07/30/22   [provider]  mirtazapine  (REMERON ) 30 MG tablet Take 30 mg by mouth at bedtime. 07/29/22   [provider]  rosuvastatin  (CRESTOR ) 40 MG tablet Take 40 mg by mouth daily. 04/29/21  [provider]      Allergies    Other, Chicken allergy, and No known allergies    Review of Systems   Review of Systems  Physical Exam Updated Vital Signs BP (!) 139/119   Pulse 83   Temp (!) 93.6 F (34.2 C) (Rectal)   Resp 19   Ht 5' (1.524 m)   Wt 45.4 kg   SpO2 98%   BMI 19.53 kg/m  Physical Exam Vitals and nursing note reviewed.  HENT:     Head: Normocephalic and atraumatic.  Eyes:     Pupils: Pupils are equal, round, and reactive to light.  Cardiovascular:     Rate and Rhythm: Normal rate and regular rhythm.  Pulmonary:     Effort: Pulmonary effort is normal.     Breath sounds: Normal breath sounds.   Abdominal:     Palpations: Abdomen is soft.     Tenderness: There is no abdominal tenderness.  Skin:    General: Skin is warm and dry.  Neurological:     General: No focal deficit present.     Mental Status: She is alert and oriented to person, place, and time.     Motor: No weakness.     Comments: Diminished sensation symmetrically bilateral lower extremities Awake alert oriented Able to follow commands Symmetric motor strength with no drift Clear fluent speech  Psychiatric:        Mood and Affect: Mood normal.     ED Results / Procedures / Treatments   Labs (all labs ordered are listed, but only abnormal results are displayed) Labs Reviewed  COMPREHENSIVE METABOLIC PANEL WITH GFR - Abnormal; Notable for the following components:      Result Value   BUN 36 (*)    Creatinine, Ser 1.08 (*)    Calcium  8.6 (*)    Total Protein 8.2 (*)    GFR, Estimated 53 (*)    All other components within normal limits  CBC WITH DIFFERENTIAL/PLATELET - Abnormal; Notable for the following components:   RBC 3.68 (*)    Hemoglobin 11.0 (*)    HCT 33.1 (*)    Neutro Abs 8.4 (*)    All other components within normal limits  MAGNESIUM  - Abnormal; Notable for the following components:   Magnesium  1.4 (*)    All other components within normal limits  URINALYSIS, W/ REFLEX TO CULTURE (INFECTION SUSPECTED) - Abnormal; Notable for the following components:   Glucose, UA 50 (*)    Hgb urine dipstick SMALL (*)    Protein, ur >=300 (*)    Bacteria, UA RARE (*)    All other components within normal limits  CBG MONITORING, ED - Abnormal; Notable for the following components:   Glucose-Capillary 56 (*)    All other components within normal limits  CULTURE, BLOOD (ROUTINE X 2)  CULTURE, BLOOD (ROUTINE X 2)  RESP PANEL BY RT-PCR (RSV, FLU A&B, COVID)  [id number]  TSH  T4, FREE  CBG MONITORING, ED  I-STAT CG4 LACTIC ACID, ED  I-STAT CG4 LACTIC ACID, ED    EKG EKG Interpretation Date/Time:  Monday  May 04 2023 21:52:33 EDT Ventricular Rate:  91 PR Interval:  55 QRS Duration:  107 QT Interval:  413 QTC Calculation: 506 R Axis:   35  Text Interpretation: Sinus rhythm Atrial premature complex Short PR interval Probable anteroseptal infarct, recent Prolonged QT interval Confirmed by Rafael Bun [phone]) on 05/04/2023 11:22:38 PM  Radiology DG Chest Portable 1 View Result Date:  05/04/2023 CLINICAL DATA:  Possible pneumonia.  Confusion and lethargy. EXAM: PORTABLE CHEST 1 VIEW COMPARISON:  01/11/2023 FINDINGS: Shallow inspiration. Heart size and pulmonary vascularity are normal. Calcification of the aorta. Mediastinal contours appear intact. Lungs are clear. No pleural effusion or pneumothorax. IMPRESSION: No active disease. Electronically Signed   By: Boyce Byes M.D.   On: 05/04/2023 23:26   CT Head Wo Contrast Result Date: 05/04/2023 CLINICAL DATA:  Head trauma, minor (Age >= 65y) EXAM: CT HEAD WITHOUT CONTRAST TECHNIQUE: Contiguous axial images were obtained from the base of the skull through the vertex without intravenous contrast. RADIATION DOSE REDUCTION: This exam was performed according to the departmental dose-optimization program which includes automated exposure control, adjustment of the mA and/or kV according to patient size and/or use of iterative reconstruction technique. COMPARISON:  None Available. FINDINGS: Brain: Cavum septum pellucidum. Parenchymal volume loss is commensurate with the patient's age. Mild periventricular white matter changes are present likely reflecting the sequela of small vessel ischemia. Remote lacunar infarct noted within the right anterior capsule no abnormal intra or extra-axial mass lesion or fluid collection. No abnormal mass effect or midline shift. No evidence of acute intracranial hemorrhage or infarct. Ventricular size is normal. Cerebellum unremarkable. Vascular: No asymmetric hyperdense vasculature at the skull base. Skull: Intact  Sinuses/Orbits: Paranasal sinuses are clear. Orbits are unremarkable. Other: Mastoid air cells and middle ear cavities are clear. IMPRESSION: 1. No acute intracranial abnormality. 2. Mild periventricular white matter changes likely reflecting the sequela of small vessel ischemia. 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 Electronically Signed   By: Worthy Heads M.D.   On: 05/04/2023 22:35    Procedures Procedures    Medications Ordered in ED Medications  magnesium  sulfate IVPB 2 g 50 mL (2 g Intravenous New Bag/Given 05/04/23 2353)  dextrose  5 % in lactated ringers  infusion ( Intravenous New Bag/Given 05/04/23 2345)  dextrose  50 % solution 50 mL (50 mLs Intravenous Given 05/04/23 2337)    ED Course/ Medical Decision Making/ A&P Clinical Course as of 05/05/23 0059  Tue May 05, 2023  0001 No UTI.  CMP unremarkable.  CBC shows no leukocytosis or significant anemia.  Hypomagnesemia of 1.4.  Will replete magnesium  here.  TSH within normal limits.  Chest x-ray shows no focal consolidation.  CT head unremarkable  Patient did have recurrent episode of hypoglycemia for which we gave an amp of D50.  She is able to eat and we will give her a sandwich and juice here as well.  Given recurrent hypoglycemia will admit to medicine for observation overnight [MP]  0045 Discussed admitting hospitalist who accepts patient for admission [MP]    Clinical Course User Index [MP] Sallyanne Creamer, DO                                 Medical Decision Making 78 year old female Khmer speaking with history as above presenting given concern for altered mental status potential fall.  Initial vital signs notable for hypothermia and hypoglycemia in the 30s.  Improvement after dextrose  was administered.  No systemic complaints or evidence of trauma on my exam.  No focal neurologic deficit.  Differential diagnosis includes hypoglycemia in the setting of type 2 diabetes on insulin  with poor p.o. intake,  malnutrition, acute infectious process such as pneumonia or UTI.  Myxedema coma, head trauma and dysrhythmia.  Will obtain laboratory workup CT head chest x-ray UA continue to monitor  Will start on warming blanket and monitor blood glucose closely.  Will provide dextrose  and encourage p.o. intake in the event of recurrent hypoglycemia  Amount and/or Complexity of Data Reviewed Labs: ordered. Radiology: ordered.  Risk Prescription drug management.           Final Clinical Impression(s) / ED Diagnoses Final diagnoses:  Altered mental status, unspecified altered mental status type  Hypomagnesemia  Hypoglycemia    Rx / DC Orders ED Discharge Orders     None         Sallyanne Creamer, DO 05/05/23 0003

## 2023-05-04 NOTE — ED Triage Notes (Signed)
 Pt rolled out of bed confused lethargic EMS called cbg 32, D10 given now cbg is 191. Faamily poor historian. Speak cambodgian.  148 palpated, HR 100 irregular RR 18 O2 93% RA.

## 2023-05-05 ENCOUNTER — Other Ambulatory Visit: Payer: Self-pay

## 2023-05-05 DIAGNOSIS — G9341 Metabolic encephalopathy: Secondary | ICD-10-CM | POA: Diagnosis not present

## 2023-05-05 DIAGNOSIS — E162 Hypoglycemia, unspecified: Secondary | ICD-10-CM

## 2023-05-05 DIAGNOSIS — T68XXXA Hypothermia, initial encounter: Secondary | ICD-10-CM

## 2023-05-05 DIAGNOSIS — E11649 Type 2 diabetes mellitus with hypoglycemia without coma: Secondary | ICD-10-CM | POA: Diagnosis present

## 2023-05-05 LAB — CBC WITH DIFFERENTIAL/PLATELET
Abs Immature Granulocytes: 0.03 10*3/uL (ref 0.00–0.07)
Basophils Absolute: 0 10*3/uL (ref 0.0–0.1)
Basophils Relative: 0 %
Eosinophils Absolute: 0.4 10*3/uL (ref 0.0–0.5)
Eosinophils Relative: 5 %
HCT: 32.7 % — ABNORMAL LOW (ref 36.0–46.0)
Hemoglobin: 10.6 g/dL — ABNORMAL LOW (ref 12.0–15.0)
Immature Granulocytes: 0 %
Lymphocytes Relative: 33 %
Lymphs Abs: 2.3 10*3/uL (ref 0.7–4.0)
MCH: 30.1 pg (ref 26.0–34.0)
MCHC: 32.4 g/dL (ref 30.0–36.0)
MCV: 92.9 fL (ref 80.0–100.0)
Monocytes Absolute: 0.4 10*3/uL (ref 0.1–1.0)
Monocytes Relative: 5 %
Neutro Abs: 4 10*3/uL (ref 1.7–7.7)
Neutrophils Relative %: 57 %
Platelets: 189 10*3/uL (ref 150–400)
RBC: 3.52 MIL/uL — ABNORMAL LOW (ref 3.87–5.11)
RDW: 11.7 % (ref 11.5–15.5)
WBC: 7.2 10*3/uL (ref 4.0–10.5)
nRBC: 0 % (ref 0.0–0.2)

## 2023-05-05 LAB — CBG MONITORING, ED
Glucose-Capillary: 131 mg/dL — ABNORMAL HIGH (ref 70–99)
Glucose-Capillary: 152 mg/dL — ABNORMAL HIGH (ref 70–99)
Glucose-Capillary: 189 mg/dL — ABNORMAL HIGH (ref 70–99)
Glucose-Capillary: 224 mg/dL — ABNORMAL HIGH (ref 70–99)
Glucose-Capillary: 231 mg/dL — ABNORMAL HIGH (ref 70–99)
Glucose-Capillary: 258 mg/dL — ABNORMAL HIGH (ref 70–99)

## 2023-05-05 LAB — HEMOGLOBIN A1C
Hgb A1c MFr Bld: 7 % — ABNORMAL HIGH (ref 4.8–5.6)
Mean Plasma Glucose: 154.2 mg/dL

## 2023-05-05 LAB — COMPREHENSIVE METABOLIC PANEL WITH GFR
ALT: 12 U/L (ref 0–44)
AST: 23 U/L (ref 15–41)
Albumin: 3.1 g/dL — ABNORMAL LOW (ref 3.5–5.0)
Alkaline Phosphatase: 67 U/L (ref 38–126)
Anion gap: 12 (ref 5–15)
BUN: 35 mg/dL — ABNORMAL HIGH (ref 8–23)
CO2: 23 mmol/L (ref 22–32)
Calcium: 8 mg/dL — ABNORMAL LOW (ref 8.9–10.3)
Chloride: 104 mmol/L (ref 98–111)
Creatinine, Ser: 1.59 mg/dL — ABNORMAL HIGH (ref 0.44–1.00)
GFR, Estimated: 33 mL/min — ABNORMAL LOW
Glucose, Bld: 242 mg/dL — ABNORMAL HIGH (ref 70–99)
Potassium: 4.1 mmol/L (ref 3.5–5.1)
Sodium: 139 mmol/L (ref 135–145)
Total Bilirubin: 0.5 mg/dL (ref 0.0–1.2)
Total Protein: 6.8 g/dL (ref 6.5–8.1)

## 2023-05-05 LAB — CORTISOL: Cortisol, Plasma: 14.3 ug/dL

## 2023-05-05 LAB — I-STAT CG4 LACTIC ACID, ED
Lactic Acid, Venous: 1 mmol/L (ref 0.5–1.9)
Lactic Acid, Venous: 1.8 mmol/L (ref 0.5–1.9)

## 2023-05-05 LAB — RESP PANEL BY RT-PCR (RSV, FLU A&B, COVID)  RVPGX2
Influenza A by PCR: NEGATIVE
Influenza B by PCR: NEGATIVE
Resp Syncytial Virus by PCR: NEGATIVE
SARS Coronavirus 2 by RT PCR: NEGATIVE

## 2023-05-05 LAB — MAGNESIUM: Magnesium: 1.9 mg/dL (ref 1.7–2.4)

## 2023-05-05 LAB — GLUCOSE, CAPILLARY
Glucose-Capillary: 108 mg/dL — ABNORMAL HIGH (ref 70–99)
Glucose-Capillary: 195 mg/dL — ABNORMAL HIGH (ref 70–99)

## 2023-05-05 LAB — T4, FREE: Free T4: 0.89 ng/dL (ref 0.61–1.12)

## 2023-05-05 LAB — PROCALCITONIN: Procalcitonin: <0.1 ng/mL

## 2023-05-05 MED ORDER — ALBUTEROL SULFATE HFA 108 (90 BASE) MCG/ACT IN AERS: 2.0000 | INHALATION_SPRAY | Freq: Four times a day (QID) | RESPIRATORY_TRACT | Status: DC | PRN

## 2023-05-05 MED ORDER — AMLODIPINE BESYLATE 10 MG PO TABS
5.0000 mg | ORAL_TABLET | Freq: Every day | ORAL | Status: DC
  Administered 2023-05-05 – 2023-05-08 (×4): 5 mg via ORAL
  Filled 2023-05-05 (×4): qty 1

## 2023-05-05 MED ORDER — TRAMADOL HCL 50 MG PO TABS
25.0000 mg | ORAL_TABLET | Freq: Once | ORAL | Status: AC
  Administered 2023-05-05: 25 mg via ORAL
  Filled 2023-05-05: qty 1

## 2023-05-05 MED ORDER — ACETAMINOPHEN 325 MG PO TABS
650.0000 mg | ORAL_TABLET | Freq: Four times a day (QID) | ORAL | Status: DC | PRN
  Administered 2023-05-05 – 2023-05-06 (×5): 650 mg via ORAL
  Filled 2023-05-05 (×4): qty 2

## 2023-05-05 MED ORDER — ROSUVASTATIN CALCIUM 20 MG PO TABS
40.0000 mg | ORAL_TABLET | Freq: Every day | ORAL | Status: DC
  Administered 2023-05-05 – 2023-05-08 (×4): 40 mg via ORAL
  Filled 2023-05-05 (×4): qty 2

## 2023-05-05 MED ORDER — ALBUTEROL SULFATE (2.5 MG/3ML) 0.083% IN NEBU: 2.5000 mg | INHALATION_SOLUTION | Freq: Four times a day (QID) | RESPIRATORY_TRACT | Status: DC | PRN

## 2023-05-05 MED ORDER — MIRTAZAPINE 15 MG PO TABS
30.0000 mg | ORAL_TABLET | Freq: Every day | ORAL | Status: DC
  Administered 2023-05-05 – 2023-05-07 (×3): 30 mg via ORAL
  Filled 2023-05-05 (×3): qty 2

## 2023-05-05 MED ORDER — ENOXAPARIN SODIUM 30 MG/0.3ML IJ SOSY
30.0000 mg | PREFILLED_SYRINGE | Freq: Every day | INTRAMUSCULAR | Status: DC
  Administered 2023-05-05 – 2023-05-08 (×4): 30 mg via SUBCUTANEOUS
  Filled 2023-05-05 (×4): qty 0.3

## 2023-05-05 MED ORDER — ACETAMINOPHEN 650 MG RE SUPP: 650.0000 mg | Freq: Four times a day (QID) | RECTAL | Status: DC | PRN

## 2023-05-05 MED ORDER — INSULIN ASPART 100 UNIT/ML IJ SOLN
0.0000 [IU] | Freq: Three times a day (TID) | INTRAMUSCULAR | Status: DC
  Administered 2023-05-05: 5 [IU] via SUBCUTANEOUS
  Administered 2023-05-06 – 2023-05-07 (×5): 2 [IU] via SUBCUTANEOUS
  Administered 2023-05-07: 1 [IU] via SUBCUTANEOUS
  Administered 2023-05-08: 2 [IU] via SUBCUTANEOUS
  Filled 2023-05-05: qty 0.09

## 2023-05-05 MED ORDER — GABAPENTIN 400 MG PO CAPS
400.0000 mg | ORAL_CAPSULE | Freq: Three times a day (TID) | ORAL | Status: DC
  Administered 2023-05-05 – 2023-05-08 (×10): 400 mg via ORAL
  Filled 2023-05-05 (×10): qty 1

## 2023-05-05 MED ORDER — ENSURE ENLIVE PO LIQD
237.0000 mL | Freq: Two times a day (BID) | ORAL | Status: DC
  Filled 2023-05-05: qty 237
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
## 2023-05-05 NOTE — Progress Notes (Signed)
 This is a disorder note for this patient who is a 78 year old female which is current admitted for hypoglycemia, hypothermia and acute metabolic encephalopathy. Patient was seen and examined at bedside today this morning.  Patient's hypoglycemia/hypothermia resolved mental status is also improved.   Assessment and Plan   Hypoglycemia Type 2 diabetes According to the patient, she is not taking insulin  or any other medications at home.  She reports chronically poor appetite -eats snacks/few teaspoons of rice but not full meals.  Supposed to be on appetite stimulant mirtazapine  but not taking any meds.  Glucose initially in the 30s with EMS.  Started on D5-LR infusion in the ED due to recurrent hypoglycemia.  Continue to monitor CBG every 2 hours for now and encourage p.o. intake.  Dietitian consulted and continue mirtazapine .  Check A1c.  - Dextrose  containing fluids stopped - Patient is able to take p.o. intake - Sugars increased to 250, sliding scale insulin  has been initiated.   Acute metabolic encephalopathy Due to hypoglycemia.  Mental status has now improved.  Patient is awake and alert, answering questions.  No obvious focal neurologic deficit on exam.  CT head negative for acute intracranial abnormality. -Likely secondary to hypoglycemia, hypothermia, mental status improved continue to monitor with neurochecks.   Hypothermia Now resolved.  No infectious signs or symptoms.  TSH and free T4 normal. ?Adrenal insufficiency given hypoglycemia but no hypotension, check cortisol level.  Follow-up blood cultures and check procalcitonin level.  -Resolved continue to encourage p.o. intake   Hypomagnesemia Replace magnesium  and continue to monitor labs.-Check magnesium  level in the morning.History of CVA Hyperlipidemia Continue Crestor .   CKD stage IIIa Creatinine stable.   Chronic normocytic anemia Hemoglobin stable.   Hypertension SBP currently in the 140s.  Continue amlodipine .    Asthma Stable, no signs of acute exacerbation.  Continue albuterol  PRN.   Chronic pain/neuropathy Continue gabapentin .   DVT prophylaxis: Lovenox 

## 2023-05-05 NOTE — Plan of Care (Signed)
   Problem: Education: Goal: Ability to describe self-care measures that may prevent or decrease complications (Diabetes Survival Skills Education) will improve Outcome: Progressing Goal: Individualized Educational Video(s) Outcome: Progressing   Problem: Health Behavior/Discharge Planning: Goal: Ability to identify and utilize available resources and services will improve Outcome: Progressing Goal: Ability to manage health-related needs will improve Outcome: Progressing

## 2023-05-05 NOTE — ED Notes (Signed)
 Interpreter contacted for a [place] translator (Pania Po [phone])

## 2023-05-05 NOTE — ED Notes (Signed)
 Contacted Interpreter Ship broker) regarding medication education.

## 2023-05-06 DIAGNOSIS — G9341 Metabolic encephalopathy: Secondary | ICD-10-CM | POA: Diagnosis not present

## 2023-05-06 DIAGNOSIS — E162 Hypoglycemia, unspecified: Secondary | ICD-10-CM | POA: Diagnosis not present

## 2023-05-06 LAB — CBC WITH DIFFERENTIAL/PLATELET
Abs Immature Granulocytes: 0.07 10*3/uL (ref 0.00–0.07)
Basophils Absolute: 0 10*3/uL (ref 0.0–0.1)
Basophils Relative: 1 %
Eosinophils Absolute: 0.8 10*3/uL — ABNORMAL HIGH (ref 0.0–0.5)
Eosinophils Relative: 11 %
HCT: 35.6 % — ABNORMAL LOW (ref 36.0–46.0)
Hemoglobin: 11.2 g/dL — ABNORMAL LOW (ref 12.0–15.0)
Immature Granulocytes: 1 %
Lymphocytes Relative: 41 %
Lymphs Abs: 3.1 10*3/uL (ref 0.7–4.0)
MCH: 29.7 pg (ref 26.0–34.0)
MCHC: 31.5 g/dL (ref 30.0–36.0)
MCV: 94.4 fL (ref 80.0–100.0)
Monocytes Absolute: 0.4 10*3/uL (ref 0.1–1.0)
Monocytes Relative: 6 %
Neutro Abs: 3 10*3/uL (ref 1.7–7.7)
Neutrophils Relative %: 40 %
Platelets: 202 10*3/uL (ref 150–400)
RBC: 3.77 MIL/uL — ABNORMAL LOW (ref 3.87–5.11)
RDW: 11.7 % (ref 11.5–15.5)
WBC: 7.4 10*3/uL (ref 4.0–10.5)
nRBC: 0 % (ref 0.0–0.2)

## 2023-05-06 LAB — COMPREHENSIVE METABOLIC PANEL WITH GFR
ALT: 13 U/L (ref 0–44)
AST: 20 U/L (ref 15–41)
Albumin: 3.3 g/dL — ABNORMAL LOW (ref 3.5–5.0)
Alkaline Phosphatase: 69 U/L (ref 38–126)
Anion gap: 11 (ref 5–15)
BUN: 48 mg/dL — ABNORMAL HIGH (ref 8–23)
CO2: 25 mmol/L (ref 22–32)
Calcium: 9.1 mg/dL (ref 8.9–10.3)
Chloride: 105 mmol/L (ref 98–111)
Creatinine, Ser: 1.66 mg/dL — ABNORMAL HIGH (ref 0.44–1.00)
GFR, Estimated: 31 mL/min — ABNORMAL LOW (ref >60)
Glucose, Bld: 140 mg/dL — ABNORMAL HIGH (ref 70–99)
Potassium: 4.2 mmol/L (ref 3.5–5.1)
Sodium: 141 mmol/L (ref 135–145)
Total Bilirubin: 0.3 mg/dL (ref 0.0–1.2)
Total Protein: 7.3 g/dL (ref 6.5–8.1)

## 2023-05-06 LAB — GLUCOSE, CAPILLARY
Glucose-Capillary: 189 mg/dL — ABNORMAL HIGH (ref 70–99)
Glucose-Capillary: 236 mg/dL — ABNORMAL HIGH (ref 70–99)
Glucose-Capillary: 173 mg/dL — ABNORMAL HIGH (ref 70–99)
Glucose-Capillary: 235 mg/dL — ABNORMAL HIGH (ref 70–99)

## 2023-05-06 MED ORDER — SODIUM CHLORIDE 0.9 % IV SOLN: INTRAVENOUS | Status: AC

## 2023-05-06 MED ORDER — ENSURE ENLIVE PO LIQD
237.0000 mL | Freq: Two times a day (BID) | ORAL | Status: DC
Start: 2023-05-06 — End: 2023-05-08
  Administered 2023-05-06: 237 mL via ORAL

## 2023-05-06 NOTE — Care Management Obs Status (Signed)
 MEDICARE OBSERVATION STATUS NOTIFICATION   Patient Details  Name: Nichole Munoz MRN: [id number] Date of Birth: 11/05/1945   Medicare Observation Status Notification Given:  Yes    Gertha Ku, LCSW 05/06/2023, 12:38 PM

## 2023-05-06 NOTE — Plan of Care (Signed)

## 2023-05-06 NOTE — Progress Notes (Signed)
 Progress Note   Patient: Nichole Munoz NFA:[id number] DOB: 12-15-1945 DOA: 05/04/2023     0 DOS: the patient was seen and examined on 05/06/2023   Brief hospital course: Posy Savko is a 78 y.o. Khmer female with medical history significant of CVA with mild left-sided residual weakness, CKD stage IIIa, anemia, insulin -dependent type 2 diabetes, hypertension, hyperlipidemia, asthma, hospital admission for vasovagal syncope in January 2025 presents to the ED via EMS for evaluation of altered mental status and hypoglycemia.  Patient lives with family members and sleeps on a mattress on the floor.  She was noted to be on the floor next to the mattress and confused by her family so EMS was called.  Upon EMS arrival, patient found to be hypoglycemic with initial blood glucose of 32.  Dextrose  was administered and repeat CBG 191.  On arrival to the ED, patient was hypertensive.  Hypothermic with temperature 93.6 F and placed on [employer]  Remained hypoglycemic to the 50s in the ED despite being given an amp of D50 and subsequently started on D5-LR infusion at 40 mL/h. No leukocytosis, hemoglobin 11.0 (stable), MCV 89.9, creatinine 1.0 (stable), magnesium  1.4, TSH and free T4 normal, blood cultures in process, UA not suggestive of infection, COVID/influenza/RSV PCR negative, lactate normal x 2.  CT head negative for acute intracranial abnormality.  Chest x-ray showing no active disease.  EKG showing sinus rhythm/difficult to read due to significant artifact. IV mag 2 g given .  Patient's hypoglycemia resolved tolerating diet well currently placed on sliding scale insulin .  Hypothermia also improved.  However patient's creatinine continues to trend up despite good p.o. intake.  Although the creatinine is currently on the higher end of normal range.Started the patient on IV fluids today reassess in 24 hours.  If creatinine improves and is stable patient could most likely be discharged tomorrow.  PT consulted.  Assessment  and Plan:  Hypoglycemia Type 2 diabetes According to the patient, she is not taking insulin  or any other medications at home.  She reports chronically poor appetite -eats snacks/few teaspoons of rice but not full meals.  Supposed to be on appetite stimulant mirtazapine  but not taking any meds.  Glucose initially in the 30s with EMS.  Started on D5-LR infusion in the ED due to recurrent hypoglycemia.  Continue to monitor CBG every 2 hours for now and encourage p.o. intake.  Dietitian consulted and continue mirtazapine .  Check A1c.   - Dextrose  containing fluids stopped - Patient is able to take p.o. intake - Currently on carb controlled diet, sliding scale insulin  sugars fairly well-controlled.  No more episodes of hypoglycemia   Acute metabolic encephalopathy Due to hypoglycemia.  Mental status has now improved.  Patient is awake and alert, answering questions.  No obvious focal neurologic deficit on exam.  CT head negative for acute intracranial abnormality. -Likely secondary to hypoglycemia, hypothermia, mental status improved continue to monitor with neurochecks. -Metabolic encephalopathy resolved   Hypothermia Now resolved.  No infectious signs or symptoms.  TSH and free T4 normal. ?Adrenal insufficiency given hypoglycemia but no hypotension, check cortisol level.  Follow-up blood cultures and check procalcitonin level.   -Resolved continue to encourage p.o. intake   Hypomagnesemia Replace magnesium  and continue to monitor labs.-Check magnesium  level in the morning.  History of CVA  Hyperlipidemia Continue Crestor .   CKD stage IIIa Creatinine trending up.  Although the creatinine is high end of normal.  Continue with IV fluids 24 hours.   Chronic normocytic anemia Hemoglobin  stable.   Hypertension SBP currently in the 140s.  Continue amlodipine .   Asthma Stable, no signs of acute exacerbation.  Continue albuterol  PRN.   Chronic pain/neuropathy Continue gabapentin .   DVT  prophylaxis: Lovenox       Subjective: Patient was seen and examined at bedside today.  Patient has no new complaints.  Physical Exam: Vitals:   05/05/23 2032 05/05/23 2313 05/06/23 0341 05/06/23 1216  BP: (!) 140/70 (!) 110/57 (!) 161/78 (!) 180/90  Pulse: 84 73 76 82  Resp: 20 17 20 20   Temp: 98 F (36.7 C) 97.9 F (36.6 C) (!) 97.5 F (36.4 C) 98 F (36.7 C)  TempSrc: Oral Oral  Oral  SpO2: 98% 97% 99% 100%  Weight:      Height:       Constitutional:      General: She is not in acute distress. HENT:     Head: Normocephalic and atraumatic.  Eyes:     Extraocular Movements: Extraocular movements intact.  Cardiovascular:     Rate and Rhythm: Normal rate and regular rhythm.     Pulses: Normal pulses.  Pulmonary:     Effort: Pulmonary effort is normal. No respiratory distress.     Breath sounds: Normal breath sounds. No wheezing or rales.  Abdominal:     General: Bowel sounds are normal. There is no distension.     Palpations: Abdomen is soft.     Tenderness: There is no abdominal tenderness. There is no guarding.  Musculoskeletal:     Cervical back: Normal range of motion.     Right lower leg: No edema.     Left lower leg: No edema.  Skin:    General: Skin is warm and dry.  Neurological:     General: No focal deficit present.     Mental Status: She is alert and oriented to person, place, and time.     Cranial Nerves: No cranial nerve deficit.     Sensory: No sensory deficit.     Motor: No weakness. Data Reviewed:  CMP  Family Communication: Alert and oriented.  Disposition: Status is: Observation The patient will require care spanning > 2 midnights and should be moved to inpatient because: Creatinine trending up  Planned Discharge Destination:  To be decided    Time spent: 35 minutes  Author: Edwena Graham, MD 05/06/2023 2:08 PM  For on call review [email or web address].

## 2023-05-06 NOTE — Progress Notes (Signed)
 Initial Nutrition Assessment  DOCUMENTATION CODES:   Not applicable  INTERVENTION:  - Carb Modified diet.  - Ensure Plus High Protein po BID, each supplement provides 350 kcal and 20 grams of protein. - Encourage intake at all meals and of supplements.  - Monitor weight trends.   NUTRITION DIAGNOSIS:   Increased nutrient needs related to chronic illness as evidenced by estimated needs.  GOAL:   Patient will meet greater than or equal to 90% of their needs  MONITOR:   PO intake, Supplement acceptance, Weight trends  REASON FOR ASSESSMENT:   Consult Assessment of nutrition requirement/status  ASSESSMENT:   78 y.o. Khmer female with PMH significant of CVA with mild left-sided residual weakness, CKD stage IIIa, T2DM, HTN, HLD, asthma who presented for evaluation of AMS and hypoglycemia.  Patient noted to be admitted for AMS and only oriented to self this AM. Also noted to speak Khmer. No family at bedside at time of visit and patient sleeping.   Per chart review, patient was weighed at 125# in January and weighed earlier this month at 100#. This could indicate significant weight loss of 25# or 20% in 3.5 months if weights are accurate. However, weight from January is exactly the same as weight from May 2024, so question if weight was copied.   Per MD notes, patient reportedly has a chronically poor appetite and only eats snacks and not full meals. Supposed to be on an appetite stimulant but not taking any medications at home.   Patient is ordered a carb modified diet and noted to be consuming 100% of meals since admit. She is on automatic trays to ensure she receives 3 meals a day. Will add ONS to support oral intake.    Medications reviewed and include: Remeron   Labs reviewed:  Creatinine 1.66 HA1C 7.0 Blood Glucose 108-258 x24 hours   NUTRITION - FOCUSED PHYSICAL EXAM:  Patient with AMS and sleeping, will attempt at next visit  Diet Order:   Diet Order              Diet Carb Modified Fluid consistency: Thin; Room service appropriate? Yes  Diet effective now                   EDUCATION NEEDS:  No education needs have been identified at this time  Skin:  Skin Assessment: Reviewed RN Assessment  Last BM:  PTA  Height:  Ht Readings from Last 1 Encounters:  05/04/23 5' (1.524 m)   Weight:  Wt Readings from Last 1 Encounters:  05/04/23 45.4 kg    BMI:  Body mass index is 19.53 kg/m.  Estimated Nutritional Needs:  Kcal:  1350-1500 kcals Protein:  65-75 grams Fluid:  >/= 1.5L    Scheryl Cushing RD, LDN Contact via Secure Chat.

## 2023-05-06 NOTE — TOC Initial Note (Addendum)
 Transition of Care [facility]) - Initial/Assessment Note    Patient Details  Name: Nichole Munoz MRN: [id number] Date of Birth: 11/06/1945  Transition of Care [place]) CM/SW Contact:    Nichole Ku, LCSW Phone Number: 05/06/2023, 9:58 AM  Clinical Narrative:                 CSW met with pt at bedside to discuss MOON, using interpreter , pt  only oriented to self . Received permission to speak with daughter. CSW attempted to contact pt's daughter ,no answer left VM requesting return call to discuss MOON. CSW attempted to contact pt's son number is unable to accept calls. TOC to follow.   Adden  12:30pm CSW spoke with the pt's daughter, Nichole Munoz. Pt is from home and lives with her son and another daughter. [name] reported that the pt uses a walker or a cane, but she is not sure if the pt still has it. [name] is requesting a new walker, if possible. CSW explained that the pt can receive a walker if she has not received one within the last five years. Pt will need orders placed for the walker, and the MD has been made aware. [name] has no preference for the DME company. A referral will be sent to [facility] for the walker to be delivered to the pt's room prior to discharge. [name] stated she will provide transportation home upon d/c.  TOC to follow.  Expected Discharge Plan: Home/Self Care Barriers to Discharge: Continued Medical Work up   Patient Goals and CMS Choice            Expected Discharge Plan and Services                                              Prior Living Arrangements/Services                       Activities of Daily Living   ADL Screening (condition at time of admission) Independently performs ADLs?: Yes (appropriate for developmental age) Is the patient deaf or have difficulty hearing?: No Does the patient have difficulty seeing, even when wearing glasses/contacts?: Yes Does the patient have difficulty concentrating,  remembering, or making decisions?: No  Permission Sought/Granted                  Emotional Assessment              Admission diagnosis:  Hypomagnesemia [E83.42] Hypoglycemia [E16.2] Altered mental status, unspecified altered mental status type [R41.82] Patient Active Problem List   Diagnosis Date Noted   Hypoglycemia 05/05/2023   Hypothermia 05/05/2023   Syncope 01/11/2023   Hypertensive urgency 01/11/2023   Uncontrolled type 2 diabetes mellitus with hyperglycemia, with long-term current use of insulin  (HCC) 01/11/2023   History of CVA with residual deficit 01/11/2023   Malnutrition of moderate degree 07/27/2021   Akathisia 07/25/2021   GERD without esophagitis 07/24/2021   Acute medication-induced akathisia 07/23/2021   Type 2 diabetes mellitus (HCC) 01/24/2017   Essential hypertension 01/24/2017   Asthma due to seasonal allergies 01/24/2017   Acute metabolic encephalopathy 04/28/2016   Acute renal failure superimposed on stage 3a chronic kidney disease (HCC) 04/26/2016   Hypomagnesemia 09/24/2014   Stage 3a chronic kidney disease (CKD) (HCC) - baseline SCr 1.4 09/23/2014   Hyperlipidemia 09/23/2014  Diabetic neuropathy (HCC) 04/12/2013   PCP:  [facility]:   [id number] [place], [place] - [address] AT [facility] [address] [place] [address] Phone: [phone] Fax: [phone]  [facility] [address] [place] [address] Phone: [phone] Fax: [phone]  [facility] [address] [place] [address] Phone: [phone] Fax: [phone]     Social Drivers of Health (SDOH) Social History: SDOH Screenings   Food Insecurity: No Food Insecurity (05/05/2023)  Housing: Low Risk  (05/05/2023)  Transportation Needs: No Transportation Needs (05/05/2023)  Utilities: Not At Risk (05/05/2023)  Financial  Resource Strain: Not on File (04/25/2021)   Received from [place], [place]  Physical Activity: Not on File (04/25/2021)   Received from [place], [place]  Social Connections: Moderately Isolated (05/05/2023)  Stress: Not on File (04/25/2021)   Received from [facility], [place]  Tobacco Use: Low Risk  (05/04/2023)   SDOH Interventions:     Readmission Risk Interventions     No data to display

## 2023-05-07 DIAGNOSIS — E1101 Type 2 diabetes mellitus with hyperosmolarity with coma: Secondary | ICD-10-CM | POA: Diagnosis not present

## 2023-05-07 DIAGNOSIS — G9341 Metabolic encephalopathy: Secondary | ICD-10-CM | POA: Diagnosis not present

## 2023-05-07 DIAGNOSIS — N1832 Chronic kidney disease, stage 3b: Secondary | ICD-10-CM | POA: Diagnosis not present

## 2023-05-07 DIAGNOSIS — I10 Essential (primary) hypertension: Secondary | ICD-10-CM

## 2023-05-07 DIAGNOSIS — Z794 Long term (current) use of insulin: Secondary | ICD-10-CM

## 2023-05-07 DIAGNOSIS — E11649 Type 2 diabetes mellitus with hypoglycemia without coma: Secondary | ICD-10-CM | POA: Diagnosis not present

## 2023-05-07 DIAGNOSIS — E782 Mixed hyperlipidemia: Secondary | ICD-10-CM

## 2023-05-07 DIAGNOSIS — E1169 Type 2 diabetes mellitus with other specified complication: Secondary | ICD-10-CM | POA: Diagnosis not present

## 2023-05-07 LAB — CBC WITH DIFFERENTIAL/PLATELET
Abs Immature Granulocytes: 0.03 10*3/uL (ref 0.00–0.07)
Basophils Absolute: 0 10*3/uL (ref 0.0–0.1)
Basophils Relative: 0 %
Eosinophils Absolute: 0.8 10*3/uL — ABNORMAL HIGH (ref 0.0–0.5)
Eosinophils Relative: 11 %
HCT: 31.7 % — ABNORMAL LOW (ref 36.0–46.0)
Hemoglobin: 10.1 g/dL — ABNORMAL LOW (ref 12.0–15.0)
Immature Granulocytes: 0 %
Lymphocytes Relative: 42 %
Lymphs Abs: 2.9 10*3/uL (ref 0.7–4.0)
MCH: 30.2 pg (ref 26.0–34.0)
MCHC: 31.9 g/dL (ref 30.0–36.0)
MCV: 94.9 fL (ref 80.0–100.0)
Monocytes Absolute: 0.5 10*3/uL (ref 0.1–1.0)
Monocytes Relative: 7 %
Neutro Abs: 2.8 10*3/uL (ref 1.7–7.7)
Neutrophils Relative %: 40 %
Platelets: 192 10*3/uL (ref 150–400)
RBC: 3.34 MIL/uL — ABNORMAL LOW (ref 3.87–5.11)
RDW: 11.6 % (ref 11.5–15.5)
WBC: 7 10*3/uL (ref 4.0–10.5)
nRBC: 0 % (ref 0.0–0.2)

## 2023-05-07 LAB — COMPREHENSIVE METABOLIC PANEL WITH GFR
ALT: 25 U/L (ref 0–44)
AST: 44 U/L — ABNORMAL HIGH (ref 15–41)
Albumin: 2.9 g/dL — ABNORMAL LOW (ref 3.5–5.0)
Alkaline Phosphatase: 67 U/L (ref 38–126)
Anion gap: 12 (ref 5–15)
BUN: 51 mg/dL — ABNORMAL HIGH (ref 8–23)
CO2: 22 mmol/L (ref 22–32)
Calcium: 8.8 mg/dL — ABNORMAL LOW (ref 8.9–10.3)
Chloride: 108 mmol/L (ref 98–111)
Creatinine, Ser: 1.66 mg/dL — ABNORMAL HIGH (ref 0.44–1.00)
GFR, Estimated: 31 mL/min — ABNORMAL LOW (ref >60)
Glucose, Bld: 146 mg/dL — ABNORMAL HIGH (ref 70–99)
Potassium: 4.8 mmol/L (ref 3.5–5.1)
Sodium: 142 mmol/L (ref 135–145)
Total Bilirubin: 0.3 mg/dL (ref 0.0–1.2)
Total Protein: 6.3 g/dL — ABNORMAL LOW (ref 6.5–8.1)

## 2023-05-07 LAB — GLUCOSE, CAPILLARY
Glucose-Capillary: 149 mg/dL — ABNORMAL HIGH (ref 70–99)
Glucose-Capillary: 185 mg/dL — ABNORMAL HIGH (ref 70–99)
Glucose-Capillary: 161 mg/dL — ABNORMAL HIGH (ref 70–99)
Glucose-Capillary: 196 mg/dL — ABNORMAL HIGH (ref 70–99)

## 2023-05-07 MED ORDER — FLUTICASONE FUROATE-VILANTEROL 200-25 MCG/ACT IN AEPB
1.0000 | INHALATION_SPRAY | Freq: Every day | RESPIRATORY_TRACT | Status: DC
  Administered 2023-05-07 – 2023-05-08 (×2): 1 via RESPIRATORY_TRACT
  Filled 2023-05-07: qty 28

## 2023-05-07 MED ORDER — ASPIRIN 81 MG PO CHEW
81.0000 mg | CHEWABLE_TABLET | Freq: Every day | ORAL | Status: DC
  Administered 2023-05-07 – 2023-05-08 (×2): 81 mg via ORAL
  Filled 2023-05-07: qty 1

## 2023-05-07 NOTE — Assessment & Plan Note (Signed)
 Replaced

## 2023-05-07 NOTE — Assessment & Plan Note (Signed)
 Continue amlodipine

## 2023-05-07 NOTE — Hospital Course (Signed)
 78 y.o female with medical history significant of CVA with mild left-sided residual weakness, CKD stage IIIa, anemia, insulin -dependent type 2 diabetes, hypertension, hyperlipidemia, asthma, hospital admission for vasovagal syncope in January 2025 presents to the ED via EMS for evaluation of altered mental status and hypoglycemia.   Upon evaluation in the emergency department patient was found to be hypothermic and hypoglycemic.  Patient was initiated on a Bair hugger and administered D50 followed by D5 LR infusion.  Due to to unrelenting hypothermia and hypoglycemia of unknown etiology the hospitalist group was then called to assess the patient for admission to the hospital.  Patient's hypothermia and hypoglycemia gradually resolved with supportive care.  Patient was transitioned off of the D5 LR infusion.  After thorough evaluation of the patient and interviewing the patient it seems that the patient developed severe hypoglycemia shortly after injecting himself with insulin .  Patient and family additionally have reported that as of late the patient has not been eating much.  It is therefore felt that the patient's severe hyperglycemia is likely secondary to misuse of the insulin  or that the patient may no longer need insulin  in the setting of poor oral intake.  The days that followed patient's hypoglycemia and hypothermia completely resolved.  Patient's strength returned to baseline.  Patient was discharged home in improved and stable condition on 05/08/2023.  At time of discharge, patient's home regimen of insulin  aspart and insulin  glargine were discontinued.

## 2023-05-07 NOTE — Plan of Care (Signed)
  Problem: Coping: Goal: Ability to adjust to condition or change in health will improve Outcome: Progressing   Problem: Fluid Volume: Goal: Ability to maintain a balanced intake and output will improve Outcome: Progressing   Problem: Metabolic: Goal: Ability to maintain appropriate glucose levels will improve Outcome: Progressing   Problem: Coping: Goal: Level of anxiety will decrease Outcome: Progressing

## 2023-05-07 NOTE — Assessment & Plan Note (Signed)
 Resolved, unclear etiology, see remainder of assessment and plan above.

## 2023-05-07 NOTE — Assessment & Plan Note (Signed)
 Continue statin therapy.

## 2023-05-07 NOTE — Evaluation (Signed)
 Occupational Therapy Evaluation Patient Details Name: Nichole Munoz MRN: [id number] DOB: 1945/11/03 Today's Date: 05/07/2023   History of Present Illness   Nichole Munoz is a 78 yo female who presented to [facility] ED via EMS with AMS and hypoglycemia.  PMH significant for recent syncopal episode 1/25, DM, neuropathy, HTN, HLD, arthritis, CKD IIIa, CVA w/ Lt residual weakness, C7 fx in 2018.     Clinical Impressions  PTA, patient lives with son and daughter in apartment who both work but as per patient vary schedule to provide assistance as needed. [place] Interpreter utilized this session however no family at bedside to confirm PLOF and patient somewhat inconsistent historian. Currently, patient presents with deficits outlined below (see OT Problem list for details) most significantly L sided>R sided weakness, most likely close to baseline, decreased activity tolerance and balance as well as decreased safety skills. OT recommends family support and supervision with HHOT services and 3 in 1 commode upon discharge. OT will continue to follow acutely to progress functional safety and performance.     If plan is discharge home, recommend the following:   A little help with walking and/or transfers;A little help with bathing/dressing/bathroom;Assistance with cooking/housework;Direct supervision/assist for medications management;Direct supervision/assist for financial management;Assist for transportation;Help with stairs or ramp for entrance;Supervision due to cognitive status     Functional Status Assessment   Patient has had a recent decline in their functional status and demonstrates the ability to make significant improvements in function in a reasonable and predictable amount of time.     Equipment Recommendations   BSC/3in1      Precautions/Restrictions   Precautions Precautions: Fall Restrictions Weight Bearing Restrictions Per Provider Order: No     Mobility Bed Mobility Overal bed  mobility: Needs Assistance Bed Mobility: Rolling, Supine to Sit, Sit to Supine Rolling: Supervision, Used rails   Supine to sit: Supervision, HOB elevated, Used rails Sit to supine: Supervision, HOB elevated, Used rails        Transfers Overall transfer level: Needs assistance Equipment used: Rolling walker (2 wheels) Transfers: Sit to/from Stand, Bed to chair/wheelchair/BSC Sit to Stand: Supervision     Step pivot transfers: Supervision     General transfer comment: cues for RW safety amb to and from bathroom      Balance Overall balance assessment: Mild deficits observed, not formally tested                                         ADL either performed or assessed with clinical judgement   ADL Overall ADL's : Needs assistance/impaired Eating/Feeding: Independent;Sitting   Grooming: Wash/dry hands;Wash/dry face;Oral care   Upper Body Bathing: Set up;Sitting   Lower Body Bathing: Contact guard assist;Sitting/lateral leans;Sit to/from stand   Upper Body Dressing : Set up;Sitting   Lower Body Dressing: Contact guard assist;Sitting/lateral leans;Sit to/from stand   Toilet Transfer: Rolling walker (2 wheels);Cueing for safety;Supervision/safety   Toileting- Clothing Manipulation and Hygiene: Set up;Sit to/from stand;Sitting/lateral lean       Functional mobility during ADLs: Rolling walker (2 wheels);Supervision/safety General ADL Comments: cues for safety     Vision Baseline Vision/History: 1 Wears glasses Ability to See in Adequate Light: 1 Impaired Patient Visual Report: Blurring of vision;No change from baseline Vision Assessment?: Wears glasses for reading Additional Comments: patient reportsvision has been blurry for many years     Perception Perception: Impaired Preception Impairment Details: Spatial orientation  Praxis Praxis: Impaired Praxis Impairment Details: Motor planning, Organization     Pertinent Vitals/Pain Pain  Assessment Pain Assessment: Faces Faces Pain Scale: Hurts a little bit Pain Location: "my whole body" via interpreter Pain Descriptors / Indicators: Sore Pain Intervention(s): Monitored during session, Repositioned     Extremity/Trunk Assessment Upper Extremity Assessment Upper Extremity Assessment: Defer to OT evaluation LUE Deficits / Details: edema and poor motor control with + weakness 3-/5 LUE Coordination: decreased fine motor;decreased gross motor   Lower Extremity Assessment Lower Extremity Assessment: LLE deficits/detail LLE Deficits / Details: strength 3+/5 knee flexion, knee extension, hip flexion and ankle plantar flexion and dorsiflexion; decreased motor control LLE Coordination: decreased fine motor;decreased gross motor   Cervical / Trunk Assessment Cervical / Trunk Assessment: Normal   Communication Communication Communication: Other (comment) (interpreter Ly [id number] (call failed) then S[place]ara [id number])   Cognition Arousal: Alert Behavior During Therapy: Impulsive Cognition: Cognition impaired   Orientation impairments: Time Awareness: Intellectual awareness impaired, Online awareness impaired Memory impairment (select all impairments): Short-term memory Attention impairment (select first level of impairment): Sustained attention Executive functioning impairment (select all impairments): Organization, Sequencing, Reasoning, Problem solving OT - Cognition Comments: use of [place] interpreter                 Following commands: Impaired       Cueing  General Comments   Cueing Techniques: Verbal cues;Tactile cues;Visual cues  L hand edema           Home Living Family/patient expects to be discharged to:: Private residence Living Arrangements: Children Available Help at Discharge: Family Type of Home: Apartment Home Access: Level entry     Home Layout: Two level;Bed/bath upstairs;1/2 bath on main level;Able to live on main level with  bedroom/bathroom     Bathroom Shower/Tub: Tub/shower unit;Sponge bathes at baseline   Bathroom Toilet: Standard Bathroom Accessibility: Yes How Accessible: Accessible via walker Home Equipment: Cane - single point;Rolling Walker (2 wheels)   Additional Comments: son and daughter (son works night daughter during day)      Prior Functioning/Environment Prior Level of Function : Needs assist       Physical Assist : ADLs (physical)   ADLs (physical): Grooming;Bathing;Dressing Mobility Comments: uses SPC for mobility; tells this therapist she has RW as well ADLs Comments: son/daughter assist with ADLs    OT Problem List: Decreased strength;Decreased activity tolerance;Impaired balance (sitting and/or standing);Impaired vision/perception;Decreased coordination;Decreased cognition;Decreased safety awareness;Decreased knowledge of use of DME or AE;Decreased knowledge of precautions;Impaired UE functional use;Pain;Increased edema   OT Treatment/Interventions: Self-care/ADL training;Therapeutic exercise;Neuromuscular education;Energy conservation;DME and/or AE instruction;Therapeutic activities;Cognitive remediation/compensation;Visual/perceptual remediation/compensation;Patient/family education;Balance training      OT Goals(Current goals can be found in the care plan section)   Acute Rehab OT Goals Patient Stated Goal: to go home OT Goal Formulation: With patient Time For Goal Achievement: 05/21/23 Potential to Achieve Goals: Good ADL Goals Pt Will Perform Lower Body Bathing: with supervision;sitting/lateral leans;sit to/from stand Pt Will Perform Lower Body Dressing: with supervision;sitting/lateral leans;sit to/from stand Pt Will Transfer to Toilet: with supervision;grab bars;ambulating Pt/caregiver will Perform Home Exercise Program: Increased strength;With Supervision;With written HEP provided   OT Frequency:  Min 2X/week       AM-PAC OT "6 Clicks" Daily Activity      Outcome Measure Help from another person eating meals?: None Help from another person taking care of personal grooming?: A Little Help from another person toileting, which includes using toliet, bedpan, or urinal?: A Little Help from another person bathing (including  washing, rinsing, drying)?: A Little Help from another person to put on and taking off regular upper body clothing?: A Little Help from another person to put on and taking off regular lower body clothing?: A Little 6 Click Score: 19   End of Session Equipment Utilized During Treatment: Gait belt;Rolling walker (2 wheels) Nurse Communication: Mobility status  Activity Tolerance: Patient tolerated treatment well Patient left: in bed;with call bell/phone within reach;with bed alarm set  OT Visit Diagnosis: Unsteadiness on feet (R26.81);History of falling (Z91.81);Muscle weakness (generalized) (M62.81);Ataxia, unspecified (R27.0);Other symptoms and signs involving the nervous system (R29.898);Cognitive communication deficit (R41.841);Pain;Hemiplegia and hemiparesis Symptoms and signs involving cognitive functions: Cerebral infarction Hemiplegia - Right/Left: Left Hemiplegia - caused by: Cerebral infarction                Time: 1340-1417 OT Time Calculation (min): 37 min Charges:  OT General Charges $OT Visit: 1 Visit OT Evaluation $OT Eval Moderate Complexity: 1 Mod OT Treatments $Self Care/Home Management : 8-22 mins Alexandre Faries OT/L Acute Rehabilitation Department  [phone]  05/07/2023, 4:41 PM

## 2023-05-07 NOTE — Evaluation (Signed)
 Physical Therapy Evaluation Patient Details Name: Nichole Munoz MRN: [id number] DOB: 18-May-1945 Today's Date: 05/07/2023  History of Present Illness  Nichole Munoz is a 78 yo female who presented to [facility] ED via EMS with AMS and hypoglycemia.  PMH significant for recent syncopal episode 1/25, DM, neuropathy, HTN, HLD, arthritis, CKD IIIa, CVA w/ Lt residual weakness, C7 fx in 2018.  Clinical Impression  Pt admitted with above diagnosis. Pt reports via interpreter, pt from home with family, using SPC or RW in the home, enjoys walking and being active during the day. On eval, pt noted to be somewhat impulsive, ataxic-like LLE step progression at times, ambulates in room completing turns often and needing multimodal cues to maintain body within RW frame and for pacing. Pt with LLE weakness compared to R, but able to dorsiflex and complete symmetrical steps and stance time. Recommend HHPT with family support at d/c, has DME at home. Pt currently with functional limitations due to the deficits listed below (see PT Problem List). Pt will benefit from acute skilled PT to increase their independence and safety with mobility to allow discharge.           If plan is discharge home, recommend the following: A little help with walking and/or transfers;A little help with bathing/dressing/bathroom;Assistance with cooking/housework;Assist for transportation   Can travel by private vehicle        Equipment Recommendations None recommended by PT  Recommendations for Other Services       Functional Status Assessment Patient has had a recent decline in their functional status and demonstrates the ability to make significant improvements in function in a reasonable and predictable amount of time.     Precautions / Restrictions Precautions Precautions: Fall Restrictions Weight Bearing Restrictions Per Provider Order: No      Mobility  Bed Mobility               General bed mobility comments: in recliner upon  arrival    Transfers Overall transfer level: Needs assistance Equipment used: Rolling walker (2 wheels) Transfers: Sit to/from Stand Sit to Stand: Supervision           General transfer comment: rocking momentum to power up, BUE assisting to push up    Ambulation/Gait Ambulation/Gait assistance: Contact guard assist, Min assist Gait Distance (Feet): 40 Feet Assistive device: Rolling walker (2 wheels) Gait Pattern/deviations: Step-through pattern, Decreased stride length, Ataxic       General Gait Details: pt amb in room, multimodal cues for RW management closer to body, equal step length, no foot drop noted, ataxic-like step progression with LLE at times but also somewhat impulsive with pacing and completing turns, no overt LOB, min A to CGA for steadying with RW  Stairs            Wheelchair Mobility     Tilt Bed    Modified Rankin (Stroke Patients Only)       Balance Overall balance assessment: Mild deficits observed, not formally tested                                           Pertinent Vitals/Pain Pain Assessment Pain Assessment: Faces Faces Pain Scale: Hurts little more Pain Location: "my whole body" via interpreter Pain Descriptors / Indicators: Grimacing Pain Intervention(s): Limited activity within patient's tolerance, Monitored during session, Repositioned    Home Living Family/patient expects to be discharged  to:: Private residence Living Arrangements: Children Available Help at Discharge: Family Type of Home: Apartment Home Access: Level entry       Home Layout: Two level;Bed/bath upstairs;1/2 bath on main level;Able to live on main level with bedroom/bathroom Home Equipment: Jeananne Mighty - single point;Rolling Environmental consultant (2 wheels) Additional Comments: son and daughter (son works night daughter during day)    Prior Function Prior Level of Function : Needs assist       Physical Assist : ADLs (physical)   ADLs (physical):  Grooming;Bathing;Dressing Mobility Comments: uses SPC for mobility; tells this therapist she has RW as well ADLs Comments: son/daughter assist with ADLs     Extremity/Trunk Assessment   Upper Extremity Assessment Upper Extremity Assessment: Defer to OT evaluation LUE Deficits / Details: edema and poor motor control with + weakness 3-/5 LUE Coordination: decreased fine motor;decreased gross motor    Lower Extremity Assessment Lower Extremity Assessment: LLE deficits/detail LLE Deficits / Details: strength 3+/5 knee flexion, knee extension, hip flexion and ankle plantar flexion and dorsiflexion; decreased motor control LLE Coordination: decreased fine motor;decreased gross motor    Cervical / Trunk Assessment Cervical / Trunk Assessment: Normal  Communication   Communication Communication: Other (comment) (interpreter Ly [id number] (call failed) then [place] [id number])    Cognition Arousal: Alert Behavior During Therapy: Impulsive                             Following commands: Impaired       Cueing Cueing Techniques: Verbal cues, Tactile cues, Visual cues     General Comments      Exercises     Assessment/Plan    PT Assessment Patient needs continued PT services  PT Problem List Decreased strength;Decreased activity tolerance;Decreased balance;Decreased mobility;Decreased knowledge of use of DME;Decreased safety awareness;Decreased knowledge of precautions;Impaired tone       PT Treatment Interventions DME instruction;Gait training;Stair training;Functional mobility training;Therapeutic activities;Therapeutic exercise;Balance training;Neuromuscular re-education;Cognitive remediation;Patient/family education    PT Goals (Current goals can be found in the Care Plan section)  Acute Rehab PT Goals Patient Stated Goal: wants to walk PT Goal Formulation: With patient Time For Goal Achievement: 05/21/23 Potential to Achieve Goals: Good    Frequency Min  3X/week     Co-evaluation               AM-PAC PT "6 Clicks" Mobility  Outcome Measure Help needed turning from your back to your side while in a flat bed without using bedrails?: A Little Help needed moving from lying on your back to sitting on the side of a flat bed without using bedrails?: A Little Help needed moving to and from a bed to a chair (including a wheelchair)?: A Little Help needed standing up from a chair using your arms (e.g., wheelchair or bedside chair)?: A Little Help needed to walk in hospital room?: A Little Help needed climbing 3-5 steps with a railing? : A Little 6 Click Score: 18    End of Session Equipment Utilized During Treatment: Gait belt Activity Tolerance: Patient tolerated treatment well Patient left: in chair;with call bell/phone within reach;with chair alarm set (NT replacing batteries) Nurse Communication: Mobility status (NT replacing batteries) PT Visit Diagnosis: Unsteadiness on feet (R26.81);Other abnormalities of gait and mobility (R26.89);Hemiplegia and hemiparesis;Muscle weakness (generalized) (M62.81) Hemiplegia - Right/Left: Left    Time: 5409-8119 PT Time Calculation (min) (ACUTE ONLY): 19 min   Charges:   PT Evaluation $PT Eval Moderate Complexity: 1 Mod  PT General Charges $$ ACUTE PT VISIT: 1 Visit         Tori Dennies Coate PT, DPT 05/07/23, 3:57 PM

## 2023-05-07 NOTE — Assessment & Plan Note (Signed)
 Patient presenting with lethargy and confusion in the setting of hypothermia and hypoglycemia Mentation seems to have improved to baseline with resolution of hypothermia and hypoglycemia. Etiology of the hypothermia and hypoglycemia not entirely clear.  No obvious evidence of infection.  No reported new causative prescription medications TSH unremarkable Continue to monitor overnight, likely discharge in the morning.

## 2023-05-07 NOTE — Progress Notes (Signed)
 PROGRESS NOTE   Nichole Munoz  EAV:[id number] DOB: [birth year] DOA: 05/04/2023 PCP: [facility]   Date of Service: the patient was seen and examined on 05/07/2023  Brief Narrative:  78 y.o female with medical history significant of CVA with mild left-sided residual weakness, CKD stage IIIa, anemia, insulin -dependent type 2 diabetes, hypertension, hyperlipidemia, asthma, hospital admission for vasovagal syncope in January 2025 presents to the ED via EMS for evaluation of altered mental status and hypoglycemia.    Assessment & Plan Acute metabolic encephalopathy Patient presenting with lethargy and confusion in the setting of hypothermia and hypoglycemia Mentation seems to have improved to baseline with resolution of hypothermia and hypoglycemia. Etiology of the hypothermia and hypoglycemia not entirely clear.  No obvious evidence of infection.  No reported new causative prescription medications TSH unremarkable Continue to monitor overnight, likely discharge in the morning. Uncontrolled type 2 diabetes mellitus with hypoglycemia, without long-term current use of insulin  (HCC) Insulin  noted on patient's home medication list although patient denies taking. Accu-Cheks before every meal and nightly with sliding scale insulin , low intensity Hemoglobin A1c 7.0%, down from 12.2% 1 year ago with no medication suggestive of progressively decreasing blood sugar in the home setting Hypothermia Resolved, unclear etiology, see remainder of assessment and plan above. Hypomagnesemia Replaced Chronic kidney disease, stage 3b (HCC) Creatinine 1.66 which seems to have stabilized and is likely near patient's baseline. Strict input and output monitoring Avoid nephrotoxic agents Monitor renal function and electrolytes with serial chemistries Mixed diabetic hyperlipidemia associated with type 2 diabetes mellitus (HCC) Continue statin therapy Essential hypertension Continue amlodipine       Subjective:  Patient states that she is "feeling much better."  When asked for details about the episode that precipitated her presentation, she states "I have no idea I just passed out."  Physical Exam:  Vitals:   05/07/23 0940 05/07/23 1100 05/07/23 1222 05/07/23 2116  BP: (!) 178/91 (!) 180/75  (!) 120/51  Pulse:  85  88  Resp:    18  Temp:  97.9 F (36.6 C)  98.8 F (37.1 C)  TempSrc:  Oral    SpO2:  97% 97% 97%  Weight:      Height:        Constitutional: Awake alert and oriented x3, no associated distress.   Skin: no rashes, no lesions, good skin turgor noted. Eyes: Pupils are equally reactive to light.  No evidence of scleral icterus or conjunctival pallor.  ENMT: Moist mucous membranes noted.  Posterior pharynx clear of any exudate or lesions.   Respiratory: clear to auscultation bilaterally, no wheezing, no crackles. Normal respiratory effort. No accessory muscle use.  Cardiovascular: Regular rate and rhythm, no murmurs / rubs / gallops. No extremity edema. 2+ pedal pulses. No carotid bruits.  Abdomen: Abdomen is soft and nontender.  No evidence of intra-abdominal masses.  Positive bowel sounds noted in all quadrants.   Musculoskeletal: No joint deformity upper and lower extremities. Good ROM, no contractures. Normal muscle tone.    Data Reviewed:  I have personally reviewed and interpreted labs, imaging.  Significant findings are   CBC: Recent Labs  Lab 05/04/23 2215 05/05/23 1126 05/06/23 0408 05/07/23 0430  WBC 9.7 7.2 7.4 7.0  NEUTROABS 8.4* 4.0 3.0 2.8  HGB 11.0* 10.6* 11.2* 10.1*  HCT 33.1* 32.7* 35.6* 31.7*  MCV 89.9 92.9 94.4 94.9  PLT 217 189 202 192   Basic Metabolic Panel: Recent Labs  Lab 05/04/23 2215 05/05/23 0758 05/05/23 1126 05/06/23  0408 05/07/23 0430  NA 139  --  139 141 142  K 3.7  --  4.1 4.2 4.8  CL 103  --  104 105 108  CO2 26  --  23 25 22   GLUCOSE 74  --  242* 140* 146*  BUN 36*  --  35* 48* 51*  CREATININE 1.08*   --  1.59* 1.66* 1.66*  CALCIUM  8.6*  --  8.0* 9.1 8.8*  MG 1.4* 1.9  --   --   --    GFR: Estimated Creatinine Clearance: 20 mL/min (A) (by C-G formula based on SCr of 1.66 mg/dL (H)). Liver Function Tests: Recent Labs  Lab 05/04/23 2215 05/05/23 1126 05/06/23 0408 05/07/23 0430  AST 32 23 20 44*  ALT 15 12 13 25   ALKPHOS 77 67 69 67  BILITOT 0.5 0.5 0.3 0.3  PROT 8.2* 6.8 7.3 6.3*  ALBUMIN 3.7 3.1* 3.3* 2.9*    Code Status:  Full code.  Code status decision has been confirmed with: patient Family Communication: Multiple attempts have been made to contact the family via phone and have been unsuccessful.   Severity of Illness:  The appropriate patient status for this patient is OBSERVATION. Observation status is judged to be reasonable and necessary in order to provide the required intensity of service to ensure the patient's safety. The patient's presenting symptoms, physical exam findings, and initial radiographic and laboratory data in the context of their medical condition is felt to place them at decreased risk for further clinical deterioration. Furthermore, it is anticipated that the patient will be medically stable for discharge from the hospital within 2 midnights of admission.   Time spent:  51 minutes  Author:  True Fuss MD  05/07/2023 10:26 PM

## 2023-05-07 NOTE — Assessment & Plan Note (Signed)
 Insulin  noted on patient's home medication list although patient denies taking. Accu-Cheks before every meal and nightly with sliding scale insulin , low intensity Hemoglobin A1c 7.0%, down from 12.2% 1 year ago with no medication suggestive of progressively decreasing blood sugar in the home setting

## 2023-05-07 NOTE — Assessment & Plan Note (Signed)
 Creatinine 1.66 which seems to have stabilized and is likely near patient's baseline. Strict input and output monitoring Avoid nephrotoxic agents Monitor renal function and electrolytes with serial chemistries

## 2023-05-08 ENCOUNTER — Other Ambulatory Visit (HOSPITAL_COMMUNITY): Payer: Self-pay

## 2023-05-08 DIAGNOSIS — E1169 Type 2 diabetes mellitus with other specified complication: Secondary | ICD-10-CM | POA: Diagnosis not present

## 2023-05-08 DIAGNOSIS — T68XXXA Hypothermia, initial encounter: Secondary | ICD-10-CM | POA: Diagnosis not present

## 2023-05-08 DIAGNOSIS — G9341 Metabolic encephalopathy: Secondary | ICD-10-CM | POA: Diagnosis not present

## 2023-05-08 DIAGNOSIS — E782 Mixed hyperlipidemia: Secondary | ICD-10-CM | POA: Diagnosis not present

## 2023-05-08 DIAGNOSIS — N1832 Chronic kidney disease, stage 3b: Secondary | ICD-10-CM | POA: Diagnosis not present

## 2023-05-08 DIAGNOSIS — I10 Essential (primary) hypertension: Secondary | ICD-10-CM | POA: Diagnosis not present

## 2023-05-08 LAB — COMPREHENSIVE METABOLIC PANEL WITH GFR
ALT: 37 U/L (ref 0–44)
AST: 66 U/L — ABNORMAL HIGH (ref 15–41)
Albumin: 3.1 g/dL — ABNORMAL LOW (ref 3.5–5.0)
Alkaline Phosphatase: 80 U/L (ref 38–126)
Anion gap: 7 (ref 5–15)
BUN: 58 mg/dL — ABNORMAL HIGH (ref 8–23)
CO2: 24 mmol/L (ref 22–32)
Calcium: 8.6 mg/dL — ABNORMAL LOW (ref 8.9–10.3)
Chloride: 107 mmol/L (ref 98–111)
Creatinine, Ser: 2.21 mg/dL — ABNORMAL HIGH (ref 0.44–1.00)
GFR, Estimated: 22 mL/min — ABNORMAL LOW (ref >60)
Glucose, Bld: 213 mg/dL — ABNORMAL HIGH (ref 70–99)
Potassium: 4.8 mmol/L (ref 3.5–5.1)
Sodium: 138 mmol/L (ref 135–145)
Total Bilirubin: 0.5 mg/dL (ref 0.0–1.2)
Total Protein: 6.5 g/dL (ref 6.5–8.1)

## 2023-05-08 LAB — CBC WITH DIFFERENTIAL/PLATELET
Abs Immature Granulocytes: 0.03 10*3/uL (ref 0.00–0.07)
Basophils Absolute: 0 10*3/uL (ref 0.0–0.1)
Basophils Relative: 0 %
Eosinophils Absolute: 0.7 10*3/uL — ABNORMAL HIGH (ref 0.0–0.5)
Eosinophils Relative: 9 %
HCT: 29.3 % — ABNORMAL LOW (ref 36.0–46.0)
Hemoglobin: 9.7 g/dL — ABNORMAL LOW (ref 12.0–15.0)
Immature Granulocytes: 0 %
Lymphocytes Relative: 32 %
Lymphs Abs: 2.7 10*3/uL (ref 0.7–4.0)
MCH: 30.4 pg (ref 26.0–34.0)
MCHC: 33.1 g/dL (ref 30.0–36.0)
MCV: 91.8 fL (ref 80.0–100.0)
Monocytes Absolute: 0.6 10*3/uL (ref 0.1–1.0)
Monocytes Relative: 7 %
Neutro Abs: 4.2 10*3/uL (ref 1.7–7.7)
Neutrophils Relative %: 52 %
Platelets: 195 10*3/uL (ref 150–400)
RBC: 3.19 MIL/uL — ABNORMAL LOW (ref 3.87–5.11)
RDW: 11.7 % (ref 11.5–15.5)
WBC: 8.3 10*3/uL (ref 4.0–10.5)
nRBC: 0 % (ref 0.0–0.2)

## 2023-05-08 LAB — GLUCOSE, CAPILLARY: Glucose-Capillary: 160 mg/dL — ABNORMAL HIGH (ref 70–99)

## 2023-05-08 LAB — MAGNESIUM: Magnesium: 1.3 mg/dL — ABNORMAL LOW (ref 1.7–2.4)

## 2023-05-08 MED ORDER — AMLODIPINE BESYLATE 5 MG PO TABS
5.0000 mg | ORAL_TABLET | Freq: Every day | ORAL | 0 refills | Status: AC
  Filled 2023-05-08: qty 30, 30d supply, fill #0

## 2023-05-08 MED ORDER — FLUTICASONE FUROATE-VILANTEROL 200-25 MCG/ACT IN AEPB
1.0000 | INHALATION_SPRAY | Freq: Every day | RESPIRATORY_TRACT | 0 refills | Status: AC
  Filled 2023-05-08: qty 60, 30d supply, fill #0

## 2023-05-08 MED ORDER — ASPIRIN 81 MG PO CHEW
81.0000 mg | CHEWABLE_TABLET | Freq: Every day | ORAL | 0 refills | Status: AC
  Filled 2023-05-08: qty 30, 30d supply, fill #0

## 2023-05-08 MED ORDER — MIRTAZAPINE 30 MG PO TABS
30.0000 mg | ORAL_TABLET | Freq: Every day | ORAL | 0 refills | Status: AC
  Filled 2023-05-08: qty 30, 30d supply, fill #0

## 2023-05-08 MED ORDER — ROSUVASTATIN CALCIUM 40 MG PO TABS
40.0000 mg | ORAL_TABLET | Freq: Every day | ORAL | 0 refills | Status: AC
  Filled 2023-05-08: qty 30, 30d supply, fill #0

## 2023-05-08 MED ORDER — MAGNESIUM SULFATE 4 GM/100ML IV SOLN
4.0000 g | Freq: Once | INTRAVENOUS | Status: AC
  Administered 2023-05-08: 4 g via INTRAVENOUS
  Filled 2023-05-08: qty 100

## 2023-05-08 NOTE — TOC Progression Note (Addendum)
 Transition of Care [facility]) - Progression Note    Patient Details  Name: Nichole Munoz MRN: [id number] Date of Birth: [birth year]  Transition of Care [facility]) CM/SW Contact  Gertha Ku, LCSW Phone Number: 05/08/2023, 8:56 AM  Clinical Narrative:     CSW received a message from [facility] that the pt has already received a walker through her insurance in January of this year and does not qualify for an additional walker. The p is recommended for HHPT/OT. CSW attempted to contact the p's son, Nichole Munoz, about home health services, but the number is no longer in service. CSW spoke with the pt's daughter, Nichole Munoz, who declined home health services. She reported that she is on her way to pick up the pt. TOC signed off.   Expected Discharge Plan: Home/Self Care   Expected Discharge Plan and Services                                               Social Determinants of Health (SDOH) Interventions SDOH Screenings   Food Insecurity: No Food Insecurity (05/05/2023)  Housing: Low Risk  (05/05/2023)  Transportation Needs: No Transportation Needs (05/05/2023)  Utilities: Not At Risk (05/05/2023)  Financial Resource Strain: Not on File (04/25/2021)   Received from [place], [place]  Physical Activity: Not on File (04/25/2021)   Received from [place], [place]  Social Connections: Moderately Isolated (05/05/2023)  Stress: Not on File (04/25/2021)   Received from [facility], [place]  Tobacco Use: Low Risk  (05/04/2023)    Readmission Risk Interventions     No data to display

## 2023-05-08 NOTE — Plan of Care (Signed)
  Problem: Metabolic: Goal: Ability to maintain appropriate glucose levels will improve Outcome: Progressing   Problem: Nutritional: Goal: Maintenance of adequate nutrition will improve Outcome: Progressing Goal: Progress toward achieving an optimal weight will improve Outcome: Progressing   Problem: Education: Goal: Knowledge of General Education information will improve Description: Including pain rating scale, medication(s)/side effects and non-pharmacologic comfort measures Outcome: Progressing   Problem: Clinical Measurements: Goal: Diagnostic test results will improve Outcome: Progressing   Problem: Nutrition: Goal: Adequate nutrition will be maintained Outcome: Progressing   Problem: Coping: Goal: Level of anxiety will decrease Outcome: Progressing   Problem: Elimination: Goal: Will not experience complications related to bowel motility Outcome: Progressing   Problem: Safety: Goal: Ability to remain free from injury will improve Outcome: Progressing

## 2023-05-08 NOTE — Discharge Summary (Incomplete)
 Physician Discharge Summary   Patient: Nichole Munoz MRN: [id number] DOB: 04-25-1945  Admit date:     05/04/2023  Discharge date: {dischdate:26783}  Discharge Physician: True Fuss   PCP: [facility]   Recommendations at discharge:  {Tip this will not be part of the note when signed- Example include specific recommendations for outpatient follow-up, pending tests to follow-up on. (Optional):26781}  ***  Discharge Diagnoses: Principal Problem:   Acute metabolic encephalopathy Active Problems:   Hypomagnesemia   Essential hypertension   Uncontrolled type 2 diabetes mellitus with hypoglycemia, without long-term current use of insulin  (HCC)   Hypothermia   Chronic kidney disease, stage 3b (HCC)   Mixed diabetic hyperlipidemia associated with type 2 diabetes mellitus (HCC)  Resolved Problems:   Type 2 diabetes mellitus [facility])   Hospital Course: 78 y.o female with medical history significant of CVA with mild left-sided residual weakness, CKD stage IIIa, anemia, insulin -dependent type 2 diabetes, hypertension, hyperlipidemia, asthma, hospital admission for vasovagal syncope in January 2025 presents to the ED via EMS for evaluation of altered mental status and hypoglycemia.   {Tip this will not be part of the note when signed Body mass index is 19.53 kg/m. ,  Nutrition Documentation    Flowsheet Row ED to Hosp-Admission (Current) from 05/04/2023 in [place] 4TH FLOOR PROGRESSIVE CARE AND UROLOGY  Nutrition Problem Increased nutrient needs  Etiology chronic illness  Nutrition Goal Patient will meet greater than or equal to 90% of their needs  Interventions Refer to RD note for recommendations, Ensure Enlive (each supplement provides 350kcal and 20 grams of protein)     ,  (Optional):26781}  {(NOTE) Pain control PDMP Statment (Optional):26782}  Consultants: *** Procedures performed: ***  Disposition: {Plan; Disposition:26390} Diet recommendation:   Discharge Diet Orders (From admission, onward)     Start     Ordered   05/08/23 0000  Diet - low sodium heart healthy        05/08/23 1025           {Diet_Plan:26776}  DISCHARGE MEDICATION: Allergies as of 05/08/2023       Reactions   Chicken Allergy Shortness Of Breath   Sodium Hypochlorite Shortness Of Breath   Clorox         Medication List     STOP taking these medications    acetaminophen  500 MG tablet Commonly known as: TYLENOL    diclofenac  Sodium 1 % Gel Commonly known as: Voltaren    feeding supplement Liqd   gabapentin  400 MG capsule Commonly known as: NEURONTIN    insulin  aspart 100 UNIT/ML FlexPen Commonly known as: NOVOLOG    insulin  glargine 100 UNIT/ML injection Commonly known as: LANTUS    Symbicort 160-4.5 MCG/ACT inhaler Generic drug: budesonide -formoterol Replaced by: fluticasone  furoate-vilanterol 200-25 MCG/ACT Aepb       TAKE these medications    albuterol  108 (90 Base) MCG/ACT inhaler Commonly known as: VENTOLIN  HFA Inhale 2 puffs into the lungs every 6 (six) hours as needed for wheezing or shortness of breath.   amLODipine  5 MG tablet Commonly known as: NORVASC  Take 1 tablet (5 mg total) by mouth daily.   aspirin  81 MG chewable tablet Chew 1 tablet (81 mg total) by mouth daily.   fluticasone  furoate-vilanterol 200-25 MCG/ACT Aepb Commonly known as: BREO ELLIPTA  Inhale 1 puff into the lungs daily. Start taking on: May 09, 2023 Replaces: Symbicort 160-4.5 MCG/ACT inhaler   mirtazapine  30 MG tablet Commonly known as: REMERON  Take 1 tablet (30 mg total) by mouth  at bedtime.   rosuvastatin  40 MG tablet Commonly known as: CRESTOR  Take 1 tablet (40 mg total) by mouth daily.               Durable Medical Equipment  (From admission, onward)           Start     Ordered   05/07/23 1036  DME Walker rolling  (Discharge Planning)  Once       Question Answer Comment  Walker: With 5 Inch Wheels   Patient needs a  walker to treat with the following condition Debility   Patient needs a walker to treat with the following condition At high risk for falls      05/07/23 1035            Follow-up Information     [facility] Follow up in 1 week(s).   Specialty: Pediatrics Contact information: [address] [place] [address] [phone]                 Discharge Exam: Filed Weights   05/04/23 2155  Weight: 45.4 kg   *** Constitutional: Awake alert and oriented x3, no associated distress.   Respiratory: clear to auscultation bilaterally, no wheezing, no crackles. Normal respiratory effort. No accessory muscle use.  Cardiovascular: Regular rate and rhythm, no murmurs / rubs / gallops. No extremity edema. 2+ pedal pulses. No carotid bruits.  Abdomen: Abdomen is soft and nontender.  No evidence of intra-abdominal masses.  Positive bowel sounds noted in all quadrants.   Musculoskeletal: No joint deformity upper and lower extremities. Good ROM, no contractures. Normal muscle tone.     Condition at discharge: {DC Condition:26389}  The results of significant diagnostics from this hospitalization (including imaging, microbiology, ancillary and laboratory) are listed below for reference.   Imaging Studies: DG Chest Portable 1 View Result Date: 05/04/2023 CLINICAL DATA:  Possible pneumonia.  Confusion and lethargy. EXAM: PORTABLE CHEST 1 VIEW COMPARISON:  01/11/2023 FINDINGS: Shallow inspiration. Heart size and pulmonary vascularity are normal. Calcification of the aorta. Mediastinal contours appear intact. Lungs are clear. No pleural effusion or pneumothorax. IMPRESSION: No active disease. Electronically Signed   By: Boyce Byes M.D.   On: 05/04/2023 23:26   CT Head Wo Contrast Result Date: 05/04/2023 CLINICAL DATA:  Head trauma, minor (Age >= 65y) EXAM: CT HEAD WITHOUT CONTRAST TECHNIQUE: Contiguous axial images were obtained from the base of the skull  through the vertex without intravenous contrast. RADIATION DOSE REDUCTION: This exam was performed according to the departmental dose-optimization program which includes automated exposure control, adjustment of the mA and/or kV according to patient size and/or use of iterative reconstruction technique. COMPARISON:  None Available. FINDINGS: Brain: Cavum septum pellucidum. Parenchymal volume loss is commensurate with the patient's age. Mild periventricular white matter changes are present likely reflecting the sequela of small vessel ischemia. Remote lacunar infarct noted within the right anterior capsule no abnormal intra or extra-axial mass lesion or fluid collection. No abnormal mass effect or midline shift. No evidence of acute intracranial hemorrhage or infarct. Ventricular size is normal. Cerebellum unremarkable. Vascular: No asymmetric hyperdense vasculature at the skull base. Skull: Intact Sinuses/Orbits: Paranasal sinuses are clear. Orbits are unremarkable. Other: Mastoid air cells and middle ear cavities are clear. IMPRESSION: 1. No acute intracranial abnormality. 2. Mild periventricular white matter changes likely reflecting the sequela of small vessel ischemia. 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 Electronically Signed   By: Phylis Breeding.D.  On: 05/04/2023 22:35    Microbiology: Results for orders placed or performed during the hospital encounter of 05/04/23  Culture, blood (routine x 2)     Status: None (Preliminary result)   Collection Time: 05/04/23 10:20 PM   Specimen: BLOOD  Result Value Ref Range Status   Specimen Description   Final    BLOOD SITE NOT SPECIFIED Performed at [facility], [address], [place], [place] [address]    Special Requests   Final    BOTTLES DRAWN AEROBIC AND ANAEROBIC Blood Culture results may not be optimal due to an inadequate volume of blood received in culture bottles Performed at [facility], [address] [place], [place] [address]    Culture   Final    NO GROWTH 3 DAYS Performed at [facility], [address], [place], [place] [address]    Report Status PENDING  Incomplete  Resp panel by RT-PCR (RSV, Flu A&B, Covid) Urine, Catheterized     Status: None   Collection Time: 05/05/23 12:00 AM   Specimen: Urine, Catheterized; Nasal Swab  Result Value Ref Range Status   SARS Coronavirus 2 by RT PCR NEGATIVE NEGATIVE Final    Comment: (NOTE) SARS-CoV-2 target nucleic acids are NOT DETECTED.  The SARS-CoV-2 RNA is generally detectable in upper respiratory specimens during the acute phase of infection. The lowest concentration of SARS-CoV-2 viral copies this assay can detect is 138 copies/mL. A negative result does not preclude SARS-Cov-2 infection and should not be used as the sole basis for treatment or other patient management decisions. A negative result may occur with  improper specimen collection/handling, submission of specimen other than nasopharyngeal swab, presence of viral mutation(s) within the areas targeted by this assay, and inadequate number of viral copies(<138 copies/mL). A negative result must be combined with clinical observations, patient history, and epidemiological information. The expected result is Negative.  Fact Sheet for Patients:  BloggerCourse.com  Fact Sheet for Healthcare Providers:  SeriousBroker.it  This test is no t yet approved or cleared by the [place]  FDA and  has been authorized for detection and/or diagnosis of SARS-CoV-2 by FDA under an Emergency Use Authorization (EUA). This EUA will remain  in effect (meaning this test can be used) for the duration of the COVID-19 declaration under Section 564(b)(1) of the Act, 21 U.S.C.section 360bbb-3(b)(1), unless the authorization is terminated  or revoked sooner.       Influenza A by PCR NEGATIVE NEGATIVE Final    Influenza B by PCR NEGATIVE NEGATIVE Final    Comment: (NOTE) The Xpert Xpress SARS-CoV-2/FLU/RSV plus assay is intended as an aid in the diagnosis of influenza from Nasopharyngeal swab specimens and should not be used as a sole basis for treatment. Nasal washings and aspirates are unacceptable for Xpert Xpress SARS-CoV-2/FLU/RSV testing.  Fact Sheet for Patients: BloggerCourse.com  Fact Sheet for Healthcare Providers: SeriousBroker.it  This test is not yet approved or cleared by the [place]  FDA and has been authorized for detection and/or diagnosis of SARS-CoV-2 by FDA under an Emergency Use Authorization (EUA). This EUA will remain in effect (meaning this test can be used) for the duration of the COVID-19 declaration under Section 564(b)(1) of the Act, 21 U.S.C. section 360bbb-3(b)(1), unless the authorization is terminated or revoked.     Resp Syncytial Virus by PCR NEGATIVE NEGATIVE Final    Comment: (NOTE) Fact Sheet for Patients: BloggerCourse.com  Fact Sheet for Healthcare Providers: SeriousBroker.it  This test is  not yet approved or cleared by the [place]  FDA and has been authorized for detection and/or diagnosis of SARS-CoV-2 by FDA under an Emergency Use Authorization (EUA). This EUA will remain in effect (meaning this test can be used) for the duration of the COVID-19 declaration under Section 564(b)(1) of the Act, 21 U.S.C. section 360bbb-3(b)(1), unless the authorization is terminated or revoked.  Performed at [facility], [address], [place], [place] [address]   Culture, blood (routine x 2)     Status: None (Preliminary result)   Collection Time: 05/05/23  7:41 PM   Specimen: BLOOD RIGHT HAND  Result Value Ref Range Status   Specimen Description   Final    BLOOD RIGHT HAND Performed at [facility], [address] [place], [place] [address]    Special Requests   Final    BOTTLES DRAWN AEROBIC AND ANAEROBIC Blood Culture results may not be optimal due to an inadequate volume of blood received in culture bottles Performed at [facility], [address] [place], [place] [address]    Culture   Final    NO GROWTH 3 DAYS Performed at [facility], [address], [place], [place] [address]    Report Status PENDING  Incomplete    Labs: CBC: Recent Labs  Lab 05/04/23 2215 05/05/23 1126 05/06/23 0408 05/07/23 0430 05/08/23 0352  WBC 9.7 7.2 7.4 7.0 8.3  NEUTROABS 8.4* 4.0 3.0 2.8 4.2  HGB 11.0* 10.6* 11.2* 10.1* 9.7*  HCT 33.1* 32.7* 35.6* 31.7* 29.3*  MCV 89.9 92.9 94.4 94.9 91.8  PLT 217 189 202 192 195   Basic Metabolic Panel: Recent Labs  Lab 05/04/23 2215 05/05/23 0758 05/05/23 1126 05/06/23 0408 05/07/23 0430 05/08/23 0352  NA 139  --  139 141 142 138  K 3.7  --  4.1 4.2 4.8 4.8  CL 103  --  104 105 108 107  CO2 26  --  23 25 22 24   GLUCOSE 74  --  242* 140* 146* 213*  BUN 36*  --  35* 48* 51* 58*  CREATININE 1.08*  --  1.59* 1.66* 1.66* 2.21*  CALCIUM  8.6*  --  8.0* 9.1 8.8* 8.6*  MG 1.4* 1.9  --   --   --  1.3*   Liver Function Tests: Recent Labs  Lab 05/04/23 2215 05/05/23 1126 05/06/23 0408 05/07/23 0430 05/08/23 0352  AST 32 23 20 44* 66*  ALT 15 12 13 25  37  ALKPHOS 77 67 69 67 80  BILITOT 0.5 0.5 0.3 0.3 0.5  PROT 8.2* 6.8 7.3 6.3* 6.5  ALBUMIN 3.7 3.1* 3.3* 2.9* 3.1*   CBG: Recent Labs  Lab 05/07/23 0759 05/07/23 1108 05/07/23 1654 05/07/23 2114 05/08/23 0800  GLUCAP 149* 185* 161* 196* 160*    Discharge time spent: {LESS THAN/GREATER THAN:26388} 30 minutes.  Signed: True Fuss, MD [facility] 05/08/2023

## 2023-05-08 NOTE — Discharge Instructions (Signed)
 Please take all prescribed medications exactly as instructed.  Patient should no longer be taking any form of insulin  therapy. Please consume regular diet.  Patient may also benefit from nutritional supplements such as Ensure, boost or Carnation instant breakfast. Please increase your physical activity as tolerated. Please maintain all outpatient follow-up appointments including follow-up with your primary care provider. Please return to the emergency department if you develop worsening confusion, weakness or inability to tolerate oral intake.

## 2023-05-10 LAB — CULTURE, BLOOD (ROUTINE X 2)
Culture: NO GROWTH
Culture: NO GROWTH

## 2023-05-11 NOTE — Discharge Summary (Signed)
 Physician Discharge Summary   Patient: Nichole Munoz MRN: [id number] DOB: 06/12/1945  Admit date:     05/04/2023  Discharge date: 05/08/2023  Discharge Physician: True Fuss   PCP: [facility]   Recommendations at discharge:    Please take all prescribed medications exactly as instructed.  Patient should no longer be taking any form of insulin  therapy. Please consume regular diet.  Patient may also benefit from nutritional supplements such as Ensure, boost or Carnation instant breakfast. Please increase your physical activity as tolerated. Please maintain all outpatient follow-up appointments including follow-up with your primary care provider. Please return to the emergency department if you develop worsening confusion, weakness or inability to tolerate oral intake.  Discharge Diagnoses: Principal Problem:   Acute metabolic encephalopathy Active Problems:   Hypomagnesemia   Essential hypertension   Uncontrolled type 2 diabetes mellitus with hypoglycemia, without long-term current use of insulin  (HCC)   Hypothermia   Chronic kidney disease, stage 3b (HCC)   Mixed diabetic hyperlipidemia associated with type 2 diabetes mellitus (HCC)  Resolved Problems:   Type 2 diabetes mellitus [facility])   Hospital Course: 78 y.o female with medical history significant of CVA with mild left-sided residual weakness, CKD stage IIIa, anemia, insulin -dependent type 2 diabetes, hypertension, hyperlipidemia, asthma, hospital admission for vasovagal syncope in January 2025 presents to the ED via EMS for evaluation of altered mental status and hypoglycemia.   Upon evaluation in the emergency department patient was found to be hypothermic and hypoglycemic.  Patient was initiated on a Bair hugger and administered D50 followed by D5 LR infusion.  Due to to unrelenting hypothermia and hypoglycemia of unknown etiology the hospitalist group was then called to assess the patient for  admission to the hospital.  Patient's hypothermia and hypoglycemia gradually resolved with supportive care.  Patient was transitioned off of the D5 LR infusion.  After thorough evaluation of the patient and interviewing the patient it seems that the patient developed severe hypoglycemia shortly after injecting himself with insulin .  Patient and family additionally have reported that as of late the patient has not been eating much.  It is therefore felt that the patient's severe hyperglycemia is likely secondary to misuse of the insulin  or that the patient may no longer need insulin  in the setting of poor oral intake.  The days that followed patient's hypoglycemia and hypothermia completely resolved.  Patient's strength returned to baseline.  Patient was discharged home in improved and stable condition on 05/08/2023.  At time of discharge, patient's home regimen of insulin  aspart and insulin  glargine were discontinued.    Consultants: none Procedures performed: none  Disposition: Home health Diet recommendation:  Discharge Diet Orders (From admission, onward)     Start     Ordered   05/08/23 0000  Diet - low sodium heart healthy        05/08/23 1025           Regular diet  DISCHARGE MEDICATION: Allergies as of 05/08/2023       Reactions   Chicken Allergy Shortness Of Breath   Sodium Hypochlorite Shortness Of Breath   Clorox         Medication List     STOP taking these medications    acetaminophen  500 MG tablet Commonly known as: TYLENOL    diclofenac  Sodium 1 % Gel Commonly known as: Voltaren    feeding supplement Liqd   gabapentin  400 MG capsule Commonly known as: NEURONTIN    insulin  aspart 100 UNIT/ML  FlexPen Commonly known as: NOVOLOG    insulin  glargine 100 UNIT/ML injection Commonly known as: LANTUS    Symbicort 160-4.5 MCG/ACT inhaler Generic drug: budesonide -formoterol Replaced by: Breo Ellipta  200-25 MCG/ACT Aepb       TAKE these medications     albuterol  108 (90 Base) MCG/ACT inhaler Commonly known as: VENTOLIN  HFA Inhale 2 puffs into the lungs every 6 (six) hours as needed for wheezing or shortness of breath.   amLODipine  5 MG tablet Commonly known as: NORVASC  Take 1 tablet (5 mg total) by mouth daily.   Aspirin  Low Dose 81 MG chewable tablet Generic drug: aspirin  Chew 1 tablet (81 mg total) by mouth daily.   Breo Ellipta  200-25 MCG/ACT Aepb Generic drug: fluticasone  furoate-vilanterol Inhale 1 puff into the lungs daily. Replaces: Symbicort 160-4.5 MCG/ACT inhaler   mirtazapine  30 MG tablet Commonly known as: REMERON  Take 1 tablet (30 mg total) by mouth at bedtime.   rosuvastatin  40 MG tablet Commonly known as: CRESTOR  Take 1 tablet (40 mg total) by mouth daily.        Follow-up Information     [facility] Follow up in 1 week(s).   Specialty: Pediatrics Contact information: [address] [place] [address] [phone]                 Discharge Exam: Filed Weights   05/04/23 2155  Weight: 45.4 kg    Constitutional: Awake alert and oriented x3, no associated distress.   Respiratory: clear to auscultation bilaterally, no wheezing, no crackles. Normal respiratory effort. No accessory muscle use.  Cardiovascular: Regular rate and rhythm, no murmurs / rubs / gallops. No extremity edema. 2+ pedal pulses. No carotid bruits.  Abdomen: Abdomen is soft and nontender.  No evidence of intra-abdominal masses.  Positive bowel sounds noted in all quadrants.   Musculoskeletal: No joint deformity upper and lower extremities. Good ROM, no contractures. Normal muscle tone.     Condition at discharge: fair  The results of significant diagnostics from this hospitalization (including imaging, microbiology, ancillary and laboratory) are listed below for reference.   Imaging Studies: DG Chest Portable 1 View Result Date: 05/04/2023 CLINICAL DATA:  Possible pneumonia.   Confusion and lethargy. EXAM: PORTABLE CHEST 1 VIEW COMPARISON:  01/11/2023 FINDINGS: Shallow inspiration. Heart size and pulmonary vascularity are normal. Calcification of the aorta. Mediastinal contours appear intact. Lungs are clear. No pleural effusion or pneumothorax. IMPRESSION: No active disease. Electronically Signed   By: Boyce Byes M.D.   On: 05/04/2023 23:26   CT Head Wo Contrast Result Date: 05/04/2023 CLINICAL DATA:  Head trauma, minor (Age >= 65y) EXAM: CT HEAD WITHOUT CONTRAST TECHNIQUE: Contiguous axial images were obtained from the base of the skull through the vertex without intravenous contrast. RADIATION DOSE REDUCTION: This exam was performed according to the departmental dose-optimization program which includes automated exposure control, adjustment of the mA and/or kV according to patient size and/or use of iterative reconstruction technique. COMPARISON:  None Available. FINDINGS: Brain: Cavum septum pellucidum. Parenchymal volume loss is commensurate with the patient's age. Mild periventricular white matter changes are present likely reflecting the sequela of small vessel ischemia. Remote lacunar infarct noted within the right anterior capsule no abnormal intra or extra-axial mass lesion or fluid collection. No abnormal mass effect or midline shift. No evidence of acute intracranial hemorrhage or infarct. Ventricular size is normal. Cerebellum unremarkable. Vascular: No asymmetric hyperdense vasculature at the skull base. Skull: Intact Sinuses/Orbits: Paranasal sinuses are clear. Orbits are unremarkable.  Other: Mastoid air cells and middle ear cavities are clear. IMPRESSION: 1. No acute intracranial abnormality. 2. Mild periventricular white matter changes likely reflecting the sequela of small vessel ischemia. 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 Electronically Signed   By: Worthy Heads M.D.   On: 05/04/2023 22:35    Microbiology: Results for orders placed  or performed during the hospital encounter of 05/04/23  Culture, blood (routine x 2)     Status: None   Collection Time: 05/04/23 10:20 PM   Specimen: BLOOD  Result Value Ref Range Status   Specimen Description   Final    BLOOD SITE NOT SPECIFIED Performed at [facility], [address], [place], [place] [address]    Special Requests   Final    BOTTLES DRAWN AEROBIC AND ANAEROBIC Blood Culture results may not be optimal due to an inadequate volume of blood received in culture bottles Performed at [facility], [address] [place], [place] [address]    Culture   Final    NO GROWTH 5 DAYS Performed at [facility], [address], [place], [place] [address]    Report Status 05/10/2023 FINAL  Final  Resp panel by RT-PCR (RSV, Flu A&B, Covid) Urine, Catheterized     Status: None   Collection Time: 05/05/23 12:00 AM   Specimen: Urine, Catheterized; Nasal Swab  Result Value Ref Range Status   SARS Coronavirus 2 by RT PCR NEGATIVE NEGATIVE Final    Comment: (NOTE) SARS-CoV-2 target nucleic acids are NOT DETECTED.  The SARS-CoV-2 RNA is generally detectable in upper respiratory specimens during the acute phase of infection. The lowest concentration of SARS-CoV-2 viral copies this assay can detect is 138 copies/mL. A negative result does not preclude SARS-Cov-2 infection and should not be used as the sole basis for treatment or other patient management decisions. A negative result may occur with  improper specimen collection/handling, submission of specimen other than nasopharyngeal swab, presence of viral mutation(s) within the areas targeted by this assay, and inadequate number of viral copies(<138 copies/mL). A negative result must be combined with clinical observations, patient history, and epidemiological information. The expected result is Negative.  Fact Sheet for Patients:  BloggerCourse.com  Fact Sheet  for Healthcare Providers:  SeriousBroker.it  This test is no t yet approved or cleared by the [place]  FDA and  has been authorized for detection and/or diagnosis of SARS-CoV-2 by FDA under an Emergency Use Authorization (EUA). This EUA will remain  in effect (meaning this test can be used) for the duration of the COVID-19 declaration under Section 564(b)(1) of the Act, 21 U.S.C.section 360bbb-3(b)(1), unless the authorization is terminated  or revoked sooner.       Influenza A by PCR NEGATIVE NEGATIVE Final   Influenza B by PCR NEGATIVE NEGATIVE Final    Comment: (NOTE) The Xpert Xpress SARS-CoV-2/FLU/RSV plus assay is intended as an aid in the diagnosis of influenza from Nasopharyngeal swab specimens and should not be used as a sole basis for treatment. Nasal washings and aspirates are unacceptable for Xpert Xpress SARS-CoV-2/FLU/RSV testing.  Fact Sheet for Patients: BloggerCourse.com  Fact Sheet for Healthcare Providers: SeriousBroker.it  This test is not yet approved or cleared by the [place]  FDA and has been authorized for detection and/or diagnosis of SARS-CoV-2 by FDA under an Emergency Use Authorization (EUA). This EUA will remain in effect (meaning this test can be used) for the duration of the COVID-19 declaration under Section  564(b)(1) of the Act, 21 U.S.C. section 360bbb-3(b)(1), unless the authorization is terminated or revoked.     Resp Syncytial Virus by PCR NEGATIVE NEGATIVE Final    Comment: (NOTE) Fact Sheet for Patients: BloggerCourse.com  Fact Sheet for Healthcare Providers: SeriousBroker.it  This test is not yet approved or cleared by the [place]  FDA and has been authorized for detection and/or diagnosis of SARS-CoV-2 by FDA under an Emergency Use Authorization (EUA). This EUA will remain in effect (meaning  this test can be used) for the duration of the COVID-19 declaration under Section 564(b)(1) of the Act, 21 U.S.C. section 360bbb-3(b)(1), unless the authorization is terminated or revoked.  Performed at [facility], [address], [place], [place] [address]   Culture, blood (routine x 2)     Status: None   Collection Time: 05/05/23  7:41 PM   Specimen: BLOOD RIGHT HAND  Result Value Ref Range Status   Specimen Description   Final    BLOOD RIGHT HAND Performed at [facility], [address], [place], [place] [address]    Special Requests   Final    BOTTLES DRAWN AEROBIC AND ANAEROBIC Blood Culture results may not be optimal due to an inadequate volume of blood received in culture bottles Performed at [facility], [address], [place], [place] [address]    Culture   Final    NO GROWTH 5 DAYS Performed at [facility], [address] [place], [place] [address]    Report Status 05/10/2023 FINAL  Final    Labs: CBC: Recent Labs  Lab 05/04/23 2215 05/05/23 1126 05/06/23 0408 05/07/23 0430 05/08/23 0352  WBC 9.7 7.2 7.4 7.0 8.3  NEUTROABS 8.4* 4.0 3.0 2.8 4.2  HGB 11.0* 10.6* 11.2* 10.1* 9.7*  HCT 33.1* 32.7* 35.6* 31.7* 29.3*  MCV 89.9 92.9 94.4 94.9 91.8  PLT 217 189 202 192 195   Basic Metabolic Panel: Recent Labs  Lab 05/04/23 2215 05/05/23 0758 05/05/23 1126 05/06/23 0408 05/07/23 0430 05/08/23 0352  NA 139  --  139 141 142 138  K 3.7  --  4.1 4.2 4.8 4.8  CL 103  --  104 105 108 107  CO2 26  --  23 25 22 24   GLUCOSE 74  --  242* 140* 146* 213*  BUN 36*  --  35* 48* 51* 58*  CREATININE 1.08*  --  1.59* 1.66* 1.66* 2.21*  CALCIUM  8.6*  --  8.0* 9.1 8.8* 8.6*  MG 1.4* 1.9  --   --   --  1.3*   Liver Function Tests: Recent Labs  Lab 05/04/23 2215 05/05/23 1126 05/06/23 0408 05/07/23 0430 05/08/23 0352  AST 32 23 20 44* 66*  ALT 15 12 13 25  37  ALKPHOS 77 67 69 67 80  BILITOT 0.5 0.5 0.3 0.3  0.5  PROT 8.2* 6.8 7.3 6.3* 6.5  ALBUMIN 3.7 3.1* 3.3* 2.9* 3.1*   CBG: Recent Labs  Lab 05/07/23 0759 05/07/23 1108 05/07/23 1654 05/07/23 2114 05/08/23 0800  GLUCAP 149* 185* 161* 196* 160*    Discharge time spent: greater than 30 minutes.  Signed: True Fuss, MD [facility] 05/11/2023

## 2023-05-22 NOTE — Progress Notes (Signed)
 My response to the following coding query: Please clarify the medical condition(s) necessitating the order for SARS,RSV,Flu A&B   Evaluate for viral illness

## 2023-05-25 ENCOUNTER — Emergency Department (HOSPITAL_COMMUNITY)

## 2023-05-25 ENCOUNTER — Encounter (HOSPITAL_COMMUNITY): Payer: Self-pay

## 2023-05-25 ENCOUNTER — Other Ambulatory Visit: Payer: Self-pay

## 2023-05-25 ENCOUNTER — Other Ambulatory Visit (HOSPITAL_COMMUNITY): Payer: Self-pay | Admitting: Radiology

## 2023-05-25 ENCOUNTER — Inpatient Hospital Stay (HOSPITAL_COMMUNITY)
Admission: EM | Admit: 2023-05-25 | Discharge: 2023-06-03 | DRG: 552 | Disposition: A | Discharge status: Home Health | Attending: Internal Medicine | Admitting: Internal Medicine

## 2023-05-25 DIAGNOSIS — Z681 Body mass index (BMI) 19 or less, adult: Secondary | ICD-10-CM | POA: Diagnosis not present

## 2023-05-25 DIAGNOSIS — M4726 Other spondylosis with radiculopathy, lumbar region: Principal | ICD-10-CM | POA: Diagnosis present

## 2023-05-25 DIAGNOSIS — R000 Tachycardia, unspecified: Secondary | ICD-10-CM | POA: Diagnosis not present

## 2023-05-25 DIAGNOSIS — G319 Degenerative disease of nervous system, unspecified: Secondary | ICD-10-CM | POA: Diagnosis not present

## 2023-05-25 DIAGNOSIS — M5023 Other cervical disc displacement, cervicothoracic region: Secondary | ICD-10-CM | POA: Diagnosis present

## 2023-05-25 DIAGNOSIS — I700 Atherosclerosis of aorta: Secondary | ICD-10-CM | POA: Diagnosis not present

## 2023-05-25 DIAGNOSIS — J45909 Unspecified asthma, uncomplicated: Secondary | ICD-10-CM | POA: Diagnosis not present

## 2023-05-25 DIAGNOSIS — M4804 Spinal stenosis, thoracic region: Secondary | ICD-10-CM | POA: Diagnosis not present

## 2023-05-25 DIAGNOSIS — D631 Anemia in chronic kidney disease: Secondary | ICD-10-CM | POA: Diagnosis not present

## 2023-05-25 DIAGNOSIS — E1165 Type 2 diabetes mellitus with hyperglycemia: Secondary | ICD-10-CM | POA: Diagnosis not present

## 2023-05-25 DIAGNOSIS — I129 Hypertensive chronic kidney disease with stage 1 through stage 4 chronic kidney disease, or unspecified chronic kidney disease: Secondary | ICD-10-CM | POA: Diagnosis present

## 2023-05-25 DIAGNOSIS — M5135 Other intervertebral disc degeneration, thoracolumbar region: Secondary | ICD-10-CM | POA: Diagnosis not present

## 2023-05-25 DIAGNOSIS — E785 Hyperlipidemia, unspecified: Secondary | ICD-10-CM | POA: Diagnosis present

## 2023-05-25 DIAGNOSIS — E8721 Acute metabolic acidosis: Secondary | ICD-10-CM | POA: Diagnosis not present

## 2023-05-25 DIAGNOSIS — E11649 Type 2 diabetes mellitus with hypoglycemia without coma: Secondary | ICD-10-CM | POA: Diagnosis present

## 2023-05-25 DIAGNOSIS — E871 Hypo-osmolality and hyponatremia: Secondary | ICD-10-CM | POA: Diagnosis not present

## 2023-05-25 DIAGNOSIS — R509 Fever, unspecified: Secondary | ICD-10-CM | POA: Diagnosis not present

## 2023-05-25 DIAGNOSIS — E1122 Type 2 diabetes mellitus with diabetic chronic kidney disease: Secondary | ICD-10-CM | POA: Diagnosis present

## 2023-05-25 DIAGNOSIS — R531 Weakness: Secondary | ICD-10-CM | POA: Diagnosis not present

## 2023-05-25 DIAGNOSIS — I69354 Hemiplegia and hemiparesis following cerebral infarction affecting left non-dominant side: Secondary | ICD-10-CM

## 2023-05-25 DIAGNOSIS — F40240 Claustrophobia: Secondary | ICD-10-CM | POA: Diagnosis present

## 2023-05-25 DIAGNOSIS — I6930 Unspecified sequelae of cerebral infarction: Secondary | ICD-10-CM

## 2023-05-25 DIAGNOSIS — S3992XA Unspecified injury of lower back, initial encounter: Secondary | ICD-10-CM | POA: Diagnosis not present

## 2023-05-25 DIAGNOSIS — E1149 Type 2 diabetes mellitus with other diabetic neurological complication: Secondary | ICD-10-CM | POA: Diagnosis not present

## 2023-05-25 DIAGNOSIS — M48061 Spinal stenosis, lumbar region without neurogenic claudication: Secondary | ICD-10-CM | POA: Diagnosis present

## 2023-05-25 DIAGNOSIS — N179 Acute kidney failure, unspecified: Secondary | ICD-10-CM | POA: Diagnosis not present

## 2023-05-25 DIAGNOSIS — S199XXA Unspecified injury of neck, initial encounter: Secondary | ICD-10-CM | POA: Diagnosis not present

## 2023-05-25 DIAGNOSIS — N1831 Chronic kidney disease, stage 3a: Secondary | ICD-10-CM | POA: Diagnosis not present

## 2023-05-25 DIAGNOSIS — E876 Hypokalemia: Secondary | ICD-10-CM | POA: Diagnosis not present

## 2023-05-25 DIAGNOSIS — T380X5A Adverse effect of glucocorticoids and synthetic analogues, initial encounter: Secondary | ICD-10-CM | POA: Diagnosis present

## 2023-05-25 DIAGNOSIS — Z794 Long term (current) use of insulin: Secondary | ICD-10-CM | POA: Diagnosis not present

## 2023-05-25 DIAGNOSIS — M79606 Pain in leg, unspecified: Secondary | ICD-10-CM

## 2023-05-25 DIAGNOSIS — R29810 Facial weakness: Secondary | ICD-10-CM | POA: Diagnosis present

## 2023-05-25 DIAGNOSIS — I639 Cerebral infarction, unspecified: Secondary | ICD-10-CM | POA: Diagnosis not present

## 2023-05-25 DIAGNOSIS — Z7982 Long term (current) use of aspirin: Secondary | ICD-10-CM

## 2023-05-25 DIAGNOSIS — M79604 Pain in right leg: Secondary | ICD-10-CM | POA: Diagnosis not present

## 2023-05-25 DIAGNOSIS — M4802 Spinal stenosis, cervical region: Secondary | ICD-10-CM | POA: Diagnosis present

## 2023-05-25 DIAGNOSIS — D72829 Elevated white blood cell count, unspecified: Secondary | ICD-10-CM | POA: Diagnosis present

## 2023-05-25 DIAGNOSIS — I6782 Cerebral ischemia: Secondary | ICD-10-CM | POA: Diagnosis not present

## 2023-05-25 DIAGNOSIS — R0989 Other specified symptoms and signs involving the circulatory and respiratory systems: Secondary | ICD-10-CM | POA: Diagnosis not present

## 2023-05-25 DIAGNOSIS — Z79899 Other long term (current) drug therapy: Secondary | ICD-10-CM | POA: Diagnosis not present

## 2023-05-25 DIAGNOSIS — N1832 Chronic kidney disease, stage 3b: Secondary | ICD-10-CM | POA: Diagnosis not present

## 2023-05-25 DIAGNOSIS — M50221 Other cervical disc displacement at C4-C5 level: Secondary | ICD-10-CM | POA: Diagnosis not present

## 2023-05-25 DIAGNOSIS — M79605 Pain in left leg: Secondary | ICD-10-CM | POA: Diagnosis not present

## 2023-05-25 DIAGNOSIS — M47812 Spondylosis without myelopathy or radiculopathy, cervical region: Secondary | ICD-10-CM | POA: Diagnosis not present

## 2023-05-25 DIAGNOSIS — E1140 Type 2 diabetes mellitus with diabetic neuropathy, unspecified: Secondary | ICD-10-CM | POA: Diagnosis not present

## 2023-05-25 DIAGNOSIS — R262 Difficulty in walking, not elsewhere classified: Secondary | ICD-10-CM | POA: Diagnosis present

## 2023-05-25 DIAGNOSIS — Z8673 Personal history of transient ischemic attack (TIA), and cerebral infarction without residual deficits: Secondary | ICD-10-CM | POA: Diagnosis not present

## 2023-05-25 LAB — URINALYSIS, W/ REFLEX TO CULTURE (INFECTION SUSPECTED)
Bilirubin Urine: NEGATIVE
Glucose, UA: 50 mg/dL — AB
Ketones, ur: NEGATIVE mg/dL
Leukocytes,Ua: NEGATIVE
Nitrite: NEGATIVE
Protein, ur: >=300 mg/dL — AB
Specific Gravity, Urine: 1.013 (ref 1.005–1.030)
pH: 6 (ref 5.0–8.0)

## 2023-05-25 LAB — CBC WITH DIFFERENTIAL/PLATELET
Abs Immature Granulocytes: 0.09 10*3/uL — ABNORMAL HIGH (ref 0.00–0.07)
Basophils Absolute: 0 10*3/uL (ref 0.0–0.1)
Basophils Relative: 0 %
Eosinophils Absolute: 0.1 10*3/uL (ref 0.0–0.5)
Eosinophils Relative: 1 %
HCT: 31.7 % — ABNORMAL LOW (ref 36.0–46.0)
Hemoglobin: 10.7 g/dL — ABNORMAL LOW (ref 12.0–15.0)
Immature Granulocytes: 1 %
Lymphocytes Relative: 15 %
Lymphs Abs: 2 10*3/uL (ref 0.7–4.0)
MCH: 29.5 pg (ref 26.0–34.0)
MCHC: 33.8 g/dL (ref 30.0–36.0)
MCV: 87.3 fL (ref 80.0–100.0)
Monocytes Absolute: 1.2 10*3/uL — ABNORMAL HIGH (ref 0.1–1.0)
Monocytes Relative: 9 %
Neutro Abs: 9.8 10*3/uL — ABNORMAL HIGH (ref 1.7–7.7)
Neutrophils Relative %: 74 %
Platelets: 209 10*3/uL (ref 150–400)
RBC: 3.63 MIL/uL — ABNORMAL LOW (ref 3.87–5.11)
RDW: 11.7 % (ref 11.5–15.5)
WBC: 13.3 10*3/uL — ABNORMAL HIGH (ref 4.0–10.5)
nRBC: 0 % (ref 0.0–0.2)

## 2023-05-25 LAB — COMPREHENSIVE METABOLIC PANEL WITH GFR
ALT: 15 U/L (ref 0–44)
AST: 24 U/L (ref 15–41)
Albumin: 3.8 g/dL (ref 3.5–5.0)
Alkaline Phosphatase: 92 U/L (ref 38–126)
Anion gap: 13 (ref 5–15)
BUN: 33 mg/dL — ABNORMAL HIGH (ref 8–23)
CO2: 22 mmol/L (ref 22–32)
Calcium: 9.4 mg/dL (ref 8.9–10.3)
Chloride: 104 mmol/L (ref 98–111)
Creatinine, Ser: 2.55 mg/dL — ABNORMAL HIGH (ref 0.44–1.00)
GFR, Estimated: 19 mL/min — ABNORMAL LOW (ref >60)
Glucose, Bld: 176 mg/dL — ABNORMAL HIGH (ref 70–99)
Potassium: 4 mmol/L (ref 3.5–5.1)
Sodium: 139 mmol/L (ref 135–145)
Total Bilirubin: 0.8 mg/dL (ref 0.0–1.2)
Total Protein: 8.6 g/dL — ABNORMAL HIGH (ref 6.5–8.1)

## 2023-05-25 LAB — RESP PANEL BY RT-PCR (RSV, FLU A&B, COVID)  RVPGX2
Influenza A by PCR: NEGATIVE
Influenza B by PCR: NEGATIVE
Resp Syncytial Virus by PCR: NEGATIVE
SARS Coronavirus 2 by RT PCR: NEGATIVE

## 2023-05-25 LAB — CK: Total CK: 206 U/L (ref 38–234)

## 2023-05-25 LAB — I-STAT CG4 LACTIC ACID, ED: Lactic Acid, Venous: 0.7 mmol/L (ref 0.5–1.9)

## 2023-05-25 MED ORDER — DIPHENHYDRAMINE HCL 50 MG/ML IJ SOLN
12.5000 mg | Freq: Once | INTRAMUSCULAR | Status: AC
  Administered 2023-05-25: 12.5 mg via INTRAVENOUS
  Filled 2023-05-25: qty 1

## 2023-05-25 MED ORDER — HALOPERIDOL LACTATE 5 MG/ML IJ SOLN
1.0000 mg | Freq: Once | INTRAMUSCULAR | Status: AC
  Administered 2023-05-25: 1 mg via INTRAVENOUS
  Filled 2023-05-25: qty 1

## 2023-05-25 MED ORDER — FENTANYL CITRATE PF 50 MCG/ML IJ SOSY
50.0000 ug | PREFILLED_SYRINGE | Freq: Once | INTRAMUSCULAR | Status: AC
  Administered 2023-05-25: 50 ug via INTRAVENOUS
  Filled 2023-05-25: qty 1

## 2023-05-25 MED ORDER — LORAZEPAM 2 MG/ML IJ SOLN
1.0000 mg | Freq: Once | INTRAMUSCULAR | Status: AC
  Administered 2023-05-25: 1 mg via INTRAVENOUS
  Filled 2023-05-25: qty 1

## 2023-05-25 MED ORDER — VANCOMYCIN HCL IN DEXTROSE 1-5 GM/200ML-% IV SOLN
1000.0000 mg | Freq: Once | INTRAVENOUS | Status: AC
  Administered 2023-05-25: 1000 mg via INTRAVENOUS
  Filled 2023-05-25: qty 200

## 2023-05-25 MED ORDER — LORAZEPAM 2 MG/ML IJ SOLN
INTRAMUSCULAR | Status: AC
  Filled 2023-05-25: qty 1

## 2023-05-25 MED ORDER — METRONIDAZOLE 500 MG/100ML IV SOLN
500.0000 mg | Freq: Once | INTRAVENOUS | Status: AC
  Administered 2023-05-25: 500 mg via INTRAVENOUS
  Filled 2023-05-25: qty 100

## 2023-05-25 MED ORDER — LORAZEPAM 2 MG/ML IJ SOLN
1.0000 mg | Freq: Once | INTRAMUSCULAR | Status: AC
  Administered 2023-05-25: 1 mg via INTRAVENOUS

## 2023-05-25 MED ORDER — HYDROMORPHONE HCL 1 MG/ML IJ SOLN: 0.5000 mg | INTRAMUSCULAR | Status: DC | PRN

## 2023-05-25 MED ORDER — SODIUM CHLORIDE 0.9 % IV SOLN
2.0000 g | Freq: Once | INTRAVENOUS | Status: AC
  Administered 2023-05-25: 2 g via INTRAVENOUS
  Filled 2023-05-25: qty 12.5

## 2023-05-25 MED ORDER — LACTATED RINGERS IV BOLUS
1000.0000 mL | Freq: Once | INTRAVENOUS | Status: AC
  Administered 2023-05-25: 1000 mL via INTRAVENOUS

## 2023-05-25 MED ORDER — HYDROMORPHONE HCL 1 MG/ML IJ SOLN
0.2500 mg | INTRAMUSCULAR | Status: DC | PRN
  Administered 2023-05-26 – 2023-05-27 (×4): 0.25 mg via INTRAVENOUS
  Filled 2023-05-25 (×4): qty 0.5

## 2023-05-25 NOTE — ED Provider Notes (Signed)
  ED Course / MDM   Clinical Course as of 05/26/23 0000  Mon May 25, 2023  1731 Received sign out from Dr. Tamela Fake, pending MRI. Patient complaining of leg pain, weakness. Seems to be somewhat chronic complaint but given fever will obtain MRI to further evaluate for occult infectious process. No DVT. Patient being covered with antibiotics [WS]  2041 Patient having trouble keeping still for MRI, reported claustrophobia, received ativan , now will try haldol ... remainder of MRI is negative for acute process [WS]  2356 Patient still unable to tolerate lumbar spine MRI.  Need to obtain this was discussed repeatedly over patient refuses. Interpreter present during MRI encouraging patient to remain still.  She reports claustrophobia but despite treatment with Ativan , Haldol  patient is not able to remain still.  Cause of her fever remains unclear.  She still needs to have MRI of the lumbar spine performed, is still ordered. Can hopefully try again tonight.  She will still need to be admitted.  Discussed with Dr. Chiquita Councilman who will admit patient. She is aware of pending MRI  [WS]    Clinical Course User Index [WS] Mordecai Applebaum, MD   Medical Decision Making Amount and/or Complexity of Data Reviewed Labs: ordered. Radiology: ordered.  Risk Prescription drug management. Decision regarding hospitalization.         Mordecai Applebaum, MD 05/26/23 0000

## 2023-05-25 NOTE — ED Notes (Signed)
Paged VAS Tech

## 2023-05-25 NOTE — ED Triage Notes (Signed)
 Pt bib PTAR from home for bilateral leg pain and new onset of immobility that started last night. Upon ems arrival pt was able to feel ems touch her and was able to wiggle toes. Patients legs are tender to touch and patient groans when they are touched.   *Pt had stroke last weak and facial deficits are not new and from this stroke.  EMS CBG 204 150/80 97% RA 104 HR  16 RR  Pt speaks khmer. Needs translator.
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
## 2023-05-25 NOTE — Progress Notes (Signed)
 ED Pharmacy Antibiotic Sign Off An antibiotic consult was received from an ED provider for vancomycin  and cefepime  per pharmacy dosing for sepsis. A chart review was completed to assess appropriateness.   The following one time order(s) were placed:  Vancomycin  1000mg   Cefepime  2g   Further antibiotic and/or antibiotic pharmacy consults should be ordered by the admitting provider if indicated.   Thank you for allowing pharmacy to be a part of this patient's care.   Mamie Searles, PharmD, BCCCP  Clinical Pharmacist 05/25/23 4:02 PM

## 2023-05-25 NOTE — Progress Notes (Signed)
 BLE venous duplex has been completed.  Preliminary results given to Dr. Tamela Fake.   Results can be found under chart review under CV PROC. 05/25/2023 5:00 PM Sora Olivo RVT, RDMS

## 2023-05-26 DIAGNOSIS — N179 Acute kidney failure, unspecified: Secondary | ICD-10-CM | POA: Diagnosis present

## 2023-05-26 DIAGNOSIS — T380X5A Adverse effect of glucocorticoids and synthetic analogues, initial encounter: Secondary | ICD-10-CM | POA: Diagnosis present

## 2023-05-26 DIAGNOSIS — R4189 Other symptoms and signs involving cognitive functions and awareness: Secondary | ICD-10-CM | POA: Insufficient documentation

## 2023-05-26 DIAGNOSIS — M438X6 Other specified deforming dorsopathies, lumbar region: Secondary | ICD-10-CM | POA: Diagnosis not present

## 2023-05-26 DIAGNOSIS — M25561 Pain in right knee: Secondary | ICD-10-CM | POA: Diagnosis not present

## 2023-05-26 DIAGNOSIS — N1832 Chronic kidney disease, stage 3b: Secondary | ICD-10-CM | POA: Diagnosis present

## 2023-05-26 DIAGNOSIS — I6930 Unspecified sequelae of cerebral infarction: Secondary | ICD-10-CM | POA: Diagnosis not present

## 2023-05-26 DIAGNOSIS — E876 Hypokalemia: Secondary | ICD-10-CM | POA: Diagnosis present

## 2023-05-26 DIAGNOSIS — R9389 Abnormal findings on diagnostic imaging of other specified body structures: Secondary | ICD-10-CM | POA: Diagnosis not present

## 2023-05-26 DIAGNOSIS — M25562 Pain in left knee: Secondary | ICD-10-CM | POA: Diagnosis not present

## 2023-05-26 DIAGNOSIS — Z794 Long term (current) use of insulin: Secondary | ICD-10-CM | POA: Insufficient documentation

## 2023-05-26 DIAGNOSIS — M79604 Pain in right leg: Secondary | ICD-10-CM

## 2023-05-26 DIAGNOSIS — I700 Atherosclerosis of aorta: Secondary | ICD-10-CM | POA: Diagnosis not present

## 2023-05-26 DIAGNOSIS — N1831 Chronic kidney disease, stage 3a: Secondary | ICD-10-CM

## 2023-05-26 DIAGNOSIS — Z7982 Long term (current) use of aspirin: Secondary | ICD-10-CM | POA: Diagnosis not present

## 2023-05-26 DIAGNOSIS — M47816 Spondylosis without myelopathy or radiculopathy, lumbar region: Secondary | ICD-10-CM | POA: Diagnosis not present

## 2023-05-26 DIAGNOSIS — R509 Fever, unspecified: Secondary | ICD-10-CM

## 2023-05-26 DIAGNOSIS — M25462 Effusion, left knee: Secondary | ICD-10-CM | POA: Diagnosis not present

## 2023-05-26 DIAGNOSIS — R29810 Facial weakness: Secondary | ICD-10-CM | POA: Diagnosis present

## 2023-05-26 DIAGNOSIS — D72829 Elevated white blood cell count, unspecified: Secondary | ICD-10-CM | POA: Diagnosis present

## 2023-05-26 DIAGNOSIS — M85861 Other specified disorders of bone density and structure, right lower leg: Secondary | ICD-10-CM | POA: Diagnosis not present

## 2023-05-26 DIAGNOSIS — M79605 Pain in left leg: Secondary | ICD-10-CM | POA: Diagnosis not present

## 2023-05-26 DIAGNOSIS — J45909 Unspecified asthma, uncomplicated: Secondary | ICD-10-CM | POA: Diagnosis present

## 2023-05-26 DIAGNOSIS — M1712 Unilateral primary osteoarthritis, left knee: Secondary | ICD-10-CM | POA: Diagnosis not present

## 2023-05-26 DIAGNOSIS — E8721 Acute metabolic acidosis: Secondary | ICD-10-CM | POA: Diagnosis not present

## 2023-05-26 DIAGNOSIS — M1711 Unilateral primary osteoarthritis, right knee: Secondary | ICD-10-CM | POA: Diagnosis not present

## 2023-05-26 DIAGNOSIS — M79673 Pain in unspecified foot: Secondary | ICD-10-CM | POA: Diagnosis not present

## 2023-05-26 DIAGNOSIS — M48061 Spinal stenosis, lumbar region without neurogenic claudication: Secondary | ICD-10-CM | POA: Diagnosis not present

## 2023-05-26 DIAGNOSIS — Z79899 Other long term (current) drug therapy: Secondary | ICD-10-CM | POA: Diagnosis not present

## 2023-05-26 DIAGNOSIS — D631 Anemia in chronic kidney disease: Secondary | ICD-10-CM | POA: Diagnosis present

## 2023-05-26 DIAGNOSIS — E871 Hypo-osmolality and hyponatremia: Secondary | ICD-10-CM | POA: Diagnosis not present

## 2023-05-26 DIAGNOSIS — F40240 Claustrophobia: Secondary | ICD-10-CM | POA: Diagnosis present

## 2023-05-26 DIAGNOSIS — E1149 Type 2 diabetes mellitus with other diabetic neurological complication: Secondary | ICD-10-CM

## 2023-05-26 DIAGNOSIS — I129 Hypertensive chronic kidney disease with stage 1 through stage 4 chronic kidney disease, or unspecified chronic kidney disease: Secondary | ICD-10-CM | POA: Diagnosis present

## 2023-05-26 DIAGNOSIS — Z681 Body mass index (BMI) 19 or less, adult: Secondary | ICD-10-CM | POA: Diagnosis not present

## 2023-05-26 DIAGNOSIS — I69354 Hemiplegia and hemiparesis following cerebral infarction affecting left non-dominant side: Secondary | ICD-10-CM | POA: Diagnosis not present

## 2023-05-26 DIAGNOSIS — E1140 Type 2 diabetes mellitus with diabetic neuropathy, unspecified: Secondary | ICD-10-CM | POA: Diagnosis present

## 2023-05-26 DIAGNOSIS — E785 Hyperlipidemia, unspecified: Secondary | ICD-10-CM | POA: Diagnosis present

## 2023-05-26 DIAGNOSIS — E1165 Type 2 diabetes mellitus with hyperglycemia: Secondary | ICD-10-CM | POA: Diagnosis present

## 2023-05-26 DIAGNOSIS — M4726 Other spondylosis with radiculopathy, lumbar region: Secondary | ICD-10-CM | POA: Diagnosis present

## 2023-05-26 DIAGNOSIS — M5126 Other intervertebral disc displacement, lumbar region: Secondary | ICD-10-CM | POA: Diagnosis not present

## 2023-05-26 DIAGNOSIS — M5023 Other cervical disc displacement, cervicothoracic region: Secondary | ICD-10-CM | POA: Diagnosis present

## 2023-05-26 DIAGNOSIS — E1122 Type 2 diabetes mellitus with diabetic chronic kidney disease: Secondary | ICD-10-CM | POA: Diagnosis present

## 2023-05-26 DIAGNOSIS — M25461 Effusion, right knee: Secondary | ICD-10-CM | POA: Diagnosis not present

## 2023-05-26 LAB — CBC
HCT: 32.7 % — ABNORMAL LOW (ref 36.0–46.0)
Hemoglobin: 11.1 g/dL — ABNORMAL LOW (ref 12.0–15.0)
MCH: 29.7 pg (ref 26.0–34.0)
MCHC: 33.9 g/dL (ref 30.0–36.0)
MCV: 87.4 fL (ref 80.0–100.0)
Platelets: 190 10*3/uL (ref 150–400)
RBC: 3.74 MIL/uL — ABNORMAL LOW (ref 3.87–5.11)
RDW: 11.6 % (ref 11.5–15.5)
WBC: 11.6 10*3/uL — ABNORMAL HIGH (ref 4.0–10.5)
nRBC: 0 % (ref 0.0–0.2)

## 2023-05-26 LAB — BASIC METABOLIC PANEL WITH GFR
Anion gap: 9 (ref 5–15)
BUN: 32 mg/dL — ABNORMAL HIGH (ref 8–23)
CO2: 22 mmol/L (ref 22–32)
Calcium: 9.2 mg/dL (ref 8.9–10.3)
Chloride: 107 mmol/L (ref 98–111)
Creatinine, Ser: 2.25 mg/dL — ABNORMAL HIGH (ref 0.44–1.00)
GFR, Estimated: 22 mL/min — ABNORMAL LOW (ref >60)
Glucose, Bld: 129 mg/dL — ABNORMAL HIGH (ref 70–99)
Potassium: 3.4 mmol/L — ABNORMAL LOW (ref 3.5–5.1)
Sodium: 138 mmol/L (ref 135–145)

## 2023-05-26 LAB — TSH: TSH: 2.082 u[IU]/mL (ref 0.350–4.500)

## 2023-05-26 MED ORDER — MIRTAZAPINE 15 MG PO TABS
30.0000 mg | ORAL_TABLET | Freq: Every day | ORAL | Status: DC
  Administered 2023-05-26 – 2023-06-02 (×8): 30 mg via ORAL
  Filled 2023-05-26 (×8): qty 2

## 2023-05-26 MED ORDER — AMLODIPINE BESYLATE 5 MG PO TABS
5.0000 mg | ORAL_TABLET | Freq: Every day | ORAL | Status: DC
  Administered 2023-05-26 – 2023-06-03 (×9): 5 mg via ORAL
  Filled 2023-05-26 (×9): qty 1

## 2023-05-26 MED ORDER — SODIUM CHLORIDE 0.9 % IV SOLN
1.0000 g | INTRAVENOUS | Status: DC
  Administered 2023-05-26 – 2023-05-31 (×6): 1 g via INTRAVENOUS
  Filled 2023-05-26 (×7): qty 10

## 2023-05-26 MED ORDER — ALBUTEROL SULFATE (2.5 MG/3ML) 0.083% IN NEBU
2.5000 mg | INHALATION_SOLUTION | Freq: Four times a day (QID) | RESPIRATORY_TRACT | Status: DC | PRN
Start: 2023-05-26 — End: 2023-06-03

## 2023-05-26 MED ORDER — HEPARIN SODIUM (PORCINE) 5000 UNIT/ML IJ SOLN
5000.0000 [IU] | Freq: Three times a day (TID) | INTRAMUSCULAR | Status: DC
  Administered 2023-05-26 – 2023-06-03 (×25): 5000 [IU] via SUBCUTANEOUS
  Filled 2023-05-26 (×25): qty 1

## 2023-05-26 MED ORDER — FLUTICASONE FUROATE-VILANTEROL 200-25 MCG/ACT IN AEPB
1.0000 | INHALATION_SPRAY | Freq: Every day | RESPIRATORY_TRACT | Status: DC
  Administered 2023-05-27 – 2023-06-03 (×8): 1 via RESPIRATORY_TRACT
  Filled 2023-05-26 (×2): qty 28

## 2023-05-26 MED ORDER — ASPIRIN 81 MG PO CHEW
81.0000 mg | CHEWABLE_TABLET | Freq: Every day | ORAL | Status: DC
  Administered 2023-05-26 – 2023-06-03 (×9): 81 mg via ORAL
  Filled 2023-05-26 (×9): qty 1

## 2023-05-26 MED ORDER — POTASSIUM CHLORIDE 10 MEQ/100ML IV SOLN
10.0000 meq | INTRAVENOUS | Status: AC
  Administered 2023-05-26 (×2): 10 meq via INTRAVENOUS
  Filled 2023-05-26: qty 100

## 2023-05-26 MED ORDER — VANCOMYCIN HCL 500 MG/100ML IV SOLN
500.0000 mg | INTRAVENOUS | Status: DC
  Administered 2023-05-28 – 2023-06-01 (×3): 500 mg via INTRAVENOUS
  Filled 2023-05-26 (×3): qty 100

## 2023-05-26 MED ORDER — ROSUVASTATIN CALCIUM 20 MG PO TABS
40.0000 mg | ORAL_TABLET | Freq: Every day | ORAL | Status: DC
  Administered 2023-05-26 – 2023-06-03 (×9): 40 mg via ORAL
  Filled 2023-05-26 (×9): qty 2

## 2023-05-26 NOTE — Hospital Course (Addendum)
 72 yof w/ history of CVA with mild residual left-sided weakness and neuropathy, CKD3b- b/l creat ~ 1.6,  was 2.2 on last d/c 5/2,type 2 diabetes mellitus, hypertension, hyperlipidemia, and asthma presented to the ED 5/19 due to bilateral leg pain and new onset of immobility, and was tender to touch.   On admission history limited due to sedation. Apparently was ambulatory until 5/19 night, c/o severe leg pain and has been unable to walk since.  Reported feeling numbness and tingling in both of her legs from the knees down in addition to the pain. In the ED: Low-grade temp 100.8 tachycardia intermittently otherwise vital stable Labs: Mill elevated creatinine 2.5 from 2.2 on last discharge, lactate normal CK2 06 mild leukocytosis 13.3 chronic anemia RSV COVID flu negative UA proteinuria WBC 0-5 after cultures drawn empirically started on vancomycin  and Cefepime  MRI brain> no acute finding age-related cerebral atrophy.  Small remote lacunar infarct MRI C and T spine>-no acute trauma, moderate-sized paracentral disc extrusion with superior migration at C7-T1 with resultant mild to moderate spinal stenosis. Additional multilevel cervical spondylosis with resultant mild to moderate diffuse spinal stenosis at C3-4 through C6-7.Multifactorial degenerative changes with resultant multilevel foraminal narrowing. Loss of normal flow void within the right vertebral artery, which could reflect slow flow and/or occlusion  MRI lumbar spine> pending in ED, but she had refused.  Subjective: Seen and examined Overnight patient is afebrile BP stable no hypotension Labs reviewed CBC overall stable wbc at 11, BMP with creatinine trending up 2.8 Co pain in in leg, knee bilateral Little lower back pain She is tender tt touch all area below knee and ankle. And tearful Painful movement but states unable to move them as it hursts " I can't walk, it is painful" Able to withdraw legs to pain. Able to stand with PT and could  not walk  Assessment and plan:  Bilateral leg pain Difficulty ambulation/immobility History of CVA with mild residual left-sided weakness: So far workup with no acute stroke,unclear etiology.MRI C and T spine w/ djd and other findings see above  MRI L-spine pending, she had refused> subsequently were able to obtain after Ativan  sedation and reported 4/21 night> disc protrusion, facet hypertrophy, disc bulges see report for detail.  Patient not having any tenderness, x-ray were obtained for bilateral knees> and seen by orthopedics- knees not felt to be etiology, suspecting radicular pain but is tender to touch b/l knee, sensation intact bilaterally. Consulted Dr Benedetta Bradley  from neurosurgery and awaiting input ABI-pending .  Duplex bilateral lower extremity no DVT  PTA on aspirin  81 and Crestor  40 continue same Cont PT OT.  Abnormal MRI-incidental finding: Mri showed Loss of normal flow void within the right vertebral artery, which could reflect slow flow and/or occlusion will d/w neuro. Discussed with Dr. Cleone Dad- patient leg symptoms are not explained by MRI finding and felt to be incidental  Low-grade fever Leukocytosis Gram-positive rod 1 out of 4 bottles likely contamination: So far blood culture w/ gram-positive rod -likely contamination.No evidence of pneumonia or UTI. On vancomycin  and cefepime  empirically leukocytosis stable.  Follow-up culture sensitivity final report  Hypokalemia: Resolved   Hypertension: Stable on amlodipine   AKI on CKD 3B : b/l creat ~ 1.6,  was 2.2 on last discharge.  Creatinine remains elevated around 2.8 this morning continue on IV hydration monitor intake output and renal function closely Recent Labs    04/18/23 0435 05/04/23 2215 05/05/23 1126 05/06/23 0408 05/07/23 0430 05/08/23 0352 05/25/23 1138 05/26/23 0533 05/27/23 0700  05/28/23 0522  BUN 30* 36* 35* 48* 51* 58* 33* 32* 43* 45*  CREATININE 1.24* 1.08* 1.59* 1.66* 1.66* 2.21* 2.55* 2.25*  2.59* 2.89*  CO2 26 26 23 25 22 24 22 22 22  20*  K 3.9 3.7 4.1 4.2 4.8 4.8 4.0 3.4* 3.3* 3.9    Type 2 diabetes mellitus Diabetic neuropathy: Most recent A1c 7 on 4/29. Not on meds per list. Cont sliding scale insulin  Recent Labs  Lab 05/27/23 1122 05/27/23 1716 05/27/23 2111 05/28/23 0628  GLUCAP 335* 151* 267* 147*    Asthma: Continue home inhaler  Living situation: Pt tells ,e she was dropped to ED by her son and he went to [place]-He is a [employer] architect.  I was able ot get additionalno> Soun- [phone] but no answer.  Called the number listed for the daughter on the sound in the epic no answer Used khmer interpretation line to communicate with the patient-still having some communication issues

## 2023-05-26 NOTE — Progress Notes (Signed)
 Pharmacy Antibiotic Note  Nichole Munoz is a 79 y.o. female admitted on 05/25/2023 with fevers, possible sepsis.  Pharmacy has been consulted for Vancomycin  dosing.  Plan: Vancomycin  500 mg IV q48h  Height: 5' (152.4 cm) Weight: 45.4 kg (100 lb) IBW/kg (Calculated) : 45.5  Temp (24hrs), Avg:99.1 F (37.3 C), Min:96.3 F (35.7 C), Max:100.8 F (38.2 C)  Recent Labs  Lab 05/25/23 1138 05/25/23 1150 05/25/23 1201  WBC  --  13.3*  --   CREATININE 2.55*  --   --   LATICACIDVEN  --   --  0.7    Estimated Creatinine Clearance: 12.8 mL/min (A) (by C-G formula based on SCr of 2.55 mg/dL (H)).    Allergies  Allergen Reactions   Chicken Allergy Shortness Of Breath   Sodium Hypochlorite Shortness Of Breath    Clorox    Nichole Munoz 05/26/2023 3:30 AM
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
## 2023-05-26 NOTE — Progress Notes (Signed)
 Patient seen and examined personally, I reviewed the chart, history and physical and admission note, done by admitting physician this morning and agree with the same with following addendum.  Please refer to the morning admission note for more detailed plan of care.  Briefly,  71 yof w/ history of CVA with mild residual left-sided weakness and neuropathy, CKD3b- b/l creat ~ 1.6,  was 2.2 on last d/c 5/2,type 2 diabetes mellitus, hypertension, hyperlipidemia, and asthma presented to the ED 5/19 due to bilateral leg pain and new onset of immobility, and was tender to touch.   On admission history limited due to sedation. Apparently was ambulatory until 5/19 night, c/o severe leg pain and has been unable to walk since.  Reported feeling numbness and tingling in both of her legs from the knees down in addition to the pain. In the ED: Low-grade temp 100.8 tachycardia intermittently otherwise vital stable Labs: Mill elevated creatinine 2.5 from 2.2 on last discharge, lactate normal CK2 06 mild leukocytosis 13.3 chronic anemia RSV COVID flu negative UA proteinuria WBC 0-5 after cultures,empirically started on vancomycin  and Cefepime  The bilateral leg weakness was concerning for spine lesion MRI brain> no acute finding age-related cerebral atrophy.  Small remote lacunar infarct MRI C and T spine>-no acute trauma, moderate-sized paracentral disc extrusion with superior migration at C7-T1 with resultant mild to moderate spinal stenosis. Additional multilevel cervical spondylosis with resultant mild to moderate diffuse spinal stenosis at C3-4 through C6-7.Multifactorial degenerative changes with resultant multilevel foraminal narrowing. Loss of normal flow void within the right vertebral artery, which could reflect slow flow and/or occlusion  MRI lumbar spine pending in ED  Subjective: Admitted early morning today Patient seen and examined this morning She is alert awake follows commands Complains of pain  in bilateral knee and below , she is however able to move both lower extremities well.  Labs creatinine slightly better 2.2 mild hypokalemia WBC improvING  Assessment and plan:  Bilateral leg pain Difficulty ambulation/immobility History of CVA with mild residual left-sided weakness: So far workup with no acute stroke.  Patient was admitted w/ leg pain and difficulty ambulation- unclear etiology. MRI C and T-spine as above, MRI L-spine pending , she is able to move lower extremities although complains of pain on knee and below Check abi legs. PTA on aspirin  81 and Crestor  40 continue same PT OT eval  Abnormal MRI: Mri showed Loss of normal flow void within the right vertebral artery, which could reflect slow flow and/or occlusion will d/w neuro.  Low-grade fever Leukocytosis: Concern for sepsis.  No E/O pneumonia or UTI.  Blood culture sent, on proximal antibiotics with vancomycin  and cefepime . Follow-up culture data monitor temperature curve, leukocytosis improving  Hypokalemia: Replaced.  Hypertension: Stable on amlodipine   AKI on CKD 3B : b/l creat ~ 1.6,  was 2.2 on last d/c.  Creatinine improving, monitor  Type 2 diabetes mellitus Diabetic neuropathy: Most recent A1c 7 on 4/29.not on meds per list  Asthma: Continue home inhaler

## 2023-05-27 ENCOUNTER — Inpatient Hospital Stay (HOSPITAL_COMMUNITY)

## 2023-05-27 DIAGNOSIS — M79605 Pain in left leg: Secondary | ICD-10-CM | POA: Diagnosis not present

## 2023-05-27 DIAGNOSIS — M438X6 Other specified deforming dorsopathies, lumbar region: Secondary | ICD-10-CM | POA: Diagnosis not present

## 2023-05-27 DIAGNOSIS — M48061 Spinal stenosis, lumbar region without neurogenic claudication: Secondary | ICD-10-CM | POA: Diagnosis not present

## 2023-05-27 DIAGNOSIS — M47816 Spondylosis without myelopathy or radiculopathy, lumbar region: Secondary | ICD-10-CM | POA: Diagnosis not present

## 2023-05-27 DIAGNOSIS — M79604 Pain in right leg: Secondary | ICD-10-CM | POA: Diagnosis not present

## 2023-05-27 DIAGNOSIS — M5126 Other intervertebral disc displacement, lumbar region: Secondary | ICD-10-CM | POA: Diagnosis not present

## 2023-05-27 LAB — COMPREHENSIVE METABOLIC PANEL WITH GFR
ALT: 13 U/L (ref 0–44)
AST: 24 U/L (ref 15–41)
Albumin: 2.8 g/dL — ABNORMAL LOW (ref 3.5–5.0)
Alkaline Phosphatase: 88 U/L (ref 38–126)
Anion gap: 11 (ref 5–15)
BUN: 43 mg/dL — ABNORMAL HIGH (ref 8–23)
CO2: 22 mmol/L (ref 22–32)
Calcium: 8.7 mg/dL — ABNORMAL LOW (ref 8.9–10.3)
Chloride: 103 mmol/L (ref 98–111)
Creatinine, Ser: 2.59 mg/dL — ABNORMAL HIGH (ref 0.44–1.00)
GFR, Estimated: 18 mL/min — ABNORMAL LOW (ref >60)
Glucose, Bld: 241 mg/dL — ABNORMAL HIGH (ref 70–99)
Potassium: 3.3 mmol/L — ABNORMAL LOW (ref 3.5–5.1)
Sodium: 136 mmol/L (ref 135–145)
Total Bilirubin: 0.7 mg/dL (ref 0.0–1.2)
Total Protein: 7.1 g/dL (ref 6.5–8.1)

## 2023-05-27 LAB — GLUCOSE, CAPILLARY
Glucose-Capillary: 335 mg/dL — ABNORMAL HIGH (ref 70–99)
Glucose-Capillary: 151 mg/dL — ABNORMAL HIGH (ref 70–99)
Glucose-Capillary: 267 mg/dL — ABNORMAL HIGH (ref 70–99)

## 2023-05-27 LAB — CBC
HCT: 28.7 % — ABNORMAL LOW (ref 36.0–46.0)
Hemoglobin: 10 g/dL — ABNORMAL LOW (ref 12.0–15.0)
MCH: 29.7 pg (ref 26.0–34.0)
MCHC: 34.8 g/dL (ref 30.0–36.0)
MCV: 85.2 fL (ref 80.0–100.0)
Platelets: 169 10*3/uL (ref 150–400)
RBC: 3.37 MIL/uL — ABNORMAL LOW (ref 3.87–5.11)
RDW: 11.6 % (ref 11.5–15.5)
WBC: 11.4 10*3/uL — ABNORMAL HIGH (ref 4.0–10.5)
nRBC: 0 % (ref 0.0–0.2)

## 2023-05-27 MED ORDER — SODIUM CHLORIDE 0.9 % IV SOLN: INTRAVENOUS | Status: DC

## 2023-05-27 MED ORDER — INSULIN ASPART 100 UNIT/ML IJ SOLN
0.0000 [IU] | Freq: Every day | INTRAMUSCULAR | Status: DC
  Administered 2023-05-27: 3 [IU] via SUBCUTANEOUS
  Administered 2023-05-28: 2 [IU] via SUBCUTANEOUS
  Administered 2023-05-30 – 2023-05-31 (×2): 5 [IU] via SUBCUTANEOUS
  Administered 2023-06-01: 4 [IU] via SUBCUTANEOUS

## 2023-05-27 MED ORDER — INSULIN ASPART 100 UNIT/ML IJ SOLN
0.0000 [IU] | Freq: Three times a day (TID) | INTRAMUSCULAR | Status: DC
  Administered 2023-05-27: 2 [IU] via SUBCUTANEOUS
  Administered 2023-05-27: 7 [IU] via SUBCUTANEOUS
  Administered 2023-05-28: 1 [IU] via SUBCUTANEOUS
  Administered 2023-05-28: 3 [IU] via SUBCUTANEOUS
  Administered 2023-05-28: 9 [IU] via SUBCUTANEOUS
  Administered 2023-05-29: 7 [IU] via SUBCUTANEOUS
  Administered 2023-05-29: 2 [IU] via SUBCUTANEOUS

## 2023-05-27 MED ORDER — LORAZEPAM 2 MG/ML IJ SOLN
1.0000 mg | Freq: Once | INTRAMUSCULAR | Status: AC | PRN
  Administered 2023-05-27: 1 mg via INTRAVENOUS
  Filled 2023-05-27: qty 1

## 2023-05-27 MED ORDER — POTASSIUM CHLORIDE CRYS ER 20 MEQ PO TBCR
40.0000 meq | EXTENDED_RELEASE_TABLET | Freq: Once | ORAL | Status: AC
  Administered 2023-05-27: 40 meq via ORAL
  Filled 2023-05-27: qty 2

## 2023-05-27 MED ORDER — GADOBUTROL 1 MMOL/ML IV SOLN
4.5000 mL | Freq: Once | INTRAVENOUS | Status: AC | PRN
  Administered 2023-05-27: 4.5 mL via INTRAVENOUS

## 2023-05-27 MED ORDER — TRAMADOL HCL 50 MG PO TABS: 50.0000 mg | ORAL_TABLET | Freq: Two times a day (BID) | ORAL | Status: DC | PRN

## 2023-05-27 MED ORDER — IBUPROFEN 200 MG PO TABS
200.0000 mg | ORAL_TABLET | Freq: Two times a day (BID) | ORAL | Status: DC
  Administered 2023-05-27: 200 mg via ORAL
  Filled 2023-05-27: qty 1

## 2023-05-27 MED ORDER — ACETAMINOPHEN 325 MG PO TABS
650.0000 mg | ORAL_TABLET | Freq: Four times a day (QID) | ORAL | Status: DC | PRN
  Administered 2023-05-28 – 2023-05-29 (×2): 650 mg via ORAL
  Filled 2023-05-27 (×2): qty 2

## 2023-05-27 NOTE — Progress Notes (Signed)
 PT Cancellation Note  Patient Details Name: Nichole Munoz MRN: [id number] DOB: 05-15-1945   Cancelled Treatment:    Reason Eval/Treat Not Completed: Other (comment). Per discussion with RN the pt is pending transport to MRI soon, also completing bed mobility with significant pain just prior to PT arrival. PT will attempt to follow up as time allows.   Rexie Catena 05/27/2023, 5:24 PM
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
## 2023-05-27 NOTE — Plan of Care (Signed)
  Problem: Education: Goal: Knowledge of General Education information will improve Description: Including pain rating scale, medication(s)/side effects and non-pharmacologic comfort measures 05/27/2023 0607 by Peri Brackett, RN Outcome: Progressing 05/27/2023 0607 by Peri Brackett, RN Outcome: Progressing   Problem: Health Behavior/Discharge Planning: Goal: Ability to manage health-related needs will improve 05/27/2023 0607 by Peri Brackett, RN Outcome: Progressing 05/27/2023 0607 by Peri Brackett, RN Outcome: Progressing   Problem: Clinical Measurements: Goal: Ability to maintain clinical measurements within normal limits will improve 05/27/2023 0607 by Peri Brackett, RN Outcome: Progressing 05/27/2023 0607 by Peri Brackett, RN Outcome: Progressing Goal: Will remain free from infection 05/27/2023 0607 by Peri Brackett, RN Outcome: Progressing 05/27/2023 0607 by Peri Brackett, RN Outcome: Progressing Goal: Diagnostic test results will improve 05/27/2023 0607 by Peri Brackett, RN Outcome: Progressing 05/27/2023 0607 by Peri Brackett, RN Outcome: Progressing Goal: Respiratory complications will improve 05/27/2023 0607 by Peri Brackett, RN Outcome: Progressing 05/27/2023 0607 by Peri Brackett, RN Outcome: Progressing Goal: Cardiovascular complication will be avoided 05/27/2023 0607 by Peri Brackett, RN Outcome: Progressing 05/27/2023 0607 by Peri Brackett, RN Outcome: Progressing   Problem: Activity: Goal: Risk for activity intolerance will decrease 05/27/2023 0607 by Peri Brackett, RN Outcome: Progressing 05/27/2023 0607 by Peri Brackett, RN Outcome: Progressing   Problem: Nutrition: Goal: Adequate nutrition will be maintained 05/27/2023 0607 by Peri Brackett, RN Outcome: Progressing 05/27/2023 0607 by Peri Brackett, RN Outcome: Progressing   Problem: Coping: Goal: Level of anxiety will decrease 05/27/2023 0607 by Peri Brackett, RN Outcome:  Progressing 05/27/2023 0607 by Peri Brackett, RN Outcome: Progressing   Problem: Elimination: Goal: Will not experience complications related to bowel motility 05/27/2023 0607 by Peri Brackett, RN Outcome: Progressing 05/27/2023 0607 by Peri Brackett, RN Outcome: Progressing Goal: Will not experience complications related to urinary retention 05/27/2023 0607 by Peri Brackett, RN Outcome: Progressing 05/27/2023 0607 by Peri Brackett, RN Outcome: Progressing   Problem: Pain Managment: Goal: General experience of comfort will improve and/or be controlled 05/27/2023 0607 by Peri Brackett, RN Outcome: Progressing 05/27/2023 0607 by Peri Brackett, RN Outcome: Progressing   Problem: Safety: Goal: Ability to remain free from injury will improve 05/27/2023 0607 by Peri Brackett, RN Outcome: Progressing 05/27/2023 0607 by Peri Brackett, RN Outcome: Progressing   Problem: Skin Integrity: Goal: Risk for impaired skin integrity will decrease 05/27/2023 0607 by Peri Brackett, RN Outcome: Progressing 05/27/2023 0607 by Peri Brackett, RN Outcome: Progressing

## 2023-05-27 NOTE — Plan of Care (Signed)

## 2023-05-27 NOTE — Plan of Care (Signed)

## 2023-05-27 NOTE — Progress Notes (Addendum)
 PROGRESS NOTE Nichole Munoz  GNF:[id number] DOB: 07/06/1945 DOA: 05/25/2023 PCP: Inc, [facility]  Brief Narrative/Hospital Course: 25 yof w/ history of CVA with mild residual left-sided weakness and neuropathy, CKD3b- b/l creat ~ 1.6,  was 2.2 on last d/c 5/2,type 2 diabetes mellitus, hypertension, hyperlipidemia, and asthma presented to the ED 5/19 due to bilateral leg pain and new onset of immobility, and was tender to touch.   On admission history limited due to sedation. Apparently was ambulatory until 5/19 night, c/o severe leg pain and has been unable to walk since.  Reported feeling numbness and tingling in both of her legs from the knees down in addition to the pain. In the ED: Low-grade temp 100.8 tachycardia intermittently otherwise vital stable Labs: Mill elevated creatinine 2.5 from 2.2 on last discharge, lactate normal CK2 06 mild leukocytosis 13.3 chronic anemia RSV COVID flu negative UA proteinuria WBC 0-5 after cultures,empirically started on vancomycin  and Cefepime  The bilateral leg weakness was concerning for spine lesion MRI brain> no acute finding age-related cerebral atrophy.  Small remote lacunar infarct MRI C and T spine>-no acute trauma, moderate-sized paracentral disc extrusion with superior migration at C7-T1 with resultant mild to moderate spinal stenosis. Additional multilevel cervical spondylosis with resultant mild to moderate diffuse spinal stenosis at C3-4 through C6-7.Multifactorial degenerative changes with resultant multilevel foraminal narrowing. Loss of normal flow void within the right vertebral artery, which could reflect slow flow and/or occlusion  MRI lumbar spine pending in ED, apparently patient refused  Subjective: Seen and examined this morning She is alert awake resting Right knee less tender than the left states" hurts all the time" Overnight low-grade fever 100.7 BP stable, on room air Labs with mild hypokalemia 3.3 creatinine  of 2.5 mild leukocytosis  Assessment and plan:  Bilateral leg pain Difficulty ambulation/immobility History of CVA with mild residual left-sided weakness: So far workup with no acute stroke.  Patient was admitted w/ leg pain and difficulty ambulation- unclear etiology. MRI C and T-spine as above-- no acute significant findings. MRI L-spine pending but nursing report patient has refused-she is able to move lower extremities although complains of pain on knee and below. Check abi legs for completeness suspect her pain is due to her osteoarthritis from right knee checking x-ray of the bilateral knees and obtain Ortho consult. PTA on aspirin  81 and Crestor  40 continue same PT OT eval to continue  Abnormal MRI-incidental finding: Mri showed Loss of normal flow void within the right vertebral artery, which could reflect slow flow and/or occlusion will d/w neuro. Discussed with Dr. Cleone Dad- patient leg symptoms are not explained by MRI finding and felt to be incidental  Low-grade fever Leukocytosis: Concern for sepsis.No E/O pneumonia or UTI. Fu Blood culture cont current Abx vancomycin  and cefepime .leukocytosis improving  Hypokalemia: Replace.  Hypertension: Stable on amlodipine   AKI on CKD 3B : b/l creat ~ 1.6,  was 2.2 on last d/c.  Continue IV fluid hydration creatinine remains up trending up at 2.5  Recent Labs    01/13/23 0458 04/18/23 0435 05/04/23 2215 05/05/23 1126 05/06/23 0408 05/07/23 0430 05/08/23 0352 05/25/23 1138 05/26/23 0533 05/27/23 0700  BUN 30* 30* 36* 35* 48* 51* 58* 33* 32* 43*  CREATININE 1.39* 1.24* 1.08* 1.59* 1.66* 1.66* 2.21* 2.55* 2.25* 2.59*  CO2 22 26 26 23 25 22 24 22 22  22  K 4.4 3.9 3.7 4.1 4.2 4.8 4.8 4.0 3.4* 3.3*    Type 2 diabetes mellitus Diabetic neuropathy: Most recent A1c  7 on 4/29. Not on meds per list.Added sliding scale insulin   Asthma: Continue home inhaler    DVT prophylaxis: heparin  injection 5,000 Units Start: 05/26/23  1400 Code Status:   Code Status: Full Code Family Communication: plan of care discussed with patient at bedside.called son no answer then called daughter daughter and left message. Patient status is: Remains hospitalized because of severity of illness Level of care: Telemetry Medical   Dispo: The patient is from: home            Anticipated disposition: TBD Objective: Vitals last 24 hrs: Vitals:   05/26/23 1954 05/27/23 0403 05/27/23 0750 05/27/23 0837  BP: (!) 148/71 (!) 148/60 137/67   Pulse: 97 100 (!) 105   Resp: 17 16    Temp: 99.8 F (37.7 C) (!) 100.7 F (38.2 C)    TempSrc: Oral Oral    SpO2: 98% 98% 98% 98%  Weight:      Height:        Physical Examination: General exam: alert awake, older than stated age, follows commands. HEENT:Oral mucosa moist, Ear/Nose WNL grossly Respiratory system: Bilaterally clear BS, no use of accessory muscle Cardiovascular system: S1 & S2 +. Gastrointestinal system: Abdomen soft,  NT,ND,BS+ Nervous System: Alert, awake, following commands.  Moving all extremities well Extremities: LE edema neg, warm extremities, tender right knee more than the left Skin: No rashes,warm. MSK: Normal muscle bulk/tone.   Data Reviewed: I have personally reviewed following labs and imaging studies ( see epic result tab) CBC: Recent Labs  Lab 05/25/23 1150 05/26/23 0533 05/27/23 0700  WBC 13.3* 11.6* 11.4*  NEUTROABS 9.8*  --   --   HGB 10.7* 11.1* 10.0*  HCT 31.7* 32.7* 28.7*  MCV 87.3 87.4 85.2  PLT 209 190 169   CMP: Recent Labs  Lab 05/25/23 1138 05/26/23 0533 05/27/23 0700  NA 139 138 136  K 4.0 3.4* 3.3*  CL 104 107 103  CO2 22 22 22   GLUCOSE 176* 129* 241*  BUN 33* 32* 43*  CREATININE 2.55* 2.25* 2.59*  CALCIUM  9.4 9.2 8.7*   GFR: Estimated Creatinine Clearance: 12.6 mL/min (A) (by C-G formula based on SCr of 2.59 mg/dL (H)). Recent Labs  Lab 05/25/23 1138 05/27/23 0700  AST 24 24  ALT 15 13  ALKPHOS 92 88  BILITOT 0.8 0.7   PROT 8.6* 7.1  ALBUMIN 3.8 2.8*   No results for input(s): "LIPASE", "AMYLASE" in the last 168 hours. No results for input(s): "AMMONIA" in the last 168 hours. Coagulation Profile: No results for input(s): "INR", "PROTIME" in the last 168 hours. Unresulted Labs (From admission, onward)     Start     Ordered   05/27/23 0500  Comprehensive metabolic panel with GFR  Daily,   R      05/26/23 0749   05/27/23 0500  CBC  Daily,   R      05/26/23 0749           Antimicrobials/Microbiology: Anti-infectives (From admission, onward)    Start     Dose/Rate Route Frequency Ordered Stop   05/28/23 1000  vancomycin  (VANCOREADY) IVPB 500 mg/100 mL        500 mg 100 mL/hr over 60 Minutes Intravenous Every 48 hours 05/26/23 0334     05/26/23 2200  ceFEPIme  (MAXIPIME ) 1 g in sodium chloride  0.9 % 100 mL IVPB        1 g 200 mL/hr over 30 Minutes Intravenous Every 24 hours 05/26/23 0308  05/25/23 1615  vancomycin  (VANCOCIN ) IVPB 1000 mg/200 mL premix        1,000 mg 200 mL/hr over 60 Minutes Intravenous  Once 05/25/23 1601 05/26/23 0021   05/25/23 1615  ceFEPIme  (MAXIPIME ) 2 g in sodium chloride  0.9 % 100 mL IVPB        2 g 200 mL/hr over 30 Minutes Intravenous  Once 05/25/23 1601 05/25/23 2330   05/25/23 1600  metroNIDAZOLE  (FLAGYL ) IVPB 500 mg        500 mg 100 mL/hr over 60 Minutes Intravenous  Once 05/25/23 1552 05/25/23 2330         Component Value Date/Time   SDES BLOOD SITE NOT SPECIFIED 05/25/2023 1155   SPECREQUEST  05/25/2023 1155    BOTTLES DRAWN AEROBIC AND ANAEROBIC Blood Culture results may not be optimal due to an inadequate volume of blood received in culture bottles   CULT  05/25/2023 1155    NO GROWTH 2 DAYS Performed at [facility], [address] [place], [place] [address]    REPTSTATUS PENDING 05/25/2023 1155    Medications reviewed:  Scheduled Meds:  amLODipine   5 mg Oral Daily   aspirin   81 mg Oral Daily   fluticasone  furoate-vilanterol  1 puff  Inhalation Daily   heparin   5,000 Units Subcutaneous Q8H   insulin  aspart  0-5 Units Subcutaneous QHS   insulin  aspart  0-9 Units Subcutaneous TID WC   mirtazapine   30 mg Oral QHS   rosuvastatin   40 mg Oral Daily   Continuous Infusions:  sodium chloride  75 mL/hr at 05/27/23 1105   ceFEPime  (MAXIPIME ) IV 1 g (05/26/23 2135)   [START ON 05/28/2023] vancomycin      Lesa Rape, MD [facility] 05/27/2023, 3:10 PM

## 2023-05-28 ENCOUNTER — Inpatient Hospital Stay (HOSPITAL_COMMUNITY)

## 2023-05-28 DIAGNOSIS — M79604 Pain in right leg: Secondary | ICD-10-CM | POA: Diagnosis not present

## 2023-05-28 DIAGNOSIS — M79673 Pain in unspecified foot: Secondary | ICD-10-CM | POA: Diagnosis not present

## 2023-05-28 DIAGNOSIS — M79605 Pain in left leg: Secondary | ICD-10-CM | POA: Diagnosis not present

## 2023-05-28 LAB — CBC
HCT: 30.7 % — ABNORMAL LOW (ref 36.0–46.0)
HCT: 29.5 % — ABNORMAL LOW (ref 36.0–46.0)
Hemoglobin: 10.2 g/dL — ABNORMAL LOW (ref 12.0–15.0)
Hemoglobin: 10.2 g/dL — ABNORMAL LOW (ref 12.0–15.0)
MCH: 29.1 pg (ref 26.0–34.0)
MCH: 29.7 pg (ref 26.0–34.0)
MCHC: 33.2 g/dL (ref 30.0–36.0)
MCHC: 34.6 g/dL (ref 30.0–36.0)
MCV: 87.5 fL (ref 80.0–100.0)
MCV: 86 fL (ref 80.0–100.0)
Platelets: 175 10*3/uL (ref 150–400)
Platelets: 193 10*3/uL (ref 150–400)
RBC: 3.51 MIL/uL — ABNORMAL LOW (ref 3.87–5.11)
RBC: 3.43 MIL/uL — ABNORMAL LOW (ref 3.87–5.11)
RDW: 11.9 % (ref 11.5–15.5)
RDW: 11.8 % (ref 11.5–15.5)
WBC: 11 10*3/uL — ABNORMAL HIGH (ref 4.0–10.5)
WBC: 10.6 10*3/uL — ABNORMAL HIGH (ref 4.0–10.5)
nRBC: 0 % (ref 0.0–0.2)
nRBC: 0 % (ref 0.0–0.2)

## 2023-05-28 LAB — COMPREHENSIVE METABOLIC PANEL WITH GFR
ALT: 15 U/L (ref 0–44)
AST: 22 U/L (ref 15–41)
Albumin: 2.7 g/dL — ABNORMAL LOW (ref 3.5–5.0)
Alkaline Phosphatase: 99 U/L (ref 38–126)
Anion gap: 10 (ref 5–15)
BUN: 45 mg/dL — ABNORMAL HIGH (ref 8–23)
CO2: 20 mmol/L — ABNORMAL LOW (ref 22–32)
Calcium: 8.8 mg/dL — ABNORMAL LOW (ref 8.9–10.3)
Chloride: 108 mmol/L (ref 98–111)
Creatinine, Ser: 2.89 mg/dL — ABNORMAL HIGH (ref 0.44–1.00)
GFR, Estimated: 16 mL/min — ABNORMAL LOW (ref >60)
Glucose, Bld: 154 mg/dL — ABNORMAL HIGH (ref 70–99)
Potassium: 3.9 mmol/L (ref 3.5–5.1)
Sodium: 138 mmol/L (ref 135–145)
Total Bilirubin: 0.5 mg/dL (ref 0.0–1.2)
Total Protein: 7.3 g/dL (ref 6.5–8.1)

## 2023-05-28 LAB — GLUCOSE, CAPILLARY
Glucose-Capillary: 147 mg/dL — ABNORMAL HIGH (ref 70–99)
Glucose-Capillary: 352 mg/dL — ABNORMAL HIGH (ref 70–99)
Glucose-Capillary: 206 mg/dL — ABNORMAL HIGH (ref 70–99)
Glucose-Capillary: 231 mg/dL — ABNORMAL HIGH (ref 70–99)

## 2023-05-28 LAB — LACTIC ACID, PLASMA: Lactic Acid, Venous: 1.1 mmol/L (ref 0.5–1.9)

## 2023-05-28 NOTE — TOC Initial Note (Signed)
 Transition of Care [facility]) - Initial/Assessment Note    Patient Details  Name: Nichole Munoz MRN: [id number] Date of Birth: 1945-04-06  [facility] CM/SW Contact:    Elspeth Hals, LCSW Phone Number: 05/28/2023, 3:17 PM  Clinical Narrative:  Pt speaks Khmer, [employer] interpretor Ipad used to speak with pt.  Pt listed as oriented x2 but is able to participate in conversation and appears to provide some information.  Pt reports she lives with her son and daughter Nichole Munoz and Nichole Munoz, per facesheet)  Permission given for CSW to call them.  Pt reports son is driving a truck in [place] , will be gone several days.  Reports daughter is also working.  Seems to indicate that there is another son and provides a phone number but this turns out to be the same number as listed for the daughter.    CSW discussed potential DC plan to SNF with pt, to which she said she "didn't know" if she wants to go.  Pt does ask about PT coming to her house.   Then says several times that she "doesn't know".  CSW indicated will reach out to her children.  Attempted to call son Nichole Munoz, phone not active.  Attempted to call daughter Nichole Munoz, goes straight to voicemail.  VM left, message also sent to this number.                  Expected Discharge Plan:  (TBD) Barriers to Discharge: Continued Medical Work up, Other (must enter comment) (unclear DC plan at this time, attempting to reach family members)   Patient Goals and CMS Choice            Expected Discharge Plan and Services In-house Referral: Clinical Social Work     Living arrangements for the past 2 months: Single Family Home                                      Prior Living Arrangements/Services Living arrangements for the past 2 months: Single Family Home Lives with:: Adult Children Patient language and need for interpreter reviewed:: Yes (Khmer)        Need for Family Participation in Patient Care: Yes (Comment) Care giver support  system in place?: Yes (comment)   Criminal Activity/Legal Involvement Pertinent to Current Situation/Hospitalization: No - Comment as needed  Activities of Daily Living   ADL Screening (condition at time of admission) Independently performs ADLs?: No Does the patient have a NEW difficulty with bathing/dressing/toileting/self-feeding that is expected to last >3 days?: Yes (Initiates electronic notice to provider for possible OT consult) Does the patient have a NEW difficulty with getting in/out of bed, walking, or climbing stairs that is expected to last >3 days?: Yes (Initiates electronic notice to provider for possible PT consult) Does the patient have a NEW difficulty with communication that is expected to last >3 days?: Yes (Initiates electronic notice to provider for possible SLP consult) Is the patient deaf or have difficulty hearing?: No Does the patient have difficulty seeing, even when wearing glasses/contacts?: No Does the patient have difficulty concentrating, remembering, or making decisions?: No  Permission Sought/Granted Permission sought to share information with : Family Supports Permission granted to share information with : Yes, Verbal Permission Granted  Share Information with NAME: son Nichole Munoz, daughter Nichole Munoz           Emotional Assessment Appearance:: Appears stated age Attitude/Demeanor/Rapport:  Engaged Affect (typically observed): Pleasant Orientation: : Oriented to Self, Oriented to Situation      Admission diagnosis:  Leg pain, bilateral [M79.604, M79.605] AKI (acute kidney injury) (HCC) [N17.9] Fever, unspecified fever cause [R50.9] Pain in both lower extremities [M79.604, M79.605] Impaired ambulation [R26.2] Patient Active Problem List   Diagnosis Date Noted   Cognitive decline 05/26/2023   Long term (current) use of insulin  (HCC) 05/26/2023   Leg pain, bilateral 05/26/2023   Chronic kidney disease, stage 3b (HCC) 05/07/2023   Mixed diabetic  hyperlipidemia associated with type 2 diabetes mellitus (HCC) 05/07/2023   Uncontrolled type 2 diabetes mellitus with hypoglycemia, without long-term current use of insulin  (HCC) 05/05/2023   Hypothermia 05/05/2023   Syncope 01/11/2023   Hypertensive urgency 01/11/2023   Uncontrolled type 2 diabetes mellitus with hyperglycemia, with long-term current use of insulin  (HCC) 01/11/2023   History of CVA with residual deficit 01/11/2023   Malnutrition of moderate degree 07/27/2021   Akathisia 07/25/2021   GERD without esophagitis 07/24/2021   Acute medication-induced akathisia 07/23/2021   Essential hypertension 01/24/2017   Asthma due to seasonal allergies 01/24/2017   Acute metabolic encephalopathy 04/28/2016   Acute renal failure superimposed on stage 3a chronic kidney disease (HCC) 04/26/2016   Hypomagnesemia 09/24/2014   Stage 3a chronic kidney disease (CKD) (HCC) - baseline SCr 1.4 09/23/2014   Hyperlipidemia 09/23/2014   Diabetic neuropathy (HCC) 04/12/2013   PCP:  Inc, [facility] Pharmacy:   [id number]ette [facility]le, [place] - [address] AT [facility] [address] [place] [address] Phone: [phone] Fax: [phone]  [facility] [address] [place] [address] Phone: [phone] Fax: [phone]  [facility] [address] [place] [address] Phone: [phone] Fax: [phone]     Social Drivers of Health (SDOH) Social History: SDOH Screenings   Food Insecurity: No Food Insecurity (05/26/2023)  Housing: Low Risk  (05/26/2023)  Transportation Needs: No Transportation Needs (05/26/2023)  Utilities: Not At Risk (05/26/2023)  Financial Resource Strain: Not on File (04/25/2021)   Received from [place], [place]  Physical Activity: Not on File (04/25/2021)   Received from [place], [place]  Social Connections: Moderately Isolated (05/26/2023)  Stress:  Not on File (04/25/2021)   Received from [facility], [place]  Tobacco Use: Low Risk  (05/25/2023)   SDOH Interventions:     Readmission Risk Interventions     No data to display

## 2023-05-28 NOTE — Progress Notes (Signed)
 VASCULAR LAB    ABI has been performed.  See CV proc for preliminary results.   Rachana Malesky, RVT 05/28/2023, 4:16 PM

## 2023-05-28 NOTE — Progress Notes (Signed)
     Patient Name: Corene Resnick           DOB: [birth year]  MRN: [id number]      Admission Date: 05/25/2023  Attending Provider: Lesa Rape, MD  Primary Diagnosis: Leg pain, bilateral   Level of care: Telemetry Medical    Date of Service   05/28/2023   Iyauna Sing, 78 y.o. female, was admitted on 05/25/2023 for bilateral leg pain and low-grade fever.   HPI/Events of Note   Febrile, 102.6, unclear etiology Tachycardic Leukocytosis 11.4--> 11 Tachycardic, likely from fever. On tele. All other vital stable on RA.   Gram-positive rod 1 out of 4 bottles likely contamination.  Will repeat blood cultures. Chest x-ray, UA, respiratory panel negative on admission.  Currently on vancomycin  and cefepime  empirically  Bedside Assessment:  No distress reported. Remains on RA without respiratory changes. No cough, sputum, allergies, or reports or aspiration.  No reports of nausea, vomiting, diarrhea or abdominal changes.   Addendum-  2315-  Lactic acid negative. WBC - 10.6 Chest xray - No active disease.   Interventions/ Plan   Repeat blood cultures and UA Chest xray ordered by attending  Resp panel pending Labs- CBC, lactic acid Tylenol  PRN       Jochebed Bills, DNP, ACNPC- AG [facility]

## 2023-05-28 NOTE — Evaluation (Addendum)
 Physical Therapy Evaluation Patient Details Name: Nichole Munoz MRN: [id number] DOB: [birth year] Today's Date: 05/28/2023  History of Present Illness  Pt is a 78 y.o female admitted from home 5/19 for bilateral leg pain. Negative for DVT, UTI. MRI brain and spine showed no acute trauma, mild to moderate spinal stenosis. Recent admission 01/2023 for syncope, 4/28-5/2 encephalopathy  PMH: CVA with residual L sided weakness, CKD, HTN, asthma, neuropathy, DM, diverticulitiss, C7 fx 2018   Clinical Impression  Patient is agreeable to PT evaluation with encouragement. She complains mostly of right leg pain today. She is a poor historian but reports she was ambulatory prior to this hospital stay and lives with her son and daughter who both work.  Today the patient required +2 person assistance with bed mobility and for standing. Standing tolerance is minimal and patient unable to stand fully or weight shift for taking steps. Flexed posture while standing. She does not appear to be at her baseline level of functional independence. Recommend to continue PT to maximize independence and decrease caregiver burden. Consider rehabilitation < 3 hours/day after this hospital stay.       If plan is discharge home, recommend the following: Two people to help with walking and/or transfers;A lot of help with bathing/dressing/bathroom;Assistance with cooking/housework;Direct supervision/assist for medications management;Help with stairs or ramp for entrance   Can travel by private vehicle   No    Equipment Recommendations Wheelchair (measurements PT)  Recommendations for Other Services       Functional Status Assessment Patient has had a recent decline in their functional status and demonstrates the ability to make significant improvements in function in a reasonable and predictable amount of time.     Precautions / Restrictions Precautions Precautions: Fall Recall of Precautions/Restrictions:  Impaired Restrictions Weight Bearing Restrictions Per Provider Order: No      Mobility  Bed Mobility Overal bed mobility: Needs Assistance Bed Mobility: Supine to Sit, Sit to Supine     Supine to sit: Max assist Sit to supine: Mod assist   General bed mobility comments: increased time and effort required with mobility efforts. cues for sequencing    Transfers Overall transfer level: Needs assistance Equipment used: 2 person hand held assist Transfers: Sit to/from Stand Sit to Stand: Max assist, +2 physical assistance           General transfer comment: verbal cues for anterior weight shifting. +2 person assistance required    Ambulation/Gait               General Gait Details: unable to due to poor standing tolerance, pain  Stairs            Wheelchair Mobility     Tilt Bed    Modified Rankin (Stroke Patients Only)       Balance Overall balance assessment: Needs assistance Sitting-balance support: Feet supported Sitting balance-Leahy Scale: Fair Sitting balance - Comments: close stand by assistance reuqired to maintain sitting balance Postural control:  (anterior and left lean) Standing balance support: Bilateral upper extremity supported Standing balance-Leahy Scale: Zero Standing balance comment: +2 person assistance required with flexed posture despite cues for hip extension and mildine                             Pertinent Vitals/Pain Pain Assessment Pain Assessment: Faces Faces Pain Scale: Hurts even more Pain Location: right leg from the knee down Pain Descriptors / Indicators: Discomfort Pain Intervention(s): Limited activity within  patient's tolerance, Monitored during session, Repositioned    Home Living Family/patient expects to be discharged to:: Private residence Living Arrangements: Children Available Help at Discharge: Family;Available PRN/intermittently Type of Home: Apartment Home Access: Level entry      Alternate Level Stairs-Number of Steps: 13 (pt has to take 3 breaks when climbing stairs to shower) Home Layout: Two level;Bed/bath upstairs;1/2 bath on main level;Able to live on main level with bedroom/bathroom Home Equipment: Cane - single point;Rolling Walker (2 wheels) Additional Comments: patient reports her daughter and son both work and she is at home by herself for extended periods at times. no family in room to confirm this information and some information is gathered from previous hospital stay    Prior Function Prior Level of Function : Needs assist;Patient poor historian/Family not available             Mobility Comments: per prior to admission: uses [facility] for mobility ADLs Comments: per prior admission: son and daughter assist     Extremity/Trunk Assessment   Upper Extremity Assessment Upper Extremity Assessment: Defer to OT evaluation    Lower Extremity Assessment Lower Extremity Assessment: Generalized weakness (unable to formally assess as patient has difficulty with following multi step commands. she is able to perform SLR with LLE with minimal movement of RLE (with pain RLE>LLE reported))       Communication   Communication Communication: Impaired (interpreter used [id number])    Cognition Arousal: Alert Behavior During Therapy: Anxious   PT - Cognitive impairments: No family/caregiver present to determine baseline, Difficult to assess Difficult to assess due to: Non-English speaking                     PT - Cognition Comments: patient is able to state her name but does not know her birthdate. she is oriented to hospital. she needs intermittent repetition and multi modal cues for single step command following Following commands: Impaired Following commands impaired: Follows one step commands inconsistently, Follows one step commands with increased time     Cueing Cueing Techniques: Verbal cues, Gestural cues, Tactile cues, Visual cues      General Comments      Exercises     Assessment/Plan    PT Assessment Patient needs continued PT services  PT Problem List Decreased strength;Decreased range of motion;Decreased activity tolerance;Decreased balance;Decreased mobility;Decreased safety awareness;Pain;Decreased cognition       PT Treatment Interventions DME instruction;Gait training;Stair training;Functional mobility training;Therapeutic activities;Therapeutic exercise;Neuromuscular re-education;Cognitive remediation;Balance training;Patient/family education;Wheelchair mobility training    PT Goals (Current goals can be found in the Care Plan section)  Acute Rehab PT Goals Patient Stated Goal: none stated PT Goal Formulation: Patient unable to participate in goal setting Time For Goal Achievement: 06/11/23 Potential to Achieve Goals: Fair    Frequency Min 2X/week     Co-evaluation PT/OT/SLP Co-Evaluation/Treatment: Yes Reason for Co-Treatment: Complexity of the patient's impairments (multi-system involvement);To address functional/ADL transfers PT goals addressed during session: Mobility/safety with mobility         AM-PAC PT "6 Clicks" Mobility  Outcome Measure Help needed turning from your back to your side while in a flat bed without using bedrails?: A Lot Help needed moving from lying on your back to sitting on the side of a flat bed without using bedrails?: Total Help needed moving to and from a bed to a chair (including a wheelchair)?: Total Help needed standing up from a chair using your arms (e.g., wheelchair or bedside chair)?: Total Help needed to  walk in hospital room?: Total Help needed climbing 3-5 steps with a railing? : Total 6 Click Score: 7    End of Session   Activity Tolerance: Patient limited by fatigue;Patient limited by pain Patient left: in bed;with call bell/phone within reach;with bed alarm set Nurse Communication: Mobility status PT Visit Diagnosis: Unsteadiness on feet  (R26.81);Difficulty in walking, not elsewhere classified (R26.2)    Time: 6045-4098 PT Time Calculation (min) (ACUTE ONLY): 18 min   Charges:   PT Evaluation $PT Eval Moderate Complexity: 1 Mod   PT General Charges $$ ACUTE PT VISIT: 1 Visit         Nichole Munoz, PT, MPT   Nichole Munoz 05/28/2023, 12:01 PM

## 2023-05-28 NOTE — Progress Notes (Signed)
 Patient ID: Nichole Munoz, female   DOB: 04-05-1945, 78 y.o.   MRN: [id number] BP 120/84 (BP Location: Right Arm)   Pulse (!) 121   Temp (!) 102.2 F (39 C)   Resp 17   Ht 5' (1.524 m)   Wt 45.4 kg   SpO2 98%   BMI 19.53 kg/m  I have been to her room on three occasions hoping for family. The language barrier even with the translation service I still found it very difficult to obtain a reliable history.  From my understanding at this time her right knee is causing her the most pain. While she may have some back pain that is not the biggest component of the pain. Her back has multiple degenerative problems, but none of these structural changes are acute. The onset of her pain is acute. From her standpoint the knee is the source. As stated however I do not trust my history. I will continue to visit the room and hope family is available.  She moved all extremities while lying in bed.

## 2023-05-28 NOTE — Progress Notes (Signed)
 PROGRESS NOTE Nichole Munoz  ZOX:[id number] DOB: Oct 05, 1945 DOA: 05/25/2023 PCP: [facility]  Brief Narrative/Hospital Course: 49 yof w/ history of CVA with mild residual left-sided weakness and neuropathy, CKD3b- b/l creat ~ 1.6,  was 2.2 on last d/c 5/2,type 2 diabetes mellitus, hypertension, hyperlipidemia, and asthma presented to the ED 5/19 due to bilateral leg pain and new onset of immobility, and was tender to touch.   On admission history limited due to sedation. Apparently was ambulatory until 5/19 night, c/o severe leg pain and has been unable to walk since.  Reported feeling numbness and tingling in both of her legs from the knees down in addition to the pain. In the ED: Low-grade temp 100.8 tachycardia intermittently otherwise vital stable Labs: Mill elevated creatinine 2.5 from 2.2 on last discharge, lactate normal CK2 06 mild leukocytosis 13.3 chronic anemia RSV COVID flu negative UA proteinuria WBC 0-5 after cultures drawn empirically started on vancomycin  and Cefepime  MRI brain> no acute finding age-related cerebral atrophy.  Small remote lacunar infarct MRI C and T spine>-no acute trauma, moderate-sized paracentral disc extrusion with superior migration at C7-T1 with resultant mild to moderate spinal stenosis. Additional multilevel cervical spondylosis with resultant mild to moderate diffuse spinal stenosis at C3-4 through C6-7.Multifactorial degenerative changes with resultant multilevel foraminal narrowing. Loss of normal flow void within the right vertebral artery, which could reflect slow flow and/or occlusion  MRI lumbar spine> pending in ED, but she had refused.  Subjective: Seen and examined Overnight patient is afebrile BP stable no hypotension Labs reviewed CBC overall stable wbc at 11, BMP with creatinine trending up 2.8 Co pain in in leg, knee bilateral Little lower back pain She is tender tt touch all area below knee and ankle. And  tearful Painful movement but states unable to move them as it hursts " I can't walk, it is painful" Able to withdraw legs to pain. Able to stand with PT and could not walk  Assessment and plan:  Bilateral leg pain Difficulty ambulation/immobility History of CVA with mild residual left-sided weakness: So far workup with no acute stroke,unclear etiology.MRI C and T spine w/ djd and other findings see above  MRI L-spine pending, she had refused> subsequently were able to obtain after Ativan  sedation and reported 4/21 night> disc protrusion, facet hypertrophy, disc bulges see report for detail.  Patient not having any tenderness, x-ray were obtained for bilateral knees> and seen by orthopedics- knees not felt to be etiology, suspecting radicular pain but is tender to touch b/l knee, sensation intact bilaterally. Consulted Dr Benedetta Bradley  from neurosurgery and awaiting input ABI-pending .  Duplex bilateral lower extremity no DVT  PTA on aspirin  81 and Crestor  40 continue same Cont PT OT.  Abnormal MRI-incidental finding: Mri showed Loss of normal flow void within the right vertebral artery, which could reflect slow flow and/or occlusion will d/w neuro. Discussed with Dr. Cleone Dad- patient leg symptoms are not explained by MRI finding and felt to be incidental  Low-grade fever Leukocytosis Gram-positive rod 1 out of 4 bottles likely contamination: So far blood culture w/ gram-positive rod -likely contamination.No evidence of pneumonia or UTI. On vancomycin  and cefepime  empirically leukocytosis stable.  Follow-up culture sensitivity final report  Hypokalemia: Resolved   Hypertension: Stable on amlodipine   AKI on CKD 3B : b/l creat ~ 1.6,  was 2.2 on last discharge.  Creatinine remains elevated around 2.8 this morning continue on IV hydration monitor intake output and renal function closely Recent Labs  04/18/23 0435 05/04/23 2215 05/05/23 1126 05/06/23 0408 05/07/23 0430 05/08/23 0352  05/25/23 1138 05/26/23 0533 05/27/23 0700 05/28/23 0522  BUN 30* 36* 35* 48* 51* 58* 33* 32* 43* 45*  CREATININE 1.24* 1.08* 1.59* 1.66* 1.66* 2.21* 2.55* 2.25* 2.59* 2.89*  CO2 26 26 23 25 22 24 22 22 22  20*  K 3.9 3.7 4.1 4.2 4.8 4.8 4.0 3.4* 3.3* 3.9    Type 2 diabetes mellitus Diabetic neuropathy: Most recent A1c 7 on 4/29. Not on meds per list. Cont sliding scale insulin  Recent Labs  Lab 05/27/23 1122 05/27/23 1716 05/27/23 2111 05/28/23 0628  GLUCAP 335* 151* 267* 147*    Asthma: Continue home inhaler  Living situation: Pt tells ,e she was dropped to ED by her son and he went to [place]-He is a [employer] architect.  I was able ot get additionalno> Soun- [phone] but no answer.  Called the number listed for the daughter on the sound in the epic no answer Used khmer interpretation line to communicate with the patient-still having some communication issues  DVT prophylaxis: Place TED hose Start: 05/27/23 1554 heparin  injection 5,000 Units Start: 05/26/23 1400 Code Status:   Code Status: Full Code Family Communication: plan of care discussed with patient at bedside. I called son and daughter no no answer Patient status is: Remains hospitalized because of severity of illness Level of care: Telemetry Medical   Dispo: The patient is from: home            Anticipated disposition: TBD Objective: Vitals last 24 hrs: Vitals:   05/27/23 2008 05/28/23 0421 05/28/23 0724 05/28/23 0820  BP: 127/60 125/83 125/69   Pulse: 95 96 81 86  Resp: 16 16  16   Temp: 99.6 F (37.6 C) 98.2 F (36.8 C) 97.7 F (36.5 C)   TempSrc:      SpO2: 100% 99% 99%   Weight:      Height:        Physical Examination: General exam: alert awake, oriented  HEENT:Oral mucosa moist, Ear/Nose WNL grossly Respiratory system: Bilaterally clear BS,no use of accessory muscle Cardiovascular system: S1 & S2 +, No JVD. Gastrointestinal system: Abdomen soft,NT,ND, BS+ Nervous System: Alert, awake, moving  UE le-  sensation intact b/l.  Endorses pain tenderness in the leg limiting mobility Extremities: LE edema neg,distal peripheral pulses palpable and warm.  Skin: No rashes,no icterus. MSK: Normal muscle bulk,tone  Data Reviewed: I have personally reviewed following labs and imaging studies ( see epic result tab) CBC: Recent Labs  Lab 05/25/23 1150 05/26/23 0533 05/27/23 0700 05/28/23 0522  WBC 13.3* 11.6* 11.4* 11.0*  NEUTROABS 9.8*  --   --   --   HGB 10.7* 11.1* 10.0* 10.2*  HCT 31.7* 32.7* 28.7* 30.7*  MCV 87.3 87.4 85.2 87.5  PLT 209 190 169 175   CMP: Recent Labs  Lab 05/25/23 1138 05/26/23 0533 05/27/23 0700 05/28/23 0522  NA 139 138 136 138  K 4.0 3.4* 3.3* 3.9  CL 104 107 103 108  CO2 22 22 22  20*  GLUCOSE 176* 129* 241* 154*  BUN 33* 32* 43* 45*  CREATININE 2.55* 2.25* 2.59* 2.89*  CALCIUM  9.4 9.2 8.7* 8.8*   GFR: Estimated Creatinine Clearance: 11.3 mL/min (A) (by C-G formula based on SCr of 2.89 mg/dL (H)). Recent Labs  Lab 05/25/23 1138 05/27/23 0700 05/28/23 0522  AST 24 24 22   ALT 15 13 15   ALKPHOS 92 88 99  BILITOT 0.8 0.7 0.5  PROT  8.6* 7.1 7.3  ALBUMIN 3.8 2.8* 2.7*   No results for input(s): "LIPASE", "AMYLASE" in the last 168 hours. No results for input(s): "AMMONIA" in the last 168 hours. Coagulation Profile: No results for input(s): "INR", "PROTIME" in the last 168 hours. Unresulted Labs (From admission, onward)     Start     Ordered   05/27/23 0500  Comprehensive metabolic panel with GFR  Daily,   R      05/26/23 0749   05/27/23 0500  CBC  Daily,   R      05/26/23 0749           Antimicrobials/Microbiology: Anti-infectives (From admission, onward)    Start     Dose/Rate Route Frequency Ordered Stop   05/28/23 1000  vancomycin  (VANCOREADY) IVPB 500 mg/100 mL        500 mg 100 mL/hr over 60 Minutes Intravenous Every 48 hours 05/26/23 0334     05/26/23 2200  ceFEPIme  (MAXIPIME ) 1 g in sodium chloride  0.9 % 100 mL IVPB        1 g 200  mL/hr over 30 Minutes Intravenous Every 24 hours 05/26/23 0308     05/25/23 1615  vancomycin  (VANCOCIN ) IVPB 1000 mg/200 mL premix        1,000 mg 200 mL/hr over 60 Minutes Intravenous  Once 05/25/23 1601 05/26/23 0021   05/25/23 1615  ceFEPIme  (MAXIPIME ) 2 g in sodium chloride  0.9 % 100 mL IVPB        2 g 200 mL/hr over 30 Minutes Intravenous  Once 05/25/23 1601 05/25/23 2330   05/25/23 1600  metroNIDAZOLE  (FLAGYL ) IVPB 500 mg        500 mg 100 mL/hr over 60 Minutes Intravenous  Once 05/25/23 1552 05/25/23 2330         Component Value Date/Time   SDES BLOOD SITE NOT SPECIFIED 05/25/2023 1155   SPECREQUEST  05/25/2023 1155    BOTTLES DRAWN AEROBIC AND ANAEROBIC Blood Culture results may not be optimal due to an inadequate volume of blood received in culture bottles   CULT GRAM POSITIVE RODS 05/25/2023 1155   REPTSTATUS PENDING 05/25/2023 1155    Medications reviewed:  Scheduled Meds:  amLODipine   5 mg Oral Daily   aspirin   81 mg Oral Daily   fluticasone  furoate-vilanterol  1 puff Inhalation Daily   heparin   5,000 Units Subcutaneous Q8H   insulin  aspart  0-5 Units Subcutaneous QHS   insulin  aspart  0-9 Units Subcutaneous TID WC   mirtazapine   30 mg Oral QHS   rosuvastatin   40 mg Oral Daily   Continuous Infusions:  sodium chloride  100 mL/hr at 05/28/23 0834   ceFEPime  (MAXIPIME ) IV 1 g (05/27/23 2108)   vancomycin  500 mg (05/28/23 0842)   Lesa Rape, MD [facility] 05/28/2023, 1:13 PM

## 2023-05-28 NOTE — Care Management Important Message (Signed)
 Important Message  Patient Details  Name: Nichole Munoz MRN: [id number] Date of Birth: Apr 27, 1945   Important Message Given:  Yes - Medicare IM     Felix Host 05/28/2023, 12:43 PM

## 2023-05-28 NOTE — Evaluation (Signed)
 Occupational Therapy Evaluation Patient Details Name: Nichole Munoz MRN: [id number] DOB: [birth year] Today's Date: 05/28/2023   History of Present Illness   Pt is a 78 y.o female admitted from home 5/19 for bilateral leg pain. Negative for DVT, UTI. MRI brain and spine showed no acute trauma, mild to moderate spinal stenosis. Recent admission 01/2023 for syncope, 4/28-5/2 encephalopathy  PMH: CVA with residual L sided weakness, CKD, HTN, asthma, neuropathy, DM, diverticulitiss, C7 fx 2018     Clinical Impressions Pt admitted based on above, and was seen based on problem list below. Per chart review pt from home with daughter and son assisting with ADLs. Today pt is requiring set up  to max +2  assist for ADLs. Bed mobility and functional transfers are  max +2 . Pt presenting with decreased cog, with difficulty following commands. Recommendation of <3 hours of skilled rehab daily. OT will continue to follow acutely to maximize functional independence.        If plan is discharge home, recommend the following:   Two people to help with walking and/or transfers;A lot of help with bathing/dressing/bathroom     Functional Status Assessment   Patient has had a recent decline in their functional status and demonstrates the ability to make significant improvements in function in a reasonable and predictable amount of time.     Equipment Recommendations   Other (comment) (Defer to next venue)     Recommendations for Other Services         Precautions/Restrictions   Precautions Precautions: Fall Recall of Precautions/Restrictions: Impaired Restrictions Weight Bearing Restrictions Per Provider Order: No     Mobility Bed Mobility Overal bed mobility: Needs Assistance Bed Mobility: Supine to Sit, Sit to Supine     Supine to sit: Max assist, +2 for physical assistance Sit to supine: Mod assist   General bed mobility comments: increased time and effort    Transfers Overall  transfer level: Needs assistance Equipment used: 2 person hand held assist Transfers: Sit to/from Stand Sit to Stand: Max assist, +2 physical assistance           General transfer comment: +2 HH assist, pt unable to maintain standing for >5 minutes      Balance Overall balance assessment: Needs assistance Sitting-balance support: Feet supported Sitting balance-Leahy Scale: Fair Sitting balance - Comments: CGA to maintain sitting EOB Postural control: Left lateral lean Standing balance support: Bilateral upper extremity supported Standing balance-Leahy Scale: Zero Standing balance comment: reliant on external support       ADL either performed or assessed with clinical judgement   ADL Overall ADL's : Needs assistance/impaired Eating/Feeding: Sitting;Set up   Grooming: Set up;Sitting   Upper Body Bathing: Set up;Sitting   Lower Body Bathing: Maximal assistance;+2 for physical assistance;Sit to/from stand   Upper Body Dressing : Set up;Sitting   Lower Body Dressing: Maximal assistance;+2 for physical assistance;Sit to/from stand     General ADL Comments: Pt weak and fatigues easily unable to attempt transfer today     Vision Baseline Vision/History: 1 Wears glasses Vision Assessment?: No apparent visual deficits            Pertinent Vitals/Pain Pain Assessment Pain Assessment: Faces Faces Pain Scale: Hurts even more Pain Location: RLE Pain Descriptors / Indicators: Discomfort Pain Intervention(s): Limited activity within patient's tolerance     Extremity/Trunk Assessment Upper Extremity Assessment Upper Extremity Assessment: Generalized weakness   Lower Extremity Assessment Lower Extremity Assessment: Defer to PT evaluation  Communication Communication Communication: Impaired (interpreter used [id number]) Factors Affecting Communication: Non - English speaking, interpreter not available (Ipad interpreter)   Cognition Arousal: Alert Behavior  During Therapy: Anxious Cognition: Cognition impaired   Orientation impairments: Time, Person (pt unable to recall birthday) Awareness: Intellectual awareness intact, Online awareness intact Memory impairment (select all impairments): Working memory Attention impairment (select first level of impairment): Sustained attention Executive functioning impairment (select all impairments): Organization, Sequencing, Reasoning, Problem solving OT - Cognition Comments: Pt able to state deficits, but obvious confusion delayed processing speed       Following commands: Impaired Following commands impaired: Follows one step commands inconsistently, Follows one step commands with increased time     Cueing  General Comments   Cueing Techniques: Verbal cues;Gestural cues;Tactile cues;Visual cues  Ipad interpreter used           Home Living Family/patient expects to be discharged to:: Private residence Living Arrangements: Children Available Help at Discharge: Family;Available PRN/intermittently Type of Home: Apartment Home Access: Level entry     Home Layout: Two level;Bed/bath upstairs;1/2 bath on main level;Able to live on main level with bedroom/bathroom Alternate Level Stairs-Number of Steps: 13 (pt has to take 3 breaks when climbing stairs to shower)   Bathroom Shower/Tub: Tub/shower unit;Sponge bathes at baseline   [employer]: Standard     Home Equipment: Cane - single Librarian, academic (2 wheels)   Additional Comments: pt reports her daughter and son both work and she is at home by herself for extended periods at times. no family in room to confirm this information and some information is gathered from previous hospital stay      Prior Functioning/Environment Prior Level of Function : Needs assist;Patient poor historian/Family not available     Mobility Comments: per prior to admission: uses [facility] for mobility ADLs Comments: per prior admission: son and daughter  assist    OT Problem List: Decreased strength;Decreased activity tolerance;Impaired balance (sitting and/or standing);Impaired vision/perception;Decreased coordination;Decreased cognition;Decreased safety awareness;Decreased knowledge of use of DME or AE   OT Treatment/Interventions: Self-care/ADL training;Therapeutic exercise;Neuromuscular education;Energy conservation;DME and/or AE instruction;Therapeutic activities;Cognitive remediation/compensation;Visual/perceptual remediation/compensation;Patient/family education;Balance training      OT Goals(Current goals can be found in the care plan section)   Acute Rehab OT Goals Patient Stated Goal: To get stronger OT Goal Formulation: With patient Time For Goal Achievement: 06/11/23 Potential to Achieve Goals: Good   OT Frequency:  Min 2X/week    Co-evaluation PT/OT/SLP Co-Evaluation/Treatment: Yes Reason for Co-Treatment: Complexity of the patient's impairments (multi-system involvement);To address functional/ADL transfers PT goals addressed during session: Mobility/safety with mobility OT goals addressed during session: ADL's and self-care      AM-PAC OT "6 Clicks" Daily Activity     Outcome Measure Help from another person eating meals?: None Help from another person taking care of personal grooming?: A Little Help from another person toileting, which includes using toliet, bedpan, or urinal?: Total Help from another person bathing (including washing, rinsing, drying)?: A Lot Help from another person to put on and taking off regular upper body clothing?: A Little Help from another person to put on and taking off regular lower body clothing?: A Lot 6 Click Score: 15   End of Session Equipment Utilized During Treatment: Gait belt Nurse Communication: Mobility status  Activity Tolerance: Patient limited by fatigue;Patient limited by pain Patient left: in bed;with call bell/phone within reach;with bed alarm set  OT Visit  Diagnosis: Unsteadiness on feet (R26.81);History of falling (Z91.81);Muscle weakness (generalized) (M62.81);Pain  Time: 5409-8119 OT Time Calculation (min): 16 min Charges:  OT General Charges $OT Visit: 1 Visit OT Evaluation $OT Eval Moderate Complexity: 1 Mod  Delmer Ferraris, OT  Acute Rehabilitation Services Office [phone] Secure chat preferred   Mickael Alamo 05/28/2023, 12:33 PM

## 2023-05-28 NOTE — Progress Notes (Signed)
 Patient ID: Nichole Munoz, female   DOB: 01-May-1945, 78 y.o.   MRN: [id number] No interpreter available earlier this morning when I went to see the patient. Will try again later today.  The MRI shows multiple changes, none of which appear to be acute. But will obviously need to speak with patient to obtain her history.

## 2023-05-28 NOTE — Plan of Care (Signed)

## 2023-05-28 NOTE — Progress Notes (Signed)
 PHARMACY - PHYSICIAN COMMUNICATION CRITICAL VALUE ALERT - BLOOD CULTURE IDENTIFICATION (BCID)  Nichole Munoz is an 78 y.o. female who presented to [facility] on 05/25/2023 with a chief complaint of leg pain and immobility.  Assessment:  Admitted for further w/u and started on broad-spectrum ABX for fever and leukocytosis, now growing GPR in 1 of 4 blood cx bottles, likely contaminant.  Name of physician Contacted: SSundil MD  Current antibiotics: vancomycin  and cefepime   Changes to prescribed antibiotics recommended:  No changes needed  [name], PharmD, BCPS  05/28/2023  4:42 AM

## 2023-05-29 DIAGNOSIS — M79604 Pain in right leg: Secondary | ICD-10-CM | POA: Diagnosis not present

## 2023-05-29 DIAGNOSIS — M79605 Pain in left leg: Secondary | ICD-10-CM | POA: Diagnosis not present

## 2023-05-29 LAB — RESPIRATORY PANEL BY PCR

## 2023-05-29 LAB — GLUCOSE, CAPILLARY
Glucose-Capillary: 175 mg/dL — ABNORMAL HIGH (ref 70–99)
Glucose-Capillary: 329 mg/dL — ABNORMAL HIGH (ref 70–99)
Glucose-Capillary: 276 mg/dL — ABNORMAL HIGH (ref 70–99)
Glucose-Capillary: 594 mg/dL (ref 70–99)

## 2023-05-29 LAB — URINALYSIS, ROUTINE W REFLEX MICROSCOPIC
Bilirubin Urine: NEGATIVE
Glucose, UA: 150 mg/dL — AB
Ketones, ur: NEGATIVE mg/dL
Leukocytes,Ua: NEGATIVE
Nitrite: NEGATIVE
Protein, ur: 100 mg/dL — AB
Specific Gravity, Urine: 1.012 (ref 1.005–1.030)
pH: 7 (ref 5.0–8.0)

## 2023-05-29 LAB — COMPREHENSIVE METABOLIC PANEL WITH GFR
ALT: 30 U/L (ref 0–44)
AST: 53 U/L — ABNORMAL HIGH (ref 15–41)
Albumin: 2.4 g/dL — ABNORMAL LOW (ref 3.5–5.0)
Alkaline Phosphatase: 170 U/L — ABNORMAL HIGH (ref 38–126)
Anion gap: 9 (ref 5–15)
BUN: 35 mg/dL — ABNORMAL HIGH (ref 8–23)
CO2: 20 mmol/L — ABNORMAL LOW (ref 22–32)
Calcium: 8.5 mg/dL — ABNORMAL LOW (ref 8.9–10.3)
Chloride: 109 mmol/L (ref 98–111)
Creatinine, Ser: 2.66 mg/dL — ABNORMAL HIGH (ref 0.44–1.00)
GFR, Estimated: 18 mL/min — ABNORMAL LOW (ref >60)
Glucose, Bld: 307 mg/dL — ABNORMAL HIGH (ref 70–99)
Potassium: 3.7 mmol/L (ref 3.5–5.1)
Sodium: 138 mmol/L (ref 135–145)
Total Bilirubin: 0.2 mg/dL (ref 0.0–1.2)
Total Protein: 7.1 g/dL (ref 6.5–8.1)

## 2023-05-29 LAB — CBC
HCT: 28.7 % — ABNORMAL LOW (ref 36.0–46.0)
Hemoglobin: 9.7 g/dL — ABNORMAL LOW (ref 12.0–15.0)
MCH: 29.1 pg (ref 26.0–34.0)
MCHC: 33.8 g/dL (ref 30.0–36.0)
MCV: 86.2 fL (ref 80.0–100.0)
Platelets: 167 10*3/uL (ref 150–400)
RBC: 3.33 MIL/uL — ABNORMAL LOW (ref 3.87–5.11)
RDW: 11.8 % (ref 11.5–15.5)
WBC: 9.5 10*3/uL (ref 4.0–10.5)
nRBC: 0 % (ref 0.0–0.2)

## 2023-05-29 LAB — VAS US ABI WITH/WO TBI
Left ABI: 0.81
Right ABI: 0.96

## 2023-05-29 LAB — GLUCOSE, RANDOM: Glucose, Bld: 696 mg/dL (ref 70–99)

## 2023-05-29 MED ORDER — OXYCODONE HCL 5 MG PO TABS: 2.5000 mg | ORAL_TABLET | ORAL | Status: DC | PRN

## 2023-05-29 MED ORDER — INSULIN ASPART 100 UNIT/ML IJ SOLN
20.0000 [IU] | Freq: Once | INTRAMUSCULAR | Status: AC
  Administered 2023-05-29: 20 [IU] via SUBCUTANEOUS

## 2023-05-29 MED ORDER — GABAPENTIN 100 MG PO CAPS
200.0000 mg | ORAL_CAPSULE | Freq: Three times a day (TID) | ORAL | Status: DC
  Administered 2023-05-29 – 2023-06-03 (×17): 200 mg via ORAL
  Filled 2023-05-29 (×17): qty 2

## 2023-05-29 MED ORDER — INSULIN ASPART 100 UNIT/ML IJ SOLN
0.0000 [IU] | Freq: Three times a day (TID) | INTRAMUSCULAR | Status: DC
  Administered 2023-05-29: 8 [IU] via SUBCUTANEOUS

## 2023-05-29 MED ORDER — PREDNISONE 20 MG PO TABS
40.0000 mg | ORAL_TABLET | Freq: Every day | ORAL | Status: DC
Start: 2023-06-03 — End: 2023-06-08
  Administered 2023-06-03: 40 mg via ORAL
  Filled 2023-05-29 (×2): qty 2

## 2023-05-29 MED ORDER — ACETAMINOPHEN 325 MG PO TABS
650.0000 mg | ORAL_TABLET | Freq: Four times a day (QID) | ORAL | Status: DC | PRN
Start: 2023-05-29 — End: 2023-06-03
  Administered 2023-05-29: 650 mg via ORAL
  Filled 2023-05-29: qty 2

## 2023-05-29 MED ORDER — DICLOFENAC SODIUM 1 % EX GEL
4.0000 g | Freq: Four times a day (QID) | CUTANEOUS | Status: DC
  Administered 2023-05-29 – 2023-06-03 (×21): 4 g via TOPICAL
  Filled 2023-05-29 (×2): qty 100

## 2023-05-29 MED ORDER — PREDNISONE 20 MG PO TABS
60.0000 mg | ORAL_TABLET | Freq: Every day | ORAL | Status: AC
  Administered 2023-05-29 – 2023-06-02 (×5): 60 mg via ORAL
  Filled 2023-05-29 (×5): qty 3

## 2023-05-29 MED ORDER — PREDNISONE 20 MG PO TABS: 20.0000 mg | ORAL_TABLET | Freq: Every day | ORAL | Status: DC

## 2023-05-29 NOTE — Inpatient Diabetes Management (Signed)
 Inpatient Diabetes Program Recommendations  AACE/ADA: New Consensus Statement on Inpatient Glycemic Control (2015)  Target Ranges:  Prepandial:   less than 140 mg/dL      Peak postprandial:   less than 180 mg/dL (1-2 hours)      Critically ill patients:  140 - 180 mg/dL   Lab Results  Component Value Date   GLUCAP 329 (H) 05/29/2023   HGBA1C 7.0 (H) 05/05/2023    Review of Glycemic Control  Latest Reference Range & Units 05/28/23 06:28 05/28/23 11:17 05/28/23 17:02 05/28/23 22:31 05/29/23 05:51 05/29/23 11:13  Glucose-Capillary 70 - 99 mg/dL 829 (H) 562 (H) 130 (H) 231 (H) 175 (H) 329 (H)  (H): Data is abnormally high  Diabetes history: DM2 Outpatient Diabetes medications: none Current orders for Inpatient glycemic control:  Novolog  0-9 units TID  and 0-5 units at bedtime Prednisone  taper  Inpatient Diabetes Program Recommendations:    Postprandials elevated, please consider:  Novolog  3 units TID with meals if she consumes at least 50%.  Thank you, Hays Lipschutz, MSN, CDCES Diabetes Coordinator Inpatient Diabetes Program [phone] (team pager from 8a-5p)

## 2023-05-29 NOTE — NC FL2 (Signed)
 [place]  MEDICAID FL2 LEVEL OF CARE FORM     IDENTIFICATION  Patient Name: Nichole Munoz Birthdate: September 21, 1945 Sex: female Admission Date (Current Location): 05/25/2023  [place] and [place] Number:  Ernesto Heady [id number] G Facility and Address:  [facility] [place], [address], [place], [place] [address]      Provider Number: [id number]  Attending Physician Name and Address:  [place], Eric J, DO  Relative Name and Phone Number:  Madelein, Mahadeo Daughter [phone]    Current Level of Care: Hospital Recommended Level of Care: Skilled Nursing Facility Prior Approval Number:    Date Approved/Denied:   PASRR Number: [id number]  Discharge Plan: SNF    Current Diagnoses: Patient Active Problem List   Diagnosis Date Noted   Cognitive decline 05/26/2023   Long term (current) use of insulin  (HCC) 05/26/2023   Leg pain, bilateral 05/26/2023   Chronic kidney disease, stage 3b (HCC) 05/07/2023   Mixed diabetic hyperlipidemia associated with type 2 diabetes mellitus (HCC) 05/07/2023   Uncontrolled type 2 diabetes mellitus with hypoglycemia, without long-term current use of insulin  (HCC) 05/05/2023   Hypothermia 05/05/2023   Syncope 01/11/2023   Hypertensive urgency 01/11/2023   Uncontrolled type 2 diabetes mellitus with hyperglycemia, with long-term current use of insulin  (HCC) 01/11/2023   History of CVA with residual deficit 01/11/2023   Malnutrition of moderate degree 07/27/2021   Akathisia 07/25/2021   GERD without esophagitis 07/24/2021   Acute medication-induced akathisia 07/23/2021   Essential hypertension 01/24/2017   Asthma due to seasonal allergies 01/24/2017   Acute metabolic encephalopathy 04/28/2016   Acute renal failure superimposed on stage 3a chronic kidney disease (HCC) 04/26/2016   Hypomagnesemia 09/24/2014   Stage 3a chronic kidney disease (CKD) (HCC) - baseline SCr 1.4 09/23/2014   Hyperlipidemia 09/23/2014   Diabetic neuropathy (HCC)  04/12/2013    Orientation RESPIRATION BLADDER Height & Weight     Self, Situation, Place  Normal Continent, External catheter Weight: 100 lb (45.4 kg) Height:  5' (152.4 cm)  BEHAVIORAL SYMPTOMS/MOOD NEUROLOGICAL BOWEL NUTRITION STATUS      Continent Diet (see discharge summary)  AMBULATORY STATUS COMMUNICATION OF NEEDS Skin   Total Care Verbally Normal                       Personal Care Assistance Level of Assistance  Bathing, Feeding, Dressing Bathing Assistance: Maximum assistance Feeding assistance: Limited assistance Dressing Assistance: Maximum assistance     Functional Limitations Info  Sight, Hearing, Speech Sight Info: Adequate Hearing Info: Adequate Speech Info: Adequate (speaks Khmer)    SPECIAL CARE FACTORS FREQUENCY  PT (By licensed PT), OT (By licensed OT)     PT Frequency: 5x week OT Frequency: 5x week            Contractures Contractures Info: Not present    Additional Factors Info  Code Status, Allergies, Insulin  Sliding Scale Code Status Info: full Allergies Info: Chicken Allergy, Sodium Hypochlorite   Insulin  Sliding Scale Info: Novolog : see discharge summary       Current Medications (05/29/2023):  This is the current hospital active medication list Current Facility-Administered Medications  Medication Dose Route Frequency Provider Last Rate Last Admin   0.9 %  sodium chloride  infusion   Intravenous Continuous Kc, Ramesh, MD 100 mL/hr at 05/29/23 0611 New Bag at 05/29/23 8413   acetaminophen  (TYLENOL ) tablet 650 mg  650 mg Oral Q6H PRN Lesa Rape, MD   650 mg at 05/29/23 0825   albuterol  (PROVENTIL ) (2.5  MG/3ML) 0.083% nebulizer solution 2.5 mg  2.5 mg Inhalation Q6H PRN Willadean Hark, MD       amLODipine  (NORVASC ) tablet 5 mg  5 mg Oral Daily Claiborne, Claudia, MD   5 mg at 05/29/23 0272   aspirin  chewable tablet 81 mg  81 mg Oral Daily Claiborne, Claudia, MD   81 mg at 05/29/23 5366   ceFEPIme  (MAXIPIME ) 1 g in sodium chloride   0.9 % 100 mL IVPB  1 g Intravenous Q24H Willadean Hark, MD 200 mL/hr at 05/28/23 2131 1 g at 05/28/23 2131   fluticasone  furoate-vilanterol (BREO ELLIPTA ) 200-25 MCG/ACT 1 puff  1 puff Inhalation Daily Claiborne, Claudia, MD   1 puff at 05/29/23 4403   gabapentin  (NEURONTIN ) capsule 200 mg  200 mg Oral TID [place], Eric J, DO   200 mg at 05/29/23 4742   heparin  injection 5,000 Units  5,000 Units Subcutaneous Q8H Willadean Hark, MD   5,000 Units at 05/29/23 5956   HYDROmorphone  (DILAUDID ) injection 0.25 mg  0.25 mg Intravenous Q4H PRN Willadean Hark, MD   0.25 mg at 05/27/23 1551   insulin  aspart (novoLOG ) injection 0-5 Units  0-5 Units Subcutaneous QHS Lesa Rape, MD   2 Units at 05/28/23 2232   insulin  aspart (novoLOG ) injection 0-9 Units  0-9 Units Subcutaneous TID WC Lesa Rape, MD   2 Units at 05/29/23 0825   mirtazapine  (REMERON ) tablet 30 mg  30 mg Oral QHS Claiborne, Claudia, MD   30 mg at 05/28/23 2132   predniSONE  (DELTASONE ) tablet 60 mg  60 mg Oral Q breakfast [place], Eric J, DO   60 mg at 05/29/23 3875   Followed by   Cecily Cohen ON 06/03/2023] predniSONE  (DELTASONE ) tablet 40 mg  40 mg Oral Q breakfast [place], Eric J, DO       Followed by   Cecily Cohen ON 06/08/2023] predniSONE  (DELTASONE ) tablet 20 mg  20 mg Oral Q breakfast [place], Eric J, DO       rosuvastatin  (CRESTOR ) tablet 40 mg  40 mg Oral Daily Claiborne, Claudia, MD   40 mg at 05/29/23 6433   traMADol  (ULTRAM ) tablet 50 mg  50 mg Oral Q12H PRN Kc, Lurlene Salon, MD       vancomycin  (VANCOREADY) IVPB 500 mg/100 mL  500 mg Intravenous Q48H Claiborne, Claudia, MD 100 mL/hr at 05/28/23 0842 500 mg at 05/28/23 2951     Discharge Medications: Please see discharge summary for a list of discharge medications.  Relevant Imaging Results:  Relevant Lab Results:   Additional Information SSN: [id number]  Elspeth Hals, LCSW

## 2023-05-29 NOTE — Progress Notes (Addendum)
 PROGRESS NOTE    Nichole Munoz  UEA:[id number] DOB: 1945/08/18 DOA: 05/25/2023 PCP: [facility]    Brief Narrative:   Nichole Munoz is a 78 y.o. 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  Apparently was ambulatory until 5/19 night, c/o severe leg pain and has been unable to walk since.  Additionally complaining of numbness and tingling in both of her legs from bilateral knees distally.    In the ED, temperature 100.1 F, HR 95, RR 19, BP 142/47, SpO2 97% on room air.  Sodium 13.3, potassium 10.7, plate count 478.  Sodium 139, potassium 4.0, chloride 104, CO2 22, glucose 176, BUN 33, creatinine 2.55.  AST 24, ALT 19, total bilirubin 0.8.  CK2 06.  Lactic acid 0.7.  COVID/influenza/RSV PCR negative.  MRI brain without contrast with no acute intracranial abnormality, noted age-related cerebral atrophy with mild chronic small vessel ischemic disease and small remote Luking or infarcts bilateral basal ganglia.  MRI C-spine no evidence for acute traumatic injury, moderate right paracentral disc extrusion with superior migration C7-T1 with mild/moderate spinal stenosis, additional multilevel cervical spondylosis with mild to moderate diffuse spinal stenosis C3-4 through C6/7, moderate/severe left C4 and bilateral C5/6 foraminal narrowing with severe left C7 foraminal stenosis, loss of normal flow void within the right cerebral artery could reflect slow flow and/or occlusion.  MR T-spine grossly normal appearance of the thoracic spinal cord, degenerative disc bulging facet hypertrophy, moderate spinal stenosis L2/3, L3/4, moderate/severe foraminal narrowing L2/3, L3/4.  Patient was started empirically on vancomycin  and cefepime  given fever of unclear etiology.  [facility] was consulted for admission for  further evaluation management of fever, bilateral lower extremity pain.   Assessment & Plan:   Bilateral lower extremity pain with radiculopathy Ambulatory dysfunction Hx CVA with residual left-sided weakness Patient presenting to ED with worsening bilateral lower extremity pain, with focus of pain at the level of bilateral knees extending distally.  Patient reports radicular symptoms associated with paresthesias.  Denies trauma.  MR brain without acute finding.  MRI T/L-spine with significant multilevel spinal stenosis, foraminal narrowing.  X-rays of bilateral knees with noted arthritic changes, no acute bony abnormality with small joint effusion.  Seen by orthopedics; who believes her symptoms are radicular in nature not related to arthritic changes or concerns for active knee intra-articular pathology. -- Neurosurgery following, awaiting Dr. Charlestine Conquest expert opinion; having trouble obtaining history due to language barrier -- Acetaminophen  650 mg q6h PRN mild pain -- Prednisone  60 mg PO daily x 5d, f/b 40 mg PO daily x 5d, f/b 20 mg PO daily x 5d -- Gabapentin  200 mg p.o. 3 times daily -- Voltaren  gel to bilateral lower extremities 4 times daily -- Oxycodone  2.5-5 mg PO q4h PRN moderate/severe pain -- Will continue to monitor response after initiating prednisone , gabapentin , Voltaren  gel; poor candidate for systemic NSAIDs given renal insufficiency -- Continue PT/OT efforts while inpatient -- Await further recommendations from neurosurgery  Fever, unclear etiology Positive blood culture Patient presenting with a temperature of 100.1 F.  WBC count within normal limits, lactic acid normal.  COVID/influenza/RSV PCR negative.  Urinalysis unrevealing.  Chest x-ray with no active cardiopulmonary disease process.  Bilateral lower extremity vascular duplex ultrasound negative for DVT.  Respiratory viral panel negative.  Blood cultures on admission 5/19 1 out  of 4 positive for GPC's anaerobic bottle  only, suspicious for contamination. -- Cultures x 2 5/19: + 1/5 anaerobic bottle only GPC's, awaiting further identification -- Repeat blood cultures 5/22: No growth less than 12 hours -- Continue vancomycin , pharmacy consult for dosing/monitoring -- Continue cefepime  1 g IV every 24 hours  Acute renal failure on CKD stage IIIb -- Cr 2.55>2.25>2.59>2.89>2.66 -- Avoid nephrotoxins, renally dose all medications  Abnormal MRI-incidental finding: MRI showed Loss of normal flow void within the right vertebral artery, which could reflect slow flow and/or occlusion. Discussed with Neurology, Dr. Cleone Dad by Dr. Bobbetta Burnet; patient leg symptoms are not explained by MRI finding and felt to be incidental  Hypokalemia Repleted  DM2 Hemoglobin A1c 7.0 on 05/05/2023.  Diet controlled at baseline. -- Sensitive SSI for coverage  HTN -- Amlodipine  5 mg p.o. daily -- Continue aspirin  and statin  HLD -- Crestor  40 mg p.o. daily  Asthma -- Breo Ellipta  1 puff daily -- Albuterol  neb as needed  DVT prophylaxis: Place TED hose Start: 05/27/23 1554 heparin  injection 5,000 Units Start: 05/26/23 1400    Code Status: Full Code Family Communication: No family present at bedside this morning, attempted telephone call, family unreachable  Disposition Plan:  Level of care: Telemetry Medical Status is: Inpatient Remains inpatient appropriate because: Prednisone , gabapentin , Voltaren ; pending SNF placement    Consultants:  Orthopedics Neurosurgery, Dr. Michale Age Dr. Bobbetta Burnet discussed with neurology, Dr. Cleone Dad regarding abnormal MRI findings  Procedures:  None  Antimicrobials:  Vancomycin  5/19>> Cefepime  5/19>> Metronidazole  5/19 - 5/19   Subjective: Patient seen examined bedside, lying in bed.  Assisted in interpretation with  Khmer interpreter Nichole Munoz [phone].  Continues to complain of pain to bilateral lower extremities extending from the knee distally that is circumferential to both the anterior and  posterior surface associated with neuropathy.  Denies any injury, trauma or fall.  Denies any issues with bladder or bowel function, no reported saddle anesthesia.  Discussed PT/OT recommendations of SNF placement, states would be amenable for rehab as "wants to do anything to get better".  No family present at bedside.  Discussed starting anti-inflammatory treatment with prednisone , and gabapentin  to see if this assists with her neuropathic pain.  No other Spenser questions, concerns or complaints at this time.  Denies headache, no nausea/vomiting/diarrhea, no shortness of breath, no cough/congestion, no chest pain, no palpitations, no abdominal pain, no chills/night sweats, no focal weakness, no fatigue, no sore throat.  No acute events overnight per nursing staff.  Objective: Vitals:   05/29/23 0223 05/29/23 0406 05/29/23 0727 05/29/23 0813  BP: 123/70 135/63  120/87  Pulse: 89 84 80 91  Resp: 18 18 16    Temp: 98.4 F (36.9 C) 97.8 F (36.6 C)  98.7 F (37.1 C)  TempSrc:    Oral  SpO2: 100% 100%  100%  Weight:      Height:        Intake/Output Summary (Last 24 hours) at 05/29/2023 1127 Last data filed at 05/29/2023 0800 Gross per 24 hour  Intake 1923.61 ml  Output 1050 ml  Net 873.61 ml   Filed Weights   05/25/23 0936  Weight: 45.4 kg    Examination:  Physical Exam: GEN: NAD, alert and oriented x 3, elderly in appearance HEENT: NCAT, PERRL, EOMI, sclera clear, MMM PULM: CTAB w/o wheezes/crackles, normal respiratory effort, on room air CV: RRR w/o M/G/R GI: abd soft, NTND, + BS MSK: no peripheral edema, moves all extremities independently but TTP with soft touch circumferentially  from level of bilateral knees distally towards feet NEURO: CN II-XII intact, no focal deficits, sensation to light touch intact PSYCH: normal mood/affect Integumentary: No concerning rashes/lesions/wounds noted on exposed skin surfaces    Data Reviewed: I have personally reviewed following labs  and imaging studies  CBC: Recent Labs  Lab 05/25/23 1150 05/26/23 0533 05/27/23 0700 05/28/23 0522 05/28/23 2119 05/29/23 0823  WBC 13.3* 11.6* 11.4* 11.0* 10.6* 9.5  NEUTROABS 9.8*  --   --   --   --   --   HGB 10.7* 11.1* 10.0* 10.2* 10.2* 9.7*  HCT 31.7* 32.7* 28.7* 30.7* 29.5* 28.7*  MCV 87.3 87.4 85.2 87.5 86.0 86.2  PLT 209 190 169 175 193 167   Basic Metabolic Panel: Recent Labs  Lab 05/25/23 1138 05/26/23 0533 05/27/23 0700 05/28/23 0522 05/29/23 0823  NA 139 138 136 138 138  K 4.0 3.4* 3.3* 3.9 3.7  CL 104 107 103 108 109  CO2 22 22 22  20* 20*  GLUCOSE 176* 129* 241* 154* 307*  BUN 33* 32* 43* 45* 35*  CREATININE 2.55* 2.25* 2.59* 2.89* 2.66*  CALCIUM  9.4 9.2 8.7* 8.8* 8.5*   GFR: Estimated Creatinine Clearance: 12.3 mL/min (A) (by C-G formula based on SCr of 2.66 mg/dL (H)). Liver Function Tests: Recent Labs  Lab 05/25/23 1138 05/27/23 0700 05/28/23 0522 05/29/23 0823  AST 24 24 22  53*  ALT 15 13 15 30   ALKPHOS 92 88 99 170*  BILITOT 0.8 0.7 0.5 0.2  PROT 8.6* 7.1 7.3 7.1  ALBUMIN 3.8 2.8* 2.7* 2.4*   No results for input(s): "LIPASE", "AMYLASE" in the last 168 hours. No results for input(s): "AMMONIA" in the last 168 hours. Coagulation Profile: No results for input(s): "INR", "PROTIME" in the last 168 hours. Cardiac Enzymes: Recent Labs  Lab 05/25/23 1138  CKTOTAL 206   BNP (last 3 results) No results for input(s): "PROBNP" in the last 8760 hours. HbA1C: No results for input(s): "HGBA1C" in the last 72 hours. CBG: Recent Labs  Lab 05/28/23 1117 05/28/23 1702 05/28/23 2231 05/29/23 0551 05/29/23 1113  GLUCAP 352* 206* 231* 175* 329*   Lipid Profile: No results for input(s): "CHOL", "HDL", "LDLCALC", "TRIG", "CHOLHDL", "LDLDIRECT" in the last 72 hours. Thyroid  Function Tests: No results for input(s): "TSH", "T4TOTAL", "FREET4", "T3FREE", "THYROIDAB" in the last 72 hours. Anemia Panel: No results for input(s): "VITAMINB12",  "FOLATE", "FERRITIN", "TIBC", "IRON", "RETICCTPCT" in the last 72 hours. Sepsis Labs: Recent Labs  Lab 05/25/23 1201 05/28/23 2119  LATICACIDVEN 0.7 1.1    Recent Results (from the past 240 hours)  Blood culture (routine x 2)     Status: None (Preliminary result)   Collection Time: 05/25/23 11:50 AM   Specimen: BLOOD  Result Value Ref Range Status   Specimen Description BLOOD SITE NOT SPECIFIED  Final   Special Requests   Final    BOTTLES DRAWN AEROBIC AND ANAEROBIC Blood Culture results may not be optimal due to an inadequate volume of blood received in culture bottles   Culture   Final    NO GROWTH 4 DAYS Performed at [facility], [address] [place], [place] [address]    Report Status PENDING  Incomplete  Blood culture (routine x 2)     Status: None (Preliminary result)   Collection Time: 05/25/23 11:55 AM   Specimen: BLOOD  Result Value Ref Range Status   Specimen Description BLOOD SITE NOT SPECIFIED  Final   Special Requests   Final  BOTTLES DRAWN AEROBIC AND ANAEROBIC Blood Culture results may not be optimal due to an inadequate volume of blood received in culture bottles   Culture  Setup Time   Final    GRAM POSITIVE RODS ANAEROBIC BOTTLE ONLY CRITICAL RESULT CALLED TO, READ BACK BY AND VERIFIED WITH: V [email or web address],[email or web address]  05/28/23 MK    Culture   Final    GRAM POSITIVE RODS CULTURE REINCUBATED FOR BETTER GROWTH Performed at [facility], [address] [place], [place] [address]    Report Status PENDING  Incomplete  Resp panel by RT-PCR (RSV, Flu A&B, Covid) Anterior Nasal Swab     Status: None   Collection Time: 05/25/23  3:58 PM   Specimen: Anterior Nasal Swab  Result Value Ref Range Status   SARS Coronavirus 2 by RT PCR NEGATIVE NEGATIVE Final   Influenza A by PCR NEGATIVE NEGATIVE Final   Influenza B by PCR NEGATIVE NEGATIVE Final    Comment: (NOTE) The Xpert Xpress SARS-CoV-2/FLU/RSV plus assay is intended as an aid in the diagnosis of  influenza from Nasopharyngeal swab specimens and should not be used as a sole basis for treatment. Nasal washings and aspirates are unacceptable for Xpert Xpress SARS-CoV-2/FLU/RSV testing.  Fact Sheet for Patients: BloggerCourse.com  Fact Sheet for Healthcare Providers: SeriousBroker.it  This test is not yet approved or cleared by the [place]  FDA and has been authorized for detection and/or diagnosis of SARS-CoV-2 by FDA under an Emergency Use Authorization (EUA). This EUA will remain in effect (meaning this test can be used) for the duration of the COVID-19 declaration under Section 564(b)(1) of the Act, 21 U.S.C. section 360bbb-3(b)(1), unless the authorization is terminated or revoked.     Resp Syncytial Virus by PCR NEGATIVE NEGATIVE Final    Comment: (NOTE) Fact Sheet for Patients: BloggerCourse.com  Fact Sheet for Healthcare Providers: SeriousBroker.it  This test is not yet approved or cleared by the [place]  FDA and has been authorized for detection and/or diagnosis of SARS-CoV-2 by FDA under an Emergency Use Authorization (EUA). This EUA will remain in effect (meaning this test can be used) for the duration of the COVID-19 declaration under Section 564(b)(1) of the Act, 21 U.S.C. section 360bbb-3(b)(1), unless the authorization is terminated or revoked.  Performed at [facility] Lab, [address], [place], [place] [address]   Culture, blood (Routine X 2) w Reflex to ID Panel     Status: None (Preliminary result)   Collection Time: 05/28/23  9:19 PM   Specimen: BLOOD RIGHT ARM  Result Value Ref Range Status   Specimen Description BLOOD RIGHT ARM  Final   Special Requests   Final    BOTTLES DRAWN AEROBIC AND ANAEROBIC Blood Culture results may not be optimal due to an inadequate volume of blood received in culture bottles   Culture   Final    NO  GROWTH < 12 HOURS Performed at [facility] Lab, [address] [place], [place] [address]    Report Status PENDING  Incomplete  Culture, blood (Routine X 2) w Reflex to ID Panel     Status: None (Preliminary result)   Collection Time: 05/28/23  9:21 PM   Specimen: BLOOD RIGHT ARM  Result Value Ref Range Status   Specimen Description BLOOD RIGHT ARM  Final   Special Requests   Final    BOTTLES DRAWN AEROBIC AND ANAEROBIC Blood Culture results may not be optimal due to an inadequate volume of blood received in culture bottles  Culture   Final    NO GROWTH < 12 HOURS Performed at [facility], [address], [place], [place] [address]    Report Status PENDING  Incomplete  Respiratory (~20 pathogens) panel by PCR     Status: None   Collection Time: 05/29/23  1:21 AM   Specimen: Nasopharyngeal Swab; Respiratory  Result Value Ref Range Status   Adenovirus NOT DETECTED NOT DETECTED Final   Coronavirus 229E NOT DETECTED NOT DETECTED Final    Comment: (NOTE) The Coronavirus on the Respiratory Panel, DOES NOT test for the novel  Coronavirus (2019 nCoV)    Coronavirus HKU1 NOT DETECTED NOT DETECTED Final   Coronavirus NL63 NOT DETECTED NOT DETECTED Final   Coronavirus OC43 NOT DETECTED NOT DETECTED Final   Metapneumovirus NOT DETECTED NOT DETECTED Final   Rhinovirus / Enterovirus NOT DETECTED NOT DETECTED Final   Influenza A NOT DETECTED NOT DETECTED Final   Influenza B NOT DETECTED NOT DETECTED Final   Parainfluenza Virus 1 NOT DETECTED NOT DETECTED Final   Parainfluenza Virus 2 NOT DETECTED NOT DETECTED Final   Parainfluenza Virus 3 NOT DETECTED NOT DETECTED Final   Parainfluenza Virus 4 NOT DETECTED NOT DETECTED Final   Respiratory Syncytial Virus NOT DETECTED NOT DETECTED Final   Bordetella pertussis NOT DETECTED NOT DETECTED Final   Bordetella Parapertussis NOT DETECTED NOT DETECTED Final   Chlamydophila pneumoniae NOT DETECTED NOT DETECTED Final   Mycoplasma pneumoniae  NOT DETECTED NOT DETECTED Final    Comment: Performed at [facility] Lab, [address] [place], [place] [address]         Radiology Studies: VAS US  ABI WITH/WO TBI Result Date: 05/29/2023  LOWER EXTREMITY DOPPLER STUDY Patient Name:  Nichole Munoz  Date of Exam:   05/28/2023 Medical Rec #: [id number]  Accession #:    [id number] Date of Birth: 1945/12/01   Patient Gender: F Patient Age:   49 years Exam Location:  [facility] Procedure:      VAS US  ABI WITH/WO TBI Referring Phys: --------------------------------------------------------------------------------  Indications: Bilateral leg and foot pain/tenderness to touch from knees down High Risk         Hypertension, Diabetes, no history of smoking, prior CVA Factors:          04/2019. Other Factors: Neuropathy.  Limitations: Today's exam was limited due to involuntary patient movement and              tachycardia. Comparison Study: No prior study on file Performing Technologist: Carleene Chase RVS  Examination Guidelines: A complete evaluation includes at minimum, Doppler waveform signals and systolic blood pressure reading at the level of bilateral brachial, anterior tibial, and posterior tibial arteries, when vessel segments are accessible. Bilateral testing is considered an integral part of a complete examination. Photoelectric Plethysmograph (PPG) waveforms and toe systolic pressure readings are included as required and additional duplex testing as needed. Limited examinations for reoccurring indications may be performed as noted.  ABI Findings: +---------+------------------+-----+-----------+--------+ Right    Rt Pressure (mmHg)IndexWaveform   Comment  +---------+------------------+-----+-----------+--------+ Brachial 166                    triphasic           +---------+------------------+-----+-----------+--------+ PTA      151               0.91 multiphasic         +---------+------------------+-----+-----------+--------+ DP        160  0.96 multiphasic         +---------+------------------+-----+-----------+--------+ Great Toe75                0.45                     +---------+------------------+-----+-----------+--------+ +---------+------------------+-----+-----------+-------------------------------+ Left     Lt Pressure (mmHg)IndexWaveform   Comment                         +---------+------------------+-----+-----------+-------------------------------+ Brachial 154                    triphasic                                  +---------+------------------+-----+-----------+-------------------------------+ PTA      111               0.67 multiphasicpressure may not be accurate                                               secondary to patient movement   +---------+------------------+-----+-----------+-------------------------------+ DP       135               0.81 multiphasic                                +---------+------------------+-----+-----------+-------------------------------+ Great Toe92                0.55                                            +---------+------------------+-----+-----------+-------------------------------+ +-------+-----------+-----------+------------+------------+ ABI/TBIToday's ABIToday's TBIPrevious ABIPrevious TBI +-------+-----------+-----------+------------+------------+ Right  0.96       0.45                                +-------+-----------+-----------+------------+------------+ Left   0.81       0.55                                +-------+-----------+-----------+------------+------------+  Summary: Right: Resting right ankle-brachial index is within normal range. The right toe-brachial index is abnormal. Left: Resting left ankle-brachial index indicates mild left lower extremity arterial disease. The left toe-brachial index is abnormal. *See table(s) above for measurements and observations.  Electronically  signed by Delaney Fearing on 05/29/2023 at 8:19:51 AM.    Final    DG Chest Port 1 View Result Date: 05/28/2023 CLINICAL DATA:  Fever. EXAM: PORTABLE CHEST 1 VIEW COMPARISON:  05/25/2023. FINDINGS: The heart size and mediastinal contours are stable. There is atherosclerotic calcification of the aorta. Chronic elevation of right diaphragm is noted. No consolidation, effusion, or pneumothorax is noted. No acute osseous abnormality is seen. IMPRESSION: No active disease. Electronically Signed   By: Wyvonnia Heimlich M.D.   On: 05/28/2023 21:34   MR Lumbar Spine W Wo Contrast Result Date: 05/27/2023 EXAM: MR Lumbar Spine with and without intravenous contrast. 05/27/2023 06:00:03 PM TECHNIQUE: Multiplanar multisequence MRI of the lumbar spine was  performed with and without the administration of intravenous contrast. COMPARISON: Lumbar spine radiographs 07/05/2016. CLINICAL HISTORY: Brain/CNS neoplasm, monitor; temperature elevated, severe leg pain, difficulty walking. FINDINGS: BONES AND ALIGNMENT: Mild leftward curvature is centered at L2-3. Straightening of the normal lumbar lordosis is stable. At L5 to S1, grade 1 anterolisthesis measuring 9 mm is similar to the prior exam. Type II Modic changes are noted anteriorly at L1-2 and anteriorly on the left at L5 to S1. SPINAL CORD: The conus terminates normally. SOFT TISSUES: No abnormal enhancement is seen of the lumbar spine. No paraspinal mass identified. L1-L2: A leftward disc protrusion results in mild left subarticular narrowing. Moderate foraminal stenosis is worse left than right. L2-L3: A broad-based disc protrusion and moderate facet hypertrophy result in moderate central canal stenosis with subarticular narrowing bilaterally. Severe right and moderate left foraminal stenosis is present. L3-L4: A leftward disc protrusion is present. Moderate facet hypertrophy is noted bilaterally. Moderate left and mild right subarticular stenosis is present. Moderate to severe  foraminal narrowing is present bilaterally, left greater than right. L4-L5: A leftward disc protrusion is present. Moderate left and mild right subarticular narrowing is present. Facet hypertrophy contributes bilaterally. Severe left and moderate right foraminal stenosis is present. A far right annular tear is noted. L5-S1: There is uncovertebral joint arthrosis with a broad-based disc protrusion. Bilateral pars defects result in severe foraminal narrowing bilaterally, left greater than right. The central canal is patent. IMPRESSION: 1. L1-2: Leftward disc protrusion with mild left subarticular narrowing and moderate foraminal stenosis, worse left than right. 2. L2-3: Broad-based disc protrusion and moderate facet hypertrophy resulting in moderate central canal stenosis with subarticular narrowing bilaterally. Severe right and moderate left foraminal stenosis. 3. L3-4: Leftward disc protrusion and moderate facet hypertrophy bilaterally. Moderate left and mild right subarticular stenosis. Moderate to severe foraminal narrowing bilaterally, left greater than right. 4. L4-5: Leftward disc protrusion and facet hypertrophy bilaterally. Moderate left and mild right subarticular narrowing. Severe left and moderate right foraminal stenosis. Far right annular tear. 5. L5-S1: Uncovertebral joint arthrosis, broad-based disc protrusion, and bilateral pars defects resulting in severe foraminal narrowing bilaterally, left greater than right. Central canal is patent. Electronically signed by: Audree Leas MD 05/27/2023 07:02 PM EDT RP Workstation: [id number]   DG Knee 1-2 Views Right Result Date: 05/27/2023 CLINICAL DATA:  Right knee pain. EXAM: RIGHT KNEE - 1-2 VIEW COMPARISON:  None Available. FINDINGS: There is no acute fracture or dislocation. The bones are osteopenic. There is arthritic changes of the right knee with severe narrowing of the medial compartment. There is a small suprapatellar effusion. Mild soft  tissue swelling of the knee. IMPRESSION: 1. No acute fracture or dislocation. 2. Arthritic changes of the right knee and a small suprapatellar effusion. Electronically Signed   By: Angus Bark M.D.   On: 05/27/2023 14:40   DG Knee 1-2 Views Left Result Date: 05/27/2023 CLINICAL DATA:  Pain, knee [id number].  Bilateral knee pain. EXAM: LEFT KNEE - 1-2 VIEW COMPARISON:  Right knee 05/27/2023 FINDINGS: Left knee is located without a fracture. Small suprapatellar joint effusion. Alignment is within normal limits. Mild to moderate joint space narrowing in the medial knee compartment with osteophytosis. IMPRESSION: 1. No acute bone abnormality to the left knee. 2. Small joint effusion. 3. 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 Electronically Signed   By: Elene Griffes M.D.   On: 05/27/2023 14:34        Scheduled Meds:  amLODipine   5 mg Oral Daily   aspirin   81 mg Oral Daily   fluticasone  furoate-vilanterol  1 puff Inhalation Daily   gabapentin   200 mg Oral TID   heparin   5,000 Units Subcutaneous Q8H   insulin  aspart  0-5 Units Subcutaneous QHS   insulin  aspart  0-9 Units Subcutaneous TID WC   mirtazapine   30 mg Oral QHS   predniSONE   60 mg Oral Q breakfast   Followed by   Cecily Cohen ON 06/03/2023] predniSONE   40 mg Oral Q breakfast   Followed by   Cecily Cohen ON 06/08/2023] predniSONE   20 mg Oral Q breakfast   rosuvastatin   40 mg Oral Daily   Continuous Infusions:  sodium chloride  100 mL/hr at 05/29/23 5409   ceFEPime  (MAXIPIME ) IV 1 g (05/28/23 2131)   vancomycin  500 mg (05/28/23 0842)     LOS: 3 days    Time spent: 56 minutes spent on 05/29/2023 caring for this patient face-to-face including chart review, ordering labs/tests, documenting, discussion with nursing staff, consultants, updating family and interview/physical exam    Rema Care [place], DO [facility] Available via Epic secure chat 7am-7pm After these hours, please refer to coverage provider listed on  [email or web address] 05/29/2023, 11:27 AM

## 2023-05-29 NOTE — TOC Progression Note (Addendum)
 Transition of Care [facility]) - Progression Note    Patient Details  Name: Nichole Munoz MRN: [id number] Date of Birth: 1945-10-02  Transition of Care [facility]) CM/SW Contact  Elspeth Hals, LCSW Phone Number: 05/29/2023, 8:35 AM  Clinical Narrative:  Unable to reach daughter or son, more messages left/sent.    0900: CSW spoke with MD.  He talked with pt this AM with interpretor and she is understanding of SNF recommendation and agreeable to pursue this.  1100: Referral sent out in hub for SNF  Expected Discharge Plan:  (TBD) Barriers to Discharge: Continued Medical Work up, Other (must enter comment) (unclear DC plan at this time, attempting to reach family members)  Expected Discharge Plan and Services In-house Referral: Clinical Social Work     Living arrangements for the past 2 months: Single Family Home                                       Social Determinants of Health (SDOH) Interventions SDOH Screenings   Food Insecurity: No Food Insecurity (05/26/2023)  Housing: Low Risk  (05/26/2023)  Transportation Needs: No Transportation Needs (05/26/2023)  Utilities: Not At Risk (05/26/2023)  Financial Resource Strain: Not on File (04/25/2021)   Received from [place], [place]  Physical Activity: Not on File (04/25/2021)   Received from [place], [place]  Social Connections: Moderately Isolated (05/26/2023)  Stress: Not on File (04/25/2021)   Received from [facility], [place]  Tobacco Use: Low Risk  (05/25/2023)    Readmission Risk Interventions     No data to display

## 2023-05-29 NOTE — Plan of Care (Signed)

## 2023-05-29 NOTE — Progress Notes (Signed)
 Date and time results received: 05/29/23 2311  Reported by: Alba Ally, Lab Reported to: Marietta Shorter, RN  Test: Glucose (verification for high CBG) Critical Value: 696  Name of Provider Notified: MD notified and aware.  Orders Received? Or Actions Taken?: Novolog  20 units given, recheck x3 hrs per MD

## 2023-05-29 NOTE — Progress Notes (Signed)
 This patient's son and daughter came to visit her this afternoon.  The son said he would be back tomorrow. Here are phone numbers they gave me  Tylena Prisk ( son) [phone] Zayli Villafuerte ( daughter) [phone]  The son confirmed that the patient lives with him and the daughter in an apartment in [place].

## 2023-05-29 NOTE — Plan of Care (Signed)
  Problem: Health Behavior/Discharge Planning: Goal: Ability to manage health-related needs will improve Outcome: Progressing   Problem: Activity: Goal: Risk for activity intolerance will decrease Outcome: Progressing   Problem: Nutrition: Goal: Adequate nutrition will be maintained Outcome: Progressing   Problem: Elimination: Goal: Will not experience complications related to urinary retention Outcome: Progressing   Problem: Pain Managment: Goal: General experience of comfort will improve and/or be controlled Outcome: Progressing
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
## 2023-05-30 DIAGNOSIS — M79604 Pain in right leg: Secondary | ICD-10-CM | POA: Diagnosis not present

## 2023-05-30 DIAGNOSIS — M79605 Pain in left leg: Secondary | ICD-10-CM | POA: Diagnosis not present

## 2023-05-30 LAB — GLUCOSE, CAPILLARY
Glucose-Capillary: 595 mg/dL (ref 70–99)
Glucose-Capillary: 498 mg/dL — ABNORMAL HIGH (ref 70–99)
Glucose-Capillary: 372 mg/dL — ABNORMAL HIGH (ref 70–99)
Glucose-Capillary: 301 mg/dL — ABNORMAL HIGH (ref 70–99)
Glucose-Capillary: 335 mg/dL — ABNORMAL HIGH (ref 70–99)
Glucose-Capillary: 329 mg/dL — ABNORMAL HIGH (ref 70–99)
Glucose-Capillary: 346 mg/dL — ABNORMAL HIGH (ref 70–99)
Glucose-Capillary: 405 mg/dL — ABNORMAL HIGH (ref 70–99)

## 2023-05-30 LAB — CULTURE, BLOOD (ROUTINE X 2): Culture: NO GROWTH

## 2023-05-30 LAB — COMPREHENSIVE METABOLIC PANEL WITH GFR
ALT: 30 U/L (ref 0–44)
AST: 35 U/L (ref 15–41)
Albumin: 2.3 g/dL — ABNORMAL LOW (ref 3.5–5.0)
Alkaline Phosphatase: 164 U/L — ABNORMAL HIGH (ref 38–126)
Anion gap: 9 (ref 5–15)
BUN: 50 mg/dL — ABNORMAL HIGH (ref 8–23)
CO2: 20 mmol/L — ABNORMAL LOW (ref 22–32)
Calcium: 8.7 mg/dL — ABNORMAL LOW (ref 8.9–10.3)
Chloride: 105 mmol/L (ref 98–111)
Creatinine, Ser: 2.81 mg/dL — ABNORMAL HIGH (ref 0.44–1.00)
GFR, Estimated: 17 mL/min — ABNORMAL LOW (ref >60)
Glucose, Bld: 474 mg/dL — ABNORMAL HIGH (ref 70–99)
Potassium: 3.8 mmol/L (ref 3.5–5.1)
Sodium: 134 mmol/L — ABNORMAL LOW (ref 135–145)
Total Bilirubin: 0.4 mg/dL (ref 0.0–1.2)
Total Protein: 7 g/dL (ref 6.5–8.1)

## 2023-05-30 LAB — CBC
HCT: 27.3 % — ABNORMAL LOW (ref 36.0–46.0)
Hemoglobin: 9.3 g/dL — ABNORMAL LOW (ref 12.0–15.0)
MCH: 29.5 pg (ref 26.0–34.0)
MCHC: 34.1 g/dL (ref 30.0–36.0)
MCV: 86.7 fL (ref 80.0–100.0)
Platelets: 188 10*3/uL (ref 150–400)
RBC: 3.15 MIL/uL — ABNORMAL LOW (ref 3.87–5.11)
RDW: 11.7 % (ref 11.5–15.5)
WBC: 7 10*3/uL (ref 4.0–10.5)
nRBC: 0 % (ref 0.0–0.2)

## 2023-05-30 MED ORDER — SENNOSIDES-DOCUSATE SODIUM 8.6-50 MG PO TABS
1.0000 | ORAL_TABLET | Freq: Two times a day (BID) | ORAL | Status: DC
  Administered 2023-05-30 – 2023-06-03 (×4): 1 via ORAL
  Filled 2023-05-30 (×5): qty 1

## 2023-05-30 MED ORDER — BISACODYL 5 MG PO TBEC
5.0000 mg | DELAYED_RELEASE_TABLET | Freq: Every day | ORAL | Status: DC | PRN
  Administered 2023-05-30: 5 mg via ORAL
  Filled 2023-05-30: qty 1

## 2023-05-30 MED ORDER — INSULIN GLARGINE-YFGN 100 UNIT/ML ~~LOC~~ SOLN
15.0000 [IU] | Freq: Every day | SUBCUTANEOUS | Status: DC
  Administered 2023-05-30: 15 [IU] via SUBCUTANEOUS
  Filled 2023-05-30 (×2): qty 0.15

## 2023-05-30 MED ORDER — INSULIN ASPART 100 UNIT/ML IJ SOLN
0.0000 [IU] | Freq: Three times a day (TID) | INTRAMUSCULAR | Status: DC
  Administered 2023-05-30 (×3): 15 [IU] via SUBCUTANEOUS
  Administered 2023-05-31: 7 [IU] via SUBCUTANEOUS
  Administered 2023-05-31: 20 [IU] via SUBCUTANEOUS
  Administered 2023-05-31: 15 [IU] via SUBCUTANEOUS
  Administered 2023-06-01: 7 [IU] via SUBCUTANEOUS
  Administered 2023-06-01: 20 [IU] via SUBCUTANEOUS
  Administered 2023-06-01: 15 [IU] via SUBCUTANEOUS
  Administered 2023-06-02 (×2): 11 [IU] via SUBCUTANEOUS
  Administered 2023-06-03: 4 [IU] via SUBCUTANEOUS

## 2023-05-30 MED ORDER — INSULIN ASPART 100 UNIT/ML IJ SOLN
20.0000 [IU] | Freq: Once | INTRAMUSCULAR | Status: AC
  Administered 2023-05-30: 20 [IU] via SUBCUTANEOUS

## 2023-05-30 MED ORDER — POLYETHYLENE GLYCOL 3350 17 G PO PACK
17.0000 g | PACK | Freq: Every day | ORAL | Status: DC
  Administered 2023-05-30 – 2023-06-03 (×3): 17 g via ORAL
  Filled 2023-05-30 (×3): qty 1

## 2023-05-30 MED ORDER — BISACODYL 10 MG RE SUPP: 10.0000 mg | Freq: Every day | RECTAL | Status: DC | PRN

## 2023-05-30 NOTE — Plan of Care (Signed)
  Problem: Activity: Goal: Risk for activity intolerance will decrease Outcome: Progressing   Problem: Elimination: Goal: Will not experience complications related to bowel motility Outcome: Progressing   

## 2023-05-30 NOTE — Progress Notes (Signed)
 PROGRESS NOTE    Nichole Munoz  ZOX:[id number] DOB: 13-Jan-1945 DOA: 05/25/2023 PCP: [facility]    Brief Narrative:   Nichole Munoz is a 78 y.o. 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  Apparently was ambulatory until 5/19 night, c/o severe leg pain and has been unable to walk since.  Additionally complaining of numbness and tingling in both of her legs from bilateral knees distally.    In the ED, temperature 100.1 F, HR 95, RR 19, BP 142/47, SpO2 97% on room air.  Sodium 13.3, potassium 10.7, plate count 811.  Sodium 139, potassium 4.0, chloride 104, CO2 22, glucose 176, BUN 33, creatinine 2.55.  AST 24, ALT 19, total bilirubin 0.8.  CK2 06.  Lactic acid 0.7.  COVID/influenza/RSV PCR negative.  MRI brain without contrast with no acute intracranial abnormality, noted age-related cerebral atrophy with mild chronic small vessel ischemic disease and small remote Luking or infarcts bilateral basal ganglia.  MRI C-spine no evidence for acute traumatic injury, moderate right paracentral disc extrusion with superior migration C7-T1 with mild/moderate spinal stenosis, additional multilevel cervical spondylosis with mild to moderate diffuse spinal stenosis C3-4 through C6/7, moderate/severe left C4 and bilateral C5/6 foraminal narrowing with severe left C7 foraminal stenosis, loss of normal flow void within the right cerebral artery could reflect slow flow and/or occlusion.  MR T-spine grossly normal appearance of the thoracic spinal cord, degenerative disc bulging facet hypertrophy, moderate spinal stenosis L2/3, L3/4, moderate/severe foraminal narrowing L2/3, L3/4.  Patient was started empirically on vancomycin  and cefepime  given fever of unclear etiology.  [facility] was consulted for admission for  further evaluation management of fever, bilateral lower extremity pain.   Assessment & Plan:   Bilateral lower extremity pain with radiculopathy Ambulatory dysfunction Hx CVA with residual left-sided weakness Patient presenting to ED with worsening bilateral lower extremity pain, with focus of pain at the level of bilateral knees extending distally.  Patient reports radicular symptoms associated with paresthesias.  Denies trauma.  MR brain without acute finding.  MRI T/L-spine with significant multilevel spinal stenosis, foraminal narrowing.  X-rays of bilateral knees with noted arthritic changes, no acute bony abnormality with small joint effusion.  Seen by orthopedics; who believes her symptoms are radicular in nature not related to arthritic changes or concerns for active knee intra-articular pathology. -- Neurosurgery following, awaiting Dr. Charlestine Conquest expert opinion; having trouble obtaining history due to language barrier -- Acetaminophen  650 mg q6h PRN mild pain -- Prednisone  60 mg PO daily x 5d, f/b 40 mg PO daily x 5d, f/b 20 mg PO daily x 5d -- Gabapentin  200 mg p.o. 3 times daily -- Voltaren  gel to bilateral lower extremities 4 times daily -- Oxycodone  2.5-5 mg PO q4h PRN moderate/severe pain -- Will continue to monitor response after initiating prednisone , gabapentin , Voltaren  gel; poor candidate for systemic NSAIDs given renal insufficiency -- Continue PT/OT efforts while inpatient -- Await further recommendations from neurosurgery  Fever, unclear etiology Positive blood culture Patient presenting with a temperature of 100.1 F.  WBC count within normal limits, lactic acid normal.  COVID/influenza/RSV PCR negative.  Urinalysis unrevealing.  Chest x-ray with no active cardiopulmonary disease process.  Bilateral lower extremity vascular duplex ultrasound negative for DVT.  Respiratory viral panel negative.  Blood cultures on admission 5/19 1 out  of 4 positive for GPC's anaerobic bottle  only, suspicious for contamination. -- Cultures x 2 5/19: + 1/5 anaerobic bottle only GPC's, awaiting further identification -- Repeat blood cultures 5/22: No growth less than 12 hours -- Continue vancomycin , pharmacy consult for dosing/monitoring -- Continue cefepime  1 g IV every 24 hours  Acute renal failure on CKD stage IIIb -- Cr 2.55>2.25>2.59>2.89>2.66>2.81 -- Avoid nephrotoxins, renally dose all medications  Abnormal MRI-incidental finding: MRI showed Loss of normal flow void within the right vertebral artery, which could reflect slow flow and/or occlusion. Discussed with Neurology, Dr. Cleone Dad by Dr. Bobbetta Burnet; patient leg symptoms are not explained by MRI finding and felt to be incidental  Hypokalemia Repleted  DM2 Hemoglobin A1c 7.0 on 05/05/2023.  Diet controlled at baseline. -- Start Semglee  15u [place] daily -- resistant SSI for coverage 2/2 steroids as above -- CBGs qAC/HS -- Sensitive SSI for coverage  HTN -- Amlodipine  5 mg p.o. daily -- Continue aspirin  and statin  HLD -- Crestor  40 mg p.o. daily  Asthma -- Breo Ellipta  1 puff daily -- Albuterol  neb as needed  DVT prophylaxis: Place TED hose Start: 05/27/23 1554 heparin  injection 5,000 Units Start: 05/26/23 1400    Code Status: Full Code Family Communication: No family present at bedside this morning  Disposition Plan:  Level of care: Telemetry Medical Status is: Inpatient Remains inpatient appropriate because: Prednisone , gabapentin , Voltaren ; pending SNF placement    Consultants:  Orthopedics Neurosurgery, Dr. Michale Age Dr. Bobbetta Burnet discussed with neurology, Dr. Cleone Dad regarding abnormal MRI findings  Procedures:  None  Antimicrobials:  Vancomycin  5/19>> Cefepime  5/19>> Metronidazole  5/19 - 5/19   Subjective: Patient seen examined bedside, lying in bed.  Unfortunately no interpretation available via video interpreter today as no availability (Khmer).  Patient reports pain improved, due to language barrier  unable to obtain any further ROS.  No family present at bedside.  No acute events overnight per nursing staff.  Objective: Vitals:   05/29/23 2054 05/30/23 0521 05/30/23 0748 05/30/23 1311  BP: 115/83 118/61 120/67 122/74  Pulse: 90 77 85 83  Resp: 16 16 17 17   Temp: 97.9 F (36.6 C) 97.7 F (36.5 C) 98.1 F (36.7 C) 98.4 F (36.9 C)  TempSrc:  Oral Oral Oral  SpO2: 99% 99% 100%   Weight:      Height:        Intake/Output Summary (Last 24 hours) at 05/30/2023 1359 Last data filed at 05/30/2023 1300 Gross per 24 hour  Intake 480 ml  Output --  Net 480 ml   Filed Weights   05/25/23 0936  Weight: 45.4 kg    Examination:  Physical Exam: GEN: NAD, alert and oriented x 3, elderly in appearance HEENT: NCAT, PERRL, EOMI, sclera clear, MMM PULM: CTAB w/o wheezes/crackles, normal respiratory effort, on room air CV: RRR w/o M/G/R GI: abd soft, NTND, + BS MSK: no peripheral edema, moves all extremities independently but TTP with soft touch circumferentially from level of bilateral knees distally towards feet NEURO: CN II-XII intact, no focal deficits, sensation to light touch intact PSYCH: normal mood/affect Integumentary: No concerning rashes/lesions/wounds noted on exposed skin surfaces    Data Reviewed: I have personally reviewed following labs and imaging studies  CBC: Recent Labs  Lab 05/25/23 1150 05/26/23 0533 05/27/23 0700 05/28/23 0522 05/28/23 2119 05/29/23 0823 05/30/23 0409  WBC 13.3*   < > 11.4* 11.0* 10.6* 9.5 7.0  NEUTROABS 9.8*  --   --   --   --   --   --  HGB 10.7*   < > 10.0* 10.2* 10.2* 9.7* 9.3*  HCT 31.7*   < > 28.7* 30.7* 29.5* 28.7* 27.3*  MCV 87.3   < > 85.2 87.5 86.0 86.2 86.7  PLT 209   < > 169 175 193 167 188   < > = values in this interval not displayed.   Basic Metabolic Panel: Recent Labs  Lab 05/26/23 0533 05/27/23 0700 05/28/23 0522 05/29/23 0823 05/29/23 2221 05/30/23 0409  NA 138 136 138 138  --  134*  K 3.4* 3.3* 3.9  3.7  --  3.8  CL 107 103 108 109  --  105  CO2 22 22 20* 20*  --  20*  GLUCOSE 129* 241* 154* 307* 696* 474*  BUN 32* 43* 45* 35*  --  50*  CREATININE 2.25* 2.59* 2.89* 2.66*  --  2.81*  CALCIUM  9.2 8.7* 8.8* 8.5*  --  8.7*   GFR: Estimated Creatinine Clearance: 11.6 mL/min (A) (by C-G formula based on SCr of 2.81 mg/dL (H)). Liver Function Tests: Recent Labs  Lab 05/25/23 1138 05/27/23 0700 05/28/23 0522 05/29/23 0823 05/30/23 0409  AST 24 24 22  53* 35  ALT 15 13 15 30 30   ALKPHOS 92 88 99 170* 164*  BILITOT 0.8 0.7 0.5 0.2 0.4  PROT 8.6* 7.1 7.3 7.1 7.0  ALBUMIN 3.8 2.8* 2.7* 2.4* 2.3*   No results for input(s): "LIPASE", "AMYLASE" in the last 168 hours. No results for input(s): "AMMONIA" in the last 168 hours. Coagulation Profile: No results for input(s): "INR", "PROTIME" in the last 168 hours. Cardiac Enzymes: Recent Labs  Lab 05/25/23 1138  CKTOTAL 206   BNP (last 3 results) No results for input(s): "PROBNP" in the last 8760 hours. HbA1C: No results for input(s): "HGBA1C" in the last 72 hours. CBG: Recent Labs  Lab 05/30/23 0205 05/30/23 0528 05/30/23 0647 05/30/23 0750 05/30/23 1107  GLUCAP 498* 372* 301* 335* 329*   Lipid Profile: No results for input(s): "CHOL", "HDL", "LDLCALC", "TRIG", "CHOLHDL", "LDLDIRECT" in the last 72 hours. Thyroid  Function Tests: No results for input(s): "TSH", "T4TOTAL", "FREET4", "T3FREE", "THYROIDAB" in the last 72 hours. Anemia Panel: No results for input(s): "VITAMINB12", "FOLATE", "FERRITIN", "TIBC", "IRON", "RETICCTPCT" in the last 72 hours. Sepsis Labs: Recent Labs  Lab 05/25/23 1201 05/28/23 2119  LATICACIDVEN 0.7 1.1    Recent Results (from the past 240 hours)  Blood culture (routine x 2)     Status: None   Collection Time: 05/25/23 11:50 AM   Specimen: BLOOD  Result Value Ref Range Status   Specimen Description BLOOD SITE NOT SPECIFIED  Final   Special Requests   Final    BOTTLES DRAWN AEROBIC AND  ANAEROBIC Blood Culture results may not be optimal due to an inadequate volume of blood received in culture bottles   Culture   Final    NO GROWTH 5 DAYS Performed at [facility], [address] [place], [place] [address]    Report Status 05/30/2023 FINAL  Final  Blood culture (routine x 2)     Status: None (Preliminary result)   Collection Time: 05/25/23 11:55 AM   Specimen: BLOOD  Result Value Ref Range Status   Specimen Description BLOOD SITE NOT SPECIFIED  Final   Special Requests   Final    BOTTLES DRAWN AEROBIC AND ANAEROBIC Blood Culture results may not be optimal due to an inadequate volume of blood received in culture bottles   Culture  Setup Time   Final  GRAM POSITIVE RODS ANAEROBIC BOTTLE ONLY CRITICAL RESULT CALLED TO, READ BACK BY AND VERIFIED WITH: V [email or web address],[email or web address]  05/28/23 MK    Culture   Final    GRAM POSITIVE RODS CULTURE REINCUBATED FOR BETTER GROWTH Performed at [facility], [address] [place], [place] [address]    Report Status PENDING  Incomplete  Resp panel by RT-PCR (RSV, Flu A&B, Covid) Anterior Nasal Swab     Status: None   Collection Time: 05/25/23  3:58 PM   Specimen: Anterior Nasal Swab  Result Value Ref Range Status   SARS Coronavirus 2 by RT PCR NEGATIVE NEGATIVE Final   Influenza A by PCR NEGATIVE NEGATIVE Final   Influenza B by PCR NEGATIVE NEGATIVE Final    Comment: (NOTE) The Xpert Xpress SARS-CoV-2/FLU/RSV plus assay is intended as an aid in the diagnosis of influenza from Nasopharyngeal swab specimens and should not be used as a sole basis for treatment. Nasal washings and aspirates are unacceptable for Xpert Xpress SARS-CoV-2/FLU/RSV testing.  Fact Sheet for Patients: BloggerCourse.com  Fact Sheet for Healthcare Providers: SeriousBroker.it  This test is not yet approved or cleared by the [place]  FDA and has been authorized for detection and/or diagnosis of  SARS-CoV-2 by FDA under an Emergency Use Authorization (EUA). This EUA will remain in effect (meaning this test can be used) for the duration of the COVID-19 declaration under Section 564(b)(1) of the Act, 21 U.S.C. section 360bbb-3(b)(1), unless the authorization is terminated or revoked.     Resp Syncytial Virus by PCR NEGATIVE NEGATIVE Final    Comment: (NOTE) Fact Sheet for Patients: BloggerCourse.com  Fact Sheet for Healthcare Providers: SeriousBroker.it  This test is not yet approved or cleared by the [place]  FDA and has been authorized for detection and/or diagnosis of SARS-CoV-2 by FDA under an Emergency Use Authorization (EUA). This EUA will remain in effect (meaning this test can be used) for the duration of the COVID-19 declaration under Section 564(b)(1) of the Act, 21 U.S.C. section 360bbb-3(b)(1), unless the authorization is terminated or revoked.  Performed at [facility], [address], [place], [place] [address]   Culture, blood (Routine X 2) w Reflex to ID Panel     Status: None (Preliminary result)   Collection Time: 05/28/23  9:19 PM   Specimen: BLOOD RIGHT ARM  Result Value Ref Range Status   Specimen Description BLOOD RIGHT ARM  Final   Special Requests   Final    BOTTLES DRAWN AEROBIC AND ANAEROBIC Blood Culture results may not be optimal due to an inadequate volume of blood received in culture bottles   Culture   Final    NO GROWTH 2 DAYS Performed at [facility], [address], [place], [place] [address]    Report Status PENDING  Incomplete  Culture, blood (Routine X 2) w Reflex to ID Panel     Status: None (Preliminary result)   Collection Time: 05/28/23  9:21 PM   Specimen: BLOOD RIGHT ARM  Result Value Ref Range Status   Specimen Description BLOOD RIGHT ARM  Final   Special Requests   Final    BOTTLES DRAWN AEROBIC AND ANAEROBIC Blood Culture results may not be optimal due  to an inadequate volume of blood received in culture bottles   Culture   Final    NO GROWTH 2 DAYS Performed at [facility], [address] [place], [place] [address]    Report Status PENDING  Incomplete  Respiratory (~  20 pathogens) panel by PCR     Status: None   Collection Time: 05/29/23  1:21 AM   Specimen: Nasopharyngeal Swab; Respiratory  Result Value Ref Range Status   Adenovirus NOT DETECTED NOT DETECTED Final   Coronavirus 229E NOT DETECTED NOT DETECTED Final    Comment: (NOTE) The Coronavirus on the Respiratory Panel, DOES NOT test for the novel  Coronavirus (2019 nCoV)    Coronavirus HKU1 NOT DETECTED NOT DETECTED Final   Coronavirus NL63 NOT DETECTED NOT DETECTED Final   Coronavirus OC43 NOT DETECTED NOT DETECTED Final   Metapneumovirus NOT DETECTED NOT DETECTED Final   Rhinovirus / Enterovirus NOT DETECTED NOT DETECTED Final   Influenza A NOT DETECTED NOT DETECTED Final   Influenza B NOT DETECTED NOT DETECTED Final   Parainfluenza Virus 1 NOT DETECTED NOT DETECTED Final   Parainfluenza Virus 2 NOT DETECTED NOT DETECTED Final   Parainfluenza Virus 3 NOT DETECTED NOT DETECTED Final   Parainfluenza Virus 4 NOT DETECTED NOT DETECTED Final   Respiratory Syncytial Virus NOT DETECTED NOT DETECTED Final   Bordetella pertussis NOT DETECTED NOT DETECTED Final   Bordetella Parapertussis NOT DETECTED NOT DETECTED Final   Chlamydophila pneumoniae NOT DETECTED NOT DETECTED Final   Mycoplasma pneumoniae NOT DETECTED NOT DETECTED Final    Comment: Performed at [facility], [address], [place], [place] [address]         Radiology Studies: VAS US  ABI WITH/WO TBI Result Date: 05/29/2023  LOWER EXTREMITY DOPPLER STUDY Patient Name:  Davina Clenney  Date of Exam:   05/28/2023 Medical Rec #: [id number]  Accession #:    [id number] Date of Birth: 06/25/1945   Patient Gender: F Patient Age:   1 years Exam Location:  [facility] Procedure:      VAS US  ABI WITH/WO TBI  Referring Phys: --------------------------------------------------------------------------------  Indications: Bilateral leg and foot pain/tenderness to touch from knees down High Risk         Hypertension, Diabetes, no history of smoking, prior CVA Factors:          04/2019. Other Factors: Neuropathy.  Limitations: Today's exam was limited due to involuntary patient movement and              tachycardia. Comparison Study: No prior study on file Performing Technologist: Carleene Chase RVS  Examination Guidelines: A complete evaluation includes at minimum, Doppler waveform signals and systolic blood pressure reading at the level of bilateral brachial, anterior tibial, and posterior tibial arteries, when vessel segments are accessible. Bilateral testing is considered an integral part of a complete examination. Photoelectric Plethysmograph (PPG) waveforms and toe systolic pressure readings are included as required and additional duplex testing as needed. Limited examinations for reoccurring indications may be performed as noted.  ABI Findings: +---------+------------------+-----+-----------+--------+ Right    Rt Pressure (mmHg)IndexWaveform   Comment  +---------+------------------+-----+-----------+--------+ Brachial 166                    triphasic           +---------+------------------+-----+-----------+--------+ PTA      151               0.91 multiphasic         +---------+------------------+-----+-----------+--------+ DP       160               0.96 multiphasic         +---------+------------------+-----+-----------+--------+ Cosette Dinning  0.45                     +---------+------------------+-----+-----------+--------+ +---------+------------------+-----+-----------+-------------------------------+ Left     Lt Pressure (mmHg)IndexWaveform   Comment                         +---------+------------------+-----+-----------+-------------------------------+ Brachial  154                    triphasic                                  +---------+------------------+-----+-----------+-------------------------------+ PTA      111               0.67 multiphasicpressure may not be accurate                                               secondary to patient movement   +---------+------------------+-----+-----------+-------------------------------+ DP       135               0.81 multiphasic                                +---------+------------------+-----+-----------+-------------------------------+ Great Toe92                0.55                                            +---------+------------------+-----+-----------+-------------------------------+ +-------+-----------+-----------+------------+------------+ ABI/TBIToday's ABIToday's TBIPrevious ABIPrevious TBI +-------+-----------+-----------+------------+------------+ Right  0.96       0.45                                +-------+-----------+-----------+------------+------------+ Left   0.81       0.55                                +-------+-----------+-----------+------------+------------+  Summary: Right: Resting right ankle-brachial index is within normal range. The right toe-brachial index is abnormal. Left: Resting left ankle-brachial index indicates mild left lower extremity arterial disease. The left toe-brachial index is abnormal. *See table(s) above for measurements and observations.  Electronically signed by Delaney Fearing on 05/29/2023 at 8:19:51 AM.    Final    DG Chest Port 1 View Result Date: 05/28/2023 CLINICAL DATA:  Fever. EXAM: PORTABLE CHEST 1 VIEW COMPARISON:  05/25/2023. FINDINGS: The heart size and mediastinal contours are stable. There is atherosclerotic calcification of the aorta. Chronic elevation of right diaphragm is noted. No consolidation, effusion, or pneumothorax is noted. No acute osseous abnormality is seen. IMPRESSION: No active disease.  Electronically Signed   By: Wyvonnia Heimlich M.D.   On: 05/28/2023 21:34        Scheduled Meds:  amLODipine   5 mg Oral Daily   aspirin   81 mg Oral Daily   diclofenac  Sodium  4 g Topical QID   fluticasone  furoate-vilanterol  1 puff Inhalation Daily   gabapentin   200 mg Oral TID   heparin   5,000 Units Subcutaneous Q8H  insulin  aspart  0-20 Units Subcutaneous TID WC   insulin  aspart  0-5 Units Subcutaneous QHS   insulin  glargine-yfgn  15 Units Subcutaneous Daily   mirtazapine   30 mg Oral QHS   predniSONE   60 mg Oral Q breakfast   Followed by   Cecily Cohen ON 06/03/2023] predniSONE   40 mg Oral Q breakfast   Followed by   Cecily Cohen ON 06/08/2023] predniSONE   20 mg Oral Q breakfast   rosuvastatin   40 mg Oral Daily   Continuous Infusions:  ceFEPime  (MAXIPIME ) IV Stopped (05/29/23 2240)   vancomycin  500 mg (05/30/23 0831)     LOS: 4 days    Time spent: 47 minutes spent on 05/30/2023 caring for this patient face-to-face including chart review, ordering labs/tests, documenting, discussion with nursing staff, consultants, updating family and interview/physical exam    Rema [facility] [place], DO [facility] Available via Epic secure chat 7am-7pm After these hours, please refer to coverage provider listed on [email or web address] 05/30/2023, 1:59 PM

## 2023-05-30 NOTE — Plan of Care (Signed)
   Problem: Education: Goal: Knowledge of General Education information will improve Description: Including pain rating scale, medication(s)/side effects and non-pharmacologic comfort measures Outcome: Progressing   Problem: Clinical Measurements: Goal: Ability to maintain clinical measurements within normal limits will improve Outcome: Progressing   Problem: Activity: Goal: Risk for activity intolerance will decrease Outcome: Progressing   Problem: Nutrition: Goal: Adequate nutrition will be maintained Outcome: Progressing   Problem: Pain Managment: Goal: General experience of comfort will improve and/or be controlled Outcome: Progressing   Problem: Safety: Goal: Ability to remain free from injury will improve Outcome: Progressing

## 2023-05-31 DIAGNOSIS — M79604 Pain in right leg: Secondary | ICD-10-CM | POA: Diagnosis not present

## 2023-05-31 DIAGNOSIS — M79605 Pain in left leg: Secondary | ICD-10-CM | POA: Diagnosis not present

## 2023-05-31 LAB — BASIC METABOLIC PANEL WITH GFR
Anion gap: 9 (ref 5–15)
BUN: 54 mg/dL — ABNORMAL HIGH (ref 8–23)
CO2: 20 mmol/L — ABNORMAL LOW (ref 22–32)
Calcium: 9.2 mg/dL (ref 8.9–10.3)
Chloride: 109 mmol/L (ref 98–111)
Creatinine, Ser: 2.39 mg/dL — ABNORMAL HIGH (ref 0.44–1.00)
GFR, Estimated: 20 mL/min — ABNORMAL LOW (ref >60)
Glucose, Bld: 308 mg/dL — ABNORMAL HIGH (ref 70–99)
Potassium: 4.3 mmol/L (ref 3.5–5.1)
Sodium: 138 mmol/L (ref 135–145)

## 2023-05-31 LAB — GLUCOSE, CAPILLARY
Glucose-Capillary: 316 mg/dL — ABNORMAL HIGH (ref 70–99)
Glucose-Capillary: 398 mg/dL — ABNORMAL HIGH (ref 70–99)
Glucose-Capillary: 379 mg/dL — ABNORMAL HIGH (ref 70–99)
Glucose-Capillary: 205 mg/dL — ABNORMAL HIGH (ref 70–99)
Glucose-Capillary: 382 mg/dL — ABNORMAL HIGH (ref 70–99)

## 2023-05-31 MED ORDER — INSULIN ASPART 100 UNIT/ML IJ SOLN
5.0000 [IU] | Freq: Three times a day (TID) | INTRAMUSCULAR | Status: DC
  Administered 2023-05-31 (×3): 5 [IU] via SUBCUTANEOUS

## 2023-05-31 MED ORDER — INSULIN GLARGINE-YFGN 100 UNIT/ML ~~LOC~~ SOLN
25.0000 [IU] | Freq: Every day | SUBCUTANEOUS | Status: DC
  Administered 2023-05-31: 25 [IU] via SUBCUTANEOUS
  Filled 2023-05-31 (×2): qty 0.25

## 2023-05-31 NOTE — Progress Notes (Signed)
 patient had 3 beat run of non sustained V Tach she stated she had some chest pressure and hard to catch her breath BP 141/72 HR 82 98% RA  [facility] Laurence Pons NP notified via chat.

## 2023-05-31 NOTE — Progress Notes (Signed)
 PROGRESS NOTE    Nichole Munoz  ZOX:[id number] DOB: 05/16/1945 DOA: 05/25/2023 PCP: [facility]    Brief Narrative:   Nichole Munoz is a 78 y.o. 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  Apparently was ambulatory until 5/19 night, c/o severe leg pain and has been unable to walk since.  Additionally complaining of numbness and tingling in both of her legs from bilateral knees distally.    In the ED, temperature 100.1 F, HR 95, RR 19, BP 142/47, SpO2 97% on room air.  Sodium 13.3, potassium 10.7, plate count 811.  Sodium 139, potassium 4.0, chloride 104, CO2 22, glucose 176, BUN 33, creatinine 2.55.  AST 24, ALT 19, total bilirubin 0.8.  CK2 06.  Lactic acid 0.7.  COVID/influenza/RSV PCR negative.  MRI brain without contrast with no acute intracranial abnormality, noted age-related cerebral atrophy with mild chronic small vessel ischemic disease and small remote Luking or infarcts bilateral basal ganglia.  MRI C-spine no evidence for acute traumatic injury, moderate right paracentral disc extrusion with superior migration C7-T1 with mild/moderate spinal stenosis, additional multilevel cervical spondylosis with mild to moderate diffuse spinal stenosis C3-4 through C6/7, moderate/severe left C4 and bilateral C5/6 foraminal narrowing with severe left C7 foraminal stenosis, loss of normal flow void within the right cerebral artery could reflect slow flow and/or occlusion.  MR T-spine grossly normal appearance of the thoracic spinal cord, degenerative disc bulging facet hypertrophy, moderate spinal stenosis L2/3, L3/4, moderate/severe foraminal narrowing L2/3, L3/4.  Patient was started empirically on vancomycin  and cefepime  given fever of unclear etiology.  [facility] was consulted for admission for  further evaluation management of fever, bilateral lower extremity pain.   Assessment & Plan:   Bilateral lower extremity pain with radiculopathy Ambulatory dysfunction Hx CVA with residual left-sided weakness Patient presenting to ED with worsening bilateral lower extremity pain, with focus of pain at the level of bilateral knees extending distally.  Patient reports radicular symptoms associated with paresthesias.  Denies trauma.  MR brain without acute finding.  MRI T/L-spine with significant multilevel spinal stenosis, foraminal narrowing.  X-rays of bilateral knees with noted arthritic changes, no acute bony abnormality with small joint effusion.  Seen by orthopedics; who believes her symptoms are radicular in nature not related to arthritic changes or concerns for active knee intra-articular pathology. -- Neurosurgery following, awaiting Dr. Charlestine Conquest expert opinion; having trouble obtaining history due to language barrier -- Acetaminophen  650 mg q6h PRN mild pain -- Prednisone  60 mg PO daily x 5d, f/b 40 mg PO daily x 5d, f/b 20 mg PO daily x 5d -- Gabapentin  200 mg p.o. 3 times daily -- Voltaren  gel to bilateral lower extremities 4 times daily -- Oxycodone  2.5-5 mg PO q4h PRN moderate/severe pain -- Will continue to monitor response after initiating prednisone , gabapentin , Voltaren  gel; poor candidate for systemic NSAIDs given renal insufficiency -- Continue PT/OT efforts while inpatient -- Await further recommendations from neurosurgery  Fever, unclear etiology Positive blood culture Patient presenting with a temperature of 100.1 F.  WBC count within normal limits, lactic acid normal.  COVID/influenza/RSV PCR negative.  Urinalysis unrevealing.  Chest x-ray with no active cardiopulmonary disease process.  Bilateral lower extremity vascular duplex ultrasound negative for DVT.  Respiratory viral panel negative.  Blood cultures on admission 5/19 1 out  of 4 positive for GPC's anaerobic bottle  only, suspicious for contamination. -- Cultures x 2 5/19: + 1/5 anaerobic bottle only GPC's, awaiting further identification -- Repeat blood cultures 5/22: No growth less than 12 hours -- Continue vancomycin , pharmacy consult for dosing/monitoring -- Continue cefepime  1 g IV every 24 hours -- Continue antibiotics for total course of 7 days  Acute renal failure on CKD stage IIIb -- Cr 2.55>2.25>2.59>2.89>2.66>2.81>2.39 -- Avoid nephrotoxins, renally dose all medications -- BMP daily  Abnormal MRI-incidental finding: MRI showed Loss of normal flow void within the right vertebral artery, which could reflect slow flow and/or occlusion. Discussed with Neurology, Dr. Cleone Dad by Dr. Bobbetta Burnet; patient leg symptoms are not explained by MRI finding and felt to be incidental  Hypokalemia Repleted  DM2 Hemoglobin A1c 7.0 on 05/05/2023.  Diet controlled at baseline. -- Increase Semglee  to 25u [place] daily -- Novolog  5u TIDAC -- resistant SSI for coverage 2/2 steroids as above -- CBGs qAC/HS -- Continue to monitor and adjust   HTN -- Amlodipine  5 mg p.o. daily -- Continue aspirin  and statin  HLD -- Crestor  40 mg p.o. daily  Asthma -- Breo Ellipta  1 puff daily -- Albuterol  neb as needed  DVT prophylaxis: Place TED hose Start: 05/27/23 1554 heparin  injection 5,000 Units Start: 05/26/23 1400    Code Status: Full Code Family Communication: No family present at bedside this morning  Disposition Plan:  Level of care: Telemetry Medical Status is: Inpatient Remains inpatient appropriate because: pending SNF placement    Consultants:  Orthopedics Neurosurgery, Dr. Michale Age Dr. Bobbetta Burnet discussed with neurology, Dr. Cleone Dad regarding abnormal MRI findings  Procedures:  None  Antimicrobials:  Vancomycin  5/19>> Cefepime  5/19>> Metronidazole  5/19 - 5/19   Subjective: Patient seen examined bedside, lying in bed.  Unfortunately no interpretation available via video interpreter today as no availability  (Khmer).  Patient reports pain improved, due to language barrier unable to obtain any further ROS.  No family present at bedside.  No acute events overnight per nursing staff.  Awaiting SNF placement.  Objective: Vitals:   05/30/23 2034 05/31/23 0541 05/31/23 0835 05/31/23 0856  BP: 114/60 (!) 161/89 113/64   Pulse: 80 89    Resp: 16 16 18    Temp: 98.7 F (37.1 C) (!) 97.5 F (36.4 C) 97.7 F (36.5 C)   TempSrc:   Oral   SpO2: 100% 100% 100% 99%  Weight:      Height:        Intake/Output Summary (Last 24 hours) at 05/31/2023 1028 Last data filed at 05/30/2023 1758 Gross per 24 hour  Intake 480 ml  Output --  Net 480 ml   Filed Weights   05/25/23 0936  Weight: 45.4 kg    Examination:  Physical Exam: GEN: NAD, alert and oriented x 3, elderly in appearance HEENT: NCAT, PERRL, EOMI, sclera clear, MMM PULM: CTAB w/o wheezes/crackles, normal respiratory effort, on room air CV: RRR w/o M/G/R GI: abd soft, NTND, + BS MSK: no peripheral edema, moves all extremities independently, slight TTP BLE from knee>distally NEURO: CN II-XII intact, no focal deficits, sensation to light touch intact PSYCH: normal mood/affect Integumentary: No concerning rashes/lesions/wounds noted on exposed skin surfaces    Data Reviewed: I have personally reviewed following labs and imaging studies  CBC: Recent Labs  Lab 05/25/23 1150 05/26/23 0533 05/27/23 0700 05/28/23 0522 05/28/23 2119 05/29/23 0823 05/30/23 0409  WBC 13.3*   < > 11.4* 11.0* 10.6* 9.5 7.0  NEUTROABS 9.8*  --   --   --   --   --   --  HGB 10.7*   < > 10.0* 10.2* 10.2* 9.7* 9.3*  HCT 31.7*   < > 28.7* 30.7* 29.5* 28.7* 27.3*  MCV 87.3   < > 85.2 87.5 86.0 86.2 86.7  PLT 209   < > 169 175 193 167 188   < > = values in this interval not displayed.   Basic Metabolic Panel: Recent Labs  Lab 05/27/23 0700 05/28/23 0522 05/29/23 0823 05/29/23 2221 05/30/23 0409 05/31/23 0627  NA 136 138 138  --  134* 138  K 3.3* 3.9  3.7  --  3.8 4.3  CL 103 108 109  --  105 109  CO2 22 20* 20*  --  20* 20*  GLUCOSE 241* 154* 307* 696* 474* 308*  BUN 43* 45* 35*  --  50* 54*  CREATININE 2.59* 2.89* 2.66*  --  2.81* 2.39*  CALCIUM  8.7* 8.8* 8.5*  --  8.7* 9.2   GFR: Estimated Creatinine Clearance: 13.7 mL/min (A) (by C-G formula based on SCr of 2.39 mg/dL (H)). Liver Function Tests: Recent Labs  Lab 05/25/23 1138 05/27/23 0700 05/28/23 0522 05/29/23 0823 05/30/23 0409  AST 24 24 22  53* 35  ALT 15 13 15 30 30   ALKPHOS 92 88 99 170* 164*  BILITOT 0.8 0.7 0.5 0.2 0.4  PROT 8.6* 7.1 7.3 7.1 7.0  ALBUMIN 3.8 2.8* 2.7* 2.4* 2.3*   No results for input(s): "LIPASE", "AMYLASE" in the last 168 hours. No results for input(s): "AMMONIA" in the last 168 hours. Coagulation Profile: No results for input(s): "INR", "PROTIME" in the last 168 hours. Cardiac Enzymes: Recent Labs  Lab 05/25/23 1138  CKTOTAL 206   BNP (last 3 results) No results for input(s): "PROBNP" in the last 8760 hours. HbA1C: No results for input(s): "HGBA1C" in the last 72 hours. CBG: Recent Labs  Lab 05/30/23 0750 05/30/23 1107 05/30/23 1517 05/30/23 2154 05/31/23 0621  GLUCAP 335* 329* 346* 405* 316*   Lipid Profile: No results for input(s): "CHOL", "HDL", "LDLCALC", "TRIG", "CHOLHDL", "LDLDIRECT" in the last 72 hours. Thyroid  Function Tests: No results for input(s): "TSH", "T4TOTAL", "FREET4", "T3FREE", "THYROIDAB" in the last 72 hours. Anemia Panel: No results for input(s): "VITAMINB12", "FOLATE", "FERRITIN", "TIBC", "IRON", "RETICCTPCT" in the last 72 hours. Sepsis Labs: Recent Labs  Lab 05/25/23 1201 05/28/23 2119  LATICACIDVEN 0.7 1.1    Recent Results (from the past 240 hours)  Blood culture (routine x 2)     Status: None   Collection Time: 05/25/23 11:50 AM   Specimen: BLOOD  Result Value Ref Range Status   Specimen Description BLOOD SITE NOT SPECIFIED  Final   Special Requests   Final    BOTTLES DRAWN AEROBIC AND  ANAEROBIC Blood Culture results may not be optimal due to an inadequate volume of blood received in culture bottles   Culture   Final    NO GROWTH 5 DAYS Performed at [facility], [address] [place], [place] [address]    Report Status 05/30/2023 FINAL  Final  Blood culture (routine x 2)     Status: None (Preliminary result)   Collection Time: 05/25/23 11:55 AM   Specimen: BLOOD  Result Value Ref Range Status   Specimen Description BLOOD SITE NOT SPECIFIED  Final   Special Requests   Final    BOTTLES DRAWN AEROBIC AND ANAEROBIC Blood Culture results may not be optimal due to an inadequate volume of blood received in culture bottles   Culture  Setup Time   Final  GRAM POSITIVE RODS ANAEROBIC BOTTLE ONLY CRITICAL RESULT CALLED TO, READ BACK BY AND VERIFIED WITH: V [email or web address]  05/28/23 MK    Culture   Final    GRAM POSITIVE RODS CULTURE REINCUBATED FOR BETTER GROWTH Performed at [facility], [address] [place], [place] [address]    Report Status PENDING  Incomplete  Resp panel by RT-PCR (RSV, Flu A&B, Covid) Anterior Nasal Swab     Status: None   Collection Time: 05/25/23  3:58 PM   Specimen: Anterior Nasal Swab  Result Value Ref Range Status   SARS Coronavirus 2 by RT PCR NEGATIVE NEGATIVE Final   Influenza A by PCR NEGATIVE NEGATIVE Final   Influenza B by PCR NEGATIVE NEGATIVE Final    Comment: (NOTE) The Xpert Xpress SARS-CoV-2/FLU/RSV plus assay is intended as an aid in the diagnosis of influenza from Nasopharyngeal swab specimens and should not be used as a sole basis for treatment. Nasal washings and aspirates are unacceptable for Xpert Xpress SARS-CoV-2/FLU/RSV testing.  Fact Sheet for Patients: BloggerCourse.com  Fact Sheet for Healthcare Providers: SeriousBroker.it  This test is not yet approved or cleared by the [place]  FDA and has been authorized for detection and/or diagnosis of  SARS-CoV-2 by FDA under an Emergency Use Authorization (EUA). This EUA will remain in effect (meaning this test can be used) for the duration of the COVID-19 declaration under Section 564(b)(1) of the Act, 21 U.S.C. section 360bbb-3(b)(1), unless the authorization is terminated or revoked.     Resp Syncytial Virus by PCR NEGATIVE NEGATIVE Final    Comment: (NOTE) Fact Sheet for Patients: BloggerCourse.com  Fact Sheet for Healthcare Providers: SeriousBroker.it  This test is not yet approved or cleared by the [place]  FDA and has been authorized for detection and/or diagnosis of SARS-CoV-2 by FDA under an Emergency Use Authorization (EUA). This EUA will remain in effect (meaning this test can be used) for the duration of the COVID-19 declaration under Section 564(b)(1) of the Act, 21 U.S.C. section 360bbb-3(b)(1), unless the authorization is terminated or revoked.  Performed at [facility], [address] [place], [place] [address]   Culture, blood (Routine X 2) w Reflex to ID Panel     Status: None (Preliminary result)   Collection Time: 05/28/23  9:19 PM   Specimen: BLOOD RIGHT ARM  Result Value Ref Range Status   Specimen Description BLOOD RIGHT ARM  Final   Special Requests   Final    BOTTLES DRAWN AEROBIC AND ANAEROBIC Blood Culture results may not be optimal due to an inadequate volume of blood received in culture bottles   Culture   Final    NO GROWTH 3 DAYS Performed at [facility], [address] [place], [place] [address]    Report Status PENDING  Incomplete  Culture, blood (Routine X 2) w Reflex to ID Panel     Status: None (Preliminary result)   Collection Time: 05/28/23  9:21 PM   Specimen: BLOOD RIGHT ARM  Result Value Ref Range Status   Specimen Description BLOOD RIGHT ARM  Final   Special Requests   Final    BOTTLES DRAWN AEROBIC AND ANAEROBIC Blood Culture results may not be optimal due  to an inadequate volume of blood received in culture bottles   Culture   Final    NO GROWTH 3 DAYS Performed at [facility], [address], [place], [place] [address]    Report Status PENDING  Incomplete  Respiratory (~  20 pathogens) panel by PCR     Status: None   Collection Time: 05/29/23  1:21 AM   Specimen: Nasopharyngeal Swab; Respiratory  Result Value Ref Range Status   Adenovirus NOT DETECTED NOT DETECTED Final   Coronavirus 229E NOT DETECTED NOT DETECTED Final    Comment: (NOTE) The Coronavirus on the Respiratory Panel, DOES NOT test for the novel  Coronavirus (2019 nCoV)    Coronavirus HKU1 NOT DETECTED NOT DETECTED Final   Coronavirus NL63 NOT DETECTED NOT DETECTED Final   Coronavirus OC43 NOT DETECTED NOT DETECTED Final   Metapneumovirus NOT DETECTED NOT DETECTED Final   Rhinovirus / Enterovirus NOT DETECTED NOT DETECTED Final   Influenza A NOT DETECTED NOT DETECTED Final   Influenza B NOT DETECTED NOT DETECTED Final   Parainfluenza Virus 1 NOT DETECTED NOT DETECTED Final   Parainfluenza Virus 2 NOT DETECTED NOT DETECTED Final   Parainfluenza Virus 3 NOT DETECTED NOT DETECTED Final   Parainfluenza Virus 4 NOT DETECTED NOT DETECTED Final   Respiratory Syncytial Virus NOT DETECTED NOT DETECTED Final   Bordetella pertussis NOT DETECTED NOT DETECTED Final   Bordetella Parapertussis NOT DETECTED NOT DETECTED Final   Chlamydophila pneumoniae NOT DETECTED NOT DETECTED Final   Mycoplasma pneumoniae NOT DETECTED NOT DETECTED Final    Comment: Performed at [facility], [address], [place], [place] [address]         Radiology Studies: No results found.       Scheduled Meds:  amLODipine   5 mg Oral Daily   aspirin   81 mg Oral Daily   diclofenac  Sodium  4 g Topical QID   fluticasone  furoate-vilanterol  1 puff Inhalation Daily   gabapentin   200 mg Oral TID   heparin   5,000 Units Subcutaneous Q8H   insulin  aspart  0-20 Units Subcutaneous TID WC    insulin  aspart  0-5 Units Subcutaneous QHS   insulin  aspart  5 Units Subcutaneous TID WC   insulin  glargine-yfgn  25 Units Subcutaneous Daily   mirtazapine   30 mg Oral QHS   polyethylene glycol  17 g Oral Daily   predniSONE   60 mg Oral Q breakfast   Followed by   Cecily Cohen ON 06/03/2023] predniSONE   40 mg Oral Q breakfast   Followed by   Cecily Cohen ON 06/08/2023] predniSONE   20 mg Oral Q breakfast   rosuvastatin   40 mg Oral Daily   senna-docusate  1 tablet Oral BID   Continuous Infusions:  ceFEPime  (MAXIPIME ) IV 1 g (05/30/23 2313)   vancomycin  500 mg (05/30/23 0831)     LOS: 5 days    Time spent: 47 minutes spent on 05/31/2023 caring for this patient face-to-face including chart review, ordering labs/tests, documenting, discussion with nursing staff, consultants, updating family and interview/physical exam    Rema Care [place], DO [facility] Available via Epic secure chat 7am-7pm After these hours, please refer to coverage provider listed on [email or web address] 05/31/2023, 10:28 AM

## 2023-05-31 NOTE — Plan of Care (Signed)
  Problem: Health Behavior/Discharge Planning: Goal: Ability to manage health-related needs will improve Outcome: Progressing   Problem: Clinical Measurements: Goal: Ability to maintain clinical measurements within normal limits will improve Outcome: Progressing Goal: Will remain free from infection Outcome: Completed/Met Goal: Diagnostic test results will improve Outcome: Progressing Goal: Respiratory complications will improve Outcome: Completed/Met Goal: Cardiovascular complication will be avoided Outcome: Progressing   Problem: Activity: Goal: Risk for activity intolerance will decrease Outcome: Progressing   Problem: Nutrition: Goal: Adequate nutrition will be maintained Outcome: Progressing   Problem: Coping: Goal: Level of anxiety will decrease Outcome: Progressing   Problem: Elimination: Goal: Will not experience complications related to bowel motility Outcome: Progressing

## 2023-05-31 NOTE — Plan of Care (Signed)
  Problem: Activity: Goal: Risk for activity intolerance will decrease Outcome: Progressing   Problem: Elimination: Goal: Will not experience complications related to bowel motility Outcome: Progressing   Problem: Safety: Goal: Ability to remain free from injury will improve Outcome: Progressing   

## 2023-06-01 DIAGNOSIS — M79604 Pain in right leg: Secondary | ICD-10-CM | POA: Diagnosis not present

## 2023-06-01 DIAGNOSIS — M79605 Pain in left leg: Secondary | ICD-10-CM | POA: Diagnosis not present

## 2023-06-01 LAB — BASIC METABOLIC PANEL WITH GFR
Anion gap: 12 (ref 5–15)
BUN: 69 mg/dL — ABNORMAL HIGH (ref 8–23)
CO2: 16 mmol/L — ABNORMAL LOW (ref 22–32)
Calcium: 8.6 mg/dL — ABNORMAL LOW (ref 8.9–10.3)
Chloride: 107 mmol/L (ref 98–111)
Creatinine, Ser: 2.74 mg/dL — ABNORMAL HIGH (ref 0.44–1.00)
GFR, Estimated: 17 mL/min — ABNORMAL LOW (ref >60)
Glucose, Bld: 372 mg/dL — ABNORMAL HIGH (ref 70–99)
Potassium: 4.5 mmol/L (ref 3.5–5.1)
Sodium: 135 mmol/L (ref 135–145)

## 2023-06-01 LAB — GLUCOSE, CAPILLARY
Glucose-Capillary: 237 mg/dL — ABNORMAL HIGH (ref 70–99)
Glucose-Capillary: 385 mg/dL — ABNORMAL HIGH (ref 70–99)
Glucose-Capillary: 343 mg/dL — ABNORMAL HIGH (ref 70–99)
Glucose-Capillary: 315 mg/dL — ABNORMAL HIGH (ref 70–99)

## 2023-06-01 MED ORDER — INSULIN ASPART 100 UNIT/ML IJ SOLN
10.0000 [IU] | Freq: Three times a day (TID) | INTRAMUSCULAR | Status: DC
  Administered 2023-06-01 (×3): 10 [IU] via SUBCUTANEOUS

## 2023-06-01 MED ORDER — INSULIN GLARGINE-YFGN 100 UNIT/ML ~~LOC~~ SOLN
35.0000 [IU] | Freq: Every day | SUBCUTANEOUS | Status: DC
  Administered 2023-06-01: 35 [IU] via SUBCUTANEOUS
  Filled 2023-06-01 (×2): qty 0.35

## 2023-06-01 NOTE — Progress Notes (Signed)
 Physical Therapy Treatment Patient Details Name: Nichole Munoz MRN: [id number] DOB: 1945-06-27 Today's Date: 06/01/2023   History of Present Illness Pt is a 78 y.o female admitted from home 5/19 for bilateral leg pain. Negative for DVT, UTI. MRI brain and spine showed no acute trauma, mild to moderate spinal stenosis. Recent admission 01/2023 for syncope, 4/28-5/2 encephalopathy  PMH: CVA with residual L sided weakness, CKD, HTN, asthma, neuropathy, DM, diverticulitiss, C7 fx 2018    PT Comments  Pt received in supine, agreeable to therapy session with encouragement, pt c/o BLE pain but with improved transfer tolerance this date compared with previous session. Able to begin pre-gait training with RW support at bedside, pt with ataxic stepping with standing hip flexion and will need +2 for safety for gait progression in room or hallway with RW support next session. Pt reporting lack of dentition make uncooked vegetables difficult for her to chew, RN notified as meal tray arrived during her session. Pt states her son is a long Production assistant, radio and often goes out of state for his work. No iPad interpreter available or family present so used [place] and iphone Google translate as needed. Pt up in chair at end of session, alarm on for safety. Patient will benefit from continued inpatient follow up therapy, <3 hours/day.    If plan is discharge home, recommend the following: Two people to help with walking and/or transfers;A lot of help with bathing/dressing/bathroom;Assistance with cooking/housework;Direct supervision/assist for medications management;Help with stairs or ramp for entrance   Can travel by private vehicle     No  Equipment Recommendations  Wheelchair (measurements PT)    Recommendations for Other Services       Precautions / Restrictions Precautions Precautions: Fall Recall of Precautions/Restrictions: Impaired Precaution/Restrictions Comments: [facility] Restrictions Weight Bearing  Restrictions Per Provider Order: No     Mobility  Bed Mobility Overal bed mobility: Needs Assistance Bed Mobility: Supine to Sit     Supine to sit: Min assist, HOB elevated, Used rails     General bed mobility comments: use of bed features and rail; bed pad assist    Transfers Overall transfer level: Needs assistance Equipment used: Ambulation equipment used, Rolling walker (2 wheels) Transfers: Sit to/from Stand, Bed to chair/wheelchair/BSC Sit to Stand: Min assist, From elevated surface           General transfer comment: EOB> Stedy and Stedy to chair, then pt able to stand to/from RW with +1 assist from chair height. Transfer via Lift Equipment: Stedy  Ambulation/Gait Ambulation/Gait assistance: Art gallery manager device: Rolling walker (2 wheels) Gait Pattern/deviations: Ataxic, Trunk flexed     Pre-gait activities: standing hip flexion x10 reps, ataxic stepping with inconsistent foot placement, minA for stability; forward/backward stepping a couple feet in front of chair but defer longer distance as pt meal arrived and pt very motivated to eat dinner and would need +2 assist for gait progression for pt safety.     Stairs             Wheelchair Mobility     Tilt Bed    Modified Rankin (Stroke Patients Only)       Balance Overall balance assessment: Needs assistance Sitting-balance support: Feet supported Sitting balance-Leahy Scale: Fair Sitting balance - Comments: supervision at EOB   Standing balance support: Bilateral upper extremity supported, Reliant on assistive device for balance, During functional activity Standing balance-Leahy Scale: Poor Standing balance comment: RW and external support  Communication Communication Communication: Impaired (no Khmer interpreter available today on iPad -used google translate to Khmer for some questions) Factors Affecting Communication: Non - English speaking,  interpreter not available (pt appears to understand some english)  Cognition Arousal: Alert Behavior During Therapy: Anxious   PT - Cognitive impairments: No family/caregiver present to determine baseline, Difficult to assess Difficult to assess due to: Non-English speaking                     PT - Cognition Comments: patient is able to state her name and is oriented to hospital. she needs intermittent repetition and multi modal cues for single step command following. Unclear what her baseline is and per RN, no visitors that she is aware of for past 3 days. Pt did report she did not attend school so she may be illiterate at baseline, when I used Google translate to [employer] via iPhone, she listened to the speech through the phone speaker but didn't look at the screen for the written language. Following commands: Impaired Following commands impaired: Follows one step commands inconsistently, Follows one step commands with increased time    Cueing Cueing Techniques: Verbal cues, Gestural cues, Tactile cues, Visual cues  Exercises Other Exercises Other Exercises: standing hip flexion x10 reps ea with RW    General Comments General comments (skin integrity, edema, etc.): pt has briefs donned, RN reports she was changed recently. no acute s/sx distress, pt reports she doesn't have all her teeth so can't eat some of the uncooked vegetables (carrots etc) on her tray due to her dentition, RN notified.      Pertinent Vitals/Pain Pain Assessment Pain Assessment: Faces Faces Pain Scale: Hurts even more Pain Location: BLE (knees/legs) with standing Pain Descriptors / Indicators: Discomfort, Grimacing, Guarding Pain Intervention(s): Limited activity within patient's tolerance, Monitored during session, Repositioned    Home Living                          Prior Function            PT Goals (current goals can now be found in the care plan section) Acute Rehab PT Goals Patient  Stated Goal: Less pain in my legs PT Goal Formulation: Patient unable to participate in goal setting Time For Goal Achievement: 06/11/23 Progress towards PT goals: Progressing toward goals    Frequency    Min 2X/week      PT Plan      Co-evaluation              AM-PAC PT "6 Clicks" Mobility   Outcome Measure  Help needed turning from your back to your side while in a flat bed without using bedrails?: A Little Help needed moving from lying on your back to sitting on the side of a flat bed without using bedrails?: A Lot Help needed moving to and from a bed to a chair (including a wheelchair)?: A Lot Help needed standing up from a chair using your arms (e.g., wheelchair or bedside chair)?: A Lot Help needed to walk in hospital room?: Total Help needed climbing 3-5 steps with a railing? : Total 6 Click Score: 11    End of Session Equipment Utilized During Treatment: Gait belt Activity Tolerance: Patient tolerated treatment well;Patient limited by fatigue;Other (comment) (food arrived and pt motivated to eat dinner) Patient left: in chair;with call bell/phone within reach;with chair alarm set Nurse Communication: Mobility status;Need for lift equipment;Other (comment) (does well  with Stedy or +2 safety wtih RW) PT Visit Diagnosis: Unsteadiness on feet (R26.81);Difficulty in walking, not elsewhere classified (R26.2)     Time: 1610-9604 PT Time Calculation (min) (ACUTE ONLY): 20 min  Charges:    $Therapeutic Activity: 8-22 mins PT General Charges $$ ACUTE PT VISIT: 1 Visit                     Precilla Purnell P., PTA Acute Rehabilitation Services Secure Chat Preferred 9a-5:30pm Office: [phone]    Mariel Shope [facility] 06/01/2023, 6:25 PM

## 2023-06-01 NOTE — Progress Notes (Signed)
 PROGRESS NOTE    Nichole Munoz  NGE:[id number] DOB: Mar 21, 1945 DOA: 05/25/2023 PCP: [facility]    Brief Narrative:   Nichole Munoz is a 78 y.o. female with past medical history significant for CVA with mild residual left-sided weakness and neuropathy, CKD3b, DM2, HTN, HLD, and asthma who presented to [facility] on 5/19 with complaints of bilateral leg pain and new onset of immobility.  Apparently was ambulatory until 5/19 night, c/o severe leg pain and has been unable to walk since.  Additionally complaining of numbness and tingling in both of her legs from bilateral knees distally.    In the ED, temperature 100.1 F, HR 95, RR 19, BP 142/47, SpO2 97% on room air.  Sodium 13.3, potassium 10.7, plate count 401.  Sodium 139, potassium 4.0, chloride 104, CO2 22, glucose 176, BUN 33, creatinine 2.55.  AST 24, ALT 19, total bilirubin 0.8.  CK2 06.  Lactic acid 0.7.  COVID/influenza/RSV PCR negative.  MRI brain without contrast with no acute intracranial abnormality, noted age-related cerebral atrophy with mild chronic small vessel ischemic disease and small remote Luking or infarcts bilateral basal ganglia.  MRI C-spine no evidence for acute traumatic injury, moderate right paracentral disc extrusion with superior migration C7-T1 with mild/moderate spinal stenosis, additional multilevel cervical spondylosis with mild to moderate diffuse spinal stenosis C3-4 through C6/7, moderate/severe left C4 and bilateral C5/6 foraminal narrowing with severe left C7 foraminal stenosis, loss of normal flow void within the right cerebral artery could reflect slow flow and/or occlusion.  MR T-spine grossly normal appearance of the thoracic spinal cord, degenerative disc bulging facet hypertrophy, moderate spinal stenosis L2/3, L3/4, moderate/severe foraminal narrowing L2/3, L3/4.  Patient was started empirically on vancomycin  and cefepime  given fever of unclear etiology.  TRH was consulted for admission for  further evaluation management of fever, bilateral lower extremity pain.   Assessment & Plan:   Bilateral lower extremity pain with radiculopathy Ambulatory dysfunction Hx CVA with residual left-sided weakness Patient presenting to ED with worsening bilateral lower extremity pain, with focus of pain at the level of bilateral knees extending distally.  Patient reports radicular symptoms associated with paresthesias.  Denies trauma.  MR brain without acute finding.  MRI T/L-spine with significant multilevel spinal stenosis, foraminal narrowing.  X-rays of bilateral knees with noted arthritic changes, no acute bony abnormality with small joint effusion.  Seen by orthopedics; who believes her symptoms are radicular in nature not related to arthritic changes or concerns for active knee intra-articular pathology. -- Neurosurgery following, awaiting Dr. Charlestine Conquest expert opinion; having trouble obtaining history due to language barrier -- Acetaminophen  650 mg q6h PRN mild pain -- Prednisone  60 mg PO daily x 5d, f/b 40 mg PO daily x 5d, f/b 20 mg PO daily x 5d -- Gabapentin  200 mg p.o. 3 times daily -- Voltaren  gel to bilateral lower extremities 4 times daily -- Oxycodone  2.5-5 mg PO q4h PRN moderate/severe pain -- Will continue to monitor response after initiating prednisone , gabapentin , Voltaren  gel; poor candidate for systemic NSAIDs given renal insufficiency -- Continue PT/OT efforts while inpatient -- Await further recommendations from neurosurgery  Fever, unclear etiology Positive blood culture Patient presenting with a temperature of 100.1 F.  WBC count within normal limits, lactic acid normal.  COVID/influenza/RSV PCR negative.  Urinalysis unrevealing.  Chest x-ray with no active cardiopulmonary disease process.  Bilateral lower extremity vascular duplex ultrasound negative for DVT.  Respiratory viral panel negative.  Blood cultures on admission 5/19 1 out  of 4 positive for GPC's anaerobic bottle  only, suspicious for contamination.  Completed 7-day course of IV ceftriaxone  and vancomycin  on 5/26.  Will continue to monitor off of antibiotics.  Remains afebrile. -- Cultures x 2 5/19: + 1/5 anaerobic bottle only GPR's, awaiting further identification -- Repeat blood cultures 5/22: No growth x 4 days  Acute renal failure on CKD stage IIIb -- Cr 2.55>2.25>2.59>2.89>2.66>2.81>2.39>2.74 -- Avoid nephrotoxins, renally dose all medications -- BMP daily  Abnormal MRI-incidental finding: MRI showed Loss of normal flow void within the right vertebral artery, which could reflect slow flow and/or occlusion. Discussed with Neurology, Dr. Cleone Dad by Dr. Bobbetta Burnet; patient leg symptoms are not explained by MRI finding and felt to be incidental  Hypokalemia Repleted  DM2 Hemoglobin A1c 7.0 on 05/05/2023.  Diet controlled at baseline. -- Increase Semglee  to 35u [place] daily -- Novolog  10u TIDAC -- resistant SSI for coverage 2/2 steroids as above -- CBGs qAC/HS -- Continue to monitor and adjust   HTN -- Amlodipine  5 mg p.o. daily -- Continue aspirin  and statin  HLD -- Crestor  40 mg p.o. daily  Asthma -- Breo Ellipta  1 puff daily -- Albuterol  neb as needed  DVT prophylaxis: Place TED hose Start: 05/27/23 1554 heparin  injection 5,000 Units Start: 05/26/23 1400    Code Status: Full Code Family Communication: No family present at bedside this morning; attempted to contact patient's children via telephone, telephone not connected  Disposition Plan:  Level of care: Telemetry Medical Status is: Inpatient Remains inpatient appropriate because: pending SNF placement    Consultants:  Orthopedics Neurosurgery, Dr. Michale Age Dr. Bobbetta Burnet discussed with neurology, Dr. Cleone Dad regarding abnormal MRI findings  Procedures:  None  Antimicrobials:  Vancomycin  5/19>> Cefepime  5/19>> Metronidazole  5/19 - 5/19   Subjective: Patient seen examined bedside, lying in bed.  Unfortunately no interpretation available  via video interpreter today as no availability (Khmer).  Patient reports pain improved, due to language barrier unable to obtain any further ROS.  No family present at bedside; telephone disconnected.  No acute events overnight per nursing staff.  Awaiting SNF placement.  Objective: Vitals:   05/31/23 2333 06/01/23 0448 06/01/23 0750 06/01/23 0812  BP: (!) 141/72 (!) 146/86  (!) 140/79  Pulse: 82 80  86  Resp: 16 16  16   Temp: 98 F (36.7 C) (!) 97.2 F (36.2 C)  97.8 F (36.6 C)  TempSrc:  Oral  Oral  SpO2: 98% 100% 98% 98%  Weight:      Height:        Intake/Output Summary (Last 24 hours) at 06/01/2023 1214 Last data filed at 06/01/2023 5284 Gross per 24 hour  Intake 120 ml  Output --  Net 120 ml   Filed Weights   05/25/23 0936  Weight: 45.4 kg    Examination:  Physical Exam: GEN: NAD, alert and oriented x 3, elderly in appearance HEENT: NCAT, PERRL, EOMI, sclera clear, MMM PULM: CTAB w/o wheezes/crackles, normal respiratory effort, on room air CV: RRR w/o M/G/R GI: abd soft, NTND, + BS MSK: no peripheral edema, moves all extremities independently, slight TTP BLE from knee>distally NEURO: CN II-XII intact, no focal deficits, sensation to light touch intact PSYCH: normal mood/affect Integumentary: No concerning rashes/lesions/wounds noted on exposed skin surfaces    Data Reviewed: I have personally reviewed following labs and imaging studies  CBC: Recent Labs  Lab 05/27/23 0700 05/28/23 0522 05/28/23 2119 05/29/23 0823 05/30/23 0409  WBC 11.4* 11.0* 10.6* 9.5 7.0  HGB 10.0* 10.2* 10.2* 9.7*  9.3*  HCT 28.7* 30.7* 29.5* 28.7* 27.3*  MCV 85.2 87.5 86.0 86.2 86.7  PLT 169 175 193 167 188   Basic Metabolic Panel: Recent Labs  Lab 05/28/23 0522 05/29/23 0823 05/29/23 2221 05/30/23 0409 05/31/23 0627 06/01/23 0847  NA 138 138  --  134* 138 135  K 3.9 3.7  --  3.8 4.3 4.5  CL 108 109  --  105 109 107  CO2 20* 20*  --  20* 20* 16*  GLUCOSE 154* 307*  696* 474* 308* 372*  BUN 45* 35*  --  50* 54* 69*  CREATININE 2.89* 2.66*  --  2.81* 2.39* 2.74*  CALCIUM  8.8* 8.5*  --  8.7* 9.2 8.6*   GFR: Estimated Creatinine Clearance: 11.9 mL/min (A) (by C-G formula based on SCr of 2.74 mg/dL (H)). Liver Function Tests: Recent Labs  Lab 05/27/23 0700 05/28/23 0522 05/29/23 0823 05/30/23 0409  AST 24 22 53* 35  ALT 13 15 30 30   ALKPHOS 88 99 170* 164*  BILITOT 0.7 0.5 0.2 0.4  PROT 7.1 7.3 7.1 7.0  ALBUMIN 2.8* 2.7* 2.4* 2.3*   No results for input(s): "LIPASE", "AMYLASE" in the last 168 hours. No results for input(s): "AMMONIA" in the last 168 hours. Coagulation Profile: No results for input(s): "INR", "PROTIME" in the last 168 hours. Cardiac Enzymes: No results for input(s): "CKTOTAL", "CKMB", "CKMBINDEX", "TROPONINI" in the last 168 hours.  BNP (last 3 results) No results for input(s): "PROBNP" in the last 8760 hours. HbA1C: No results for input(s): "HGBA1C" in the last 72 hours. CBG: Recent Labs  Lab 05/31/23 1407 05/31/23 1735 05/31/23 2126 06/01/23 0622 06/01/23 1155  GLUCAP 379* 205* 382* 237* 385*   Lipid Profile: No results for input(s): "CHOL", "HDL", "LDLCALC", "TRIG", "CHOLHDL", "LDLDIRECT" in the last 72 hours. Thyroid  Function Tests: No results for input(s): "TSH", "T4TOTAL", "FREET4", "T3FREE", "THYROIDAB" in the last 72 hours. Anemia Panel: No results for input(s): "VITAMINB12", "FOLATE", "FERRITIN", "TIBC", "IRON", "RETICCTPCT" in the last 72 hours. Sepsis Labs: Recent Labs  Lab 05/28/23 2119  LATICACIDVEN 1.1    Recent Results (from the past 240 hours)  Blood culture (routine x 2)     Status: None   Collection Time: 05/25/23 11:50 AM   Specimen: BLOOD  Result Value Ref Range Status   Specimen Description BLOOD SITE NOT SPECIFIED  Final   Special Requests   Final    BOTTLES DRAWN AEROBIC AND ANAEROBIC Blood Culture results may not be optimal due to an inadequate volume of blood received in culture  bottles   Culture   Final    NO GROWTH 5 DAYS Performed at [facility], [address] [place], [place] [address]    Report Status 05/30/2023 FINAL  Final  Blood culture (routine x 2)     Status: None (Preliminary result)   Collection Time: 05/25/23 11:55 AM   Specimen: BLOOD  Result Value Ref Range Status   Specimen Description BLOOD SITE NOT SPECIFIED  Final   Special Requests   Final    BOTTLES DRAWN AEROBIC AND ANAEROBIC Blood Culture results may not be optimal due to an inadequate volume of blood received in culture bottles   Culture  Setup Time   Final    GRAM POSITIVE RODS ANAEROBIC BOTTLE ONLY CRITICAL RESULT CALLED TO, READ BACK BY AND VERIFIED WITH: V [email or web address],[email or web address]  05/28/23 MK    Culture   Final    GRAM POSITIVE RODS IDENTIFICATION TO FOLLOW Performed at [facility]  Lab, [address], [place], [place] [address]    Report Status PENDING  Incomplete  Resp panel by RT-PCR (RSV, Flu A&B, Covid) Anterior Nasal Swab     Status: None   Collection Time: 05/25/23  3:58 PM   Specimen: Anterior Nasal Swab  Result Value Ref Range Status   SARS Coronavirus 2 by RT PCR NEGATIVE NEGATIVE Final   Influenza A by PCR NEGATIVE NEGATIVE Final   Influenza B by PCR NEGATIVE NEGATIVE Final    Comment: (NOTE) The Xpert Xpress SARS-CoV-2/FLU/RSV plus assay is intended as an aid in the diagnosis of influenza from Nasopharyngeal swab specimens and should not be used as a sole basis for treatment. Nasal washings and aspirates are unacceptable for Xpert Xpress SARS-CoV-2/FLU/RSV testing.  Fact Sheet for Patients: BloggerCourse.com  Fact Sheet for Healthcare Providers: SeriousBroker.it  This test is not yet approved or cleared by the [place]  FDA and has been authorized for detection and/or diagnosis of SARS-CoV-2 by FDA under an Emergency Use Authorization (EUA). This EUA will remain in effect (meaning this test can be  used) for the duration of the COVID-19 declaration under Section 564(b)(1) of the Act, 21 U.S.C. section 360bbb-3(b)(1), unless the authorization is terminated or revoked.     Resp Syncytial Virus by PCR NEGATIVE NEGATIVE Final    Comment: (NOTE) Fact Sheet for Patients: BloggerCourse.com  Fact Sheet for Healthcare Providers: SeriousBroker.it  This test is not yet approved or cleared by the [place]  FDA and has been authorized for detection and/or diagnosis of SARS-CoV-2 by FDA under an Emergency Use Authorization (EUA). This EUA will remain in effect (meaning this test can be used) for the duration of the COVID-19 declaration under Section 564(b)(1) of the Act, 21 U.S.C. section 360bbb-3(b)(1), unless the authorization is terminated or revoked.  Performed at [facility], [address] [place], [place] [address]   Culture, blood (Routine X 2) w Reflex to ID Panel     Status: None (Preliminary result)   Collection Time: 05/28/23  9:19 PM   Specimen: BLOOD RIGHT ARM  Result Value Ref Range Status   Specimen Description BLOOD RIGHT ARM  Final   Special Requests   Final    BOTTLES DRAWN AEROBIC AND ANAEROBIC Blood Culture results may not be optimal due to an inadequate volume of blood received in culture bottles   Culture   Final    NO GROWTH 4 DAYS Performed at [facility], [address], [place], [place] [address]    Report Status PENDING  Incomplete  Culture, blood (Routine X 2) w Reflex to ID Panel     Status: None (Preliminary result)   Collection Time: 05/28/23  9:21 PM   Specimen: BLOOD RIGHT ARM  Result Value Ref Range Status   Specimen Description BLOOD RIGHT ARM  Final   Special Requests   Final    BOTTLES DRAWN AEROBIC AND ANAEROBIC Blood Culture results may not be optimal due to an inadequate volume of blood received in culture bottles   Culture   Final    NO GROWTH 4 DAYS Performed at [facility], [address], [place], [place] [address]    Report Status PENDING  Incomplete  Respiratory (~20 pathogens) panel by PCR     Status: None   Collection Time: 05/29/23  1:21 AM   Specimen: Nasopharyngeal Swab; Respiratory  Result Value Ref Range Status   Adenovirus NOT DETECTED NOT DETECTED Final   Coronavirus 229E  NOT DETECTED NOT DETECTED Final    Comment: (NOTE) The Coronavirus on the Respiratory Panel, DOES NOT test for the novel  Coronavirus (2019 nCoV)    Coronavirus HKU1 NOT DETECTED NOT DETECTED Final   Coronavirus NL63 NOT DETECTED NOT DETECTED Final   Coronavirus OC43 NOT DETECTED NOT DETECTED Final   Metapneumovirus NOT DETECTED NOT DETECTED Final   Rhinovirus / Enterovirus NOT DETECTED NOT DETECTED Final   Influenza A NOT DETECTED NOT DETECTED Final   Influenza B NOT DETECTED NOT DETECTED Final   Parainfluenza Virus 1 NOT DETECTED NOT DETECTED Final   Parainfluenza Virus 2 NOT DETECTED NOT DETECTED Final   Parainfluenza Virus 3 NOT DETECTED NOT DETECTED Final   Parainfluenza Virus 4 NOT DETECTED NOT DETECTED Final   Respiratory Syncytial Virus NOT DETECTED NOT DETECTED Final   Bordetella pertussis NOT DETECTED NOT DETECTED Final   Bordetella Parapertussis NOT DETECTED NOT DETECTED Final   Chlamydophila pneumoniae NOT DETECTED NOT DETECTED Final   Mycoplasma pneumoniae NOT DETECTED NOT DETECTED Final    Comment: Performed at [facility], [address] [place], [place] [address]         Radiology Studies: No results found.       Scheduled Meds:  amLODipine   5 mg Oral Daily   aspirin   81 mg Oral Daily   diclofenac  Sodium  4 g Topical QID   fluticasone  furoate-vilanterol  1 puff Inhalation Daily   gabapentin   200 mg Oral TID   heparin   5,000 Units Subcutaneous Q8H   insulin  aspart  0-20 Units Subcutaneous TID WC   insulin  aspart  0-5 Units Subcutaneous QHS   insulin  aspart  10 Units Subcutaneous TID WC   insulin  glargine-yfgn  35  Units Subcutaneous Daily   mirtazapine   30 mg Oral QHS   polyethylene glycol  17 g Oral Daily   predniSONE   60 mg Oral Q breakfast   Followed by   Cecily Cohen ON 06/03/2023] predniSONE   40 mg Oral Q breakfast   Followed by   Cecily Cohen ON 06/08/2023] predniSONE   20 mg Oral Q breakfast   rosuvastatin   40 mg Oral Daily   senna-docusate  1 tablet Oral BID   Continuous Infusions:  ceFEPime  (MAXIPIME ) IV 1 g (05/31/23 2149)   vancomycin  500 mg (06/01/23 0857)     LOS: 6 days    Time spent: 47 minutes spent on 06/01/2023 caring for this patient face-to-face including chart review, ordering labs/tests, documenting, discussion with nursing staff, consultants, updating family and interview/physical exam    Rema [facility] [place], DO [facility] Available via Epic secure chat 7am-7pm After these hours, please refer to coverage provider listed on [email or web address] 06/01/2023, 12:14 PM

## 2023-06-01 NOTE — Plan of Care (Signed)
  Problem: Education: Goal: Knowledge of General Education information will improve Description: Including pain rating scale, medication(s)/side effects and non-pharmacologic comfort measures Outcome: Progressing   Problem: Health Behavior/Discharge Planning: Goal: Ability to manage health-related needs will improve Outcome: Progressing   Problem: Clinical Measurements: Goal: Ability to maintain clinical measurements within normal limits will improve Outcome: Progressing Goal: Diagnostic test results will improve Outcome: Progressing Goal: Cardiovascular complication will be avoided Outcome: Progressing   Problem: Activity: Goal: Risk for activity intolerance will decrease Outcome: Progressing   Problem: Nutrition: Goal: Adequate nutrition will be maintained Outcome: Completed/Met   Problem: Coping: Goal: Level of anxiety will decrease Outcome: Progressing   Problem: Elimination: Goal: Will not experience complications related to bowel motility Outcome: Completed/Met Goal: Will not experience complications related to urinary retention Outcome: Progressing   Problem: Pain Managment: Goal: General experience of comfort will improve and/or be controlled Outcome: Progressing

## 2023-06-01 NOTE — TOC Progression Note (Addendum)
 Transition of Care [facility]) - Progression Note    Patient Details  Name: Nichole Munoz MRN: [id number] Date of Birth: January 02, 1946  [facility] CM/SW Contact  Valley Gavia, LCSWA Phone Number: 06/01/2023, 11:44 AM  Clinical Narrative:     CSW attempted to reach pt's son and daughter via phone to discuss SNF, both numbers disconnected.    Expected Discharge Plan:  (TBD) Barriers to Discharge: Continued Medical Work up, Other (must enter comment) (unclear DC plan at this time, attempting to reach family members)  Expected Discharge Plan and Services In-house Referral: Clinical Social Work     Living arrangements for the past 2 months: Single Family Home                                       Social Determinants of Health (SDOH) Interventions SDOH Screenings   Food Insecurity: No Food Insecurity (05/26/2023)  Housing: Low Risk  (05/26/2023)  Transportation Needs: No Transportation Needs (05/26/2023)  Utilities: Not At Risk (05/26/2023)  Financial Resource Strain: Not on File (04/25/2021)   Received from [place], [place]  Physical Activity: Not on File (04/25/2021)   Received from [place], [place]  Social Connections: Moderately Isolated (05/26/2023)  Stress: Not on File (04/25/2021)   Received from [facility], [place]  Tobacco Use: Low Risk  (05/25/2023)    Readmission Risk Interventions     No data to display

## 2023-06-01 NOTE — Plan of Care (Signed)
   Problem: Activity: Goal: Risk for activity intolerance will decrease Outcome: Progressing   Problem: Safety: Goal: Ability to remain free from injury will improve Outcome: Progressing   Problem: Skin Integrity: Goal: Risk for impaired skin integrity will decrease Outcome: Progressing

## 2023-06-02 DIAGNOSIS — M79605 Pain in left leg: Secondary | ICD-10-CM | POA: Diagnosis not present

## 2023-06-02 DIAGNOSIS — M79604 Pain in right leg: Secondary | ICD-10-CM | POA: Diagnosis not present

## 2023-06-02 LAB — BASIC METABOLIC PANEL WITH GFR
Anion gap: 12 (ref 5–15)
BUN: 72 mg/dL — ABNORMAL HIGH (ref 8–23)
CO2: 19 mmol/L — ABNORMAL LOW (ref 22–32)
Calcium: 9 mg/dL (ref 8.9–10.3)
Chloride: 106 mmol/L (ref 98–111)
Creatinine, Ser: 2.33 mg/dL — ABNORMAL HIGH (ref 0.44–1.00)
GFR, Estimated: 21 mL/min — ABNORMAL LOW (ref >60)
Glucose, Bld: 363 mg/dL — ABNORMAL HIGH (ref 70–99)
Potassium: 4.2 mmol/L (ref 3.5–5.1)
Sodium: 137 mmol/L (ref 135–145)

## 2023-06-02 LAB — CULTURE, BLOOD (ROUTINE X 2)
Culture: NO GROWTH
Culture: NO GROWTH

## 2023-06-02 LAB — GLUCOSE, CAPILLARY
Glucose-Capillary: 289 mg/dL — ABNORMAL HIGH (ref 70–99)
Glucose-Capillary: 93 mg/dL (ref 70–99)
Glucose-Capillary: 279 mg/dL — ABNORMAL HIGH (ref 70–99)
Glucose-Capillary: 193 mg/dL — ABNORMAL HIGH (ref 70–99)

## 2023-06-02 MED ORDER — INSULIN ASPART 100 UNIT/ML IJ SOLN
14.0000 [IU] | Freq: Three times a day (TID) | INTRAMUSCULAR | Status: DC
  Administered 2023-06-02 – 2023-06-03 (×3): 14 [IU] via SUBCUTANEOUS

## 2023-06-02 MED ORDER — INSULIN GLARGINE-YFGN 100 UNIT/ML ~~LOC~~ SOLN
45.0000 [IU] | Freq: Every day | SUBCUTANEOUS | Status: DC
  Administered 2023-06-02 – 2023-06-03 (×2): 45 [IU] via SUBCUTANEOUS
  Filled 2023-06-02 (×2): qty 0.45

## 2023-06-02 NOTE — Progress Notes (Signed)
 PROGRESS NOTE    Nichole Munoz  ZOX:[id number] DOB: Oct 04, 1945 DOA: 05/25/2023 PCP: [facility]    Brief Narrative:   Nichole Munoz is a 78 y.o. 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  Apparently was ambulatory until 5/19 night, c/o severe leg pain and has been unable to walk since.  Additionally complaining of numbness and tingling in both of her legs from bilateral knees distally.    In the ED, temperature 100.1 F, HR 95, RR 19, BP 142/47, SpO2 97% on room air.  Sodium 13.3, potassium 10.7, plate count 811.  Sodium 139, potassium 4.0, chloride 104, CO2 22, glucose 176, BUN 33, creatinine 2.55.  AST 24, ALT 19, total bilirubin 0.8.  CK2 06.  Lactic acid 0.7.  COVID/influenza/RSV PCR negative.  MRI brain without contrast with no acute intracranial abnormality, noted age-related cerebral atrophy with mild chronic small vessel ischemic disease and small remote Luking or infarcts bilateral basal ganglia.  MRI C-spine no evidence for acute traumatic injury, moderate right paracentral disc extrusion with superior migration C7-T1 with mild/moderate spinal stenosis, additional multilevel cervical spondylosis with mild to moderate diffuse spinal stenosis C3-4 through C6/7, moderate/severe left C4 and bilateral C5/6 foraminal narrowing with severe left C7 foraminal stenosis, loss of normal flow void within the right cerebral artery could reflect slow flow and/or occlusion.  MR T-spine grossly normal appearance of the thoracic spinal cord, degenerative disc bulging facet hypertrophy, moderate spinal stenosis L2/3, L3/4, moderate/severe foraminal narrowing L2/3, L3/4.  Patient was started empirically on vancomycin  and cefepime  given fever of unclear etiology.  TR[facility] was consulted for admission for  further evaluation management of fever, bilateral lower extremity pain.   Assessment & Plan:   Bilateral lower extremity pain with radiculopathy Ambulatory dysfunction Hx CVA with residual left-sided weakness Patient presenting to ED with worsening bilateral lower extremity pain, with focus of pain at the level of bilateral knees extending distally.  Patient reports radicular symptoms associated with paresthesias.  Denies trauma.  MR brain without acute finding.  MRI T/L-spine with significant multilevel spinal stenosis, foraminal narrowing.  X-rays of bilateral knees with noted arthritic changes, no acute bony abnormality with small joint effusion.  Seen by orthopedics; who believes her symptoms are radicular in nature not related to arthritic changes or concerns for active knee intra-articular pathology. -- Neurosurgery following, awaiting Dr. Charlestine Conquest expert opinion; having trouble obtaining history due to language barrier -- Acetaminophen  650 mg q6h PRN mild pain -- Prednisone  60 mg PO daily x 5d, f/b 40 mg PO daily x 5d, f/b 20 mg PO daily x 5d -- Gabapentin  200 mg p.o. 3 times daily -- Voltaren  gel to bilateral lower extremities 4 times daily -- Oxycodone  2.5-5 mg PO q4h PRN moderate/severe pain -- Will continue to monitor response after initiating prednisone , gabapentin , Voltaren  gel; poor candidate for systemic NSAIDs given renal insufficiency -- Continue PT/OT efforts while inpatient -- Await further recommendations from neurosurgery  Fever, unclear etiology Positive blood culture Patient presenting with a temperature of 100.1 F.  WBC count within normal limits, lactic acid normal.  COVID/influenza/RSV PCR negative.  Urinalysis unrevealing.  Chest x-ray with no active cardiopulmonary disease process.  Bilateral lower extremity vascular duplex ultrasound negative for DVT.  Respiratory viral panel negative.  Blood cultures on admission 5/19 1 out  of 4 positive for GPC's anaerobic bottle  only, suspicious for contamination.  Completed 7-day course of IV ceftriaxone  and vancomycin  on 5/26.  Will continue to monitor off of antibiotics.  Remains afebrile. -- Cultures x 2 5/19: + 1/5 anaerobic bottle only GPR's, awaiting further identification -- Repeat blood cultures 5/22: No growth x 4 days  Acute renal failure on CKD stage IIIb -- Cr 2.55>2.25>2.59>2.89>2.66>2.81>2.39>2.74>2.33 -- Avoid nephrotoxins, renally dose all medications -- BMP daily  Abnormal MRI-incidental finding: MRI showed Loss of normal flow void within the right vertebral artery, which could reflect slow flow and/or occlusion. Discussed with Neurology, Dr. Cleone Dad by Dr. Bobbetta Burnet; patient leg symptoms are not explained by MRI finding and felt to be incidental  Hypokalemia Repleted  DM2 Hemoglobin A1c 7.0 on 05/05/2023.  Diet controlled at baseline. -- Semglee  45u Pant[place] daily -- Novolog  10u TIDAC -- resistant SSI for coverage 2/2 steroids as above -- CBGs qAC/HS -- Continue to monitor and adjust   HTN -- Amlodipine  5 mg p.o. daily -- Continue aspirin  and statin  HLD -- Crestor  40 mg p.o. daily  Asthma -- Breo Ellipta  1 puff daily -- Albuterol  neb as needed  DVT prophylaxis: Place TED hose Start: 05/27/23 1554 heparin  injection 5,000 Units Start: 05/26/23 1400    Code Status: Full Code Family Communication: No family present at bedside this morning  Disposition Plan:  Level of care: Telemetry Medical Status is: Inpatient Remains inpatient appropriate because: pending SNF placement    Consultants:  Orthopedics Neurosurgery, Dr. Michale Age Dr. Bobbetta Burnet discussed with neurology, Dr. Cleone Dad regarding abnormal MRI findings  Procedures:  None  Antimicrobials:  Vancomycin  5/19>> Cefepime  5/19>> Metronidazole  5/19 - 5/19   Subjective: Patient seen examined bedside, lying in bed.  Assisted with interpretation with video interpreter Sophia [phone].  Patient reports her pain is gradually improving with  prednisone , gabapentin .  No family present.  Discussed would benefit from rehab placement before returning home given that she lives by herself most of the time as her children are out of town frequently.   No acute events overnight per nursing staff.  Denies headache, no chest pain, no shortness of breath, no abdominal pain, no fever, no nausea/vomiting/diarrhea.  Awaiting SNF placement.  Objective: Vitals:   06/01/23 1308 06/01/23 1931 06/02/23 0436 06/02/23 0800  BP: 133/61 (!) 159/86 126/68 123/60  Pulse: 78 (!) 56 86 82  Resp: 18 18 15    Temp: 98.3 F (36.8 C) 98 F (36.7 C) 98.4 F (36.9 C) 98.7 F (37.1 C)  TempSrc: (P) Oral   Oral  SpO2: 100% 95% 97% 99%  Weight:      Height:        Intake/Output Summary (Last 24 hours) at 06/02/2023 1116 Last data filed at 06/01/2023 1300 Gross per 24 hour  Intake 120 ml  Output --  Net 120 ml   Filed Weights   05/25/23 0936  Weight: 45.4 kg    Examination:  Physical Exam: GEN: NAD, alert and oriented x 3, elderly in appearance HEENT: NCAT, PERRL, EOMI, sclera clear, MMM PULM: CTAB w/o wheezes/crackles, normal respiratory effort, on room air CV: RRR w/o M/G/R GI: abd soft, NTND, + BS MSK: no peripheral edema, moves all extremities independently, slight TTP BLE from knee>distally NEURO: CN II-XII intact, no focal deficits, sensation to light touch intact PSYCH: normal mood/affect Integumentary: No concerning rashes/lesions/wounds noted on exposed skin surfaces    Data Reviewed: I have personally reviewed following labs and imaging studies  CBC: Recent Labs  Lab 05/27/23 0700 05/28/23 0522 05/28/23 2119 05/29/23 0823 05/30/23 0409  WBC 11.4* 11.0* 10.6* 9.5 7.0  HGB 10.0* 10.2* 10.2* 9.7* 9.3*  HCT 28.7* 30.7* 29.5* 28.7* 27.3*  MCV 85.2 87.5 86.0 86.2 86.7  PLT 169 175 193 167 188   Basic Metabolic Panel: Recent Labs  Lab 05/29/23 0823 05/29/23 2221 05/30/23 0409 05/31/23 0627 06/01/23 0847 06/02/23 0547   NA 138  --  134* 138 135 137  K 3.7  --  3.8 4.3 4.5 4.2  CL 109  --  105 109 107 106  CO2 20*  --  20* 20* 16* 19*  GLUCOSE 307* 696* 474* 308* 372* 363*  BUN 35*  --  50* 54* 69* 72*  CREATININE 2.66*  --  2.81* 2.39* 2.74* 2.33*  CALCIUM  8.5*  --  8.7* 9.2 8.6* 9.0   GFR: Estimated Creatinine Clearance: 14 mL/min (A) (by C-G formula based on SCr of 2.33 mg/dL (H)). Liver Function Tests: Recent Labs  Lab 05/27/23 0700 05/28/23 0522 05/29/23 0823 05/30/23 0409  AST 24 22 53* 35  ALT 13 15 30 30   ALKPHOS 88 99 170* 164*  BILITOT 0.7 0.5 0.2 0.4  PROT 7.1 7.3 7.1 7.0  ALBUMIN 2.8* 2.7* 2.4* 2.3*   No results for input(s): "LIPASE", "AMYLASE" in the last 168 hours. No results for input(s): "AMMONIA" in the last 168 hours. Coagulation Profile: No results for input(s): "INR", "PROTIME" in the last 168 hours. Cardiac Enzymes: No results for input(s): "CKTOTAL", "CKMB", "CKMBINDEX", "TROPONINI" in the last 168 hours.  BNP (last 3 results) No results for input(s): "PROBNP" in the last 8760 hours. HbA1C: No results for input(s): "HGBA1C" in the last 72 hours. CBG: Recent Labs  Lab 06/01/23 0622 06/01/23 1155 06/01/23 1656 06/01/23 2045 06/02/23 0622  GLUCAP 237* 385* 343* 315* 289*   Lipid Profile: No results for input(s): "CHOL", "HDL", "LDLCALC", "TRIG", "CHOLHDL", "LDLDIRECT" in the last 72 hours. Thyroid  Function Tests: No results for input(s): "TSH", "T4TOTAL", "FREET4", "T3FREE", "THYROIDAB" in the last 72 hours. Anemia Panel: No results for input(s): "VITAMINB12", "FOLATE", "FERRITIN", "TIBC", "IRON", "RETICCTPCT" in the last 72 hours. Sepsis Labs: Recent Labs  Lab 05/28/23 2119  LATICACIDVEN 1.1    Recent Results (from the past 240 hours)  Blood culture (routine x 2)     Status: None   Collection Time: 05/25/23 11:50 AM   Specimen: BLOOD  Result Value Ref Range Status   Specimen Description BLOOD SITE NOT SPECIFIED  Final   Special Requests   Final     BOTTLES DRAWN AEROBIC AND ANAEROBIC Blood Culture results may not be optimal due to an inadequate volume of blood received in culture bottles   Culture   Final    NO GROWTH 5 DAYS Performed at [facility], [address], [place], [place] [address]    Report Status 05/30/2023 FINAL  Final  Blood culture (routine x 2)     Status: Abnormal   Collection Time: 05/25/23 11:55 AM   Specimen: BLOOD  Result Value Ref Range Status   Specimen Description BLOOD SITE NOT SPECIFIED  Final   Special Requests   Final    BOTTLES DRAWN AEROBIC AND ANAEROBIC Blood Culture results may not be optimal due to an inadequate volume of blood received in culture bottles   Culture  Setup Time   Final    GRAM POSITIVE RODS ANAEROBIC BOTTLE ONLY CRITICAL RESULT CALLED TO, READ BACK BY AND VERIFIED WITH: V [email or web address]  05/28/23  MK    Culture (A)  Final    CELLULOMONAS/MICROBACTERIUM SPECIES Standardized susceptibility testing for this organism is not available. Performed at [facility], [address], [place], [place] [address]    Report Status 06/02/2023 FINAL  Final  Resp panel by RT-PCR (RSV, Flu A&B, Covid) Anterior Nasal Swab     Status: None   Collection Time: 05/25/23  3:58 PM   Specimen: Anterior Nasal Swab  Result Value Ref Range Status   SARS Coronavirus 2 by RT PCR NEGATIVE NEGATIVE Final   Influenza A by PCR NEGATIVE NEGATIVE Final   Influenza B by PCR NEGATIVE NEGATIVE Final    Comment: (NOTE) The Xpert Xpress SARS-CoV-2/FLU/RSV plus assay is intended as an aid in the diagnosis of influenza from Nasopharyngeal swab specimens and should not be used as a sole basis for treatment. Nasal washings and aspirates are unacceptable for Xpert Xpress SARS-CoV-2/FLU/RSV testing.  Fact Sheet for Patients: BloggerCourse.com  Fact Sheet for Healthcare Providers: SeriousBroker.it  This test is not yet approved or cleared by the [place] FDA and has been authorized for detection and/or diagnosis of SARS-CoV-2 by FDA under an Emergency Use Authorization (EUA). This EUA will remain in effect (meaning this test can be used) for the duration of the COVID-19 declaration under Section 564(b)(1) of the Act, 21 U.S.C. section 360bbb-3(b)(1), unless the authorization is terminated or revoked.     Resp Syncytial Virus by PCR NEGATIVE NEGATIVE Final    Comment: (NOTE) Fact Sheet for Patients: BloggerCourse.com  Fact Sheet for Healthcare Providers: SeriousBroker.it  This test is not yet approved or cleared by the [place]  FDA and has been authorized for detection and/or diagnosis of SARS-CoV-2 by FDA under an Emergency Use Authorization (EUA). This EUA will remain in effect (meaning this test can be used) for the duration of the COVID-19 declaration under Section 564(b)(1) of the Act, 21 U.S.C. section 360bbb-3(b)(1), unless the authorization is terminated or revoked.  Performed at [facility], [address] [place], [place] [address]   Culture, blood (Routine X 2) w Reflex to ID Panel     Status: None   Collection Time: 05/28/23  9:19 PM   Specimen: BLOOD RIGHT ARM  Result Value Ref Range Status   Specimen Description BLOOD RIGHT ARM  Final   Special Requests   Final    BOTTLES DRAWN AEROBIC AND ANAEROBIC Blood Culture results may not be optimal due to an inadequate volume of blood received in culture bottles   Culture   Final    NO GROWTH 5 DAYS Performed at [facility], [address] [place], [place] [address]    Report Status 06/02/2023 FINAL  Final  Culture, blood (Routine X 2) w Reflex to ID Panel     Status: None   Collection Time: 05/28/23  9:21 PM   Specimen: BLOOD RIGHT ARM  Result Value Ref Range Status   Specimen Description BLOOD RIGHT ARM  Final   Special Requests   Final    BOTTLES DRAWN AEROBIC AND ANAEROBIC Blood Culture  results may not be optimal due to an inadequate volume of blood received in culture bottles   Culture   Final    NO GROWTH 5 DAYS Performed at [facility], [address] [place], [place] [address]    Report Status 06/02/2023 FINAL  Final  Respiratory (~20 pathogens) panel by PCR     Status: None   Collection Time: 05/29/23  1:21 AM   Specimen: Nasopharyngeal Swab; Respiratory  Result Value Ref Range Status   Adenovirus NOT DETECTED NOT DETECTED Final   Coronavirus 229E NOT DETECTED NOT DETECTED Final    Comment: (NOTE) The Coronavirus on the Respiratory Panel, DOES NOT test for the novel  Coronavirus (2019 nCoV)    Coronavirus HKU1 NOT DETECTED NOT DETECTED Final   Coronavirus NL63 NOT DETECTED NOT DETECTED Final   Coronavirus OC43 NOT DETECTED NOT DETECTED Final   Metapneumovirus NOT DETECTED NOT DETECTED Final   Rhinovirus / Enterovirus NOT DETECTED NOT DETECTED Final   Influenza A NOT DETECTED NOT DETECTED Final   Influenza B NOT DETECTED NOT DETECTED Final   Parainfluenza Virus 1 NOT DETECTED NOT DETECTED Final   Parainfluenza Virus 2 NOT DETECTED NOT DETECTED Final   Parainfluenza Virus 3 NOT DETECTED NOT DETECTED Final   Parainfluenza Virus 4 NOT DETECTED NOT DETECTED Final   Respiratory Syncytial Virus NOT DETECTED NOT DETECTED Final   Bordetella pertussis NOT DETECTED NOT DETECTED Final   Bordetella Parapertussis NOT DETECTED NOT DETECTED Final   Chlamydophila pneumoniae NOT DETECTED NOT DETECTED Final   Mycoplasma pneumoniae NOT DETECTED NOT DETECTED Final    Comment: Performed at [facility], [address], [place], [place] [address]         Radiology Studies: No results found.       Scheduled Meds:  amLODipine   5 mg Oral Daily   aspirin   81 mg Oral Daily   diclofenac  Sodium  4 g Topical QID   fluticasone  furoate-vilanterol  1 puff Inhalation Daily   gabapentin   200 mg Oral TID   heparin   5,000 Units Subcutaneous Q8H   insulin  aspart   0-20 Units Subcutaneous TID WC   insulin  aspart  0-5 Units Subcutaneous QHS   insulin  aspart  14 Units Subcutaneous TID WC   insulin  glargine-yfgn  45 Units Subcutaneous Daily   mirtazapine   30 mg Oral QHS   polyethylene glycol  17 g Oral Daily   [START ON 06/03/2023] predniSONE   40 mg Oral Q breakfast   Followed by   Cecily Cohen ON 06/08/2023] predniSONE   20 mg Oral Q breakfast   rosuvastatin   40 mg Oral Daily   senna-docusate  1 tablet Oral BID   Continuous Infusions:     LOS: 7 days    Time spent: 47 minutes spent on 06/02/2023 caring for this patient face-to-face including chart review, ordering labs/tests, documenting, discussion with nursing staff, consultants, updating family and interview/physical exam    [facility] [place], DO [facility] Available via Epic secure chat 7am-7pm After these hours, please refer to coverage provider listed on [email or web address] 06/02/2023, 11:16 AM

## 2023-06-02 NOTE — Plan of Care (Signed)
   Problem: Clinical Measurements: Goal: Ability to maintain clinical measurements within normal limits will improve Outcome: Progressing

## 2023-06-03 DIAGNOSIS — M79605 Pain in left leg: Secondary | ICD-10-CM | POA: Diagnosis not present

## 2023-06-03 DIAGNOSIS — M79604 Pain in right leg: Secondary | ICD-10-CM | POA: Diagnosis not present

## 2023-06-03 LAB — CBC
HCT: 26.6 % — ABNORMAL LOW (ref 36.0–46.0)
Hemoglobin: 9.2 g/dL — ABNORMAL LOW (ref 12.0–15.0)
MCH: 29.6 pg (ref 26.0–34.0)
MCHC: 34.6 g/dL (ref 30.0–36.0)
MCV: 85.5 fL (ref 80.0–100.0)
Platelets: 290 10*3/uL (ref 150–400)
RBC: 3.11 MIL/uL — ABNORMAL LOW (ref 3.87–5.11)
RDW: 12.1 % (ref 11.5–15.5)
WBC: 17.4 10*3/uL — ABNORMAL HIGH (ref 4.0–10.5)
nRBC: 0 % (ref 0.0–0.2)

## 2023-06-03 LAB — BASIC METABOLIC PANEL WITH GFR
Anion gap: 5 (ref 5–15)
BUN: 79 mg/dL — ABNORMAL HIGH (ref 8–23)
CO2: 22 mmol/L (ref 22–32)
Calcium: 8.8 mg/dL — ABNORMAL LOW (ref 8.9–10.3)
Chloride: 110 mmol/L (ref 98–111)
Creatinine, Ser: 2.3 mg/dL — ABNORMAL HIGH (ref 0.44–1.00)
GFR, Estimated: 21 mL/min — ABNORMAL LOW (ref >60)
Glucose, Bld: 117 mg/dL — ABNORMAL HIGH (ref 70–99)
Potassium: 4.7 mmol/L (ref 3.5–5.1)
Sodium: 137 mmol/L (ref 135–145)

## 2023-06-03 LAB — GLUCOSE, CAPILLARY
Glucose-Capillary: 108 mg/dL — ABNORMAL HIGH (ref 70–99)
Glucose-Capillary: 178 mg/dL — ABNORMAL HIGH (ref 70–99)
Glucose-Capillary: 96 mg/dL (ref 70–99)

## 2023-06-03 MED ORDER — GABAPENTIN 100 MG PO CAPS: 200.0000 mg | ORAL_CAPSULE | Freq: Three times a day (TID) | ORAL | 0 refills | Status: AC

## 2023-06-03 MED ORDER — DICLOFENAC SODIUM 1 % EX GEL: 4.0000 g | Freq: Four times a day (QID) | CUTANEOUS | 1 refills | Status: AC

## 2023-06-03 MED ORDER — POLYETHYLENE GLYCOL 3350 17 G PO PACK: 17.0000 g | PACK | Freq: Every day | ORAL | 0 refills | Status: AC | PRN

## 2023-06-03 MED ORDER — PREDNISONE 20 MG PO TABS: ORAL_TABLET | ORAL | 0 refills | Status: AC

## 2023-06-03 NOTE — Progress Notes (Signed)
 Physical Therapy Treatment Patient Details Name: Nichole Munoz MRN: [id number] DOB: December 07, 1945 Today's Date: 06/03/2023   History of Present Illness Pt is a 78 y.o female admitted from home 5/19 for bilateral leg pain. Negative for DVT, UTI. MRI brain and spine showed no acute trauma, mild to moderate spinal stenosis. Recent admission 01/2023 for syncope, 4/28-5/2 encephalopathy  PMH: CVA with residual L sided weakness, CKD, HTN, asthma, neuropathy, DM, diverticulitiss, C7 fx 2018    PT Comments  Pt received in supine and agreeable to session. Family reports plan to take pt home, so session focused on family training and progressing gait. Pt demonstrates impulsivity with mobility requiring increased assist and cues for safety. Pt able to progress gait distance, however demonstrates ataxia, L inattention, and RW pushed far in front of BOS causing increased instability. Education and handout provided on navigating a curb step with a w/c due to 1 step to enter their home. Pt and family advised to primarily use w/c initially for safety due to instabilty. Pt continues to benefit from PT services to progress toward functional mobility goals.    If plan is discharge home, recommend the following: Two people to help with walking and/or transfers;A lot of help with bathing/dressing/bathroom;Assistance with cooking/housework;Direct supervision/assist for medications management;Help with stairs or ramp for entrance   Can travel by private vehicle     No  Equipment Recommendations  Wheelchair (measurements PT)    Recommendations for Other Services       Precautions / Restrictions Precautions Precautions: Fall Recall of Precautions/Restrictions: Impaired Precaution/Restrictions Comments: HoH Restrictions Weight Bearing Restrictions Per Provider Order: No     Mobility  Bed Mobility Overal bed mobility: Needs Assistance Bed Mobility: Supine to Sit     Supine to sit: Contact guard, HOB elevated      General bed mobility comments: cues for safety    Transfers Overall transfer level: Needs assistance Equipment used: Rolling walker (2 wheels) Transfers: Sit to/from Stand Sit to Stand: Contact guard assist           General transfer comment: cues for hand placement and safety due to pt attempting to stand without RW    Ambulation/Gait Ambulation/Gait assistance: Min assist, +2 safety/equipment Gait Distance (Feet): 50 Feet Assistive device: Rolling walker (2 wheels) Gait Pattern/deviations: Ataxic, Trunk flexed, Drifts right/left       General Gait Details: Pt demonstrating quick pace and progressively flexed trunk with RW pushed far outside BOS. Pt able to place feet inside RW with cues, however does not maintain. Pt demonstrates ataxia with steps and UE support on RW and L inattention with pt drifting into the wall requiring assist and cues to correct   Stairs             Wheelchair Mobility     Tilt Bed    Modified Rankin (Stroke Patients Only)       Balance Overall balance assessment: Needs assistance Sitting-balance support: Feet supported Sitting balance-Leahy Scale: Fair Sitting balance - Comments: supervision at EOB   Standing balance support: Bilateral upper extremity supported, Reliant on assistive device for balance, During functional activity Standing balance-Leahy Scale: Poor Standing balance comment: RW and external support                            Communication Communication Communication: Impaired Factors Affecting Communication: Non - English speaking, interpreter not available  Cognition Arousal: Alert Behavior During Therapy: Anxious, Impulsive   PT - Cognitive  impairments: Difficult to assess Difficult to assess due to: Non-English speaking                       Following commands: Impaired Following commands impaired: Follows one step commands inconsistently, Follows one step commands with increased  time    Cueing Cueing Techniques: Verbal cues, Gestural cues, Tactile cues, Visual cues  Exercises      General Comments General comments (skin integrity, edema, etc.): Family present and requesting to interpret. Family reports they have decided to take pt home instead of SNF. Education provided on curb with w/c and handout provided as well as need for 24/7 supervision for safety      Pertinent Vitals/Pain Pain Assessment Pain Assessment: Faces Faces Pain Scale: Hurts a little bit Pain Location: BLE (knees/legs) with standing Pain Descriptors / Indicators: Discomfort, Grimacing, Guarding Pain Intervention(s): Monitored during session, Limited activity within patient's tolerance    PT Goals (current goals can now be found in the care plan section) Acute Rehab PT Goals Patient Stated Goal: Less pain in my legs PT Goal Formulation: Patient unable to participate in goal setting Time For Goal Achievement: 06/11/23 Progress towards PT goals: Progressing toward goals    Frequency    Min 2X/week      PT Plan      Co-evaluation PT/OT/SLP Co-Evaluation/Treatment: Yes Reason for Co-Treatment: Complexity of the patient's impairments (multi-system involvement);To address functional/ADL transfers;For patient/therapist safety;Necessary to address cognition/behavior during functional activity PT goals addressed during session: Mobility/safety with mobility;Proper use of DME;Balance        AM-PAC PT "6 Clicks" Mobility   Outcome Measure  Help needed turning from your back to your side while in a flat bed without using bedrails?: A Little Help needed moving from lying on your back to sitting on the side of a flat bed without using bedrails?: A Little Help needed moving to and from a bed to a chair (including a wheelchair)?: A Little Help needed standing up from a chair using your arms (e.g., wheelchair or bedside chair)?: A Little Help needed to walk in hospital room?: A Lot  (safety) Help needed climbing 3-5 steps with a railing? : Total 6 Click Score: 15    End of Session Equipment Utilized During Treatment: Gait belt Activity Tolerance: Patient tolerated treatment well Patient left: in chair;with call bell/phone within reach;with family/visitor present Nurse Communication: Mobility status PT Visit Diagnosis: Unsteadiness on feet (R26.81);Difficulty in walking, not elsewhere classified (R26.2)     Time: 0981-1914 PT Time Calculation (min) (ACUTE ONLY): 23 min  Charges:    $Gait Training: 8-22 mins PT General Charges $$ ACUTE PT VISIT: 1 Visit                     Michaelle Adolphus, PTA Acute Rehabilitation Services Secure Chat Preferred  [phone]    Michaelle Adolphus 06/03/2023, 4:15 PM

## 2023-06-03 NOTE — TOC Progression Note (Addendum)
 Transition of Care [facility]) - Progression Note    Patient Details  Name: Nichole Munoz MRN: [id number] Date of Birth: 1945/06/30  [facility] CM/SW Contact  Elspeth Hals, LCSW Phone Number: 06/03/2023, 12:07 PM  Clinical Narrative:   New phone numbers available for son and daughter, face sheet updated.   CSW spoke with pt through status interpretor regarding SNF bed offers, not having much success with communicating SNF choice.  CSW able to reach son Thorn by phone and included him in the conversation with stratus still available.  After they talked, decision made to accept offer at [facility].  Son will be available for paperwork this afternoon.    1130: CSW spoke with [facility] and she came by for [facility] meeting with pt.  She will need to discuss with her admin due to the language barrier.   1400: [facility] confirms they can receive pt with auth.  Auth request submitted in [place] and approved: [id number], 3 days: 5/28-5/30.   CSW spoke with [facility]: can receive pt tomorrow, but not today.  MD informed.   1530: CSW informed son and daughter are in the room, went to update them. Son [name] now saying he does not want pt to go to SNF and wants to take her home with [facility].  Reports he and his sister will take shifts to make sure she is cared for.  CSW confirmed several times.  DME discussed: pt has rollator, wants 3n1.  RNCM/MD informed.    Expected Discharge Plan:  (TBD) Barriers to Discharge: Continued Medical Work up, Other (must enter comment) (unclear DC plan at this time, attempting to reach family members)  Expected Discharge Plan and Services In-house Referral: Clinical Social Work     Living arrangements for the past 2 months: Single Family Home                                       Social Determinants of Health (SDOH) Interventions SDOH Screenings   Food Insecurity: No Food Insecurity (05/26/2023)  Housing: Low Risk  (05/26/2023)   Transportation Needs: No Transportation Needs (05/26/2023)  Utilities: Not At Risk (05/26/2023)  Financial Resource Strain: Not on File (04/25/2021)   Received from [place], [place]  Physical Activity: Not on File (04/25/2021)   Received from [place], [place]  Social Connections: Moderately Isolated (05/26/2023)  Stress: Not on File (04/25/2021)   Received from [facility], [place]  Tobacco Use: Low Risk  (05/25/2023)    Readmission Risk Interventions     No data to display

## 2023-06-03 NOTE — Progress Notes (Signed)
    Durable Medical Equipment  (From admission, onward)           Start     Ordered   06/03/23 1602  For home use only DME standard manual wheelchair with seat cushion  Once       Comments: Patient suffers from CVA with residual left-sided weakness which impairs their ability to perform daily activities like bathing in the home.  A walker will not resolve issue with performing activities of daily living. A wheelchair will allow patient to safely perform daily activities. Patient can safely propel the wheelchair in the home or has a caregiver who can provide assistance. Length of need Lifetime. Accessories: elevating leg rests (ELRs), wheel locks, extensions and anti-tippers.   06/03/23 1601   06/03/23 1533  For home use only DME Bedside commode  Once       Comments: Confine to one room  Question:  Patient needs a bedside commode to treat with the following condition  Answer:  Weakness   06/03/23 1533

## 2023-06-03 NOTE — TOC Transition Note (Addendum)
 Transition of Care [facility]) - Discharge Note   Patient Details  Name: Nichole Munoz MRN: [id number] Date of Birth: Mar 26, 1945  [facility]) CM/SW Contact:  Alisa App, RN Phone Number: 06/03/2023, 4:22 PM   Clinical Narrative:    Patient will DC to: son Nichole Munoz's home  Anticipated DC date: 06/03/2023 Family notified: yes Transport by: car   Per MD patient ready for DC today. RN, patient, and  patient's family (son/daughter), aware of DC. Son declined SNF placement. Pt to transition to son's home, [address],  [address], [place] [address].(Cell# [phone]). Son agreeable to home health services without provider preference.Referral made with [facility] and accepted.  Referral made with [employer] for DME , W/C and BSC . Equipment will be delivered to bedside prior to d/c. Son to pick up RX meds from local pharmacy. Post hospital f/u noted on AVS. Son to provide transportation to home.  RNCM will sign off for now as intervention is no longer needed. Please consult us  again if new needs arise.    Final next level of care: Home w Home Health Services Barriers to Discharge: No Barriers Identified   Patient Goals and CMS Choice     Choice offered to / list presented to : Adult Children (Son, Scientist, physiological)      Discharge Placement                       Discharge Plan and Services Additional resources added to the After Visit Summary for   In-house Referral: Clinical Social Work              DME Arranged: Arts development officer, Wheelchair manual DME Agency: [employer] Date DME Agency Contacted: 06/03/23 Time DME Agency Contacted: 1610   HH Arranged: PT, OT HH Agency: [facility] Date HH Agency Contacted: 06/03/23 Time HH Agency Contacted: 1610 Representative spoke with at [facility] Agency: Imelda Man  Social Drivers of Health (SDOH) Interventions SDOH Screenings   Food Insecurity: No Food Insecurity (05/26/2023)  Housing: Low Risk  (05/26/2023)   Transportation Needs: No Transportation Needs (05/26/2023)  Utilities: Not At Risk (05/26/2023)  Financial Resource Strain: Not on File (04/25/2021)   Received from [place], [place]  Physical Activity: Not on File (04/25/2021)   Received from [place], [place]  Social Connections: Moderately Isolated (05/26/2023)  Stress: Not on File (04/25/2021)   Received from [facility], [place]  Tobacco Use: Low Risk  (05/25/2023)     Readmission Risk Interventions     No data to display

## 2023-06-03 NOTE — Plan of Care (Signed)
  Problem: Education: Goal: Knowledge of General Education information will improve Description: Including pain rating scale, medication(s)/side effects and non-pharmacologic comfort measures Outcome: Progressing   Problem: Health Behavior/Discharge Planning: Goal: Ability to manage health-related needs will improve Outcome: Progressing   Problem: Clinical Measurements: Goal: Ability to maintain clinical measurements within normal limits will improve Outcome: Progressing Goal: Diagnostic test results will improve Outcome: Progressing Goal: Cardiovascular complication will be avoided Outcome: Progressing   Problem: Activity: Goal: Risk for activity intolerance will decrease Outcome: Progressing   Problem: Coping: Goal: Level of anxiety will decrease Outcome: Progressing   Problem: Elimination: Goal: Will not experience complications related to urinary retention Outcome: Progressing   Problem: Pain Managment: Goal: General experience of comfort will improve and/or be controlled Outcome: Progressing   Problem: Safety: Goal: Ability to remain free from injury will improve Outcome: Progressing   Problem: Skin Integrity: Goal: Risk for impaired skin integrity will decrease Outcome: Progressing   Problem: Education: Goal: Ability to describe self-care measures that may prevent or decrease complications (Diabetes Survival Skills Education) will improve Outcome: Progressing Goal: Individualized Educational Video(s) Outcome: Progressing   Problem: Coping: Goal: Ability to adjust to condition or change in health will improve Outcome: Progressing   Problem: Fluid Volume: Goal: Ability to maintain a balanced intake and output will improve Outcome: Progressing   Problem: Health Behavior/Discharge Planning: Goal: Ability to identify and utilize available resources and services will improve Outcome: Progressing Goal: Ability to manage health-related needs will  improve Outcome: Progressing   Problem: Metabolic: Goal: Ability to maintain appropriate glucose levels will improve Outcome: Progressing   Problem: Nutritional: Goal: Maintenance of adequate nutrition will improve Outcome: Progressing Goal: Progress toward achieving an optimal weight will improve Outcome: Progressing   Problem: Skin Integrity: Goal: Risk for impaired skin integrity will decrease Outcome: Progressing   Problem: Tissue Perfusion: Goal: Adequacy of tissue perfusion will improve Outcome: Progressing

## 2023-06-03 NOTE — Discharge Planning (Signed)
 Patient alert. IV access removed. Discharge teaching given to patient's son Thorn. Thorn verbalized understanding of teaching including medications. Discharge summary placed in discharge packet and placed with patient belongings. Patient will be transported home by her son.

## 2023-06-03 NOTE — Discharge Summary (Signed)
 Physician Discharge Summary  Stephen Turnbaugh ZOX:[id number] DOB: 1945-09-21 DOA: 05/25/2023  PCP: [facility]  Admit date: 05/25/2023 Discharge date: 06/03/2023  Admitted From: Home Disposition: Home  Recommendations for Outpatient Follow-up:  Follow up with PCP in 1 week  Outpatient follow-up with neurosurgery Follow up in ED if symptoms worsen or new appear   Home Health: Home health PT Equipment/Devices: None  Discharge Condition: Stable CODE STATUS: Full Diet recommendation: Heart healthy/carb modified  Brief/Interim Summary: 78 y.o. 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  On presentation, temperature was 100.1 with creatinine of 2.55.  COVID/influenza/RSV PCR negative.  MRI of brain without contrast did not show any acute intracranial abnormity MRI C-spine showed no evidence for acute traumatic injury, moderate right paracentral disc extrusion with superior migration C7-T1 with mild to moderate spinal stenosis with multilevel cervical spine spondylosis with mild to moderate diffuse spinal stenosis C3-4 through C6-7 MRI T-spine showed grossly normal appearance of the thoracic spinal cord.  MRI lumbar spine showed moderate spinal stenosis L2-L3, L3-L4, moderate to severe foraminal narrowing L2-3, L3-4.  Patient was initially started on broad-spectrum antibiotics.  Neurosurgery was consulted.  Subsequently, patient has remained on afebrile and has completed antibiotic course.  PT recommending SNF placement.  Family denied SNF placement.  She will be discharged home with home health today.  Outpatient follow-up with neurosurgery.  Discharge Diagnoses:   Bilateral lower extremity pain with radiculopathy Ambulatory  dysfunction History of CVA with residual left-sided weakness Multilevel spinal stenosis in cervical, thoracic and lumbar spine -Orthopedics recommended no need for any orthopedic intervention and thought that her symptoms were radicular in nature - Neurology/Dr. Michale Age tried to evaluate the patient on 05/28/2023 but he had trouble obtaining history due to language barrier.  Patient will possibly need outpatient neurosurgical evaluation. -Currently on tapering prednisone .  Continue tapering prednisone  on discharge: Prednisone  40 mg daily for another 4 days followed by 20 mg daily for 5 days afterwards then stop.  Continue gabapentin  along with Voltaren  gel and as needed Tylenol . -PT OT recommending SNF placement.   Family denied SNF placement.  She will be discharged home with home health today.  Outpatient follow-up with neurosurgery.   Fever, unclear etiology Gram-positive rods bacteremia - 1 set of blood culture on admission grew cell cellulomonas/Microbacterium species: Possibly contaminant.  Repeat blood cultures have been negative so far - Patient has completed a course of antibiotic therapy and antibiotics discontinued on 06/01/2023.  Currently afebrile.  Monitoring off of antibiotics.   Acute renal failure on CKD stage IIIb Acute metabolic acidosis: Resolved - Creatinine peaked to 2.81.  Improving to 2.30 today.  Possibly close to baseline.  Monitor intermittently as an outpatient   Leukocytosis - Possibly steroid mediated.  Monitor intermittently as an outpatient   Hyponatremia -Resolved   Anemia of chronic disease - From chronic illnesses and renal failure.  Hemoglobin currently stable.  Monitor intermittently.  No signs of bleeding   Diabetes mellitus type 2 with hyperglycemia -carb modified diet.  Outpatient follow-up with PCP  Hypertension Hyperlipidemia -Continue amlodipine  and statin.  Outpatient follow-up   Abnormal MRI: Incidental finding -MRI showed Loss of normal  flow void within the right vertebral artery, which could reflect slow flow and/or occlusion. Discussed with Neurology, Dr.  Bhagat by Dr. Bobbetta Burnet; patient leg symptoms are not explained by MRI finding and felt to be incidental    Asthma - Stable.  Continue current inhaled regimen  Discharge Instructions  Discharge Instructions     Diet - low sodium heart healthy   Complete by: As directed    Diet Carb Modified   Complete by: As directed    Increase activity slowly   Complete by: As directed       Allergies as of 06/03/2023       Reactions   Chicken Allergy Shortness Of Breath   Sodium Hypochlorite Shortness Of Breath   Clorox         Medication List     TAKE these medications    albuterol  108 (90 Base) MCG/ACT inhaler Commonly known as: VENTOLIN  HFA Inhale 2 puffs into the lungs every 6 (six) hours as needed for wheezing or shortness of breath.   amLODipine  5 MG tablet Commonly known as: NORVASC  Take 1 tablet (5 mg total) by mouth daily.   Aspirin  Low Dose 81 MG chewable tablet Generic drug: aspirin  Chew 1 tablet (81 mg total) by mouth daily.   Breo Ellipta  200-25 MCG/ACT Aepb Generic drug: fluticasone  furoate-vilanterol Inhale 1 puff into the lungs daily.   diclofenac  Sodium 1 % Gel Commonly known as: VOLTAREN  Apply 4 g topically 4 (four) times daily.   gabapentin  100 MG capsule Commonly known as: NEURONTIN  Take 2 capsules (200 mg total) by mouth 3 (three) times daily.   mirtazapine  30 MG tablet Commonly known as: REMERON  Take 1 tablet (30 mg total) by mouth at bedtime.   polyethylene glycol 17 g packet Commonly known as: MIRALAX  / GLYCOLAX  Take 17 g by mouth daily as needed.   predniSONE  20 MG tablet Commonly known as: DELTASONE  40mg  daily for 4 days then 20mg  daily for 5 days then stop Start taking on: Jun 04, 2023   rosuvastatin  40 MG tablet Commonly known as: CRESTOR  Take 1 tablet (40 mg total) by mouth daily.               Durable  Medical Equipment  (From admission, onward)           Start     Ordered   06/03/23 1602  For home use only DME standard manual wheelchair with seat cushion  Once       Comments: Patient suffers from CVA with residual left-sided weakness which impairs their ability to perform daily activities like bathing in the home.  A walker will not resolve issue with performing activities of daily living. A wheelchair will allow patient to safely perform daily activities. Patient can safely propel the wheelchair in the home or has a caregiver who can provide assistance. Length of need Lifetime. Accessories: elevating leg rests (ELRs), wheel locks, extensions and anti-tippers.   06/03/23 1601   06/03/23 1533  For home use only DME Bedside commode  Once       Comments: Confine to one room  Question:  Patient needs a bedside commode to treat with the following condition  Answer:  Weakness   06/03/23 1533            Follow-up Information     [facility] Follow up.   Specialty: Pediatrics Contact information: [address] [place] [address] [phone]         [facility] Follow up.   Specialty: Home Health Services Why: home health PT and OT  services will be provided by [facility], start of care within 48 hours post discharge Contact information: [address] [place] [address] [phone]         Audie Bleacher, MD. Schedule an appointment as soon as possible for a visit in 1 week(s).   Specialty: Neurosurgery Contact information: [address] [place] [address] [phone]                Allergies  Allergen Reactions   Chicken Allergy Shortness Of Breath   Sodium Hypochlorite Shortness Of Breath    Clorox     Consultations: Orthopedics. Dr. Bobbetta Burnet discussed with neurology, Dr. Cleone Dad regarding abnormal MRI findings.  Neurosurgery/Dr. Michale Age: Evaluation still pending     Procedures/Studies: VAS US  ABI WITH/WO TBI Result Date: 05/29/2023  LOWER EXTREMITY DOPPLER STUDY Patient Name:  Jaidence Templeman  Date of Exam:   05/28/2023 Medical Rec #: [id number]  Accession #:    [id number] Date of Birth: 07-10-1945   Patient Gender: F Patient Age:   34 years Exam Location:  [facility] Procedure:      VAS US  ABI WITH/WO TBI Referring Phys: --------------------------------------------------------------------------------  Indications: Bilateral leg and foot pain/tenderness to touch from knees down High Risk         Hypertension, Diabetes, no history of smoking, prior CVA Factors:          04/2019. Other Factors: Neuropathy.  Limitations: Today's exam was limited due to involuntary patient movement and              tachycardia. Comparison Study: No prior study on file Performing Technologist: Carleene Chase RVS  Examination Guidelines: A complete evaluation includes at minimum, Doppler waveform signals and systolic blood pressure reading at the level of bilateral brachial, anterior tibial, and posterior tibial arteries, when vessel segments are accessible. Bilateral testing is considered an integral part of a complete examination. Photoelectric Plethysmograph (PPG) waveforms and toe systolic pressure readings are included as required and additional duplex testing as needed. Limited examinations for reoccurring indications may be performed as noted.  ABI Findings: +---------+------------------+-----+-----------+--------+ Right    Rt Pressure (mmHg)IndexWaveform   Comment  +---------+------------------+-----+-----------+--------+ Brachial 166                    triphasic           +---------+------------------+-----+-----------+--------+ PTA      151               0.91 multiphasic         +---------+------------------+-----+-----------+--------+ DP       160               0.96 multiphasic         +---------+------------------+-----+-----------+--------+ Great Toe75                 0.45                     +---------+------------------+-----+-----------+--------+ +---------+------------------+-----+-----------+-------------------------------+ Left     Lt Pressure (mmHg)IndexWaveform   Comment                         +---------+------------------+-----+-----------+-------------------------------+ Brachial 154                    triphasic                                  +---------+------------------+-----+-----------+-------------------------------+  PTA      111               0.67 multiphasicpressure may not be accurate                                               secondary to patient movement   +---------+------------------+-----+-----------+-------------------------------+ DP       135               0.81 multiphasic                                +---------+------------------+-----+-----------+-------------------------------+ Great Toe92                0.55                                            +---------+------------------+-----+-----------+-------------------------------+ +-------+-----------+-----------+------------+------------+ ABI/TBIToday's ABIToday's TBIPrevious ABIPrevious TBI +-------+-----------+-----------+------------+------------+ Right  0.96       0.45                                +-------+-----------+-----------+------------+------------+ Left   0.81       0.55                                +-------+-----------+-----------+------------+------------+  Summary: Right: Resting right ankle-brachial index is within normal range. The right toe-brachial index is abnormal. Left: Resting left ankle-brachial index indicates mild left lower extremity arterial disease. The left toe-brachial index is abnormal. *See table(s) above for measurements and observations.  Electronically signed by Delaney Fearing on 05/29/2023 at 8:19:51 AM.    Final    DG Chest Port 1 View Result Date: 05/28/2023 CLINICAL DATA:  Fever.  EXAM: PORTABLE CHEST 1 VIEW COMPARISON:  05/25/2023. FINDINGS: The heart size and mediastinal contours are stable. There is atherosclerotic calcification of the aorta. Chronic elevation of right diaphragm is noted. No consolidation, effusion, or pneumothorax is noted. No acute osseous abnormality is seen. IMPRESSION: No active disease. Electronically Signed   By: Wyvonnia Heimlich M.D.   On: 05/28/2023 21:34   MR Lumbar Spine W Wo Contrast Result Date: 05/27/2023 EXAM: MR Lumbar Spine with and without intravenous contrast. 05/27/2023 06:00:03 PM TECHNIQUE: Multiplanar multisequence MRI of the lumbar spine was performed with and without the administration of intravenous contrast. COMPARISON: Lumbar spine radiographs 07/05/2016. CLINICAL HISTORY: Brain/CNS neoplasm, monitor; temperature elevated, severe leg pain, difficulty walking. FINDINGS: BONES AND ALIGNMENT: Mild leftward curvature is centered at L2-3. Straightening of the normal lumbar lordosis is stable. At L5 to S1, grade 1 anterolisthesis measuring 9 mm is similar to the prior exam. Type II Modic changes are noted anteriorly at L1-2 and anteriorly on the left at L5 to S1. SPINAL CORD: The conus terminates normally. SOFT TISSUES: No abnormal enhancement is seen of the lumbar spine. No paraspinal mass identified. L1-L2: A leftward disc protrusion results in mild left subarticular narrowing. Moderate foraminal stenosis is worse left than right. L2-L3: A broad-based disc protrusion and moderate facet hypertrophy result in moderate central canal stenosis with subarticular narrowing bilaterally. Severe right and moderate left  foraminal stenosis is present. L3-L4: A leftward disc protrusion is present. Moderate facet hypertrophy is noted bilaterally. Moderate left and mild right subarticular stenosis is present. Moderate to severe foraminal narrowing is present bilaterally, left greater than right. L4-L5: A leftward disc protrusion is present. Moderate left and mild  right subarticular narrowing is present. Facet hypertrophy contributes bilaterally. Severe left and moderate right foraminal stenosis is present. A far right annular tear is noted. L5-S1: There is uncovertebral joint arthrosis with a broad-based disc protrusion. Bilateral pars defects result in severe foraminal narrowing bilaterally, left greater than right. The central canal is patent. IMPRESSION: 1. L1-2: Leftward disc protrusion with mild left subarticular narrowing and moderate foraminal stenosis, worse left than right. 2. L2-3: Broad-based disc protrusion and moderate facet hypertrophy resulting in moderate central canal stenosis with subarticular narrowing bilaterally. Severe right and moderate left foraminal stenosis. 3. L3-4: Leftward disc protrusion and moderate facet hypertrophy bilaterally. Moderate left and mild right subarticular stenosis. Moderate to severe foraminal narrowing bilaterally, left greater than right. 4. L4-5: Leftward disc protrusion and facet hypertrophy bilaterally. Moderate left and mild right subarticular narrowing. Severe left and moderate right foraminal stenosis. Far right annular tear. 5. L5-S1: Uncovertebral joint arthrosis, broad-based disc protrusion, and bilateral pars defects resulting in severe foraminal narrowing bilaterally, left greater than right. Central canal is patent. Electronically signed by: Audree Leas MD 05/27/2023 07:02 PM EDT RP Workstation: [id number]   DG Knee 1-2 Views Right Result Date: 05/27/2023 CLINICAL DATA:  Right knee pain. EXAM: RIGHT KNEE - 1-2 VIEW COMPARISON:  None Available. FINDINGS: There is no acute fracture or dislocation. The bones are osteopenic. There is arthritic changes of the right knee with severe narrowing of the medial compartment. There is a small suprapatellar effusion. Mild soft tissue swelling of the knee. IMPRESSION: 1. No acute fracture or dislocation. 2. Arthritic changes of the right knee and a small suprapatellar  effusion. Electronically Signed   By: Angus Bark M.D.   On: 05/27/2023 14:40   DG Knee 1-2 Views Left Result Date: 05/27/2023 CLINICAL DATA:  Pain, knee [id number].  Bilateral knee pain. EXAM: LEFT KNEE - 1-2 VIEW COMPARISON:  Right knee 05/27/2023 FINDINGS: Left knee is located without a fracture. Small suprapatellar joint effusion. Alignment is within normal limits. Mild to moderate joint space narrowing in the medial knee compartment with osteophytosis. IMPRESSION: 1. No acute bone abnormality to the left knee. 2. Small joint effusion. 3. 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 Electronically Signed   By: Elene Griffes M.D.   On: 05/27/2023 14:34   VAS US  LOWER EXTREMITY VENOUS (DVT) (7a-7p) Result Date: 05/26/2023  Lower Venous DVT Study Patient Name:  TAGEN Verba  Date of Exam:   05/25/2023 Medical Rec #: [id number]  Accession #:    [id number] Date of Birth: 15-May-1945   Patient Gender: F Patient Age:   31 years Exam Location:  [facility] Procedure:      VAS US  LOWER EXTREMITY VENOUS (DVT) Referring Phys: Scarlette Currier --------------------------------------------------------------------------------  Indications: Pain.  Comparison Study: Previous exam on 04/24/2019 was negative for DVT Performing Technologist: Arlyce Berger RVT, RDMS  Examination Guidelines: A complete evaluation includes B-mode imaging, spectral Doppler, color Doppler, and power Doppler as needed of all accessible portions of each vessel. Bilateral testing is considered an integral part of a complete examination. Limited examinations for reoccurring indications may be performed as noted. The reflux portion of the exam is performed with the patient in reverse Trendelenburg.  +---------+---------------+---------+-----------+----------+--------------+ RIGHT  CompressibilityPhasicitySpontaneityPropertiesThrombus Aging +---------+---------------+---------+-----------+----------+--------------+ CFV      Full           Yes       Yes                                 +---------+---------------+---------+-----------+----------+--------------+ SFJ      Full                                                        +---------+---------------+---------+-----------+----------+--------------+ FV Prox  Full           Yes      Yes                                 +---------+---------------+---------+-----------+----------+--------------+ FV Mid   Full           Yes      Yes                                 +---------+---------------+---------+-----------+----------+--------------+ FV DistalFull           Yes      Yes                                 +---------+---------------+---------+-----------+----------+--------------+ PFV      Full                                                        +---------+---------------+---------+-----------+----------+--------------+ POP      Full           Yes      Yes                                 +---------+---------------+---------+-----------+----------+--------------+ PTV      Full                                                        +---------+---------------+---------+-----------+----------+--------------+ PERO     Full                                                        +---------+---------------+---------+-----------+----------+--------------+   +---------+---------------+---------+-----------+----------+--------------+ LEFT     CompressibilityPhasicitySpontaneityPropertiesThrombus Aging +---------+---------------+---------+-----------+----------+--------------+ CFV      Full           Yes      Yes                                 +---------+---------------+---------+-----------+----------+--------------+ SFJ  Full                                                        +---------+---------------+---------+-----------+----------+--------------+ FV Prox  Full           Yes      Yes                                  +---------+---------------+---------+-----------+----------+--------------+ FV Mid   Full           Yes      Yes                                 +---------+---------------+---------+-----------+----------+--------------+ FV DistalFull           Yes      Yes                                 +---------+---------------+---------+-----------+----------+--------------+ PFV      Full                                                        +---------+---------------+---------+-----------+----------+--------------+ POP      Full           Yes      Yes                                 +---------+---------------+---------+-----------+----------+--------------+ PTV      Full                                                        +---------+---------------+---------+-----------+----------+--------------+ PERO     Full                                                        +---------+---------------+---------+-----------+----------+--------------+     Summary: BILATERAL: - No evidence of deep vein thrombosis seen in the lower extremities, bilaterally. - RIGHT: - No cystic structure found in the popliteal fossa.  LEFT: - A cystic structure is found in the popliteal fossa (4.17 x 0.83 x 3.32 cm).  *See table(s) above for measurements and observations. Electronically signed by Genny Kid MD on 05/26/2023 at 7:06:57 PM.    Final    MR THORACIC SPINE WO CONTRAST Result Date: 05/25/2023 CLINICAL DATA:  Initial evaluation for acute trauma, spinal cord injury. EXAM: MRI THORACIC SPINE WITHOUT CONTRAST TECHNIQUE: Multiplanar, multisequence MR imaging of the thoracic spine was performed. No intravenous contrast was administered. COMPARISON:  None Available. FINDINGS: Alignment: Examination technically limited as the patient was unable to tolerate the full length of the  exam. No axial images were obtained. Straightening of the normal thoracic kyphosis.  No listhesis. Vertebrae: Vertebral body  height maintained without acute or chronic fracture. Bone marrow signal intensity overall within normal limits. No worrisome osseous lesions. Degenerative reactive endplate changes noted within the visualized upper lumbar spine at L2-3 and L3-4. Cord: Grossly normal signal morphology. No visible cord signal changes on this technically limited exam. Paraspinal and other soft tissues: Grossly unremarkable on this limited exam. Disc levels: Ordinary for age disc desiccation noted throughout the thoracic spine. No significant disc bulge or focal disc herniation within the thoracic spine itself. No significant spinal stenosis or neural foraminal encroachment within the cervical spine. Degenerative disc bulging and facet hypertrophy noted within the upper lumbar spine at L2-3 and L3-4 with up to moderate spinal stenosis at L3-4, with moderate to severe bilateral foraminal narrowing at L2-3 and L3-4. IMPRESSION: 1. Technically limited exam due to the patient's inability to tolerate the full length of the study. 2. Grossly normal MRI appearance of the thoracic spinal cord on this limited exam. No visible acute traumatic injury. 3. Degenerative disc bulging and facet hypertrophy within the upper lumbar spine at L2-3 and L3-4 with up to moderate spinal stenosis at L3-4, with moderate to severe bilateral foraminal narrowing at L2-3 and L3-4. Electronically Signed   By: Virgia Griffins M.D.   On: 05/25/2023 19:03   MR CERVICAL SPINE WO CONTRAST Result Date: 05/25/2023 CLINICAL DATA:  Initial evaluation for acute neck trauma. EXAM: MRI CERVICAL SPINE WITHOUT CONTRAST TECHNIQUE: Multiplanar, multisequence MR imaging of the cervical spine was performed. No intravenous contrast was administered. COMPARISON:  Prior CT from 06/03/2021 FINDINGS: Alignment: Examination mildly degraded by motion artifact. Straightening of the normal cervical lordosis. Trace degenerative retrolisthesis of C4 on C5 and C5 on C6, with trace  anterolisthesis of C6 on C7. Vertebrae: Vertebral body height maintained without acute or chronic fracture. Bone marrow signal intensity within normal limits. No worrisome osseous lesions. Mild degenerative reactive endplate changes present about the C5-6 and C6-7 interspaces. No other abnormal marrow edema. Cord: Grossly normal signal morphology. No convincing cord signal abnormality on this somewhat motion degraded exam. Posterior Fossa, vertebral arteries, paraspinal tissues: Craniocervical junction within normal limits. Paraspinous soft tissues demonstrate no acute finding. Loss of normal flow void within the right vertebral artery, which could reflect slow flow and/or occlusion. Preserved flow void within the contralateral left vertebral artery. Few small thyroid  nodules measuring up to 1.2 cm noted about significance given size and patient age, no follow-up imaging recommended (ref: J Am Coll Radiol. 2015 Feb;12(2): 143-50). Disc levels: C2-C3: Negative interspace. No spinal stenosis. Foramina remain patent. C3-C4: Central disc osteophyte complex flattens and indents the ventral thecal sac. Minimal cord flattening without cord signal changes. Mild left-sided facet hypertrophy. Resultant mild-to-moderate spinal stenosis. Superimposed uncovertebral spurring with moderate to severe left and mild right C4 foraminal stenosis. C4-C5: Diffuse disc bulge with bilateral uncovertebral spurring. Flattening and partial effacement of the ventral thecal sac. Mild cord flattening without convincing cord signal changes. Mild left-sided facet hypertrophy. Resultant moderate spinal stenosis. Moderate to severe bilateral C5 foraminal narrowing. C5-C6: Mild degenerative vertebral disc space narrowing with diffuse disc osteophyte complex. Posterior component flattens and partially faces the ventral thecal sac, asymmetric to the right. Secondary cord flattening without convincing cord signal changes. Moderate spinal stenosis with  moderate to severe bilateral C6 foraminal narrowing. C6-C7: Disc bulge with bilateral uncovertebral spurring. Left greater than right facet hypertrophy. Mild spinal stenosis. Severe left  C7 foraminal narrowing. Right neural foramina remains adequately patent. C7-T1: Moderate-size right paracentral disc extrusion with superior migration (series 8, image 35). Mild cord flattening without cord signal changes. Mild to moderate spinal stenosis. Foramina remain patent. IMPRESSION: 1. No MRI evidence for acute traumatic injury within the cervical spine. 2. Moderate-sized right paracentral disc extrusion with superior migration at C7-T1 with resultant mild to moderate spinal stenosis. 3. Additional multilevel cervical spondylosis with resultant mild to moderate diffuse spinal stenosis at C3-4 through C6-7. 4. Multifactorial degenerative changes with resultant multilevel foraminal narrowing as above. Notable findings include moderate to severe left C4 and bilateral C5 and C6 foraminal narrowing, with severe left C7 foraminal stenosis. 5. Loss of normal flow void within the right vertebral artery, which could reflect slow flow and/or occlusion. Electronically Signed   By: Virgia Griffins M.D.   On: 05/25/2023 18:56   MR BRAIN WO CONTRAST Result Date: 05/25/2023 CLINICAL DATA:  Follow-up examination for stroke. EXAM: MRI HEAD WITHOUT CONTRAST TECHNIQUE: Multiplanar, multiecho pulse sequences of the brain and surrounding structures were obtained without intravenous contrast. COMPARISON:  CT from 05/04/2023. FINDINGS: Brain: Age-related cerebral atrophy. Patchy T2/FLAIR hyperintensity involving the periventricular and deep white matter, consistent with chronic small vessel ischemic disease, mild in nature. Few small remote lacunar infarcts present about the bilateral basal ganglia. No evidence for acute or subacute ischemia. Gray-white matter differentiation maintained. No areas of chronic cortical infarction. No acute  or chronic intracranial blood products. No mass lesion, midline shift or mass effect. No hydrocephalus or extra-axial fluid collection. Pituitary gland suprasellar region within normal limits. Vascular: Major intracranial vascular flow voids are maintained. Skull and upper cervical spine: Craniocervical junction within normal limits. Bone marrow signal intensity normal. No scalp soft tissue abnormality. Sinuses/Orbits: Globes orbital soft tissues within normal limits. Paranasal sinuses are largely clear. No significant mastoid effusion. Other: None. IMPRESSION: 1. No acute intracranial abnormality. 2. Age-related cerebral atrophy with mild chronic small vessel ischemic disease, with a few small remote lacunar infarcts about the bilateral basal ganglia. Electronically Signed   By: Virgia Griffins M.D.   On: 05/25/2023 18:49   DG Chest 2 View Result Date: 05/25/2023 CLINICAL DATA:  Fever EXAM: CHEST - 2 VIEW COMPARISON:  05/04/2023 FINDINGS: Low lung volumes. No focal airspace disease or effusion. Stable cardiomediastinal silhouette with aortic atherosclerosis. No pneumothorax IMPRESSION: Low lung volumes. No active cardiopulmonary disease. Electronically Signed   By: Esmeralda Hedge M.D.   On: 05/25/2023 17:30   DG Chest Portable 1 View Result Date: 05/04/2023 CLINICAL DATA:  Possible pneumonia.  Confusion and lethargy. EXAM: PORTABLE CHEST 1 VIEW COMPARISON:  01/11/2023 FINDINGS: Shallow inspiration. Heart size and pulmonary vascularity are normal. Calcification of the aorta. Mediastinal contours appear intact. Lungs are clear. No pleural effusion or pneumothorax. IMPRESSION: No active disease. Electronically Signed   By: Boyce Byes M.D.   On: 05/04/2023 23:26   CT Head Wo Contrast Result Date: 05/04/2023 CLINICAL DATA:  Head trauma, minor (Age >= 65y) EXAM: CT HEAD WITHOUT CONTRAST TECHNIQUE: Contiguous axial images were obtained from the base of the skull through the vertex without intravenous  contrast. RADIATION DOSE REDUCTION: This exam was performed according to the departmental dose-optimization program which includes automated exposure control, adjustment of the mA and/or kV according to patient size and/or use of iterative reconstruction technique. COMPARISON:  None Available. FINDINGS: Brain: Cavum septum pellucidum. Parenchymal volume loss is commensurate with the patient's age. Mild periventricular white matter changes are present likely reflecting the sequela  of small vessel ischemia. Remote lacunar infarct noted within the right anterior capsule no abnormal intra or extra-axial mass lesion or fluid collection. No abnormal mass effect or midline shift. No evidence of acute intracranial hemorrhage or infarct. Ventricular size is normal. Cerebellum unremarkable. Vascular: No asymmetric hyperdense vasculature at the skull base. Skull: Intact Sinuses/Orbits: Paranasal sinuses are clear. Orbits are unremarkable. Other: Mastoid air cells and middle ear cavities are clear. IMPRESSION: 1. No acute intracranial abnormality. 2. Mild periventricular white matter changes likely reflecting the sequela of small vessel ischemia. 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 Electronically Signed   By: Worthy Heads M.D.   On: 05/04/2023 22:35      Subjective: Patient seen and examined at bedside. Complains of lower extremity tingling. No fever, vomiting, seizures reported.   Discharge Exam: Vitals:   06/03/23 0830 06/03/23 1328  BP: (!) 149/68 123/61  Pulse: 85 85  Resp: 17   Temp: 98 F (36.7 C)   SpO2: 91% 100%    General exam: Appears calm and comfortable.  Looks chronically ill and deconditioned.  On room air. Respiratory system: Bilateral decreased breath sounds at bases, no wheezing Cardiovascular system: S1 & S2 heard, Rate controlled Gastrointestinal system: Abdomen is nondistended, soft and nontender. Normal bowel sounds heard. Extremities: No cyanosis, clubbing,  edema  Central nervous system: Awake, slow to respond.  Poor historian. No focal neurological deficits. Skin: No rashes, lesions or ulcers Psychiatry: Flat affect.  Not agitated.    The results of significant diagnostics from this hospitalization (including imaging, microbiology, ancillary and laboratory) are listed below for reference.     Microbiology: Recent Results (from the past 240 hours)  Blood culture (routine x 2)     Status: None   Collection Time: 05/25/23 11:50 AM   Specimen: BLOOD  Result Value Ref Range Status   Specimen Description BLOOD SITE NOT SPECIFIED  Final   Special Requests   Final    BOTTLES DRAWN AEROBIC AND ANAEROBIC Blood Culture results may not be optimal due to an inadequate volume of blood received in culture bottles   Culture   Final    NO GROWTH 5 DAYS Performed at [facility], [address], [place], [place] [address]    Report Status 05/30/2023 FINAL  Final  Blood culture (routine x 2)     Status: Abnormal   Collection Time: 05/25/23 11:55 AM   Specimen: BLOOD  Result Value Ref Range Status   Specimen Description BLOOD SITE NOT SPECIFIED  Final   Special Requests   Final    BOTTLES DRAWN AEROBIC AND ANAEROBIC Blood Culture results may not be optimal due to an inadequate volume of blood received in culture bottles   Culture  Setup Time   Final    GRAM POSITIVE RODS ANAEROBIC BOTTLE ONLY CRITICAL RESULT CALLED TO, READ BACK BY AND VERIFIED WITH: V BRYK,PHARMD@[id number]  05/28/23 MK    Culture (A)  Final    CELLULOMONAS/MICROBACTERIUM SPECIES Standardized susceptibility testing for this organism is not available. Performed at [facility], [address], [place], [place] [address]    Report Status 06/02/2023 FINAL  Final  Resp panel by RT-PCR (RSV, Flu A&B, Covid) Anterior Nasal Swab     Status: None   Collection Time: 05/25/23  3:58 PM   Specimen: Anterior Nasal Swab  Result Value Ref Range Status   SARS Coronavirus 2 by RT PCR  NEGATIVE NEGATIVE Final   Influenza A by PCR NEGATIVE NEGATIVE  Final   Influenza B by PCR NEGATIVE NEGATIVE Final    Comment: (NOTE) The Xpert Xpress SARS-CoV-2/FLU/RSV plus assay is intended as an aid in the diagnosis of influenza from Nasopharyngeal swab specimens and should not be used as a sole basis for treatment. Nasal washings and aspirates are unacceptable for Xpert Xpress SARS-CoV-2/FLU/RSV testing.  Fact Sheet for Patients: BloggerCourse.com  Fact Sheet for Healthcare Providers: SeriousBroker.it  This test is not yet approved or cleared by the [place]  FDA and has been authorized for detection and/or diagnosis of SARS-CoV-2 by FDA under an Emergency Use Authorization (EUA). This EUA will remain in effect (meaning this test can be used) for the duration of the COVID-19 declaration under Section 564(b)(1) of the Act, 21 U.S.C. section 360bbb-3(b)(1), unless the authorization is terminated or revoked.     Resp Syncytial Virus by PCR NEGATIVE NEGATIVE Final    Comment: (NOTE) Fact Sheet for Patients: BloggerCourse.com  Fact Sheet for Healthcare Providers: SeriousBroker.it  This test is not yet approved or cleared by the [place]  FDA and has been authorized for detection and/or diagnosis of SARS-CoV-2 by FDA under an Emergency Use Authorization (EUA). This EUA will remain in effect (meaning this test can be used) for the duration of the COVID-19 declaration under Section 564(b)(1) of the Act, 21 U.S.C. section 360bbb-3(b)(1), unless the authorization is terminated or revoked.  Performed at [facility], [address], [place], [place] [address]   Culture, blood (Routine X 2) w Reflex to ID Panel     Status: None   Collection Time: 05/28/23  9:19 PM   Specimen: BLOOD RIGHT ARM  Result Value Ref Range Status   Specimen Description BLOOD RIGHT ARM  Final    Special Requests   Final    BOTTLES DRAWN AEROBIC AND ANAEROBIC Blood Culture results may not be optimal due to an inadequate volume of blood received in culture bottles   Culture   Final    NO GROWTH 5 DAYS Performed at [facility], [address], [place], [place] [address]    Report Status 06/02/2023 FINAL  Final  Culture, blood (Routine X 2) w Reflex to ID Panel     Status: None   Collection Time: 05/28/23  9:21 PM   Specimen: BLOOD RIGHT ARM  Result Value Ref Range Status   Specimen Description BLOOD RIGHT ARM  Final   Special Requests   Final    BOTTLES DRAWN AEROBIC AND ANAEROBIC Blood Culture results may not be optimal due to an inadequate volume of blood received in culture bottles   Culture   Final    NO GROWTH 5 DAYS Performed at [facility], [address], [place], [place] [address]    Report Status 06/02/2023 FINAL  Final  Respiratory (~20 pathogens) panel by PCR     Status: None   Collection Time: 05/29/23  1:21 AM   Specimen: Nasopharyngeal Swab; Respiratory  Result Value Ref Range Status   Adenovirus NOT DETECTED NOT DETECTED Final   Coronavirus 229E NOT DETECTED NOT DETECTED Final    Comment: (NOTE) The Coronavirus on the Respiratory Panel, DOES NOT test for the novel  Coronavirus (2019 nCoV)    Coronavirus HKU1 NOT DETECTED NOT DETECTED Final   Coronavirus NL63 NOT DETECTED NOT DETECTED Final   Coronavirus OC43 NOT DETECTED NOT DETECTED Final   Metapneumovirus NOT DETECTED NOT DETECTED Final   Rhinovirus / Enterovirus NOT DETECTED NOT DETECTED Final   Influenza A  NOT DETECTED NOT DETECTED Final   Influenza B NOT DETECTED NOT DETECTED Final   Parainfluenza Virus 1 NOT DETECTED NOT DETECTED Final   Parainfluenza Virus 2 NOT DETECTED NOT DETECTED Final   Parainfluenza Virus 3 NOT DETECTED NOT DETECTED Final   Parainfluenza Virus 4 NOT DETECTED NOT DETECTED Final   Respiratory Syncytial Virus NOT DETECTED NOT DETECTED Final   Bordetella  pertussis NOT DETECTED NOT DETECTED Final   Bordetella Parapertussis NOT DETECTED NOT DETECTED Final   Chlamydophila pneumoniae NOT DETECTED NOT DETECTED Final   Mycoplasma pneumoniae NOT DETECTED NOT DETECTED Final    Comment: Performed at [facility], [address] [place], [place] [address]     Labs: BNP (last 3 results) No results for input(s): "BNP" in the last 8760 hours. Basic Metabolic Panel: Recent Labs  Lab 05/30/23 0409 05/31/23 0627 06/01/23 0847 06/02/23 0547 06/03/23 0513  NA 134* 138 135 137 137  K 3.8 4.3 4.5 4.2 4.7  CL 105 109 107 106 110  CO2 20* 20* 16* 19* 22  GLUCOSE 474* 308* 372* 363* 117*  BUN 50* 54* 69* 72* 79*  CREATININE 2.81* 2.39* 2.74* 2.33* 2.30*  CALCIUM  8.7* 9.2 8.6* 9.0 8.8*   Liver Function Tests: Recent Labs  Lab 05/28/23 0522 05/29/23 0823 05/30/23 0409  AST 22 53* 35  ALT 15 30 30   ALKPHOS 99 170* 164*  BILITOT 0.5 0.2 0.4  PROT 7.3 7.1 7.0  ALBUMIN 2.7* 2.4* 2.3*   No results for input(s): "LIPASE", "AMYLASE" in the last 168 hours. No results for input(s): "AMMONIA" in the last 168 hours. CBC: Recent Labs  Lab 05/28/23 0522 05/28/23 2119 05/29/23 0823 05/30/23 0409 06/03/23 0513  WBC 11.0* 10.6* 9.5 7.0 17.4*  HGB 10.2* 10.2* 9.7* 9.3* 9.2*  HCT 30.7* 29.5* 28.7* 27.3* 26.6*  MCV 87.5 86.0 86.2 86.7 85.5  PLT 175 193 167 188 290   Cardiac Enzymes: No results for input(s): "CKTOTAL", "CKMB", "CKMBINDEX", "TROPONINI" in the last 168 hours. BNP: Invalid input(s): "POCBNP" CBG: Recent Labs  Lab 06/02/23 1129 06/02/23 1610 06/02/23 2144 06/03/23 0537 06/03/23 1124  GLUCAP 93 279* 193* 108* 178*   D-Dimer No results for input(s): "DDIMER" in the last 72 hours. Hgb A1c No results for input(s): "HGBA1C" in the last 72 hours. Lipid Profile No results for input(s): "CHOL", "HDL", "LDLCALC", "TRIG", "CHOLHDL", "LDLDIRECT" in the last 72 hours. Thyroid  function studies No results for input(s): "TSH",  "T4TOTAL", "T3FREE", "THYROIDAB" in the last 72 hours.  Invalid input(s): "FREET3" Anemia work up No results for input(s): "VITAMINB12", "FOLATE", "FERRITIN", "TIBC", "IRON", "RETICCTPCT" in the last 72 hours. Urinalysis    Component Value Date/Time   COLORURINE YELLOW 05/28/2023 2300   APPEARANCEUR CLEAR 05/28/2023 2300   LABSPEC 1.012 05/28/2023 2300   PHURINE 7.0 05/28/2023 2300   GLUCOSEU 150 (A) 05/28/2023 2300   HGBUR MODERATE (A) 05/28/2023 2300   BILIRUBINUR NEGATIVE 05/28/2023 2300   KETONESUR NEGATIVE 05/28/2023 2300   PROTEINUR 100 (A) 05/28/2023 2300   UROBILINOGEN 1.0 09/23/2014 1458   NITRITE NEGATIVE 05/28/2023 2300   LEUKOCYTESUR NEGATIVE 05/28/2023 2300   Sepsis Labs Recent Labs  Lab 05/28/23 2119 05/29/23 0823 05/30/23 0409 06/03/23 0513  WBC 10.6* 9.5 7.0 17.4*   Microbiology Recent Results (from the past 240 hours)  Blood culture (routine x 2)     Status: None   Collection Time: 05/25/23 11:50 AM   Specimen: BLOOD  Result Value Ref Range Status   Specimen Description BLOOD  SITE NOT SPECIFIED  Final   Special Requests   Final    BOTTLES DRAWN AEROBIC AND ANAEROBIC Blood Culture results may not be optimal due to an inadequate volume of blood received in culture bottles   Culture   Final    NO GROWTH 5 DAYS Performed at [facility], [address], [place], [place] [address]    Report Status 05/30/2023 FINAL  Final  Blood culture (routine x 2)     Status: Abnormal   Collection Time: 05/25/23 11:55 AM   Specimen: BLOOD  Result Value Ref Range Status   Specimen Description BLOOD SITE NOT SPECIFIED  Final   Special Requests   Final    BOTTLES DRAWN AEROBIC AND ANAEROBIC Blood Culture results may not be optimal due to an inadequate volume of blood received in culture bottles   Culture  Setup Time   Final    GRAM POSITIVE RODS ANAEROBIC BOTTLE ONLY CRITICAL RESULT CALLED TO, READ BACK BY AND VERIFIED WITH: V BRYK,PHARMD@[id number]  05/28/23 MK     Culture (A)  Final    CELLULOMONAS/MICROBACTERIUM SPECIES Standardized susceptibility testing for this organism is not available. Performed at [facility], [address], [place], [place] [address]    Report Status 06/02/2023 FINAL  Final  Resp panel by RT-PCR (RSV, Flu A&B, Covid) Anterior Nasal Swab     Status: None   Collection Time: 05/25/23  3:58 PM   Specimen: Anterior Nasal Swab  Result Value Ref Range Status   SARS Coronavirus 2 by RT PCR NEGATIVE NEGATIVE Final   Influenza A by PCR NEGATIVE NEGATIVE Final   Influenza B by PCR NEGATIVE NEGATIVE Final    Comment: (NOTE) The Xpert Xpress SARS-CoV-2/FLU/RSV plus assay is intended as an aid in the diagnosis of influenza from Nasopharyngeal swab specimens and should not be used as a sole basis for treatment. Nasal washings and aspirates are unacceptable for Xpert Xpress SARS-CoV-2/FLU/RSV testing.  Fact Sheet for Patients: BloggerCourse.com  Fact Sheet for Healthcare Providers: SeriousBroker.it  This test is not yet approved or cleared by the [place]  FDA and has been authorized for detection and/or diagnosis of SARS-CoV-2 by FDA under an Emergency Use Authorization (EUA). This EUA will remain in effect (meaning this test can be used) for the duration of the COVID-19 declaration under Section 564(b)(1) of the Act, 21 U.S.C. section 360bbb-3(b)(1), unless the authorization is terminated or revoked.     Resp Syncytial Virus by PCR NEGATIVE NEGATIVE Final    Comment: (NOTE) Fact Sheet for Patients: BloggerCourse.com  Fact Sheet for Healthcare Providers: SeriousBroker.it  This test is not yet approved or cleared by the [place]  FDA and has been authorized for detection and/or diagnosis of SARS-CoV-2 by FDA under an Emergency Use Authorization (EUA). This EUA will remain in effect (meaning this test can be  used) for the duration of the COVID-19 declaration under Section 564(b)(1) of the Act, 21 U.S.C. section 360bbb-3(b)(1), unless the authorization is terminated or revoked.  Performed at [facility], [address], [place], [place] [address]   Culture, blood (Routine X 2) w Reflex to ID Panel     Status: None   Collection Time: 05/28/23  9:19 PM   Specimen: BLOOD RIGHT ARM  Result Value Ref Range Status   Specimen Description BLOOD RIGHT ARM  Final   Special Requests   Final    BOTTLES DRAWN AEROBIC AND ANAEROBIC Blood Culture results may not be optimal due to an  inadequate volume of blood received in culture bottles   Culture   Final    NO GROWTH 5 DAYS Performed at [facility], [address] [place], [place] [address]    Report Status 06/02/2023 FINAL  Final  Culture, blood (Routine X 2) w Reflex to ID Panel     Status: None   Collection Time: 05/28/23  9:21 PM   Specimen: BLOOD RIGHT ARM  Result Value Ref Range Status   Specimen Description BLOOD RIGHT ARM  Final   Special Requests   Final    BOTTLES DRAWN AEROBIC AND ANAEROBIC Blood Culture results may not be optimal due to an inadequate volume of blood received in culture bottles   Culture   Final    NO GROWTH 5 DAYS Performed at [facility], [address] [place], [place] [address]    Report Status 06/02/2023 FINAL  Final  Respiratory (~20 pathogens) panel by PCR     Status: None   Collection Time: 05/29/23  1:21 AM   Specimen: Nasopharyngeal Swab; Respiratory  Result Value Ref Range Status   Adenovirus NOT DETECTED NOT DETECTED Final   Coronavirus 229E NOT DETECTED NOT DETECTED Final    Comment: (NOTE) The Coronavirus on the Respiratory Panel, DOES NOT test for the novel  Coronavirus (2019 nCoV)    Coronavirus HKU1 NOT DETECTED NOT DETECTED Final   Coronavirus NL63 NOT DETECTED NOT DETECTED Final   Coronavirus OC43 NOT DETECTED NOT DETECTED Final   Metapneumovirus NOT DETECTED NOT DETECTED  Final   Rhinovirus / Enterovirus NOT DETECTED NOT DETECTED Final   Influenza A NOT DETECTED NOT DETECTED Final   Influenza B NOT DETECTED NOT DETECTED Final   Parainfluenza Virus 1 NOT DETECTED NOT DETECTED Final   Parainfluenza Virus 2 NOT DETECTED NOT DETECTED Final   Parainfluenza Virus 3 NOT DETECTED NOT DETECTED Final   Parainfluenza Virus 4 NOT DETECTED NOT DETECTED Final   Respiratory Syncytial Virus NOT DETECTED NOT DETECTED Final   Bordetella pertussis NOT DETECTED NOT DETECTED Final   Bordetella Parapertussis NOT DETECTED NOT DETECTED Final   Chlamydophila pneumoniae NOT DETECTED NOT DETECTED Final   Mycoplasma pneumoniae NOT DETECTED NOT DETECTED Final    Comment: Performed at [facility], [address] [place], [place] [address]     Time coordinating discharge: 35 minutes  SIGNED:   Audria Leather, MD  [facility] 06/03/2023, 4:21 PM

## 2023-06-03 NOTE — Progress Notes (Signed)
 PROGRESS NOTE    Nichole Munoz  ZOX:[id number] DOB: 25-Apr-1945 DOA: 05/25/2023 PCP: [facility]   Brief Narrative:  78 y.o. 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  On presentation, temperature was 100.1 with creatinine of 2.55.  COVID/influenza/RSV PCR negative.  MRI of brain without contrast did not show any acute intracranial abnormity MRI C-spine showed no evidence for acute traumatic injury, moderate right paracentral disc extrusion with superior migration C7-T1 with mild to moderate spinal stenosis with multilevel cervical spine spondylosis with mild to moderate diffuse spinal stenosis C3-4 through C6-7 MRI T-spine showed grossly normal appearance of the thoracic spinal cord.  MRI lumbar spine showed moderate spinal stenosis L2-L3, L3-L4, moderate to severe foraminal narrowing L2-3, L3-4.  Patient was initially started on broad-spectrum antibiotics.  Neurosurgery was consulted.  Subsequently, patient has remained on afebrile and has completed antibiotic course.  PT recommending SNF placement.  TOC following.  Assessment & Plan:   Bilateral lower extremity pain with radiculopathy Ambulatory dysfunction History of CVA with residual left-sided weakness Multilevel spinal stenosis in cervical, thoracic and lumbar spine -Orthopedics recommended no need for any orthopedic intervention and thought that her symptoms were radicular in nature - Neurology/Dr. Michale Age tried to evaluate the patient on 05/28/2023 but he had trouble obtaining history due to language barrier.  Patient will possibly need outpatient neurosurgical evaluation. -Currently on tapering prednisone .  Continue gabapentin  along with Voltaren  gel and as needed oxycodone  and  Tylenol . -PT OT recommending SNF placement.  TOC following.  Fever, unclear etiology Gram-positive rods bacteremia - 1 set of blood culture on admission grew cell cellulomonas/Microbacterium species: Possibly contaminant.  Repeat blood cultures have been negative so far - Patient has completed a course of antibiotic therapy and antibiotics discontinued on 06/01/2023.  Currently afebrile.  Monitor off of antibiotics.  Acute renal failure on CKD stage IIIb Acute metabolic acidosis: Resolved - Creatinine peaked to 2.81.  Improving to 2.30 today.  Possibly close to baseline.  Monitor intermittently  Leukocytosis - Possibly steroid mediated.  Monitor intermittently  Hyponatremia Resolved  Anemia of chronic disease - From chronic illnesses and renal failure.  Hemoglobin currently stable.  Monitor intermittently.  No signs of bleeding  Diabetes mellitus type 2 with hyperglycemia From carb modified diet.  Continue long-acting insulin  along with short acting insulin  with meals and CBGs with SSI  Hypertension Hyperlipidemia -Continue amlodipine  and statin.  Monitor blood pressure  Abnormal MRI: Incidental finding -MRI showed Loss of normal flow void within the right vertebral artery, which could reflect slow flow and/or occlusion. Discussed with Neurology, Dr. Cleone Dad by Dr. Bobbetta Burnet; patient leg symptoms are not explained by MRI finding and felt to be incidental   Asthma - Stable.  Continue current inhaled regimen   DVT prophylaxis: Heparin  subcutaneous Code Status: Full Family Communication: None at bedside Disposition Plan: Status is: Inpatient Remains inpatient appropriate because: Of severity of illness.  Need for SNF placement.  Currently medically stable for discharge to SNF  Consultants: Orthopedics. Dr. Bobbetta Burnet discussed with neurology, Dr. Cleone Dad regarding abnormal MRI findings.  Neurosurgery/Dr. Michale Age: Evaluation still pending  Procedures: None  Antimicrobials:  Anti-infectives  (From admission, onward)    Start     Dose/Rate Route Frequency Ordered Stop   05/28/23 1000  vancomycin  (  VANCOREADY) IVPB 500 mg/100 mL  Status:  Discontinued        500 mg 100 mL/hr over 60 Minutes Intravenous Every 48 hours 05/26/23 0334 06/01/23 1215   05/26/23 2200  ceFEPIme  (MAXIPIME ) 1 g in sodium chloride  0.9 % 100 mL IVPB  Status:  Discontinued        1 g 200 mL/hr over 30 Minutes Intravenous Every 24 hours 05/26/23 0308 06/01/23 1215   05/25/23 1615  vancomycin  (VANCOCIN ) IVPB 1000 mg/200 mL premix        1,000 mg 200 mL/hr over 60 Minutes Intravenous  Once 05/25/23 1601 05/26/23 0021   05/25/23 1615  ceFEPIme  (MAXIPIME ) 2 g in sodium chloride  0.9 % 100 mL IVPB        2 g 200 mL/hr over 30 Minutes Intravenous  Once 05/25/23 1601 05/25/23 2330   05/25/23 1600  metroNIDAZOLE  (FLAGYL ) IVPB 500 mg        500 mg 100 mL/hr over 60 Minutes Intravenous  Once 05/25/23 1552 05/25/23 2330       Subjective: Patient seen and examined at bedside.  Complains of lower extremity tingling.  No fever, vomiting, seizures reported.  Objective: Vitals:   06/02/23 1936 06/03/23 0322 06/03/23 0829 06/03/23 0830  BP: (!) 81/61 (!) 110/97  (!) 149/68  Pulse: 82 77  85  Resp: 15 18  17   Temp: 98.4 F (36.9 C) 98 F (36.7 C)  98 F (36.7 C)  TempSrc:      SpO2: 93% 100% 91% 91%  Weight:      Height:       No intake or output data in the 24 hours ending 06/03/23 1022 Filed Weights   05/25/23 0936  Weight: 45.4 kg    Examination:  General exam: Appears calm and comfortable.  Looks chronically ill and deconditioned.  On room air. Respiratory system: Bilateral decreased breath sounds at bases, no wheezing Cardiovascular system: S1 & S2 heard, Rate controlled Gastrointestinal system: Abdomen is nondistended, soft and nontender. Normal bowel sounds heard. Extremities: No cyanosis, clubbing, edema  Central nervous system: Awake, slow to respond.  Poor historian. No focal neurological  deficits. Skin: No rashes, lesions or ulcers Psychiatry: Flat affect.  Not agitated.    Data Reviewed: I have personally reviewed following labs and imaging studies  CBC: Recent Labs  Lab 05/28/23 0522 05/28/23 2119 05/29/23 0823 05/30/23 0409 06/03/23 0513  WBC 11.0* 10.6* 9.5 7.0 17.4*  HGB 10.2* 10.2* 9.7* 9.3* 9.2*  HCT 30.7* 29.5* 28.7* 27.3* 26.6*  MCV 87.5 86.0 86.2 86.7 85.5  PLT 175 193 167 188 290   Basic Metabolic Panel: Recent Labs  Lab 05/30/23 0409 05/31/23 0627 06/01/23 0847 06/02/23 0547 06/03/23 0513  NA 134* 138 135 137 137  K 3.8 4.3 4.5 4.2 4.7  CL 105 109 107 106 110  CO2 20* 20* 16* 19* 22  GLUCOSE 474* 308* 372* 363* 117*  BUN 50* 54* 69* 72* 79*  CREATININE 2.81* 2.39* 2.74* 2.33* 2.30*  CALCIUM  8.7* 9.2 8.6* 9.0 8.8*   GFR: Estimated Creatinine Clearance: 14.2 mL/min (A) (by C-G formula based on SCr of 2.3 mg/dL (H)). Liver Function Tests: Recent Labs  Lab 05/28/23 0522 05/29/23 0823 05/30/23 0409  AST 22 53* 35  ALT 15 30 30   ALKPHOS 99 170* 164*  BILITOT 0.5 0.2 0.4  PROT 7.3 7.1 7.0  ALBUMIN 2.7* 2.4* 2.3*   No results for input(s): "LIPASE", "AMYLASE" in the last 168 hours. No results for  input(s): "AMMONIA" in the last 168 hours. Coagulation Profile: No results for input(s): "INR", "PROTIME" in the last 168 hours. Cardiac Enzymes: No results for input(s): "CKTOTAL", "CKMB", "CKMBINDEX", "TROPONINI" in the last 168 hours. BNP (last 3 results) No results for input(s): "PROBNP" in the last 8760 hours. HbA1C: No results for input(s): "HGBA1C" in the last 72 hours. CBG: Recent Labs  Lab 06/02/23 0622 06/02/23 1129 06/02/23 1610 06/02/23 2144 06/03/23 0537  GLUCAP 289* 93 279* 193* 108*   Lipid Profile: No results for input(s): "CHOL", "HDL", "LDLCALC", "TRIG", "CHOLHDL", "LDLDIRECT" in the last 72 hours. Thyroid  Function Tests: No results for input(s): "TSH", "T4TOTAL", "FREET4", "T3FREE", "THYROIDAB" in the last 72  hours. Anemia Panel: No results for input(s): "VITAMINB12", "FOLATE", "FERRITIN", "TIBC", "IRON", "RETICCTPCT" in the last 72 hours. Sepsis Labs: Recent Labs  Lab 05/28/23 2119  LATICACIDVEN 1.1    Recent Results (from the past 240 hours)  Blood culture (routine x 2)     Status: None   Collection Time: 05/25/23 11:50 AM   Specimen: BLOOD  Result Value Ref Range Status   Specimen Description BLOOD SITE NOT SPECIFIED  Final   Special Requests   Final    BOTTLES DRAWN AEROBIC AND ANAEROBIC Blood Culture results may not be optimal due to an inadequate volume of blood received in culture bottles   Culture   Final    NO GROWTH 5 DAYS Performed at [facility], [address], [place], [place] [address]    Report Status 05/30/2023 FINAL  Final  Blood culture (routine x 2)     Status: Abnormal   Collection Time: 05/25/23 11:55 AM   Specimen: BLOOD  Result Value Ref Range Status   Specimen Description BLOOD SITE NOT SPECIFIED  Final   Special Requests   Final    BOTTLES DRAWN AEROBIC AND ANAEROBIC Blood Culture results may not be optimal due to an inadequate volume of blood received in culture bottles   Culture  Setup Time   Final    GRAM POSITIVE RODS ANAEROBIC BOTTLE ONLY CRITICAL RESULT CALLED TO, READ BACK BY AND VERIFIED WITH: V BRYK,PHARMD@[email or web address]  05/28/23 MK    Culture (A)  Final    CELLULOMONAS/MICROBACTERIUM SPECIES Standardized susceptibility testing for this organism is not available. Performed at [facility], [address] [place], [place] [address]    Report Status 06/02/2023 FINAL  Final  Resp panel by RT-PCR (RSV, Flu A&B, Covid) Anterior Nasal Swab     Status: None   Collection Time: 05/25/23  3:58 PM   Specimen: Anterior Nasal Swab  Result Value Ref Range Status   SARS Coronavirus 2 by RT PCR NEGATIVE NEGATIVE Final   Influenza A by PCR NEGATIVE NEGATIVE Final   Influenza B by PCR NEGATIVE NEGATIVE Final    Comment: (NOTE) The Xpert Xpress  SARS-CoV-2/FLU/RSV plus assay is intended as an aid in the diagnosis of influenza from Nasopharyngeal swab specimens and should not be used as a sole basis for treatment. Nasal washings and aspirates are unacceptable for Xpert Xpress SARS-CoV-2/FLU/RSV testing.  Fact Sheet for Patients: BloggerCourse.com  Fact Sheet for Healthcare Providers: SeriousBroker.it  This test is not yet approved or cleared by the [place]  FDA and has been authorized for detection and/or diagnosis of SARS-CoV-2 by FDA under an Emergency Use Authorization (EUA). This EUA will remain in effect (meaning this test can be used) for the duration of the COVID-19 declaration under Section 564(b)(1) of the Act, 21 U.S.C. section 360bbb-3(b)(1),  unless the authorization is terminated or revoked.     Resp Syncytial Virus by PCR NEGATIVE NEGATIVE Final    Comment: (NOTE) Fact Sheet for Patients: BloggerCourse.com  Fact Sheet for Healthcare Providers: SeriousBroker.it  This test is not yet approved or cleared by the [place]  FDA and has been authorized for detection and/or diagnosis of SARS-CoV-2 by FDA under an Emergency Use Authorization (EUA). This EUA will remain in effect (meaning this test can be used) for the duration of the COVID-19 declaration under Section 564(b)(1) of the Act, 21 U.S.C. section 360bbb-3(b)(1), unless the authorization is terminated or revoked.  Performed at [facility], [address] [place], [place] [address]   Culture, blood (Routine X 2) w Reflex to ID Panel     Status: None   Collection Time: 05/28/23  9:19 PM   Specimen: BLOOD RIGHT ARM  Result Value Ref Range Status   Specimen Description BLOOD RIGHT ARM  Final   Special Requests   Final    BOTTLES DRAWN AEROBIC AND ANAEROBIC Blood Culture results may not be optimal due to an inadequate volume of blood  received in culture bottles   Culture   Final    NO GROWTH 5 DAYS Performed at [facility], [address], [place], [place] [address]    Report Status 06/02/2023 FINAL  Final  Culture, blood (Routine X 2) w Reflex to ID Panel     Status: None   Collection Time: 05/28/23  9:21 PM   Specimen: BLOOD RIGHT ARM  Result Value Ref Range Status   Specimen Description BLOOD RIGHT ARM  Final   Special Requests   Final    BOTTLES DRAWN AEROBIC AND ANAEROBIC Blood Culture results may not be optimal due to an inadequate volume of blood received in culture bottles   Culture   Final    NO GROWTH 5 DAYS Performed at [facility], [address], [place], [place] [address]    Report Status 06/02/2023 FINAL  Final  Respiratory (~20 pathogens) panel by PCR     Status: None   Collection Time: 05/29/23  1:21 AM   Specimen: Nasopharyngeal Swab; Respiratory  Result Value Ref Range Status   Adenovirus NOT DETECTED NOT DETECTED Final   Coronavirus 229E NOT DETECTED NOT DETECTED Final    Comment: (NOTE) The Coronavirus on the Respiratory Panel, DOES NOT test for the novel  Coronavirus (2019 nCoV)    Coronavirus HKU1 NOT DETECTED NOT DETECTED Final   Coronavirus NL63 NOT DETECTED NOT DETECTED Final   Coronavirus OC43 NOT DETECTED NOT DETECTED Final   Metapneumovirus NOT DETECTED NOT DETECTED Final   Rhinovirus / Enterovirus NOT DETECTED NOT DETECTED Final   Influenza A NOT DETECTED NOT DETECTED Final   Influenza B NOT DETECTED NOT DETECTED Final   Parainfluenza Virus 1 NOT DETECTED NOT DETECTED Final   Parainfluenza Virus 2 NOT DETECTED NOT DETECTED Final   Parainfluenza Virus 3 NOT DETECTED NOT DETECTED Final   Parainfluenza Virus 4 NOT DETECTED NOT DETECTED Final   Respiratory Syncytial Virus NOT DETECTED NOT DETECTED Final   Bordetella pertussis NOT DETECTED NOT DETECTED Final   Bordetella Parapertussis NOT DETECTED NOT DETECTED Final   Chlamydophila pneumoniae NOT DETECTED NOT  DETECTED Final   Mycoplasma pneumoniae NOT DETECTED NOT DETECTED Final    Comment: Performed at [facility], [address] [place], [place] [address]         Radiology Studies: No results found.  Scheduled Meds:  amLODipine   5 mg Oral Daily   aspirin   81 mg Oral Daily   diclofenac  Sodium  4 g Topical QID   fluticasone  furoate-vilanterol  1 puff Inhalation Daily   gabapentin   200 mg Oral TID   heparin   5,000 Units Subcutaneous Q8H   insulin  aspart  0-20 Units Subcutaneous TID WC   insulin  aspart  0-5 Units Subcutaneous QHS   insulin  aspart  14 Units Subcutaneous TID WC   insulin  glargine-yfgn  45 Units Subcutaneous Daily   mirtazapine   30 mg Oral QHS   polyethylene glycol  17 g Oral Daily   predniSONE   40 mg Oral Q breakfast   Followed by   Cecily Cohen ON 06/08/2023] predniSONE   20 mg Oral Q breakfast   rosuvastatin   40 mg Oral Daily   senna-docusate  1 tablet Oral BID   Continuous Infusions:        Audria Leather, MD [facility] 06/03/2023, 10:22 AM

## 2023-06-03 NOTE — Progress Notes (Signed)
 Occupational Therapy Treatment Patient Details Name: Nichole Munoz MRN: [id number] DOB: 03-23-1945 Today's Date: 06/03/2023   History of present illness Pt is a 78 y.o female admitted from home 5/19 for bilateral leg pain. Negative for DVT, UTI. MRI brain and spine showed no acute trauma, mild to moderate spinal stenosis. Recent admission 01/2023 for syncope, 4/28-5/2 encephalopathy  PMH: CVA with residual L sided weakness, CKD, HTN, asthma, neuropathy, DM, diverticulitiss, C7 fx 2018   OT comments  Pt progressing well towards goals. Progressed today to complete functional transfers at [facility] level with heavy cues for safety with RW. Pt family reporting a preference for d/c home. Educated family on importance of 24/7 supervision and need for w/c in home. Educated family on safe functional transfers with use of w/c and positioning. Recommend HHOT, if pt's family can provided 24/7 supervision and support in home. Will continue to follow acutely.       If plan is discharge home, recommend the following:  A lot of help with walking and/or transfers;A lot of help with bathing/dressing/bathroom;Supervision due to cognitive status   Equipment Recommendations  BSC/3in1;Wheelchair (measurements OT);Wheelchair cushion (measurements OT)    Recommendations for Other Services      Precautions / Restrictions Precautions Precautions: Fall Recall of Precautions/Restrictions: Impaired Precaution/Restrictions Comments: HoH Restrictions Weight Bearing Restrictions Per Provider Order: No       Mobility Bed Mobility Overal bed mobility: Needs Assistance Bed Mobility: Supine to Sit     Supine to sit: Contact guard, HOB elevated     General bed mobility comments: cues for safety    Transfers Overall transfer level: Needs assistance Equipment used: Rolling walker (2 wheels) Transfers: Sit to/from Stand, Bed to chair/wheelchair/BSC Sit to Stand: Contact guard assist     Step pivot transfers: Contact  guard assist     General transfer comment: Cues for safety, and managing use of RW     Balance Overall balance assessment: Needs assistance Sitting-balance support: Feet supported Sitting balance-Leahy Scale: Fair Sitting balance - Comments: supervision at EOB   Standing balance support: Bilateral upper extremity supported, Reliant on assistive device for balance, During functional activity Standing balance-Leahy Scale: Poor Standing balance comment: Reliant on external support                           ADL either performed or assessed with clinical judgement   ADL Overall ADL's : Needs assistance/impaired Eating/Feeding: Sitting;Set up   Toilet Transfer: Contact guard assist;Cueing for safety;Ambulation;Rolling walker (2 wheels) Toilet Transfer Details (indicate cue type and reason): Simulated in hall, pt requiring increased cues and assistance with RW Toileting- Clothing Manipulation and Hygiene: Contact guard assist;Sit to/from stand       Functional mobility during ADLs: Contact guard assist;Rolling walker (2 wheels) General ADL Comments: Pt with poor safety awareness and judgement with mobility    Extremity/Trunk Assessment Upper Extremity Assessment Upper Extremity Assessment: Generalized weakness;Right hand dominant   Lower Extremity Assessment Lower Extremity Assessment: Defer to PT evaluation        Vision   Additional Comments: Pt showing signs of L sided inattention with mobility, but not formally tested   Perception Perception Perception: Impaired Preception Impairment Details: Spatial orientation   Praxis Praxis Praxis: Impaired Praxis Impairment Details: Motor planning;Organization Praxis-Other Comments: During mobility pt showing signs of ataxic movements with use of RW, family reporting potential baseline   Communication Communication Communication: Impaired Factors Affecting Communication: Non - English speaking, interpreter not  available   Cognition Arousal: Alert Behavior During Therapy: Anxious, Impulsive Cognition: Cognition impaired     Awareness: Intellectual awareness intact, Online awareness impaired     Executive functioning impairment (select all impairments): Sequencing, Problem solving OT - Cognition Comments: Pt impulsive, easily frustrated with mobility   Following commands: Impaired Following commands impaired: Follows one step commands inconsistently, Follows one step commands with increased time      Cueing   Cueing Techniques: Verbal cues, Gestural cues, Tactile cues, Visual cues        General Comments Family present and interpreting during session, reporting preference for d/c home over snf, educated on safety with functional w/c transfers    Pertinent Vitals/ Pain       Pain Assessment Pain Assessment: No/denies pain Faces Pain Scale: Hurts a little bit Pain Location: BLE (knees/legs) with standing Pain Descriptors / Indicators: Discomfort, Grimacing, Guarding Pain Intervention(s): Monitored during session   Frequency  Min 2X/week        Progress Toward Goals  OT Goals(current goals can now be found in the care plan section)  Progress towards OT goals: Progressing toward goals  Acute Rehab OT Goals Patient Stated Goal: To go home OT Goal Formulation: With patient Time For Goal Achievement: 06/11/23 Potential to Achieve Goals: Good ADL Goals Pt Will Perform Lower Body Dressing: with mod assist;sit to/from stand Pt Will Transfer to Toilet: with mod assist;ambulating Pt Will Perform Toileting - Clothing Manipulation and hygiene: with mod assist;sit to/from stand  Plan      Co-evaluation    PT/OT/SLP Co-Evaluation/Treatment: Yes Reason for Co-Treatment: Complexity of the patient's impairments (multi-system involvement);To address functional/ADL transfers;For patient/therapist safety;Necessary to address cognition/behavior during functional activity PT goals  addressed during session: Mobility/safety with mobility;Proper use of DME;Balance OT goals addressed during session: ADL's and self-care      AM-PAC OT "6 Clicks" Daily Activity     Outcome Measure   Help from another person eating meals?: None Help from another person taking care of personal grooming?: A Little Help from another person toileting, which includes using toliet, bedpan, or urinal?: A Lot Help from another person bathing (including washing, rinsing, drying)?: A Lot Help from another person to put on and taking off regular upper body clothing?: A Little Help from another person to put on and taking off regular lower body clothing?: A Lot 6 Click Score: 16    End of Session Equipment Utilized During Treatment: Gait belt;Rolling walker (2 wheels)  OT Visit Diagnosis: Unsteadiness on feet (R26.81);History of falling (Z91.81);Muscle weakness (generalized) (M62.81);Pain   Activity Tolerance Patient tolerated treatment well   Patient Left in chair;with call bell/phone within reach;with chair alarm set;with family/visitor present   Nurse Communication Mobility status        Time: 1610-9604 OT Time Calculation (min): 18 min  Charges: OT General Charges $OT Visit: 1 Visit OT Treatments $Self Care/Home Management : 8-22 mins  Delmer Ferraris, OT  Acute Rehabilitation Services Office [phone] Secure chat preferred   Mickael Alamo 06/03/2023, 4:37 PM

## 2023-06-04 ENCOUNTER — Telehealth: Payer: Self-pay

## 2023-06-04 NOTE — Transitions of Care (Post Inpatient/ED Visit) (Signed)
   06/04/2023  Name: Charne Mcbrien MRN: [id number] DOB: 1945/03/12  Today's TOC FU Call Status: Today's TOC FU Call Status:: Unsuccessful Call (1st Attempt) Unsuccessful Call (1st Attempt) Date: 06/04/23  Attempted to reach the patient regarding the most recent Inpatient/ED visit.  Follow Up Plan: Additional outreach attempts will be made to reach the patient to complete the Transitions of Care (Post Inpatient/ED visit) call.   Jaziel Bennett J. Allesha Aronoff RN, MSN [facility], [facility] RN Care Manager Direct Dial: [phone]  Fax: [phone] Website: [email or web address]

## 2023-06-04 NOTE — Transitions of Care (Post Inpatient/ED Visit) (Signed)
   06/04/2023  Name: Phoenicia Pirie MRN: [id number] DOB: [birth year]  Today's TOC FU Call Status: Today's TOC FU Call Status:: Unsuccessful Call (2nd Attempt) Unsuccessful Call (2nd Attempt) Date: 06/04/23  Attempted to reach the patient regarding the most recent Inpatient/ED visit.  Follow Up Plan: Additional outreach attempts will be made to reach the patient to complete the Transitions of Care (Post Inpatient/ED visit) call.   Kieren Ricci J. Natasha Burda RN, MSN [facility], [facility] RN Care Manager Direct Dial: [phone]  Fax: [phone] Website: [email or web address]

## 2023-06-05 ENCOUNTER — Telehealth: Payer: Self-pay

## 2023-06-05 NOTE — Transitions of Care (Post Inpatient/ED Visit) (Signed)
   06/05/2023  Name: Nichole Munoz MRN: [id number] DOB: [birth year]  Today's TOC FU Call Status:    Attempted to reach the patient regarding the most recent Inpatient/ED visit.  Follow Up Plan: No further outreach attempts will be made at this time. We have been unable to contact the patient.  Marce Schartz J. Doloras Tellado RN, MSN [facility], [facility] RN Care Manager Direct Dial: [phone]  Fax: [phone] Website: [email or web address]

## 2023-07-04 DIAGNOSIS — M79604 Pain in right leg: Secondary | ICD-10-CM | POA: Diagnosis not present

## 2023-07-04 DIAGNOSIS — N1832 Chronic kidney disease, stage 3b: Secondary | ICD-10-CM | POA: Diagnosis not present

## 2023-08-03 DIAGNOSIS — N1832 Chronic kidney disease, stage 3b: Secondary | ICD-10-CM | POA: Diagnosis not present

## 2023-08-03 DIAGNOSIS — M79604 Pain in right leg: Secondary | ICD-10-CM | POA: Diagnosis not present

## 2023-08-14 DIAGNOSIS — E1142 Type 2 diabetes mellitus with diabetic polyneuropathy: Secondary | ICD-10-CM | POA: Diagnosis not present

## 2023-08-14 DIAGNOSIS — M47816 Spondylosis without myelopathy or radiculopathy, lumbar region: Secondary | ICD-10-CM | POA: Diagnosis not present

## 2023-08-14 DIAGNOSIS — R269 Unspecified abnormalities of gait and mobility: Secondary | ICD-10-CM | POA: Diagnosis not present

## 2023-08-14 DIAGNOSIS — Z8673 Personal history of transient ischemic attack (TIA), and cerebral infarction without residual deficits: Secondary | ICD-10-CM | POA: Diagnosis not present

## 2023-08-14 DIAGNOSIS — Z794 Long term (current) use of insulin: Secondary | ICD-10-CM | POA: Diagnosis not present

## 2023-08-14 DIAGNOSIS — Z0000 Encounter for general adult medical examination without abnormal findings: Secondary | ICD-10-CM | POA: Diagnosis not present

## 2023-08-14 DIAGNOSIS — E782 Mixed hyperlipidemia: Secondary | ICD-10-CM | POA: Diagnosis not present

## 2023-08-14 DIAGNOSIS — N1832 Chronic kidney disease, stage 3b: Secondary | ICD-10-CM | POA: Diagnosis not present

## 2023-08-14 DIAGNOSIS — J4530 Mild persistent asthma, uncomplicated: Secondary | ICD-10-CM | POA: Diagnosis not present

## 2023-08-14 DIAGNOSIS — I10 Essential (primary) hypertension: Secondary | ICD-10-CM | POA: Diagnosis not present

## 2023-08-25 DIAGNOSIS — N1832 Chronic kidney disease, stage 3b: Secondary | ICD-10-CM | POA: Diagnosis not present

## 2023-08-25 DIAGNOSIS — R251 Tremor, unspecified: Secondary | ICD-10-CM | POA: Diagnosis not present

## 2023-08-25 DIAGNOSIS — E1142 Type 2 diabetes mellitus with diabetic polyneuropathy: Secondary | ICD-10-CM | POA: Diagnosis not present

## 2023-08-25 DIAGNOSIS — Z8673 Personal history of transient ischemic attack (TIA), and cerebral infarction without residual deficits: Secondary | ICD-10-CM | POA: Diagnosis not present

## 2023-08-25 DIAGNOSIS — Z0001 Encounter for general adult medical examination with abnormal findings: Secondary | ICD-10-CM | POA: Diagnosis not present

## 2023-08-25 DIAGNOSIS — M47816 Spondylosis without myelopathy or radiculopathy, lumbar region: Secondary | ICD-10-CM | POA: Diagnosis not present

## 2023-08-25 DIAGNOSIS — J4530 Mild persistent asthma, uncomplicated: Secondary | ICD-10-CM | POA: Diagnosis not present

## 2023-08-25 DIAGNOSIS — R269 Unspecified abnormalities of gait and mobility: Secondary | ICD-10-CM | POA: Diagnosis not present

## 2023-08-25 DIAGNOSIS — I10 Essential (primary) hypertension: Secondary | ICD-10-CM | POA: Diagnosis not present

## 2023-08-25 DIAGNOSIS — E782 Mixed hyperlipidemia: Secondary | ICD-10-CM | POA: Diagnosis not present

## 2023-08-26 DIAGNOSIS — R54 Age-related physical debility: Secondary | ICD-10-CM | POA: Diagnosis not present

## 2023-08-26 DIAGNOSIS — Z7409 Other reduced mobility: Secondary | ICD-10-CM | POA: Diagnosis not present

## 2023-09-03 DIAGNOSIS — M79604 Pain in right leg: Secondary | ICD-10-CM | POA: Diagnosis not present

## 2023-09-03 DIAGNOSIS — N1832 Chronic kidney disease, stage 3b: Secondary | ICD-10-CM | POA: Diagnosis not present

## 2023-10-04 DIAGNOSIS — N1832 Chronic kidney disease, stage 3b: Secondary | ICD-10-CM | POA: Diagnosis not present

## 2023-11-03 DIAGNOSIS — M79604 Pain in right leg: Secondary | ICD-10-CM | POA: Diagnosis not present

## 2023-11-03 DIAGNOSIS — N1832 Chronic kidney disease, stage 3b: Secondary | ICD-10-CM | POA: Diagnosis not present
